# Patient Record
Sex: Female | Born: 1940 | Race: Black or African American | Hispanic: No | Smoking: Never smoker
Health system: Southern US, Community
[De-identification: ages and names within clinical notes are randomized; demographics above are authoritative.]

## PROBLEM LIST (undated history)

## (undated) DIAGNOSIS — IMO0001 Reserved for inherently not codable concepts without codable children: Secondary | ICD-10-CM

## (undated) DIAGNOSIS — I700 Atherosclerosis of aorta: Secondary | ICD-10-CM

## (undated) DIAGNOSIS — Z9289 Personal history of other medical treatment: Secondary | ICD-10-CM

## (undated) DIAGNOSIS — E1165 Type 2 diabetes mellitus with hyperglycemia: Secondary | ICD-10-CM

## (undated) DIAGNOSIS — E042 Nontoxic multinodular goiter: Secondary | ICD-10-CM

## (undated) DIAGNOSIS — R079 Chest pain, unspecified: Secondary | ICD-10-CM

## (undated) DIAGNOSIS — D649 Anemia, unspecified: Secondary | ICD-10-CM

## (undated) DIAGNOSIS — R519 Headache, unspecified: Secondary | ICD-10-CM

## (undated) DIAGNOSIS — I10 Essential (primary) hypertension: Secondary | ICD-10-CM

## (undated) DIAGNOSIS — Z923 Personal history of irradiation: Secondary | ICD-10-CM

## (undated) DIAGNOSIS — G4733 Obstructive sleep apnea (adult) (pediatric): Secondary | ICD-10-CM

## (undated) DIAGNOSIS — Z9989 Dependence on other enabling machines and devices: Secondary | ICD-10-CM

## (undated) DIAGNOSIS — R51 Headache: Secondary | ICD-10-CM

## (undated) DIAGNOSIS — I4510 Unspecified right bundle-branch block: Secondary | ICD-10-CM

## (undated) DIAGNOSIS — K219 Gastro-esophageal reflux disease without esophagitis: Secondary | ICD-10-CM

## (undated) DIAGNOSIS — C50919 Malignant neoplasm of unspecified site of unspecified female breast: Secondary | ICD-10-CM

## (undated) DIAGNOSIS — I739 Peripheral vascular disease, unspecified: Secondary | ICD-10-CM

## (undated) DIAGNOSIS — I2510 Atherosclerotic heart disease of native coronary artery without angina pectoris: Secondary | ICD-10-CM

## (undated) DIAGNOSIS — M1990 Unspecified osteoarthritis, unspecified site: Secondary | ICD-10-CM

## (undated) DIAGNOSIS — R911 Solitary pulmonary nodule: Secondary | ICD-10-CM

## (undated) DIAGNOSIS — G8929 Other chronic pain: Secondary | ICD-10-CM

## (undated) DIAGNOSIS — I779 Disorder of arteries and arterioles, unspecified: Secondary | ICD-10-CM

## (undated) DIAGNOSIS — E785 Hyperlipidemia, unspecified: Secondary | ICD-10-CM

## (undated) DIAGNOSIS — E0590 Thyrotoxicosis, unspecified without thyrotoxic crisis or storm: Secondary | ICD-10-CM

## (undated) DIAGNOSIS — E669 Obesity, unspecified: Secondary | ICD-10-CM

## (undated) HISTORY — PX: CARDIAC CATHETERIZATION: SHX172

## (undated) HISTORY — DX: Essential (primary) hypertension: I10

## (undated) HISTORY — DX: Nontoxic multinodular goiter: E04.2

## (undated) HISTORY — DX: Unspecified right bundle-branch block: I45.10

## (undated) HISTORY — PX: BREAST BIOPSY: SHX20

## (undated) HISTORY — DX: Hyperlipidemia, unspecified: E78.5

## (undated) HISTORY — DX: Obstructive sleep apnea (adult) (pediatric): G47.33

## (undated) HISTORY — DX: Gastro-esophageal reflux disease without esophagitis: K21.9

## (undated) HISTORY — DX: Unspecified osteoarthritis, unspecified site: M19.90

## (undated) HISTORY — DX: Dependence on other enabling machines and devices: Z99.89

## (undated) HISTORY — DX: Anemia, unspecified: D64.9

## (undated) HISTORY — DX: Malignant neoplasm of unspecified site of unspecified female breast: C50.919

## (undated) HISTORY — DX: Atherosclerosis of aorta: I70.0

## (undated) HISTORY — PX: BREAST EXCISIONAL BIOPSY: SUR124

## (undated) HISTORY — DX: Obesity, unspecified: E66.9

---

## 1997-03-20 DIAGNOSIS — Z923 Personal history of irradiation: Secondary | ICD-10-CM

## 1997-03-20 HISTORY — DX: Personal history of irradiation: Z92.3

## 1997-10-15 ENCOUNTER — Ambulatory Visit (HOSPITAL_COMMUNITY): Admission: RE | Admit: 1997-10-15 | Discharge: 1997-10-15 | Payer: Self-pay | Admitting: Endocrinology

## 1998-03-20 HISTORY — PX: MASTECTOMY: SHX3

## 1998-06-17 ENCOUNTER — Other Ambulatory Visit: Admission: RE | Admit: 1998-06-17 | Discharge: 1998-06-17 | Payer: Self-pay | Admitting: Obstetrics & Gynecology

## 1999-07-29 ENCOUNTER — Other Ambulatory Visit: Admission: RE | Admit: 1999-07-29 | Discharge: 1999-07-29 | Payer: Self-pay | Admitting: Obstetrics & Gynecology

## 1999-08-02 ENCOUNTER — Encounter: Admission: RE | Admit: 1999-08-02 | Discharge: 1999-08-02 | Payer: Self-pay | Admitting: Obstetrics & Gynecology

## 1999-08-02 ENCOUNTER — Encounter: Payer: Self-pay | Admitting: Obstetrics & Gynecology

## 1999-09-12 ENCOUNTER — Ambulatory Visit (HOSPITAL_COMMUNITY): Admission: RE | Admit: 1999-09-12 | Discharge: 1999-09-12 | Payer: Self-pay | Admitting: *Deleted

## 1999-09-12 ENCOUNTER — Encounter (INDEPENDENT_AMBULATORY_CARE_PROVIDER_SITE_OTHER): Payer: Self-pay

## 1999-11-28 ENCOUNTER — Encounter: Payer: Self-pay | Admitting: Internal Medicine

## 2000-03-06 ENCOUNTER — Encounter: Admission: RE | Admit: 2000-03-06 | Discharge: 2000-03-06 | Payer: Self-pay | Admitting: Internal Medicine

## 2000-03-06 ENCOUNTER — Encounter: Payer: Self-pay | Admitting: Internal Medicine

## 2000-04-18 ENCOUNTER — Encounter: Payer: Self-pay | Admitting: Endocrinology

## 2000-04-18 ENCOUNTER — Ambulatory Visit (HOSPITAL_COMMUNITY): Admission: RE | Admit: 2000-04-18 | Discharge: 2000-04-18 | Payer: Self-pay | Admitting: Endocrinology

## 2000-09-10 ENCOUNTER — Other Ambulatory Visit: Admission: RE | Admit: 2000-09-10 | Discharge: 2000-09-10 | Payer: Self-pay | Admitting: Obstetrics and Gynecology

## 2000-11-13 ENCOUNTER — Emergency Department (HOSPITAL_COMMUNITY): Admission: EM | Admit: 2000-11-13 | Discharge: 2000-11-13 | Payer: Self-pay | Admitting: Internal Medicine

## 2000-11-13 ENCOUNTER — Encounter: Payer: Self-pay | Admitting: Emergency Medicine

## 2001-04-19 ENCOUNTER — Encounter (INDEPENDENT_AMBULATORY_CARE_PROVIDER_SITE_OTHER): Payer: Self-pay | Admitting: Specialist

## 2001-04-19 ENCOUNTER — Ambulatory Visit (HOSPITAL_COMMUNITY): Admission: RE | Admit: 2001-04-19 | Discharge: 2001-04-19 | Payer: Self-pay | Admitting: *Deleted

## 2001-09-11 ENCOUNTER — Other Ambulatory Visit: Admission: RE | Admit: 2001-09-11 | Discharge: 2001-09-11 | Payer: Self-pay | Admitting: Obstetrics and Gynecology

## 2001-09-23 ENCOUNTER — Encounter: Admission: RE | Admit: 2001-09-23 | Discharge: 2001-09-23 | Payer: Self-pay | Admitting: Obstetrics and Gynecology

## 2001-09-23 ENCOUNTER — Encounter: Payer: Self-pay | Admitting: Obstetrics and Gynecology

## 2002-04-03 ENCOUNTER — Encounter: Payer: Self-pay | Admitting: Internal Medicine

## 2002-04-03 ENCOUNTER — Ambulatory Visit (HOSPITAL_COMMUNITY): Admission: RE | Admit: 2002-04-03 | Discharge: 2002-04-03 | Payer: Self-pay | Admitting: Internal Medicine

## 2002-09-16 ENCOUNTER — Other Ambulatory Visit: Admission: RE | Admit: 2002-09-16 | Discharge: 2002-09-16 | Payer: Self-pay | Admitting: Obstetrics and Gynecology

## 2003-02-23 ENCOUNTER — Ambulatory Visit (HOSPITAL_COMMUNITY): Admission: RE | Admit: 2003-02-23 | Discharge: 2003-02-23 | Payer: Self-pay | Admitting: *Deleted

## 2003-09-30 ENCOUNTER — Encounter: Admission: RE | Admit: 2003-09-30 | Discharge: 2003-09-30 | Payer: Self-pay | Admitting: Obstetrics and Gynecology

## 2004-02-03 ENCOUNTER — Ambulatory Visit (HOSPITAL_COMMUNITY): Admission: RE | Admit: 2004-02-03 | Discharge: 2004-02-03 | Payer: Self-pay | Admitting: Internal Medicine

## 2004-04-12 ENCOUNTER — Ambulatory Visit (HOSPITAL_COMMUNITY): Admission: RE | Admit: 2004-04-12 | Discharge: 2004-04-12 | Payer: Self-pay | Admitting: *Deleted

## 2004-09-21 ENCOUNTER — Other Ambulatory Visit: Admission: RE | Admit: 2004-09-21 | Discharge: 2004-09-21 | Payer: Self-pay | Admitting: Obstetrics and Gynecology

## 2004-12-02 ENCOUNTER — Ambulatory Visit (HOSPITAL_COMMUNITY): Admission: RE | Admit: 2004-12-02 | Discharge: 2004-12-02 | Payer: Self-pay | Admitting: *Deleted

## 2005-01-25 ENCOUNTER — Ambulatory Visit (HOSPITAL_COMMUNITY): Admission: RE | Admit: 2005-01-25 | Discharge: 2005-01-25 | Payer: Self-pay | Admitting: Internal Medicine

## 2005-02-28 ENCOUNTER — Other Ambulatory Visit: Admission: RE | Admit: 2005-02-28 | Discharge: 2005-02-28 | Payer: Self-pay | Admitting: Interventional Radiology

## 2005-02-28 ENCOUNTER — Encounter: Admission: RE | Admit: 2005-02-28 | Discharge: 2005-02-28 | Payer: Self-pay | Admitting: Internal Medicine

## 2005-02-28 ENCOUNTER — Encounter (INDEPENDENT_AMBULATORY_CARE_PROVIDER_SITE_OTHER): Payer: Self-pay | Admitting: *Deleted

## 2005-04-21 ENCOUNTER — Encounter: Admission: RE | Admit: 2005-04-21 | Discharge: 2005-04-21 | Payer: Self-pay | Admitting: Obstetrics and Gynecology

## 2005-08-17 ENCOUNTER — Ambulatory Visit (HOSPITAL_COMMUNITY): Admission: RE | Admit: 2005-08-17 | Discharge: 2005-08-17 | Payer: Self-pay | Admitting: Internal Medicine

## 2005-09-28 ENCOUNTER — Other Ambulatory Visit: Admission: RE | Admit: 2005-09-28 | Discharge: 2005-09-28 | Payer: Self-pay | Admitting: Obstetrics and Gynecology

## 2005-10-02 ENCOUNTER — Ambulatory Visit (HOSPITAL_COMMUNITY): Admission: RE | Admit: 2005-10-02 | Discharge: 2005-10-02 | Payer: Self-pay | Admitting: Obstetrics and Gynecology

## 2005-10-03 ENCOUNTER — Encounter (INDEPENDENT_AMBULATORY_CARE_PROVIDER_SITE_OTHER): Payer: Self-pay | Admitting: Specialist

## 2005-10-03 ENCOUNTER — Encounter: Admission: RE | Admit: 2005-10-03 | Discharge: 2005-10-03 | Payer: Self-pay | Admitting: Endocrinology

## 2005-10-03 ENCOUNTER — Other Ambulatory Visit: Admission: RE | Admit: 2005-10-03 | Discharge: 2005-10-03 | Payer: Self-pay | Admitting: Diagnostic Radiology

## 2005-10-09 ENCOUNTER — Encounter: Admission: RE | Admit: 2005-10-09 | Discharge: 2005-10-09 | Payer: Self-pay | Admitting: Obstetrics and Gynecology

## 2005-11-27 ENCOUNTER — Encounter: Admission: RE | Admit: 2005-11-27 | Discharge: 2005-11-27 | Payer: Self-pay | Admitting: Internal Medicine

## 2006-01-24 ENCOUNTER — Encounter: Admission: RE | Admit: 2006-01-24 | Discharge: 2006-01-24 | Payer: Self-pay | Admitting: Internal Medicine

## 2006-04-23 ENCOUNTER — Encounter: Admission: RE | Admit: 2006-04-23 | Discharge: 2006-04-23 | Payer: Self-pay | Admitting: Obstetrics and Gynecology

## 2006-10-29 ENCOUNTER — Encounter: Admission: RE | Admit: 2006-10-29 | Discharge: 2006-10-29 | Payer: Self-pay | Admitting: Internal Medicine

## 2007-05-07 ENCOUNTER — Encounter: Admission: RE | Admit: 2007-05-07 | Discharge: 2007-05-07 | Payer: Self-pay | Admitting: Obstetrics and Gynecology

## 2007-07-09 ENCOUNTER — Ambulatory Visit (HOSPITAL_COMMUNITY): Admission: RE | Admit: 2007-07-09 | Discharge: 2007-07-09 | Payer: Self-pay | Admitting: *Deleted

## 2007-07-23 ENCOUNTER — Ambulatory Visit (HOSPITAL_BASED_OUTPATIENT_CLINIC_OR_DEPARTMENT_OTHER): Admission: RE | Admit: 2007-07-23 | Discharge: 2007-07-23 | Payer: Self-pay | Admitting: Orthopedic Surgery

## 2007-07-29 ENCOUNTER — Encounter: Admission: RE | Admit: 2007-07-29 | Discharge: 2007-07-29 | Payer: Self-pay | Admitting: Internal Medicine

## 2007-08-15 ENCOUNTER — Encounter (INDEPENDENT_AMBULATORY_CARE_PROVIDER_SITE_OTHER): Payer: Self-pay | Admitting: Interventional Radiology

## 2007-08-15 ENCOUNTER — Other Ambulatory Visit: Admission: RE | Admit: 2007-08-15 | Discharge: 2007-08-15 | Payer: Self-pay | Admitting: Interventional Radiology

## 2007-08-15 ENCOUNTER — Encounter: Payer: Self-pay | Admitting: Internal Medicine

## 2007-08-15 ENCOUNTER — Encounter: Admission: RE | Admit: 2007-08-15 | Discharge: 2007-08-15 | Payer: Self-pay | Admitting: Internal Medicine

## 2008-02-06 ENCOUNTER — Emergency Department (HOSPITAL_COMMUNITY): Admission: EM | Admit: 2008-02-06 | Discharge: 2008-02-06 | Payer: Self-pay | Admitting: Emergency Medicine

## 2008-02-07 ENCOUNTER — Inpatient Hospital Stay (HOSPITAL_COMMUNITY): Admission: EM | Admit: 2008-02-07 | Discharge: 2008-02-11 | Payer: Self-pay | Admitting: Emergency Medicine

## 2008-05-08 ENCOUNTER — Encounter: Admission: RE | Admit: 2008-05-08 | Discharge: 2008-05-08 | Payer: Self-pay | Admitting: Obstetrics and Gynecology

## 2008-05-22 ENCOUNTER — Encounter: Payer: Self-pay | Admitting: Internal Medicine

## 2008-08-14 ENCOUNTER — Encounter: Payer: Self-pay | Admitting: Internal Medicine

## 2008-08-18 ENCOUNTER — Encounter: Payer: Self-pay | Admitting: Internal Medicine

## 2008-10-27 ENCOUNTER — Encounter: Admission: RE | Admit: 2008-10-27 | Discharge: 2008-10-27 | Payer: Self-pay | Admitting: Endocrinology

## 2008-10-30 ENCOUNTER — Ambulatory Visit (HOSPITAL_COMMUNITY): Admission: RE | Admit: 2008-10-30 | Discharge: 2008-10-30 | Payer: Self-pay | Admitting: Rheumatology

## 2008-11-12 ENCOUNTER — Ambulatory Visit: Payer: Self-pay | Admitting: Internal Medicine

## 2008-11-12 DIAGNOSIS — N289 Disorder of kidney and ureter, unspecified: Secondary | ICD-10-CM | POA: Insufficient documentation

## 2008-11-12 DIAGNOSIS — E1169 Type 2 diabetes mellitus with other specified complication: Secondary | ICD-10-CM | POA: Insufficient documentation

## 2008-11-12 DIAGNOSIS — R911 Solitary pulmonary nodule: Secondary | ICD-10-CM | POA: Insufficient documentation

## 2008-11-12 DIAGNOSIS — E119 Type 2 diabetes mellitus without complications: Secondary | ICD-10-CM | POA: Insufficient documentation

## 2008-11-12 DIAGNOSIS — E118 Type 2 diabetes mellitus with unspecified complications: Secondary | ICD-10-CM | POA: Insufficient documentation

## 2008-11-12 DIAGNOSIS — K219 Gastro-esophageal reflux disease without esophagitis: Secondary | ICD-10-CM | POA: Insufficient documentation

## 2008-11-12 DIAGNOSIS — M1990 Unspecified osteoarthritis, unspecified site: Secondary | ICD-10-CM | POA: Insufficient documentation

## 2008-11-12 DIAGNOSIS — E785 Hyperlipidemia, unspecified: Secondary | ICD-10-CM | POA: Insufficient documentation

## 2008-11-12 DIAGNOSIS — I10 Essential (primary) hypertension: Secondary | ICD-10-CM | POA: Insufficient documentation

## 2008-11-12 LAB — CONVERTED CEMR LAB
BUN: 30 mg/dL — ABNORMAL HIGH (ref 6–23)
CO2: 28 meq/L (ref 19–32)
Calcium: 9.9 mg/dL (ref 8.4–10.5)
Chloride: 102 meq/L (ref 96–112)
Creatinine, Ser: 1 mg/dL (ref 0.4–1.2)
GFR calc non Af Amer: 71.29 mL/min (ref >60)
Glucose, Bld: 150 mg/dL — ABNORMAL HIGH (ref 70–99)
Potassium: 4.4 meq/L (ref 3.5–5.1)
Sodium: 138 meq/L (ref 135–145)

## 2008-11-17 ENCOUNTER — Encounter: Payer: Self-pay | Admitting: Internal Medicine

## 2008-12-04 ENCOUNTER — Ambulatory Visit: Payer: Self-pay | Admitting: Oncology

## 2008-12-07 ENCOUNTER — Ambulatory Visit: Payer: Self-pay | Admitting: Cardiology

## 2008-12-10 ENCOUNTER — Ambulatory Visit: Payer: Self-pay | Admitting: Internal Medicine

## 2009-01-05 ENCOUNTER — Ambulatory Visit: Payer: Self-pay | Admitting: Pulmonary Disease

## 2009-01-05 DIAGNOSIS — G4733 Obstructive sleep apnea (adult) (pediatric): Secondary | ICD-10-CM | POA: Insufficient documentation

## 2009-01-11 ENCOUNTER — Ambulatory Visit: Payer: Self-pay | Admitting: Oncology

## 2009-01-14 LAB — CBC & DIFF AND RETIC
BASO%: 0.4 % (ref 0.0–2.0)
Basophils Absolute: 0 10*3/uL (ref 0.0–0.1)
EOS%: 0.4 % (ref 0.0–7.0)
Eosinophils Absolute: 0 10*3/uL (ref 0.0–0.5)
HCT: 29.2 % — ABNORMAL LOW (ref 34.8–46.6)
HGB: 9.3 g/dL — ABNORMAL LOW (ref 11.6–15.9)
Immature Retic Fract: 14.2 % — ABNORMAL HIGH (ref 0.00–10.70)
LYMPH%: 29 % (ref 14.0–49.7)
MCH: 24.8 pg — ABNORMAL LOW (ref 25.1–34.0)
MCHC: 31.8 g/dL (ref 31.5–36.0)
MCV: 77.9 fL — ABNORMAL LOW (ref 79.5–101.0)
MONO#: 0.5 10*3/uL (ref 0.1–0.9)
MONO%: 8.2 % (ref 0.0–14.0)
NEUT#: 3.4 10*3/uL (ref 1.5–6.5)
NEUT%: 62 % (ref 38.4–76.8)
Platelets: 289 10*3/uL (ref 145–400)
RBC: 3.75 10*6/uL (ref 3.70–5.45)
RDW: 16.7 % — ABNORMAL HIGH (ref 11.2–14.5)
Retic %: 0.99 % (ref 0.50–1.50)
Retic Ct Abs: 37.13 10*3/uL (ref 18.30–72.70)
WBC: 5.5 10*3/uL (ref 3.9–10.3)
lymph#: 1.6 10*3/uL (ref 0.9–3.3)

## 2009-01-14 LAB — COMPREHENSIVE METABOLIC PANEL
ALT: 18 U/L (ref 0–35)
AST: 21 U/L (ref 0–37)
Albumin: 4 g/dL (ref 3.5–5.2)
Alkaline Phosphatase: 76 U/L (ref 39–117)
BUN: 18 mg/dL (ref 6–23)
CO2: 29 mEq/L (ref 19–32)
Calcium: 10.4 mg/dL (ref 8.4–10.5)
Chloride: 107 mEq/L (ref 96–112)
Creatinine, Ser: 0.91 mg/dL (ref 0.40–1.20)
Glucose, Bld: 70 mg/dL (ref 70–99)
Potassium: 3.6 mEq/L (ref 3.5–5.3)
Sodium: 143 mEq/L (ref 135–145)
Total Bilirubin: 0.6 mg/dL (ref 0.3–1.2)
Total Protein: 7.5 g/dL (ref 6.0–8.3)

## 2009-01-14 LAB — LACTATE DEHYDROGENASE: LDH: 164 U/L (ref 94–250)

## 2009-01-14 LAB — CHCC SMEAR

## 2009-01-18 LAB — IMMUNOFIXATION ELECTROPHORESIS
IgA: 227 mg/dL (ref 68–378)
IgG (Immunoglobin G), Serum: 1170 mg/dL (ref 694–1618)
IgM, Serum: 49 mg/dL — ABNORMAL LOW (ref 60–263)
Total Protein, Serum Electrophoresis: 7.3 g/dL (ref 6.0–8.3)

## 2009-01-18 LAB — IRON AND TIBC
%SAT: 5 % — ABNORMAL LOW (ref 20–55)
Iron: 19 ug/dL — ABNORMAL LOW (ref 42–145)
TIBC: 412 ug/dL (ref 250–470)
UIBC: 393 ug/dL

## 2009-01-18 LAB — FERRITIN: Ferritin: 9 ng/mL — ABNORMAL LOW (ref 10–291)

## 2009-01-18 LAB — TRANSFERRIN RECEPTOR, SOLUABLE: Transferrin Receptor, Soluble: 45.5 nmol/L

## 2009-02-01 LAB — FECAL OCCULT BLOOD, GUAIAC: Occult Blood: NEGATIVE

## 2009-02-15 ENCOUNTER — Encounter: Payer: Self-pay | Admitting: Pulmonary Disease

## 2009-02-17 ENCOUNTER — Ambulatory Visit: Payer: Self-pay | Admitting: Oncology

## 2009-02-19 LAB — CBC WITH DIFFERENTIAL/PLATELET
BASO%: 0.2 % (ref 0.0–2.0)
Basophils Absolute: 0 10*3/uL (ref 0.0–0.1)
EOS%: 0.7 % (ref 0.0–7.0)
Eosinophils Absolute: 0 10*3/uL (ref 0.0–0.5)
HCT: 31.1 % — ABNORMAL LOW (ref 34.8–46.6)
HGB: 9.8 g/dL — ABNORMAL LOW (ref 11.6–15.9)
LYMPH%: 30.8 % (ref 14.0–49.7)
MCH: 25.2 pg (ref 25.1–34.0)
MCHC: 31.5 g/dL (ref 31.5–36.0)
MCV: 79.9 fL (ref 79.5–101.0)
MONO#: 0.4 10*3/uL (ref 0.1–0.9)
MONO%: 6.2 % (ref 0.0–14.0)
NEUT#: 3.5 10*3/uL (ref 1.5–6.5)
NEUT%: 62.1 % (ref 38.4–76.8)
Platelets: 298 10*3/uL (ref 145–400)
RBC: 3.89 10*6/uL (ref 3.70–5.45)
RDW: 19 % — ABNORMAL HIGH (ref 11.2–14.5)
WBC: 5.7 10*3/uL (ref 3.9–10.3)
lymph#: 1.8 10*3/uL (ref 0.9–3.3)

## 2009-02-19 LAB — SEDIMENTATION RATE: Sed Rate: 28 mm/hr — ABNORMAL HIGH (ref 0–22)

## 2009-02-24 ENCOUNTER — Encounter: Payer: Self-pay | Admitting: Pulmonary Disease

## 2009-02-25 ENCOUNTER — Ambulatory Visit (HOSPITAL_BASED_OUTPATIENT_CLINIC_OR_DEPARTMENT_OTHER): Admission: RE | Admit: 2009-02-25 | Discharge: 2009-02-25 | Payer: Self-pay | Admitting: Pulmonary Disease

## 2009-02-25 ENCOUNTER — Encounter: Payer: Self-pay | Admitting: Pulmonary Disease

## 2009-03-04 ENCOUNTER — Ambulatory Visit: Payer: Self-pay | Admitting: Pulmonary Disease

## 2009-03-08 ENCOUNTER — Ambulatory Visit: Payer: Self-pay | Admitting: Internal Medicine

## 2009-03-23 ENCOUNTER — Ambulatory Visit (HOSPITAL_COMMUNITY): Admission: RE | Admit: 2009-03-23 | Discharge: 2009-03-23 | Payer: Self-pay | Admitting: Rheumatology

## 2009-03-31 ENCOUNTER — Ambulatory Visit: Payer: Self-pay | Admitting: Oncology

## 2009-04-02 LAB — CBC WITH DIFFERENTIAL/PLATELET
BASO%: 0.2 % (ref 0.0–2.0)
Basophils Absolute: 0 10*3/uL (ref 0.0–0.1)
EOS%: 1.8 % (ref 0.0–7.0)
Eosinophils Absolute: 0.1 10*3/uL (ref 0.0–0.5)
HCT: 34.6 % — ABNORMAL LOW (ref 34.8–46.6)
HGB: 11 g/dL — ABNORMAL LOW (ref 11.6–15.9)
LYMPH%: 34.2 % (ref 14.0–49.7)
MCH: 26.9 pg (ref 25.1–34.0)
MCHC: 31.8 g/dL (ref 31.5–36.0)
MCV: 84.6 fL (ref 79.5–101.0)
MONO#: 0.5 10*3/uL (ref 0.1–0.9)
MONO%: 9 % (ref 0.0–14.0)
NEUT#: 2.8 10*3/uL (ref 1.5–6.5)
NEUT%: 54.8 % (ref 38.4–76.8)
Platelets: 291 10*3/uL (ref 145–400)
RBC: 4.09 10*6/uL (ref 3.70–5.45)
RDW: 20.3 % — ABNORMAL HIGH (ref 11.2–14.5)
WBC: 5.1 10*3/uL (ref 3.9–10.3)
lymph#: 1.8 10*3/uL (ref 0.9–3.3)

## 2009-04-06 LAB — FERRITIN: Ferritin: 15 ng/mL (ref 10–291)

## 2009-04-06 LAB — IRON AND TIBC
%SAT: 15 % — ABNORMAL LOW (ref 20–55)
Iron: 61 ug/dL (ref 42–145)
TIBC: 397 ug/dL (ref 250–470)
UIBC: 336 ug/dL

## 2009-04-06 LAB — TRANSFERRIN RECEPTOR, SOLUABLE: Transferrin Receptor, Soluble: 32.4 nmol/L

## 2009-05-10 ENCOUNTER — Encounter: Admission: RE | Admit: 2009-05-10 | Discharge: 2009-05-10 | Payer: Self-pay | Admitting: Obstetrics and Gynecology

## 2009-05-18 ENCOUNTER — Encounter: Payer: Self-pay | Admitting: Internal Medicine

## 2009-05-26 ENCOUNTER — Ambulatory Visit: Payer: Self-pay | Admitting: Oncology

## 2009-05-28 LAB — CBC WITH DIFFERENTIAL/PLATELET
BASO%: 0.2 % (ref 0.0–2.0)
Basophils Absolute: 0 10*3/uL (ref 0.0–0.1)
EOS%: 0.7 % (ref 0.0–7.0)
Eosinophils Absolute: 0 10*3/uL (ref 0.0–0.5)
HCT: 34.4 % — ABNORMAL LOW (ref 34.8–46.6)
HGB: 11.2 g/dL — ABNORMAL LOW (ref 11.6–15.9)
LYMPH%: 30.7 % (ref 14.0–49.7)
MCH: 28.4 pg (ref 25.1–34.0)
MCHC: 32.6 g/dL (ref 31.5–36.0)
MCV: 87.1 fL (ref 79.5–101.0)
MONO#: 0.4 10*3/uL (ref 0.1–0.9)
MONO%: 7 % (ref 0.0–14.0)
NEUT#: 3.5 10*3/uL (ref 1.5–6.5)
NEUT%: 61.4 % (ref 38.4–76.8)
Platelets: 261 10*3/uL (ref 145–400)
RBC: 3.95 10*6/uL (ref 3.70–5.45)
RDW: 16.4 % — ABNORMAL HIGH (ref 11.2–14.5)
WBC: 5.6 10*3/uL (ref 3.9–10.3)
lymph#: 1.7 10*3/uL (ref 0.9–3.3)
nRBC: 0 % (ref 0–0)

## 2009-05-28 LAB — IRON AND TIBC
%SAT: 15 % — ABNORMAL LOW (ref 20–55)
Iron: 61 ug/dL (ref 42–145)
TIBC: 411 ug/dL (ref 250–470)
UIBC: 350 ug/dL

## 2009-05-28 LAB — FERRITIN: Ferritin: 19 ng/mL (ref 10–291)

## 2009-06-08 ENCOUNTER — Ambulatory Visit: Payer: Self-pay | Admitting: Pulmonary Disease

## 2009-06-15 ENCOUNTER — Encounter: Payer: Self-pay | Admitting: Internal Medicine

## 2009-06-23 ENCOUNTER — Ambulatory Visit: Payer: Self-pay | Admitting: Cardiology

## 2009-07-07 ENCOUNTER — Ambulatory Visit (HOSPITAL_COMMUNITY): Admission: RE | Admit: 2009-07-07 | Discharge: 2009-07-07 | Payer: Self-pay | Admitting: Internal Medicine

## 2009-07-21 ENCOUNTER — Ambulatory Visit: Payer: Self-pay | Admitting: Internal Medicine

## 2009-07-22 ENCOUNTER — Ambulatory Visit: Payer: Self-pay | Admitting: Oncology

## 2009-07-23 LAB — COMPREHENSIVE METABOLIC PANEL
ALT: 17 U/L (ref 0–35)
AST: 17 U/L (ref 0–37)
Albumin: 4.3 g/dL (ref 3.5–5.2)
Alkaline Phosphatase: 94 U/L (ref 39–117)
BUN: 27 mg/dL — ABNORMAL HIGH (ref 6–23)
CO2: 25 mEq/L (ref 19–32)
Calcium: 10.3 mg/dL (ref 8.4–10.5)
Chloride: 104 mEq/L (ref 96–112)
Creatinine, Ser: 0.92 mg/dL (ref 0.40–1.20)
Glucose, Bld: 112 mg/dL — ABNORMAL HIGH (ref 70–99)
Potassium: 4.7 mEq/L (ref 3.5–5.3)
Sodium: 141 mEq/L (ref 135–145)
Total Bilirubin: 0.4 mg/dL (ref 0.3–1.2)
Total Protein: 7.5 g/dL (ref 6.0–8.3)

## 2009-07-23 LAB — IRON AND TIBC
%SAT: 20 % (ref 20–55)
Iron: 70 ug/dL (ref 42–145)
TIBC: 358 ug/dL (ref 250–470)
UIBC: 288 ug/dL

## 2009-07-23 LAB — CBC WITH DIFFERENTIAL/PLATELET
BASO%: 0.2 % (ref 0.0–2.0)
Basophils Absolute: 0 10*3/uL (ref 0.0–0.1)
EOS%: 4.5 % (ref 0.0–7.0)
Eosinophils Absolute: 0.3 10*3/uL (ref 0.0–0.5)
HCT: 35.4 % (ref 34.8–46.6)
HGB: 11.4 g/dL — ABNORMAL LOW (ref 11.6–15.9)
LYMPH%: 25 % (ref 14.0–49.7)
MCH: 28.3 pg (ref 25.1–34.0)
MCHC: 32.2 g/dL (ref 31.5–36.0)
MCV: 87.8 fL (ref 79.5–101.0)
MONO#: 0.4 10*3/uL (ref 0.1–0.9)
MONO%: 7 % (ref 0.0–14.0)
NEUT#: 3.7 10*3/uL (ref 1.5–6.5)
NEUT%: 63.3 % (ref 38.4–76.8)
Platelets: 273 10*3/uL (ref 145–400)
RBC: 4.03 10*6/uL (ref 3.70–5.45)
RDW: 14.9 % — ABNORMAL HIGH (ref 11.2–14.5)
WBC: 5.8 10*3/uL (ref 3.9–10.3)
lymph#: 1.5 10*3/uL (ref 0.9–3.3)
nRBC: 0 % (ref 0–0)

## 2009-07-23 LAB — FERRITIN: Ferritin: 33 ng/mL (ref 10–291)

## 2009-07-23 LAB — LACTATE DEHYDROGENASE: LDH: 225 U/L (ref 94–250)

## 2009-09-21 ENCOUNTER — Ambulatory Visit: Payer: Self-pay | Admitting: Oncology

## 2009-09-23 LAB — CBC WITH DIFFERENTIAL/PLATELET
BASO%: 0.2 % (ref 0.0–2.0)
Basophils Absolute: 0 10*3/uL (ref 0.0–0.1)
EOS%: 2.7 % (ref 0.0–7.0)
Eosinophils Absolute: 0.1 10*3/uL (ref 0.0–0.5)
HCT: 35.8 % (ref 34.8–46.6)
HGB: 11.6 g/dL (ref 11.6–15.9)
LYMPH%: 30.5 % (ref 14.0–49.7)
MCH: 28.4 pg (ref 25.1–34.0)
MCHC: 32.4 g/dL (ref 31.5–36.0)
MCV: 87.5 fL (ref 79.5–101.0)
MONO#: 0.5 10*3/uL (ref 0.1–0.9)
MONO%: 8.8 % (ref 0.0–14.0)
NEUT#: 3 10*3/uL (ref 1.5–6.5)
NEUT%: 57.8 % (ref 38.4–76.8)
Platelets: 258 10*3/uL (ref 145–400)
RBC: 4.09 10*6/uL (ref 3.70–5.45)
RDW: 14.9 % — ABNORMAL HIGH (ref 11.2–14.5)
WBC: 5.2 10*3/uL (ref 3.9–10.3)
lymph#: 1.6 10*3/uL (ref 0.9–3.3)
nRBC: 0 % (ref 0–0)

## 2009-09-23 LAB — IRON AND TIBC
%SAT: 19 % — ABNORMAL LOW (ref 20–55)
Iron: 70 ug/dL (ref 42–145)
TIBC: 371 ug/dL (ref 250–470)
UIBC: 301 ug/dL

## 2009-09-23 LAB — FERRITIN: Ferritin: 30 ng/mL (ref 10–291)

## 2009-11-17 ENCOUNTER — Ambulatory Visit: Payer: Self-pay | Admitting: Pulmonary Disease

## 2009-11-17 DIAGNOSIS — E669 Obesity, unspecified: Secondary | ICD-10-CM | POA: Insufficient documentation

## 2009-11-18 ENCOUNTER — Ambulatory Visit: Payer: Self-pay | Admitting: Oncology

## 2009-11-23 LAB — CBC WITH DIFFERENTIAL/PLATELET
BASO%: 0.3 % (ref 0.0–2.0)
Basophils Absolute: 0 10*3/uL (ref 0.0–0.1)
EOS%: 1.5 % (ref 0.0–7.0)
Eosinophils Absolute: 0.1 10*3/uL (ref 0.0–0.5)
HCT: 33.4 % — ABNORMAL LOW (ref 34.8–46.6)
HGB: 11.2 g/dL — ABNORMAL LOW (ref 11.6–15.9)
LYMPH%: 30 % (ref 14.0–49.7)
MCH: 29.5 pg (ref 25.1–34.0)
MCHC: 33.7 g/dL (ref 31.5–36.0)
MCV: 87.7 fL (ref 79.5–101.0)
MONO#: 0.3 10*3/uL (ref 0.1–0.9)
MONO%: 7.1 % (ref 0.0–14.0)
NEUT#: 2.9 10*3/uL (ref 1.5–6.5)
NEUT%: 61.1 % (ref 38.4–76.8)
Platelets: 263 10*3/uL (ref 145–400)
RBC: 3.81 10*6/uL (ref 3.70–5.45)
RDW: 15.1 % — ABNORMAL HIGH (ref 11.2–14.5)
WBC: 4.7 10*3/uL (ref 3.9–10.3)
lymph#: 1.4 10*3/uL (ref 0.9–3.3)

## 2009-11-23 LAB — COMPREHENSIVE METABOLIC PANEL
ALT: 10 U/L (ref 0–35)
AST: 14 U/L (ref 0–37)
Albumin: 4.1 g/dL (ref 3.5–5.2)
Alkaline Phosphatase: 74 U/L (ref 39–117)
BUN: 21 mg/dL (ref 6–23)
CO2: 27 mEq/L (ref 19–32)
Calcium: 9.9 mg/dL (ref 8.4–10.5)
Chloride: 104 mEq/L (ref 96–112)
Creatinine, Ser: 0.95 mg/dL (ref 0.40–1.20)
Glucose, Bld: 91 mg/dL (ref 70–99)
Potassium: 3.9 mEq/L (ref 3.5–5.3)
Sodium: 139 mEq/L (ref 135–145)
Total Bilirubin: 0.4 mg/dL (ref 0.3–1.2)
Total Protein: 6.8 g/dL (ref 6.0–8.3)

## 2009-11-23 LAB — IRON AND TIBC
%SAT: 19 % — ABNORMAL LOW (ref 20–55)
Iron: 65 ug/dL (ref 42–145)
TIBC: 350 ug/dL (ref 250–470)
UIBC: 285 ug/dL

## 2009-11-23 LAB — FERRITIN: Ferritin: 22 ng/mL (ref 10–291)

## 2009-11-23 LAB — LACTATE DEHYDROGENASE: LDH: 179 U/L (ref 94–250)

## 2010-01-04 ENCOUNTER — Ambulatory Visit (HOSPITAL_BASED_OUTPATIENT_CLINIC_OR_DEPARTMENT_OTHER): Admission: RE | Admit: 2010-01-04 | Discharge: 2010-01-04 | Payer: Self-pay | Admitting: Orthopedic Surgery

## 2010-02-25 ENCOUNTER — Ambulatory Visit: Payer: Self-pay | Admitting: Internal Medicine

## 2010-03-01 ENCOUNTER — Encounter: Payer: Self-pay | Admitting: Internal Medicine

## 2010-04-09 ENCOUNTER — Other Ambulatory Visit: Payer: Self-pay | Admitting: Obstetrics and Gynecology

## 2010-04-09 DIAGNOSIS — Z1239 Encounter for other screening for malignant neoplasm of breast: Secondary | ICD-10-CM

## 2010-04-10 ENCOUNTER — Encounter: Payer: Self-pay | Admitting: Internal Medicine

## 2010-04-11 ENCOUNTER — Encounter: Payer: Self-pay | Admitting: Internal Medicine

## 2010-04-21 NOTE — Assessment & Plan Note (Signed)
Summary: review PET scan//jrc   Visit Type:  Follow-up Copy to:  Brand Males Primary Provider/Referring Provider:  Dr. Deland Pretty  CC:  pt here to discuss PET scna results. .  History of Present Illness: OV 07/21/2009: Followup LUL  Pulmonary nodule in 70 year old ex-passive smoker with prior breast cancer. First noted 08/14/2008 as incidental finding on a [facility] reserach study scan. Confirmed as unchanged 12/07/2008 (4th month scan). Unchanged on followup CT 03/08/2009 but also present was subcentimeter mediastinal nodes (7th month scan). Had another followup CT chest 06/23/2009 (11th month scan). This showed enlargement of the LUL nodule to 1.3cm (in retrospect dec 2010 nodule measured at 1.2cm) but unchanged mediastoinal nodes. PET scan done 07/07/2009 shows NO UPTAKE in mediastinum or LUL nodule.   In terms of symptoms she is asymptomatic. No complaints.   Current Medications (verified): 1)  Caduet 10-80 Mg Tabs (Amlodipine-Atorvastatin) .... Take 1 Tablet By Mouth Once A Day 2)  Zetia 10 Mg Tabs (Ezetimibe) .... Take 1 Tablet By Mouth Once A Day 3)  Lisinopril-Hydrochlorothiazide 20-12.5 Mg Tabs (Lisinopril-Hydrochlorothiazide) .... Take 1 Tablet By Mouth Once A Day 4)  Aspirin 81 Mg Tbec (Aspirin) .... Take 1 Tablet By Mouth Once A Day 5)  Glucotrol Xl 10 Mg Xr24h-Tab (Glipizide) .... Take 1 Tablet By Mouth Two Times A Day 6)  Actoplus Met 15-850 Mg Tabs (Pioglitazone Hcl-Metformin Hcl) .... Take 1 Tablet By Mouth Two Times A Day 7)  Omeprazole 20 Mg Cpdr (Omeprazole) .... Take 1 Tablet By Mouth Once A Day 8)  Osteo Bi-Flex Joint Shield  Tabs (Misc Natural Products) .... Take 1 Tablet By Mouth Two Times A Day 9)  Calcium-Vitamin D 1200-400 Mg-Unit Tab .... Take 1 Tablet By Mouth Two Times A Day 10)  Tylenol 325 Mg Tabs (Acetaminophen) .... Per Bottle As Needed 11)  Fish Oil 1000 Mg Caps (Omega-3 Fatty Acids) .... Take 1 Tablet By Mouth Once A Day 12)  Miralax  Powd (Polyethylene Glycol  3350) .... 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  Walks occ. for exercise PASSIVE SMOKING - brith through marriage -> her mom smoked. 71 years of marriage - husband smoked heavily  Risk Factors: Smoking Status: never (01/05/2009)  Past Medical History: Diabetes, Type 2 Hyperlipidemia Hypertension Obstructive Sleep Apnea      - CPAP 15 cm H2O Multinodular Goiter with calcification with Left > Right on CT April 2011 with contralateral tracheal deviation Breast cancer s/p left mastectomy Chronic Anemia Atherosclerosis of aorta Obesity G E R D Right Bundle Branch Block Osteoarthritis Superior end plate compression fracture at L5 with normal SPEP-2009  Past Surgical History: Reviewed history from 11/12/2008 and no changes required. Mastectomy-left Tubal Ligation  Family History: Reviewed history from 11/12/2008 and no changes required. Mother-MI at age 30 Father-deceased at 55, unknown causes 41 bothers,three sisters 1 Sister-Diabetes 1Brother-diabetes 2 Brother- diabetes 3 Brother-diabetes, HTN, heart disease, stroke  Social History: Reviewed history from 11/12/2008 and no changes required. 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  Walks occ. for exercise PASSIVE SMOKING - brith through marriage -> her mom smoked. 29 years of marriage - husband smoked heavily  Review of Systems  The patient denies shortness of breath with activity, shortness of breath at rest, productive cough, non-productive cough, coughing up blood, chest pain, irregular  heartbeats, acid heartburn, indigestion, loss of appetite, weight change, abdominal pain, difficulty swallowing, sore throat, tooth/dental problems, headaches, nasal congestion/difficulty breathing through nose, sneezing, itching, ear ache, anxiety, depression, hand/feet swelling, joint stiffness or pain, rash, change in color of mucus, and fever.    Vital Signs:  Patient profile:   70 year old female Height:      62 inches Weight:      196 pounds O2 Sat:      96 % on Room air Temp:     97.6 degrees F oral Pulse rate:   77 / minute BP sitting:   120 / 72  (right arm) Cuff size:   regular  Vitals Entered By: [place] Bing CMA (Jul 21, 2009 3:22 PM)  O2 Flow:  Room air CC: pt here to discuss PET scna results.  Comments Medications reviewed with patient [place] Bing CMA  Jul 21, 2009 3:24 PM Daytime phone number verified with patient.    Physical Exam  General:  obese.   Head:  normocephalic and atraumatic Eyes:  PERRLA/EOM intact; conjunctiva and sclera clear Ears:  TMs intact and clear with normal canals Nose:  no deformity, discharge, inflammation, or lesions Mouth:  MP 3, no oral lesions Neck:  no JVD.   Chest Wall:  no deformities noted Lungs:  clear bilaterally to auscultation and percussion Heart:  regular rate and rhythm, S1, S2 without murmurs, rubs, gallops, or clicks Abdomen:  bowel sounds positive; abdomen soft and non-tender without masses, or organomegaly Msk:  no deformity or scoliosis noted with normal posture Pulses:  pulses normal Extremities:  no clubbing, cyanosis, edema, or deformity noted Neurologic:  CN II-XII grossly intact with normal reflexes, coordination, muscle strength and tone Skin:  intact without lesions or rashes Cervical Nodes:  no significant adenopathy Axillary Nodes:  no significant adenopathy Psych:  alert and cooperative; normal mood and affect; normal attention span and concentration   CT of Chest  Procedure date:   06/23/2009  Findings:      First noted 08/14/2008 as incidental finding on a [facility] reserach study scan. Confirmed as unchanged 12/07/2008 (4th month scan). Unchanged on followup CT 03/08/2009 but also present was subcentimeter mediastinal nodes (7th month scan). Had another followup CT chest 06/23/2009 (11th month scan). This showed enlargement of the LUL nodule to 1.3cm (in retrospect dec 2010 nodule measured at 1.2cm) but unchanged mediastoinal nodes. PET scan done 07/07/2009 shows NO UPTAKE in mediastinum or LUL nodule.   Comments:      independently reviewed  Impression & Recommendations:  Problem # 1:  PULMONARY NODULE (ICD-518.89) Assessment Unchanged  Orders: Radiology Referral (Radiology) Est. Patient Level III [id number]  STable LUL 1.2cm nodule 08/14/2008 -> 12/07/2008  -> 03/08/2009 -> 06/23/2009. NegativePET scan 07/07/2009  Overall very very low prob for malignancy  plan repeat ct in 6 months to complete 18th month CT She will call for results. Over phone if she continues to be asymptomatic and nodule is unchanged we will order the 24th months can without her coming in for office visit Radiology Referral (Radiology)  Medications Added to Medication List This Visit: 1)  Calcium-vitamin D 1200-400 Mg-unit Tab  .... Take 1 tablet by mouth two times a day  Patient Instructions: 1)  have another ct chest in 6-7 months  2)  This scan will be the 18th month  scan 3)  Call us to review report over phone after you have the scan

## 2010-04-21 NOTE — Miscellaneous (Signed)
Summary: CT without contrast in 6 mths and rov after  Clinical Lists Changes  Orders: Added new Referral order of Radiology Referral (Radiology) - Signed

## 2010-04-21 NOTE — Assessment & Plan Note (Signed)
Summary: osa per dr mr   Copy to:  Brand Males Primary Provider/Referring Provider:  Dr. Deland Pretty  CC:  Sleep consult per Dr. Chase Caller.  Epworth score is 19.Marland Kitchen  History of Present Illness: 70 yo female for sleep evaluation.  She had a sleep test 7 years ago, and was diagnosed with sleep apnea.  This was done in [place].  She has been on CPAP since then without change in her set up.  She has a nasal mask with a chin strap and humidifer.  She does not have any problem with her mask.  She has not received any new supplies for years.  She is using her CPAP about 4 out of 7 nights per week.  She goes to bed at 9pm, and falls asleep after about 30 minutes.  She wakes up a few times during the night to use the bathroom.  She wakes up at 8am, but lays in bed for about 2 more hours.  She sometimes feel sleepy in the morning, and is more groggy during the day if she does not use her CPAP the night before.  She still snores even when she uses her mask.  She does not use anything to help her sleep or stay awake.  She denies sleep walking, sleep talking, or nightmares.  She denies sleep hallucinations, sleep paralysis, or cataplexy.  She denies restless legs.  Preventive Screening-Counseling & Management  Alcohol-Tobacco     Smoking Status: never  Current Medications (verified): 1)  Caduet 10-80 Mg Tabs (Amlodipine-Atorvastatin) .... Take 1 Tablet By Mouth Once A Day 2)  Zetia 10 Mg Tabs (Ezetimibe) .... Take 1 Tablet By Mouth Once A Day 3)  Miralax  Powd (Polyethylene Glycol 3350) .... As Needed 4)  Lisinopril-Hydrochlorothiazide 20-12.5 Mg Tabs (Lisinopril-Hydrochlorothiazide) .... Take 1 Tablet By Mouth Once A Day 5)  Calcium-Vitamin D 250-125 Mg-Unit Tabs (Calcium Carbonate-Vitamin D) .... Take 1 Tablet By Mouth Two Times A Day 6)  Aspirin 81 Mg Tbec (Aspirin) .... Take 1 Tablet By Mouth Once A Day 7)  Glucotrol Xl 10 Mg Xr24h-Tab (Glipizide) .... Take 1 Tablet By Mouth Two Times  A Day 8)  Actoplus Met 15-850 Mg Tabs (Pioglitazone Hcl-Metformin Hcl) .... Take 1 Tablet By Mouth Two Times A Day 9)  Omeprazole 20 Mg Cpdr (Omeprazole) .... Take 1 Tablet By Mouth Once A Day 10)  Osteo Bi-Flex Joint Shield  Tabs (Misc Natural Products) .... Take 1 Tablet By Mouth Two Times A Day 11)  Tylenol 325 Mg Tabs (Acetaminophen) .... Per Bottle As Needed 12)  Fish Oil 1000 Mg Caps (Omega-3 Fatty Acids) .... Take 1 Tablet By Mouth Once A Day  Allergies (verified): 1)  ! 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 Mastectomy-left Tubal Ligation  Family History: Reviewed history from 11/12/2008 and no changes required. Mother-MI at age 67 Father-deceased at 33, unknown causes 27 bothers,three sisters 1 Sister-Diabetes 1Brother-diabetes 2 Brother- diabetes 3 Brother-diabetes, HTN, heart disease, stroke  Social History: Reviewed history from 11/12/2008 and no changes required. 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  Walks occ. for exercise PASSIVE SMOKING - brith through marriage -> her mom smoked.  64 years of marriage - husband smoked heavily  Vital Signs:  Patient profile:   70 year old female Height:      62 inches (157.48 cm) Weight:      195.13 pounds (88.70 kg) BMI:     35.82 O2 Sat:      97 % on Room air Temp:     97.6 degrees F (36.44 degrees C) oral Pulse rate:   88 / minute BP sitting:   130 / 60  (right arm) Cuff size:   regular  Vitals Entered By: Francesca Jewett CMA (January 05, 2009 10:36 AM)  O2 Flow:  Room air CC: Sleep consult  per Dr. Chase Caller.  Epworth score is 19.   Physical Exam  General:  obese.   Eyes:  PERRLA/EOM intact; conjunctiva and sclera clear Ears:  TMs intact and clear with normal canals Nose:  no deformity, discharge, inflammation, or lesions Mouth:  MP 3, no oral lesions Neck:  no JVD.   Lungs:  clear bilaterally to auscultation and percussion Heart:  regular rate and rhythm, S1, S2 without murmurs, rubs, gallops, or clicks Abdomen:  bowel sounds positive; abdomen soft and non-tender without masses, or organomegaly Pulses:  pulses normal Extremities:  no clubbing, cyanosis, edema, or deformity noted Neurologic:  CN II-XII grossly intact with normal reflexes, coordination, muscle strength and tone Cervical Nodes:  no significant adenopathy Psych:  alert and cooperative; normal mood and affect; normal attention span and concentration   Impression & Recommendations:  Problem # 1:  OBSTRUCTIVE SLEEP APNEA (ICD-327.23) She has a prior diagnosis of sleep apnea and has been on the same settings as her initial set up.  I will try to locate her DME company, which she had listed as [employer], and arrange for an auto-CPAP titration as she likely needs an adjustment in her pressure setting.  She will also need to be supplied with new equipment.  In addition I have explained the diagnosis of sleep apnea, and how this can affect her health and function.  This is of particular importance given that she is on multiple anti-hypertensive medications.  Driving precautions and the benefits of weight loss were reviewed.  Alternative treatment options for her sleep apnea were discussed.  I have asked her to try to locate a copy of her original sleep test for our records.  I have also discussed proper sleep hygiene, and emphasized the importance of using her CPAP machine for the entire time she is asleep.  Complete Medication List: 1)  Caduet 10-80 Mg Tabs (Amlodipine-atorvastatin) .... Take 1 tablet by  mouth once a day 2)  Zetia 10 Mg Tabs (Ezetimibe) .... Take 1 tablet by mouth once a day 3)  Lisinopril-hydrochlorothiazide 20-12.5 Mg Tabs (Lisinopril-hydrochlorothiazide) .... Take 1 tablet by mouth once a day 4)  Aspirin 81 Mg Tbec (Aspirin) .... Take 1 tablet by mouth once a day 5)  Glucotrol Xl 10 Mg Xr24h-tab (Glipizide) .... Take 1 tablet by mouth two times a day 6)  Actoplus Met 15-850 Mg Tabs (Pioglitazone hcl-metformin hcl) .... Take 1 tablet by mouth two times a day 7)  Omeprazole 20 Mg Cpdr (Omeprazole) .... Take 1 tablet by mouth once a day 8)  Osteo Bi-flex Joint Shield Tabs (Misc natural products) .... Take 1 tablet by mouth two times a day 9)  Calcium-vitamin D 250-125 Mg-unit Tabs (Calcium carbonate-vitamin d) .... Take 1 tablet by mouth two times a day 10)  Tylenol 325 Mg Tabs (Acetaminophen) .... Per bottle  as needed 11)  Fish Oil 1000 Mg Caps (Omega-3 fatty acids) .... Take 1 tablet by mouth once a day 12)  Miralax Powd (Polyethylene glycol 3350) .... As needed  Other Orders: Est. Patient Level IV [id number]) DME Referral (DME)  Patient Instructions: 1)  Will get report from CPAP machine  2)  Follow up in 2 to 3 months

## 2010-04-21 NOTE — Assessment & Plan Note (Signed)
Summary: LUNG NODULE ///kp   Visit Type:  Initial Consult Copy to:  Dr. Deland Pretty Primary Provider/Referring Provider:  Dr. Deland Pretty  CC:  Pulmonary Consult for abnormal CT scan. Marland Kitchen  History of Present Illness: IOV 11/12/2008: 70 year old ex-passive smoker, prior breast cancer and diabetic. Is at baseline health. Is part of a [facility] reserach study on diabetes. On 08/14/2008 underwent CT chest. Only report availabe for review. Reported to show LUL 8mm nodule, thryroid nodule, 59mm left kidney lesion. THerefore, has been referred here. No acute symptoms.  Preventive Screening-Counseling & Management  Alcohol-Tobacco     Smoking Status: never  Current Medications (verified): 1)  Caduet 10-80 Mg Tabs (Amlodipine-Atorvastatin) .... Take 1 Tablet By Mouth Once A Day 2)  Zetia 10 Mg Tabs (Ezetimibe) .... Take 1 Tablet By Mouth Once A Day 3)  Miralax  Powd (Polyethylene Glycol 3350) .... As Needed 4)  Lisinopril-Hydrochlorothiazide 20-12.5 Mg Tabs (Lisinopril-Hydrochlorothiazide) .... Take 1 Tablet By Mouth Once A Day 5)  Calcium-Vitamin D 250-125 Mg-Unit Tabs (Calcium Carbonate-Vitamin D) .... Take 1 Tablet By Mouth Two Times A Day 6)  Aspirin 81 Mg Tbec (Aspirin) .... Take 1 Tablet By Mouth Once A Day 7)  Glucotrol Xl 10 Mg Xr24h-Tab (Glipizide) .... Take 1 Tablet By Mouth Two Times A Day 8)  Actoplus Met 15-850 Mg Tabs (Pioglitazone Hcl-Metformin Hcl) .... Take 1 Tablet By Mouth Two Times A Day 9)  Protonix 40 Mg Tbec (Pantoprazole Sodium) .... Take 1 Tablet By Mouth Once A Day 10)  Osteo Bi-Flex Joint Shield  Tabs (Misc Natural Products) .... Take 1 Tablet By Mouth Two Times A Day 11)  Tylenol 325 Mg Tabs (Acetaminophen) .... Per Bottle As Needed 12)  Fish Oil 1000 Mg Caps (Omega-3 Fatty Acids) .... Take 1 Tablet By Mouth Once A Day  Allergies (verified): 1)  ! 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  Walks occ. for exercise PASSIVE SMOKING - brith through marriage -> her mom smoked. 6 years of marriage - husband smoked heavily  Risk Factors: Smoking Status: never (11/12/2008)  Past Medical History: Diabetes, Type 2 Hyperlipidemia Hypertension Sleep Apnea Multinodular Goiter Breast cancer s/p left mastectomy Chronic Anemia Atherosclerosis of aorta Obesity G E R D Right Bundle Branch Block Osteoarthritis Hx of superior end plate compression fracture at L5 with normal SPEP-2009 Labs drawn 08/18/08 from Dr. 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  Walks occ. for exercise PASSIVE SMOKING - brith through marriage -> her mom smoked. 70 years of marriage - husband smoked heavilySmoking Status:  never  Review of Systems       The patient complains of acid heartburn and joint stiffness or pain.  The patient denies shortness of breath with activity, shortness of breath at rest, productive cough, non-productive cough, coughing up blood, chest pain, irregular heartbeats, indigestion, loss of appetite, weight change, abdominal pain, difficulty swallowing, sore throat, tooth/dental problems, headaches, nasal congestion/difficulty breathing through nose, sneezing, itching, ear ache, anxiety, depression, hand/feet swelling, rash, change in color of mucus, and fever.    Vital Signs:  Patient profile:  70 year old female Height:      62 inches Weight:      190.25 pounds BMI:     34.92 O2 Sat:      100 % on Room air Temp:     98.2 degrees F oral Pulse  rate:   64 / minute BP sitting:   118 / 74  (right arm) Cuff size:   regular  Vitals Entered By: [place] Bing CMA (November 12, 2008 11:25 AM)  O2 Flow:  Room air CC: Pulmonary Consult for abnormal CT scan.  Comments Medications reviewed with patient [place] Bing CMA  November 12, 2008 11:27 AM    Physical Exam  General:  well developed, well nourished, in no acute distressobese.   Head:  normocephalic and atraumatic Eyes:  PERRLA/EOM intact; conjunctiva and sclera clear Ears:  TMs intact and clear with normal canals Nose:  no deformity, discharge, inflammation, or lesions Mouth:  no deformity or lesions Neck:  no masses, thyromegaly, or abnormal cervical nodes Chest Wall:  no deformities noted Lungs:  clear bilaterally to auscultation and percussion Heart:  regular rate and rhythm, S1, S2 without murmurs, rubs, gallops, or clicks Abdomen:  bowel sounds positive; abdomen soft and non-tender without masses, or organomegaly Msk:  no deformity or scoliosis noted with normal posture Pulses:  pulses normal Extremities:  no clubbing, cyanosis, edema, or deformity noted Neurologic:  CN II-XII grossly intact with normal reflexes, coordination, muscle strength and tone Skin:  intact without lesions or rashes Cervical Nodes:  no significant adenopathy Axillary Nodes:  no significant adenopathy Psych:  alert and cooperative; normal mood and affect; normal attention span and concentration   Impression & Recommendations:  Problem # 1:  PULMONARY NODULE (ICD-518.89) Assessment New 70 year old, passive smoker, reported to have LUL 14mm nodule in research Ct 08/14/2008  plan do ct chest now (will be the 3rd month ct) Orders: TLB-BMP (Basic Metabolic Panel-BMET) ([id number]) Radiology Referral (Radiology) Consultation Level IV [id number])  Problem # 2:  UNSPECIFIED DISORDER OF KIDNEY AND URETER (ICD-593.9) Assessment: New get ct abdomen pelvis for 69mm renal nodule seen on  08/14/2008 abd ct done at [facility] Orders: Radiology Referral (Radiology) Consultation Level IV [phone])  Patient Instructions: 1)  have ct scan chest, abdomen and pelvis 2)  return to see me after above  Appended Document: LUNG NODULE ///kp i GOT call on friday 8/27 I tink from [place] [facility]. They could not get IV. So, they are asking for PICC line. Per the tech who called me. She said that the radiologist said, it was imperative to give this patient a contrst for the scan. I do not know what the difficulty was. I would get her to CT at [facility] and hve the tech there try before getting PICC line. Please execute this  Appended Document: LUNG NODULE ///kp sent flag to [facility] to have Ct rescheduled at [facility].

## 2010-04-21 NOTE — Miscellaneous (Signed)
Summary: Orders Update   Clinical Lists Changes  Orders: Added new Referral order of Radiology Referral (Radiology) - Signed 

## 2010-04-21 NOTE — Letter (Signed)
Summary: [facility]   Imported By: Phillis Knack 06/24/2009 10:32:33  _____________________________________________________________________  External Attachment:    Type:   Image     Comment:   External Document

## 2010-04-21 NOTE — Assessment & Plan Note (Signed)
Summary: rov per checkout 11/12/08/mhh   Visit Type:  Follow-up Copy to:  Dr. Deland Pretty Primary Provider/Referring Provider:  Dr. Deland Pretty  CC:  Review CT results. .  History of Present Illness: IOV 11/12/2008: 70 year old ex-passive smoker, prior breast cancer and diabetic. Is at baseline health. Is part of a [facility] reserach study on diabetes. On 08/14/2008 underwent CT chest. Only report availabe for review. Reported to show LUL 17mm nodule, thryroid nodule, 75mm left kidney lesion. THerefore, has been referred here. No acute symptoms. REC: Do CT CHEST, ABD, PELVIS in [facility]  OV 12/10/2008. REturns for followup after scans. I personally reivwed the scanfrom 12/07/2008 (4 month scan) and confirm the official report. There is non calcified 78mm LUL nodule in the lung. The abdominal CT shows renal cysts. No other abnormalities. She continues to be asymptomatic.    Current Medications (verified): 1)  Caduet 10-80 Mg Tabs (Amlodipine-Atorvastatin) .... Take 1 Tablet By Mouth Once A Day 2)  Zetia 10 Mg Tabs (Ezetimibe) .... Take 1 Tablet By Mouth Once A Day 3)  Miralax  Powd (Polyethylene Glycol 3350) .... As Needed 4)  Lisinopril-Hydrochlorothiazide 20-12.5 Mg Tabs (Lisinopril-Hydrochlorothiazide) .... Take 1 Tablet By Mouth Once A Day 5)  Calcium-Vitamin D 250-125 Mg-Unit Tabs (Calcium Carbonate-Vitamin D) .... Take 1 Tablet By Mouth Two Times A Day 6)  Aspirin 81 Mg Tbec (Aspirin) .... Take 1 Tablet By Mouth Once A Day 7)  Glucotrol Xl 10 Mg Xr24h-Tab (Glipizide) .... Take 1 Tablet By Mouth Two Times A Day 8)  Actoplus Met 15-850 Mg Tabs (Pioglitazone Hcl-Metformin Hcl) .... Take 1 Tablet By Mouth Two Times A Day 9)  Omeprazole 20 Mg Cpdr (Omeprazole) .... Take 1 Tablet By Mouth Once A Day 10)  Osteo Bi-Flex Joint Shield  Tabs (Misc Natural Products) .... Take 1 Tablet By Mouth Two Times A Day 11)  Tylenol 325 Mg Tabs (Acetaminophen) .... Per Bottle As Needed 12)  Fish Oil 1000  Mg Caps (Omega-3 Fatty Acids) .... Take 1 Tablet By Mouth Once A Day  Allergies (verified): 1)  ! * Tramadol  Past History:  Past Medical History: Reviewed history from 11/12/2008 and no changes required. Diabetes, Type 2 Hyperlipidemia Hypertension Sleep Apnea Multinodular Goiter Breast cancer s/p left mastectomy Chronic Anemia Atherosclerosis of aorta Obesity G E R D Right Bundle Branch Block Osteoarthritis Hx of superior end plate compression fracture at L5 with normal SPEP-2009 Labs drawn 08/18/08 from Dr. Shelia Media: Creatinine 1.1, Albumin 3.9  Past Surgical History: Reviewed history from 11/12/2008 and no changes required. Mastectomy-left Tubal Ligation  Family History: Reviewed history from 11/12/2008 and no changes required. Mother-MI at age 57 Father-deceased at 36, unknown causes 23 bothers,three sisters 1 Sister-Diabetes 1Brother-diabetes 2 Brother- diabetes 3 Brother-diabetes, HTN, heart disease, stroke  Social History: Reviewed history from 11/12/2008 and no changes required. 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 Wine Patient never smoked.  Walks occ. for exercise PASSIVE SMOKING - brith through marriage -> her mom smoked. 21 years of marriage - husband smoked heavily  Review of Systems      See HPI  Vital Signs:  Patient profile:   70 year old female Height:      62 inches Weight:      193.13 pounds O2 Sat:      98 % on Room air Temp:     98.3 degrees F oral Pulse rate:   79 / minute BP sitting:   120 / 70  (right arm)  Cuff size:   regular  Vitals Entered By: Stewart Bing CMA (December 10, 2008 2:28 PM)  O2 Flow:  Room air CC: Review CT results.  Comments Medications reviewed with patient [place] Bing CMA  December 10, 2008 2:30 PM    Physical Exam  General:  well developed, well nourished, in no acute distressobese.   Head:  normocephalic and atraumatic Eyes:  PERRLA/EOM intact; conjunctiva and sclera  clear Ears:  TMs intact and clear with normal canals Nose:  no deformity, discharge, inflammation, or lesions Mouth:  no deformity or lesions Neck:  no masses, thyromegaly, or abnormal cervical nodes Chest Wall:  no deformities noted Lungs:  clear bilaterally to auscultation and percussion Heart:  regular rate and rhythm, S1, S2 without murmurs, rubs, gallops, or clicks Abdomen:  bowel sounds positive; abdomen soft and non-tender without masses, or organomegaly Msk:  no deformity or scoliosis noted with normal posture Pulses:  pulses normal Extremities:  no clubbing, cyanosis, edema, or deformity noted Neurologic:  CN II-XII grossly intact with normal reflexes, coordination, muscle strength and tone Skin:  intact without lesions or rashes Cervical Nodes:  no significant adenopathy Axillary Nodes:  no significant adenopathy Psych:  alert and cooperative; normal mood and affect; normal attention span and concentration   CT of Chest  Procedure date:  12/07/2008  Findings:      .  No clear evidence of thoracic metastasis. 2.  Solitary 8 mm pulmonary nodule.  Comparison with remote CT would be helpful.  If unavailable, recommend either a short interval noncontrast CT follow-up (3 months), percutaneous biopsy, or PET CT scan. 1.  No evidence of abdominal metastasis. 2.  Right simple renal cyst.  Likely left simple renal cyst.    Comments:      personally reviwed scan and agree  Impression & Recommendations:  Problem # 1:  PULMONARY NODULE (ICD-518.89) Assessment Unchanged STable LUL 22mm nodule 08/14/2008 -> 12/07/2008   plan repeat ct in 3 months Orders: Radiology Referral (Radiology) Est. Patient Level II [id number])  Medications Added to Medication List This Visit: 1)  Omeprazole 20 Mg Cpdr (Omeprazole) .... Take 1 tablet by mouth once a day  Other Orders: Sleep Disorder Referral (Sleep Disorder)  Patient Instructions: 1)  have repeat ct chest on or after 03/08/2009 2)   have flu shot asap 3)  return after ct chest 4)  see DR. Sood or [place] for sleep disorders breathing

## 2010-04-21 NOTE — Assessment & Plan Note (Signed)
Summary: rov ///kp   Visit Type:  Follow-up Copy to:  Brand Males Primary Provider/Referring Provider:  Dr. Deland Pretty  CC:  6 mo OSA follow-up.  The patient says she has not used her cpap for 1 week because it is not working properly.  She sleeps well when the machine works right..  History of Present Illness: 70 yo female with OSA on CPAP 15 cm H2O.  Her machine stopped working about one week ago.  She doesn't feel like it is giving pressure, and the humidifer isn't working.  As a result she has not been using her machine, and her sleep is worse.  Prior to this she felt that CPAP helped.  She did not have any trouble with her mask.  Current Medications (verified): 1)  Caduet 10-80 Mg Tabs (Amlodipine-Atorvastatin) .... Take 1 Tablet By Mouth Once A Day 2)  Zetia 10 Mg Tabs (Ezetimibe) .... Take 1 Tablet By Mouth Once A Day 3)  Lisinopril-Hydrochlorothiazide 20-12.5 Mg Tabs (Lisinopril-Hydrochlorothiazide) .... Take 1 Tablet By Mouth Once A Day 4)  Aspirin 81 Mg Tbec (Aspirin) .... Take 1 Tablet By Mouth Once A Day 5)  Glucotrol Xl 10 Mg Xr24h-Tab (Glipizide) .... Take 1 Tablet By Mouth Two Times A Day 6)  Actoplus Met 15-850 Mg Tabs (Pioglitazone Hcl-Metformin Hcl) .... Take 1 Tablet By Mouth Two Times A Day 7)  Omeprazole 20 Mg Cpdr (Omeprazole) .... Take 1 Tablet By Mouth Once A Day 8)  Osteo Bi-Flex Joint Shield  Tabs (Misc Natural Products) .... Take 1 Tablet By Mouth Two Times A Day 9)  Calcium-Vitamin D 1200-400 Mg-Unit Tab .... Take 1 Tablet By Mouth Two Times A Day 10)  Tylenol 325 Mg Tabs (Acetaminophen) .... Per Bottle As Needed 11)  Fish Oil 1000 Mg Caps (Omega-3 Fatty Acids) .... Take 1 Tablet By Mouth Once A Day 12)  Miralax  Powd (Polyethylene Glycol 3350) .... As Needed  Allergies (verified): No Known Drug Allergies  Past History:  Past Medical History: Reviewed history from 07/21/2009 and no changes required. Diabetes, Type  2 Hyperlipidemia Hypertension Obstructive Sleep Apnea      - CPAP 15 cm H2O Multinodular Goiter with calcification with Left > Right on CT April 2011 with contralateral tracheal deviation Breast cancer s/p left mastectomy Chronic Anemia Atherosclerosis of aorta Obesity G E R D Right Bundle Branch Block Osteoarthritis Superior end plate compression fracture at L5 with normal SPEP-2009  Past Surgical History: Reviewed history from 11/12/2008 and no changes required. Mastectomy-left Tubal Ligation  Review of Systems  The patient denies shortness of breath at rest, productive cough, non-productive cough, chest pain, acid heartburn, abdominal pain, sore throat, headaches, nasal congestion/difficulty breathing through nose, and hand/feet swelling.    Vital Signs:  Patient profile:   70 year old female Height:      62 inches (157.48 cm) Weight:      198.38 pounds (90.17 kg) BMI:     36.42 O2 Sat:      97 % on Room air Temp:     98.1 degrees F (36.72 degrees C) oral Pulse rate:   70 / minute BP sitting:   120 / 72  (right arm) Cuff size:   large  Vitals Entered By: Francesca Jewett CMA (November 17, 2009 9:50 AM)  O2 Sat at Rest %:  97 O2 Flow:  Room air CC: 6 mo OSA follow-up.  The patient says she has not used her cpap for 1 week because it is not  working properly.  She sleeps well when the machine works right. Comments Medications reviewed with the patient. Daytime phone verified. Francesca Jewett [facility]  November 17, 2009 9:57 AM   Physical Exam  General:  obese.   Nose:  no deformity, discharge, inflammation, or lesions Mouth:  MP 3, no oral lesions Neck:  no JVD.   Lungs:  clear bilaterally to auscultation and percussion Heart:  regular rate and rhythm, S1, S2 without murmurs, rubs, gallops, or clicks Extremities:  no clubbing, cyanosis, edema, or deformity noted Cervical Nodes:  no significant adenopathy Psych:  alert and cooperative; normal mood and affect; normal attention  span and concentration   Impression & Recommendations:  Problem # 1:  OBSTRUCTIVE SLEEP APNEA (ICD-327.23)  Will have her DME make sure her machine is working appropriately.    Problem # 2:  OBESITY (ICD-278.00) Encouraged her to start a diet and exercise regimen.  Complete Medication List: 1)  Caduet 10-80 Mg Tabs (Amlodipine-atorvastatin) .... Take 1 tablet by mouth once a day 2)  Zetia 10 Mg Tabs (Ezetimibe) .... Take 1 tablet by mouth once a day 3)  Lisinopril-hydrochlorothiazide 20-12.5 Mg Tabs (Lisinopril-hydrochlorothiazide) .... Take 1 tablet by mouth once a day 4)  Aspirin 81 Mg Tbec (Aspirin) .... Take 1 tablet by mouth once a day 5)  Glucotrol Xl 10 Mg Xr24h-tab (Glipizide) .... Take 1 tablet by mouth two times a day 6)  Actoplus Met 15-850 Mg Tabs (Pioglitazone hcl-metformin hcl) .... Take 1 tablet by mouth two times a day 7)  Omeprazole 20 Mg Cpdr (Omeprazole) .... Take 1 tablet by mouth once a day 8)  Osteo Bi-flex Joint Shield Tabs (Misc natural products) .... Take 1 tablet by mouth two times a day 9)  Calcium-vitamin D 1200-400 Mg-unit Tab  .... Take 1 tablet by mouth two times a day 10)  Tylenol 325 Mg Tabs (Acetaminophen) .... Per bottle as needed 11)  Fish Oil 1000 Mg Caps (Omega-3 fatty acids) .... Take 1 tablet by mouth once a day 12)  Miralax Powd (Polyethylene glycol 3350) .... As needed  Other Orders: Est. Patient Level III [id number]) DME Referral (DME)  Patient Instructions: 1)  Follow up in 6 months

## 2010-04-21 NOTE — Miscellaneous (Signed)
Summary: auto CPAP report 01/20/09 to 02/15/09  Clinical Lists Changes Optimal pressure 11 cm H2O with average AHI 19.3.  Used on 25 of 27 days with average of 6hrs 11min.  No significant leak.  Discussed results with patient.  Will need to proceed with in lab CPAP titration.  Will cancel her ROV for later this month, and schedule her for follow up after review of her CPAP titration. Orders: Added new Referral order of Sleep Disorder Referral (Sleep Disorder) - Signed

## 2010-04-21 NOTE — Assessment & Plan Note (Signed)
Summary: 3 months w/cpap/apc   Copy to:  Brand Males Primary Provider/Referring Provider:  Dr. Deland Pretty  CC:  OSA.  CPAP follow-up. The patient says she is doing well on CPAP.Marland Kitchen  History of Present Illness: 70 yo female with OSA on CPAP 15 cm H2O.  She has been doing well on her CPAP set up.  She has a full face mask, and denies any problems with the mask.   She goes to bed at 10pm, and falls asleep quickly.  She uses the bathroom once per night.  She gets up at 7 am, and feels good.  She feels well rested through out the day.  She denies sinus congestion, sore throat, or leg swelling.   Current Medications (verified): 1)  Caduet 10-80 Mg Tabs (Amlodipine-Atorvastatin) .... Take 1 Tablet By Mouth Once A Day 2)  Zetia 10 Mg Tabs (Ezetimibe) .... Take 1 Tablet By Mouth Once A Day 3)  Lisinopril-Hydrochlorothiazide 20-12.5 Mg Tabs (Lisinopril-Hydrochlorothiazide) .... Take 1 Tablet By Mouth Once A Day 4)  Aspirin 81 Mg Tbec (Aspirin) .... Take 1 Tablet By Mouth Once A Day 5)  Glucotrol Xl 10 Mg Xr24h-Tab (Glipizide) .... Take 1 Tablet By Mouth Two Times A Day 6)  Actoplus Met 15-850 Mg Tabs (Pioglitazone Hcl-Metformin Hcl) .... Take 1 Tablet By Mouth Two Times A Day 7)  Omeprazole 20 Mg Cpdr (Omeprazole) .... Take 1 Tablet By Mouth Once A Day 8)  Osteo Bi-Flex Joint Shield  Tabs (Misc Natural Products) .... Take 1 Tablet By Mouth Two Times A Day 9)  Calcium-Vitamin D 250-125 Mg-Unit Tabs (Calcium Carbonate-Vitamin D) .... Take 1 Tablet By Mouth Two Times A Day 10)  Tylenol 325 Mg Tabs (Acetaminophen) .... Per Bottle As Needed 11)  Fish Oil 1000 Mg Caps (Omega-3 Fatty Acids) .... Take 1 Tablet By Mouth Once A Day 12)  Miralax  Powd (Polyethylene Glycol 3350) .... As Needed  Allergies (verified): 1)  ! * Tramadol  Past History:  Past Medical History: Diabetes, Type 2 Hyperlipidemia Hypertension Obstructive Sleep Apnea      - CPAP 15 cm H2O Multinodular Goiter Breast  cancer s/p left mastectomy Chronic Anemia Atherosclerosis of aorta Obesity G E R D Right Bundle Branch Block Osteoarthritis Superior end plate compression fracture at L5 with normal SPEP-2009  Past Surgical History: Reviewed history from 11/12/2008 and no changes required. Mastectomy-left Tubal Ligation  Vital Signs:  Patient profile:   70 year old female Height:      62 inches (157.48 cm) Weight:      199 pounds (90.45 kg) BMI:     36.53 O2 Sat:      98 % on Room air Temp:     97.8 degrees F (36.56 degrees C) oral Pulse rate:   88 / minute BP sitting:   120 / 72  (right arm) Cuff size:   regular  Vitals Entered By: Francesca Jewett CMA (June 08, 2009 9:12 AM)  O2 Sat at Rest %:  98 O2 Flow:  Room air  Physical Exam  General:  obese.   Nose:  no deformity, discharge, inflammation, or lesions Mouth:  MP 3, no oral lesions Neck:  no JVD.   Lungs:  clear bilaterally to auscultation and percussion Heart:  regular rate and rhythm, S1, S2 without murmurs, rubs, gallops, or clicks Extremities:  no clubbing, cyanosis, edema, or deformity noted Cervical Nodes:  no significant adenopathy   Impression & Recommendations:  Problem # 1:  OBSTRUCTIVE SLEEP APNEA (ICD-327.23)  She has been doing well on her CPAP set up.  Advised her to continue working on losing weight.  Complete Medication List: 1)  Caduet 10-80 Mg Tabs (Amlodipine-atorvastatin) .... Take 1 tablet by mouth once a day 2)  Zetia 10 Mg Tabs (Ezetimibe) .... Take 1 tablet by mouth once a day 3)  Lisinopril-hydrochlorothiazide 20-12.5 Mg Tabs (Lisinopril-hydrochlorothiazide) .... Take 1 tablet by mouth once a day 4)  Aspirin 81 Mg Tbec (Aspirin) .... Take 1 tablet by mouth once a day 5)  Glucotrol Xl 10 Mg Xr24h-tab (Glipizide) .... Take 1 tablet by mouth two times a day 6)  Actoplus Met 15-850 Mg Tabs (Pioglitazone hcl-metformin hcl) .... Take 1 tablet by mouth two times a day 7)  Omeprazole 20 Mg Cpdr (Omeprazole)  .... Take 1 tablet by mouth once a day 8)  Osteo Bi-flex Joint Shield Tabs (Misc natural products) .... Take 1 tablet by mouth two times a day 9)  Calcium-vitamin D 250-125 Mg-unit Tabs (Calcium carbonate-vitamin d) .... Take 1 tablet by mouth two times a day 10)  Tylenol 325 Mg Tabs (Acetaminophen) .... Per bottle as needed 11)  Fish Oil 1000 Mg Caps (Omega-3 fatty acids) .... Take 1 tablet by mouth once a day 12)  Miralax Powd (Polyethylene glycol 3350) .... As needed  Other Orders: Est. Patient Level III [id number])  Patient Instructions: 1)  Follow up in 6 months  Appended Document: 3 months w/cpap/apc [name], this lady was supposed to see me for CT chest fu after xmas 2010 but she never did. Please find out. Thanks MR  Appended Document: 3 months w/cpap/apc appt is set for 06/23/09 for CT scan, is this ok?   Appended Document: 3 months w/cpap/apc fine

## 2010-04-21 NOTE — Letter (Signed)
Summary: [facility]   Imported By: Bubba Hales 11/16/2008 09:57:15  _____________________________________________________________________  External Attachment:    Type:   Image     Comment:   External Document

## 2010-04-21 NOTE — Miscellaneous (Signed)
Summary: CPAP titration study 02/25/09  Clinical Lists Changes CPAP 15 cm H2O with AHI reduction to 0.  Observed in REM and supine sleep.    Will set up CPAP 15 cm H2O and have my nurse call to arrange ROV in 3 months. Orders: Added new Referral order of DME Referral (DME) - Signed  Appended Document: CPAP titration study 02/25/09 Patient is aware of CPAP set up and to call if she has any problems or questions. A reminder will be put in IDX for her 3 mo f/u with VS.

## 2010-05-11 ENCOUNTER — Ambulatory Visit
Admission: RE | Admit: 2010-05-11 | Discharge: 2010-05-11 | Disposition: A | Discharge status: Home / Self Care | Payer: Medicare Other | Source: Ambulatory Visit | Attending: Obstetrics and Gynecology | Admitting: Obstetrics and Gynecology

## 2010-05-11 DIAGNOSIS — Z1239 Encounter for other screening for malignant neoplasm of breast: Secondary | ICD-10-CM

## 2010-05-24 ENCOUNTER — Encounter (HOSPITAL_BASED_OUTPATIENT_CLINIC_OR_DEPARTMENT_OTHER): Payer: Medicare Other | Admitting: Oncology

## 2010-05-24 ENCOUNTER — Other Ambulatory Visit (HOSPITAL_COMMUNITY): Payer: Self-pay | Admitting: Oncology

## 2010-05-24 DIAGNOSIS — D509 Iron deficiency anemia, unspecified: Secondary | ICD-10-CM

## 2010-05-24 LAB — CBC WITH DIFFERENTIAL/PLATELET
BASO%: 0.2 % (ref 0.0–2.0)
Basophils Absolute: 0 10*3/uL (ref 0.0–0.1)
EOS%: 1.3 % (ref 0.0–7.0)
Eosinophils Absolute: 0.1 10*3/uL (ref 0.0–0.5)
HCT: 34.7 % — ABNORMAL LOW (ref 34.8–46.6)
HGB: 11.6 g/dL (ref 11.6–15.9)
LYMPH%: 30.1 % (ref 14.0–49.7)
MCH: 29.9 pg (ref 25.1–34.0)
MCHC: 33.4 g/dL (ref 31.5–36.0)
MCV: 89.4 fL (ref 79.5–101.0)
MONO#: 0.3 10*3/uL (ref 0.1–0.9)
MONO%: 6.3 % (ref 0.0–14.0)
NEUT#: 3.3 10*3/uL (ref 1.5–6.5)
NEUT%: 62.1 % (ref 38.4–76.8)
Platelets: 254 10*3/uL (ref 145–400)
RBC: 3.88 10*6/uL (ref 3.70–5.45)
RDW: 13.9 % (ref 11.2–14.5)
WBC: 5.4 10*3/uL (ref 3.9–10.3)
lymph#: 1.6 10*3/uL (ref 0.9–3.3)
nRBC: 0 % (ref 0–0)

## 2010-05-28 LAB — IRON AND TIBC
%SAT: 27 % (ref 20–55)
Iron: 89 ug/dL (ref 42–145)
TIBC: 332 ug/dL (ref 250–470)
UIBC: 243 ug/dL

## 2010-05-28 LAB — COMPREHENSIVE METABOLIC PANEL
ALT: 14 U/L (ref 0–35)
AST: 16 U/L (ref 0–37)
Albumin: 4.4 g/dL (ref 3.5–5.2)
Alkaline Phosphatase: 74 U/L (ref 39–117)
BUN: 25 mg/dL — ABNORMAL HIGH (ref 6–23)
CO2: 23 mEq/L (ref 19–32)
Calcium: 10 mg/dL (ref 8.4–10.5)
Chloride: 102 mEq/L (ref 96–112)
Creatinine, Ser: 0.97 mg/dL (ref 0.40–1.20)
Glucose, Bld: 106 mg/dL — ABNORMAL HIGH (ref 70–99)
Potassium: 4.1 mEq/L (ref 3.5–5.3)
Sodium: 138 mEq/L (ref 135–145)
Total Bilirubin: 0.5 mg/dL (ref 0.3–1.2)
Total Protein: 6.9 g/dL (ref 6.0–8.3)

## 2010-05-28 LAB — LACTATE DEHYDROGENASE: LDH: 199 U/L (ref 94–250)

## 2010-05-28 LAB — FERRITIN: Ferritin: 56 ng/mL (ref 10–291)

## 2010-05-28 LAB — TRANSFERRIN RECEPTOR, SOLUABLE: Transferrin Receptor, Soluble: 1.65 mg/L (ref 0.76–1.76)

## 2010-06-07 LAB — GLUCOSE, CAPILLARY: Glucose-Capillary: 128 mg/dL — ABNORMAL HIGH (ref 70–99)

## 2010-07-05 ENCOUNTER — Other Ambulatory Visit: Payer: Self-pay | Admitting: Neurology

## 2010-07-05 DIAGNOSIS — R51 Headache: Secondary | ICD-10-CM

## 2010-07-11 ENCOUNTER — Ambulatory Visit
Admission: RE | Admit: 2010-07-11 | Discharge: 2010-07-11 | Disposition: A | Discharge status: Home / Self Care | Payer: Medicare Other | Source: Ambulatory Visit | Attending: Neurology | Admitting: Neurology

## 2010-07-11 DIAGNOSIS — R51 Headache: Secondary | ICD-10-CM

## 2010-07-11 MED ORDER — GADOBENATE DIMEGLUMINE 529 MG/ML IV SOLN
18.0000 mL | Freq: Once | INTRAVENOUS | Status: AC | PRN
  Administered 2010-07-11: 18 mL via INTRAVENOUS

## 2010-08-02 NOTE — Op Note (Signed)
NAME:  Karen West, Karen West NO.:  [id number]   MEDICAL RECORD NO.:  [id number]          PATIENT TYPE:  AMB   LOCATION:  ENDO                         FACILITY:  [facility]   PHYSICIAN:  Waverly Ferrari, M.D.    DATE OF BIRTH:  Aug 11, 1940   DATE OF PROCEDURE:  07/09/2007  DATE OF DISCHARGE:                               OPERATIVE REPORT   PROCEDURE:  Upper endoscopy.   INDICATIONS:  Abdominal pain.   ANESTHESIA:  Fentanyl 50 mcg, Versed 6 mg.   PROCEDURE:  With the patient mildly sedated in the left lateral  decubitus position, the Pentax videoscopic endoscope was inserted into  the mouth and passed under direct vision through the esophagus; which  appeared normal.  Into and through a hiatal hernia,  into the stomach.  The fundus, body, antrum, duodenal bulb and second portion of the  duodenum all appeared normal.  From this point the endoscope was slowly  withdrawn, taking circumferential views of the duodenal mucosa; until  the endoscope been pulled back into the stomach and placed in  retroflexion to view the stomach from below.  The endoscope was  straightened and withdrawn, taking circumferential views of remaining  gastric and esophageal mucosa.  The patient's vital signs and pulse  oximetry remained stable.  The patient tolerated the procedure well  without apparent complication.   FINDINGS:  Hiatal hernia, otherwise an unremarkable exam.   PLAN:  Have the patient follow up with me as an outpatient.           ______________________________  Waverly Ferrari, M.D.     GMO/MEDQ  D:  07/09/2007  T:  07/09/2007  Job:  [id number]

## 2010-08-02 NOTE — Op Note (Signed)
NAME:  Karen West, Karen West              ACCOUNT NO.:  [id number]   MEDICAL RECORD NO.:  [id number]          PATIENT TYPE:  AMB   LOCATION:  [place]                          FACILITY:  [facility]   PHYSICIAN:  Youlanda Mighty. Sypher, M.D. 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   POSTOPERATIVE DIAGNOSIS:  Chronic stenosing tenosynovitis, right thumb  at A1 pulley.   OPERATION:  Release of right thumb A1 pulley.   OPERATIONS:  Theodis Sato, MD   ASSISTANT:  Leverne Humbles PA-C.   ANESTHESIA:  2% lidocaine, metacarpal head level block without sedation.   INDICATIONS:  Karen West is a 69 year old woman referred for  evaluation and management of a chronic locking right thumb.  She has  tenderness at the A1 pulley.   Due to failure to respond to nonoperative measures, she is brought to  the operating at this time for release of the right thumb, A1 pulley.   PROCEDURE:  Karen West is brought to the operating room and placed  in supine position on the operating table.   Following Betadine prep, 2% lidocaine was infiltrated into the right  thumb in the region of the A1 pulley and flexor sheath.   After a few moments, excellent anesthesia was achieved.   The right arm was prepped with Betadine soap solution, sterilely draped.  Following exsanguination of the right arm with Esmarch bandage an  arterial tourniquet, proximal brachium was inflated to 240 mmHg.  The  procedure commenced with short transverse incision directly over the  palpably thickened pulley.  The subcutaneous tissue was carefully  divided, taking care to gently retract the radial and ulnar __________  nerves.  The A1 pulley was isolated,  split with scalpel and scissors.  Thereafter, full active range of  motion of the IP and MP joints of the thumb was recovered.   The wound  was then repaired with mattress sutures of 5-0 nylon.   A compressor dressing is applied with Xeroflo sterile gauze and an Ace  wrap.  There are no apparent complications.      Youlanda Mighty Sypher, M.D.  Electronically Signed     RVS/MEDQ  D:  07/23/2007  T:  07/24/2007  Job:  [id number]

## 2010-08-02 NOTE — Op Note (Signed)
NAME:  Karen West, Karen West              ACCOUNT NO.:  [id number]   MEDICAL RECORD NO.:  [id number]          PATIENT TYPE:  AMB   LOCATION:  [place]                          FACILITY:  [facility]   PHYSICIAN:  Youlanda Mighty. Sypher, M.D. 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   OPERATION:  Release of right thumb A1 pulley.   OPERATING SURGEON:  Youlanda Mighty. Sypher, MD   ASSISTANT:  Marily Lente. Dasnoit, PA-C   ANESTHESIA:  Lidocaine 2% metacarpal head level block and flexor sheath  block, right thumb without supplemental sedation.   ANESTHETIST:  Youlanda Mighty. Sypher, MD   INDICATIONS:  [name] is a 70 year old woman referred for  evaluation and management of a locking right trigger thumb.  Clinical  examination revealed stenosing tenosynovitis at the A1 pulley.  She had  full range of motion of her fingers.  There is no sign of entrapped  neuropathy.   We advised release of the A1 pulley under local anesthesia.  Preoperatively, she was advised to the spectrum of choices including  general anesthesia, regional anesthesia, or local.  She selected local  without sedation.   Questions were invited and answered in detail.   PROCEDURE:  Kelleen Verser is brought to the operating room and placed  in supine position upon the operating table.   Following the placement of a 2% lidocaine metacarpal head level block  and flexor sheath block, the right arm was prepped with Betadine soap  solution and sterilely draped.  A pneumatic tourniquet was applied to  the proximal brachium.   Following routine Betadine scrub and paint, the right arm was  exsanguinated with direct compression; and the arterial tourniquet  inflated on the proximal brachium to 220 mmHg.   The procedure commenced  with a short transverse incision directly over the palpably thickened A1  pulley.  Subcutaneous tissues were carefully divided, taking care to  identify the radial and ulnar proper digital nerves.  These were gently  retracted.  A Freer elevator was used to clear the flexor sheath  overlying the A1 pulley.  The pulley was isolated and subsequently split  with scalpel and scissors.  The flexor tendon was identified and found  to be otherwise normal.   Thereafter, full active range of motion of the IP joint was demonstrated  and full motion of the MP joint was noted without signs of residual  triggering.   The wound was then repaired with mattress sutures of 5-0 nylon.   Compressive dressing was applied with Xeroflo sterile gauze and Ace  wrap.   There were no apparent complications.   Ms. Jasmin was advised to use Darvocet-N 100 one p.o. q.6 h. p.r.n.  pain.  She is provided a prescription for 20 tablets.  She will return  to see [place] in the operative followup in 1 week.   Questions regarding __________  were invited and answered.      Youlanda Mighty Sypher, M.D.  Electronically Signed     RVS/MEDQ  D:  07/23/2007  T:  07/24/2007  Job:  [id number]

## 2010-08-02 NOTE — H&P (Signed)
NAME:  Karen West, Karen West NO.:  [id number]   MEDICAL RECORD NO.:  [id number]          PATIENT TYPE:  INP   LOCATION:  6704                         FACILITY:  [facility]   PHYSICIAN:  Marlene Bast, MDDATE OF BIRTH:  12-Sep-1940   DATE OF ADMISSION:  02/07/2008  DATE OF DISCHARGE:                              HISTORY & PHYSICAL   CHIEF COMPLAINT:  Vomiting black tarry material.   HISTORY OF PRESENT ILLNESS:  Karen West is a 70 year old woman who  said she started feeling sick last night.  She was actually seen in the  emergency department yesterday for severe back pain.  She received a  dose of IV Toradol and was sent home.  She said later that night, she  started feeling nauseated and started feeling sick and started vomiting.  She vomited what she describes as a jet-black material this morning and  during the day, she vomited several times more, she thinks a total of 5  times since this started last night.  She came into the emergency  department to get some relief.  She has never done this before.  She has  not had any chest pain or shortness of breath.   PAST MEDICAL HISTORY:  Significant for diabetes mellitus type 2,  hypertension, hyperlipidemia, hypothyroidism, history of a severe spinal  stenosis, which is causing her back pain.  She has been taking various  over-the-counter medications for this back pain, but yesterday the pain  got so bad she had to go to the emergency department.  She is seeing Dr.  Ernestina Patches of Orthopedics for the pain, and she actually has a followup  appointment in about 2 weeks.  She says the pain he prescribes in  physical therapy and the pain did get better for a while but over the  last couple of weeks, she has come back.  She has been taking Excedrin,  some Tylenol, some Naprosyn, just over-the-counter agents.  She also has  some tramadol that she is taking, though she has not used that recently.  The patient does have a history of  breast cancer x3.  She is status post  chemo.  Initially, she is status post radiation and status post  mastectomy.  She has been cancer free since 2000.  She also has goiter,  which has been followed.  She has a history of an EGD in April 2009, by  Dr. Lajoyce Corners that was negative except for hiatal hernia.   MEDICATIONS ON ARRIVAL:  1. ACTOplus Met 15/850 twice daily.  2. Caduet 10/80 once daily.  3. Glipizide 10 mg daily.  4. Lisinopril.  5. Hydrochlorothiazide 12.5/25.  6. Zetia 10 mg daily.  7. Synthroid 50 mcg one-half tablet daily.  8. Calcium twice daily.  9. Vitamin C daily.  10.Omega 3 fish oil daily.  11.Vitamin 3 and potassium combination tablet daily.   SOCIAL HISTORY:  She does not smoke cigarettes, drink alcohol, or use  any illicit drugs.   FAMILY HISTORY:  Noncontributory.   REVIEW OF SYSTEMS:  CONSTITUTIONAL:  No fevers.  No night sweats.  Appetite has not  been good over the last couple of days because of the  severe pain prior to that her appetite was fine.  Weight stable.  HEENT:  No headaches.  No double vision.  No sore throat.  CARDIOVASCULAR:  No  chest pain or lower extremity edema.  RESPIRATORY:  No shortness of  breath.  No hemoptysis.  GI:  No diarrhea.  As a matter of fact, she is  a little constipated.  She has vomited up this black stuff as mentioned  above.  GU: Negative.  All other systems reviewed and are negative  except for musculoskeletal with a severe lower back pain, sometimes  radiates into her buttocks area.  It hurts all the time and is worse  when she moves, though she said it was a little better this morning  after the injection, she got in the emergency department last night.   Vitals in the emergency department are temperature was 97.4, blood  pressure 140/72, pulse 92, respiratory rate 22, and O2 sats 100% on room  air.   PHYSICAL EXAMINATION:  GENERAL:  The patient is well-nourished, well-  developed in no acute distress.  HEENT:   Normocephalic and atraumatic.  Pupils are equal and round.  Sclera nonicteric.  Oral mucosa moist.  NECK:  Supple.  No lymphadenopathy.  No thyromegaly.  No jugular venous  distention.  CARDIAC:  Regular rate and rhythm with a well slightly tachycardic  actually with a systolic murmur.  LUNGS:  Clear to auscultation bilaterally.  No wheezes, no rhonchi, no  rales.  Her chest wall reveals the mastectomy.  ABDOMEN:  Soft.  She has mild diffuse tenderness.  No rebound or  guarding.  No hepatosplenomegaly.  Abdomen is slightly obese I am unable  to appreciate any masses.  EXTREMITIES:  No evidence of clubbing, cyanosis, or edema.  NEUROLOGIC:  The patient is alert and oriented x3.  Her cranial nerves  II-XII are intact grossly.  She moves each of her extremities to  command.  No clear focal motor asymmetry, though her neurologic  examination is shortened because of the patient's significant back pain.  She already has a diagnosis of back pain, and she was in a great deal of  pain just from rolling over on her side and lifting her legs in the air,  so further examination is deferred.  Her skin is intact.  No open  lesions.  No rashes.  MUSCULOSKELETAL:  No evidence of effusion of her joints.  Good range of  motion.   LABORATORY DATA:  Her white count is 6.7, hemoglobin 10.0, hematocrit  30.5, platelet count is 347.  Urinalysis reveals cloudy urine with a  large leukocyte esterase, 7-10 wbc's, few bacteria, cloudy urine.  Her  Gastroccult is positive.  Hemoccult from below is negative.  Troponin  less than 0.05, INR 0.9, and prothrombin time 12.6.  Sodium is 135,  potassium 3.6, chloride 99, bicarb 28, glucose 116, BUN 32, creatinine  0.87, AST 15, ALT 15, albumin 4.1.  She did have a chest x-ray, which  reveals a large amount of stool, left mastectomy, nonobstructive bowel  gas pattern, and low volume chest.   ASSESSMENT AND PLAN:  1. Upper GI bleed.  Her hemoglobin is 10.  I do not have  her previous      hemoglobin, but the patient is certainly hemodynamically stable.      We will admit her to a medical bed.  She will be n.p.o.  We will  put her on IV fluids.  We will consult GI and will put her on      b.i.d. IV Protonix.  I counseled the patient about the need to stop      oral NSAIDs.  2. Severe back pain from spinal stenosis.  Her treatment for this will      certainly be more difficult now that she is not able to take any      NSAIDs or use narcotics while she is in the hospital.  The patient      is not happy about that      but is agreeable.  She does have Orthopedic followup already      scheduled on February 20, 2008.  We will hold all of her      nonessential medications.  Actually, we will hold all of her high      blood pressure, diabetes, and essentially all her medications      except for Synthroid for now until we are sure there is no active      bleeding.      Marlene Bast, MD  Electronically Signed     CVC/MEDQ  D:  02/07/2008  T:  02/08/2008  Job:  [id number]   cc:   Waverly Ferrari, M.D.

## 2010-08-02 NOTE — Discharge Summary (Signed)
NAME:  Karen West, POTENZA NO.:  [id number]   MEDICAL RECORD NO.:  [id number]          PATIENT TYPE:  INP   LOCATION:  6704                         FACILITY:  [facility]   PHYSICIAN:  Sharlet Salina, M.D.   DATE OF BIRTH:  May 17, 1940   DATE OF ADMISSION:  02/07/2008  DATE OF DISCHARGE:  02/11/2008                               DISCHARGE SUMMARY   DISCHARGE DIAGNOSES:  1. Gastrointestinal bleed secondary to nonsteroidal anti-inflammatory      drugs use.  2. Severe back pain secondary to spinal stenosis.  3. Diabetes.  4. Hypertension.  5. Hyperlipidemia.  6. Hypothyroidism.  7. History of breast cancer,  status post mastectomy with radiation      and chemotherapy.  8. Spinal stenosis/chronic back pain followed by primary care doctor,      Dr. Shelia Media  and also by Dr. Ernestina Patches.  Please  The patient to follow      up with Dr. Ernestina Patches, keep her appointment for December 3 and follow      up with Dr. Shelia Media in one week.  9. Urinary tract infection.   DISCHARGE MEDICATIONS:  1. Actos Plus metformin 15/850 twice daily.  2. Caduet 10/80 daily.  3. Glipizide 10 mg daily.  4. Lisinopril daily.  5. HCTZ 12.5 mg daily.  6. Zetia 10 mg daily.  7. Synthroid 50 mcg half tablet daily.  8. Calcium twice daily.  9. Vitamin C daily.  10.Omega III fish oil daily.  11.Vitamin with potassium combination daily.  12.Percocet 5/325 mg q.4 h. P.r.n.  13.Flexeril 5 mg t.i.d. p.r.n.  14.Protonix 40 mg p.o. daily.   PROCEDURE:  The patient had a upper endoscopy.   CONSULTANTS:  Waverly Ferrari, M.D., GI doctor.   HISTORY OF PRESENT ILLNESS:  The patient is a 70 year old female who  started feeling sick at night prior to admission.  She went to the  emergency room secondary to severe back pain.  She was given IV Toradol  and sent home.  She later started having some vomiting jet black  material that morning, was told to come to the emergency room.   Past medical history, family history,  social history, meds, allergies,  review of systems per admission H&P.   PHYSICAL EXAMINATION ON DISCHARGE:  VITAL SIGNS:  Temperature 98.1,  pulse 80, respirations 20, blood pressure 111/70, pulse ox 100% on room  air.  GENERAL:  Patient sitting up in chair.  Does not seem to be in any acute  distress.  HEENT: Head is normocephalic, atraumatic.  Pupils reactive to light.  Throat without erythema.  CARDIOVASCULAR:  Regular rate and rhythm.  LUNGS:  Clear bilaterally.  ABDOMEN:  Positive bowel sounds.  No edema.   HOSPITAL COURSE:  1. Gastrointestinal bleed:  The patient was admitted to the hospital.      GI was consulted.  She had upper endoscopy done.  She had a large      hiatal hernia that was seen on the upper endoscopy, no evidence of      ulceration, inflammation or erosion.  Recommendation  is to follow  up.  Continue PPI.  Discussed this with the patient.  She will      follow up with her primary care physician.  Discharge her on      Protonix 40 mg daily.  2. Diabetes:  The patient will continue her outpatient regimen for her      diabetes.  3. Hypertension:  The patient will also continue her outpatient      medication for her hypertension.  4. Chronic back pain:  The patient states that she has been seeing Dr.      Shelia Media and Dr. Ernestina Patches for her back pain.  She has been having back      pain for over a year.  She stated that she had x-rays done per Dr.      Shelia Media and also per Dr. Ernestina Patches  She is also scheduled for another      MRI on Wednesday.  She stated that while she was in was physical      therapy, she was doing well but after physical therapy, it flared      back up again.  Will discharge her on Flexeril, Percocet,      encouraged to not take any NSAIDs and to follow up with Dr. Ernestina Patches.      She was discharged with home health PT, 3-in-1 commode and a      walker.  5. UTI:  The patient was treated with outpatient Cipro and also      continued treatment while  she was in the hospital for a total of 4      days.  A KUB on admission showed large right side fecal __________,      nonobstructive bowel gas pattern.   DISCHARGE LABORATORY DATA:  Sodium 141, potassium 3.9, chloride 106, CO2  27, glucose 126, BUN 9, creatinine 1.83.  WBC 7.3, hemoglobin 9.9,  platelets 384,000.  Urine culture showed Proteus.  The patient was  treated with Cipro.  also at the discharge diagnosis      Sharlet Salina, M.D.  Electronically Signed     NJ/MEDQ  D:  02/11/2008  T:  02/11/2008  Job:  [id number]   cc:   Leonides Sake. Lucia Gaskins, M.D.  Lynnell Chad. Shelia Media, M.D.

## 2010-08-02 NOTE — Consult Note (Signed)
NAME:  Karen West, Karen West NO.:  [id number]   MEDICAL RECORD NO.:  [id number]          PATIENT TYPE:  INP   LOCATION:  6704                         FACILITY:  [facility]   PHYSICIAN:  Tory Emerald. Benson Norway, MD    DATE OF BIRTH:  [birth year]   DATE OF CONSULTATION:  02/08/2008  DATE OF DISCHARGE:                                 CONSULTATION   This is a consultation for Karen West, M.D.   HISTORY OF PRESENT ILLNESS:  This is a 70 year old female with a past  medical history of diabetes, hypertension, hyperlipidemia,  hypothyroidism, history of breast cancer, status post mastectomy with  radiation and chemotherapy who was known to be cancer patient in 2000  and spinal stenosis with chronic back pain who was admitted to the  hospital with complaints of hematemesis, episodes of coffee-ground  emesis.  The patient apparently had 5 episodes of vomiting of black  material.  She states that she has been having difficulty with chronic  back pain and spinal stenosis and she was using significant amount of  over-the-counter medications in particular Naprosyn, Tylenol, and  Excedrin.  She denies having any abdominal pain, but because of the  persistent back pain, she presented to the emergency room for further  evaluation and treatment.  At that time, she received a shot of Toradol.  Subsequently, after her discharge from the emergency room, she started  having the vomiting as a result.  No complaints of any hematochezia or  melena.  In fact, she denies having any blood bowel movements.   PAST MEDICAL HISTORY/PAST SURGICAL HISTORY:  As stated above.   FAMILY HISTORY:  Noncontributory.   SOCIAL HISTORY:  Negative for alcohol, tobacco, or illicit drug use.   ALLERGIES:  No known drug allergies.   CURRENT MEDICATIONS:  1. Bisacodyl 10 mg p.r. daily.  2. Ciprofloxacin 400 mg IV b.i.d.  3. Levothyroxine 25 mcg p.o. daily.  4. Protonix 40 mg IV b.i.d.  5. Ventolin 2.5 mg inhaler q.2 h.  p.r.n.  6. Dilaudid 0.5 mg to 1 mg IV q.4 h. p.r.n.  7. Zofran 4 mg IV q.6 h.   PHYSICAL EXAMINATION:  VITAL SIGNS:  Blood pressure is 120/77, heart  rate is 76, respirations 18, temperature is 98.0.  GENERAL:  The patient is in no acute distress.  Alert and oriented.  HEENT:  Normocephalic and atraumatic.  Extraocular muscles are intact.  NECK:  Supple.  No lymphadenopathy.  LUNGS:  Clear to auscultation bilaterally.  CARDIOVASCULAR:  Regular rate and rhythm.  ABDOMEN:  Obese, soft, nontender, and nondistended.  EXTREMITIES:  No clubbing, cyanosis, or edema.   LABORATORY VALUES:  White blood cell count is 5.5, hemoglobin 9.3,  platelets at 328, sodium 140, potassium 3.4, chloride 102, CO2 29,  glucose 114, BUN is 72, and creatinine is 0.7.   IMPRESSION:  1. Coffee-ground emesis.  2. Spinal stenosis with NSAID use.  3. Other medical problems.  At this time, the patient is      hemodynamically stable.  Her hemoglobin has a mild decrease from      10.0  to 9.3.  No complaints of any hematochezia.  She may have some      ulcerations or erosions secondary to the NSAID use.   PLAN:  At this time is to perform an EGD tomorrow and further evaluation  pending the findings.      Tory Emerald Benson Norway, MD  Electronically Signed     PDH/MEDQ  D:  02/08/2008  T:  02/08/2008  Job:  [id number]   cc:   Karen West, M.D.

## 2010-08-05 NOTE — Procedures (Signed)
[facility]  Patient:    Karen West, Karen West                     MRN: [id number] Proc. Date: 09/12/99 Adm. Date:  [id number] Attending:  Jim Desanctis                           Procedure Report  PROCEDURE:  Colonoscopy.  ENDOSCOPIST:  Jim Desanctis, M.D.  INDICATIONS: Colon polyp. Hemoccult positive stools.  ANESTHESIA: Demerol 30 mg, Versed 3 mg, given intravenously in divided doses.  DESCRIPTION OF PROCEDURE:  With the patient mildly sedated in the left lateral decubitus position, the Olympus videoscopic colonoscope was inserted in the rectum and passed with pressure applied to the cecum. The cecum was identified by lipomatous ileocecal valve photographed, as was the appendiceal orifice. From this point, the colonoscope was then slowly withdrawn taking circumferential views of the entire colonic mucosa stopping in the rectum which appeared normal on direct and retroflex view. The tone of the rectum was poor. The patients vital signs and pulse oximetry remained stable. The patient tolerated the procedure well with no apparent complications.  FINDINGS:  Essentially negative colonoscopic examination to the cecum.  PLAN:  Repeat examination in five years. DD:  09/12/99 TD:  09/13/99 Job: [id number]

## 2010-08-05 NOTE — Op Note (Signed)
NAME:  Karen West, BOARDMAN NO.:  [id number]   MEDICAL RECORD NO.:  [id number]          PATIENT TYPE:  AMB   LOCATION:  ENDO                         FACILITY:  [facility]   PHYSICIAN:  Waverly Ferrari, M.D.    DATE OF BIRTH:  1940/06/04   DATE OF PROCEDURE:  12/02/2004  DATE OF DISCHARGE:                                 OPERATIVE REPORT   PROCEDURE:  Colonoscopy.   INDICATIONS:  Hemoccult positivity, colon cancer screening.   ANESTHESIA:  Demerol 80, Versed 9 mg.   PROCEDURE:  With patient mildly sedated in the left lateral decubitus  position, a rectal examination was done which had trace positive material.  Subsequently, the Olympus videoscopic colonoscope was inserted into the  rectum and passed under direct vision with pressure applied and patient  turned in numerous positions, we were able to subsequently reach the cecum,  as identified by ileocecal valve and base of cecum, both of which were  photographed.  From this point, the colonoscope was then slowly withdrawn  taking circumferential views of the colonic mucosa stopping in the rectum  which appeared normal on direct and retroflexed view.  The endoscope was  straightened and withdrawn.  The patient's vital signs and pulse oximeter  remained stable.  The patient tolerated the procedure well without apparent  complication.   FINDINGS:  Negative examination but with quite tortuous colon with need to  apply pressure and turned the patient in number of positions to reach the  cecum.   PLAN:  Have patient follow up with me on an as-needed basis.           ______________________________  Waverly Ferrari, M.D.     GMO/MEDQ  D:  12/02/2004  T:  12/02/2004  Job:  [id number]

## 2010-08-05 NOTE — Op Note (Signed)
NAME:  Karen West, Karen West                        ACCOUNT NO.:  [id number]   MEDICAL RECORD NO.:  [id number]                   PATIENT TYPE:  AMB   LOCATION:  ENDO                                 FACILITY:  [facility]   PHYSICIAN:  Waverly Ferrari, M.D.                 DATE OF BIRTH:  [birth year]   DATE OF PROCEDURE:  02/23/2003  DATE OF DISCHARGE:                                 OPERATIVE REPORT   PROCEDURE:  Upper endoscopy.   INDICATIONS:  Hemoccult positivity.   ANESTHESIA:  Demerol 50 mg, Versed 5 mg.   DESCRIPTION OF PROCEDURE:  With the patient mildly sedated in the left  lateral decubitus position, the Olympus videoscopic endoscope was inserted  in the mouth, passed under direct vision through the esophagus, which  appeared normal, into the stomach.  There was a hiatal hernia.  Fundus,  body, antrum, duodenal bulb, and second portion of the duodenum all appeared  normal.  From this point the endoscope was slowly withdrawn taking  circumferential views of the duodenal mucosa until the endoscope had been  pulled back into the stomach, placed in retroflexion to view the stomach  from below.  The endoscope was straightened and withdrawn, taking  circumferential views of the remaining gastric and esophageal mucosa.  The  patient's vital signs and pulse oximetry remained stable.  The patient  tolerated the procedure well without apparent complications.   FINDINGS:  Hiatal hernia, otherwise unremarkable examination.   PLAN:  Proceed to colonoscopy.                                               Waverly Ferrari, M.D.    GMO/MEDQ  D:  02/23/2003  T:  02/23/2003  Job:  [id number]

## 2010-08-05 NOTE — Op Note (Signed)
NAME:  Karen West, Karen West                        ACCOUNT NO.:  [id number]   MEDICAL RECORD NO.:  [id number]                   PATIENT TYPE:  AMB   LOCATION:  ENDO                                 FACILITY:  [facility]   PHYSICIAN:  Waverly Ferrari, M.D.                 DATE OF BIRTH:  [birth year]   DATE OF PROCEDURE:  02/23/2003  DATE OF DISCHARGE:                                 OPERATIVE REPORT   PROCEDURE:  Colonoscopy.   INDICATIONS:  Hemoccult positivity.  The patient had a negative colonoscopy  two years ago.   ANESTHESIA:  Demerol 50 mg, Versed 5 mg additionally.   DESCRIPTION OF PROCEDURE:  With the patient mildly sedated in the left  lateral decubitus position, the Olympus videoscopic variable-stiffness  colonoscope was inserted in the rectum and passed under direct vision to the  ascending colon.  Despite maneuvers, we could not advance it further;  therefore, it was withdrawn taking circumferential views of the colonic  mucosa and a regular colonoscope was inserted into the rectum and passed  under direct vision eventually to the cecum, identified once again by the  ileocecal valve and base of the cecum.  The prep was suboptimal in that  there was solid material that could not be suctioned that appeared to be  mostly vegetable material that was seen,but we cleaned it as best we could  to see if we could get underneath this area to see the mucosa, and from this  point the colonoscope was slowly withdrawn taking circumferential views of  the colonic mucosa, again putting enough water in to move some of the fecal  debris until we could see, and this was done until we reached the rectum,  which appeared normal on direct and showed normal on retroflexed view as  well.  The endoscope was straightened and withdrawn.  The patient's vital  signs and pulse oximetry remained stable.  The patient tolerated the  procedure well without apparent complications.   FINDINGS:  Suboptimal prep, so  therefore lesions could be missed, but no  gross lesions were seen at this time.   PLAN:  Will have the patient follow up with me as an outpatient.                                               Waverly Ferrari, M.D.    GMO/MEDQ  D:  02/23/2003  T:  02/23/2003  Job:  [id number]

## 2010-08-05 NOTE — Procedures (Signed)
[facility]  Patient:    Karen West, Karen West Visit Number: [id number] MRN: [id number]          Service Type: END Location: ENDO Attending Physician:  Jim Desanctis Dictated by:   Jim Desanctis, M.D. Proc. Date: 04/19/01 Admit Date:  04/19/2001                             Procedure Report  PROCEDURE:  Upper endoscopy with biopsy.  INDICATIONS:  Barretts esophagus, GERD.  ANESTHESIA:  Demerol 60 mg, Versed 6 mg.  DESCRIPTION OF PROCEDURE:  With the patient mildly sedated in the left lateral decubitus position, the Olympus videoscopic endoscope was inserted in the mouth, passed under direct vision through the esophagus. The distal esophagus was approached and there appeared to be one short segment of Barretts esophagus, photographed and biopsied. We entered into the stomach through a hiatal hernia. Fundus, body, antrum, duodenal bulb, and second portion of the duodenum were visualized. From this point, the endoscope was slowly withdrawn taking circumferential views of the entire duodenal mucosa until the endoscope was then pulled back into the stomach, placed in retroflexion to view the stomach from below. A patulous GE junction was well visualized above a hiatal hernia. The endoscope was then straightened and withdrawn taking circumferential views of the remaining gastric and esophageal mucosa stopping at the distal fundus at its junction with the body of the stomach, where the folds ended, there was a nodule area to the one of the folds which was photographed and biopsied. The endoscope was withdrawn. The patients vital signs and pulse oximeter remained stable. The patient tolerated the procedure well without apparent complications.  FINDINGS:  Patulous gastroesophageal junction with hiatal hernia with what appeared to be short segment of Barretts esophagus, photographed and biopsied. Nodularity in stomach, biopsied and photographed.  PLAN:  Await  biopsy report. The patient will call Dr. Lajoyce Corners for results and follow up with Dr. Lajoyce Corners as an outpatient. Dictated by:   Jim Desanctis, M.D. Attending Physician:  Jim Desanctis DD:  04/19/01 TD:  04/19/01 Job: [id number]

## 2010-08-05 NOTE — Procedures (Signed)
[facility]  Patient:    Karen West, Karen West                     MRN: [id number] Adm. Date:  [id number] Attending:  Jim Desanctis                           Procedure Report  PROCEDURE:  Upper endoscopy with biopsy.  INDICATIONS FOR PROCEDURE:  Barretts esophagus.  ANESTHESIA:  Demerol 50 mg and Versed 5 mg was given intravenously in divided doses.  DESCRIPTION OF PROCEDURE:  With the patient mildly sedated in the left lateral decubitus position, the Olympus videoscopic endoscope was inserted into the mouth and passed under direct vision though the esophagus.  The distal esophagus was approached and showed changes of possible short segment of Barretts esophagus.  This was photographed and biopsied.  We entered into the stomach.  The fundus, ody and antrum were all well visualized and appeared normal, as did the duodenal bulb and second portion of the duodenum.  Photographs were taken.  From this point, he endoscope was slowly withdrawn, taking circumferential views of the entire duodenal mucosa until the endoscope had been pulled back into the stomach and placed into retroflexion to view the stomach from below.  This showed a hiatal hernia.  The  endoscope was straightened and pulled back from diagnosis to proximal stomach, taking circumferential views of the entire gastric and subsequently esophageal mucosa, which otherwise appeared normal.  The patients vital signs and pulse oximeter remained stable.  The patient tolerated the procedure well without apparent complications.  FINDINGS:  Barretts esophagus above a hiatal hernia.  Short segments only.  PLAN:  Await biopsy report.  The patient will call me for results and follow up  with me as an outpatient. DD:  09/12/99 TD:  09/13/99 Job: [id number]

## 2010-08-25 ENCOUNTER — Other Ambulatory Visit: Payer: Self-pay | Admitting: *Deleted

## 2010-08-25 DIAGNOSIS — R911 Solitary pulmonary nodule: Secondary | ICD-10-CM

## 2010-08-31 ENCOUNTER — Other Ambulatory Visit (HOSPITAL_COMMUNITY): Payer: Self-pay | Admitting: Endocrinology

## 2010-08-31 DIAGNOSIS — E0590 Thyrotoxicosis, unspecified without thyrotoxic crisis or storm: Secondary | ICD-10-CM

## 2010-09-09 ENCOUNTER — Ambulatory Visit (INDEPENDENT_AMBULATORY_CARE_PROVIDER_SITE_OTHER): Payer: Medicare Other | Admitting: Internal Medicine

## 2010-09-09 ENCOUNTER — Encounter: Payer: Self-pay | Admitting: Internal Medicine

## 2010-09-09 ENCOUNTER — Ambulatory Visit (INDEPENDENT_AMBULATORY_CARE_PROVIDER_SITE_OTHER)
Admission: RE | Admit: 2010-09-09 | Discharge: 2010-09-09 | Disposition: A | Discharge status: Home / Self Care | Payer: Medicare Other | Source: Ambulatory Visit | Attending: Internal Medicine | Admitting: Internal Medicine

## 2010-09-09 VITALS — BP 112/68 | HR 91 | Temp 98.2°F | Ht 62.0 in | Wt 197.8 lb

## 2010-09-09 DIAGNOSIS — R911 Solitary pulmonary nodule: Secondary | ICD-10-CM

## 2010-09-09 DIAGNOSIS — J984 Other disorders of lung: Secondary | ICD-10-CM

## 2010-09-09 NOTE — Assessment & Plan Note (Signed)
STable LUL 1.2cm nodule 08/14/2008 -> 12/07/2008  -> 03/08/2009 -> 06/23/2009 -> 02/25/2010 -> 09/09/2010.   NegativePET scan 07/07/2009.  Overall very very low prob for malignancy. WE discussed remote chance of BAC and if she is nervous to get repeat CT chest in 1-2 years but she declined. We discussed differential diagnosis and collectively now feel that is probably related to XRT for breast cancer in past. No further followup planned

## 2010-09-09 NOTE — Patient Instructions (Signed)
No furhter followup needed unless you are having problems

## 2010-09-09 NOTE — Progress Notes (Signed)
  Subjective:    Patient ID: RARITY MCGOWEN, female    DOB: [birth year], 70 y.o.   MRN: [id number]  HPI  : Followup LUL  Pulmonary nodule in 70 year old ex-passive smoker with prior breast cancer. First noted 08/14/2008  as  incidental finding on a [facility] reserach study scan. Confirmed as unchanged 12/07/2008 (4th month scan). Unchanged on followup CT  03/08/2009 but also present was subcentimeter mediastinal nodes (7th month scan). Had another followup CT chest 06/23/2009 (11th  month scan). This showed enlargement of the LUL nodule to 1.3cm (in retrospect dec 2010 nodule measured at 1.2cm and therefore really unchanged) but unchanged  mediastoinal nodes. PET scan done 07/07/2009 shows NO UPTAKE in mediastinum or LUL nodule.   OV 09/09/2010: Had CT 02/25/2010 shows LUL nodule was unchanged. Had repeat Ct chest today (2 year CT chest) - official report pending. To me, the nodule looks unchanged. There are no new abnormalities.  In terms of symptoms she is asymptomatic. No complaints.  Review of Systems The patient denies shortness of breath with activity, shortness of breath at rest, productive cough, non-productive cough,  coughing up blood, chest pain, irregular heartbeats, acid heartburn, indigestion, loss of appetite, weight change, abdominal  pain, difficulty swallowing, sore throat, tooth/dental problems, headaches, nasal congestion/difficulty breathing through nose,  sneezing, itching, ear ache, anxiety, depression, hand/feet swelling, joint stiffness or pain, rash, change in color of mucus,  and fever.     Objective:   Physical Exam    Physical Exam    General:  obese.   Head:  normocephalic and atraumatic   Eyes:  PERRLA/EOM intact; conjunctiva and sclera clear   Ears:  TMs intact and clear with normal canals   Nose:  no deformity, discharge, inflammation, or lesions   Mouth:  MP 3, no oral lesions. DENTURES +   Neck:  no JVD. THYROMEGALY +   Chest Wall:  no deformities noted   Lungs:   clear bilaterally to auscultation and percussion   Heart:  regular rate and rhythm, S1, S2 without murmurs, rubs, gallops, or clicks   Abdomen:  bowel sounds positive; abdomen soft and non-tender without masses, or organomegaly   Msk:  no deformity or scoliosis noted with normal posture   Pulses:  pulses normal   Extremities:  no clubbing, cyanosis, edema, or deformity noted   Neurologic:  CN II-XII grossly intact with normal reflexes, coordination, muscle strength and tone   Skin:  intact without lesions or rashes   Cervical Nodes:  no significant adenopathy   Axillary Nodes:  no significant adenopathy   Psych:  alert and cooperative; normal mood and affect; normal attention span and concentration    Assessment & Plan:

## 2010-09-13 ENCOUNTER — Encounter (HOSPITAL_COMMUNITY)
Admission: RE | Admit: 2010-09-13 | Discharge: 2010-09-13 | Disposition: A | Discharge status: Home / Self Care | Payer: Medicare Other | Source: Ambulatory Visit | Attending: Endocrinology | Admitting: Endocrinology

## 2010-09-13 DIAGNOSIS — E0520 Thyrotoxicosis with toxic multinodular goiter without thyrotoxic crisis or storm: Secondary | ICD-10-CM | POA: Insufficient documentation

## 2010-09-13 DIAGNOSIS — E0590 Thyrotoxicosis, unspecified without thyrotoxic crisis or storm: Secondary | ICD-10-CM

## 2010-09-14 ENCOUNTER — Encounter (HOSPITAL_COMMUNITY)
Admission: RE | Admit: 2010-09-14 | Discharge: 2010-09-14 | Disposition: A | Discharge status: Home / Self Care | Payer: Medicare Other | Source: Ambulatory Visit | Attending: Endocrinology | Admitting: Endocrinology

## 2010-09-14 MED ORDER — SODIUM IODIDE I 131 CAPSULE
10.0000 | Freq: Once | INTRAVENOUS | Status: AC | PRN
  Administered 2010-09-13: 10 via ORAL

## 2010-09-14 MED ORDER — SODIUM PERTECHNETATE TC 99M INJECTION
10.0000 | Freq: Once | INTRAVENOUS | Status: AC | PRN
  Administered 2010-09-14: 10 via INTRAVENOUS

## 2010-11-17 ENCOUNTER — Other Ambulatory Visit: Payer: Self-pay | Admitting: Internal Medicine

## 2010-11-17 DIAGNOSIS — D1800 Hemangioma unspecified site: Secondary | ICD-10-CM

## 2010-11-24 ENCOUNTER — Other Ambulatory Visit (HOSPITAL_COMMUNITY): Payer: Self-pay | Admitting: Oncology

## 2010-11-24 ENCOUNTER — Encounter (HOSPITAL_BASED_OUTPATIENT_CLINIC_OR_DEPARTMENT_OTHER): Payer: Medicare Other | Admitting: Oncology

## 2010-11-24 DIAGNOSIS — D509 Iron deficiency anemia, unspecified: Secondary | ICD-10-CM

## 2010-11-24 LAB — CBC WITH DIFFERENTIAL/PLATELET
BASO%: 0.3 % (ref 0.0–2.0)
Basophils Absolute: 0 10*3/uL (ref 0.0–0.1)
EOS%: 2.1 % (ref 0.0–7.0)
Eosinophils Absolute: 0.1 10*3/uL (ref 0.0–0.5)
HCT: 33.9 % — ABNORMAL LOW (ref 34.8–46.6)
HGB: 11.5 g/dL — ABNORMAL LOW (ref 11.6–15.9)
LYMPH%: 26.5 % (ref 14.0–49.7)
MCH: 29.8 pg (ref 25.1–34.0)
MCHC: 33.9 g/dL (ref 31.5–36.0)
MCV: 87.9 fL (ref 79.5–101.0)
MONO#: 0.4 10*3/uL (ref 0.1–0.9)
MONO%: 8.3 % (ref 0.0–14.0)
NEUT#: 3.1 10*3/uL (ref 1.5–6.5)
NEUT%: 62.8 % (ref 38.4–76.8)
Platelets: 266 10*3/uL (ref 145–400)
RBC: 3.86 10*6/uL (ref 3.70–5.45)
RDW: 14.6 % — ABNORMAL HIGH (ref 11.2–14.5)
WBC: 5 10*3/uL (ref 3.9–10.3)
lymph#: 1.3 10*3/uL (ref 0.9–3.3)

## 2010-11-25 ENCOUNTER — Ambulatory Visit
Admission: RE | Admit: 2010-11-25 | Discharge: 2010-11-25 | Disposition: A | Discharge status: Home / Self Care | Payer: Medicare Other | Source: Ambulatory Visit | Attending: Internal Medicine | Admitting: Internal Medicine

## 2010-11-25 DIAGNOSIS — D1800 Hemangioma unspecified site: Secondary | ICD-10-CM

## 2010-11-25 MED ORDER — GADOBENATE DIMEGLUMINE 529 MG/ML IV SOLN
18.0000 mL | Freq: Once | INTRAVENOUS | Status: AC | PRN
  Administered 2010-11-25: 18 mL via INTRAVENOUS

## 2010-11-28 LAB — COMPREHENSIVE METABOLIC PANEL
ALT: 12 U/L (ref 0–35)
AST: 17 U/L (ref 0–37)
Albumin: 4.4 g/dL (ref 3.5–5.2)
Alkaline Phosphatase: 75 U/L (ref 39–117)
BUN: 31 mg/dL — ABNORMAL HIGH (ref 6–23)
CO2: 25 mEq/L (ref 19–32)
Calcium: 10.2 mg/dL (ref 8.4–10.5)
Chloride: 104 mEq/L (ref 96–112)
Creatinine, Ser: 0.94 mg/dL (ref 0.50–1.10)
Glucose, Bld: 85 mg/dL (ref 70–99)
Potassium: 3.9 mEq/L (ref 3.5–5.3)
Sodium: 139 mEq/L (ref 135–145)
Total Bilirubin: 0.4 mg/dL (ref 0.3–1.2)
Total Protein: 7.5 g/dL (ref 6.0–8.3)

## 2010-11-28 LAB — TRANSFERRIN RECEPTOR, SOLUABLE: Transferrin Receptor, Soluble: 2.1 mg/L — ABNORMAL HIGH (ref 0.76–1.76)

## 2010-11-28 LAB — FERRITIN: Ferritin: 34 ng/mL (ref 10–291)

## 2010-11-28 LAB — IRON AND TIBC
%SAT: 20 % (ref 20–55)
Iron: 72 ug/dL (ref 42–145)
TIBC: 357 ug/dL (ref 250–470)
UIBC: 285 ug/dL (ref 125–400)

## 2010-11-28 LAB — LACTATE DEHYDROGENASE: LDH: 206 U/L (ref 94–250)

## 2010-11-30 ENCOUNTER — Other Ambulatory Visit (HOSPITAL_COMMUNITY): Payer: Self-pay | Admitting: Endocrinology

## 2010-12-09 ENCOUNTER — Ambulatory Visit (HOSPITAL_COMMUNITY): Payer: Medicare Other

## 2010-12-21 LAB — CBC
HCT: 26 — ABNORMAL LOW
HCT: 26.9 — ABNORMAL LOW
HCT: 27.9 — ABNORMAL LOW
HCT: 28.8 — ABNORMAL LOW
HCT: 29.7 — ABNORMAL LOW
HCT: 29.8 — ABNORMAL LOW
HCT: 30.3 — ABNORMAL LOW
HCT: 30.5 — ABNORMAL LOW
HCT: 32 — ABNORMAL LOW
Hemoglobin: 10 — ABNORMAL LOW
Hemoglobin: 10.4 — ABNORMAL LOW
Hemoglobin: 8.8 — ABNORMAL LOW
Hemoglobin: 8.8 — ABNORMAL LOW
Hemoglobin: 9.3 — ABNORMAL LOW
Hemoglobin: 9.4 — ABNORMAL LOW
Hemoglobin: 9.8 — ABNORMAL LOW
Hemoglobin: 9.9 — ABNORMAL LOW
Hemoglobin: 9.9 — ABNORMAL LOW
MCHC: 32.5
MCHC: 32.6
MCHC: 32.7
MCHC: 32.8
MCHC: 32.8
MCHC: 33.2
MCHC: 33.2
MCHC: 33.4
MCHC: 33.8
MCV: 84.9
MCV: 85
MCV: 85.3
MCV: 85.6
MCV: 85.7
MCV: 85.7
MCV: 86.1
MCV: 86.3
MCV: 86.4
Platelets: 290
Platelets: 328
Platelets: 335
Platelets: 347
Platelets: 348
Platelets: 350
Platelets: 358
Platelets: 359
Platelets: 384
RBC: 3.06 — ABNORMAL LOW
RBC: 3.14 — ABNORMAL LOW
RBC: 3.29 — ABNORMAL LOW
RBC: 3.34 — ABNORMAL LOW
RBC: 3.47 — ABNORMAL LOW
RBC: 3.49 — ABNORMAL LOW
RBC: 3.51 — ABNORMAL LOW
RBC: 3.56 — ABNORMAL LOW
RBC: 3.7 — ABNORMAL LOW
RDW: 16.6 — ABNORMAL HIGH
RDW: 16.6 — ABNORMAL HIGH
RDW: 16.8 — ABNORMAL HIGH
RDW: 16.8 — ABNORMAL HIGH
RDW: 16.9 — ABNORMAL HIGH
RDW: 16.9 — ABNORMAL HIGH
RDW: 16.9 — ABNORMAL HIGH
RDW: 16.9 — ABNORMAL HIGH
RDW: 17.1 — ABNORMAL HIGH
WBC: 5.5
WBC: 5.5
WBC: 5.8
WBC: 5.8
WBC: 6.2
WBC: 6.6
WBC: 6.7
WBC: 7.3
WBC: 9.6

## 2010-12-21 LAB — DIFFERENTIAL
Basophils Absolute: 0
Basophils Absolute: 0
Basophils Relative: 0
Basophils Relative: 0
Eosinophils Absolute: 0
Eosinophils Absolute: 0.1
Eosinophils Relative: 0
Eosinophils Relative: 1
Lymphocytes Relative: 24
Lymphocytes Relative: 30
Lymphs Abs: 1.6
Lymphs Abs: 1.6
Monocytes Absolute: 0.5
Monocytes Absolute: 0.5
Monocytes Relative: 7
Monocytes Relative: 9
Neutro Abs: 3.3
Neutro Abs: 4.6
Neutrophils Relative %: 60
Neutrophils Relative %: 68

## 2010-12-21 LAB — GLUCOSE, CAPILLARY
Glucose-Capillary: 100 — ABNORMAL HIGH
Glucose-Capillary: 102 — ABNORMAL HIGH
Glucose-Capillary: 102 — ABNORMAL HIGH
Glucose-Capillary: 102 — ABNORMAL HIGH
Glucose-Capillary: 103 — ABNORMAL HIGH
Glucose-Capillary: 104 — ABNORMAL HIGH
Glucose-Capillary: 105 — ABNORMAL HIGH
Glucose-Capillary: 108 — ABNORMAL HIGH
Glucose-Capillary: 116 — ABNORMAL HIGH
Glucose-Capillary: 125 — ABNORMAL HIGH
Glucose-Capillary: 125 — ABNORMAL HIGH
Glucose-Capillary: 125 — ABNORMAL HIGH
Glucose-Capillary: 135 — ABNORMAL HIGH
Glucose-Capillary: 142 — ABNORMAL HIGH
Glucose-Capillary: 146 — ABNORMAL HIGH
Glucose-Capillary: 164 — ABNORMAL HIGH
Glucose-Capillary: 193 — ABNORMAL HIGH
Glucose-Capillary: 196 — ABNORMAL HIGH
Glucose-Capillary: 223 — ABNORMAL HIGH
Glucose-Capillary: 237 — ABNORMAL HIGH
Glucose-Capillary: 84
Glucose-Capillary: 95

## 2010-12-21 LAB — BASIC METABOLIC PANEL
BUN: 11
BUN: 12
BUN: 22
BUN: 9
CO2: 26
CO2: 26
CO2: 27
CO2: 29
Calcium: 10.2
Calcium: 9.4
Calcium: 9.7
Calcium: 9.9
Chloride: 102
Chloride: 106
Chloride: 107
Chloride: 110
Creatinine, Ser: 0.78
Creatinine, Ser: 0.83
Creatinine, Ser: 0.85
Creatinine, Ser: 0.88
GFR calc Af Amer: >60
GFR calc Af Amer: >60
GFR calc Af Amer: >60
GFR calc Af Amer: >60
GFR calc non Af Amer: >60
GFR calc non Af Amer: >60
GFR calc non Af Amer: >60
GFR calc non Af Amer: >60
Glucose, Bld: 114 — ABNORMAL HIGH
Glucose, Bld: 118 — ABNORMAL HIGH
Glucose, Bld: 126 — ABNORMAL HIGH
Glucose, Bld: 84
Potassium: 3.4 — ABNORMAL LOW
Potassium: 3.8
Potassium: 3.9
Potassium: 4
Sodium: 140
Sodium: 140
Sodium: 141
Sodium: 144

## 2010-12-21 LAB — ABO/RH: ABO/RH(D): B POS

## 2010-12-21 LAB — URINALYSIS, ROUTINE W REFLEX MICROSCOPIC
Bilirubin Urine: NEGATIVE
Glucose, UA: NEGATIVE
Hgb urine dipstick: NEGATIVE
Ketones, ur: NEGATIVE
Nitrite: NEGATIVE
Protein, ur: NEGATIVE
Specific Gravity, Urine: 1.02
Urobilinogen, UA: 1
pH: 5.5

## 2010-12-21 LAB — COMPREHENSIVE METABOLIC PANEL
ALT: 15
AST: 15
Albumin: 4.1
Alkaline Phosphatase: 81
BUN: 32 — ABNORMAL HIGH
CO2: 28
Calcium: 10.3
Chloride: 99
Creatinine, Ser: 0.87
GFR calc Af Amer: >60
GFR calc non Af Amer: >60
Glucose, Bld: 116 — ABNORMAL HIGH
Potassium: 3.6
Sodium: 135
Total Bilirubin: 0.6
Total Protein: 7.4

## 2010-12-21 LAB — CROSSMATCH
ABO/RH(D): B POS
Antibody Screen: NEGATIVE

## 2010-12-21 LAB — GASTRIC OCCULT BLOOD (1-CARD TO LAB): Occult Blood, Gastric: POSITIVE — AB

## 2010-12-21 LAB — URINE CULTURE
Colony Count: 50000
Special Requests: NEGATIVE

## 2010-12-21 LAB — PROTIME-INR
INR: 0.9
Prothrombin Time: 12.6

## 2010-12-21 LAB — OCCULT BLOOD X 1 CARD TO LAB, STOOL: Fecal Occult Bld: NEGATIVE

## 2010-12-21 LAB — POCT CARDIAC MARKERS
CKMB, poc: <1 — ABNORMAL LOW
Myoglobin, poc: 142
Troponin i, poc: <0.05

## 2010-12-21 LAB — URINE MICROSCOPIC-ADD ON

## 2011-04-04 ENCOUNTER — Other Ambulatory Visit: Payer: Self-pay | Admitting: Obstetrics and Gynecology

## 2011-04-04 DIAGNOSIS — Z9010 Acquired absence of unspecified breast and nipple: Secondary | ICD-10-CM

## 2011-04-04 DIAGNOSIS — Z1231 Encounter for screening mammogram for malignant neoplasm of breast: Secondary | ICD-10-CM

## 2011-05-15 ENCOUNTER — Ambulatory Visit
Admission: RE | Admit: 2011-05-15 | Discharge: 2011-05-15 | Disposition: A | Discharge status: Home / Self Care | Payer: Medicare Other | Source: Ambulatory Visit | Attending: Obstetrics and Gynecology | Admitting: Obstetrics and Gynecology

## 2011-05-15 DIAGNOSIS — Z1231 Encounter for screening mammogram for malignant neoplasm of breast: Secondary | ICD-10-CM

## 2011-05-15 DIAGNOSIS — Z9010 Acquired absence of unspecified breast and nipple: Secondary | ICD-10-CM

## 2011-05-24 ENCOUNTER — Telehealth: Payer: Self-pay | Admitting: Oncology

## 2011-05-24 NOTE — Telephone Encounter (Signed)
pt unable to keep 3/7 appt and r/s to 4/2 aom

## 2011-05-25 ENCOUNTER — Other Ambulatory Visit: Payer: Medicare Other | Admitting: Lab

## 2011-05-25 ENCOUNTER — Ambulatory Visit: Payer: Medicare Other | Admitting: Physician Assistant

## 2011-05-25 ENCOUNTER — Ambulatory Visit: Payer: Medicare Other | Admitting: Oncology

## 2011-06-20 ENCOUNTER — Encounter: Payer: Self-pay | Admitting: Physician Assistant

## 2011-06-20 ENCOUNTER — Ambulatory Visit (HOSPITAL_BASED_OUTPATIENT_CLINIC_OR_DEPARTMENT_OTHER): Payer: Medicare Other | Admitting: Physician Assistant

## 2011-06-20 ENCOUNTER — Other Ambulatory Visit (HOSPITAL_BASED_OUTPATIENT_CLINIC_OR_DEPARTMENT_OTHER): Payer: Medicare Other | Admitting: Lab

## 2011-06-20 VITALS — BP 132/64 | HR 76 | Temp 97.0°F | Ht 62.0 in | Wt 190.3 lb

## 2011-06-20 DIAGNOSIS — D509 Iron deficiency anemia, unspecified: Secondary | ICD-10-CM

## 2011-06-20 LAB — COMPREHENSIVE METABOLIC PANEL
ALT: 19 U/L (ref 0–35)
AST: 23 U/L (ref 0–37)
Albumin: 4.2 g/dL (ref 3.5–5.2)
Alkaline Phosphatase: 74 U/L (ref 39–117)
BUN: 21 mg/dL (ref 6–23)
CO2: 26 mEq/L (ref 19–32)
Calcium: 9.9 mg/dL (ref 8.4–10.5)
Chloride: 105 mEq/L (ref 96–112)
Creatinine, Ser: 0.95 mg/dL (ref 0.50–1.10)
Glucose, Bld: 74 mg/dL (ref 70–99)
Potassium: 4.2 mEq/L (ref 3.5–5.3)
Sodium: 141 mEq/L (ref 135–145)
Total Bilirubin: 0.3 mg/dL (ref 0.3–1.2)
Total Protein: 7.1 g/dL (ref 6.0–8.3)

## 2011-06-20 LAB — CBC WITH DIFFERENTIAL/PLATELET
BASO%: 0.5 % (ref 0.0–2.0)
Basophils Absolute: 0 10*3/uL (ref 0.0–0.1)
EOS%: 2.9 % (ref 0.0–7.0)
Eosinophils Absolute: 0.2 10*3/uL (ref 0.0–0.5)
HCT: 34.3 % — ABNORMAL LOW (ref 34.8–46.6)
HGB: 11.5 g/dL — ABNORMAL LOW (ref 11.6–15.9)
LYMPH%: 24.2 % (ref 14.0–49.7)
MCH: 29.9 pg (ref 25.1–34.0)
MCHC: 33.6 g/dL (ref 31.5–36.0)
MCV: 88.8 fL (ref 79.5–101.0)
MONO#: 0.6 10*3/uL (ref 0.1–0.9)
MONO%: 9.4 % (ref 0.0–14.0)
NEUT#: 4.2 10*3/uL (ref 1.5–6.5)
NEUT%: 63 % (ref 38.4–76.8)
Platelets: 262 10*3/uL (ref 145–400)
RBC: 3.86 10*6/uL (ref 3.70–5.45)
RDW: 13.8 % (ref 11.2–14.5)
WBC: 6.7 10*3/uL (ref 3.9–10.3)
lymph#: 1.6 10*3/uL (ref 0.9–3.3)
nRBC: 0 % (ref 0–0)

## 2011-06-20 LAB — FERRITIN: Ferritin: 43 ng/mL (ref 10–291)

## 2011-06-20 LAB — LACTATE DEHYDROGENASE: LDH: 184 U/L (ref 94–250)

## 2011-06-20 LAB — IRON AND TIBC
%SAT: 16 % — ABNORMAL LOW (ref 20–55)
Iron: 48 ug/dL (ref 42–145)
TIBC: 302 ug/dL (ref 250–470)
UIBC: 254 ug/dL (ref 125–400)

## 2011-06-20 NOTE — Progress Notes (Signed)
[facility] OFFICE PROGRESS NOTE  Horatio Pel, MD, MD  INTERVAL HISTORY: :  Tressa Jacobson was seen today for followup of her iron-deficiency anemia.  The patient was last seen here on 11/24/2010.  She has been on  Iron  twice a day.  The patient denies any major changes in her condition.  She denies any pagophagia. She has been trying to remain active, and has lost about 10 lbs since last visit due to increase in nutritious foods and decrease intake of starch rich products. She feels more energetic since.   We had first seen Mrs. Hiers on 01/14/2009, at which time her hemoglobin was 9.3, hematocrit 29.2.  She had iron-deficiency anemia.  She had undergone a GI workup by Dr. Anson Fret consisting of endoscopy and colonoscopy in December 2010.  Findings were negative.  The patient also has a history of cancer of the left breast.  She is due to see Dr. Leo Grosser for her breast exam in October.  The patient did have a mammogram of her right breast at the [facility] on 05/15/2011.  Breast tissue was almost entirely fatty.  There was no evidence for malignancy.  The patient was given a copy of her mammogram report.  In addition, she has undergone a CT scan of the chest without IV contrast on September 09, 2010.  There is a noncalcified pulmonary nodule seen in the central left upper lobe measuring 10 x 13 mm which remains stable and has not changed significantly compared with prior exams that date back to 12/07/2008.  The patient had a thyroid scan uptake on September 14, 2010.  She has an enlarged multinodular thyroid goiter with normal uptake of 20.5%.  She also had an MRI of the head with and without IV contrast on 11/25/2010 for evaluation of Headaches. 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  No mass effect on the brain and no significant findings .Her last Ferritin levels on 11/24/2010 was 34 (56  on 05/24/10). Iron was 72 (89 on 05/24/10), TIBC was 357 (332 on 05/24/10) and percent saturation was 20 (27 on 05/24/10). LDH was 206 (199 on 3/6). H/H  Today is   11.5/34.3,  platelets 262 and WBC 6.7.MCV is 88.8.  In September 2012 H/H was 11.5/34.3, WBC was 5, platelets 266 and MCV 87.9  Transferrin was 2.1 (1.65 on 3/6) Chemistries were unremarkable. New chemistries pending.   MEDICAL HISTORY: Past Medical History  Diagnosis Date  . Hyperlipidemia   . Hypertension   . OSA on CPAP   . Breast cancer     s/p left mastectomy  . Chronic anemia   . GERD (gastroesophageal reflux disease)   . Obesity   . Osteoarthritis   . Right bundle branch block   . Atherosclerosis of aorta   . Multinodular goiter     SURGICAL HISTORY:  Past Surgical History  Procedure Date  . Mastectomy     left  . Tubal ligation     MEDICATIONS: Current Outpatient Prescriptions  Medication Sig Dispense Refill  . acetaminophen (TYLENOL) 325 MG tablet Take 650 mg by mouth every 6 (six) hours as needed.        Marland Kitchen amLODipine-atorvastatin (CADUET) 10-80 MG per tablet Take 1 tablet by mouth daily.        Marland Kitchen aspirin 81 MG tablet Take 81 mg by mouth daily.        Marland Kitchen  ezetimibe (ZETIA) 10 MG tablet Take 10 mg by mouth daily.        . fish oil-omega-3 fatty acids 1000 MG capsule Take 2 g by mouth daily.        Marland Kitchen glipiZIDE (GLUCOTROL) 10 MG 24 hr tablet Take 10 mg by mouth 2 (two) times daily.        Marland Kitchen lisinopril-hydrochlorothiazide (PRINZIDE,ZESTORETIC) 20-12.5 MG per tablet Take 1 tablet by mouth daily.        . Misc Natural Products (OSTEO BI-FLEX JOINT SHIELD PO) Take by mouth 2 (two) times daily.        Marland Kitchen omeprazole (PRILOSEC) 20 MG capsule Take 20 mg by mouth daily.        . pioglitazone-metformin (ACTOPLUS MET) 15-850 MG per tablet Take 1 tablet by mouth 2 (two) times daily with a meal.        . polyethylene glycol powder (MIRALAX) powder Take 17 g by mouth as needed.          ALLERGIES:   has no known  allergies.  REVIEW OF SYSTEMS:  The rest of the 14-point review of system was negative.   Filed Vitals:   06/20/11 0925  BP: 132/64  Pulse: 76  Temp: 97 F (36.1 C)   Wt Readings from Last 3 Encounters:  06/20/11 190 lb 4.8 oz (86.32 kg)  09/09/10 197 lb 12.8 oz (89.721 kg)  11/17/09 198 lb 6.1 oz (89.985 kg)     PHYSICAL EXAMINATION:  HEENT:  There is no scleral icterus.  Mouth and pharynx benign.  Nodes:  No peripheral adenopathy palpable.  Neck:  She has a goiter.  Lungs:  Clear.  Cardiac Exam:  Regular rhythm with systolic ejection murmur.  Abdomen:  Obese with the patient sitting.  Extremities:  No peripheral edema.  Breasts:  Not examined as she will be seeing Dr. Leo Grosser in October.   LABORATORY/RADIOLOGY DATA:   Lab 06/20/11 0858  WBC 6.7  HGB 11.5*  HCT 34.3*  PLT 262  MCV 88.8  MCH 29.9  MCHC 33.6  RDW 13.8  LYMPHSABS 1.6  MONOABS 0.6  EOSABS 0.2  BASOSABS 0.0  BANDABS --    CMP   No results found for this basename: NA:5,K:5,CL:5,CO2:5,GLUCOSE:5,BUN:5,CREATININE:5,GFRCGP,:5,CALCIUM:5,MG:5,AST:5,ALT:5,ALKPHOS:5,BILITOT:5 in the last 168 hours      Component Value Date/Time   BILITOT 0.4 11/24/2010 1133   BILITOT 0.4 11/24/2010 1133   BILITOT 0.4 11/24/2010 1133     Radiology Studies:   DG SCREENING MAMMOGRAM RIGHT  RIGHT DIGITAL SCREENING MAMMOGRAM WITH CAD:  There are scattered fibroglandular densities. No masses or malignant type calcifications are identified. Compared with prior studies.  Images were processed with CAD.  IMPRESSION:  No specific mammographic evidence of malignancy. Next screening mammogram is recommended in one year.  A result letter of this screening mammogram will be mailed directly to the patient. ASSESSMENT: Negative - BI-RADS 1 Screening mammogram in 1 year.    MRI HEAD WITHOUT AND WITH CONTRAST  Comparison: 07/11/2010.  Findings: Redemonstrated is a corticated right frontal likely osseous lesion projecting into the  subarachnoid space over the convexity on the right. No appreciable enhancement. Lesion measures roughly 1 x 1 x 2 cm. The findings likely represent a right frontal osteoma or asymmetric hyperostosis. Doubt meningioma. No significant change to suggest acute or active process such as breast cancer. No areas of marrow replacement. Mild generalized atrophy and mild chronic microvascular ischemic change. No acute stroke,hemorrhage, mass lesion, hydrocephalus. No intra-axial vascular malformations. Carotid and  basilar arteries widely patent. No midline abnormality. No acute sinus, orbital, or mastoid process. Little change from priors.  IMPRESSION:  Atrophy and small vessel disease. No acute intracranial findings. Unchanged right frontal extra-axial process, likely osteoma or asymmetric hyperostosis.      ASSESSMENT AND PLAN:   Clinically, Ms. Beiter seems to be doing well.  She is now 71 years old.  She wishes to continue the monitoring of her counts by her primary physician . Dr. Ralene Ok has evaluated the lab results and agreed to see her as needed. He has instructed the patient to check her CBC  And Iron studies with Ferritin on a regular basis as well as to continue the oral Iron intake twice a day to maintain adequate lab values. Hemoglobin/hematocrit are currently stable.  She may be dependent on taking iron indefinitely to maintain her iron stores. She knows to call us if she has any questions or concerns. It has been a pleasure to provide medical care to Ms. Papa.

## 2011-06-29 DIAGNOSIS — E119 Type 2 diabetes mellitus without complications: Secondary | ICD-10-CM | POA: Diagnosis not present

## 2011-06-29 DIAGNOSIS — E7800 Pure hypercholesterolemia, unspecified: Secondary | ICD-10-CM | POA: Diagnosis not present

## 2011-07-03 DIAGNOSIS — IMO0001 Reserved for inherently not codable concepts without codable children: Secondary | ICD-10-CM | POA: Diagnosis not present

## 2011-07-03 DIAGNOSIS — E7800 Pure hypercholesterolemia, unspecified: Secondary | ICD-10-CM | POA: Diagnosis not present

## 2011-07-03 DIAGNOSIS — I10 Essential (primary) hypertension: Secondary | ICD-10-CM | POA: Diagnosis not present

## 2011-07-03 DIAGNOSIS — E119 Type 2 diabetes mellitus without complications: Secondary | ICD-10-CM | POA: Diagnosis not present

## 2011-07-04 ENCOUNTER — Other Ambulatory Visit: Payer: Self-pay | Admitting: Internal Medicine

## 2011-07-04 DIAGNOSIS — IMO0001 Reserved for inherently not codable concepts without codable children: Secondary | ICD-10-CM | POA: Diagnosis not present

## 2011-07-04 DIAGNOSIS — Z0000 Encounter for general adult medical examination without abnormal findings: Secondary | ICD-10-CM | POA: Diagnosis not present

## 2011-07-04 DIAGNOSIS — E119 Type 2 diabetes mellitus without complications: Secondary | ICD-10-CM | POA: Diagnosis not present

## 2011-07-04 DIAGNOSIS — E7800 Pure hypercholesterolemia, unspecified: Secondary | ICD-10-CM | POA: Diagnosis not present

## 2011-07-04 DIAGNOSIS — I10 Essential (primary) hypertension: Secondary | ICD-10-CM | POA: Diagnosis not present

## 2011-07-04 DIAGNOSIS — I6529 Occlusion and stenosis of unspecified carotid artery: Secondary | ICD-10-CM

## 2011-07-04 DIAGNOSIS — R51 Headache: Secondary | ICD-10-CM | POA: Diagnosis not present

## 2011-07-06 DIAGNOSIS — L608 Other nail disorders: Secondary | ICD-10-CM | POA: Diagnosis not present

## 2011-07-06 DIAGNOSIS — E1159 Type 2 diabetes mellitus with other circulatory complications: Secondary | ICD-10-CM | POA: Diagnosis not present

## 2011-07-06 DIAGNOSIS — I739 Peripheral vascular disease, unspecified: Secondary | ICD-10-CM | POA: Diagnosis not present

## 2011-07-06 DIAGNOSIS — L988 Other specified disorders of the skin and subcutaneous tissue: Secondary | ICD-10-CM | POA: Diagnosis not present

## 2011-07-07 ENCOUNTER — Ambulatory Visit
Admission: RE | Admit: 2011-07-07 | Discharge: 2011-07-07 | Disposition: A | Discharge status: Home / Self Care | Payer: Medicare Other | Source: Ambulatory Visit | Attending: Internal Medicine | Admitting: Internal Medicine

## 2011-07-07 DIAGNOSIS — I6529 Occlusion and stenosis of unspecified carotid artery: Secondary | ICD-10-CM

## 2011-07-10 DIAGNOSIS — K5900 Constipation, unspecified: Secondary | ICD-10-CM | POA: Diagnosis not present

## 2011-07-10 DIAGNOSIS — K219 Gastro-esophageal reflux disease without esophagitis: Secondary | ICD-10-CM | POA: Diagnosis not present

## 2011-07-10 DIAGNOSIS — R1310 Dysphagia, unspecified: Secondary | ICD-10-CM | POA: Diagnosis not present

## 2011-07-10 DIAGNOSIS — R112 Nausea with vomiting, unspecified: Secondary | ICD-10-CM | POA: Diagnosis not present

## 2011-07-11 DIAGNOSIS — I10 Essential (primary) hypertension: Secondary | ICD-10-CM | POA: Diagnosis not present

## 2011-07-11 DIAGNOSIS — E119 Type 2 diabetes mellitus without complications: Secondary | ICD-10-CM | POA: Diagnosis not present

## 2011-07-11 DIAGNOSIS — E0500 Thyrotoxicosis with diffuse goiter without thyrotoxic crisis or storm: Secondary | ICD-10-CM | POA: Diagnosis not present

## 2011-07-18 DIAGNOSIS — I10 Essential (primary) hypertension: Secondary | ICD-10-CM | POA: Diagnosis not present

## 2011-07-18 DIAGNOSIS — E0500 Thyrotoxicosis with diffuse goiter without thyrotoxic crisis or storm: Secondary | ICD-10-CM | POA: Diagnosis not present

## 2011-08-07 DIAGNOSIS — E119 Type 2 diabetes mellitus without complications: Secondary | ICD-10-CM | POA: Diagnosis not present

## 2011-08-07 DIAGNOSIS — H2510 Age-related nuclear cataract, unspecified eye: Secondary | ICD-10-CM | POA: Diagnosis not present

## 2011-08-08 DIAGNOSIS — D126 Benign neoplasm of colon, unspecified: Secondary | ICD-10-CM | POA: Diagnosis not present

## 2011-08-08 DIAGNOSIS — K5730 Diverticulosis of large intestine without perforation or abscess without bleeding: Secondary | ICD-10-CM | POA: Diagnosis not present

## 2011-08-08 DIAGNOSIS — R1310 Dysphagia, unspecified: Secondary | ICD-10-CM | POA: Diagnosis not present

## 2011-08-08 DIAGNOSIS — K209 Esophagitis, unspecified without bleeding: Secondary | ICD-10-CM | POA: Diagnosis not present

## 2011-08-08 DIAGNOSIS — K449 Diaphragmatic hernia without obstruction or gangrene: Secondary | ICD-10-CM | POA: Diagnosis not present

## 2011-08-08 DIAGNOSIS — Z1211 Encounter for screening for malignant neoplasm of colon: Secondary | ICD-10-CM | POA: Diagnosis not present

## 2011-08-08 DIAGNOSIS — K649 Unspecified hemorrhoids: Secondary | ICD-10-CM | POA: Diagnosis not present

## 2011-08-16 DIAGNOSIS — M1710 Unilateral primary osteoarthritis, unspecified knee: Secondary | ICD-10-CM | POA: Diagnosis not present

## 2011-08-16 DIAGNOSIS — IMO0002 Reserved for concepts with insufficient information to code with codable children: Secondary | ICD-10-CM | POA: Diagnosis not present

## 2011-09-11 DIAGNOSIS — K5900 Constipation, unspecified: Secondary | ICD-10-CM | POA: Diagnosis not present

## 2011-09-11 DIAGNOSIS — K219 Gastro-esophageal reflux disease without esophagitis: Secondary | ICD-10-CM | POA: Diagnosis not present

## 2011-09-25 DIAGNOSIS — E0590 Thyrotoxicosis, unspecified without thyrotoxic crisis or storm: Secondary | ICD-10-CM | POA: Diagnosis not present

## 2011-09-25 DIAGNOSIS — E559 Vitamin D deficiency, unspecified: Secondary | ICD-10-CM | POA: Diagnosis not present

## 2011-09-25 DIAGNOSIS — D649 Anemia, unspecified: Secondary | ICD-10-CM | POA: Diagnosis not present

## 2011-09-25 DIAGNOSIS — E119 Type 2 diabetes mellitus without complications: Secondary | ICD-10-CM | POA: Diagnosis not present

## 2011-09-25 DIAGNOSIS — E7800 Pure hypercholesterolemia, unspecified: Secondary | ICD-10-CM | POA: Diagnosis not present

## 2011-10-01 DIAGNOSIS — E119 Type 2 diabetes mellitus without complications: Secondary | ICD-10-CM | POA: Diagnosis not present

## 2011-10-01 DIAGNOSIS — R079 Chest pain, unspecified: Secondary | ICD-10-CM | POA: Diagnosis not present

## 2011-10-01 DIAGNOSIS — J9819 Other pulmonary collapse: Secondary | ICD-10-CM | POA: Diagnosis not present

## 2011-10-01 DIAGNOSIS — R0789 Other chest pain: Secondary | ICD-10-CM | POA: Diagnosis not present

## 2011-10-01 DIAGNOSIS — I10 Essential (primary) hypertension: Secondary | ICD-10-CM | POA: Diagnosis not present

## 2011-10-01 DIAGNOSIS — E785 Hyperlipidemia, unspecified: Secondary | ICD-10-CM | POA: Diagnosis not present

## 2011-10-02 DIAGNOSIS — R0789 Other chest pain: Secondary | ICD-10-CM | POA: Diagnosis not present

## 2011-10-02 DIAGNOSIS — E785 Hyperlipidemia, unspecified: Secondary | ICD-10-CM | POA: Diagnosis not present

## 2011-10-02 DIAGNOSIS — E119 Type 2 diabetes mellitus without complications: Secondary | ICD-10-CM | POA: Diagnosis not present

## 2011-10-02 DIAGNOSIS — I10 Essential (primary) hypertension: Secondary | ICD-10-CM | POA: Diagnosis not present

## 2011-10-03 DIAGNOSIS — E7800 Pure hypercholesterolemia, unspecified: Secondary | ICD-10-CM | POA: Diagnosis not present

## 2011-10-03 DIAGNOSIS — IMO0001 Reserved for inherently not codable concepts without codable children: Secondary | ICD-10-CM | POA: Diagnosis not present

## 2011-10-03 DIAGNOSIS — I10 Essential (primary) hypertension: Secondary | ICD-10-CM | POA: Diagnosis not present

## 2011-10-04 ENCOUNTER — Encounter (HOSPITAL_COMMUNITY): Payer: Self-pay

## 2011-10-04 ENCOUNTER — Inpatient Hospital Stay (HOSPITAL_COMMUNITY)
Admission: EM | Admit: 2011-10-04 | Discharge: 2011-10-06 | DRG: 103 | Disposition: A | Discharge status: Home Health | Payer: Medicare Other | Attending: Family Medicine | Admitting: Family Medicine

## 2011-10-04 ENCOUNTER — Emergency Department (HOSPITAL_COMMUNITY): Payer: Medicare Other

## 2011-10-04 DIAGNOSIS — IMO0001 Reserved for inherently not codable concepts without codable children: Secondary | ICD-10-CM | POA: Diagnosis present

## 2011-10-04 DIAGNOSIS — I208 Other forms of angina pectoris: Secondary | ICD-10-CM | POA: Diagnosis present

## 2011-10-04 DIAGNOSIS — I369 Nonrheumatic tricuspid valve disorder, unspecified: Secondary | ICD-10-CM | POA: Diagnosis not present

## 2011-10-04 DIAGNOSIS — Z823 Family history of stroke: Secondary | ICD-10-CM

## 2011-10-04 DIAGNOSIS — R51 Headache: Secondary | ICD-10-CM | POA: Diagnosis present

## 2011-10-04 DIAGNOSIS — E669 Obesity, unspecified: Secondary | ICD-10-CM | POA: Diagnosis present

## 2011-10-04 DIAGNOSIS — G4733 Obstructive sleep apnea (adult) (pediatric): Secondary | ICD-10-CM | POA: Diagnosis present

## 2011-10-04 DIAGNOSIS — G43809 Other migraine, not intractable, without status migrainosus: Principal | ICD-10-CM | POA: Diagnosis present

## 2011-10-04 DIAGNOSIS — R0789 Other chest pain: Secondary | ICD-10-CM | POA: Diagnosis present

## 2011-10-04 DIAGNOSIS — I10 Essential (primary) hypertension: Secondary | ICD-10-CM | POA: Diagnosis present

## 2011-10-04 DIAGNOSIS — I6529 Occlusion and stenosis of unspecified carotid artery: Secondary | ICD-10-CM | POA: Diagnosis present

## 2011-10-04 DIAGNOSIS — I209 Angina pectoris, unspecified: Secondary | ICD-10-CM | POA: Diagnosis not present

## 2011-10-04 DIAGNOSIS — Z853 Personal history of malignant neoplasm of breast: Secondary | ICD-10-CM | POA: Diagnosis not present

## 2011-10-04 DIAGNOSIS — Z79899 Other long term (current) drug therapy: Secondary | ICD-10-CM

## 2011-10-04 DIAGNOSIS — N289 Disorder of kidney and ureter, unspecified: Secondary | ICD-10-CM

## 2011-10-04 DIAGNOSIS — Z9989 Dependence on other enabling machines and devices: Secondary | ICD-10-CM | POA: Insufficient documentation

## 2011-10-04 DIAGNOSIS — G8929 Other chronic pain: Secondary | ICD-10-CM | POA: Diagnosis not present

## 2011-10-04 DIAGNOSIS — E118 Type 2 diabetes mellitus with unspecified complications: Secondary | ICD-10-CM | POA: Diagnosis present

## 2011-10-04 DIAGNOSIS — Z7982 Long term (current) use of aspirin: Secondary | ICD-10-CM

## 2011-10-04 DIAGNOSIS — J984 Other disorders of lung: Secondary | ICD-10-CM

## 2011-10-04 DIAGNOSIS — D649 Anemia, unspecified: Secondary | ICD-10-CM | POA: Diagnosis present

## 2011-10-04 DIAGNOSIS — E785 Hyperlipidemia, unspecified: Secondary | ICD-10-CM | POA: Diagnosis present

## 2011-10-04 DIAGNOSIS — E119 Type 2 diabetes mellitus without complications: Secondary | ICD-10-CM | POA: Diagnosis not present

## 2011-10-04 DIAGNOSIS — I700 Atherosclerosis of aorta: Secondary | ICD-10-CM

## 2011-10-04 DIAGNOSIS — M1990 Unspecified osteoarthritis, unspecified site: Secondary | ICD-10-CM | POA: Diagnosis present

## 2011-10-04 DIAGNOSIS — E0590 Thyrotoxicosis, unspecified without thyrotoxic crisis or storm: Secondary | ICD-10-CM | POA: Diagnosis present

## 2011-10-04 DIAGNOSIS — I2089 Other forms of angina pectoris: Secondary | ICD-10-CM | POA: Diagnosis present

## 2011-10-04 DIAGNOSIS — I517 Cardiomegaly: Secondary | ICD-10-CM | POA: Diagnosis not present

## 2011-10-04 DIAGNOSIS — I200 Unstable angina: Secondary | ICD-10-CM | POA: Diagnosis not present

## 2011-10-04 DIAGNOSIS — R519 Headache, unspecified: Secondary | ICD-10-CM | POA: Diagnosis present

## 2011-10-04 DIAGNOSIS — K219 Gastro-esophageal reflux disease without esophagitis: Secondary | ICD-10-CM | POA: Diagnosis not present

## 2011-10-04 DIAGNOSIS — C50919 Malignant neoplasm of unspecified site of unspecified female breast: Secondary | ICD-10-CM | POA: Diagnosis present

## 2011-10-04 DIAGNOSIS — I4510 Unspecified right bundle-branch block: Secondary | ICD-10-CM | POA: Diagnosis present

## 2011-10-04 DIAGNOSIS — R42 Dizziness and giddiness: Secondary | ICD-10-CM | POA: Diagnosis not present

## 2011-10-04 DIAGNOSIS — R079 Chest pain, unspecified: Secondary | ICD-10-CM | POA: Diagnosis present

## 2011-10-04 DIAGNOSIS — Z8249 Family history of ischemic heart disease and other diseases of the circulatory system: Secondary | ICD-10-CM

## 2011-10-04 DIAGNOSIS — E042 Nontoxic multinodular goiter: Secondary | ICD-10-CM | POA: Insufficient documentation

## 2011-10-04 DIAGNOSIS — E1169 Type 2 diabetes mellitus with other specified complication: Secondary | ICD-10-CM | POA: Diagnosis present

## 2011-10-04 HISTORY — DX: Headache, unspecified: R51.9

## 2011-10-04 HISTORY — DX: Type 2 diabetes mellitus with hyperglycemia: E11.65

## 2011-10-04 HISTORY — DX: Other chronic pain: G89.29

## 2011-10-04 HISTORY — DX: Headache: R51

## 2011-10-04 HISTORY — DX: Reserved for inherently not codable concepts without codable children: IMO0001

## 2011-10-04 HISTORY — DX: Peripheral vascular disease, unspecified: I73.9

## 2011-10-04 HISTORY — DX: Disorder of arteries and arterioles, unspecified: I77.9

## 2011-10-04 LAB — CBC WITH DIFFERENTIAL/PLATELET
Basophils Absolute: 0 10*3/uL (ref 0.0–0.1)
Basophils Relative: 0 % (ref 0–1)
Eosinophils Absolute: 0.1 10*3/uL (ref 0.0–0.7)
Eosinophils Relative: 0 % (ref 0–5)
HCT: 38.5 % (ref 36.0–46.0)
Hemoglobin: 13.1 g/dL (ref 12.0–15.0)
Lymphocytes Relative: 10 % — ABNORMAL LOW (ref 12–46)
Lymphs Abs: 1.2 10*3/uL (ref 0.7–4.0)
MCH: 29.9 pg (ref 26.0–34.0)
MCHC: 34 g/dL (ref 30.0–36.0)
MCV: 87.9 fL (ref 78.0–100.0)
Monocytes Absolute: 0.4 10*3/uL (ref 0.1–1.0)
Monocytes Relative: 4 % (ref 3–12)
Neutro Abs: 10.3 10*3/uL — ABNORMAL HIGH (ref 1.7–7.7)
Neutrophils Relative %: 86 % — ABNORMAL HIGH (ref 43–77)
Platelets: 236 10*3/uL (ref 150–400)
RBC: 4.38 MIL/uL (ref 3.87–5.11)
RDW: 14.4 % (ref 11.5–15.5)
WBC: 12 10*3/uL — ABNORMAL HIGH (ref 4.0–10.5)

## 2011-10-04 LAB — CARDIAC PANEL(CRET KIN+CKTOT+MB+TROPI)
CK, MB: 2.9 ng/mL (ref 0.3–4.0)
Relative Index: 2.7 — ABNORMAL HIGH (ref 0.0–2.5)
Total CK: 107 U/L (ref 7–177)
Troponin I: <0.3 ng/mL (ref <0.30)

## 2011-10-04 LAB — CBC
HCT: 36.5 % (ref 36.0–46.0)
Hemoglobin: 12.4 g/dL (ref 12.0–15.0)
MCH: 29.7 pg (ref 26.0–34.0)
MCHC: 34 g/dL (ref 30.0–36.0)
MCV: 87.3 fL (ref 78.0–100.0)
Platelets: 305 10*3/uL (ref 150–400)
RBC: 4.18 MIL/uL (ref 3.87–5.11)
RDW: 14.4 % (ref 11.5–15.5)
WBC: 8.4 10*3/uL (ref 4.0–10.5)

## 2011-10-04 LAB — BASIC METABOLIC PANEL
BUN: 28 mg/dL — ABNORMAL HIGH (ref 6–23)
CO2: 23 mEq/L (ref 19–32)
Calcium: 10.3 mg/dL (ref 8.4–10.5)
Chloride: 100 mEq/L (ref 96–112)
Creatinine, Ser: 0.89 mg/dL (ref 0.50–1.10)
GFR calc Af Amer: 75 mL/min — ABNORMAL LOW (ref >90)
GFR calc non Af Amer: 65 mL/min — ABNORMAL LOW (ref >90)
Glucose, Bld: 135 mg/dL — ABNORMAL HIGH (ref 70–99)
Potassium: 3.8 mEq/L (ref 3.5–5.1)
Sodium: 138 mEq/L (ref 135–145)

## 2011-10-04 LAB — CREATININE, SERUM
Creatinine, Ser: 0.8 mg/dL (ref 0.50–1.10)
GFR calc Af Amer: 85 mL/min — ABNORMAL LOW (ref >90)
GFR calc non Af Amer: 74 mL/min — ABNORMAL LOW (ref >90)

## 2011-10-04 LAB — APTT: aPTT: 22 seconds — ABNORMAL LOW (ref 24–37)

## 2011-10-04 LAB — PRO B NATRIURETIC PEPTIDE: Pro B Natriuretic peptide (BNP): 153.8 pg/mL — ABNORMAL HIGH (ref 0–125)

## 2011-10-04 LAB — GLUCOSE, CAPILLARY: Glucose-Capillary: 203 mg/dL — ABNORMAL HIGH (ref 70–99)

## 2011-10-04 LAB — TROPONIN I: Troponin I: <0.3 ng/mL (ref <0.30)

## 2011-10-04 LAB — SEDIMENTATION RATE: Sed Rate: 25 mm/hr — ABNORMAL HIGH (ref 0–22)

## 2011-10-04 LAB — PROTIME-INR
INR: 0.84 (ref 0.00–1.49)
Prothrombin Time: 11.7 seconds (ref 11.6–15.2)

## 2011-10-04 MED ORDER — LISINOPRIL 20 MG PO TABS
20.0000 mg | ORAL_TABLET | Freq: Every day | ORAL | Status: DC
  Administered 2011-10-05 – 2011-10-06 (×2): 20 mg via ORAL
  Filled 2011-10-04 (×3): qty 1

## 2011-10-04 MED ORDER — BISACODYL 5 MG PO TBEC: 5.0000 mg | DELAYED_RELEASE_TABLET | Freq: Every day | ORAL | Status: DC | PRN

## 2011-10-04 MED ORDER — SODIUM CHLORIDE 0.9 % IJ SOLN
3.0000 mL | Freq: Two times a day (BID) | INTRAMUSCULAR | Status: DC
  Administered 2011-10-04 – 2011-10-06 (×3): 3 mL via INTRAVENOUS

## 2011-10-04 MED ORDER — MORPHINE SULFATE 2 MG/ML IJ SOLN: 1.0000 mg | INTRAMUSCULAR | Status: DC | PRN

## 2011-10-04 MED ORDER — NITROGLYCERIN 2 % TD OINT
0.5000 [in_us] | TOPICAL_OINTMENT | Freq: Three times a day (TID) | TRANSDERMAL | Status: DC
  Filled 2011-10-04 (×22): qty 30

## 2011-10-04 MED ORDER — PANTOPRAZOLE SODIUM 40 MG PO TBEC
40.0000 mg | DELAYED_RELEASE_TABLET | Freq: Every day | ORAL | Status: DC
  Administered 2011-10-05: 40 mg via ORAL
  Filled 2011-10-04 (×2): qty 1

## 2011-10-04 MED ORDER — NITROGLYCERIN 2 % TD OINT
1.0000 [in_us] | TOPICAL_OINTMENT | Freq: Once | TRANSDERMAL | Status: DC
  Filled 2011-10-04: qty 1

## 2011-10-04 MED ORDER — AMLODIPINE BESYLATE 10 MG PO TABS
10.0000 mg | ORAL_TABLET | Freq: Every day | ORAL | Status: DC
  Administered 2011-10-05 – 2011-10-06 (×2): 10 mg via ORAL
  Filled 2011-10-04 (×3): qty 1

## 2011-10-04 MED ORDER — HEPARIN SODIUM (PORCINE) 5000 UNIT/ML IJ SOLN
5000.0000 [IU] | Freq: Three times a day (TID) | INTRAMUSCULAR | Status: DC
  Administered 2011-10-04 – 2011-10-05 (×2): 5000 [IU] via SUBCUTANEOUS
  Filled 2011-10-04 (×5): qty 1

## 2011-10-04 MED ORDER — ATORVASTATIN CALCIUM 40 MG PO TABS
40.0000 mg | ORAL_TABLET | Freq: Every day | ORAL | Status: DC
  Administered 2011-10-05 – 2011-10-06 (×2): 40 mg via ORAL
  Filled 2011-10-04 (×3): qty 1

## 2011-10-04 MED ORDER — POLYETHYLENE GLYCOL 3350 17 G PO PACK
17.0000 g | PACK | Freq: Every day | ORAL | Status: DC
  Administered 2011-10-05 – 2011-10-06 (×2): 17 g via ORAL
  Filled 2011-10-04 (×3): qty 1

## 2011-10-04 MED ORDER — METOCLOPRAMIDE HCL 5 MG/ML IJ SOLN
10.0000 mg | Freq: Once | INTRAMUSCULAR | Status: DC
  Filled 2011-10-04: qty 2

## 2011-10-04 MED ORDER — SODIUM CHLORIDE 0.9 % IV SOLN: 250.0000 mL | INTRAVENOUS | Status: DC | PRN

## 2011-10-04 MED ORDER — SODIUM CHLORIDE 0.9 % IJ SOLN
3.0000 mL | Freq: Two times a day (BID) | INTRAMUSCULAR | Status: DC
  Administered 2011-10-05 (×2): 3 mL via INTRAVENOUS

## 2011-10-04 MED ORDER — SODIUM CHLORIDE 0.9 % IV SOLN: Freq: Once | INTRAVENOUS | Status: DC

## 2011-10-04 MED ORDER — ASPIRIN EC 81 MG PO TBEC
81.0000 mg | DELAYED_RELEASE_TABLET | Freq: Every day | ORAL | Status: DC
  Administered 2011-10-05 – 2011-10-06 (×2): 81 mg via ORAL
  Filled 2011-10-04 (×3): qty 1

## 2011-10-04 MED ORDER — REGADENOSON 0.4 MG/5ML IV SOLN
0.4000 mg | Freq: Once | INTRAVENOUS | Status: AC
  Administered 2011-10-05: 0.4 mg via INTRAVENOUS
  Filled 2011-10-04: qty 5

## 2011-10-04 MED ORDER — LISINOPRIL-HYDROCHLOROTHIAZIDE 20-12.5 MG PO TABS: 1.0000 | ORAL_TABLET | Freq: Every day | ORAL | Status: DC

## 2011-10-04 MED ORDER — SODIUM CHLORIDE 0.9 % IJ SOLN: 3.0000 mL | INTRAMUSCULAR | Status: DC | PRN

## 2011-10-04 MED ORDER — INSULIN ASPART 100 UNIT/ML ~~LOC~~ SOLN
0.0000 [IU] | Freq: Three times a day (TID) | SUBCUTANEOUS | Status: DC
  Administered 2011-10-05: 5 [IU] via SUBCUTANEOUS

## 2011-10-04 MED ORDER — DIPHENHYDRAMINE HCL 50 MG/ML IJ SOLN
25.0000 mg | Freq: Once | INTRAMUSCULAR | Status: DC
  Filled 2011-10-04: qty 1

## 2011-10-04 MED ORDER — ONDANSETRON HCL 4 MG/2ML IJ SOLN
INTRAMUSCULAR | Status: AC
  Filled 2011-10-04: qty 2

## 2011-10-04 MED ORDER — EZETIMIBE 10 MG PO TABS
10.0000 mg | ORAL_TABLET | Freq: Every day | ORAL | Status: DC
  Administered 2011-10-05 – 2011-10-06 (×2): 10 mg via ORAL
  Filled 2011-10-04 (×3): qty 1

## 2011-10-04 MED ORDER — HYDROCHLOROTHIAZIDE 12.5 MG PO CAPS
12.5000 mg | ORAL_CAPSULE | Freq: Every day | ORAL | Status: DC
  Administered 2011-10-05 – 2011-10-06 (×2): 12.5 mg via ORAL
  Filled 2011-10-04 (×3): qty 1

## 2011-10-04 NOTE — H&P (Signed)
History and Physical Examination  Date: 10/04/2011  Patient name: Karen West Medical record number: [id number] Date of birth: 1940-06-05 Age: 71 y.o. Gender: female PCP: Horatio Pel, MD  Chief Complaint:  Chief Complaint  Patient presents with  . Chest Pain  . Headache     History of Present Illness: Karen West is an 71 y.o. female with hypertension, hyperlipidemia, diabetes mellitus and carotid vascular disease presented to the emergency department today complaining of sudden onset of severe headache pain.  She also complained of chest pain that started around 11 this morning.  She was brought to the emergency department by EMS.  They gave her nitroglycerin and the chest pain resolved.  The patient suffers from chronic headaches and occasionally reports severe headaches.  She reports that she was discharged from [facility] 2 days ago from a hospitalization for chest pain.  The patient says she has never experienced chest pain until 4 days ago.  She was sitting down eating breakfast and drinking coffee and experienced anterior left-sided chest wall pain with no radiation.  She was admitted into the hospital and ruled out for myocardial infarction.  She was scheduled to have an outpatient stress test in 4 days.  The patient reports that her mother died of myocardial infarction at the age of 75.  She also has a family history significant for multiple strokes and vascular disease.  The patient says that she has acid reflux disease obstructive sleep apnea and controlled type 2 diabetes mellitus.  Because she has reported recurrence of chest pain there is concern for unstable angina and coronary artery disease.  The patient reports that she has a 60-70% blockage in one of her carotid arteries that was reported 3 months ago.  Hospitalization was requested for cardiology consultation, rule out myocardial infarction and monitoring.    Past Medical History Past Medical History    Diagnosis Date  . Hyperlipidemia   . Hypertension   . OSA on CPAP   . Breast cancer     s/p left mastectomy  . Chronic anemia   . GERD (gastroesophageal reflux disease)   . Obesity   . Osteoarthritis   . Right bundle branch block   . Atherosclerosis of aorta   . Multinodular goiter   . Type II or unspecified type diabetes mellitus without mention of complication, uncontrolled   . Chronic headaches         Known carotid artery disease  Past Surgical History Past Surgical History  Procedure Date  . Mastectomy     left  . Tubal ligation     Home Meds: Prior to Admission medications   Medication Sig Start Date End Date Taking? Authorizing Provider  amLODipine (NORVASC) 10 MG tablet Take 10 mg by mouth daily.   Yes Historical Provider, MD  aspirin 81 MG tablet Take 81 mg by mouth daily.     Yes Historical Provider, MD  Calcium Carbonate (CALCIUM 600 PO) Take 1 tablet by mouth 2 (two) times daily.   Yes Historical Provider, MD  ezetimibe (ZETIA) 10 MG tablet Take 10 mg by mouth daily.     Yes Historical Provider, MD  glipiZIDE (GLUCOTROL) 10 MG 24 hr tablet Take 10 mg by mouth 2 (two) times daily.     Yes Historical Provider, MD  lisinopril-hydrochlorothiazide (PRINZIDE,ZESTORETIC) 20-12.5 MG per tablet Take 1 tablet by mouth daily.     Yes Historical Provider, MD  naproxen sodium (ANAPROX) 220 MG tablet Take 440 mg by mouth  2 (two) times daily as needed. For pain   Yes Historical Provider, MD  nitroGLYCERIN (NITROSTAT) 0.4 MG SL tablet Place 0.4 mg under the tongue every 5 (five) minutes as needed. For chest pain   Yes Historical Provider, MD  Omega-3 Fatty Acids (FISH OIL) 1200 MG CAPS Take 1 capsule by mouth daily.   Yes Historical Provider, MD  omeprazole (PRILOSEC) 20 MG capsule Take 20 mg by mouth daily.     Yes Historical Provider, MD  rosuvastatin (CRESTOR) 20 MG tablet Take 20 mg by mouth daily.   Yes Historical Provider, MD  Saxagliptin-Metformin (KOMBIGLYZE XR) 2.07-998  MG TB24 Take 1 tablet by mouth 2 (two) times daily.   Yes Historical Provider, MD  aspirin 81 MG chewable tablet Chew 324 mg by mouth once.    Historical Provider, MD    Allergies: Tramadol  Social History:  History   Social History  . Marital Status: Widowed    Spouse Name: N/A    Number of Children: N/A  . Years of Education: N/A   Occupational History  . retired     Optometrist   Social History Main Topics  . Smoking status: Never Smoker   . Smokeless tobacco: Not on file  . Alcohol Use: No  . Drug Use: Not on file  . Sexually Active: Not on file   Other Topics Concern  . Not on file   Social History Narrative  . No narrative on file   Family History:  Family History  Problem Relation Age of Onset  . Diabetes Sister   . Diabetes Brother     x3  . Hypertension Brother     x3  . Heart disease Brother     x3  . Stroke Brother     x3  . Heart attack Mother 72    Mother Died of MI age 74  . Stroke Sister     Review of Systems: Pertinent items are noted in HPI. All other systems reviewed and reported as negative.   Physical Exam: Blood pressure 111/84, pulse 74, temperature 97.6 F (36.4 C), temperature source Oral, resp. rate 16, height 5\' 2"  (1.575 m), weight 81.647 kg (180 lb), SpO2 100.00%. General appearance: alert, cooperative, appears stated age and no distress Head: Normocephalic, without obvious abnormality, atraumatic Eyes: conjunctivae/corneas clear. PERRL, EOM's intact. Fundi benign. Nose: Nares normal. Septum midline. Mucosa normal. No drainage or sinus tenderness., no discharge Throat: lips, mucosa, and tongue normal; teeth and gums normal Neck: no adenopathy, no carotid bruit, no JVD, supple, symmetrical, trachea midline and thyroid not enlarged, symmetric, no tenderness/mass/nodules Lungs: clear to auscultation bilaterally and normal percussion bilaterally Heart: regular rate and rhythm, S1, S2 normal, no murmur, click, rub or  gallop Abdomen: soft, non-tender; bowel sounds normal; no masses,  no organomegaly Extremities: extremities normal, atraumatic, no cyanosis or edema Pulses: 2+ and symmetric Skin: Skin color, texture, turgor normal. No rashes or lesions Neurologic: Alert and oriented X 3, normal strength and tone. Normal symmetric reflexes. Normal coordination and gait  Lab  And Imaging results:  Results for orders placed during the hospital encounter of 10/04/11 (from the past 24 hour(s))  CBC WITH DIFFERENTIAL     Status: Abnormal   Collection Time   10/04/11  2:33 PM      Component Value Range   WBC 12.0 (*) 4.0 - 10.5 K/uL   RBC 4.38  3.87 - 5.11 MIL/uL   Hemoglobin 13.1  12.0 - 15.0 g/dL   HCT  38.5  36.0 - 46.0 %   MCV 87.9  78.0 - 100.0 fL   MCH 29.9  26.0 - 34.0 pg   MCHC 34.0  30.0 - 36.0 g/dL   RDW 14.4  11.5 - 15.5 %   Platelets 236  150 - 400 K/uL   Neutrophils Relative 86 (*) 43 - 77 %   Neutro Abs 10.3 (*) 1.7 - 7.7 K/uL   Lymphocytes Relative 10 (*) 12 - 46 %   Lymphs Abs 1.2  0.7 - 4.0 K/uL   Monocytes Relative 4  3 - 12 %   Monocytes Absolute 0.4  0.1 - 1.0 K/uL   Eosinophils Relative 0  0 - 5 %   Eosinophils Absolute 0.1  0.0 - 0.7 K/uL   Basophils Relative 0  0 - 1 %   Basophils Absolute 0.0  0.0 - 0.1 K/uL  BASIC METABOLIC PANEL     Status: Abnormal   Collection Time   10/04/11  2:33 PM      Component Value Range   Sodium 138  135 - 145 mEq/L   Potassium 3.8  3.5 - 5.1 mEq/L   Chloride 100  96 - 112 mEq/L   CO2 23  19 - 32 mEq/L   Glucose, Bld 135 (*) 70 - 99 mg/dL   BUN 28 (*) 6 - 23 mg/dL   Creatinine, Ser 0.89  0.50 - 1.10 mg/dL   Calcium 10.3  8.4 - 10.5 mg/dL   GFR calc non Af Amer 65 (*) >90 mL/min   GFR calc Af Amer 75 (*) >90 mL/min  PROTIME-INR     Status: Normal   Collection Time   10/04/11  2:33 PM      Component Value Range   Prothrombin Time 11.7  11.6 - 15.2 seconds   INR 0.84  0.00 - 1.49  APTT     Status: Abnormal   Collection Time   10/04/11  2:33  PM      Component Value Range   aPTT 22 (*) 24 - 37 seconds  SEDIMENTATION RATE     Status: Abnormal   Collection Time   10/04/11  2:33 PM      Component Value Range   Sed Rate 25 (*) 0 - 22 mm/hr  TROPONIN I     Status: Normal   Collection Time   10/04/11  2:34 PM      Component Value Range   Troponin I <0.30  <0.30 ng/mL     Impression  Principal Problem:  *Chest pain at rest Active Problems:  DIABETES, TYPE 2  HYPERLIPIDEMIA  OBESITY  OBSTRUCTIVE SLEEP APNEA  HYPERTENSION  G E R D  OSTEOARTHRITIS  Hyperlipidemia  Breast cancer  Chronic anemia  Right bundle branch block  Type II or unspecified type diabetes mellitus without mention of complication, uncontrolled  Angina at rest  Headache  FH: MI (myocardial infarction)  Chronic headaches  Known carotid artery disease  Plan  The patient is going to be admitted to telemetry for further monitoring.  I have consult and [place] cardiology for evaluation and management recommendations.  Will cycle cardiac enzymes to rule out myocardial ischemia.  Will check fasting lipid panel.  Will check carotid Dopplers to clarify carotid pathology and check echocardiogram.  We'll provide nitroglycerin, oxygen, morphine and aspirin,  continue CPAP nightly, monitor blood glucose and provide supplemental insulin to control blood glucose, recheck CBC, resume home medications for lipids and blood pressure, provide Protonix for GERD and  stress ulcer prophylaxis, provide DVT prophylaxis, treat constipation, please see orders and follow hospital course.  Pt's headache is currently resolved.  Chest pain is currently resolved.    Milus Height MD [place], [place] 10/04/2011, 4:48 PM

## 2011-10-04 NOTE — ED Notes (Signed)
Patient was brought in by ambulance with complaint of chest pain, headache with vomiting and diaphoresis. Pt was given 324 mg of ASA and NTG 1 tab SL with relief of chest pain. Pt is A/A/Ox4, skin is warm and dry, respiration is even and unlabored. Pt continues to complain of headache.

## 2011-10-04 NOTE — ED Notes (Signed)
2 nurses looked for IV site, none found, will page IV team.

## 2011-10-04 NOTE — ED Provider Notes (Signed)
History     CSN: [id number]  Arrival date & time 10/04/11  1342   First MD Initiated Contact with Patient 10/04/11 1345      Chief Complaint  Patient presents with  . Chest Pain  . Headache    (Consider location/radiation/quality/duration/timing/severity/associated sxs/prior treatment) Patient is a 71 y.o. female presenting with chest pain and headaches. The history is provided by the patient.  Chest Pain    Headache   She had onset this morning of a generalized headache which she states is severe and she rates the pain at 8/10. She describes a throbbing, pounding sensation. There is no associated photophobia or phonophobia. Nothing makes it better and nothing makes it worse. There has been associated nausea and vomiting but no visual disturbance. She also developed a mild chest discomfort which has difficulty in characterizing. She stated that pain was 4/10 at its worst and is completely gone now. She did have associated nausea, vomiting, diaphoresis. She was treated by EMS with aspirin and nitroglycerin and her chest pain is gone away completely. Of note, she had been hospitalized at [facility] 2 days ago for chest pain and is scheduled for stress testing in several days. Nothing seems to make any of her pains worse, and the only thing that made anything better was nitroglycerin taken away her chest pain.  Past Medical History  Diagnosis Date  . Hyperlipidemia   . Hypertension   . OSA on CPAP   . Breast cancer     s/p left mastectomy  . Chronic anemia   . GERD (gastroesophageal reflux disease)   . Obesity   . Osteoarthritis   . Right bundle branch block   . Atherosclerosis of aorta   . Multinodular goiter   . Diabetes mellitus     Past Surgical History  Procedure Date  . Mastectomy     left  . Tubal ligation     Family History  Problem Relation Age of Onset  . Diabetes Sister   . Diabetes Brother     x3  . Hypertension Brother     x3  . Heart disease  Brother     x3  . Stroke Brother     x3    History  Substance Use Topics  . Smoking status: Never Smoker   . Smokeless tobacco: Not on file  . Alcohol Use: No    OB History    Grav Para Term Preterm Abortions TAB SAB Ect Mult Living                  Review of Systems  Cardiovascular: Positive for chest pain.  Neurological: Positive for headaches.  All other systems reviewed and are negative.    Allergies  Tramadol  Home Medications   Current Outpatient Rx  Name Route Sig Dispense Refill  . AMLODIPINE BESYLATE 10 MG PO TABS Oral Take 10 mg by mouth daily.    . ASPIRIN 81 MG PO TABS Oral Take 81 mg by mouth daily.      Marland Kitchen CALCIUM 600 PO Oral Take 1 tablet by mouth 2 (two) times daily.    Marland Kitchen EZETIMIBE 10 MG PO TABS Oral Take 10 mg by mouth daily.      Marland Kitchen GLIPIZIDE ER 10 MG PO TB24 Oral Take 10 mg by mouth 2 (two) times daily.      Marland Kitchen LISINOPRIL-HYDROCHLOROTHIAZIDE 20-12.5 MG PO TABS Oral Take 1 tablet by mouth daily.      Marland Kitchen NAPROXEN SODIUM 220  MG PO TABS Oral Take 440 mg by mouth 2 (two) times daily as needed. For pain    . NITROGLYCERIN 0.4 MG SL SUBL Sublingual Place 0.4 mg under the tongue every 5 (five) minutes as needed. For chest pain    . FISH OIL 1200 MG PO CAPS Oral Take 1 capsule by mouth daily.    Marland Kitchen OMEPRAZOLE 20 MG PO CPDR Oral Take 20 mg by mouth daily.      Marland Kitchen ROSUVASTATIN CALCIUM 20 MG PO TABS Oral Take 20 mg by mouth daily.    Marland Kitchen SAXAGLIPTIN-METFORMIN ER 2.07-998 MG PO TB24 Oral Take 1 tablet by mouth 2 (two) times daily.    . ASPIRIN 81 MG PO CHEW Oral Chew 324 mg by mouth once.      BP 137/62  Pulse 74  Temp 97.9 F (36.6 C) (Oral)  Resp 15  Ht 5\' 2"  (1.575 m)  Wt 180 lb (81.647 kg)  BMI 32.92 kg/m2  SpO2 98%  Physical Exam  Nursing note and vitals reviewed.  71 year old female is resting comfortably in no acute distress. Vital signs are normal. Oxygen saturation is 98% which is normal. Head is normocephalic and atraumatic. PERRLA, EOMI. Fundi show  no hemorrhage, exudate, or papilledema. There is moderate tenderness of the temporalis and frontalis muscles. Neck is nontender and supple without bruit. The lungs are clear without rales, wheezes, rhonchi. Heart has regular rate and rhythm with a 2/6 systolic ejection murmur which radiates to the neck but not into the carotids. Abdomen is soft, flat, nontender without masses or hepatosplenomegaly. Peristalsis is normal active. Extremities have no cyanosis or edema, full range of motion is present. Skin is warm and dry without rash. Neurologic: Mental status is normal, cranial nerves are intact, there are no motor or sensory deficits.  ED Course  Procedures (including critical care time)  Results for orders placed during the hospital encounter of 10/04/11  CBC WITH DIFFERENTIAL      Component Value Range   WBC 12.0 (*) 4.0 - 10.5 K/uL   RBC 4.38  3.87 - 5.11 MIL/uL   Hemoglobin 13.1  12.0 - 15.0 g/dL   HCT 38.5  36.0 - 46.0 %   MCV 87.9  78.0 - 100.0 fL   MCH 29.9  26.0 - 34.0 pg   MCHC 34.0  30.0 - 36.0 g/dL   RDW 14.4  11.5 - 15.5 %   Platelets 236  150 - 400 K/uL   Neutrophils Relative 86 (*) 43 - 77 %   Neutro Abs 10.3 (*) 1.7 - 7.7 K/uL   Lymphocytes Relative 10 (*) 12 - 46 %   Lymphs Abs 1.2  0.7 - 4.0 K/uL   Monocytes Relative 4  3 - 12 %   Monocytes Absolute 0.4  0.1 - 1.0 K/uL   Eosinophils Relative 0  0 - 5 %   Eosinophils Absolute 0.1  0.0 - 0.7 K/uL   Basophils Relative 0  0 - 1 %   Basophils Absolute 0.0  0.0 - 0.1 K/uL  BASIC METABOLIC PANEL      Component Value Range   Sodium 138  135 - 145 mEq/L   Potassium 3.8  3.5 - 5.1 mEq/L   Chloride 100  96 - 112 mEq/L   CO2 23  19 - 32 mEq/L   Glucose, Bld 135 (*) 70 - 99 mg/dL   BUN 28 (*) 6 - 23 mg/dL   Creatinine, Ser 0.89  0.50 - 1.10 mg/dL  Calcium 10.3  8.4 - 10.5 mg/dL   GFR calc non Af Amer 65 (*) >90 mL/min   GFR calc Af Amer 75 (*) >90 mL/min  TROPONIN I      Component Value Range   Troponin I <0.30  <0.30  ng/mL  PROTIME-INR      Component Value Range   Prothrombin Time 11.7  11.6 - 15.2 seconds   INR 0.84  0.00 - 1.49  APTT      Component Value Range   aPTT 22 (*) 24 - 37 seconds  SEDIMENTATION RATE      Component Value Range   Sed Rate 25 (*) 0 - 22 mm/hr   Dg Chest Portable 1 View  10/04/2011  *RADIOLOGY REPORT*  Clinical Data: Chest pain, headache  PORTABLE CHEST - 1 VIEW  Comparison: Chest x-ray of 03/23/2009 and CT chest of 09/09/2010  Findings: The lungs are poorly aerated with basilar volume loss. Cardiomegaly is stable.  There are degenerative changes throughout the thoracic spine.  Calcification in the left lower neck probably is within the thyroid gland.  IMPRESSION: Poor inspiration chest x-ray with basilar volume loss.  No definite active process.  Stable cardiomegaly.  Original Report Authenticated By: Joretta Bachelor, M.D.      Date: 10/04/2011  Rate: 74  Rhythm: normal sinus rhythm  QRS Axis: normal  Intervals: normal  ST/T Wave abnormalities: normal  Conduction Disutrbances:right bundle branch block and left anterior fascicular block  Narrative Interpretation: Left ventricular hypertrophy, right bundle branch block, left anterior fascicular block. When compared with ECG of 11/14/2010, no significant changes are seen.  Old EKG Reviewed: unchanged    1. Chest pain   2. Headache       MDM  Chest pain worrisome for unstable angina given that pain occurred at rest and she had similar pain 2 days ago. Headache which seems to be most likely muscle contraction headache. However, given her age, sedimentation rate will be obtained to evaluate for possible temporal arteritis. She will need to be admitted for evaluation of possible coronary artery disease. Of note, she had occlusions in her carotid arteries of 50-70% on a scan done 3 months ago. This puts her at very high risk for coronary artery disease and would make her inappropriate for CDU placement. She will be given  headache cocktail and placed on topical nitroglycerin glycerin.   She's had no further chest pain while in the emergency department. There has been difficulty obtaining IV access, so she has not received her headache cocktail. However, her headache has subsided and is down to 1/10. Case is discussed with Dr. Wynetta Emery of [facility] who agrees to admit the patient.     Delora Fuel, MD [phone]

## 2011-10-04 NOTE — ED Notes (Signed)
Family at bedside. (Sisters)

## 2011-10-04 NOTE — Consult Note (Signed)
CARDIOLOGY CONSULT NOTE  Patient ID: Karen West, MRN: [id number], DOB/AGE: [birth year] 71 y.o. Admit date: 10/04/2011 Date of Consult: 10/04/2011  Primary Physician: Horatio Pel, MD Primary Cardiologist: None  Chief Complaint: chest pain and headache Reason for Consultation: chest pain  HPI: 71 y.o. female w/ PMHx significant for RBBB, HTN, HLD, DMII, Carotid dz, Breast CA (s/p L mastectomy), GERD, OSA, Chronic anemia, chronic headaches, and multinodular goiter who presented to [facility] on 10/04/2011 with complaints of chest pain and headache.  No prior cardiac history. Has family history of early CAD. Carotid doppler Q000111Q w/ 0000000 LICA stenosis and Q000111Q RICA stenosis. Was discharged from [facility] 10/02/11 after being evaluated for chest pain and ruled out for MI. She reports last Sunday (10/01/11) while sitting in church she had sudden onset substernal chest tightness, 10/10, with associated diaphoresis, nausea and vomiting. Denies radiation or sob. She then developed a severe headache. EMS was called and transported her to [facility] where her chest pain resolved with SL NTG. It lasted about 37mins. Her headache also resolved although she is not sure if she was given any pain medication to relieve it. She was observed overnight and sent home on Monday with plans for a stress test on 10/09/11. She felt well on Monday and Tuesday. Today after eating breakfast she suddenly felt dizzy while sitting then developed nausea, vomiting, and a severe headache. She also noted similar chest pain, but states it was only a 3/10. Notes the emesis was dark in color, but unsure if it was blood. EMS was called and transported her to [facility]. She received ASA and SL NTG by EMS with resolution of chest pain. Her headache resolved in the ED with resolution of her vomiting. She denies recent illness, fever, chills, edema, orthopnea, sob, PND, neuro deficits, visual changes,  abd pain, change in bladder or bowel habits, melena, hematochezia, prolonged immobility/extended travel. She has lost ~19lbs in 16mos intentionally by improving her diet. She is able to perform ADLs, walk to her mailbox, and take stairs without sob or chest pain.  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 CXR without acute findings. Labs were significant for normal troponin, WBC 12.0, ESR 25, otherwise unremarkable CBC/BMET. Cardiology was asked to assist in evaluation of chest pain.  Past Medical History  Diagnosis Date  . Hyperlipidemia   . Hypertension   . OSA on CPAP   . Breast cancer     s/p left mastectomy  . Chronic anemia   . GERD (gastroesophageal reflux disease)   . Obesity   . Osteoarthritis   . Right bundle branch block   . Atherosclerosis of aorta   . Multinodular goiter   . Type II or unspecified type diabetes mellitus without mention of complication, uncontrolled   . Chronic headaches   . Carotid artery disease     Q000111Q w/ 0000000 LICA stenosis and Q000111Q RICA stenosis      Surgical History:  Past Surgical History  Procedure Date  . Mastectomy     left  . Tubal ligation      Home Meds: Medication Sig  amLODipine (NORVASC) 10 MG tablet Take 10 mg by mouth daily.  aspirin 81 MG tablet Take 81 mg by mouth daily.    Calcium Carbonate (CALCIUM 600 PO) Take 1 tablet by mouth 2 (two) times daily.  ezetimibe (ZETIA) 10 MG tablet Take 10 mg by mouth daily.    glipiZIDE (  GLUCOTROL) 10 MG 24 hr tablet Take 10 mg by mouth 2 (two) times daily.    lisinopril-hydrochlorothiazide (PRINZIDE,ZESTORETIC) 20-12.5 MG per tablet Take 1 tablet by mouth daily.    naproxen sodium (ANAPROX) 220 MG tablet Take 440 mg by mouth 2 (two) times daily as needed. For pain  nitroGLYCERIN (NITROSTAT) 0.4 MG SL tablet Place 0.4 mg under the tongue every 5 (five) minutes as needed. For chest pain  Omega-3 Fatty Acids (FISH OIL) 1200 MG CAPS Take 1 capsule by  mouth daily.  omeprazole (PRILOSEC) 20 MG capsule Take 20 mg by mouth daily.    rosuvastatin (CRESTOR) 20 MG tablet Take 20 mg by mouth daily.  Saxagliptin-Metformin (KOMBIGLYZE XR) 2.07-998 MG TB24 Take 1 tablet by mouth 2 (two) times daily.    Inpatient Medications:     . amLODipine  10 mg Oral Daily  . aspirin  81 mg Oral Daily  . atorvastatin  40 mg Oral q1800  . ezetimibe  10 mg Oral Daily  . heparin  5,000 Units Subcutaneous Q8H  . insulin aspart  0-15 Units Subcutaneous TID WC  . lisinopril-hydrochlorothiazide  1 tablet Oral Daily  . nitroGLYCERIN  0.5 inch Topical Q8H  . ondansetron      . pantoprazole  40 mg Oral Q1200  . polyethylene glycol  17 g Oral Daily  . sodium chloride  3 mL Intravenous Q12H  . sodium chloride  3 mL Intravenous Q12H      Allergies:  Allergies  Allergen Reactions  . Tramadol Other (See Comments)    Unknown ("put me in the hospital")    History   Social History  . Marital Status: Widowed    Spouse Name: N/A    Number of Children: N/A  . Years of Education: N/A   Occupational History  . retired     Optometrist   Social History Main Topics  . Smoking status: Never Smoker   . Smokeless tobacco: Not on file  . Alcohol Use: No  . Drug Use: Not on file  . Sexually Active: Not on file   Other Topics Concern  . Not on file   Social History Narrative  . No narrative on file     Family History  Problem Relation Age of Onset  . Diabetes Sister   . Diabetes Brother     x3  . Hypertension Brother     x3  . Heart disease Brother     x3  . Stroke Brother     x3  . Heart attack Mother 68    Mother Died of MI age 76  . Stroke Sister      Review of Systems: General: (+) Intentional weight loss; negative for chills, fever, night sweats  Cardiovascular: As per HPI Dermatological: negative for rash Respiratory: negative for cough or wheezing Urologic: negative for hematuria Abdominal: (+) nausea, vomiting, ? Hematemesis;  negative fordiarrhea, bright red blood per rectum, melena Neurologic: (+) headache; negative for visual changes, syncope, or dizziness All other systems reviewed and are otherwise negative except as noted above.  Labs:  [facility] 10/04/11 1434  CKTOTAL --  CKMB --  TROPONINI <0.30   Lab Results  Component Value Date   WBC 12.0* 10/04/2011   HGB 13.1 10/04/2011   HCT 38.5 10/04/2011   MCV 87.9 10/04/2011   PLT 236 10/04/2011     Lab 10/04/11 1433  NA 138  K 3.8  CL 100  CO2 23  BUN 28*  CREATININE 0.89  CALCIUM 10.3  PROT --  BILITOT --  ALKPHOS --  ALT --  AST --  GLUCOSE 135*     10/04/2011 14:33  Sed Rate 25 (H)    10/04/2011 14:33  Prothrombin Time 11.7  INR 0.84   Radiology/Studies:   10/04/2011 - Chest Portable 1 View Findings: The lungs are poorly aerated with basilar volume loss. Cardiomegaly is stable.  There are degenerative changes throughout the thoracic spine.  Calcification in the left lower neck probably is within the thyroid gland.  IMPRESSION: Poor inspiration chest x-ray with basilar volume loss.  No definite active process.  Stable cardiomegaly.   EKG: 10/02/11 @ 1350- Sinus rhythm 74bpm, RBBB, LAFB, no acute ST/T changes, unchanged from prior EKG  Physical Exam: Blood pressure 111/84, pulse 74, temperature 97.6 F (36.4 C), temperature source Oral, resp. rate 16, height 5\' 2"  (1.575 m), weight 180 lb (81.647 kg), SpO2 100.00%. General: Pleasant, overweight elderly black female in no acute distress. Head: Normocephalic, atraumatic, sclera non-icteric, no xanthomas, nares are without discharge.  Neck: Supple. Negative for carotid bruits. JVD difficult to asses due to habitus, but not overt. Lungs: Distant breath sounds. No wheezes, rales, or rhonchi. Breathing is unlabored. Heart: RRR with S1 S2. 2/6 systolic apical murmur. No rubs or gallops appreciated. Abdomen: Soft, non-tender, non-distended with normoactive bowel sounds. No rebound/guarding. No  obvious abdominal masses. Msk:  Strength and tone appear normal for age. Extremities: No clubbing or cyanosis. No edema.  Distal pedal pulses are intact and equal bilaterally. Neuro: Alert and oriented X 3. Moves all extremities spontaneously. Psych:  Responds to questions appropriately with a normal affect.   Assessment and Plan:  71 y.o. female w/ PMHx significant for RBBB, HTN, HLD, DMII, Carotid dz, Breast CA (s/p L mastectomy), GERD, OSA, Chronic anemia, chronic headaches, and multinodular goiter who presented to [facility] on 10/04/2011 with complaints of chest pain and headache.  1. Chest pain: Patient has no prior cardiac history. Was recently evaluated at [facility] for chest pain and ruled out. Now presents with chest pain, vomiting, and severe headache. Initial troponin normal, EKG without ischemic changes. CXR unremarkable. Will request records from recent hospitalization. She does have multiple cardiac risk factors, known vascular disease, and family history of early CAD. Cont to cycle cardiac enzymes. Plan for lexiscan myoview tomorrow unless she rules in then plan for cardiac cath. No heparin unless she rules in. Echo pending. Also has h/o multinodular goiter with normal uptake scan 08/2010. Check TSH. ? GI etiology with n/v and ? of hematemesis. Guaiac stool. Consider GI consult if no evidence of cardiac etiology.  2. HTN: Cont antihypertensives   3. HLD: Cont statin and zetia  Signed, HOPE, JESSICA PA-C 10/04/2011, 5:33 PM  Attending Note:   1. Chest pressure / angina:  The patient was seen and examined.  Agree with assessment and plan as noted above.  Pt has had 2 episodes of concerning chest pressure / tightness.  She episodes occurred in the presence of a severe HA and may have been related to increase BP. Resolved with SL NTG.    She was hospitalized earlier this week at [facility] and ruled out for MI.  On exam , she has a murmur c/w MR. No evidence  of CHF.  She has decreased breath sounds in the bases bilaterally.  Would check cardiac enzymes.  If negative, would proceed with [facility] tomorrow.  If the enzymes are positive, would arrange for cardiac cath.  Other causes of CP should be considered - hiatal hernia ( although she had a reportedly normal ECG ~8 weeks ago)  , GERD, esophageal spasm.  2. Head ache:  She may need further evaluation of this.   Thayer Headings, Brooke Bonito., MD, [facility] 10/04/2011, 6:45 PM

## 2011-10-05 ENCOUNTER — Inpatient Hospital Stay (HOSPITAL_COMMUNITY): Payer: Medicare Other

## 2011-10-05 DIAGNOSIS — R079 Chest pain, unspecified: Secondary | ICD-10-CM

## 2011-10-05 DIAGNOSIS — E042 Nontoxic multinodular goiter: Secondary | ICD-10-CM

## 2011-10-05 DIAGNOSIS — E0590 Thyrotoxicosis, unspecified without thyrotoxic crisis or storm: Secondary | ICD-10-CM | POA: Diagnosis present

## 2011-10-05 DIAGNOSIS — I700 Atherosclerosis of aorta: Secondary | ICD-10-CM

## 2011-10-05 DIAGNOSIS — I369 Nonrheumatic tricuspid valve disorder, unspecified: Secondary | ICD-10-CM

## 2011-10-05 DIAGNOSIS — I6529 Occlusion and stenosis of unspecified carotid artery: Secondary | ICD-10-CM

## 2011-10-05 DIAGNOSIS — R51 Headache: Secondary | ICD-10-CM

## 2011-10-05 LAB — LIPID PANEL
Cholesterol: 141 mg/dL (ref 0–200)
HDL: 49 mg/dL (ref >39)
LDL Cholesterol: 65 mg/dL (ref 0–99)
Total CHOL/HDL Ratio: 2.9 RATIO
Triglycerides: 137 mg/dL (ref <150)
VLDL: 27 mg/dL (ref 0–40)

## 2011-10-05 LAB — BASIC METABOLIC PANEL
BUN: 22 mg/dL (ref 6–23)
CO2: 24 mEq/L (ref 19–32)
Calcium: 10.1 mg/dL (ref 8.4–10.5)
Chloride: 103 mEq/L (ref 96–112)
Creatinine, Ser: 0.91 mg/dL (ref 0.50–1.10)
GFR calc Af Amer: 73 mL/min — ABNORMAL LOW (ref >90)
GFR calc non Af Amer: 63 mL/min — ABNORMAL LOW (ref >90)
Glucose, Bld: 61 mg/dL — ABNORMAL LOW (ref 70–99)
Potassium: 3.9 mEq/L (ref 3.5–5.1)
Sodium: 142 mEq/L (ref 135–145)

## 2011-10-05 LAB — TSH: TSH: 0.218 u[IU]/mL — ABNORMAL LOW (ref 0.350–4.500)

## 2011-10-05 LAB — GLUCOSE, CAPILLARY
Glucose-Capillary: 58 mg/dL — ABNORMAL LOW (ref 70–99)
Glucose-Capillary: 138 mg/dL — ABNORMAL HIGH (ref 70–99)
Glucose-Capillary: 182 mg/dL — ABNORMAL HIGH (ref 70–99)
Glucose-Capillary: 239 mg/dL — ABNORMAL HIGH (ref 70–99)
Glucose-Capillary: 141 mg/dL — ABNORMAL HIGH (ref 70–99)

## 2011-10-05 LAB — CBC
HCT: 34.6 % — ABNORMAL LOW (ref 36.0–46.0)
Hemoglobin: 11.6 g/dL — ABNORMAL LOW (ref 12.0–15.0)
MCH: 29.4 pg (ref 26.0–34.0)
MCHC: 33.5 g/dL (ref 30.0–36.0)
MCV: 87.6 fL (ref 78.0–100.0)
Platelets: 240 10*3/uL (ref 150–400)
RBC: 3.95 MIL/uL (ref 3.87–5.11)
RDW: 14.4 % (ref 11.5–15.5)
WBC: 5.6 10*3/uL (ref 4.0–10.5)

## 2011-10-05 LAB — CARDIAC PANEL(CRET KIN+CKTOT+MB+TROPI)
CK, MB: 2.9 ng/mL (ref 0.3–4.0)
CK, MB: 2.6 ng/mL (ref 0.3–4.0)
Relative Index: INVALID (ref 0.0–2.5)
Relative Index: INVALID (ref 0.0–2.5)
Total CK: 93 U/L (ref 7–177)
Total CK: 96 U/L (ref 7–177)
Troponin I: <0.3 ng/mL (ref <0.30)
Troponin I: <0.3 ng/mL (ref <0.30)

## 2011-10-05 LAB — OCCULT BLOOD X 1 CARD TO LAB, STOOL: Fecal Occult Bld: NEGATIVE

## 2011-10-05 LAB — HEMOGLOBIN A1C
Hgb A1c MFr Bld: 6.1 % — ABNORMAL HIGH (ref <5.7)
Mean Plasma Glucose: 128 mg/dL — ABNORMAL HIGH (ref <117)

## 2011-10-05 LAB — T4, FREE: Free T4: 1.58 ng/dL (ref 0.80–1.80)

## 2011-10-05 LAB — T3: T3, Total: 83 ng/dl (ref 80.0–204.0)

## 2011-10-05 MED ORDER — ONDANSETRON HCL 4 MG/2ML IJ SOLN
4.0000 mg | Freq: Four times a day (QID) | INTRAMUSCULAR | Status: DC | PRN
  Administered 2011-10-05: 4 mg via INTRAVENOUS
  Filled 2011-10-05: qty 2

## 2011-10-05 MED ORDER — TECHNETIUM TC 99M TETROFOSMIN IV KIT
10.0000 | PACK | Freq: Once | INTRAVENOUS | Status: AC | PRN
  Administered 2011-10-05: 10 via INTRAVENOUS

## 2011-10-05 MED ORDER — REGADENOSON 0.4 MG/5ML IV SOLN
0.4000 mg | Freq: Once | INTRAVENOUS | Status: DC
  Filled 2011-10-05: qty 5

## 2011-10-05 MED ORDER — DEXTROSE 50 % IV SOLN
INTRAVENOUS | Status: AC
  Administered 2011-10-05: 25 mL
  Filled 2011-10-05: qty 50

## 2011-10-05 MED ORDER — TECHNETIUM TC 99M TETROFOSMIN IV KIT
30.0000 | PACK | Freq: Once | INTRAVENOUS | Status: AC | PRN
  Administered 2011-10-05: 30 via INTRAVENOUS

## 2011-10-05 MED ORDER — TOPIRAMATE 25 MG PO TABS
25.0000 mg | ORAL_TABLET | Freq: Every day | ORAL | Status: DC
  Filled 2011-10-05 (×2): qty 1

## 2011-10-05 MED ORDER — ENOXAPARIN SODIUM 40 MG/0.4ML ~~LOC~~ SOLN
40.0000 mg | SUBCUTANEOUS | Status: DC
  Administered 2011-10-05: 40 mg via SUBCUTANEOUS
  Filled 2011-10-05 (×2): qty 0.4

## 2011-10-05 NOTE — Consult Note (Signed)
[facility]  Consult Note    Date: 10/05/2011          Patient Name: Karen West  MRN: [id number]   DOB: [birth year]  Age/Sex: 71 y.o., female   PCP: Horatio Pel, MD     Referring Physician: Murlean Iba, MD  Reason for Consult: Headache     Chief Complaint: Headache with nausea and vomiting  History of Present Illness: Patient is a 71 y.o. female with a PMHx of DM type 2 with Hgb A1c 6.1,HLD, Obesity, OSA, HTN, GERD, Breast cancer s/p left mastectomy, Hyperthyroidism, Angina  who presents to [facility] for evaluation of chest pain and headache.  Reports that she has had headache for the past 2 years.  Has been at a frequency of about 1-2 per month.  Headache is holocranial and sharp.  After its onset she develops nausea and vomiting.  Once the nausea and vomiting have resolved the headache seems to resolve as well.  She can not relate the headaches to any particular activity.  She has seen a specialist in [facility].  An MRI of the brain with and without contrast was ordered.  It has been reviewed and is only remarkable for a osteoma/asymmetric hyperostosis.  She was told that it may be due to sinuses but she has no sinus symptoms.  For the past week her headaches have worsened.  They have progressed from a severity of 4/10 to now being 10/10.  They are  Still associated with nausea and vomiting and still resolve after the nausea and vomiting have resolved which continues to be 30 minutes to an hour.  She has not associated numbness, weakness or visual symptoms.  Prior to two years ago she reports having no problems with headaches.   Chest pain work up: CE negative x3 and Myoview normal with EF of 68 %  Of note:  1.Carotid Doppler 07/04/11 Extensive atherosclerotic disease involving the carotid arteries bilaterally.  Estimated degree of stenosis in the left internal carotid artery is 50-69%. Estimated degree of stenosis in the right internal carotid artery is less than  50%. 2. MRI 11/17/10:Atrophy and small vessel disease. No acute intracranial findings. Unchanged right frontal extra-axial process, likely osteoma or asymmetric hyperostosis.    Review of Systems: Constitutional:  denies fever, chills, diaphoresis, appetite change and fatigue.  HEENT: denies photophobia, eye pain, redness, hearing loss, ear pain, congestion, sore throat, rhinorrhea, sneezing, neck pain, neck stiffness and tinnitus.  Respiratory: denies SOB, DOE, cough, chest tightness, and wheezing.  Cardiovascular: admits to chest pain  Gastrointestinal: admits to nausea, vomiting  Genitourinary: denies dysuria, urgency, frequency, hematuria, flank pain and difficulty urinating.  Musculoskeletal: denies  myalgias, back pain, joint swelling, arthralgias and gait problem.   Skin: denies pallor, rash and wound.  Neurological: admits to as per HPI   Hematological: denies adenopathy, easy bruising, personal or family bleeding history.  Psychiatric/ Behavioral: denies suicidal ideation, mood changes, confusion, nervousness, sleep disturbance and agitation.    Medications: Outpatient medications: Prescriptions prior to admission  Medication Sig Dispense Refill  . amLODipine (NORVASC) 10 MG tablet Take 10 mg by mouth daily.      Marland Kitchen aspirin 81 MG tablet Take 81 mg by mouth daily.        . Calcium Carbonate (CALCIUM 600 PO) Take 1 tablet by mouth 2 (two) times daily.      Marland Kitchen ezetimibe (ZETIA) 10 MG tablet Take 10 mg by mouth daily.        Marland Kitchen  glipiZIDE (GLUCOTROL) 10 MG 24 hr tablet Take 10 mg by mouth 2 (two) times daily.        Marland Kitchen lisinopril-hydrochlorothiazide (PRINZIDE,ZESTORETIC) 20-12.5 MG per tablet Take 1 tablet by mouth daily.        . naproxen sodium (ANAPROX) 220 MG tablet Take 440 mg by mouth 2 (two) times daily as needed. For pain      . nitroGLYCERIN (NITROSTAT) 0.4 MG SL tablet Place 0.4 mg under the tongue every 5 (five) minutes as needed. For chest pain      . Omega-3 Fatty Acids  (FISH OIL) 1200 MG CAPS Take 1 capsule by mouth daily.      Marland Kitchen omeprazole (PRILOSEC) 20 MG capsule Take 20 mg by mouth daily.        . rosuvastatin (CRESTOR) 20 MG tablet Take 20 mg by mouth daily.      . Saxagliptin-Metformin (KOMBIGLYZE XR) 2.07-998 MG TB24 Take 1 tablet by mouth 2 (two) times daily.      Marland Kitchen aspirin 81 MG chewable tablet Chew 324 mg by mouth once.        Current medications: Current Facility-Administered Medications  Medication Dose Route Frequency Provider Last Rate Last Dose  . 0.9 %  sodium chloride infusion  250 mL Intravenous PRN Clanford L Johnson, MD      . amLODipine (NORVASC) tablet 10 mg  10 mg Oral Daily Clanford Marisa Hua, MD   10 mg at 10/05/11 1121  . aspirin EC tablet 81 mg  81 mg Oral Daily Clanford Marisa Hua, MD   81 mg at 10/05/11 1122  . atorvastatin (LIPITOR) tablet 40 mg  40 mg Oral q1800 Clanford Marisa Hua, MD   40 mg at 10/05/11 1127  . bisacodyl (DULCOLAX) EC tablet 5 mg  5 mg Oral Daily PRN Clanford L Johnson, MD      . dextrose 50 % solution        25 mL at 10/05/11 0618  . enoxaparin (LOVENOX) injection 40 mg  40 mg Subcutaneous Q24H Clanford L Johnson, MD      . ezetimibe (ZETIA) tablet 10 mg  10 mg Oral Daily Clanford L Johnson, MD   10 mg at 10/05/11 1122  . hydrochlorothiazide (MICROZIDE) capsule 12.5 mg  12.5 mg Oral Daily Clanford Marisa Hua, MD   12.5 mg at 10/05/11 1122  . insulin aspart (novoLOG) injection 0-15 Units  0-15 Units Subcutaneous TID WC Clanford L Johnson, MD      . lisinopril (PRINIVIL,ZESTRIL) tablet 20 mg  20 mg Oral Daily Clanford Marisa Hua, MD   20 mg at 10/05/11 1123  . morphine 2 MG/ML injection 1 mg  1 mg Intravenous Q3H PRN Clanford L Johnson, MD      . nitroGLYCERIN (NITROGLYN) 2 % ointment 0.5 inch  0.5 inch Topical Q8H Clanford L Johnson, MD      . ondansetron (ZOFRAN) 4 MG/2ML injection           . ondansetron (ZOFRAN) injection 4 mg  4 mg Intravenous Q6H PRN Clanford Marisa Hua, MD   4 mg at 10/05/11 1315  .  pantoprazole (PROTONIX) EC tablet 40 mg  40 mg Oral Q1200 Clanford Marisa Hua, MD   40 mg at 10/05/11 1122  . polyethylene glycol (MIRALAX / GLYCOLAX) packet 17 g  17 g Oral Daily Clanford Marisa Hua, MD   17 g at 10/05/11 1123  . regadenoson (LEXISCAN) injection SOLN 0.4 mg  0.4 mg Intravenous Once [employer], PA-C  0.4 mg at 10/05/11 0926  . regadenoson (LEXISCAN) injection SOLN 0.4 mg  0.4 mg Intravenous Once Rhonda G Barrett, PA      . sodium chloride 0.9 % injection 3 mL  3 mL Intravenous Q12H Clanford Marisa Hua, MD   3 mL at 10/04/11 2226  . sodium chloride 0.9 % injection 3 mL  3 mL Intravenous Q12H Clanford Marisa Hua, MD   3 mL at 10/05/11 1123  . sodium chloride 0.9 % injection 3 mL  3 mL Intravenous PRN Clanford L Johnson, MD      . technetium tetrofosmin (TC-MYOVIEW) injection [place] Intravenous Once PRN Medication Radiologist, MD   [place] at 10/05/11 0800  . technetium tetrofosmin (TC-MYOVIEW) injection 30 milli [place]ie  30 milli [place]ie Intravenous Once PRN Medication Radiologist, MD   [place] at 10/05/11 1000  . DISCONTD: 0.9 %  sodium chloride infusion   Intravenous Once Delora Fuel, MD      . DISCONTD: diphenhydrAMINE (BENADRYL) injection 25 mg  25 mg Intravenous Once Delora Fuel, MD      . DISCONTD: heparin injection 5,000 Units  5,000 Units Subcutaneous Q8H Clanford Marisa Hua, MD   5,000 Units at 10/05/11 0550  . DISCONTD: lisinopril-hydrochlorothiazide (PRINZIDE,ZESTORETIC) 20-12.5 MG per tablet 1 tablet  1 tablet Oral Daily Clanford Marisa Hua, MD      . DISCONTD: metoCLOPramide (REGLAN) injection 10 mg  10 mg Intravenous Once Delora Fuel, MD      . DISCONTD: nitroGLYCERIN (NITROGLYN) 2 % ointment 1 inch  1 inch Topical Once Delora Fuel, MD          Allergies: Allergies  Allergen Reactions  . Tramadol Nausea And Vomiting and Other (See Comments)    "got sick to my stomach and was throwing up blood; it put me in the hospital")       Past Medical History: Past Medical History  Diagnosis Date  . Hyperlipidemia   . Hypertension   . Chronic anemia   . GERD (gastroesophageal reflux disease)   . Obesity   . Osteoarthritis   . Right bundle branch block   . Atherosclerosis of aorta   . Multinodular goiter   . Type II or unspecified type diabetes mellitus without mention of complication, uncontrolled   . Carotid artery disease     [id number] w/ 0000000 LICA stenosis and Q000111Q RICA stenosis  . OSA on CPAP   . Chronic headaches   . Breast cancer     "cancer in both breasts; s/p left mastectomy     Past Surgical History: Past Surgical History  Procedure Date  . Tubal ligation 1968  . Mastectomy 2000    left  . Breast biopsy 1993; 1995; 2000    left; right; left     Family History: Family History  Problem Relation Age of Onset  . Diabetes Sister   . Diabetes Brother     x3  . Hypertension Brother     x3  . Heart disease Brother     x3  . Stroke Brother     x3  . Heart attack Mother 37    Mother Died of MI age 44  . Stroke Sister      Social History:  reports that she has never smoked. She has never used smokeless tobacco. She reports that she drinks alcohol. She reports that she does not use illicit drugs.  She is a retired Optometrist.   Vital Signs: Blood  pressure 136/75, pulse 101, temperature 97.3 F (36.3 C), temperature source Oral, resp. rate 18, height 5\' 2"  (1.575 m), weight 180 lb (81.647 kg), SpO2 93.00%.  Weight trends: Filed Weights   10/04/11 1352  Weight: 180 lb (81.647 kg)    Neurologic Exam:   Mental Status: Alert, oriented, thought content appropriate.  Speech fluent without evidence of aphasia. Able to follow 3 step commands without difficulty.  Cranial Nerves:   II: Visual fields grossly intact.  III/IV/VI: Extraocular movements intact.  Pupils reactive bilaterally.  V/VII: Smile symmetric. facial light touch sensation normal bilaterally.  VIII: Grossly intact.   IX/X: Normal gag.  XI: Bilateral shoulder shrug normal.  XII: Midline tongue extension normal.  Motor:  5/5 bilaterally with normal tone and bulk  Sensory:  Pinprick and light touch intact throughout, bilaterally  DTRs: 2+ in the upper extremities and absent in the lower extremities  Plantars:  Downgoing bilaterally  Cerebellar: Normal finger-to-nose and normal heel-to-shin test.       Lab results: Basic Metabolic Panel:  Lab [id number] 0530 10/04/11 1823 10/04/11 1433  NA 142 -- 138  K 3.9 -- 3.8  CL 103 -- 100  CO2 24 -- 23  GLUCOSE 61* -- 135*  BUN 22 -- 28*  CREATININE 0.91 0.80 0.89  CALCIUM 10.1 -- 10.3  MG -- -- --  PHOS -- -- --    CBC:  Lab 10/05/11 0530 10/04/11 1823 10/04/11 1433  WBC 5.6 8.4 12.0*  NEUTROABS -- -- 10.3*  HGB 11.6* 12.4 13.1  HCT 34.6* 36.5 38.5  MCV 87.6 87.3 87.9  PLT 240 305 236    Cardiac Enzymes:  Lab 10/05/11 1117 10/05/11 0050 10/04/11 1824 10/04/11 1434  CKTOTAL 96 93 107 --  CKMB 2.6 2.9 2.9 --  CKMBINDEX -- -- -- --  TROPONINI <0.30 <0.30 <0.30 <0.30    BNP: No components found with this basename: POCBNP:3  CBG:  Lab 10/05/11 1116 10/05/11 0635 10/05/11 0555 10/04/11 2140  GLUCAP 182* 138* 58* 203*    Microbiology: Results for orders placed in visit on 01/11/09  [facility] SMEAR     Status: Normal   Collection Time   01/14/09  3:40 PM      Component Value Range Status Comment   Smear Result Smear Available   Final     Coagulation Studies:  Basename 10/04/11 1433  LABPROT 11.7  INR 0.84    Urinalysis: No results found for this basename: COLORURINE:2,APPERANCEUR:2,LABSPEC:2,PHURINE:2,GLUCOSEU:2,HGBUR:2,BILIRUBINUR:2,KETONESUR:2,PROTEINUR:2,UROBILINOGEN:2,NITRITE:2,LEUKOCYTESUR:2 in the last 72 hours    Imaging: Nm Myocar Multi W/spect W/wall Motion / Ef  10/05/2011  Lexiscan Myovue:  Indication: Chest Pain  The patient received .4 mg of Lexiscan as a bolus.  Baseline ECG showed RBBB No changes with injection.   HR stable at 97 bpm, BP stable at 146/72 mmHg No symptoms  Resting and stress images were normal No ischemia or infarction Gated images normal with no RWMA;s EF 68%  Impression: Normal lexiscan myovue EF 68%  [name] MD [facility]  Original Report Authenticated By: [place]   Dg Chest Portable 1 View  10/04/2011  *RADIOLOGY REPORT*  Clinical Data: Chest pain, headache  PORTABLE CHEST - 1 VIEW  Comparison: Chest x-ray of 03/23/2009 and CT chest of 09/09/2010  Findings: The lungs are poorly aerated with basilar volume loss. Cardiomegaly is stable.  There are degenerative changes throughout the thoracic spine.  Calcification in the left lower neck probably is within the thyroid gland.  IMPRESSION: Poor inspiration chest x-ray with basilar volume  loss.  No definite active process.  Stable cardiomegaly.  Original Report Authenticated By: Joretta Bachelor, M.D.      Assessment & Plan: Pt is a 71 y.o. yo female admitted for chest pain, also with complaints of headache, nausea and vomiting.  Have been present now for the past two years.  Imaging shows no intracerebral process that would lead to this presentation.  This is a slightly atypical presentation for migraine, not only in age of onset but also in character, but this may represent a migraine variant, abdominal migraine, etc.  Will start prophylaxis.  ESR unremarkable.  TSH low with further work up pending.  This may be contributing as well.  If headaches not addressed with prophylaxis may need a GI work up.  Assessment:  1. Headache, chronic . MRI 11/17/10:Atrophy and small vessel disease. No acute intracranial findings. Unchanged right frontal extra-axial process, likely osteoma or asymmetric hyperostosis. 2. OSA 3. HTN 4. HLD 5. DM, Type 2  6. Hyperthyroidism    Plan: - Will start patient on Topamax 25 mg QHS for prophylaxis - Will order Vitamin D and Vitamin B12 - Norvasc and Lisinopril can cause up to 7 % headache.  Although this is on the  differential would not change at this time.  If patient remains intractable though this would be a consideration.    - Needs follow up as an outpatient with neurology   DVT PPX - Lovenox   Patient history and plan of care reviewed with neurology attending, Dr. Alexis Goodell.  Signed: Rosalia Hammers, MD PGY-II, Internal Medicine Resident 10/05/2011, 2:36 PM    Patient seen and examined.  Clinical course and management discussed.  Necessary edits performed.  I agree with the above.  Alexis Goodell, MD [facility] [phone]  10/05/2011  5:40 PM

## 2011-10-05 NOTE — Progress Notes (Signed)
VASCULAR LAB PRELIMINARY  PRELIMINARY  PRELIMINARY  PRELIMINARY  Carotid duplex completed.   Preliminary report:  Right - No evidence of significant ICA stenosis. Left -   40-59% internal carotid artery stenosis.  Vertebral artery flow is antegrade bilaterally No significant change since study completed at [place] 07/07/11.  Raevin Wierenga, RVS 10/05/2011, 3:56 PM

## 2011-10-05 NOTE — Progress Notes (Signed)
Patient Name: Karen West Date of Encounter: 10/05/2011  Principal Problem:  *Chest pain at rest Active Problems:  DIABETES, TYPE 2  HYPERLIPIDEMIA  OBESITY  OBSTRUCTIVE SLEEP APNEA  HYPERTENSION  G E R D  OSTEOARTHRITIS  Hyperlipidemia  Breast cancer  Chronic anemia  Right bundle branch block  Type II or unspecified type diabetes mellitus without mention of complication, uncontrolled  Angina at rest  Headache  FH: MI (myocardial infarction)  Chronic headaches  Chest pressure    SUBJECTIVE: No more chest pain, no SOB.  OBJECTIVE Filed Vitals:   10/04/11 1743 10/04/11 2010 10/05/11 0553 10/05/11 0927  BP: 154/83 115/71 117/71 127/60  Pulse:    68  Temp: 97.3 F (36.3 C)     TempSrc: Oral Oral Oral   Resp: 20 19 18    Height:      Weight:      SpO2: 95% 94% 93%     Intake/Output Summary (Last 24 hours) at 10/05/11 0954 Last data filed at 10/05/11 0135  Gross per 24 hour  Intake      0 ml  Output    800 ml  Net   -800 ml   Weight change:  Filed Weights   10/04/11 1352  Weight: 180 lb (81.647 kg)     PHYSICAL EXAM General: Well developed, well nourished, female in no acute distress. Head: Normocephalic, atraumatic.  Neck: Supple without bruits, JVD not elevated. Lungs:  Resp regular and unlabored, CTA bilaterally except a few basilar rales. Heart: RRR, S1, S2, no S3, S4, faint murmur. Abdomen: Soft, non-tender, non-distended, BS + x 4.  Extremities: No clubbing, cyanosis, no edema.  Neuro: Alert and oriented X 3. Moves all extremities spontaneously. Psych: Normal affect.  LABS: CBC: Basename 10/05/11 0530 10/04/11 1823 10/04/11 1433  WBC 5.6 8.4 --  NEUTROABS -- -- 10.3*  HGB 11.6* 12.4 --  HCT 34.6* 36.5 --  MCV 87.6 87.3 --  PLT 240 305 --   INR: Basename 10/04/11 1433  INR 0000000   Basic Metabolic Panel: Basename [id number] 0530 10/04/11 1823 10/04/11 1433  NA 142 -- 138  K 3.9 -- 3.8  CL 103 -- 100  CO2 24 -- 23  GLUCOSE 61* --  135*  BUN 22 -- 28*  CREATININE 0.91 0.80 --  CALCIUM 10.1 -- 10.3  MG -- -- --  PHOS -- -- --   Cardiac Enzymes: Basename 10/05/11 0050 10/04/11 1824 10/04/11 1434  CKTOTAL 93 107 --  CKMB 2.9 2.9 --  CKMBINDEX -- -- --  TROPONINI <0.30 <0.30 <0.30    BNP: Pro B Natriuretic peptide (BNP)  Date/Time Value Range Status  10/04/2011  6:24 PM 153.8* 0 - 125 pg/mL Final   Hemoglobin A1C: Basename 10/04/11 1823  HGBA1C 6.1*   Fasting Lipid Panel: Basename 10/05/11 0530  CHOL 141  HDL 49  LDLCALC 65  TRIG 137  CHOLHDL 2.9  LDLDIRECT --   Thyroid Function Tests: Basename 10/04/11 1823  TSH 0.218*  T4TOTAL --  T3FREE --  THYROIDAB --   TELE:  SR,     ECG: SR, RBBB  Radiology/Studies: Dg Chest Portable 1 View 10/04/2011  *RADIOLOGY REPORT*  Clinical Data: Chest pain, headache  PORTABLE CHEST - 1 VIEW  Comparison: Chest x-ray of 03/23/2009 and CT chest of 09/09/2010  Findings: The lungs are poorly aerated with basilar volume loss. Cardiomegaly is stable.  There are degenerative changes throughout the thoracic spine.  Calcification in the left lower neck probably is  within the thyroid gland.  IMPRESSION: Poor inspiration chest x-ray with basilar volume loss.  No definite active process.  Stable cardiomegaly.  Original Report Authenticated By: Joretta Bachelor, M.D.    Current Medications:     . amLODipine  10 mg Oral Daily  . aspirin EC  81 mg Oral Daily  . atorvastatin  40 mg Oral q1800  . dextrose      . ezetimibe  10 mg Oral Daily  . heparin  5,000 Units Subcutaneous Q8H  . hydrochlorothiazide  12.5 mg Oral Daily  . insulin aspart  0-15 Units Subcutaneous TID WC  . lisinopril  20 mg Oral Daily  . nitroGLYCERIN  0.5 inch Topical Q8H  . ondansetron      . pantoprazole  40 mg Oral Q1200  . polyethylene glycol  17 g Oral Daily  . regadenoson  0.4 mg Intravenous Once  . regadenoson  0.4 mg Intravenous Once  . sodium chloride  3 mL Intravenous Q12H  . sodium chloride   3 mL Intravenous Q12H      ASSESSMENT AND PLAN: Principal Problem:  *Chest pain at rest - enzymes negative for MI, SX resolved. OK for stress test. RBBB is old.  Otherwise, per primary MD (consider further thyroid studies with low TSH). Active Problems:  DIABETES, TYPE 2  HYPERLIPIDEMIA  OBESITY  OBSTRUCTIVE SLEEP APNEA  HYPERTENSION  G E R D  OSTEOARTHRITIS  Hyperlipidemia  Breast cancer  Chronic anemia  Right bundle branch block  Type II or unspecified type diabetes mellitus without mention of complication, uncontrolled  Angina at rest  Headache  FH: MI (myocardial infarction)  Chronic headaches  Chest pressure  Signed, Rosaria Ferries , PA-C 9:54 AM 10/05/2011  Addendum:  Carlton Adam results - Impression: Normal lexiscan myovue EF 68%  No further cardiac workup indicated at this time. Please re-contact us if we can be of further help. Suanne Marker Barrett 10/05/2011 4:08 PM  Attending Note:   The patient was seen and examined.  Agree with assessment and plan as noted above.  Talked with patient.  Reviewed the myoview images. Agree with findings.   Will sign off.  Call for questions.  Thayer Headings, Brooke Bonito., MD, [facility] 10/05/2011, 4:44 PM

## 2011-10-05 NOTE — Progress Notes (Signed)
[facility] Progress Note  10/05/2011  Subjective: Pt reporting another episode of headache and severe nausea and vomiting.  It came on suddenly after arriving to room from [facility].  She says the episodes are getting worse. She says she feels dizzy, lightheaded, no photophobia, no aura, associated with sweating, palpitations, large amount of vomitus seen today. Pt says she has had 3 MRI studies done in last 2 years. She has seen a neurologist and told it wasn't migraines.  She is requesting to see a neurologist now.  No further chest pain.  No SOB.    Objective:  Vital signs in last 24 hours: Filed Vitals:   10/05/11 1000 10/05/11 1002 10/05/11 1003 10/05/11 1121  BP: 150/65 149/69 146/72 136/75  Pulse: 88 107 101   Temp:      TempSrc:      Resp:      Height:      Weight:      SpO2:       Weight change:   Intake/Output Summary (Last 24 hours) at 10/05/11 1351 Last data filed at 10/05/11 0135  Gross per 24 hour  Intake      0 ml  Output    800 ml  Net   -800 ml   Lab Results  Component Value Date   HGBA1C 6.1* 10/04/2011   Lab Results  Component Value Date   LDLCALC 65 10/05/2011   CREATININE 0.91 10/05/2011    Review of Systems As above, otherwise all reviewed and reported negative  Physical Exam General - awake, no distress, cooperative HEENT - NCAT, MMM Lungs - BBS, CTA CV - normal s1, s2 sounds Abd - soft, nondistended, no masses, nontender Ext - no C/C/E  Lab Results: Results for orders placed during the hospital encounter of 10/04/11 (from the past 24 hour(s))  CBC WITH DIFFERENTIAL     Status: Abnormal   Collection Time   10/04/11  2:33 PM      Component Value Range   WBC 12.0 (*) 4.0 - 10.5 K/uL   RBC 4.38  3.87 - 5.11 MIL/uL   Hemoglobin 13.1  12.0 - 15.0 g/dL   HCT 38.5  36.0 - 46.0 %   MCV 87.9  78.0 - 100.0 fL   MCH 29.9  26.0 - 34.0 pg   MCHC 34.0  30.0 - 36.0 g/dL   RDW 14.4  11.5 - 15.5 %   Platelets 236  150 - 400 K/uL   Neutrophils  Relative 86 (*) 43 - 77 %   Neutro Abs 10.3 (*) 1.7 - 7.7 K/uL   Lymphocytes Relative 10 (*) 12 - 46 %   Lymphs Abs 1.2  0.7 - 4.0 K/uL   Monocytes Relative 4  3 - 12 %   Monocytes Absolute 0.4  0.1 - 1.0 K/uL   Eosinophils Relative 0  0 - 5 %   Eosinophils Absolute 0.1  0.0 - 0.7 K/uL   Basophils Relative 0  0 - 1 %   Basophils Absolute 0.0  0.0 - 0.1 K/uL  BASIC METABOLIC PANEL     Status: Abnormal   Collection Time   10/04/11  2:33 PM      Component Value Range   Sodium 138  135 - 145 mEq/L   Potassium 3.8  3.5 - 5.1 mEq/L   Chloride 100  96 - 112 mEq/L   CO2 23  19 - 32 mEq/L   Glucose, Bld 135 (*) 70 - 99 mg/dL   BUN  28 (*) 6 - 23 mg/dL   Creatinine, Ser 0.89  0.50 - 1.10 mg/dL   Calcium 10.3  8.4 - 10.5 mg/dL   GFR calc non Af Amer 65 (*) >90 mL/min   GFR calc Af Amer 75 (*) >90 mL/min  PROTIME-INR     Status: Normal   Collection Time   10/04/11  2:33 PM      Component Value Range   Prothrombin Time 11.7  11.6 - 15.2 seconds   INR 0.84  0.00 - 1.49  APTT     Status: Abnormal   Collection Time   10/04/11  2:33 PM      Component Value Range   aPTT 22 (*) 24 - 37 seconds  SEDIMENTATION RATE     Status: Abnormal   Collection Time   10/04/11  2:33 PM      Component Value Range   Sed Rate 25 (*) 0 - 22 mm/hr  TROPONIN I     Status: Normal   Collection Time   10/04/11  2:34 PM      Component Value Range   Troponin I <0.30  <0.30 ng/mL  CBC     Status: Normal   Collection Time   10/04/11  6:23 PM      Component Value Range   WBC 8.4  4.0 - 10.5 K/uL   RBC 4.18  3.87 - 5.11 MIL/uL   Hemoglobin 12.4  12.0 - 15.0 g/dL   HCT 36.5  36.0 - 46.0 %   MCV 87.3  78.0 - 100.0 fL   MCH 29.7  26.0 - 34.0 pg   MCHC 34.0  30.0 - 36.0 g/dL   RDW 14.4  11.5 - 15.5 %   Platelets 305  150 - 400 K/uL  CREATININE, SERUM     Status: Abnormal   Collection Time   10/04/11  6:23 PM      Component Value Range   Creatinine, Ser 0.80  0.50 - 1.10 mg/dL   GFR calc non Af Amer 74 (*) >90  mL/min   GFR calc Af Amer 85 (*) >90 mL/min  TSH     Status: Abnormal   Collection Time   10/04/11  6:23 PM      Component Value Range   TSH 0.218 (*) 0.350 - 4.500 uIU/mL  HEMOGLOBIN A1C     Status: Abnormal   Collection Time   10/04/11  6:23 PM      Component Value Range   Hemoglobin A1C 6.1 (*) <5.7 %   Mean Plasma Glucose 128 (*) <117 mg/dL  CARDIAC PANEL(CRET KIN+CKTOT+MB+TROPI)     Status: Abnormal   Collection Time   10/04/11  6:24 PM      Component Value Range   Total CK 107  7 - 177 U/L   CK, MB 2.9  0.3 - 4.0 ng/mL   Troponin I <0.30  <0.30 ng/mL   Relative Index 2.7 (*) 0.0 - 2.5  PRO B NATRIURETIC PEPTIDE     Status: Abnormal   Collection Time   10/04/11  6:24 PM      Component Value Range   Pro B Natriuretic peptide (BNP) 153.8 (*) 0 - 125 pg/mL  GLUCOSE, CAPILLARY     Status: Abnormal   Collection Time   10/04/11  9:40 PM      Component Value Range   Glucose-Capillary 203 (*) 70 - 99 mg/dL   Comment 1 Notify RN    CARDIAC PANEL(CRET KIN+CKTOT+MB+TROPI)  Status: Normal   Collection Time   10/05/11 12:50 AM      Component Value Range   Total CK 93  7 - 177 U/L   CK, MB 2.9  0.3 - 4.0 ng/mL   Troponin I <0.30  <0.30 ng/mL   Relative Index RELATIVE INDEX IS INVALID  0.0 - 2.5  BASIC METABOLIC PANEL     Status: Abnormal   Collection Time   10/05/11  5:30 AM      Component Value Range   Sodium 142  135 - 145 mEq/L   Potassium 3.9  3.5 - 5.1 mEq/L   Chloride 103  96 - 112 mEq/L   CO2 24  19 - 32 mEq/L   Glucose, Bld 61 (*) 70 - 99 mg/dL   BUN 22  6 - 23 mg/dL   Creatinine, Ser 0.91  0.50 - 1.10 mg/dL   Calcium 10.1  8.4 - 10.5 mg/dL   GFR calc non Af Amer 63 (*) >90 mL/min   GFR calc Af Amer 73 (*) >90 mL/min  CBC     Status: Abnormal   Collection Time   10/05/11  5:30 AM      Component Value Range   WBC 5.6  4.0 - 10.5 K/uL   RBC 3.95  3.87 - 5.11 MIL/uL   Hemoglobin 11.6 (*) 12.0 - 15.0 g/dL   HCT 34.6 (*) 36.0 - 46.0 %   MCV 87.6  78.0 - 100.0 fL    MCH 29.4  26.0 - 34.0 pg   MCHC 33.5  30.0 - 36.0 g/dL   RDW 14.4  11.5 - 15.5 %   Platelets 240  150 - 400 K/uL  LIPID PANEL     Status: Normal   Collection Time   10/05/11  5:30 AM      Component Value Range   Cholesterol 141  0 - 200 mg/dL   Triglycerides 137  <150 mg/dL   HDL 49  >39 mg/dL   Total CHOL/HDL Ratio 2.9     VLDL 27  0 - 40 mg/dL   LDL Cholesterol 65  0 - 99 mg/dL  GLUCOSE, CAPILLARY     Status: Abnormal   Collection Time   10/05/11  5:55 AM      Component Value Range   Glucose-Capillary 58 (*) 70 - 99 mg/dL   Comment 1 Notify RN    GLUCOSE, CAPILLARY     Status: Abnormal   Collection Time   10/05/11  6:35 AM      Component Value Range   Glucose-Capillary 138 (*) 70 - 99 mg/dL  GLUCOSE, CAPILLARY     Status: Abnormal   Collection Time   10/05/11 11:16 AM      Component Value Range   Glucose-Capillary 182 (*) 70 - 99 mg/dL  CARDIAC PANEL(CRET KIN+CKTOT+MB+TROPI)     Status: Normal   Collection Time   10/05/11 11:17 AM      Component Value Range   Total CK 96  7 - 177 U/L   CK, MB 2.6  0.3 - 4.0 ng/mL   Troponin I <0.30  <0.30 ng/mL   Relative Index RELATIVE INDEX IS INVALID  0.0 - 2.5   Micro Results: No results found for this or any previous visit (from the past 240 hour(s)).  Medications:  Scheduled Meds:   . amLODipine  10 mg Oral Daily  . aspirin EC  81 mg Oral Daily  . atorvastatin  40 mg Oral q1800  .  dextrose      . enoxaparin (LOVENOX) injection  40 mg Subcutaneous Q24H  . ezetimibe  10 mg Oral Daily  . hydrochlorothiazide  12.5 mg Oral Daily  . insulin aspart  0-15 Units Subcutaneous TID WC  . lisinopril  20 mg Oral Daily  . nitroGLYCERIN  0.5 inch Topical Q8H  . ondansetron      . pantoprazole  40 mg Oral Q1200  . polyethylene glycol  17 g Oral Daily  . regadenoson  0.4 mg Intravenous Once  . regadenoson  0.4 mg Intravenous Once  . sodium chloride  3 mL Intravenous Q12H  . sodium chloride  3 mL Intravenous Q12H  . DISCONTD: sodium  chloride   Intravenous Once  . DISCONTD: diphenhydrAMINE  25 mg Intravenous Once  . DISCONTD: heparin  5,000 Units Subcutaneous Q8H  . DISCONTD: lisinopril-hydrochlorothiazide  1 tablet Oral Daily  . DISCONTD: metoCLOPramide (REGLAN) injection  10 mg Intravenous Once  . DISCONTD: nitroGLYCERIN  1 inch Topical Once   Continuous Infusions:  PRN Meds:.sodium chloride, bisacodyl, morphine injection, ondansetron (ZOFRAN) IV, sodium chloride, technetium tetrofosmin, technetium tetrofosmin  Assessment/Plan: Chest Pain at rest Stress myoview done today, results pending, appreciate cardiology consult  DM type 2 continue to monitor BS, provide supplemental insulin as needed  OSA Nightly CPAP ordered  Headaches with nausea and vomiting Consult neurology service for eval and opinion  GERD Continue protonix  Carotid vascular Disease Doppler study today  Hyperthyroidism Pt says she is followed by an endocrinology and has had multiple thyroid biopsies Ordered Free T4   LOS: 1 day   Karen West 10/05/2011, 1:51 PM  Murlean Iba, MD, CDE, FAAFP [facility] [place], [place]

## 2011-10-05 NOTE — Progress Notes (Signed)
OT Cancellation Note  Attempted to see pt earlier but she was nauseated and nursing was preparing to give her medication to help with this, as she had just finished vomiting.  Now down for procedure.  Will attempt to check back later today and prceed with eval if time permits.  Mala Gibbard OTR/L 10/05/2011, 2:26 PM Pager number [phone]

## 2011-10-05 NOTE — Evaluation (Signed)
Physical Therapy Evaluation Patient Details Name: Karen West MRN: [id number] DOB: 1941/02/04 Today's Date: 10/05/2011 Time: [id number] PT Time Calculation (min): 13 min  PT Assessment / Plan / Recommendation Clinical Impression  pt presents with CP and N/V.  pt continues to be nauseated and have headache.  pt A'd to W/C to go to vascular.  Anticipate once nausea clears pt will be moving well.      PT Assessment  Patient needs continued PT services    Follow Up Recommendations  Home health PT    Barriers to Discharge Decreased caregiver support pt lives alone.      Equipment Recommendations  None recommended by PT    Recommendations for Other Services     Frequency Min 3X/week    Precautions / Restrictions Precautions Precautions: Fall Restrictions Weight Bearing Restrictions: No   Pertinent Vitals/Pain Headache "Terrible"      Mobility  Bed Mobility Bed Mobility: Not assessed (pt sitting EOB) Transfers Transfers: Sit to Stand;Stand to Sit Sit to Stand: 4: Min guard;With upper extremity assist;From bed Stand to Sit: 4: Min guard;With upper extremity assist (To W/C with Transporter) Details for Transfer Assistance: pt moving well, just slowly and very cautiously secondary to nausea and headache.   Ambulation/Gait Ambulation/Gait Assistance: 4: Min guard Ambulation Distance (Feet): 10 Feet Assistive device: 1 person hand held assist Ambulation/Gait Assistance Details: pt moves slowly and cautiously secondary to nausea and headache.   Gait Pattern: Step-through pattern;Decreased stride length Stairs: No Wheelchair Mobility Wheelchair Mobility: No    Exercises     PT Diagnosis: Difficulty walking;Acute pain  PT Problem List: Decreased activity tolerance;Decreased balance;Decreased mobility;Decreased knowledge of use of DME;Pain PT Treatment Interventions: DME instruction;Gait training;Stair training;Functional mobility training;Therapeutic activities;Therapeutic  exercise;Balance training;Neuromuscular re-education;Patient/family education   PT Goals Acute Rehab PT Goals PT Goal Formulation: With patient Time For Goal Achievement: 10/19/11 Potential to Achieve Goals: Good Pt will go Supine/Side to Sit: Independently PT Goal: Supine/Side to Sit - Progress: Goal set today Pt will go Sit to Supine/Side: Independently PT Goal: Sit to Supine/Side - Progress: Goal set today Pt will go Sit to Stand: Independently PT Goal: Sit to Stand - Progress: Goal set today Pt will go Stand to Sit: Independently PT Goal: Stand to Sit - Progress: Goal set today Pt will Ambulate: >150 feet;Independently PT Goal: Ambulate - Progress: Goal set today Pt will Go Up / Down Stairs: 1-2 stairs;Independently PT Goal: Up/Down Stairs - Progress: Goal set today  Visit Information  Last PT Received On: 10/05/11 Assistance Needed: +1    Subjective Data  Subjective: I just feel like I'm spinning and nauseated.   Patient Stated Goal: Back to normal.     Prior Functioning  Home Living Lives With: Alone Type of Home: House Home Access: Stairs to enter Technical brewer of Steps: 1 Entrance Stairs-Rails: None Home Layout: One level Bathroom Shower/Tub: Multimedia programmer: Handicapped height Bathroom Accessibility: Yes How Accessible: Accessible via walker Home Adaptive Equipment: Bedside commode/3-in-1 Prior Function Level of Independence: Independent Able to Take Stairs?: Yes Driving: Yes Vocation: Retired Corporate investment banker: No difficulties    Cognition  Overall Cognitive Status: Appears within functional limits for tasks assessed/performed Arousal/Alertness: Awake/alert Orientation Level: Appears intact for tasks assessed Behavior During Session: [facility] for tasks performed    Extremity/Trunk Assessment Right Lower Extremity Assessment RLE ROM/Strength/Tone: WFL for tasks assessed RLE Sensation: WFL - Light Touch Left Lower  Extremity Assessment LLE ROM/Strength/Tone: WFL for tasks assessed LLE Sensation: WFL - Light  Touch   Balance Balance Balance Assessed: No  End of Session PT - End of Session Equipment Utilized During Treatment: Gait belt Activity Tolerance:  (Limited by nausea) Patient left:  (in W/C with Transporter.  ) Nurse Communication: Mobility status  GP     NelsonThornton Papas, [place] 10/05/2011, 2:03 PM

## 2011-10-05 NOTE — Care Management Note (Signed)
    Page 1 of 1   10/06/2011     10:01:32 AM   CARE MANAGEMENT NOTE 10/06/2011  Patient:  Karen West, Karen West   Account Number:  [id number]  Date Initiated:  10/05/2011  Documentation initiated by:  SIMMONS,Jaleigh Mccroskey  Subjective/Objective Assessment:   ADMITTED WITH CHEST PAIN; LIVES AT [place]; WAS IPTA.     Action/Plan:   DISCHARGE PLANNING INITIATED.   Anticipated DC Date:  10/06/2011   Anticipated DC Plan:  [place]  CM consult      Choice offered to / List presented to:  C-1 Patient           Status of service:  Completed, signed off Medicare Important Message given?   (If response is "NO", the following Medicare IM given date fields will be blank) Date Medicare IM given:   Date Additional Medicare IM given:    Discharge Disposition:  HOME/SELF CARE  Per UR Regulation:  Reviewed for med. necessity/level of care/duration of stay  If discussed at Long Length of Stay Meetings, dates discussed:    Comments:  10/06/11  [place], BSN [phone] PT DECLINED [place].  10/05/11  [place], BSN [phone] RECEIVED REFERRAL FOR HHPT; PT OFF UNIT AT THIS TIME; [place] PROVIDER LIST LEFT WITH FAMILY MEMBER FOR REVIEW; NCM WILL CONTNUE TO FOLLOW.  10/05/11  [place], BSN [phone] NCM WILL FOLLOW.

## 2011-10-05 NOTE — Progress Notes (Signed)
CBG: 58  Treatment: D50 IV 25 mL  Symptoms: None  Follow-up CBG: [id number] CBG Result:138  Possible Reasons for Event: Other: NPO  Comments/MD notified:    Laurier Nancy

## 2011-10-05 NOTE — Progress Notes (Signed)
  Echocardiogram 2D Echocardiogram has been performed.  Karen West [name] 10/05/2011, 2:54 PM

## 2011-10-05 NOTE — Evaluation (Signed)
Occupational Therapy Evaluation Patient Details Name: Karen West MRN: [id number] DOB: December 21, 1940 Today's Date: 10/05/2011 Time: [id number] OT Time Calculation (min): 17 min  OT Assessment / Plan / Recommendation Clinical Impression  Pleasant 71 yr old female admitted with nausea and chest pain.  currently modified independent with simulated selfcare tasks and functional transfers.  Has all needed DME for selfcare tasks at home.  Feel she is slightly weaker secondary to not eating well and not being up on her feet but did not notice significant balance or functional issues to warrant further OT needs.  Will sign off.    OT Assessment  Patient does not need any further OT services    Follow Up Recommendations  No OT follow up       Equipment Recommendations  None recommended by PT          Precautions / Restrictions Precautions Precautions: Fall Restrictions Weight Bearing Restrictions: No   Pertinent Vitals/Pain O2 sats 97% and HR 96 BPM after mobility    ADL  Eating/Feeding: Performed;Independent Where Assessed - Eating/Feeding: Edge of bed Grooming: Modified independent;Performed Where Assessed - Grooming: Unsupported standing Upper Body Bathing: Simulated;Modified independent Where Assessed - Upper Body Bathing: Unsupported sitting Lower Body Bathing: Simulated;Modified independent Where Assessed - Lower Body Bathing: Unsupported sit to stand Upper Body Dressing: Performed;Modified independent Where Assessed - Upper Body Dressing: Unsupported standing (to donn robe) Lower Body Dressing: Performed;Modified independent Where Assessed - Lower Body Dressing: Unsupported sit to stand Toilet Transfer: Performed;Modified independent Toilet Transfer Method: Other (comment) (ambulate to toilet) Toilet Transfer Equipment: Comfort height toilet Toileting - Clothing Manipulation and Hygiene: Performed;Modified independent Where Assessed - Toileting Clothing Manipulation and  Hygiene: Sit to stand from 3-in-1 or toilet Tub/Shower Transfer: Simulated;Modified independent Tub/Shower Transfer Method: Therapist, art: Shower seat without back Transfers/Ambulation Related to ADLs: Pt overall modified independent with mobility.  Moves slightly slower than normal secondary to feeling a little weak.  Occassionlly would reach for rail in the hallway for support. ADL Comments: Overall modified independent for simulated selfcare tasks.  Has built in shower seat and handicapped toilets.        Visit Information  Last OT Received On: 10/05/11 Assistance Needed: +1    Subjective Data  Subjective: "I could do better if I had some food." Patient Stated Goal: Pt wants to eat regular food and get home soon.   Prior Functioning  Vision/Perception  Home Living Lives With: Alone Type of Home: House Home Access: Stairs to enter Entrance Stairs-Number of Steps: 1 Entrance Stairs-Rails: None Home Layout: One level Bathroom Shower/Tub: Walk-in shower;Other (comment) (Has built in seat.) Bathroom Toilet: Handicapped height Bathroom Accessibility: Yes How Accessible: Accessible via walker Home Adaptive Equipment: Bedside commode/3-in-1 Prior Function Level of Independence: Independent Able to Take Stairs?: Yes Driving: Yes Vocation: Retired Corporate investment banker: No difficulties   Vision - Assessment Vision Assessment: Vision not tested Perception Perception: Within Functional Limits Praxis Praxis: Intact  Cognition  Overall Cognitive Status: Appears within functional limits for tasks assessed/performed Arousal/Alertness: Awake/alert Orientation Level: Appears intact for tasks assessed Behavior During Session: [facility] for tasks performed    Extremity/Trunk Assessment Right Upper Extremity Assessment RUE ROM/Strength/Tone: Within functional levels RUE Sensation: WFL - Light Touch RUE Coordination: WFL - gross/fine motor Left Upper  Extremity Assessment LUE ROM/Strength/Tone: Within functional levels LUE Sensation: WFL - Light Touch LUE Coordination: WFL - gross/fine motor Right Lower Extremity Assessment RLE ROM/Strength/Tone: WFL for tasks assessed RLE Sensation: WFL - Light Touch Left  Lower Extremity Assessment LLE ROM/Strength/Tone: WFL for tasks assessed LLE Sensation: WFL - Light Touch Trunk Assessment Trunk Assessment: Normal   Mobility Bed Mobility Bed Mobility: Not assessed (pt sitting EOB) Transfers Transfers: Sit to Stand Sit to Stand: 6: Modified independent (Device/Increase time);With upper extremity assist Stand to Sit: 6: Modified independent (Device/Increase time);Without upper extremity assist Details for Transfer Assistance: pt moving well, just slowly and very cautiously secondary to nausea and headache.        Balance Balance Balance Assessed: Yes Dynamic Standing Balance Dynamic Standing - Balance Support: No upper extremity supported Dynamic Standing - Level of Assistance: 6: Modified independent (Device/Increase time) Dynamic Standing - Balance Activities: Reaching for objects Standardized Balance Assessment Standardized Balance Assessment: Berg Balance Test Berg Balance Test Sit to Stand: Able to stand without using hands and stabilize independently Standing Unsupported: Able to stand safely 2 minutes Sitting with Back Unsupported but Feet Supported on Floor or Stool: Able to sit safely and securely 2 minutes Stand to Sit: Controls descent by using hands Transfers: Able to transfer safely, minor use of hands Standing Unsupported with Eyes Closed: Able to stand 10 seconds safely Standing Ubsupported with Feet Together: Able to place feet together independently and stand 1 minute safely From Standing, Reach Forward with Outstretched Arm: Can reach forward >12 cm safely (5") From Standing Position, Pick up Object from Floor: Able to pick up shoe safely and easily From Standing Position,  Turn to Look Behind Over each Shoulder: Looks behind from both sides and weight shifts well Turn 360 Degrees: Able to turn 360 degrees safely in 4 seconds or less Standing Unsupported, Alternately Place Feet on Step/Stool: Able to stand independently and complete 8 steps >20 seconds Standing Unsupported, One Foot in Front: Able to place foot tandem independently and hold 30 seconds Standing on One Leg: Able to lift leg independently and hold 5-10 seconds Total Score: 52   End of Session OT - End of Session Activity Tolerance: Patient tolerated treatment well Patient left: in bed;with family/visitor present     [place] OTR/L Pager number [phone]  10/05/2011, 3:33 PM

## 2011-10-05 NOTE — Progress Notes (Signed)
Lexiscan MV performed.  Suanne Marker Aily Tzeng 10/05/2011 10:05 AM

## 2011-10-06 ENCOUNTER — Inpatient Hospital Stay (HOSPITAL_COMMUNITY): Payer: Medicare Other

## 2011-10-06 DIAGNOSIS — R0789 Other chest pain: Secondary | ICD-10-CM

## 2011-10-06 DIAGNOSIS — D649 Anemia, unspecified: Secondary | ICD-10-CM

## 2011-10-06 LAB — GLUCOSE, CAPILLARY
Glucose-Capillary: 102 mg/dL — ABNORMAL HIGH (ref 70–99)
Glucose-Capillary: 231 mg/dL — ABNORMAL HIGH (ref 70–99)

## 2011-10-06 MED ORDER — PROMETHAZINE HCL 25 MG PO TABS: 25.0000 mg | ORAL_TABLET | Freq: Four times a day (QID) | ORAL | Status: DC | PRN

## 2011-10-06 MED ORDER — TOPIRAMATE 25 MG PO TABS: 25.0000 mg | ORAL_TABLET | Freq: Every day | ORAL | Status: DC

## 2011-10-06 MED ORDER — ONDANSETRON HCL 4 MG PO TABS: 4.0000 mg | ORAL_TABLET | Freq: Three times a day (TID) | ORAL | Status: AC | PRN

## 2011-10-06 NOTE — Progress Notes (Addendum)
Subjective: Patient with only mild headache today.  No nausea or vomiting.  B12 and Vit D level are pending.  Patient has been started on Topamax for headache prophylaxis and is tolerating this well but prefers not to be on prophylaxis until a diagnosis is made as to exactly what is causing her headaches.    Objective: Current vital signs: BP 120/76  Pulse 67  Temp 98.7 F (37.1 C) (Oral)  Resp 18  Ht 5\' 2"  (1.575 m)  Wt 81.647 kg (180 lb)  BMI 32.92 kg/m2  SpO2 97% Vital signs in last 24 hours: Temp:  [97.6 F (36.4 C)-98.7 F (37.1 C)] 98.7 F (37.1 C) (07/19 0532) Pulse Rate:  [64-107] 67  (07/19 0532) Resp:  [18] 18  (07/19 0532) BP: (110-150)/(65-76) 120/76 mmHg (07/19 0532) SpO2:  [96 %-97 %] 97 % (07/19 0532)  Intake/Output from previous day: 07/18 0701 - 07/19 0700 In: 720 [P.O.:720] Out: -  Intake/Output this shift:   Nutritional status: Carb Control  Neurologic Exam: Mental Status:  Alert, oriented, thought content appropriate. Speech fluent without evidence of aphasia. Able to follow 3 step commands without difficulty.   Cranial Nerves:    II:  Visual fields grossly intact.   III/IV/VI:  Extraocular movements intact. Pupils reactive bilaterally.   V/VII:  Smile symmetric. facial light touch sensation normal bilaterally.   VIII:  Grossly intact.   IX/X:  Normal gag.   XI:  Bilateral shoulder shrug normal.   XII:  Midline tongue extension normal.   Motor:  5/5 bilaterally with normal tone and bulk   Sensory:  Pinprick and light touch intact throughout, bilaterally   DTRs:  2+ in the upper extremities and absent in the lower extremities   Plantars:  Downgoing bilaterally   Cerebellar:  Normal finger-to-nose and normal heel-to-shin test.       Lab Results: Results for orders placed during the hospital encounter of 10/04/11 (from the past 48 hour(s))  CBC WITH DIFFERENTIAL     Status: Abnormal   Collection Time   10/04/11  2:33 PM      Component Value  Range Comment   WBC 12.0 (*) 4.0 - 10.5 K/uL    RBC 4.38  3.87 - 5.11 MIL/uL    Hemoglobin 13.1  12.0 - 15.0 g/dL    HCT 38.5  36.0 - 46.0 %    MCV 87.9  78.0 - 100.0 fL    MCH 29.9  26.0 - 34.0 pg    MCHC 34.0  30.0 - 36.0 g/dL    RDW 14.4  11.5 - 15.5 %    Platelets 236  150 - 400 K/uL    Neutrophils Relative 86 (*) 43 - 77 %    Neutro Abs 10.3 (*) 1.7 - 7.7 K/uL    Lymphocytes Relative 10 (*) 12 - 46 %    Lymphs Abs 1.2  0.7 - 4.0 K/uL    Monocytes Relative 4  3 - 12 %    Monocytes Absolute 0.4  0.1 - 1.0 K/uL    Eosinophils Relative 0  0 - 5 %    Eosinophils Absolute 0.1  0.0 - 0.7 K/uL    Basophils Relative 0  0 - 1 %    Basophils Absolute 0.0  0.0 - 0.1 K/uL   BASIC METABOLIC PANEL     Status: Abnormal   Collection Time   10/04/11  2:33 PM      Component Value Range Comment   Sodium 138  135 - 145 mEq/L    Potassium 3.8  3.5 - 5.1 mEq/L    Chloride 100  96 - 112 mEq/L    CO2 23  19 - 32 mEq/L    Glucose, Bld 135 (*) 70 - 99 mg/dL    BUN 28 (*) 6 - 23 mg/dL    Creatinine, Ser 0.89  0.50 - 1.10 mg/dL    Calcium 10.3  8.4 - 10.5 mg/dL    GFR calc non Af Amer 65 (*) >90 mL/min    GFR calc Af Amer 75 (*) >90 mL/min   PROTIME-INR     Status: Normal   Collection Time   10/04/11  2:33 PM      Component Value Range Comment   Prothrombin Time 11.7  11.6 - 15.2 seconds    INR 0.84  0.00 - 1.49   APTT     Status: Abnormal   Collection Time   10/04/11  2:33 PM      Component Value Range Comment   aPTT 22 (*) 24 - 37 seconds   SEDIMENTATION RATE     Status: Abnormal   Collection Time   10/04/11  2:33 PM      Component Value Range Comment   Sed Rate 25 (*) 0 - 22 mm/hr   TROPONIN I     Status: Normal   Collection Time   10/04/11  2:34 PM      Component Value Range Comment   Troponin I <0.30  <0.30 ng/mL   CBC     Status: Normal   Collection Time   10/04/11  6:23 PM      Component Value Range Comment   WBC 8.4  4.0 - 10.5 K/uL    RBC 4.18  3.87 - 5.11 MIL/uL    Hemoglobin  12.4  12.0 - 15.0 g/dL    HCT 36.5  36.0 - 46.0 %    MCV 87.3  78.0 - 100.0 fL    MCH 29.7  26.0 - 34.0 pg    MCHC 34.0  30.0 - 36.0 g/dL    RDW 14.4  11.5 - 15.5 %    Platelets 305  150 - 400 K/uL   CREATININE, SERUM     Status: Abnormal   Collection Time   10/04/11  6:23 PM      Component Value Range Comment   Creatinine, Ser 0.80  0.50 - 1.10 mg/dL    GFR calc non Af Amer 74 (*) >90 mL/min    GFR calc Af Amer 85 (*) >90 mL/min   TSH     Status: Abnormal   Collection Time   10/04/11  6:23 PM      Component Value Range Comment   TSH 0.218 (*) 0.350 - 4.500 uIU/mL   HEMOGLOBIN A1C     Status: Abnormal   Collection Time   10/04/11  6:23 PM      Component Value Range Comment   Hemoglobin A1C 6.1 (*) <5.7 %    Mean Plasma Glucose 128 (*) <117 mg/dL   CARDIAC PANEL(CRET KIN+CKTOT+MB+TROPI)     Status: Abnormal   Collection Time   10/04/11  6:24 PM      Component Value Range Comment   Total CK 107  7 - 177 U/L    CK, MB 2.9  0.3 - 4.0 ng/mL    Troponin I <0.30  <0.30 ng/mL    Relative Index 2.7 (*) 0.0 - 2.5   PRO B NATRIURETIC PEPTIDE  Status: Abnormal   Collection Time   10/04/11  6:24 PM      Component Value Range Comment   Pro B Natriuretic peptide (BNP) 153.8 (*) 0 - 125 pg/mL   GLUCOSE, CAPILLARY     Status: Abnormal   Collection Time   10/04/11  9:40 PM      Component Value Range Comment   Glucose-Capillary 203 (*) 70 - 99 mg/dL    Comment 1 Notify RN     CARDIAC PANEL(CRET KIN+CKTOT+MB+TROPI)     Status: Normal   Collection Time   10/05/11 12:50 AM      Component Value Range Comment   Total CK 93  7 - 177 U/L    CK, MB 2.9  0.3 - 4.0 ng/mL    Troponin I <0.30  <0.30 ng/mL    Relative Index RELATIVE INDEX IS INVALID  0.0 - 2.5   BASIC METABOLIC PANEL     Status: Abnormal   Collection Time   10/05/11  5:30 AM      Component Value Range Comment   Sodium 142  135 - 145 mEq/L    Potassium 3.9  3.5 - 5.1 mEq/L    Chloride 103  96 - 112 mEq/L    CO2 24  19 - 32 mEq/L     Glucose, Bld 61 (*) 70 - 99 mg/dL    BUN 22  6 - 23 mg/dL    Creatinine, Ser 0.91  0.50 - 1.10 mg/dL    Calcium 10.1  8.4 - 10.5 mg/dL    GFR calc non Af Amer 63 (*) >90 mL/min    GFR calc Af Amer 73 (*) >90 mL/min   CBC     Status: Abnormal   Collection Time   10/05/11  5:30 AM      Component Value Range Comment   WBC 5.6  4.0 - 10.5 K/uL    RBC 3.95  3.87 - 5.11 MIL/uL    Hemoglobin 11.6 (*) 12.0 - 15.0 g/dL    HCT 34.6 (*) 36.0 - 46.0 %    MCV 87.6  78.0 - 100.0 fL    MCH 29.4  26.0 - 34.0 pg    MCHC 33.5  30.0 - 36.0 g/dL    RDW 14.4  11.5 - 15.5 %    Platelets 240  150 - 400 K/uL   LIPID PANEL     Status: Normal   Collection Time   10/05/11  5:30 AM      Component Value Range Comment   Cholesterol 141  0 - 200 mg/dL    Triglycerides 137  <150 mg/dL    HDL 49  >39 mg/dL    Total CHOL/HDL Ratio 2.9      VLDL 27  0 - 40 mg/dL    LDL Cholesterol 65  0 - 99 mg/dL   GLUCOSE, CAPILLARY     Status: Abnormal   Collection Time   10/05/11  5:55 AM      Component Value Range Comment   Glucose-Capillary 58 (*) 70 - 99 mg/dL    Comment 1 Notify RN     GLUCOSE, CAPILLARY     Status: Abnormal   Collection Time   10/05/11  6:35 AM      Component Value Range Comment   Glucose-Capillary 138 (*) 70 - 99 mg/dL   GLUCOSE, CAPILLARY     Status: Abnormal   Collection Time   10/05/11 11:16 AM      Component Value  Range Comment   Glucose-Capillary 182 (*) 70 - 99 mg/dL   CARDIAC PANEL(CRET KIN+CKTOT+MB+TROPI)     Status: Normal   Collection Time   10/05/11 11:17 AM      Component Value Range Comment   Total CK 96  7 - 177 U/L    CK, MB 2.6  0.3 - 4.0 ng/mL    Troponin I <0.30  <0.30 ng/mL    Relative Index RELATIVE INDEX IS INVALID  0.0 - 2.5   T4, FREE     Status: Normal   Collection Time   10/05/11 11:17 AM      Component Value Range Comment   Free T4 1.58  0.80 - 1.80 ng/dL   T3     Status: Normal   Collection Time   10/05/11 11:17 AM      Component Value Range Comment   T3,  Total 83.0  80.0 - 204.0 ng/dl   OCCULT BLOOD X 1 CARD TO LAB, STOOL     Status: Normal   Collection Time   10/05/11  1:57 PM      Component Value Range Comment   Fecal Occult Bld NEGATIVE     GLUCOSE, CAPILLARY     Status: Abnormal   Collection Time   10/05/11  4:33 PM      Component Value Range Comment   Glucose-Capillary 239 (*) 70 - 99 mg/dL    Comment 1 Documented in Chart      Comment 2 Notify RN     GLUCOSE, CAPILLARY     Status: Abnormal   Collection Time   10/05/11  9:45 PM      Component Value Range Comment   Glucose-Capillary 141 (*) 70 - 99 mg/dL    Comment 1 Documented in Chart      Comment 2 Notify RN     GLUCOSE, CAPILLARY     Status: Abnormal   Collection Time   10/06/11  5:48 AM      Component Value Range Comment   Glucose-Capillary 102 (*) 70 - 99 mg/dL    Comment 1 Documented in Chart      Comment 2 Notify RN       No results found for this or any previous visit (from the past 240 hour(s)).  Lipid Panel  Basename 10/05/11 0530  CHOL 141  TRIG 137  HDL 49  CHOLHDL 2.9  VLDL 27  LDLCALC 65    Studies/Results: Nm Myocar Multi W/spect W/wall Motion / Ef  10/05/2011  Lexiscan Myovue:  Indication: Chest Pain  The patient received .4 mg of Lexiscan as a bolus.  Baseline ECG showed RBBB No changes with injection.  HR stable at 97 bpm, BP stable at 146/72 mmHg No symptoms  Resting and stress images were normal No ischemia or infarction Gated images normal with no RWMA;s EF 68%  Impression: Normal lexiscan myovue EF 68%  [name] MD [facility]  Original Report Authenticated By: [place]   Dg Chest Portable 1 View  10/04/2011  *RADIOLOGY REPORT*  Clinical Data: Chest pain, headache  PORTABLE CHEST - 1 VIEW  Comparison: Chest x-ray of 03/23/2009 and CT chest of 09/09/2010  Findings: The lungs are poorly aerated with basilar volume loss. Cardiomegaly is stable.  There are degenerative changes throughout the thoracic spine.  Calcification in the left lower neck probably  is within the thyroid gland.  IMPRESSION: Poor inspiration chest x-ray with basilar volume loss.  No definite active process.  Stable cardiomegaly.  Original Report  Authenticated By: Joretta Bachelor, M.D.    Medications:  I have reviewed the patient's current medications. Scheduled:   . amLODipine  10 mg Oral Daily  . aspirin EC  81 mg Oral Daily  . atorvastatin  40 mg Oral q1800  . enoxaparin (LOVENOX) injection  40 mg Subcutaneous Q24H  . ezetimibe  10 mg Oral Daily  . hydrochlorothiazide  12.5 mg Oral Daily  . insulin aspart  0-15 Units Subcutaneous TID WC  . lisinopril  20 mg Oral Daily  . pantoprazole  40 mg Oral Q1200  . polyethylene glycol  17 g Oral Daily  . regadenoson  0.4 mg Intravenous Once  . sodium chloride  3 mL Intravenous Q12H  . topiramate  25 mg Oral QHS  . DISCONTD: heparin  5,000 Units Subcutaneous Q8H  . DISCONTD: nitroGLYCERIN  0.5 inch Topical Q8H  . DISCONTD: sodium chloride  3 mL Intravenous Q12H    Assessment/Plan:  Patient Active Hospital Problem List:  Chronic headaches ()   Assessment: Malignant causes for headache have been ruled out for the most part.   Patient is stable.  Does not wish to have prophylaxis at this time.  Patient just wants something for nausea and vomiting.    Plan:  1.  MRA of the brain.  If unremarkable no further neurologic intervention is recommended at this time.  If further questions arise, please call or page at that time.  Thank you for allowing neurology to participate in the care of this patient. Labs may be followed up as an outpatient.        LOS: 2 days   Alexis Goodell, MD [facility] [phone] 10/06/2011  9:36 AM   Addendum: MRA unremarkable.  Agree with D/C.  Alexis Goodell, MD [facility] [phone]

## 2011-10-06 NOTE — Progress Notes (Signed)
Reviewed MRA report and notified pt of results. DC home, close follow up with PCP and neurologist recommended.  Pt verbalized understanding.  Murvin Natal, MD

## 2011-10-06 NOTE — Discharge Summary (Signed)
HOSPITAL DISCHARGE SUMMARY   @n   Karen West, 70 y.o., DOB [birth year]  Admission date: 10/04/2011 Discharge Date: 10/06/2011  Primary MD Horatio Pel, MD  Admitting Physician Murlean Iba, MD  Admission Diagnosis   Esophageal reflux [530.81] Headache [784.0] Obstructive sleep apnea (adult) (pediatric) [327.23] Osteoarthritis [715.90] GERD (gastroesophageal reflux disease) [530.81] Hypertension [401.9] Angina at rest [413.9] FH: MI (myocardial infarction) [V17.3] Chest pain at rest [786.50] Chronic headaches [784.0] OSA on CPAP [327.23] Chest pain [786.50] Other and unspecified hyperlipidemia [272.4]  Recommendations for PCP to follow up:  Please follow up on B12 and Vit D levels still pending at discharge.  Please get pt back to see her neurologist regarding the chronic headaches.   Please take a look at her meds to see if they could be contributing to her worsening headaches.    Discharge Diagnoses:   Active Hospital Problems   Diagnosis Date Noted  . Chest pain at rest 10/04/2011  . Hyperthyroidism 10/05/2011  . Angina at rest 10/04/2011  . Headache 10/04/2011  . FH: MI (myocardial infarction) 10/04/2011  . Chest pressure 10/04/2011  . Hyperlipidemia   . Breast cancer   . Chronic anemia   . Type II or unspecified type diabetes mellitus without mention of complication, uncontrolled   . Chronic headaches   . Right bundle branch block   . OBESITY 11/17/2009  . OBSTRUCTIVE SLEEP APNEA 01/05/2009  . DIABETES, TYPE 2 11/12/2008  . HYPERLIPIDEMIA 11/12/2008  . HYPERTENSION 11/12/2008  . G E R D 11/12/2008  . OSTEOARTHRITIS 11/12/2008    Resolved Hospital Problems   Diagnosis Date Noted Date Resolved  No resolved problems to display.    Past Medical History  Diagnosis Date  . Hyperlipidemia   . Hypertension   . Chronic anemia   . GERD (gastroesophageal reflux disease)   . Obesity   . Osteoarthritis   . Right bundle branch block   .  Atherosclerosis of aorta   . Multinodular goiter   . Type II or unspecified type diabetes mellitus without mention of complication, uncontrolled   . Carotid artery disease     [id number] w/ 0000000 LICA stenosis and Q000111Q RICA stenosis  . OSA on CPAP   . Chronic headaches   . Breast cancer     "cancer in both breasts; s/p left mastectomy    Past Surgical History  Procedure Date  . Tubal ligation 1968  . Mastectomy 2000    left  . Breast biopsy 1993; 1995; 2000    left; right; left    Consults  [facility]  Significant Tests:  See full reports for all details   Preliminary report: Right - No evidence of significant ICA stenosis. Left - 40-59% internal carotid artery stenosis.  Vertebral artery flow is antegrade bilaterally  No significant change since study completed at [place] 07/07/11.  ECHO LV EF: 55% - 60%  ------------------------------------------------------------ Indications: Chest pain 786.51.  ------------------------------------------------------------ History: PMH: s/p left mastectomy. Risk factors: Hypertension. Diabetes mellitus. Obese. Dyslipidemia.  ------------------------------------------------------------ Study Conclusions  - Left ventricle: The cavity size was normal. Wall thickness was normal. Systolic function was normal. The estimated ejection fraction was in the range of 55% to 60%. Wall motion was normal; there were no regional wall motion abnormalities. - Aortic valve: Trivial regurgitation. Valve area: 1.89cm^2(VTI). Valve area: 1.39cm^2 (Vmax). - Mitral valve: Calcified annulus.   STRESS MYOVIEW The patient received .4 mg of Lexiscan as a bolus. Baseline ECG  showed RBBB  No changes with  injection. HR stable at 97 bpm, BP stable at  146/72 mmHg No symptoms  Resting and stress images were normal No ischemia or infarction  Gated images normal with no RWMA;s EF 68%  Impression: Normal lexiscan myovue EF 68%  [name] MD  [facility] See H&P, Labs, Consult and Test reports for all details. From H&P:  Karen West is an 71 y.o. female with hypertension, hyperlipidemia, diabetes mellitus and carotid vascular disease presented to the emergency department today complaining of sudden onset of severe headache pain. She also complained of chest pain that started around 11 this morning. She was brought to the emergency department by EMS. They gave her nitroglycerin and the chest pain resolved. The patient suffers from chronic headaches and occasionally reports severe headaches. She reports that she was discharged from [facility] 2 days ago from a hospitalization for chest pain. The patient says she has never experienced chest pain until 4 days ago. She was sitting down eating breakfast and drinking coffee and experienced anterior left-sided chest wall pain with no radiation. She was admitted into the hospital and ruled out for myocardial infarction. She was scheduled to have an outpatient stress test in 4 days. The patient reports that her mother died of myocardial infarction at the age of 69. She also has a family history significant for multiple strokes and vascular disease. The patient says that she has acid reflux disease obstructive sleep apnea and controlled type 2 diabetes mellitus. Because she has reported recurrence of chest pain there is concern for unstable angina and coronary artery disease. The patient reports that she has a 60-70% blockage in one of her carotid arteries that was reported 3 months ago. Hospitalization was requested for cardiology consultation, rule out myocardial infarction and monitoring.   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  See neuro consult note.    Carotid vascular Disease  Doppler study as above, no significant changes from previous study done in April 2013.   Subclinical Hyperthyroidism  Pt advised to follow up with her private endocrinologist to readdress the issue as this may be contributing to her worsening headaches, her Free T4 levels are normal but TSH is 0.2.  Pt reports that she has had multiple thyroid biopsies, but declined to have radioactive iodine treatments.      Active Hospital Problems   Diagnosis Date Noted  . Chest pain at rest 10/04/2011  . Hyperthyroidism 10/05/2011  . Angina at rest 10/04/2011  . Headache 10/04/2011  . FH: MI (myocardial infarction) 10/04/2011  . Chest pressure 10/04/2011  . Hyperlipidemia   . Breast cancer   . Chronic anemia   . Type II or unspecified type diabetes mellitus without mention of complication, uncontrolled   . Chronic headaches   . Right bundle branch block   . OBESITY 11/17/2009  . OBSTRUCTIVE SLEEP APNEA 01/05/2009  . DIABETES, TYPE 2 11/12/2008  . HYPERLIPIDEMIA 11/12/2008  . HYPERTENSION 11/12/2008  . G E R D 11/12/2008  .  OSTEOARTHRITIS 11/12/2008    Resolved Hospital Problems   Diagnosis Date Noted Date Resolved  No resolved problems to display.    Today's Assessment:   Subjective:   Karen West  Pt says she feels much better today.  Headache is nearly completely resolved now.  No nausea or vomiting.  No further CP.  No SOB.    Objective:   Blood pressure 120/76, pulse 67, temperature 98.7 F (37.1 C), temperature source Oral, resp. rate 18, height 5\' 2"  (1.575 m), weight 81.647 kg (180 lb), SpO2 97.00%.  Intake/Output Summary (Last 24 hours) at 10/06/11 0856 Last data filed at 10/05/11 2149  Gross  per 24 hour  Intake    720 ml  Output      0 ml  Net    720 ml    Exam General - awake, no distress, cooperative  HEENT - NCAT, MMM Lungs - BBS, CTA  CV - normal s1, s2 sounds  Abd - soft, nondistended, no masses, nontender  Ext - no C/C/E Neuro - nonfocal    Lab Results  Component Value Date   WBC 5.6 10/05/2011   WBC 6.7 06/20/2011   HGB 11.6* 10/05/2011   HGB 11.5* 06/20/2011   HCT 34.6* 10/05/2011   HCT 34.3* 06/20/2011   PLT 240 10/05/2011   PLT 262 06/20/2011   LYMPHOPCT 10* 10/04/2011   LYMPHOPCT 24.2 06/20/2011   MONOPCT 4 10/04/2011   MONOPCT 9.4 06/20/2011   EOSPCT 0 10/04/2011   EOSPCT 2.9 06/20/2011   BASOPCT 0 10/04/2011   BASOPCT 0.5 06/20/2011   CMP: Lab Results  Component Value Date   NA 142 10/05/2011   K 3.9 10/05/2011   CL 103 10/05/2011   CO2 24 10/05/2011   BUN 22 10/05/2011   CREATININE 0.91 10/05/2011   PROT 7.1 06/20/2011   ALBUMIN 4.2 06/20/2011   BILITOT 0.3 06/20/2011   ALKPHOS 74 06/20/2011   AST 23 06/20/2011   ALT 19 06/20/2011  . Discharge Instructions    Please follow up with neurologist in 2 weeks Please see your PCP in 1 week for hospital follow up. Please check with your pcp about reviewing your medications  Return if symptoms don't improve, return, worsen or new problems develop.  DISCHARGE MEDICATION: Medication List  As of 10/06/2011  8:56 AM   STOP taking these medications         aspirin 81 MG chewable tablet         TAKE these medications         amLODipine 10 MG tablet   Commonly known as: NORVASC   Take 10 mg by mouth daily.      aspirin 81 MG tablet   Take 81 mg by mouth daily.      CALCIUM 600 PO   Take 1 tablet by mouth 2 (two) times daily.      ezetimibe 10 MG tablet   Commonly known as: ZETIA   Take 10 mg by mouth daily.      Fish Oil 1200 MG Caps   Take 1 capsule by mouth daily.      glipiZIDE 10 MG 24 hr tablet   Commonly known as: GLUCOTROL XL   Take 10 mg by mouth 2 (two) times daily.      KOMBIGLYZE XR 2.07-998 MG Tb24    Generic drug: Saxagliptin-Metformin   Take 1 tablet by mouth 2 (two) times daily.      lisinopril-hydrochlorothiazide 20-12.5 MG per tablet   Commonly known  as: PRINZIDE,ZESTORETIC   Take 1 tablet by mouth daily.      naproxen sodium 220 MG tablet   Commonly known as: ANAPROX   Take 440 mg by mouth 2 (two) times daily as needed. For pain      nitroGLYCERIN 0.4 MG SL tablet   Commonly known as: NITROSTAT   Place 0.4 mg under the tongue every 5 (five) minutes as needed. For chest pain      omeprazole 20 MG capsule   Commonly known as: PRILOSEC   Take 20 mg by mouth daily.      rosuvastatin 20 MG tablet   Commonly known as: CRESTOR   Take 20 mg by mouth daily.      topiramate 25 MG tablet   Commonly known as: TOPAMAX   Take 1 tablet (25 mg total) by mouth at bedtime.          Disposition and Follow-up:  Discharge Orders    Future Orders Please Complete By Expires   Diet - low sodium heart healthy      Increase activity slowly      Discharge instructions      Comments:   Please follow up with neurologist in 2 weeks Please see your PCP in 1 week for hospital follow up. Please check with your pcp about reviewing your medications  Return if symptoms don't improve, return, worsen or new problems develop.     Follow-up Information    Follow up with Horatio Pel, MD. Schedule an appointment as soon as possible for a visit in 1 week. (hospital follow up)    Contact information:   [address] [place] [phone]       Follow up with Jacelyn Pi, MD. Schedule an appointment as soon as possible for a visit in 1 week. (follow up hyperthroidism)    Contact information:   [address] [place] [phone]       Follow up with See your Neurologist. Schedule an appointment as soon as possible for a visit in 2 weeks. (Follow Up Headaches ASAP)        The risks, benefits, and  possible side effects of all treatments and tests were explained to the patient.  The patient verbalized understanding.  The importance of close follow up with the primary care medical provider was explained clearly to the patient.  The patient verbalized understanding.  The patient was given instructions to return if symptoms recur, worsen or new changes develop.  The patient verbalized understanding.   Murlean Iba, MD, CDE, FAAFP [facility] [place], [place]  Pager [phone]  Total Time spent reviewing critical documents, reviewing this patient's comprehensive hospitalization, arranging follow up and coordination of care, reviewing data and todays exam greater than 45 minutes.   Signed: Clanford Wynetta Emery 10/06/2011 8:56 AM

## 2011-10-06 NOTE — Progress Notes (Signed)
PT Cancellation Note  Treatment cancelled today due to pt OOF at MRI.  Will try another time as able.    Karen West, [place] 10/06/2011, 10:41 AM

## 2011-10-10 LAB — VITAMIN D 1,25 DIHYDROXY
Vitamin D 1, 25 (OH)2 Total: 47 pg/mL (ref 18–72)
Vitamin D2 1, 25 (OH)2: <8 pg/mL
Vitamin D3 1, 25 (OH)2: 47 pg/mL

## 2011-10-19 DIAGNOSIS — R0789 Other chest pain: Secondary | ICD-10-CM | POA: Diagnosis not present

## 2011-10-24 DIAGNOSIS — E0500 Thyrotoxicosis with diffuse goiter without thyrotoxic crisis or storm: Secondary | ICD-10-CM | POA: Diagnosis not present

## 2011-10-24 DIAGNOSIS — IMO0001 Reserved for inherently not codable concepts without codable children: Secondary | ICD-10-CM | POA: Diagnosis not present

## 2011-10-24 DIAGNOSIS — I10 Essential (primary) hypertension: Secondary | ICD-10-CM | POA: Diagnosis not present

## 2011-11-10 DIAGNOSIS — IMO0002 Reserved for concepts with insufficient information to code with codable children: Secondary | ICD-10-CM | POA: Diagnosis not present

## 2011-11-10 DIAGNOSIS — M1710 Unilateral primary osteoarthritis, unspecified knee: Secondary | ICD-10-CM | POA: Diagnosis not present

## 2011-11-13 DIAGNOSIS — L509 Urticaria, unspecified: Secondary | ICD-10-CM | POA: Diagnosis not present

## 2011-11-13 DIAGNOSIS — L299 Pruritus, unspecified: Secondary | ICD-10-CM | POA: Diagnosis not present

## 2011-11-16 DIAGNOSIS — K219 Gastro-esophageal reflux disease without esophagitis: Secondary | ICD-10-CM | POA: Diagnosis not present

## 2011-11-16 DIAGNOSIS — L509 Urticaria, unspecified: Secondary | ICD-10-CM | POA: Diagnosis not present

## 2012-01-02 DIAGNOSIS — E0500 Thyrotoxicosis with diffuse goiter without thyrotoxic crisis or storm: Secondary | ICD-10-CM | POA: Diagnosis not present

## 2012-01-02 DIAGNOSIS — E0590 Thyrotoxicosis, unspecified without thyrotoxic crisis or storm: Secondary | ICD-10-CM | POA: Diagnosis not present

## 2012-01-02 DIAGNOSIS — E7800 Pure hypercholesterolemia, unspecified: Secondary | ICD-10-CM | POA: Diagnosis not present

## 2012-01-04 ENCOUNTER — Ambulatory Visit: Payer: Self-pay | Admitting: Obstetrics and Gynecology

## 2012-01-04 DIAGNOSIS — I10 Essential (primary) hypertension: Secondary | ICD-10-CM | POA: Diagnosis not present

## 2012-01-04 DIAGNOSIS — E7800 Pure hypercholesterolemia, unspecified: Secondary | ICD-10-CM | POA: Diagnosis not present

## 2012-01-04 DIAGNOSIS — L608 Other nail disorders: Secondary | ICD-10-CM | POA: Diagnosis not present

## 2012-01-04 DIAGNOSIS — E119 Type 2 diabetes mellitus without complications: Secondary | ICD-10-CM | POA: Diagnosis not present

## 2012-01-04 DIAGNOSIS — E0500 Thyrotoxicosis with diffuse goiter without thyrotoxic crisis or storm: Secondary | ICD-10-CM | POA: Diagnosis not present

## 2012-01-04 DIAGNOSIS — E1159 Type 2 diabetes mellitus with other circulatory complications: Secondary | ICD-10-CM | POA: Diagnosis not present

## 2012-01-04 DIAGNOSIS — I739 Peripheral vascular disease, unspecified: Secondary | ICD-10-CM | POA: Diagnosis not present

## 2012-01-22 DIAGNOSIS — M25569 Pain in unspecified knee: Secondary | ICD-10-CM | POA: Diagnosis not present

## 2012-01-25 ENCOUNTER — Encounter: Payer: Self-pay | Admitting: Obstetrics and Gynecology

## 2012-01-25 ENCOUNTER — Ambulatory Visit (INDEPENDENT_AMBULATORY_CARE_PROVIDER_SITE_OTHER): Payer: Medicare Other | Admitting: Obstetrics and Gynecology

## 2012-01-25 VITALS — BP 118/64 | HR 68 | Resp 14 | Ht 62.0 in | Wt 177.0 lb

## 2012-01-25 DIAGNOSIS — Z20828 Contact with and (suspected) exposure to other viral communicable diseases: Secondary | ICD-10-CM

## 2012-01-25 DIAGNOSIS — Z2089 Contact with and (suspected) exposure to other communicable diseases: Secondary | ICD-10-CM | POA: Diagnosis not present

## 2012-01-25 DIAGNOSIS — Z124 Encounter for screening for malignant neoplasm of cervix: Secondary | ICD-10-CM | POA: Diagnosis not present

## 2012-01-25 DIAGNOSIS — Z719 Counseling, unspecified: Secondary | ICD-10-CM

## 2012-01-25 DIAGNOSIS — R21 Rash and other nonspecific skin eruption: Secondary | ICD-10-CM

## 2012-01-25 NOTE — Progress Notes (Signed)
Annual:  Last Pap: 01/02/2009 WNL: Yes Regular Periods:no Contraception: BTL  Monthly Breast exam:no Tetanus<43yrs:yes Nl.Bladder Function:yes Daily BMs:no 1-2 times a week per pt  Healthy Diet:no Calcium:yes Mammogram:yes Date of Mammogram: 05/16/2011 Exercise:yes Have often Exercise: Once a week Seatbelt: yes Abuse at home: no Stressful work:no Sigmoid-colonoscopy: 2012 "WNL" Polyp Removed per pt Bone Density: Yes 2010  PCP: Dr.Walter Pharr Change in PMH: No Changes Change in FMH:No Changes  Subjective:    Karen West is a 71 y.o. female No obstetric history on file. who presents for annual exam.  Has had at least 3 episodes of eruptions over entire body, the first time accompanied by severe itching and treated with Prednisone dose pack.  Other episodes were not as itchy and resolved spontaneously.  The following portions of the patient's history were reviewed and updated as appropriate: allergies, current medications, past family history, past medical history, past social history, past surgical history and problem list.  Review of Systems Pertinent items are noted in HPI. Gastrointestinal:No change in bowel habits, no abdominal pain, no rectal bleeding Genitourinary:negative for dysuria, frequency, hematuria, nocturia and urinary incontinence    Objective:     BP 118/64  Ht 5\' 2"  (1.575 m)  Wt 177 lb (80.287 kg)  BMI 32.37 kg/m2  Weight:  Wt Readings from Last 1 Encounters:  01/25/12 177 lb (80.287 kg)     BMI: Body mass index is 32.37 kg/(m^2). General Appearance: Alert, appropriate appearance for age. No acute distress HEENT: Grossly normal Neck / Thyroid: Supple, no masses, nodes or enlargement Lungs: clear to auscultation bilaterally Back: No CVA tenderness Breast Exam: No masses or nodes.No dimpling, nipple retraction or discharge. Cardiovascular: Regular rate and rhythm. S1, S2, no murmur Gastrointestinal: Soft, non-tender, no masses or  organomegaly Pelvic Exam: External genitalia: normal general appearance Vaginal: atrophic mucosa Cervix: normal appearance Adnexa: non palpable Uterus: No enlargement noted Exam limited by body habitus Rectovaginal: normal rectal, no masses Lymphatic Exam: Non-palpable nodes in neck, clavicular, axillary, or inguinal regions Skin: no rash or abnormalities Neurologic: Normal gait and speech, no tremor  Psychiatric: Alert and oriented, appropriate affect.    Urinalysis:Not done    Assessment:    Menopause New rash, resolved    Plan:   mammogram return annually or prn Derm referral Pt requested HIV, RPR, GC, CHL testing but did not return for blood draw    Kendall Flack MD

## 2012-01-29 ENCOUNTER — Other Ambulatory Visit: Payer: Self-pay

## 2012-01-29 ENCOUNTER — Telehealth: Payer: Self-pay

## 2012-01-29 DIAGNOSIS — Z20828 Contact with and (suspected) exposure to other viral communicable diseases: Secondary | ICD-10-CM

## 2012-01-29 NOTE — Telephone Encounter (Signed)
Tc to pt per referral appt. Appt sched 07/17/11 @10 :00 with Dr. Ronnald Ramp. Records faxed to [facility] # [phone]. Pt agrees.

## 2012-02-06 ENCOUNTER — Other Ambulatory Visit: Payer: Medicare Other

## 2012-02-06 DIAGNOSIS — Z20828 Contact with and (suspected) exposure to other viral communicable diseases: Secondary | ICD-10-CM | POA: Diagnosis not present

## 2012-02-06 DIAGNOSIS — E1129 Type 2 diabetes mellitus with other diabetic kidney complication: Secondary | ICD-10-CM | POA: Diagnosis not present

## 2012-02-06 DIAGNOSIS — I10 Essential (primary) hypertension: Secondary | ICD-10-CM | POA: Diagnosis not present

## 2012-02-06 DIAGNOSIS — R21 Rash and other nonspecific skin eruption: Secondary | ICD-10-CM | POA: Diagnosis not present

## 2012-02-06 DIAGNOSIS — K219 Gastro-esophageal reflux disease without esophagitis: Secondary | ICD-10-CM | POA: Diagnosis not present

## 2012-02-06 LAB — RPR

## 2012-02-06 LAB — HIV ANTIBODY (ROUTINE TESTING W REFLEX): HIV: NONREACTIVE

## 2012-02-07 DIAGNOSIS — E119 Type 2 diabetes mellitus without complications: Secondary | ICD-10-CM | POA: Diagnosis not present

## 2012-02-07 DIAGNOSIS — H2510 Age-related nuclear cataract, unspecified eye: Secondary | ICD-10-CM | POA: Diagnosis not present

## 2012-02-20 DIAGNOSIS — G4730 Sleep apnea, unspecified: Secondary | ICD-10-CM | POA: Diagnosis not present

## 2012-03-06 DIAGNOSIS — I10 Essential (primary) hypertension: Secondary | ICD-10-CM | POA: Diagnosis not present

## 2012-04-02 DIAGNOSIS — E0500 Thyrotoxicosis with diffuse goiter without thyrotoxic crisis or storm: Secondary | ICD-10-CM | POA: Diagnosis not present

## 2012-04-02 DIAGNOSIS — E119 Type 2 diabetes mellitus without complications: Secondary | ICD-10-CM | POA: Diagnosis not present

## 2012-04-02 DIAGNOSIS — E7800 Pure hypercholesterolemia, unspecified: Secondary | ICD-10-CM | POA: Diagnosis not present

## 2012-04-08 ENCOUNTER — Other Ambulatory Visit: Payer: Self-pay | Admitting: Obstetrics and Gynecology

## 2012-04-08 DIAGNOSIS — Z9012 Acquired absence of left breast and nipple: Secondary | ICD-10-CM

## 2012-04-08 DIAGNOSIS — Z1231 Encounter for screening mammogram for malignant neoplasm of breast: Secondary | ICD-10-CM

## 2012-04-08 DIAGNOSIS — Z853 Personal history of malignant neoplasm of breast: Secondary | ICD-10-CM

## 2012-04-09 DIAGNOSIS — I10 Essential (primary) hypertension: Secondary | ICD-10-CM | POA: Diagnosis not present

## 2012-04-09 DIAGNOSIS — E7800 Pure hypercholesterolemia, unspecified: Secondary | ICD-10-CM | POA: Diagnosis not present

## 2012-04-09 DIAGNOSIS — E1129 Type 2 diabetes mellitus with other diabetic kidney complication: Secondary | ICD-10-CM | POA: Diagnosis not present

## 2012-05-15 ENCOUNTER — Ambulatory Visit
Admission: RE | Admit: 2012-05-15 | Discharge: 2012-05-15 | Disposition: A | Discharge status: Home / Self Care | Payer: Medicare Other | Source: Ambulatory Visit | Attending: Obstetrics and Gynecology | Admitting: Obstetrics and Gynecology

## 2012-05-15 DIAGNOSIS — Z1231 Encounter for screening mammogram for malignant neoplasm of breast: Secondary | ICD-10-CM | POA: Diagnosis not present

## 2012-05-15 DIAGNOSIS — Z9010 Acquired absence of unspecified breast and nipple: Secondary | ICD-10-CM | POA: Diagnosis not present

## 2012-05-15 DIAGNOSIS — Z9012 Acquired absence of left breast and nipple: Secondary | ICD-10-CM

## 2012-05-15 DIAGNOSIS — Z853 Personal history of malignant neoplasm of breast: Secondary | ICD-10-CM | POA: Diagnosis not present

## 2012-07-04 DIAGNOSIS — E1129 Type 2 diabetes mellitus with other diabetic kidney complication: Secondary | ICD-10-CM | POA: Diagnosis not present

## 2012-07-04 DIAGNOSIS — E0500 Thyrotoxicosis with diffuse goiter without thyrotoxic crisis or storm: Secondary | ICD-10-CM | POA: Diagnosis not present

## 2012-07-04 DIAGNOSIS — E7800 Pure hypercholesterolemia, unspecified: Secondary | ICD-10-CM | POA: Diagnosis not present

## 2012-07-04 DIAGNOSIS — I10 Essential (primary) hypertension: Secondary | ICD-10-CM | POA: Diagnosis not present

## 2012-07-09 DIAGNOSIS — I10 Essential (primary) hypertension: Secondary | ICD-10-CM | POA: Diagnosis not present

## 2012-07-09 DIAGNOSIS — K5900 Constipation, unspecified: Secondary | ICD-10-CM | POA: Diagnosis not present

## 2012-07-09 DIAGNOSIS — IMO0001 Reserved for inherently not codable concepts without codable children: Secondary | ICD-10-CM | POA: Diagnosis not present

## 2012-07-09 DIAGNOSIS — E119 Type 2 diabetes mellitus without complications: Secondary | ICD-10-CM | POA: Diagnosis not present

## 2012-07-09 DIAGNOSIS — E0500 Thyrotoxicosis with diffuse goiter without thyrotoxic crisis or storm: Secondary | ICD-10-CM | POA: Diagnosis not present

## 2012-07-11 DIAGNOSIS — E7800 Pure hypercholesterolemia, unspecified: Secondary | ICD-10-CM | POA: Diagnosis not present

## 2012-07-11 DIAGNOSIS — I10 Essential (primary) hypertension: Secondary | ICD-10-CM | POA: Diagnosis not present

## 2012-07-11 DIAGNOSIS — E1129 Type 2 diabetes mellitus with other diabetic kidney complication: Secondary | ICD-10-CM | POA: Diagnosis not present

## 2012-07-16 DIAGNOSIS — L259 Unspecified contact dermatitis, unspecified cause: Secondary | ICD-10-CM | POA: Diagnosis not present

## 2012-07-16 DIAGNOSIS — E1149 Type 2 diabetes mellitus with other diabetic neurological complication: Secondary | ICD-10-CM | POA: Diagnosis not present

## 2012-08-01 DIAGNOSIS — H2510 Age-related nuclear cataract, unspecified eye: Secondary | ICD-10-CM | POA: Diagnosis not present

## 2012-08-01 DIAGNOSIS — E1139 Type 2 diabetes mellitus with other diabetic ophthalmic complication: Secondary | ICD-10-CM | POA: Diagnosis not present

## 2012-08-05 DIAGNOSIS — M2040 Other hammer toe(s) (acquired), unspecified foot: Secondary | ICD-10-CM | POA: Diagnosis not present

## 2012-08-20 DIAGNOSIS — IMO0001 Reserved for inherently not codable concepts without codable children: Secondary | ICD-10-CM | POA: Diagnosis not present

## 2012-08-20 DIAGNOSIS — E7800 Pure hypercholesterolemia, unspecified: Secondary | ICD-10-CM | POA: Diagnosis not present

## 2012-08-20 DIAGNOSIS — K5900 Constipation, unspecified: Secondary | ICD-10-CM | POA: Diagnosis not present

## 2012-08-20 DIAGNOSIS — Z006 Encounter for examination for normal comparison and control in clinical research program: Secondary | ICD-10-CM | POA: Diagnosis not present

## 2012-08-20 DIAGNOSIS — E119 Type 2 diabetes mellitus without complications: Secondary | ICD-10-CM | POA: Diagnosis not present

## 2012-10-08 ENCOUNTER — Telehealth: Payer: Self-pay | Admitting: Oncology

## 2012-10-08 DIAGNOSIS — E1129 Type 2 diabetes mellitus with other diabetic kidney complication: Secondary | ICD-10-CM | POA: Diagnosis not present

## 2012-10-08 DIAGNOSIS — E7800 Pure hypercholesterolemia, unspecified: Secondary | ICD-10-CM | POA: Diagnosis not present

## 2012-10-08 NOTE — Telephone Encounter (Signed)
Faxed pt medical records to [facility] [facility]

## 2012-10-10 DIAGNOSIS — I10 Essential (primary) hypertension: Secondary | ICD-10-CM | POA: Diagnosis not present

## 2012-10-10 DIAGNOSIS — E7800 Pure hypercholesterolemia, unspecified: Secondary | ICD-10-CM | POA: Diagnosis not present

## 2012-10-10 DIAGNOSIS — E1129 Type 2 diabetes mellitus with other diabetic kidney complication: Secondary | ICD-10-CM | POA: Diagnosis not present

## 2012-10-25 DIAGNOSIS — I739 Peripheral vascular disease, unspecified: Secondary | ICD-10-CM | POA: Diagnosis not present

## 2012-10-25 DIAGNOSIS — L608 Other nail disorders: Secondary | ICD-10-CM | POA: Diagnosis not present

## 2012-10-25 DIAGNOSIS — E1159 Type 2 diabetes mellitus with other circulatory complications: Secondary | ICD-10-CM | POA: Diagnosis not present

## 2013-01-07 DIAGNOSIS — E1129 Type 2 diabetes mellitus with other diabetic kidney complication: Secondary | ICD-10-CM | POA: Diagnosis not present

## 2013-01-07 DIAGNOSIS — E7800 Pure hypercholesterolemia, unspecified: Secondary | ICD-10-CM | POA: Diagnosis not present

## 2013-01-09 DIAGNOSIS — E1129 Type 2 diabetes mellitus with other diabetic kidney complication: Secondary | ICD-10-CM | POA: Diagnosis not present

## 2013-01-09 DIAGNOSIS — E7800 Pure hypercholesterolemia, unspecified: Secondary | ICD-10-CM | POA: Diagnosis not present

## 2013-01-09 DIAGNOSIS — I10 Essential (primary) hypertension: Secondary | ICD-10-CM | POA: Diagnosis not present

## 2013-01-27 DIAGNOSIS — Z124 Encounter for screening for malignant neoplasm of cervix: Secondary | ICD-10-CM | POA: Diagnosis not present

## 2013-01-30 DIAGNOSIS — E1139 Type 2 diabetes mellitus with other diabetic ophthalmic complication: Secondary | ICD-10-CM | POA: Diagnosis not present

## 2013-01-30 DIAGNOSIS — H2510 Age-related nuclear cataract, unspecified eye: Secondary | ICD-10-CM | POA: Diagnosis not present

## 2013-04-04 ENCOUNTER — Other Ambulatory Visit: Payer: Self-pay | Admitting: Endocrinology

## 2013-04-04 DIAGNOSIS — E119 Type 2 diabetes mellitus without complications: Secondary | ICD-10-CM | POA: Diagnosis not present

## 2013-04-04 DIAGNOSIS — Z79899 Other long term (current) drug therapy: Secondary | ICD-10-CM | POA: Diagnosis not present

## 2013-04-04 DIAGNOSIS — IMO0002 Reserved for concepts with insufficient information to code with codable children: Secondary | ICD-10-CM | POA: Diagnosis not present

## 2013-04-04 DIAGNOSIS — R109 Unspecified abdominal pain: Secondary | ICD-10-CM

## 2013-04-04 DIAGNOSIS — J9819 Other pulmonary collapse: Secondary | ICD-10-CM | POA: Diagnosis not present

## 2013-04-04 DIAGNOSIS — M546 Pain in thoracic spine: Secondary | ICD-10-CM | POA: Diagnosis not present

## 2013-04-04 DIAGNOSIS — R079 Chest pain, unspecified: Secondary | ICD-10-CM | POA: Diagnosis not present

## 2013-04-07 ENCOUNTER — Ambulatory Visit
Admission: RE | Admit: 2013-04-07 | Discharge: 2013-04-07 | Disposition: A | Discharge status: Home / Self Care | Payer: Medicare Other | Source: Ambulatory Visit | Attending: Endocrinology | Admitting: Endocrinology

## 2013-04-07 DIAGNOSIS — R109 Unspecified abdominal pain: Secondary | ICD-10-CM | POA: Diagnosis not present

## 2013-04-07 MED ORDER — IOHEXOL 300 MG/ML  SOLN
30.0000 mL | Freq: Once | INTRAMUSCULAR | Status: AC | PRN
  Administered 2013-04-07: 30 mL via ORAL

## 2013-04-07 MED ORDER — IOHEXOL 300 MG/ML  SOLN
100.0000 mL | Freq: Once | INTRAMUSCULAR | Status: AC | PRN
  Administered 2013-04-07: 100 mL via INTRAVENOUS

## 2013-04-08 ENCOUNTER — Other Ambulatory Visit: Payer: Self-pay

## 2013-04-08 DIAGNOSIS — Z9012 Acquired absence of left breast and nipple: Secondary | ICD-10-CM

## 2013-04-08 DIAGNOSIS — Z853 Personal history of malignant neoplasm of breast: Secondary | ICD-10-CM

## 2013-04-08 DIAGNOSIS — Z1231 Encounter for screening mammogram for malignant neoplasm of breast: Secondary | ICD-10-CM

## 2013-04-15 DIAGNOSIS — E1129 Type 2 diabetes mellitus with other diabetic kidney complication: Secondary | ICD-10-CM | POA: Diagnosis not present

## 2013-04-15 DIAGNOSIS — E7800 Pure hypercholesterolemia, unspecified: Secondary | ICD-10-CM | POA: Diagnosis not present

## 2013-04-17 DIAGNOSIS — I10 Essential (primary) hypertension: Secondary | ICD-10-CM | POA: Diagnosis not present

## 2013-04-17 DIAGNOSIS — E8809 Other disorders of plasma-protein metabolism, not elsewhere classified: Secondary | ICD-10-CM | POA: Diagnosis not present

## 2013-04-17 DIAGNOSIS — E7800 Pure hypercholesterolemia, unspecified: Secondary | ICD-10-CM | POA: Diagnosis not present

## 2013-04-17 DIAGNOSIS — E1129 Type 2 diabetes mellitus with other diabetic kidney complication: Secondary | ICD-10-CM | POA: Diagnosis not present

## 2013-04-21 DIAGNOSIS — E7800 Pure hypercholesterolemia, unspecified: Secondary | ICD-10-CM | POA: Diagnosis not present

## 2013-05-16 ENCOUNTER — Ambulatory Visit: Payer: Medicare Other

## 2013-05-29 ENCOUNTER — Ambulatory Visit
Admission: RE | Admit: 2013-05-29 | Discharge: 2013-05-29 | Disposition: A | Discharge status: Home / Self Care | Payer: Medicare Other | Source: Ambulatory Visit

## 2013-05-29 DIAGNOSIS — Z1231 Encounter for screening mammogram for malignant neoplasm of breast: Secondary | ICD-10-CM | POA: Diagnosis not present

## 2013-05-29 DIAGNOSIS — Z9012 Acquired absence of left breast and nipple: Secondary | ICD-10-CM

## 2013-05-29 DIAGNOSIS — Z853 Personal history of malignant neoplasm of breast: Secondary | ICD-10-CM

## 2013-07-09 DIAGNOSIS — E7800 Pure hypercholesterolemia, unspecified: Secondary | ICD-10-CM | POA: Diagnosis not present

## 2013-07-09 DIAGNOSIS — E1129 Type 2 diabetes mellitus with other diabetic kidney complication: Secondary | ICD-10-CM | POA: Diagnosis not present

## 2013-07-09 DIAGNOSIS — E119 Type 2 diabetes mellitus without complications: Secondary | ICD-10-CM | POA: Diagnosis not present

## 2013-07-09 DIAGNOSIS — E0500 Thyrotoxicosis with diffuse goiter without thyrotoxic crisis or storm: Secondary | ICD-10-CM | POA: Diagnosis not present

## 2013-07-09 DIAGNOSIS — Z0000 Encounter for general adult medical examination without abnormal findings: Secondary | ICD-10-CM | POA: Diagnosis not present

## 2013-07-15 DIAGNOSIS — E0500 Thyrotoxicosis with diffuse goiter without thyrotoxic crisis or storm: Secondary | ICD-10-CM | POA: Diagnosis not present

## 2013-07-15 DIAGNOSIS — K5900 Constipation, unspecified: Secondary | ICD-10-CM | POA: Diagnosis not present

## 2013-07-15 DIAGNOSIS — E7800 Pure hypercholesterolemia, unspecified: Secondary | ICD-10-CM | POA: Diagnosis not present

## 2013-07-15 DIAGNOSIS — R51 Headache: Secondary | ICD-10-CM | POA: Diagnosis not present

## 2013-07-15 DIAGNOSIS — Z23 Encounter for immunization: Secondary | ICD-10-CM | POA: Diagnosis not present

## 2013-07-15 DIAGNOSIS — IMO0001 Reserved for inherently not codable concepts without codable children: Secondary | ICD-10-CM | POA: Diagnosis not present

## 2013-07-16 ENCOUNTER — Other Ambulatory Visit (HOSPITAL_COMMUNITY): Payer: Self-pay | Admitting: Endocrinology

## 2013-07-16 DIAGNOSIS — E0590 Thyrotoxicosis, unspecified without thyrotoxic crisis or storm: Secondary | ICD-10-CM

## 2013-07-16 DIAGNOSIS — M79609 Pain in unspecified limb: Secondary | ICD-10-CM | POA: Diagnosis not present

## 2013-07-16 DIAGNOSIS — M109 Gout, unspecified: Secondary | ICD-10-CM | POA: Diagnosis not present

## 2013-07-16 DIAGNOSIS — M19039 Primary osteoarthritis, unspecified wrist: Secondary | ICD-10-CM | POA: Diagnosis not present

## 2013-07-17 DIAGNOSIS — I10 Essential (primary) hypertension: Secondary | ICD-10-CM | POA: Diagnosis not present

## 2013-07-17 DIAGNOSIS — E7800 Pure hypercholesterolemia, unspecified: Secondary | ICD-10-CM | POA: Diagnosis not present

## 2013-07-17 DIAGNOSIS — E119 Type 2 diabetes mellitus without complications: Secondary | ICD-10-CM | POA: Diagnosis not present

## 2013-07-22 ENCOUNTER — Ambulatory Visit
Admission: RE | Admit: 2013-07-22 | Discharge: 2013-07-22 | Disposition: A | Discharge status: Home / Self Care | Payer: Medicare Other | Source: Ambulatory Visit | Attending: Endocrinology | Admitting: Endocrinology

## 2013-07-22 DIAGNOSIS — E1149 Type 2 diabetes mellitus with other diabetic neurological complication: Secondary | ICD-10-CM | POA: Diagnosis not present

## 2013-07-22 DIAGNOSIS — E042 Nontoxic multinodular goiter: Secondary | ICD-10-CM | POA: Diagnosis not present

## 2013-07-22 DIAGNOSIS — E0590 Thyrotoxicosis, unspecified without thyrotoxic crisis or storm: Secondary | ICD-10-CM

## 2013-07-29 ENCOUNTER — Encounter (HOSPITAL_COMMUNITY)
Admission: RE | Admit: 2013-07-29 | Discharge: 2013-07-29 | Disposition: A | Discharge status: Home / Self Care | Payer: Medicare Other | Source: Ambulatory Visit | Attending: Endocrinology | Admitting: Endocrinology

## 2013-07-29 ENCOUNTER — Ambulatory Visit (HOSPITAL_COMMUNITY): Payer: Medicare Other

## 2013-07-29 DIAGNOSIS — E0590 Thyrotoxicosis, unspecified without thyrotoxic crisis or storm: Secondary | ICD-10-CM | POA: Insufficient documentation

## 2013-07-30 ENCOUNTER — Encounter (HOSPITAL_COMMUNITY)
Admission: RE | Admit: 2013-07-30 | Discharge: 2013-07-30 | Disposition: A | Discharge status: Home / Self Care | Payer: Medicare Other | Source: Ambulatory Visit | Attending: Endocrinology | Admitting: Endocrinology

## 2013-07-30 ENCOUNTER — Other Ambulatory Visit (HOSPITAL_COMMUNITY): Payer: Medicare Other

## 2013-07-30 DIAGNOSIS — E0590 Thyrotoxicosis, unspecified without thyrotoxic crisis or storm: Secondary | ICD-10-CM | POA: Diagnosis not present

## 2013-07-30 DIAGNOSIS — E042 Nontoxic multinodular goiter: Secondary | ICD-10-CM | POA: Diagnosis not present

## 2013-07-30 MED ORDER — SODIUM PERTECHNETATE TC 99M INJECTION
10.0000 | Freq: Once | INTRAVENOUS | Status: AC | PRN
  Administered 2013-07-30: 10 via INTRAVENOUS

## 2013-07-30 MED ORDER — SODIUM IODIDE I 131 CAPSULE
8.7000 | Freq: Once | INTRAVENOUS | Status: AC | PRN
  Administered 2013-07-30: 8.7 via ORAL

## 2013-08-04 DIAGNOSIS — E1136 Type 2 diabetes mellitus with diabetic cataract: Secondary | ICD-10-CM | POA: Diagnosis not present

## 2013-08-04 DIAGNOSIS — E1139 Type 2 diabetes mellitus with other diabetic ophthalmic complication: Secondary | ICD-10-CM | POA: Diagnosis not present

## 2013-09-09 DIAGNOSIS — E0500 Thyrotoxicosis with diffuse goiter without thyrotoxic crisis or storm: Secondary | ICD-10-CM | POA: Diagnosis not present

## 2013-09-10 DIAGNOSIS — M25569 Pain in unspecified knee: Secondary | ICD-10-CM | POA: Diagnosis not present

## 2013-09-10 DIAGNOSIS — M129 Arthropathy, unspecified: Secondary | ICD-10-CM | POA: Diagnosis not present

## 2013-09-26 ENCOUNTER — Encounter (HOSPITAL_COMMUNITY): Payer: Self-pay | Admitting: Emergency Medicine

## 2013-09-26 ENCOUNTER — Ambulatory Visit (INDEPENDENT_AMBULATORY_CARE_PROVIDER_SITE_OTHER): Payer: Medicare Other | Admitting: Emergency Medicine

## 2013-09-26 ENCOUNTER — Emergency Department (HOSPITAL_COMMUNITY)
Admission: EM | Admit: 2013-09-26 | Discharge: 2013-09-26 | Disposition: A | Discharge status: Home / Self Care | Payer: Medicare Other | Attending: Emergency Medicine | Admitting: Emergency Medicine

## 2013-09-26 VITALS — BP 110/64 | HR 105 | Temp 97.8°F | Resp 18 | Ht 61.5 in | Wt 170.2 lb

## 2013-09-26 DIAGNOSIS — L299 Pruritus, unspecified: Secondary | ICD-10-CM | POA: Diagnosis not present

## 2013-09-26 DIAGNOSIS — Z9981 Dependence on supplemental oxygen: Secondary | ICD-10-CM | POA: Insufficient documentation

## 2013-09-26 DIAGNOSIS — R221 Localized swelling, mass and lump, neck: Secondary | ICD-10-CM | POA: Insufficient documentation

## 2013-09-26 DIAGNOSIS — E669 Obesity, unspecified: Secondary | ICD-10-CM | POA: Diagnosis not present

## 2013-09-26 DIAGNOSIS — T783XXA Angioneurotic edema, initial encounter: Secondary | ICD-10-CM

## 2013-09-26 DIAGNOSIS — Z853 Personal history of malignant neoplasm of breast: Secondary | ICD-10-CM | POA: Insufficient documentation

## 2013-09-26 DIAGNOSIS — E785 Hyperlipidemia, unspecified: Secondary | ICD-10-CM | POA: Insufficient documentation

## 2013-09-26 DIAGNOSIS — Z7982 Long term (current) use of aspirin: Secondary | ICD-10-CM | POA: Diagnosis not present

## 2013-09-26 DIAGNOSIS — G4733 Obstructive sleep apnea (adult) (pediatric): Secondary | ICD-10-CM | POA: Insufficient documentation

## 2013-09-26 DIAGNOSIS — Z79899 Other long term (current) drug therapy: Secondary | ICD-10-CM | POA: Insufficient documentation

## 2013-09-26 DIAGNOSIS — IMO0001 Reserved for inherently not codable concepts without codable children: Secondary | ICD-10-CM | POA: Diagnosis not present

## 2013-09-26 DIAGNOSIS — Z862 Personal history of diseases of the blood and blood-forming organs and certain disorders involving the immune mechanism: Secondary | ICD-10-CM | POA: Insufficient documentation

## 2013-09-26 DIAGNOSIS — T7840XA Allergy, unspecified, initial encounter: Secondary | ICD-10-CM

## 2013-09-26 DIAGNOSIS — L509 Urticaria, unspecified: Secondary | ICD-10-CM | POA: Insufficient documentation

## 2013-09-26 DIAGNOSIS — T382X5A Adverse effect of antithyroid drugs, initial encounter: Secondary | ICD-10-CM | POA: Insufficient documentation

## 2013-09-26 DIAGNOSIS — R220 Localized swelling, mass and lump, head: Secondary | ICD-10-CM | POA: Insufficient documentation

## 2013-09-26 DIAGNOSIS — E1165 Type 2 diabetes mellitus with hyperglycemia: Secondary | ICD-10-CM

## 2013-09-26 DIAGNOSIS — I10 Essential (primary) hypertension: Secondary | ICD-10-CM | POA: Diagnosis not present

## 2013-09-26 DIAGNOSIS — L259 Unspecified contact dermatitis, unspecified cause: Secondary | ICD-10-CM | POA: Diagnosis not present

## 2013-09-26 DIAGNOSIS — K219 Gastro-esophageal reflux disease without esophagitis: Secondary | ICD-10-CM | POA: Diagnosis not present

## 2013-09-26 DIAGNOSIS — M1990 Unspecified osteoarthritis, unspecified site: Secondary | ICD-10-CM | POA: Insufficient documentation

## 2013-09-26 DIAGNOSIS — L500 Allergic urticaria: Secondary | ICD-10-CM | POA: Diagnosis not present

## 2013-09-26 DIAGNOSIS — E0590 Thyrotoxicosis, unspecified without thyrotoxic crisis or storm: Secondary | ICD-10-CM | POA: Diagnosis not present

## 2013-09-26 DIAGNOSIS — E0500 Thyrotoxicosis with diffuse goiter without thyrotoxic crisis or storm: Secondary | ICD-10-CM | POA: Diagnosis not present

## 2013-09-26 MED ORDER — HYDROXYZINE HCL 25 MG PO TABS
50.0000 mg | ORAL_TABLET | Freq: Once | ORAL | Status: AC
  Administered 2013-09-26: 50 mg via ORAL
  Filled 2013-09-26: qty 2

## 2013-09-26 MED ORDER — CETIRIZINE HCL 10 MG PO CAPS: 10.0000 mg | ORAL_CAPSULE | Freq: Every day | ORAL | Status: DC

## 2013-09-26 MED ORDER — FAMOTIDINE 20 MG PO TABS: 20.0000 mg | ORAL_TABLET | Freq: Every day | ORAL | Status: DC

## 2013-09-26 MED ORDER — DIPHENHYDRAMINE HCL 50 MG/ML IJ SOLN
25.0000 mg | Freq: Once | INTRAMUSCULAR | Status: AC
  Administered 2013-09-26: 25 mg via INTRAMUSCULAR

## 2013-09-26 NOTE — ED Provider Notes (Signed)
CSN: [id number]     Arrival date & time 09/26/13  1911 History   First MD Initiated Contact with Patient 09/26/13 1920     Chief Complaint  Patient presents with  . Allergic Reaction   HPI Comments: Patient is a 73 y.o. Female who presents to the ED with complaint of allergic reaction.  Patient states that she was started on metimazole 10 days ago for her thyroid.  She has had reactions to this medication several times in the past.  Patient states that she developed a rash on Wednesday evening and today at approximately 1 pm she developed swelling of her upper lip.  Patient states that she went to the Urgent care at the urging of her doctor and was given IM benadryl and zantac there.  She was transferred her via EMS for concern for her airway.  She was given more benadryl and oral pepcid at that time.  Upon arrival here the patient states that her lips and mouth feel much less swollen and that her rash is better than it was before but she is still really itchy.  She has been using cool baths and taking zyrtec at home with little relief.  Patient is a 73 y.o. female presenting with allergic reaction. The history is provided by the patient. No language interpreter was used.  Allergic Reaction Presenting symptoms: rash   Presenting symptoms: no difficulty swallowing     Past Medical History  Diagnosis Date  . Hyperlipidemia   . Hypertension   . Chronic anemia   . GERD (gastroesophageal reflux disease)   . Obesity   . Osteoarthritis   . Right bundle branch block   . Atherosclerosis of aorta   . Multinodular goiter   . Type II or unspecified type diabetes mellitus without mention of complication, uncontrolled   . Carotid artery disease     08/9627 w/ 52-84% LICA stenosis and <13% RICA stenosis  . OSA on CPAP   . Chronic headaches   . Breast cancer     "cancer in both breasts; s/p left mastectomy   Past Surgical History  Procedure Laterality Date  . Tubal ligation  1968  . Mastectomy   2000    left  . Breast biopsy  1993; 1995; 2000    left; right; left   Family History  Problem Relation Age of Onset  . Diabetes Sister   . Diabetes Brother     x3  . Hypertension Brother     x3  . Diabetes Brother   . Stroke Brother   . Heart attack Mother 32    Mother Died of MI age 72  . Stroke Sister    History  Substance Use Topics  . Smoking status: Never Smoker   . Smokeless tobacco: Never Used  . Alcohol Use: Yes     Comment: 10/04/11 "drink a wine or beeer 3-4 times/year"   OB History   Grav Para Term Preterm Abortions TAB SAB Ect Mult Living                 Review of Systems  HENT: Negative for trouble swallowing.   Respiratory: Negative for chest tightness, shortness of breath and stridor.   Cardiovascular: Negative for chest pain, palpitations and leg swelling.  Skin: Positive for rash.  All other systems reviewed and are negative.     Allergies  Tramadol and Tapazole  Home Medications   Prior to Admission medications   Medication Sig Start Date End Date Taking?  Authorizing Provider  amLODipine (NORVASC) 10 MG tablet Take 10 mg by mouth daily.   Yes Historical Provider, MD  aspirin 81 MG tablet Take 81 mg by mouth daily.     Yes Historical Provider, MD  Calcium Carbonate (CALCIUM 600 PO) Take 600 mg by mouth 2 (two) times daily.    Yes Historical Provider, MD  ezetimibe (ZETIA) 10 MG tablet Take 10 mg by mouth daily.     Yes Historical Provider, MD  glipiZIDE (GLUCOTROL) 10 MG 24 hr tablet Take 10 mg by mouth 2 (two) times daily.     Yes Historical Provider, MD  lisinopril-hydrochlorothiazide (PRINZIDE,ZESTORETIC) 20-12.5 MG per tablet Take 1 tablet by mouth daily.     Yes Historical Provider, MD  naproxen sodium (ANAPROX) 220 MG tablet Take 440 mg by mouth 2 (two) times daily as needed. For pain   Yes Historical Provider, MD  Omega-3 Fatty Acids (FISH OIL) 1200 MG CAPS Take 1 capsule by mouth daily.   Yes Historical Provider, MD  omeprazole  (PRILOSEC) 20 MG capsule Take 20 mg by mouth daily.     Yes Historical Provider, MD  SitaGLIPtin-MetFORMIN HCl (JANUMET XR) 50-1000 MG TB24 Take 2 tablets by mouth daily.   Yes Historical Provider, MD  Cetirizine HCl (ZYRTEC ALLERGY) 10 MG CAPS Take 1 capsule (10 mg total) by mouth at bedtime. 09/26/13    Carmack A Forcucci, PA-C  famotidine (PEPCID) 20 MG tablet Take 1 tablet (20 mg total) by mouth daily. 09/26/13   Jael Kostick A Forcucci, PA-C  rosuvastatin (CRESTOR) 20 MG tablet Take 20 mg by mouth daily.    Historical Provider, MD   BP 138/62  Pulse 99  Temp(Src) 99.2 F (37.3 C) (Oral)  Resp 22  SpO2 98% Physical Exam  Nursing note and vitals reviewed. Constitutional: She is oriented to person, place, and time. She appears well-developed and well-nourished. No distress.  HENT:  Head: Normocephalic and atraumatic.  Nose: Nose normal.  Mouth/Throat: Oropharynx is clear and moist. No oropharyngeal exudate.  Mild swelling of the lower lip.  Eyes: Conjunctivae and EOM are normal. Pupils are equal, round, and reactive to light. No scleral icterus.  Neck: Normal range of motion. Neck supple. No JVD present. No thyromegaly present.  Cardiovascular: Normal rate, regular rhythm, normal heart sounds and intact distal pulses.  Exam reveals no gallop and no friction rub.   No murmur heard. Pulmonary/Chest: Effort normal and breath sounds normal. No respiratory distress. She has no wheezes. She has no rales. She exhibits no tenderness.  Abdominal: Soft. Bowel sounds are normal. She exhibits no distension and no mass. There is no tenderness. There is no rebound and no guarding.  Musculoskeletal: Normal range of motion.  Lymphadenopathy:    She has no cervical adenopathy.  Neurological: She is alert and oriented to person, place, and time.  Skin: Skin is warm. Rash noted. Rash is urticarial. She is not diaphoretic.  Urticarial warm rash of the arms, medial thighs, and pubic area.    Psychiatric: She  has a normal mood and affect. Her behavior is normal. Judgment and thought content normal.    ED Course  Procedures (including critical care time) Labs Review Labs Reviewed - No data to display  Imaging Review No results found.   EKG Interpretation None      MDM   Final diagnoses:  Allergic reaction, initial encounter   Patient presents to the ED with allergic reaction.  She has had no recent changes in personal products or  medications at this time other than the addition of metimazole which the patient has had some mild reactions to in the past.  On presentation to the ED the patient has a patent airway with no difficulty breathing, swallowing, or talking.  She was able to take an oral zantac prior to coming to the ED.  I have provided some vistaryl here in the ED for itching at this time.  As the patient is a diabetic I did not want to given any steroids to cause hyperglycemia.  At this time the patient is stable for discharge.  Will prescribe the patient oral pepcid and zyrtec for itching.  She was told to discontinue her metimazole at this time and to follow-up with her doctor on Monday morning.  She was told to return for worsening shortness of breath or continued swelling of the face and lips.  She states her understanding at this time.        Cherylann Parr, PA-C 09/26/13 2102

## 2013-09-26 NOTE — Discharge Instructions (Signed)
Drug Allergy °Allergic reactions to medicines are common. Some allergic reactions are mild. A delayed type of drug allergy that occurs 1 week or more after exposure to a medicine or vaccine is called serum sickness. A life-threatening, sudden (acute) allergic reaction that involves the whole body is called anaphylaxis. °CAUSES  °"True" drug allergies occur when there is an allergic reaction to a medicine. This is caused by overactivity of the immune system. First, the body becomes sensitized. The immune system is triggered by your first exposure to the medicine. Following this first exposure, future exposure to the same medicine may be life-threatening. °Almost any medicine can cause an allergic reaction. Common ones are: °· Penicillin. °· Sulfonamides (sulfa drugs). °· Local anesthetics. °· X-ray dyes that contain iodine. °SYMPTOMS  °Common symptoms of a minor allergic reaction are: °· Swelling around the mouth. °· An itchy red rash or hives. °· Vomiting or diarrhea. °Anaphylaxis can cause swelling of the mouth and throat. This makes it difficult to breathe and swallow. Severe reactions can be fatal within seconds, even after exposure to only a trace amount of the drug that causes the reaction. °HOME CARE INSTRUCTIONS  °· If you are unsure of what caused your reaction, keep a diary of foods and medicines used. Include the symptoms that followed. Avoid anything that causes reactions. °· You may want to follow up with an allergy specialist after the reaction has cleared in order to be tested to confirm the allergy. It is important to confirm that your reaction is an allergy, not just a side effect to the medicine. If you have a true allergy to a medicine, this may prevent that medicine and related medicines from being given to you when you are very ill. °· If you have hives or a rash: °¨ Take medicines as directed by your caregiver. °¨ You may use an over-the-counter antihistamine (diphenhydramine) as  needed. °¨ Apply cold compresses to the skin or take baths in cool water. Avoid hot baths or showers. °· If you are severely allergic: °¨ Continuous observation after a severe reaction may be needed. Hospitalization is often required. °¨ Wear a medical alert bracelet or necklace stating your allergy. °¨ You and your family must learn how to use an anaphylaxis kit or give an epinephrine injection to temporarily treat an emergency allergic reaction. If you have had a severe reaction, always carry your epinephrine injection or anaphylaxis kit with you. This can be lifesaving if you have a severe reaction. °· Do not drive or perform tasks after treatment until the medicines used to treat your reaction have worn off, or until your caregiver says it is okay. °SEEK MEDICAL CARE IF:  °· You think you had an allergic reaction. Symptoms usually start within 30 minutes after exposure. °· Symptoms are getting worse rather than better. °· You develop new symptoms. °· The symptoms that brought you to your caregiver return. °SEEK IMMEDIATE MEDICAL CARE IF:  °· You have swelling of the mouth, difficulty breathing, or wheezing. °· You have a tight feeling in your chest or throat. °· You develop hives, swelling, or itching all over your body. °· You develop severe vomiting or diarrhea. °· You feel faint or pass out. °This is an emergency. Use your epinephrine injection or anaphylaxis kit as you have been instructed. Call for emergency medical help. Even if you improve after the injection, you need to be examined at a hospital emergency department. °MAKE SURE YOU:  °· Understand these instructions. °· Will watch   your condition.  Will get help right away if you are not doing well or get worse. Document Released: 03/06/2005 Document Revised: 05/29/2011 Document Reviewed: 08/10/2010 [facility] Patient Information 2015 [place], [place]. This information is not intended to replace advice given to you by your health care provider. Make  sure you discuss any questions you have with your health care provider.

## 2013-09-26 NOTE — ED Notes (Signed)
Bed: WA21 Expected date:  Expected time:  Means of arrival:  Comments: EMS-allergic reaction

## 2013-09-26 NOTE — Progress Notes (Signed)
   Subjective:    Patient ID: Karen West, female    DOB: [birth year], 73 y.o.   MRN: [id number] This chart was scribed for Arlyss Queen, MD by Vernell Barrier, Medical Scribe. The patient was seen in room 7. This patient's care was started at 6:19 PM.  HPI HPI Comments: Karen West is a 73 y.o. female w/ hx of diabetes [facility] for allergic reaction to Methimazole. Was taking the medication for 10 days. Stopped taking 2 days ago.. States 6 days ago, her urine was a different color but she didn't pay any mind to the abnormality. Noticed skin breaking out in urticarial rash with generalized itching 2 days prior; today states rash is mild in comparison to the past couple of days. Today around 1 PM noticed her lips were swelling significantly. Checked blood sugar this morning and states it was today was 116. Controlled with pills. Took a Zyrtec 5.5 hours ago; took 1 yesterday as well. Went to PCP today and had blood work completed which showed liver tests were elevated. Advised to seek medical attention. Denies trouble swallowing, scratchy throat, SOB.   Review of Systems  HENT: Positive for facial swelling. Negative for sore throat and trouble swallowing.   Respiratory: Negative for shortness of breath.   Skin: Positive for rash.   Objective:  Physical Exam CONSTITUTIONAL: Well developed/well nourished; not in any acute distress HEAD: Normocephalic/atraumatic; lower lip is significantly swollen EYES: EOMI/PERRL ENMT: Mucous membranes moist NECK: supple no meningeal signs; no stridor SPINE:entire spine nontender CV: S1/S2 noted, no murmurs/rubs/gallops noted LUNGS: Lungs are clear to auscultation bilaterally, no apparent distress ABDOMEN: soft, nontender, no rebound or guarding GU:no cva tenderness NEURO: Pt is awake/alert, moves all extremitiesx4 EXTREMITIES: pulses normal, full ROM SKIN: warm, color normal; diffuse urticaria with giant hives on  extremities and trunk  PSYCH: no abnormalities of mood noted  Assessment & Plan:  Patient presents with hives and angiedema most like secondary to methimazole . She has already taken a zyrtec at 1 pm. Received 25 mg Benadryl, 300 mg Zantac. see if EMS can establish line and give Solumedrol. Apparently she was also told there were other abnormalities in her blood work including elevated LFTs but I could not find these results  In epic. I personally performed the services described in this documentation, which was scribed in my presence. The recorded information has been reviewed and is accurate.

## 2013-09-26 NOTE — ED Provider Notes (Signed)
Medical screening examination/treatment/procedure(s) were conducted as a shared visit with non-physician practitioner(s) and myself.  I personally evaluated the patient during the encounter.   EKG Interpretation None       Orlie Dakin, MD 09/26/13 2332

## 2013-09-26 NOTE — ED Notes (Signed)
Pt presented by EMS from urgent care, report of allergic at noon today, states had lip and tough swelling, uticaria. No shortness of breath reported. Of note pt was put on Methodorozole abx for UTI which she report has had a previous reaction to. Pt has already received; total 50mg  IM Benadryl, 300 mg Zantac IM and 10 mg PO Zantac. Reports feeling better at this time.

## 2013-09-26 NOTE — ED Provider Notes (Signed)
Patient with lower lip swelling and diffuse hive-like rash onset yesterday. No dysphagia. No swelling of tongue. She feels much improved since treatment in the emergency department. She's been told by her endocrinologist Dr. Bubba Camp he stop all medications until Monday, July 13.. She's advised to return here or see urgent care Center or primary care physician it's rash or swelling worsens  Orlie Dakin, MD 09/26/13 2105

## 2013-09-29 DIAGNOSIS — N179 Acute kidney failure, unspecified: Secondary | ICD-10-CM | POA: Diagnosis not present

## 2013-09-29 DIAGNOSIS — R7989 Other specified abnormal findings of blood chemistry: Secondary | ICD-10-CM | POA: Diagnosis not present

## 2013-09-29 DIAGNOSIS — Z79899 Other long term (current) drug therapy: Secondary | ICD-10-CM | POA: Diagnosis not present

## 2013-09-29 DIAGNOSIS — T50995A Adverse effect of other drugs, medicaments and biological substances, initial encounter: Secondary | ICD-10-CM | POA: Diagnosis not present

## 2013-09-30 DIAGNOSIS — N179 Acute kidney failure, unspecified: Secondary | ICD-10-CM | POA: Diagnosis not present

## 2013-09-30 DIAGNOSIS — R748 Abnormal levels of other serum enzymes: Secondary | ICD-10-CM | POA: Diagnosis not present

## 2013-10-01 DIAGNOSIS — I739 Peripheral vascular disease, unspecified: Secondary | ICD-10-CM | POA: Diagnosis not present

## 2013-10-01 DIAGNOSIS — R269 Unspecified abnormalities of gait and mobility: Secondary | ICD-10-CM | POA: Diagnosis not present

## 2013-10-01 DIAGNOSIS — L608 Other nail disorders: Secondary | ICD-10-CM | POA: Diagnosis not present

## 2013-10-01 DIAGNOSIS — E1159 Type 2 diabetes mellitus with other circulatory complications: Secondary | ICD-10-CM | POA: Diagnosis not present

## 2013-10-03 DIAGNOSIS — R7402 Elevation of levels of lactic acid dehydrogenase (LDH): Secondary | ICD-10-CM | POA: Diagnosis not present

## 2013-10-03 DIAGNOSIS — R7401 Elevation of levels of liver transaminase levels: Secondary | ICD-10-CM | POA: Diagnosis not present

## 2013-10-07 DIAGNOSIS — I10 Essential (primary) hypertension: Secondary | ICD-10-CM | POA: Diagnosis not present

## 2013-10-07 DIAGNOSIS — E1129 Type 2 diabetes mellitus with other diabetic kidney complication: Secondary | ICD-10-CM | POA: Diagnosis not present

## 2013-10-07 DIAGNOSIS — E0500 Thyrotoxicosis with diffuse goiter without thyrotoxic crisis or storm: Secondary | ICD-10-CM | POA: Diagnosis not present

## 2013-10-07 DIAGNOSIS — E7800 Pure hypercholesterolemia, unspecified: Secondary | ICD-10-CM | POA: Diagnosis not present

## 2013-10-08 DIAGNOSIS — I129 Hypertensive chronic kidney disease with stage 1 through stage 4 chronic kidney disease, or unspecified chronic kidney disease: Secondary | ICD-10-CM | POA: Diagnosis not present

## 2013-10-08 DIAGNOSIS — N189 Chronic kidney disease, unspecified: Secondary | ICD-10-CM | POA: Diagnosis not present

## 2013-10-13 DIAGNOSIS — E1129 Type 2 diabetes mellitus with other diabetic kidney complication: Secondary | ICD-10-CM | POA: Diagnosis not present

## 2013-10-13 DIAGNOSIS — E0500 Thyrotoxicosis with diffuse goiter without thyrotoxic crisis or storm: Secondary | ICD-10-CM | POA: Diagnosis not present

## 2013-10-13 DIAGNOSIS — E7800 Pure hypercholesterolemia, unspecified: Secondary | ICD-10-CM | POA: Diagnosis not present

## 2013-10-16 DIAGNOSIS — E7800 Pure hypercholesterolemia, unspecified: Secondary | ICD-10-CM | POA: Diagnosis not present

## 2013-10-16 DIAGNOSIS — E1129 Type 2 diabetes mellitus with other diabetic kidney complication: Secondary | ICD-10-CM | POA: Diagnosis not present

## 2013-10-16 DIAGNOSIS — I10 Essential (primary) hypertension: Secondary | ICD-10-CM | POA: Diagnosis not present

## 2013-10-28 DIAGNOSIS — IMO0001 Reserved for inherently not codable concepts without codable children: Secondary | ICD-10-CM | POA: Diagnosis not present

## 2013-10-28 DIAGNOSIS — E0520 Thyrotoxicosis with toxic multinodular goiter without thyrotoxic crisis or storm: Secondary | ICD-10-CM | POA: Diagnosis not present

## 2013-11-07 DIAGNOSIS — E119 Type 2 diabetes mellitus without complications: Secondary | ICD-10-CM | POA: Diagnosis not present

## 2013-11-07 DIAGNOSIS — E0500 Thyrotoxicosis with diffuse goiter without thyrotoxic crisis or storm: Secondary | ICD-10-CM | POA: Diagnosis not present

## 2013-11-07 DIAGNOSIS — E7800 Pure hypercholesterolemia, unspecified: Secondary | ICD-10-CM | POA: Diagnosis not present

## 2013-11-07 DIAGNOSIS — I10 Essential (primary) hypertension: Secondary | ICD-10-CM | POA: Diagnosis not present

## 2013-11-10 DIAGNOSIS — E0500 Thyrotoxicosis with diffuse goiter without thyrotoxic crisis or storm: Secondary | ICD-10-CM | POA: Diagnosis not present

## 2013-11-10 DIAGNOSIS — E049 Nontoxic goiter, unspecified: Secondary | ICD-10-CM | POA: Diagnosis not present

## 2013-11-13 DIAGNOSIS — E1129 Type 2 diabetes mellitus with other diabetic kidney complication: Secondary | ICD-10-CM | POA: Diagnosis not present

## 2013-11-13 DIAGNOSIS — I10 Essential (primary) hypertension: Secondary | ICD-10-CM | POA: Diagnosis not present

## 2013-11-13 DIAGNOSIS — E7800 Pure hypercholesterolemia, unspecified: Secondary | ICD-10-CM | POA: Diagnosis not present

## 2013-12-10 DIAGNOSIS — I129 Hypertensive chronic kidney disease with stage 1 through stage 4 chronic kidney disease, or unspecified chronic kidney disease: Secondary | ICD-10-CM | POA: Diagnosis not present

## 2013-12-10 DIAGNOSIS — N181 Chronic kidney disease, stage 1: Secondary | ICD-10-CM | POA: Diagnosis not present

## 2013-12-18 DIAGNOSIS — E780 Pure hypercholesterolemia: Secondary | ICD-10-CM | POA: Diagnosis not present

## 2013-12-18 DIAGNOSIS — E1329 Other specified diabetes mellitus with other diabetic kidney complication: Secondary | ICD-10-CM | POA: Diagnosis not present

## 2013-12-23 DIAGNOSIS — E785 Hyperlipidemia, unspecified: Secondary | ICD-10-CM | POA: Diagnosis not present

## 2013-12-23 DIAGNOSIS — R5383 Other fatigue: Secondary | ICD-10-CM | POA: Diagnosis not present

## 2013-12-23 DIAGNOSIS — I10 Essential (primary) hypertension: Secondary | ICD-10-CM | POA: Diagnosis not present

## 2013-12-23 DIAGNOSIS — E119 Type 2 diabetes mellitus without complications: Secondary | ICD-10-CM | POA: Diagnosis not present

## 2014-01-12 DIAGNOSIS — Z853 Personal history of malignant neoplasm of breast: Secondary | ICD-10-CM | POA: Diagnosis not present

## 2014-01-12 DIAGNOSIS — E0590 Thyrotoxicosis, unspecified without thyrotoxic crisis or storm: Secondary | ICD-10-CM | POA: Diagnosis not present

## 2014-01-12 DIAGNOSIS — Z888 Allergy status to other drugs, medicaments and biological substances status: Secondary | ICD-10-CM | POA: Diagnosis not present

## 2014-01-12 DIAGNOSIS — E785 Hyperlipidemia, unspecified: Secondary | ICD-10-CM | POA: Diagnosis not present

## 2014-01-12 DIAGNOSIS — K219 Gastro-esophageal reflux disease without esophagitis: Secondary | ICD-10-CM | POA: Diagnosis not present

## 2014-01-12 DIAGNOSIS — E0580 Other thyrotoxicosis without thyrotoxic crisis or storm: Secondary | ICD-10-CM | POA: Diagnosis not present

## 2014-01-12 DIAGNOSIS — I10 Essential (primary) hypertension: Secondary | ICD-10-CM | POA: Diagnosis not present

## 2014-01-12 DIAGNOSIS — Z923 Personal history of irradiation: Secondary | ICD-10-CM | POA: Diagnosis not present

## 2014-01-12 DIAGNOSIS — R002 Palpitations: Secondary | ICD-10-CM | POA: Diagnosis not present

## 2014-01-12 DIAGNOSIS — Z7982 Long term (current) use of aspirin: Secondary | ICD-10-CM | POA: Diagnosis not present

## 2014-01-12 DIAGNOSIS — Z9012 Acquired absence of left breast and nipple: Secondary | ICD-10-CM | POA: Diagnosis not present

## 2014-01-12 DIAGNOSIS — R29810 Facial weakness: Secondary | ICD-10-CM | POA: Diagnosis not present

## 2014-01-12 DIAGNOSIS — E042 Nontoxic multinodular goiter: Secondary | ICD-10-CM | POA: Diagnosis not present

## 2014-01-12 DIAGNOSIS — E119 Type 2 diabetes mellitus without complications: Secondary | ICD-10-CM | POA: Diagnosis not present

## 2014-01-12 DIAGNOSIS — Z9229 Personal history of other drug therapy: Secondary | ICD-10-CM | POA: Diagnosis not present

## 2014-01-12 DIAGNOSIS — Z9221 Personal history of antineoplastic chemotherapy: Secondary | ICD-10-CM | POA: Diagnosis not present

## 2014-01-12 DIAGNOSIS — R911 Solitary pulmonary nodule: Secondary | ICD-10-CM | POA: Diagnosis not present

## 2014-01-12 DIAGNOSIS — E0520 Thyrotoxicosis with toxic multinodular goiter without thyrotoxic crisis or storm: Secondary | ICD-10-CM | POA: Diagnosis not present

## 2014-02-04 DIAGNOSIS — E1136 Type 2 diabetes mellitus with diabetic cataract: Secondary | ICD-10-CM | POA: Diagnosis not present

## 2014-02-19 DIAGNOSIS — E1151 Type 2 diabetes mellitus with diabetic peripheral angiopathy without gangrene: Secondary | ICD-10-CM | POA: Diagnosis not present

## 2014-02-19 DIAGNOSIS — L603 Nail dystrophy: Secondary | ICD-10-CM | POA: Diagnosis not present

## 2014-02-19 DIAGNOSIS — I739 Peripheral vascular disease, unspecified: Secondary | ICD-10-CM | POA: Diagnosis not present

## 2014-02-23 DIAGNOSIS — E559 Vitamin D deficiency, unspecified: Secondary | ICD-10-CM | POA: Diagnosis not present

## 2014-02-23 DIAGNOSIS — R5383 Other fatigue: Secondary | ICD-10-CM | POA: Diagnosis not present

## 2014-02-23 DIAGNOSIS — I10 Essential (primary) hypertension: Secondary | ICD-10-CM | POA: Diagnosis not present

## 2014-02-23 DIAGNOSIS — E538 Deficiency of other specified B group vitamins: Secondary | ICD-10-CM | POA: Diagnosis not present

## 2014-02-23 DIAGNOSIS — E118 Type 2 diabetes mellitus with unspecified complications: Secondary | ICD-10-CM | POA: Diagnosis not present

## 2014-02-26 DIAGNOSIS — I10 Essential (primary) hypertension: Secondary | ICD-10-CM | POA: Diagnosis not present

## 2014-02-26 DIAGNOSIS — D519 Vitamin B12 deficiency anemia, unspecified: Secondary | ICD-10-CM | POA: Diagnosis not present

## 2014-02-26 DIAGNOSIS — E119 Type 2 diabetes mellitus without complications: Secondary | ICD-10-CM | POA: Diagnosis not present

## 2014-02-26 DIAGNOSIS — E785 Hyperlipidemia, unspecified: Secondary | ICD-10-CM | POA: Diagnosis not present

## 2014-04-23 ENCOUNTER — Other Ambulatory Visit: Payer: Self-pay

## 2014-04-23 DIAGNOSIS — Z1231 Encounter for screening mammogram for malignant neoplasm of breast: Secondary | ICD-10-CM

## 2014-04-23 DIAGNOSIS — Z853 Personal history of malignant neoplasm of breast: Secondary | ICD-10-CM

## 2014-04-23 DIAGNOSIS — Z9012 Acquired absence of left breast and nipple: Secondary | ICD-10-CM

## 2014-05-26 DIAGNOSIS — I10 Essential (primary) hypertension: Secondary | ICD-10-CM | POA: Diagnosis not present

## 2014-05-26 DIAGNOSIS — D519 Vitamin B12 deficiency anemia, unspecified: Secondary | ICD-10-CM | POA: Diagnosis not present

## 2014-05-26 DIAGNOSIS — E118 Type 2 diabetes mellitus with unspecified complications: Secondary | ICD-10-CM | POA: Diagnosis not present

## 2014-05-26 DIAGNOSIS — E785 Hyperlipidemia, unspecified: Secondary | ICD-10-CM | POA: Diagnosis not present

## 2014-05-28 DIAGNOSIS — E119 Type 2 diabetes mellitus without complications: Secondary | ICD-10-CM | POA: Diagnosis not present

## 2014-05-28 DIAGNOSIS — E785 Hyperlipidemia, unspecified: Secondary | ICD-10-CM | POA: Diagnosis not present

## 2014-05-28 DIAGNOSIS — I10 Essential (primary) hypertension: Secondary | ICD-10-CM | POA: Diagnosis not present

## 2014-06-01 ENCOUNTER — Ambulatory Visit
Admission: RE | Admit: 2014-06-01 | Discharge: 2014-06-01 | Disposition: A | Discharge status: Home / Self Care | Payer: Medicare Other | Source: Ambulatory Visit

## 2014-06-01 DIAGNOSIS — Z1231 Encounter for screening mammogram for malignant neoplasm of breast: Secondary | ICD-10-CM

## 2014-06-01 DIAGNOSIS — Z9012 Acquired absence of left breast and nipple: Secondary | ICD-10-CM

## 2014-06-01 DIAGNOSIS — Z853 Personal history of malignant neoplasm of breast: Secondary | ICD-10-CM

## 2014-06-01 DIAGNOSIS — Z9013 Acquired absence of bilateral breasts and nipples: Secondary | ICD-10-CM | POA: Diagnosis not present

## 2014-07-20 DIAGNOSIS — I10 Essential (primary) hypertension: Secondary | ICD-10-CM | POA: Diagnosis not present

## 2014-07-20 DIAGNOSIS — Z853 Personal history of malignant neoplasm of breast: Secondary | ICD-10-CM | POA: Diagnosis not present

## 2014-07-20 DIAGNOSIS — E0590 Thyrotoxicosis, unspecified without thyrotoxic crisis or storm: Secondary | ICD-10-CM | POA: Diagnosis not present

## 2014-07-20 DIAGNOSIS — E119 Type 2 diabetes mellitus without complications: Secondary | ICD-10-CM | POA: Diagnosis not present

## 2014-07-20 DIAGNOSIS — E042 Nontoxic multinodular goiter: Secondary | ICD-10-CM | POA: Diagnosis not present

## 2014-07-20 DIAGNOSIS — E785 Hyperlipidemia, unspecified: Secondary | ICD-10-CM | POA: Diagnosis not present

## 2014-07-27 DIAGNOSIS — I739 Peripheral vascular disease, unspecified: Secondary | ICD-10-CM | POA: Diagnosis not present

## 2014-07-27 DIAGNOSIS — E1151 Type 2 diabetes mellitus with diabetic peripheral angiopathy without gangrene: Secondary | ICD-10-CM | POA: Diagnosis not present

## 2014-07-27 DIAGNOSIS — L603 Nail dystrophy: Secondary | ICD-10-CM | POA: Diagnosis not present

## 2014-08-31 DIAGNOSIS — Z0000 Encounter for general adult medical examination without abnormal findings: Secondary | ICD-10-CM | POA: Diagnosis not present

## 2014-08-31 DIAGNOSIS — E119 Type 2 diabetes mellitus without complications: Secondary | ICD-10-CM | POA: Diagnosis not present

## 2014-08-31 DIAGNOSIS — I10 Essential (primary) hypertension: Secondary | ICD-10-CM | POA: Diagnosis not present

## 2014-08-31 DIAGNOSIS — E785 Hyperlipidemia, unspecified: Secondary | ICD-10-CM | POA: Diagnosis not present

## 2014-09-01 DIAGNOSIS — E785 Hyperlipidemia, unspecified: Secondary | ICD-10-CM | POA: Diagnosis not present

## 2014-09-01 DIAGNOSIS — E119 Type 2 diabetes mellitus without complications: Secondary | ICD-10-CM | POA: Diagnosis not present

## 2014-09-01 DIAGNOSIS — I10 Essential (primary) hypertension: Secondary | ICD-10-CM | POA: Diagnosis not present

## 2014-09-03 DIAGNOSIS — R5383 Other fatigue: Secondary | ICD-10-CM | POA: Diagnosis not present

## 2014-09-03 DIAGNOSIS — R51 Headache: Secondary | ICD-10-CM | POA: Diagnosis not present

## 2014-09-03 DIAGNOSIS — E0590 Thyrotoxicosis, unspecified without thyrotoxic crisis or storm: Secondary | ICD-10-CM | POA: Diagnosis not present

## 2014-09-03 DIAGNOSIS — E785 Hyperlipidemia, unspecified: Secondary | ICD-10-CM | POA: Diagnosis not present

## 2014-09-03 DIAGNOSIS — K5909 Other constipation: Secondary | ICD-10-CM | POA: Diagnosis not present

## 2014-09-07 DIAGNOSIS — E0590 Thyrotoxicosis, unspecified without thyrotoxic crisis or storm: Secondary | ICD-10-CM | POA: Diagnosis not present

## 2014-09-07 DIAGNOSIS — E049 Nontoxic goiter, unspecified: Secondary | ICD-10-CM | POA: Diagnosis not present

## 2014-09-30 DIAGNOSIS — M1711 Unilateral primary osteoarthritis, right knee: Secondary | ICD-10-CM | POA: Diagnosis not present

## 2014-09-30 DIAGNOSIS — M170 Bilateral primary osteoarthritis of knee: Secondary | ICD-10-CM | POA: Diagnosis not present

## 2014-09-30 DIAGNOSIS — M1712 Unilateral primary osteoarthritis, left knee: Secondary | ICD-10-CM | POA: Diagnosis not present

## 2014-10-06 DIAGNOSIS — M1711 Unilateral primary osteoarthritis, right knee: Secondary | ICD-10-CM | POA: Diagnosis not present

## 2014-11-16 DIAGNOSIS — E1151 Type 2 diabetes mellitus with diabetic peripheral angiopathy without gangrene: Secondary | ICD-10-CM | POA: Diagnosis not present

## 2014-11-16 DIAGNOSIS — L603 Nail dystrophy: Secondary | ICD-10-CM | POA: Diagnosis not present

## 2014-11-16 DIAGNOSIS — I739 Peripheral vascular disease, unspecified: Secondary | ICD-10-CM | POA: Diagnosis not present

## 2014-12-01 DIAGNOSIS — E1165 Type 2 diabetes mellitus with hyperglycemia: Secondary | ICD-10-CM | POA: Diagnosis not present

## 2014-12-01 DIAGNOSIS — E785 Hyperlipidemia, unspecified: Secondary | ICD-10-CM | POA: Diagnosis not present

## 2014-12-01 DIAGNOSIS — I10 Essential (primary) hypertension: Secondary | ICD-10-CM | POA: Diagnosis not present

## 2014-12-01 DIAGNOSIS — E119 Type 2 diabetes mellitus without complications: Secondary | ICD-10-CM | POA: Diagnosis not present

## 2014-12-03 DIAGNOSIS — R350 Frequency of micturition: Secondary | ICD-10-CM | POA: Diagnosis not present

## 2014-12-03 DIAGNOSIS — E0590 Thyrotoxicosis, unspecified without thyrotoxic crisis or storm: Secondary | ICD-10-CM | POA: Diagnosis not present

## 2014-12-03 DIAGNOSIS — E785 Hyperlipidemia, unspecified: Secondary | ICD-10-CM | POA: Diagnosis not present

## 2014-12-03 DIAGNOSIS — I10 Essential (primary) hypertension: Secondary | ICD-10-CM | POA: Diagnosis not present

## 2014-12-03 DIAGNOSIS — E1121 Type 2 diabetes mellitus with diabetic nephropathy: Secondary | ICD-10-CM | POA: Diagnosis not present

## 2015-02-01 DIAGNOSIS — E049 Nontoxic goiter, unspecified: Secondary | ICD-10-CM | POA: Diagnosis not present

## 2015-02-01 DIAGNOSIS — R319 Hematuria, unspecified: Secondary | ICD-10-CM | POA: Diagnosis not present

## 2015-02-01 DIAGNOSIS — R350 Frequency of micturition: Secondary | ICD-10-CM | POA: Diagnosis not present

## 2015-02-01 DIAGNOSIS — R351 Nocturia: Secondary | ICD-10-CM | POA: Diagnosis not present

## 2015-02-01 DIAGNOSIS — C50919 Malignant neoplasm of unspecified site of unspecified female breast: Secondary | ICD-10-CM | POA: Diagnosis not present

## 2015-02-01 DIAGNOSIS — Z6831 Body mass index (BMI) 31.0-31.9, adult: Secondary | ICD-10-CM | POA: Diagnosis not present

## 2015-02-01 DIAGNOSIS — Z113 Encounter for screening for infections with a predominantly sexual mode of transmission: Secondary | ICD-10-CM | POA: Diagnosis not present

## 2015-02-01 DIAGNOSIS — Z01419 Encounter for gynecological examination (general) (routine) without abnormal findings: Secondary | ICD-10-CM | POA: Diagnosis not present

## 2015-02-23 DIAGNOSIS — H40013 Open angle with borderline findings, low risk, bilateral: Secondary | ICD-10-CM | POA: Diagnosis not present

## 2015-02-23 DIAGNOSIS — H25012 Cortical age-related cataract, left eye: Secondary | ICD-10-CM | POA: Diagnosis not present

## 2015-02-23 DIAGNOSIS — H25011 Cortical age-related cataract, right eye: Secondary | ICD-10-CM | POA: Diagnosis not present

## 2015-02-23 DIAGNOSIS — H2512 Age-related nuclear cataract, left eye: Secondary | ICD-10-CM | POA: Diagnosis not present

## 2015-03-01 DIAGNOSIS — E785 Hyperlipidemia, unspecified: Secondary | ICD-10-CM | POA: Diagnosis not present

## 2015-03-01 DIAGNOSIS — E1121 Type 2 diabetes mellitus with diabetic nephropathy: Secondary | ICD-10-CM | POA: Diagnosis not present

## 2015-03-01 DIAGNOSIS — E0590 Thyrotoxicosis, unspecified without thyrotoxic crisis or storm: Secondary | ICD-10-CM | POA: Diagnosis not present

## 2015-03-01 DIAGNOSIS — I10 Essential (primary) hypertension: Secondary | ICD-10-CM | POA: Diagnosis not present

## 2015-03-03 DIAGNOSIS — L821 Other seborrheic keratosis: Secondary | ICD-10-CM | POA: Diagnosis not present

## 2015-03-03 DIAGNOSIS — I10 Essential (primary) hypertension: Secondary | ICD-10-CM | POA: Diagnosis not present

## 2015-03-03 DIAGNOSIS — E0590 Thyrotoxicosis, unspecified without thyrotoxic crisis or storm: Secondary | ICD-10-CM | POA: Diagnosis not present

## 2015-03-03 DIAGNOSIS — E049 Nontoxic goiter, unspecified: Secondary | ICD-10-CM | POA: Diagnosis not present

## 2015-03-18 DIAGNOSIS — L603 Nail dystrophy: Secondary | ICD-10-CM | POA: Diagnosis not present

## 2015-03-18 DIAGNOSIS — I739 Peripheral vascular disease, unspecified: Secondary | ICD-10-CM | POA: Diagnosis not present

## 2015-03-18 DIAGNOSIS — E1151 Type 2 diabetes mellitus with diabetic peripheral angiopathy without gangrene: Secondary | ICD-10-CM | POA: Diagnosis not present

## 2015-04-05 DIAGNOSIS — I10 Essential (primary) hypertension: Secondary | ICD-10-CM | POA: Diagnosis not present

## 2015-04-15 DIAGNOSIS — I10 Essential (primary) hypertension: Secondary | ICD-10-CM | POA: Diagnosis not present

## 2015-04-19 DIAGNOSIS — J4521 Mild intermittent asthma with (acute) exacerbation: Secondary | ICD-10-CM | POA: Diagnosis not present

## 2015-04-19 DIAGNOSIS — J019 Acute sinusitis, unspecified: Secondary | ICD-10-CM | POA: Diagnosis not present

## 2015-04-21 DIAGNOSIS — I10 Essential (primary) hypertension: Secondary | ICD-10-CM | POA: Diagnosis not present

## 2015-04-21 DIAGNOSIS — J019 Acute sinusitis, unspecified: Secondary | ICD-10-CM | POA: Diagnosis not present

## 2015-04-21 DIAGNOSIS — J4521 Mild intermittent asthma with (acute) exacerbation: Secondary | ICD-10-CM | POA: Diagnosis not present

## 2015-04-26 ENCOUNTER — Other Ambulatory Visit: Payer: Self-pay

## 2015-04-26 DIAGNOSIS — Z853 Personal history of malignant neoplasm of breast: Secondary | ICD-10-CM

## 2015-05-28 DIAGNOSIS — I10 Essential (primary) hypertension: Secondary | ICD-10-CM | POA: Diagnosis not present

## 2015-05-28 DIAGNOSIS — E119 Type 2 diabetes mellitus without complications: Secondary | ICD-10-CM | POA: Diagnosis not present

## 2015-06-01 DIAGNOSIS — E785 Hyperlipidemia, unspecified: Secondary | ICD-10-CM | POA: Diagnosis not present

## 2015-06-01 DIAGNOSIS — I10 Essential (primary) hypertension: Secondary | ICD-10-CM | POA: Diagnosis not present

## 2015-06-01 DIAGNOSIS — E119 Type 2 diabetes mellitus without complications: Secondary | ICD-10-CM | POA: Diagnosis not present

## 2015-06-03 ENCOUNTER — Ambulatory Visit
Admission: RE | Admit: 2015-06-03 | Discharge: 2015-06-03 | Disposition: A | Discharge status: Home / Self Care | Payer: Medicare Other | Source: Ambulatory Visit

## 2015-06-03 DIAGNOSIS — Z853 Personal history of malignant neoplasm of breast: Secondary | ICD-10-CM

## 2015-06-03 DIAGNOSIS — Z1231 Encounter for screening mammogram for malignant neoplasm of breast: Secondary | ICD-10-CM | POA: Diagnosis not present

## 2015-07-01 DIAGNOSIS — E1151 Type 2 diabetes mellitus with diabetic peripheral angiopathy without gangrene: Secondary | ICD-10-CM | POA: Diagnosis not present

## 2015-07-01 DIAGNOSIS — I739 Peripheral vascular disease, unspecified: Secondary | ICD-10-CM | POA: Diagnosis not present

## 2015-07-01 DIAGNOSIS — L603 Nail dystrophy: Secondary | ICD-10-CM | POA: Diagnosis not present

## 2015-09-07 DIAGNOSIS — H25012 Cortical age-related cataract, left eye: Secondary | ICD-10-CM | POA: Diagnosis not present

## 2015-09-07 DIAGNOSIS — H40013 Open angle with borderline findings, low risk, bilateral: Secondary | ICD-10-CM | POA: Diagnosis not present

## 2015-09-07 DIAGNOSIS — H25011 Cortical age-related cataract, right eye: Secondary | ICD-10-CM | POA: Diagnosis not present

## 2015-09-22 DIAGNOSIS — L245 Irritant contact dermatitis due to other chemical products: Secondary | ICD-10-CM | POA: Diagnosis not present

## 2015-09-27 DIAGNOSIS — I10 Essential (primary) hypertension: Secondary | ICD-10-CM | POA: Diagnosis not present

## 2015-09-27 DIAGNOSIS — Z0000 Encounter for general adult medical examination without abnormal findings: Secondary | ICD-10-CM | POA: Diagnosis not present

## 2015-09-27 DIAGNOSIS — I6529 Occlusion and stenosis of unspecified carotid artery: Secondary | ICD-10-CM | POA: Diagnosis not present

## 2015-09-27 DIAGNOSIS — E119 Type 2 diabetes mellitus without complications: Secondary | ICD-10-CM | POA: Diagnosis not present

## 2015-09-27 DIAGNOSIS — E785 Hyperlipidemia, unspecified: Secondary | ICD-10-CM | POA: Diagnosis not present

## 2015-09-27 DIAGNOSIS — E0590 Thyrotoxicosis, unspecified without thyrotoxic crisis or storm: Secondary | ICD-10-CM | POA: Diagnosis not present

## 2015-09-27 DIAGNOSIS — E0500 Thyrotoxicosis with diffuse goiter without thyrotoxic crisis or storm: Secondary | ICD-10-CM | POA: Diagnosis not present

## 2015-10-04 ENCOUNTER — Other Ambulatory Visit: Payer: Self-pay | Admitting: Endocrinology

## 2015-10-04 DIAGNOSIS — E0590 Thyrotoxicosis, unspecified without thyrotoxic crisis or storm: Secondary | ICD-10-CM | POA: Diagnosis not present

## 2015-10-04 DIAGNOSIS — E049 Nontoxic goiter, unspecified: Secondary | ICD-10-CM | POA: Diagnosis not present

## 2015-10-04 DIAGNOSIS — N951 Menopausal and female climacteric states: Secondary | ICD-10-CM | POA: Diagnosis not present

## 2015-10-05 ENCOUNTER — Other Ambulatory Visit: Payer: Self-pay | Admitting: Endocrinology

## 2015-10-05 DIAGNOSIS — E049 Nontoxic goiter, unspecified: Secondary | ICD-10-CM

## 2015-10-07 DIAGNOSIS — I10 Essential (primary) hypertension: Secondary | ICD-10-CM | POA: Diagnosis not present

## 2015-10-07 DIAGNOSIS — Z853 Personal history of malignant neoplasm of breast: Secondary | ICD-10-CM | POA: Diagnosis not present

## 2015-10-07 DIAGNOSIS — R911 Solitary pulmonary nodule: Secondary | ICD-10-CM | POA: Diagnosis not present

## 2015-10-07 DIAGNOSIS — R002 Palpitations: Secondary | ICD-10-CM | POA: Diagnosis not present

## 2015-10-11 ENCOUNTER — Other Ambulatory Visit: Payer: Self-pay | Admitting: Internal Medicine

## 2015-10-11 DIAGNOSIS — I6529 Occlusion and stenosis of unspecified carotid artery: Secondary | ICD-10-CM

## 2015-10-12 ENCOUNTER — Ambulatory Visit
Admission: RE | Admit: 2015-10-12 | Discharge: 2015-10-12 | Disposition: A | Discharge status: Home / Self Care | Payer: Medicare Other | Source: Ambulatory Visit | Attending: Endocrinology | Admitting: Endocrinology

## 2015-10-12 ENCOUNTER — Other Ambulatory Visit: Payer: Self-pay | Admitting: Endocrinology

## 2015-10-12 DIAGNOSIS — M8580 Other specified disorders of bone density and structure, unspecified site: Secondary | ICD-10-CM

## 2015-10-12 DIAGNOSIS — E049 Nontoxic goiter, unspecified: Secondary | ICD-10-CM

## 2015-10-12 DIAGNOSIS — E042 Nontoxic multinodular goiter: Secondary | ICD-10-CM | POA: Diagnosis not present

## 2015-10-12 DIAGNOSIS — Z13820 Encounter for screening for osteoporosis: Secondary | ICD-10-CM | POA: Diagnosis not present

## 2015-10-12 DIAGNOSIS — Z780 Asymptomatic menopausal state: Secondary | ICD-10-CM | POA: Diagnosis not present

## 2015-10-20 ENCOUNTER — Other Ambulatory Visit: Payer: Self-pay | Admitting: Internal Medicine

## 2015-10-20 DIAGNOSIS — E0500 Thyrotoxicosis with diffuse goiter without thyrotoxic crisis or storm: Secondary | ICD-10-CM

## 2015-10-21 ENCOUNTER — Other Ambulatory Visit: Payer: Medicare Other

## 2015-10-21 ENCOUNTER — Ambulatory Visit
Admission: RE | Admit: 2015-10-21 | Discharge: 2015-10-21 | Disposition: A | Discharge status: Home / Self Care | Payer: Medicare Other | Source: Ambulatory Visit | Attending: Internal Medicine | Admitting: Internal Medicine

## 2015-10-21 DIAGNOSIS — I6529 Occlusion and stenosis of unspecified carotid artery: Secondary | ICD-10-CM

## 2015-10-21 DIAGNOSIS — L03032 Cellulitis of left toe: Secondary | ICD-10-CM | POA: Diagnosis not present

## 2015-10-21 DIAGNOSIS — L03031 Cellulitis of right toe: Secondary | ICD-10-CM | POA: Diagnosis not present

## 2015-10-21 DIAGNOSIS — I6523 Occlusion and stenosis of bilateral carotid arteries: Secondary | ICD-10-CM | POA: Diagnosis not present

## 2015-11-05 DIAGNOSIS — R002 Palpitations: Secondary | ICD-10-CM | POA: Diagnosis not present

## 2015-11-10 DIAGNOSIS — M170 Bilateral primary osteoarthritis of knee: Secondary | ICD-10-CM | POA: Diagnosis not present

## 2015-11-10 DIAGNOSIS — M25561 Pain in right knee: Secondary | ICD-10-CM | POA: Diagnosis not present

## 2015-11-15 DIAGNOSIS — G4733 Obstructive sleep apnea (adult) (pediatric): Secondary | ICD-10-CM | POA: Diagnosis not present

## 2015-11-15 DIAGNOSIS — R002 Palpitations: Secondary | ICD-10-CM | POA: Diagnosis not present

## 2015-11-16 ENCOUNTER — Other Ambulatory Visit: Payer: Self-pay

## 2015-11-19 DIAGNOSIS — E119 Type 2 diabetes mellitus without complications: Secondary | ICD-10-CM | POA: Diagnosis not present

## 2015-11-19 DIAGNOSIS — I10 Essential (primary) hypertension: Secondary | ICD-10-CM | POA: Diagnosis not present

## 2015-11-21 DIAGNOSIS — G4733 Obstructive sleep apnea (adult) (pediatric): Secondary | ICD-10-CM | POA: Diagnosis not present

## 2015-11-21 DIAGNOSIS — R002 Palpitations: Secondary | ICD-10-CM | POA: Diagnosis not present

## 2015-11-23 DIAGNOSIS — I10 Essential (primary) hypertension: Secondary | ICD-10-CM | POA: Diagnosis not present

## 2015-11-23 DIAGNOSIS — E119 Type 2 diabetes mellitus without complications: Secondary | ICD-10-CM | POA: Diagnosis not present

## 2015-12-08 DIAGNOSIS — I10 Essential (primary) hypertension: Secondary | ICD-10-CM | POA: Diagnosis not present

## 2015-12-08 DIAGNOSIS — E119 Type 2 diabetes mellitus without complications: Secondary | ICD-10-CM | POA: Diagnosis not present

## 2015-12-08 DIAGNOSIS — E785 Hyperlipidemia, unspecified: Secondary | ICD-10-CM | POA: Diagnosis not present

## 2015-12-09 DIAGNOSIS — E119 Type 2 diabetes mellitus without complications: Secondary | ICD-10-CM | POA: Diagnosis not present

## 2015-12-09 DIAGNOSIS — I10 Essential (primary) hypertension: Secondary | ICD-10-CM | POA: Diagnosis not present

## 2016-01-04 DIAGNOSIS — I471 Supraventricular tachycardia: Secondary | ICD-10-CM | POA: Diagnosis not present

## 2016-01-04 DIAGNOSIS — Z23 Encounter for immunization: Secondary | ICD-10-CM | POA: Diagnosis not present

## 2016-01-04 DIAGNOSIS — Z6831 Body mass index (BMI) 31.0-31.9, adult: Secondary | ICD-10-CM | POA: Diagnosis not present

## 2016-01-04 DIAGNOSIS — D649 Anemia, unspecified: Secondary | ICD-10-CM | POA: Diagnosis not present

## 2016-01-05 DIAGNOSIS — I471 Supraventricular tachycardia: Secondary | ICD-10-CM | POA: Diagnosis not present

## 2016-01-05 DIAGNOSIS — I208 Other forms of angina pectoris: Secondary | ICD-10-CM | POA: Diagnosis not present

## 2016-01-05 DIAGNOSIS — Z923 Personal history of irradiation: Secondary | ICD-10-CM | POA: Diagnosis not present

## 2016-01-05 DIAGNOSIS — I6523 Occlusion and stenosis of bilateral carotid arteries: Secondary | ICD-10-CM | POA: Diagnosis not present

## 2016-01-06 DIAGNOSIS — I10 Essential (primary) hypertension: Secondary | ICD-10-CM | POA: Diagnosis not present

## 2016-01-06 DIAGNOSIS — E1121 Type 2 diabetes mellitus with diabetic nephropathy: Secondary | ICD-10-CM | POA: Diagnosis not present

## 2016-01-06 DIAGNOSIS — K5909 Other constipation: Secondary | ICD-10-CM | POA: Diagnosis not present

## 2016-01-06 DIAGNOSIS — Z1212 Encounter for screening for malignant neoplasm of rectum: Secondary | ICD-10-CM | POA: Diagnosis not present

## 2016-01-06 DIAGNOSIS — D649 Anemia, unspecified: Secondary | ICD-10-CM | POA: Diagnosis not present

## 2016-01-10 DIAGNOSIS — D649 Anemia, unspecified: Secondary | ICD-10-CM | POA: Diagnosis not present

## 2016-01-10 DIAGNOSIS — K219 Gastro-esophageal reflux disease without esophagitis: Secondary | ICD-10-CM | POA: Diagnosis not present

## 2016-01-10 DIAGNOSIS — R319 Hematuria, unspecified: Secondary | ICD-10-CM | POA: Diagnosis not present

## 2016-01-10 DIAGNOSIS — N952 Postmenopausal atrophic vaginitis: Secondary | ICD-10-CM | POA: Diagnosis not present

## 2016-01-10 DIAGNOSIS — N898 Other specified noninflammatory disorders of vagina: Secondary | ICD-10-CM | POA: Diagnosis not present

## 2016-01-10 DIAGNOSIS — R110 Nausea: Secondary | ICD-10-CM | POA: Diagnosis not present

## 2016-01-10 DIAGNOSIS — R102 Pelvic and perineal pain: Secondary | ICD-10-CM | POA: Diagnosis not present

## 2016-01-12 DIAGNOSIS — D509 Iron deficiency anemia, unspecified: Secondary | ICD-10-CM | POA: Diagnosis not present

## 2016-01-12 DIAGNOSIS — R1110 Vomiting, unspecified: Secondary | ICD-10-CM | POA: Diagnosis not present

## 2016-01-12 DIAGNOSIS — K219 Gastro-esophageal reflux disease without esophagitis: Secondary | ICD-10-CM | POA: Diagnosis not present

## 2016-01-17 DIAGNOSIS — R002 Palpitations: Secondary | ICD-10-CM | POA: Diagnosis not present

## 2016-01-17 DIAGNOSIS — R0789 Other chest pain: Secondary | ICD-10-CM | POA: Diagnosis not present

## 2016-01-17 DIAGNOSIS — R0602 Shortness of breath: Secondary | ICD-10-CM | POA: Diagnosis not present

## 2016-01-17 DIAGNOSIS — E119 Type 2 diabetes mellitus without complications: Secondary | ICD-10-CM | POA: Diagnosis not present

## 2016-01-18 DIAGNOSIS — E138 Other specified diabetes mellitus with unspecified complications: Secondary | ICD-10-CM | POA: Diagnosis not present

## 2016-01-18 DIAGNOSIS — G478 Other sleep disorders: Secondary | ICD-10-CM | POA: Insufficient documentation

## 2016-01-18 DIAGNOSIS — G4710 Hypersomnia, unspecified: Secondary | ICD-10-CM | POA: Insufficient documentation

## 2016-01-18 DIAGNOSIS — G4730 Sleep apnea, unspecified: Secondary | ICD-10-CM | POA: Diagnosis not present

## 2016-01-18 DIAGNOSIS — G4489 Other headache syndrome: Secondary | ICD-10-CM | POA: Diagnosis not present

## 2016-01-18 DIAGNOSIS — G4733 Obstructive sleep apnea (adult) (pediatric): Secondary | ICD-10-CM | POA: Diagnosis not present

## 2016-01-18 DIAGNOSIS — I10 Essential (primary) hypertension: Secondary | ICD-10-CM | POA: Diagnosis not present

## 2016-01-27 DIAGNOSIS — I10 Essential (primary) hypertension: Secondary | ICD-10-CM | POA: Diagnosis not present

## 2016-01-27 DIAGNOSIS — R0602 Shortness of breath: Secondary | ICD-10-CM | POA: Diagnosis not present

## 2016-01-27 DIAGNOSIS — R079 Chest pain, unspecified: Secondary | ICD-10-CM | POA: Diagnosis not present

## 2016-01-27 DIAGNOSIS — R002 Palpitations: Secondary | ICD-10-CM | POA: Diagnosis not present

## 2016-02-04 DIAGNOSIS — I208 Other forms of angina pectoris: Secondary | ICD-10-CM | POA: Diagnosis not present

## 2016-02-04 DIAGNOSIS — I6523 Occlusion and stenosis of bilateral carotid arteries: Secondary | ICD-10-CM | POA: Diagnosis not present

## 2016-02-04 DIAGNOSIS — I471 Supraventricular tachycardia: Secondary | ICD-10-CM | POA: Diagnosis not present

## 2016-02-04 DIAGNOSIS — I10 Essential (primary) hypertension: Secondary | ICD-10-CM | POA: Diagnosis not present

## 2016-02-22 DIAGNOSIS — E1121 Type 2 diabetes mellitus with diabetic nephropathy: Secondary | ICD-10-CM | POA: Diagnosis not present

## 2016-02-22 DIAGNOSIS — D519 Vitamin B12 deficiency anemia, unspecified: Secondary | ICD-10-CM | POA: Diagnosis not present

## 2016-02-22 DIAGNOSIS — D649 Anemia, unspecified: Secondary | ICD-10-CM | POA: Diagnosis not present

## 2016-02-24 DIAGNOSIS — E785 Hyperlipidemia, unspecified: Secondary | ICD-10-CM | POA: Diagnosis not present

## 2016-02-24 DIAGNOSIS — E0590 Thyrotoxicosis, unspecified without thyrotoxic crisis or storm: Secondary | ICD-10-CM | POA: Diagnosis not present

## 2016-02-24 DIAGNOSIS — E119 Type 2 diabetes mellitus without complications: Secondary | ICD-10-CM | POA: Diagnosis not present

## 2016-02-24 DIAGNOSIS — Z79899 Other long term (current) drug therapy: Secondary | ICD-10-CM | POA: Diagnosis not present

## 2016-02-24 DIAGNOSIS — I10 Essential (primary) hypertension: Secondary | ICD-10-CM | POA: Diagnosis not present

## 2016-02-24 DIAGNOSIS — D649 Anemia, unspecified: Secondary | ICD-10-CM | POA: Diagnosis not present

## 2016-02-29 DIAGNOSIS — K449 Diaphragmatic hernia without obstruction or gangrene: Secondary | ICD-10-CM | POA: Diagnosis not present

## 2016-02-29 DIAGNOSIS — K5730 Diverticulosis of large intestine without perforation or abscess without bleeding: Secondary | ICD-10-CM | POA: Diagnosis not present

## 2016-02-29 DIAGNOSIS — K209 Esophagitis, unspecified: Secondary | ICD-10-CM | POA: Diagnosis not present

## 2016-02-29 DIAGNOSIS — K208 Other esophagitis: Secondary | ICD-10-CM | POA: Diagnosis not present

## 2016-02-29 DIAGNOSIS — D179 Benign lipomatous neoplasm, unspecified: Secondary | ICD-10-CM | POA: Diagnosis not present

## 2016-02-29 DIAGNOSIS — D509 Iron deficiency anemia, unspecified: Secondary | ICD-10-CM | POA: Diagnosis not present

## 2016-03-01 DIAGNOSIS — E1142 Type 2 diabetes mellitus with diabetic polyneuropathy: Secondary | ICD-10-CM | POA: Diagnosis not present

## 2016-03-14 DIAGNOSIS — H40013 Open angle with borderline findings, low risk, bilateral: Secondary | ICD-10-CM | POA: Diagnosis not present

## 2016-03-14 DIAGNOSIS — H2513 Age-related nuclear cataract, bilateral: Secondary | ICD-10-CM | POA: Diagnosis not present

## 2016-03-14 DIAGNOSIS — E119 Type 2 diabetes mellitus without complications: Secondary | ICD-10-CM | POA: Diagnosis not present

## 2016-03-14 DIAGNOSIS — H25013 Cortical age-related cataract, bilateral: Secondary | ICD-10-CM | POA: Diagnosis not present

## 2016-04-06 DIAGNOSIS — D179 Benign lipomatous neoplasm, unspecified: Secondary | ICD-10-CM | POA: Insufficient documentation

## 2016-04-10 DIAGNOSIS — D509 Iron deficiency anemia, unspecified: Secondary | ICD-10-CM | POA: Diagnosis not present

## 2016-04-18 DIAGNOSIS — G478 Other sleep disorders: Secondary | ICD-10-CM | POA: Diagnosis not present

## 2016-04-18 DIAGNOSIS — I10 Essential (primary) hypertension: Secondary | ICD-10-CM | POA: Diagnosis not present

## 2016-04-18 DIAGNOSIS — G4730 Sleep apnea, unspecified: Secondary | ICD-10-CM | POA: Diagnosis not present

## 2016-04-18 DIAGNOSIS — E669 Obesity, unspecified: Secondary | ICD-10-CM | POA: Diagnosis not present

## 2016-04-18 DIAGNOSIS — G4733 Obstructive sleep apnea (adult) (pediatric): Secondary | ICD-10-CM | POA: Diagnosis not present

## 2016-04-24 ENCOUNTER — Other Ambulatory Visit: Payer: Self-pay | Admitting: Obstetrics and Gynecology

## 2016-04-24 DIAGNOSIS — Z1231 Encounter for screening mammogram for malignant neoplasm of breast: Secondary | ICD-10-CM

## 2016-05-04 ENCOUNTER — Other Ambulatory Visit: Payer: Medicare Other

## 2016-05-05 ENCOUNTER — Ambulatory Visit
Admission: RE | Admit: 2016-05-05 | Discharge: 2016-05-05 | Disposition: A | Discharge status: Home / Self Care | Payer: Medicare Other | Source: Ambulatory Visit | Attending: Internal Medicine | Admitting: Internal Medicine

## 2016-05-05 DIAGNOSIS — E0500 Thyrotoxicosis with diffuse goiter without thyrotoxic crisis or storm: Secondary | ICD-10-CM

## 2016-05-05 DIAGNOSIS — E042 Nontoxic multinodular goiter: Secondary | ICD-10-CM | POA: Diagnosis not present

## 2016-05-29 DIAGNOSIS — I10 Essential (primary) hypertension: Secondary | ICD-10-CM | POA: Diagnosis not present

## 2016-05-29 DIAGNOSIS — E0590 Thyrotoxicosis, unspecified without thyrotoxic crisis or storm: Secondary | ICD-10-CM | POA: Diagnosis not present

## 2016-05-29 DIAGNOSIS — D649 Anemia, unspecified: Secondary | ICD-10-CM | POA: Diagnosis not present

## 2016-05-29 DIAGNOSIS — E119 Type 2 diabetes mellitus without complications: Secondary | ICD-10-CM | POA: Diagnosis not present

## 2016-05-29 DIAGNOSIS — E785 Hyperlipidemia, unspecified: Secondary | ICD-10-CM | POA: Diagnosis not present

## 2016-06-01 DIAGNOSIS — E119 Type 2 diabetes mellitus without complications: Secondary | ICD-10-CM | POA: Diagnosis not present

## 2016-06-01 DIAGNOSIS — E785 Hyperlipidemia, unspecified: Secondary | ICD-10-CM | POA: Diagnosis not present

## 2016-06-01 DIAGNOSIS — I10 Essential (primary) hypertension: Secondary | ICD-10-CM | POA: Diagnosis not present

## 2016-06-01 DIAGNOSIS — E1121 Type 2 diabetes mellitus with diabetic nephropathy: Secondary | ICD-10-CM | POA: Diagnosis not present

## 2016-06-07 ENCOUNTER — Ambulatory Visit: Payer: Medicare Other

## 2016-06-07 DIAGNOSIS — E1121 Type 2 diabetes mellitus with diabetic nephropathy: Secondary | ICD-10-CM | POA: Diagnosis not present

## 2016-06-09 ENCOUNTER — Ambulatory Visit
Admission: RE | Admit: 2016-06-09 | Discharge: 2016-06-09 | Disposition: A | Discharge status: Home / Self Care | Payer: Medicare Other | Source: Ambulatory Visit | Attending: Obstetrics and Gynecology | Admitting: Obstetrics and Gynecology

## 2016-06-09 DIAGNOSIS — Z1231 Encounter for screening mammogram for malignant neoplasm of breast: Secondary | ICD-10-CM

## 2016-06-09 HISTORY — DX: Personal history of irradiation: Z92.3

## 2016-06-19 DIAGNOSIS — E1151 Type 2 diabetes mellitus with diabetic peripheral angiopathy without gangrene: Secondary | ICD-10-CM | POA: Diagnosis not present

## 2016-06-19 DIAGNOSIS — L603 Nail dystrophy: Secondary | ICD-10-CM | POA: Diagnosis not present

## 2016-06-19 DIAGNOSIS — I739 Peripheral vascular disease, unspecified: Secondary | ICD-10-CM | POA: Diagnosis not present

## 2016-06-21 DIAGNOSIS — M1711 Unilateral primary osteoarthritis, right knee: Secondary | ICD-10-CM | POA: Diagnosis not present

## 2016-06-21 DIAGNOSIS — M1712 Unilateral primary osteoarthritis, left knee: Secondary | ICD-10-CM | POA: Diagnosis not present

## 2016-06-21 DIAGNOSIS — M25562 Pain in left knee: Secondary | ICD-10-CM | POA: Diagnosis not present

## 2016-06-21 DIAGNOSIS — M25561 Pain in right knee: Secondary | ICD-10-CM | POA: Diagnosis not present

## 2016-07-10 DIAGNOSIS — Z6831 Body mass index (BMI) 31.0-31.9, adult: Secondary | ICD-10-CM | POA: Diagnosis not present

## 2016-07-10 DIAGNOSIS — I129 Hypertensive chronic kidney disease with stage 1 through stage 4 chronic kidney disease, or unspecified chronic kidney disease: Secondary | ICD-10-CM | POA: Diagnosis not present

## 2016-08-09 ENCOUNTER — Other Ambulatory Visit: Payer: Self-pay | Admitting: Endocrinology

## 2016-08-09 DIAGNOSIS — E041 Nontoxic single thyroid nodule: Secondary | ICD-10-CM

## 2016-08-29 DIAGNOSIS — H40013 Open angle with borderline findings, low risk, bilateral: Secondary | ICD-10-CM | POA: Diagnosis not present

## 2016-08-29 DIAGNOSIS — H524 Presbyopia: Secondary | ICD-10-CM | POA: Diagnosis not present

## 2016-09-18 DIAGNOSIS — E0590 Thyrotoxicosis, unspecified without thyrotoxic crisis or storm: Secondary | ICD-10-CM | POA: Diagnosis not present

## 2016-09-18 DIAGNOSIS — E049 Nontoxic goiter, unspecified: Secondary | ICD-10-CM | POA: Diagnosis not present

## 2016-10-09 DIAGNOSIS — E785 Hyperlipidemia, unspecified: Secondary | ICD-10-CM | POA: Diagnosis not present

## 2016-10-09 DIAGNOSIS — Z0000 Encounter for general adult medical examination without abnormal findings: Secondary | ICD-10-CM | POA: Diagnosis not present

## 2016-10-09 DIAGNOSIS — E119 Type 2 diabetes mellitus without complications: Secondary | ICD-10-CM | POA: Diagnosis not present

## 2016-10-09 DIAGNOSIS — I10 Essential (primary) hypertension: Secondary | ICD-10-CM | POA: Diagnosis not present

## 2016-10-09 DIAGNOSIS — D649 Anemia, unspecified: Secondary | ICD-10-CM | POA: Diagnosis not present

## 2016-10-09 DIAGNOSIS — E1142 Type 2 diabetes mellitus with diabetic polyneuropathy: Secondary | ICD-10-CM | POA: Diagnosis not present

## 2016-10-13 DIAGNOSIS — R42 Dizziness and giddiness: Secondary | ICD-10-CM | POA: Diagnosis not present

## 2016-10-13 DIAGNOSIS — K219 Gastro-esophageal reflux disease without esophagitis: Secondary | ICD-10-CM | POA: Diagnosis not present

## 2016-10-13 DIAGNOSIS — G4733 Obstructive sleep apnea (adult) (pediatric): Secondary | ICD-10-CM | POA: Diagnosis not present

## 2016-10-13 DIAGNOSIS — D649 Anemia, unspecified: Secondary | ICD-10-CM | POA: Diagnosis not present

## 2016-10-19 DIAGNOSIS — R42 Dizziness and giddiness: Secondary | ICD-10-CM | POA: Diagnosis not present

## 2016-10-19 DIAGNOSIS — H8111 Benign paroxysmal vertigo, right ear: Secondary | ICD-10-CM | POA: Diagnosis not present

## 2016-10-30 DIAGNOSIS — R42 Dizziness and giddiness: Secondary | ICD-10-CM | POA: Diagnosis not present

## 2016-10-30 DIAGNOSIS — H8111 Benign paroxysmal vertigo, right ear: Secondary | ICD-10-CM | POA: Diagnosis not present

## 2016-11-01 DIAGNOSIS — D519 Vitamin B12 deficiency anemia, unspecified: Secondary | ICD-10-CM | POA: Diagnosis not present

## 2016-11-01 DIAGNOSIS — I10 Essential (primary) hypertension: Secondary | ICD-10-CM | POA: Diagnosis not present

## 2016-11-01 DIAGNOSIS — E1121 Type 2 diabetes mellitus with diabetic nephropathy: Secondary | ICD-10-CM | POA: Diagnosis not present

## 2016-11-01 DIAGNOSIS — E785 Hyperlipidemia, unspecified: Secondary | ICD-10-CM | POA: Diagnosis not present

## 2016-11-06 ENCOUNTER — Ambulatory Visit
Admission: RE | Admit: 2016-11-06 | Discharge: 2016-11-06 | Disposition: A | Discharge status: Home / Self Care | Payer: Medicare Other | Source: Ambulatory Visit | Attending: Endocrinology | Admitting: Endocrinology

## 2016-11-06 ENCOUNTER — Other Ambulatory Visit: Payer: Medicare Other

## 2016-11-06 DIAGNOSIS — H8111 Benign paroxysmal vertigo, right ear: Secondary | ICD-10-CM | POA: Diagnosis not present

## 2016-11-06 DIAGNOSIS — E041 Nontoxic single thyroid nodule: Secondary | ICD-10-CM

## 2016-11-06 DIAGNOSIS — R42 Dizziness and giddiness: Secondary | ICD-10-CM | POA: Diagnosis not present

## 2016-11-06 DIAGNOSIS — E042 Nontoxic multinodular goiter: Secondary | ICD-10-CM | POA: Diagnosis not present

## 2016-11-08 DIAGNOSIS — K219 Gastro-esophageal reflux disease without esophagitis: Secondary | ICD-10-CM | POA: Diagnosis not present

## 2016-11-27 DIAGNOSIS — E1121 Type 2 diabetes mellitus with diabetic nephropathy: Secondary | ICD-10-CM | POA: Diagnosis not present

## 2016-11-27 DIAGNOSIS — E785 Hyperlipidemia, unspecified: Secondary | ICD-10-CM | POA: Diagnosis not present

## 2016-11-27 DIAGNOSIS — E0590 Thyrotoxicosis, unspecified without thyrotoxic crisis or storm: Secondary | ICD-10-CM | POA: Diagnosis not present

## 2016-11-27 DIAGNOSIS — I10 Essential (primary) hypertension: Secondary | ICD-10-CM | POA: Diagnosis not present

## 2016-11-29 DIAGNOSIS — Z9989 Dependence on other enabling machines and devices: Secondary | ICD-10-CM | POA: Diagnosis not present

## 2016-11-29 DIAGNOSIS — G4733 Obstructive sleep apnea (adult) (pediatric): Secondary | ICD-10-CM | POA: Diagnosis not present

## 2016-11-29 DIAGNOSIS — I10 Essential (primary) hypertension: Secondary | ICD-10-CM | POA: Diagnosis not present

## 2016-11-29 DIAGNOSIS — J3489 Other specified disorders of nose and nasal sinuses: Secondary | ICD-10-CM | POA: Diagnosis not present

## 2016-12-06 DIAGNOSIS — K219 Gastro-esophageal reflux disease without esophagitis: Secondary | ICD-10-CM | POA: Diagnosis not present

## 2016-12-27 DIAGNOSIS — D649 Anemia, unspecified: Secondary | ICD-10-CM | POA: Diagnosis not present

## 2016-12-27 DIAGNOSIS — I10 Essential (primary) hypertension: Secondary | ICD-10-CM | POA: Diagnosis not present

## 2016-12-27 DIAGNOSIS — Z853 Personal history of malignant neoplasm of breast: Secondary | ICD-10-CM | POA: Diagnosis not present

## 2016-12-27 DIAGNOSIS — E1121 Type 2 diabetes mellitus with diabetic nephropathy: Secondary | ICD-10-CM | POA: Diagnosis not present

## 2016-12-27 DIAGNOSIS — E782 Mixed hyperlipidemia: Secondary | ICD-10-CM | POA: Diagnosis not present

## 2016-12-27 DIAGNOSIS — E118 Type 2 diabetes mellitus with unspecified complications: Secondary | ICD-10-CM | POA: Diagnosis not present

## 2016-12-27 DIAGNOSIS — E785 Hyperlipidemia, unspecified: Secondary | ICD-10-CM | POA: Diagnosis not present

## 2016-12-27 DIAGNOSIS — M1990 Unspecified osteoarthritis, unspecified site: Secondary | ICD-10-CM | POA: Diagnosis not present

## 2016-12-27 DIAGNOSIS — D519 Vitamin B12 deficiency anemia, unspecified: Secondary | ICD-10-CM | POA: Diagnosis not present

## 2017-01-08 DIAGNOSIS — Z23 Encounter for immunization: Secondary | ICD-10-CM | POA: Diagnosis not present

## 2017-01-23 DIAGNOSIS — Z6831 Body mass index (BMI) 31.0-31.9, adult: Secondary | ICD-10-CM | POA: Diagnosis not present

## 2017-01-23 DIAGNOSIS — E1129 Type 2 diabetes mellitus with other diabetic kidney complication: Secondary | ICD-10-CM | POA: Diagnosis not present

## 2017-01-23 DIAGNOSIS — N182 Chronic kidney disease, stage 2 (mild): Secondary | ICD-10-CM | POA: Diagnosis not present

## 2017-01-23 DIAGNOSIS — I129 Hypertensive chronic kidney disease with stage 1 through stage 4 chronic kidney disease, or unspecified chronic kidney disease: Secondary | ICD-10-CM | POA: Diagnosis not present

## 2017-01-30 ENCOUNTER — Encounter: Payer: Self-pay | Admitting: Internal Medicine

## 2017-01-30 ENCOUNTER — Ambulatory Visit (INDEPENDENT_AMBULATORY_CARE_PROVIDER_SITE_OTHER): Payer: Medicare Other | Admitting: Internal Medicine

## 2017-01-30 VITALS — BP 128/60 | HR 76 | Ht 62.0 in | Wt 165.4 lb

## 2017-01-30 DIAGNOSIS — R0602 Shortness of breath: Secondary | ICD-10-CM | POA: Diagnosis not present

## 2017-01-30 DIAGNOSIS — Z9189 Other specified personal risk factors, not elsewhere classified: Secondary | ICD-10-CM

## 2017-01-30 DIAGNOSIS — R911 Solitary pulmonary nodule: Secondary | ICD-10-CM | POA: Diagnosis not present

## 2017-01-30 DIAGNOSIS — R0600 Dyspnea, unspecified: Secondary | ICD-10-CM | POA: Diagnosis not present

## 2017-01-30 DIAGNOSIS — R0689 Other abnormalities of breathing: Secondary | ICD-10-CM | POA: Diagnosis not present

## 2017-01-30 NOTE — Progress Notes (Signed)
Subjective:     Patient ID: Karen West, female   DOB: [birth year], 76 y.o.   MRN: [id number]  HPI Followup LUL  Pulmonary nodule in 76 year old ex-passive smoker with prior breast cancer. First noted 08/14/2008  as  incidental finding on a [facility] reserach study scan. Confirmed as unchanged 12/07/2008 (4th month scan). Unchanged on followup CT  03/08/2009 but also present was subcentimeter mediastinal nodes (7th month scan). Had another followup CT chest 06/23/2009 (11th  month scan). This showed enlargement of the LUL nodule to 1.3cm (in retrospect dec 2010 nodule measured at 1.2cm and therefore really unchanged) but unchanged  mediastoinal nodes. PET scan done 07/07/2009 shows NO UPTAKE in mediastinum or LUL nodule.   OV 09/09/2010: Had CT 02/25/2010 shows LUL nodule was unchanged. Had repeat Ct chest today (2 year CT chest) - official report pending. To me, the nodule looks unchanged. There are no new abnormalities.  In terms of symptoms she is asymptomatic. No complaints.   IOV 01/30/2017  Chief Complaint  Patient presents with  . Pulmonary Consult    Self-referral,sob with exertion occass.,denies wheezing or cough    76 year old female here for new evaluation consult for shortness of breath.  I did originally seen her in 2012 over 3 years ago for evaluation of left upper lobe nodule greater than 1 cm.  She is a passive smoker though not an active smoker.  She tells me for the last few months he has had insidious onset of shortness of breath that is new.  It is stable since onset.  It is extremely mild.  She only notices it when she walks a few miles on stable level ground.  She does not know if climbing steps exacerbate shortness of breath because she does not climb steps.  She definitely does not notice it for activities of daily living. Walking desaturation test on 01/30/2017 185 feet x 3 laps with forehead probe:  did   NOT desaturate. Rest pulse ox was 100%, final pulse ox was 98%. HR response  79/min at rest to 124/min at peak exertion and became tachycardic however she did not complain of shortness of breath.  She tells me that within the last 1 year with Dr. Einar Gip she had a cardiac stress test and this was normal.  Evaluation of echocardiogram done in the early 2000 shows ejection fraction 65% and a slight elevation in pulmonary artery systolic pressure.  She definitely denies any orthopnea proximal nocturnal dyspnea wheezing or cough or chest pain  She is obese and is a candidate for diastolic dysfunction.  She does have a breast cancer history    has a past medical history of Atherosclerosis of aorta ([place]), Breast cancer ([place]), Carotid artery disease ([place]), Chronic anemia, Chronic headaches, GERD (gastroesophageal reflux disease), Hyperlipidemia, Hypertension, Multinodular goiter, Obesity, OSA on CPAP, Osteoarthritis, Personal history of radiation therapy (1999), Right bundle branch block, and Type II or unspecified type diabetes mellitus without mention of complication, uncontrolled.   reports that  has never smoked. she has never used smokeless tobacco.  Past Surgical History:  Procedure Laterality Date  . BREAST BIOPSY  1993; 1995; 2000   left; right; left  . MASTECTOMY  2000   left  . TUBAL LIGATION  1968    Allergies  Allergen Reactions  . Tramadol Nausea And Vomiting and Other (See Comments)    "got sick to my stomach and was throwing up blood; it put me in the hospital")  . Tapazole [Methimazole] Itching  and Rash    Immunization History  Administered Date(s) Administered  . Influenza-Unspecified 01/16/2017  . Pneumococcal-Unspecified 11/19/2007    Family History  Problem Relation Age of Onset  . Diabetes Brother        x3  . Hypertension Brother        x3  . Diabetes Brother   . Stroke Brother   . Heart attack Mother 32       Mother Died of MI age 50  . Diabetes Sister   . Stroke Sister      Current Outpatient Medications:  .  amLODipine  (NORVASC) 10 MG tablet, Take 10 mg by mouth daily., Disp: , Rfl:  .  aspirin 81 MG tablet, Take 81 mg by mouth daily.  , Disp: , Rfl:  .  ezetimibe (ZETIA) 10 MG tablet, Take 10 mg daily by mouth. Take 1/2 tablet daily., Disp: , Rfl:  .  glipiZIDE (GLUCOTROL) 10 MG 24 hr tablet, Take 10 mg by mouth 2 (two) times daily.  , Disp: , Rfl:  .  hydrochlorothiazide (HYDRODIURIL) 12.5 MG tablet, Take 1 tablet by mouth daily, Disp: , Rfl:  .  metFORMIN (GLUCOPHAGE) 1000 MG tablet, 1,000 mg. Take by mouth two times a day, Disp: , Rfl:  .  Omega-3 Fatty Acids (FISH OIL) 1200 MG CAPS, Take 1 capsule by mouth daily., Disp: , Rfl:  .  omeprazole (PRILOSEC) 20 MG capsule, Take 20 mg by mouth daily.  , Disp: , Rfl:  .  rosuvastatin (CRESTOR) 20 MG tablet, Take 20 mg by mouth daily., Disp: , Rfl:  .  naproxen sodium (ANAPROX) 220 MG tablet, Take 440 mg by mouth 2 (two) times daily as needed. For pain, Disp: , Rfl:    Review of Systems     Objective:   Physical Exam  Constitutional: She is oriented to person, place, and time. She appears well-developed and well-nourished. No distress.  Obese  HENT:  Head: Normocephalic and atraumatic.  Right Ear: External ear normal.  Left Ear: External ear normal.  Mouth/Throat: Oropharynx is clear and moist. No oropharyngeal exudate.  Eyes: Conjunctivae and EOM are normal. Pupils are equal, round, and reactive to light. Right eye exhibits no discharge. Left eye exhibits no discharge. No scleral icterus.  Neck: Normal range of motion. Neck supple. No JVD present. No tracheal deviation present. No thyromegaly present.  Cardiovascular: Normal rate, regular rhythm and intact distal pulses. Exam reveals no gallop and no friction rub.  Murmur heard. Pulmonary/Chest: Effort normal and breath sounds normal. No respiratory distress. She has no wheezes. She has no rales. She exhibits no tenderness.  Abdominal: Soft. Bowel sounds are normal. She exhibits no distension and no mass.  There is no tenderness. There is no rebound and no guarding.  Musculoskeletal: Normal range of motion. She exhibits no edema or tenderness.  Lymphadenopathy:    She has no cervical adenopathy.  Neurological: She is alert and oriented to person, place, and time. She has normal reflexes. No cranial nerve deficit. She exhibits normal muscle tone. Coordination normal.  Skin: Skin is warm and dry. No rash noted. She is not diaphoretic. No erythema. No pallor.  Psychiatric: She has a normal mood and affect. Her behavior is normal. Judgment and thought content normal.  Vitals reviewed.   Vitals:   01/30/17 1003  BP: 128/60  Pulse: 76  SpO2: 98%  Weight: 165 lb 6.4 oz (75 kg)  Height: 5\' 2"  (1.575 m)    Estimated body mass  index is 30.25 kg/m as calculated from the following:   Height as of this encounter: 5\' 2"  (1.575 m).   Weight as of this encounter: 165 lb 6.4 oz (75 kg).     Assessment:       ICD-10-CM   1. Dyspnea and respiratory abnormalities R06.00 Pulmonary function test   R06.89 CT Chest High Resolution  2. Nodule of upper lobe of left lung R91.1   3. At risk from passive smoking Z91.89   4. Shortness of breath R06.02 CT Chest High Resolution   Based on history it is most likely that she has diastolic dysfunction obesity and physical deconditioning related dyspnea.  Although with breast cancer history and passive smoking history she is at risk for interstitial lung disease especially with age over 5.  It is best these 2 differential diagnosis of sorted out    Plan:      Do full PFT Do HRCT Sign release to get echo/stress test records from Dr  Einar Gip  Followup  - return to see APP after above  - if these are non-revealing for shortness of breath, then we will do CPST bike test   Dr. Brand Males, M.D., [facility].C.P Pulmonary and Critical Care Medicine Staff Physician [place] [facility] Pager: [phone], If no answer or  between  15:00h - 7:00h: call [phone]  01/30/2017 10:37 AM

## 2017-01-30 NOTE — Patient Instructions (Addendum)
ICD-10-CM   1. Dyspnea and respiratory abnormalities R06.00    R06.89   2. Nodule of upper lobe of left lung R91.1   3. At risk from passive smoking Z91.89     Do full PFT Do HRCT Sign release to get echo/stress test records from Dr  Einar Gip  Followup  - return to see APP after above  - if these are non-revealing for shortness of breath, then we will do CPST bike test

## 2017-01-30 NOTE — Progress Notes (Signed)
   Subjective:    Patient ID: Karen West, female    DOB: 16-Feb-1941, 76 y.o.   MRN: [id number]  HPI    Review of Systems  Constitutional: Negative for fever and unexpected weight change.  HENT: Negative for congestion, dental problem, ear pain, nosebleeds, postnasal drip, rhinorrhea, sinus pressure, sneezing, sore throat and trouble swallowing.   Eyes: Negative for redness and itching.  Respiratory: Negative for cough, chest tightness, shortness of breath and wheezing.   Cardiovascular: Negative for palpitations and leg swelling.  Gastrointestinal: Negative for nausea and vomiting.  Genitourinary: Negative for dysuria.  Musculoskeletal: Negative for joint swelling.  Skin: Negative for rash.  Neurological: Negative for headaches.  Hematological: Does not bruise/bleed easily.  Psychiatric/Behavioral: Negative for dysphoric mood. The patient is nervous/anxious.        Objective:   Physical Exam        Assessment & Plan:

## 2017-02-06 ENCOUNTER — Ambulatory Visit (INDEPENDENT_AMBULATORY_CARE_PROVIDER_SITE_OTHER)
Admission: RE | Admit: 2017-02-06 | Discharge: 2017-02-06 | Disposition: A | Discharge status: Home / Self Care | Payer: Medicare Other | Source: Ambulatory Visit | Attending: Internal Medicine | Admitting: Internal Medicine

## 2017-02-06 DIAGNOSIS — R0689 Other abnormalities of breathing: Secondary | ICD-10-CM

## 2017-02-06 DIAGNOSIS — R911 Solitary pulmonary nodule: Secondary | ICD-10-CM | POA: Diagnosis not present

## 2017-02-06 DIAGNOSIS — E1121 Type 2 diabetes mellitus with diabetic nephropathy: Secondary | ICD-10-CM | POA: Diagnosis not present

## 2017-02-06 DIAGNOSIS — M1990 Unspecified osteoarthritis, unspecified site: Secondary | ICD-10-CM | POA: Diagnosis not present

## 2017-02-06 DIAGNOSIS — D519 Vitamin B12 deficiency anemia, unspecified: Secondary | ICD-10-CM | POA: Diagnosis not present

## 2017-02-06 DIAGNOSIS — Z853 Personal history of malignant neoplasm of breast: Secondary | ICD-10-CM | POA: Diagnosis not present

## 2017-02-06 DIAGNOSIS — E118 Type 2 diabetes mellitus with unspecified complications: Secondary | ICD-10-CM | POA: Diagnosis not present

## 2017-02-06 DIAGNOSIS — R0600 Dyspnea, unspecified: Secondary | ICD-10-CM | POA: Diagnosis not present

## 2017-02-06 DIAGNOSIS — R0602 Shortness of breath: Secondary | ICD-10-CM | POA: Diagnosis not present

## 2017-02-06 DIAGNOSIS — D649 Anemia, unspecified: Secondary | ICD-10-CM | POA: Diagnosis not present

## 2017-02-06 DIAGNOSIS — E782 Mixed hyperlipidemia: Secondary | ICD-10-CM | POA: Diagnosis not present

## 2017-02-06 DIAGNOSIS — I10 Essential (primary) hypertension: Secondary | ICD-10-CM | POA: Diagnosis not present

## 2017-02-06 DIAGNOSIS — E785 Hyperlipidemia, unspecified: Secondary | ICD-10-CM | POA: Diagnosis not present

## 2017-02-07 DIAGNOSIS — M25561 Pain in right knee: Secondary | ICD-10-CM | POA: Diagnosis not present

## 2017-02-07 DIAGNOSIS — M25562 Pain in left knee: Secondary | ICD-10-CM | POA: Diagnosis not present

## 2017-02-07 DIAGNOSIS — M170 Bilateral primary osteoarthritis of knee: Secondary | ICD-10-CM | POA: Diagnosis not present

## 2017-02-07 DIAGNOSIS — L603 Nail dystrophy: Secondary | ICD-10-CM | POA: Diagnosis not present

## 2017-02-07 DIAGNOSIS — I739 Peripheral vascular disease, unspecified: Secondary | ICD-10-CM | POA: Diagnosis not present

## 2017-02-07 DIAGNOSIS — E1151 Type 2 diabetes mellitus with diabetic peripheral angiopathy without gangrene: Secondary | ICD-10-CM | POA: Diagnosis not present

## 2017-02-12 DIAGNOSIS — Z6831 Body mass index (BMI) 31.0-31.9, adult: Secondary | ICD-10-CM | POA: Diagnosis not present

## 2017-02-12 DIAGNOSIS — R319 Hematuria, unspecified: Secondary | ICD-10-CM | POA: Diagnosis not present

## 2017-02-12 DIAGNOSIS — C50919 Malignant neoplasm of unspecified site of unspecified female breast: Secondary | ICD-10-CM | POA: Diagnosis not present

## 2017-02-12 DIAGNOSIS — R1032 Left lower quadrant pain: Secondary | ICD-10-CM | POA: Diagnosis not present

## 2017-02-12 DIAGNOSIS — N8189 Other female genital prolapse: Secondary | ICD-10-CM | POA: Diagnosis not present

## 2017-02-12 DIAGNOSIS — Z01419 Encounter for gynecological examination (general) (routine) without abnormal findings: Secondary | ICD-10-CM | POA: Diagnosis not present

## 2017-02-12 DIAGNOSIS — E049 Nontoxic goiter, unspecified: Secondary | ICD-10-CM | POA: Diagnosis not present

## 2017-02-12 DIAGNOSIS — M8580 Other specified disorders of bone density and structure, unspecified site: Secondary | ICD-10-CM | POA: Diagnosis not present

## 2017-02-12 DIAGNOSIS — R5383 Other fatigue: Secondary | ICD-10-CM | POA: Diagnosis not present

## 2017-02-13 ENCOUNTER — Telehealth: Payer: Self-pay | Admitting: Internal Medicine

## 2017-02-13 ENCOUNTER — Other Ambulatory Visit: Payer: Self-pay | Admitting: Internal Medicine

## 2017-02-13 DIAGNOSIS — R911 Solitary pulmonary nodule: Secondary | ICD-10-CM

## 2017-02-13 NOTE — Telephone Encounter (Signed)
Let Karen West know that  1.    IMPRESSION: 1. No evidence of interstitial lung disease.- will discuss at followup dec 2018. Good news 2. Enlarging left lobe of the thyroid with increased luminal narrowing of the trachea when compared with 09/09/2010. This may be the cause of the patient's symptoms.- this is important and she needs to visit with DR Chalmers Cater again. Please send this report to Dr Chalmers Cater. Review of chart shows Dr Chalmers Cater did do an [place] aug 2018. Patient might need surgery for this due to anatomic compression 3. Slight enlargement of a left upper lobe nodule. Difficult to exclude indolent adenocarcinoma.-> Get PET scan due to enlarging nodule and is > 1cm 4. Aortic atherosclerosis (ICD10-170.0). Severe coronary artery Calcification.- can explain dyspnea. When was last stress test or cardiology visit? If > 3 years, refer to cards   Electronically Signed   By: Lorin Picket M.D.   On: 02/07/2017 09:43

## 2017-02-13 NOTE — Telephone Encounter (Signed)
I will send copy of results to Dr. Chalmers Cater, patient has appointment with that office at the beginning of the month. I will order the PET scan and patient states she will await the call for scheduling it. Patient is still coming in for PFT and will also be seen at appointment with Dr. Melvyn Novas

## 2017-02-16 ENCOUNTER — Telehealth: Payer: Self-pay

## 2017-02-16 NOTE — Telephone Encounter (Signed)
Received patients Release via fax. Placed her medical records in mail to her home address.

## 2017-02-28 ENCOUNTER — Ambulatory Visit (HOSPITAL_COMMUNITY)
Admission: RE | Admit: 2017-02-28 | Discharge: 2017-02-28 | Disposition: A | Discharge status: Home / Self Care | Payer: Medicare Other | Source: Ambulatory Visit | Attending: Internal Medicine | Admitting: Internal Medicine

## 2017-02-28 DIAGNOSIS — E118 Type 2 diabetes mellitus with unspecified complications: Secondary | ICD-10-CM | POA: Diagnosis not present

## 2017-02-28 DIAGNOSIS — E042 Nontoxic multinodular goiter: Secondary | ICD-10-CM | POA: Diagnosis not present

## 2017-02-28 DIAGNOSIS — R911 Solitary pulmonary nodule: Secondary | ICD-10-CM | POA: Diagnosis not present

## 2017-02-28 DIAGNOSIS — R9389 Abnormal findings on diagnostic imaging of other specified body structures: Secondary | ICD-10-CM | POA: Diagnosis not present

## 2017-02-28 LAB — GLUCOSE, CAPILLARY: Glucose-Capillary: 82 mg/dL (ref 65–99)

## 2017-02-28 MED ORDER — FLUDEOXYGLUCOSE F - 18 (FDG) INJECTION
9.2300 | Freq: Once | INTRAVENOUS | Status: AC | PRN
  Administered 2017-02-28: 9.23 via INTRAVENOUS

## 2017-03-01 DIAGNOSIS — E1121 Type 2 diabetes mellitus with diabetic nephropathy: Secondary | ICD-10-CM | POA: Diagnosis not present

## 2017-03-01 DIAGNOSIS — E8809 Other disorders of plasma-protein metabolism, not elsewhere classified: Secondary | ICD-10-CM | POA: Diagnosis not present

## 2017-03-01 DIAGNOSIS — I10 Essential (primary) hypertension: Secondary | ICD-10-CM | POA: Diagnosis not present

## 2017-03-01 DIAGNOSIS — E119 Type 2 diabetes mellitus without complications: Secondary | ICD-10-CM | POA: Diagnosis not present

## 2017-03-01 DIAGNOSIS — H25013 Cortical age-related cataract, bilateral: Secondary | ICD-10-CM | POA: Diagnosis not present

## 2017-03-01 DIAGNOSIS — H40013 Open angle with borderline findings, low risk, bilateral: Secondary | ICD-10-CM | POA: Diagnosis not present

## 2017-03-01 DIAGNOSIS — H2513 Age-related nuclear cataract, bilateral: Secondary | ICD-10-CM | POA: Diagnosis not present

## 2017-03-05 ENCOUNTER — Ambulatory Visit (INDEPENDENT_AMBULATORY_CARE_PROVIDER_SITE_OTHER): Payer: Medicare Other | Admitting: Internal Medicine

## 2017-03-05 ENCOUNTER — Encounter: Payer: Self-pay | Admitting: Internal Medicine

## 2017-03-05 VITALS — BP 132/68 | HR 87 | Ht 62.0 in | Wt 165.0 lb

## 2017-03-05 DIAGNOSIS — R911 Solitary pulmonary nodule: Secondary | ICD-10-CM | POA: Diagnosis not present

## 2017-03-05 DIAGNOSIS — R0689 Other abnormalities of breathing: Secondary | ICD-10-CM

## 2017-03-05 DIAGNOSIS — R590 Localized enlarged lymph nodes: Secondary | ICD-10-CM

## 2017-03-05 DIAGNOSIS — E049 Nontoxic goiter, unspecified: Secondary | ICD-10-CM

## 2017-03-05 DIAGNOSIS — R0600 Dyspnea, unspecified: Secondary | ICD-10-CM

## 2017-03-05 DIAGNOSIS — E119 Type 2 diabetes mellitus without complications: Secondary | ICD-10-CM | POA: Diagnosis not present

## 2017-03-05 DIAGNOSIS — D649 Anemia, unspecified: Secondary | ICD-10-CM | POA: Diagnosis not present

## 2017-03-05 DIAGNOSIS — Z7982 Long term (current) use of aspirin: Secondary | ICD-10-CM | POA: Diagnosis not present

## 2017-03-05 DIAGNOSIS — I10 Essential (primary) hypertension: Secondary | ICD-10-CM | POA: Diagnosis not present

## 2017-03-05 NOTE — Progress Notes (Signed)
PFT done today. 

## 2017-03-05 NOTE — Patient Instructions (Signed)
Nodule of upper lobe of left lung Hilar adenopathy  - there is slow growth of lung nodule over years; cannot rule out lung cancer - refer DR Roxan Hockey for consideration of biopsy v resection - consider participation in blood test study for lung nodules  Goiter - there is concern that the thyroid is pressing on airway and causing compression - if so you might need surgery for this - will inform your PCP Deland Pretty, MD and endocrinologist Dr Chalmers Cater about this  Followup - 2 months or sooner if needed

## 2017-03-05 NOTE — Addendum Note (Signed)
Addended by: Rosana Berger on: 03/05/2017 11:53 AM   Modules accepted: Orders

## 2017-03-05 NOTE — Progress Notes (Signed)
Subjective:     Patient ID: Karen West, female   DOB: October 31, 1940, 76 y.o.   MRN: [id number]  HPI Followup LUL  Pulmonary nodule in 76 year old ex-passive smoker with prior breast cancer. First noted 08/14/2008  as  incidental finding on a [facility] reserach study scan. Confirmed as unchanged 12/07/2008 (4th month scan). Unchanged on followup CT  03/08/2009 but also present was subcentimeter mediastinal nodes (7th month scan). Had another followup CT chest 06/23/2009 (11th  month scan). This showed enlargement of the LUL nodule to 1.3cm (in retrospect dec 2010 nodule measured at 1.2cm and therefore really unchanged) but unchanged  mediastoinal nodes. PET scan done 07/07/2009 shows NO UPTAKE in mediastinum or LUL nodule.   OV 09/09/2010: Had CT 02/25/2010 shows LUL nodule was unchanged. Had repeat Ct chest today (2 year CT chest) - official report pending. To me, the nodule looks unchanged. There are no new abnormalities.  In terms of symptoms she is asymptomatic. No complaints.   IOV 01/30/2017  Chief Complaint  Patient presents with  . Pulmonary Consult    Self-referral,sob with exertion occass.,denies wheezing or cough    76 year old female here for new evaluation consult for shortness of breath.  I did originally seen her in 2012 over 3 years ago for evaluation of left upper lobe nodule greater than 1 cm.  She is a passive smoker though not an active smoker.  She tells me for the last few months he has had insidious onset of shortness of breath that is new.  It is stable since onset.  It is extremely mild.  She only notices it when she walks a few miles on stable level ground.  She does not know if climbing steps exacerbate shortness of breath because she does not climb steps.  She definitely does not notice it for activities of daily living. Walking desaturation test on 01/30/2017 185 feet x 3 laps with forehead probe:  did   NOT desaturate. Rest pulse ox was 100%, final pulse ox was 98%. HR response  79/min at rest to 124/min at peak exertion and became tachycardic however she did not complain of shortness of breath.  She tells me that within the last 1 year with Dr. Einar Gip she had a cardiac stress test and this was normal.  Evaluation of echocardiogram done in the early 2000 shows ejection fraction 65% and a slight elevation in pulmonary artery systolic pressure.  She definitely denies any orthopnea proximal nocturnal dyspnea wheezing or cough or chest pain  She is obese and is a candidate for diastolic dysfunction.  She does have a breast cancer history   OV 03/05/2017  Chief Complaint  Patient presents with  . Follow-up    PFT done today. CT scan 02/07/17 and PET scan 02/28/17.  Pt states that she has been okay since last visit. Denies any complaints or concerns.    Karen West [birth year] 76 y.o. female from [address] [place] [address] Is here to follow-up. She presented for dyspnea and the workup is revealed growth of the left upper lobe lung nodule for which she did a PET scan that shows low active uptake but growth there for low-grade malignancy cannot be ruled out. In terms of her shortness of breath she did have coronary artery calcification but she again tells me that within the last 1 year she's had cardiac stress test with Dr. Einar Gip  and this was normal. The CT scan of the chest and PET scan do  show an enlargement of the thyroid gland with compression of her airway. In talking to her she tells me that she's been biopsied of the thyroid gland at least 18 times with the last one being 9 years ago and the results of always been benign and there for she's been on observation. But she is not aware that her thyroid gland is causing compression. In fact she did vomit function test today and it shows significant flow volume loops suggesting that this anatomic compression could be real    IMPRESSION: CT chest 02/08/16 1. No evidence of interstitial lung disease.- will discuss at  followup dec 2018. Good news 2. Enlarging left lobe of the thyroid with increased luminal narrowing of the trachea when compared with 09/09/2010. This may be the cause of the patient's symptoms.- this is important and she needs to visit with DR Chalmers Cater again. Please send this report to Dr Chalmers Cater. Review of chart shows Dr Chalmers Cater did do an [place] aug 2018. Patient might need surgery for this due to anatomic compression 3. Slight enlargement of a left upper lobe nodule. Difficult to exclude indolent adenocarcinoma.-> Get PET scan due to enlarging nodule and is > 1cm 4. Aortic atherosclerosis (ICD10-170.0). Severe coronary artery Calcification.- can explain dyspnea. When was last stress test or cardiology visit? If > 3 years, refer to cards   Electronically Signed   By: Lorin Picket M.D.   On: 02/07/2017 09:43   PET scan 02/28/17 IMPRESSION: Slowly enlarging 1.7 cm left upper lobe pulmonary nodule with low-grade metabolic activity, suspicious for low-grade adenocarcinoma.  Mild FDG uptake in left hilum and 1 cm right paratracheal lymph node. Lymph node metastases cannot be excluded.  No evidence of metastatic disease within the neck, abdomen, or pelvis.  Multinodular goiter, with mild enlargement of 3.1 cm nodule in the left isthmus which shows hypermetabolic activity. Another focus of hypermetabolic activity is seen in the lateral right thyroid lobe. Thyroid carcinoma cannot be excluded. Recommend thyroid ultrasound for further evaluation.   Electronically Signed   By: Earle Gell M.D.   On: 02/28/2017 14:15   Review of Systems     Objective:   Physical Exam Vitals:   03/05/17 1114  BP: 132/68  Pulse: 87  SpO2: 99%  Weight: 165 lb (74.8 kg)  Height: 5\' 2"  (1.575 m)    Discussion only visit    Assessment:       ICD-10-CM   1. Nodule of upper lobe of left lung R91.1   2. Hilar adenopathy R59.0   3. Goiter E04.9        Plan:     Nodule of upper lobe of  left lung Hilar adenopathy  - there is slow growth of lung nodule over years; cannot rule out lung cancer - refer DR Roxan Hockey for consideration of biopsy v resection - consider participation in blood test study for lung nodules  Goiter - there is concern that the thyroid is pressing on airway and causing compression - if so you might need surgery for this - will inform your PCP Deland Pretty, MD and endocrinologist Dr Chalmers Cater about this  Followup - 2 months or sooner if needed  > 50% of this > 25 min visit spent in face to face counseling or coordination of care    We discussed these results when I'm not so sure that she comprehended everything but at the end it appeared that she seemed to get the gist of the following above issues.  Dr. Belva Crome  Chase Caller, M.D., F.C.C.P Pulmonary and Critical Care Medicine Staff Physician, [place] Director - Interstitial Lung Disease  Program  Pulmonary [place] [facility] [place], [place], [address]  Pager: [phone], If no answer or between  15:00h - 7:00h: call [phone] Telephone: [phone]

## 2017-03-14 DIAGNOSIS — E049 Nontoxic goiter, unspecified: Secondary | ICD-10-CM | POA: Diagnosis not present

## 2017-03-14 DIAGNOSIS — N8189 Other female genital prolapse: Secondary | ICD-10-CM | POA: Diagnosis not present

## 2017-03-14 DIAGNOSIS — R1032 Left lower quadrant pain: Secondary | ICD-10-CM | POA: Diagnosis not present

## 2017-03-16 DIAGNOSIS — R42 Dizziness and giddiness: Secondary | ICD-10-CM | POA: Diagnosis not present

## 2017-03-16 DIAGNOSIS — E049 Nontoxic goiter, unspecified: Secondary | ICD-10-CM | POA: Diagnosis not present

## 2017-03-16 DIAGNOSIS — R911 Solitary pulmonary nodule: Secondary | ICD-10-CM | POA: Diagnosis not present

## 2017-03-16 DIAGNOSIS — M25512 Pain in left shoulder: Secondary | ICD-10-CM | POA: Diagnosis not present

## 2017-03-16 DIAGNOSIS — G8929 Other chronic pain: Secondary | ICD-10-CM | POA: Diagnosis not present

## 2017-03-19 ENCOUNTER — Encounter: Payer: Self-pay | Admitting: Thoracic Surgery (Cardiothoracic Vascular Surgery)

## 2017-03-19 ENCOUNTER — Other Ambulatory Visit: Payer: Self-pay

## 2017-03-19 ENCOUNTER — Institutional Professional Consult (permissible substitution) (INDEPENDENT_AMBULATORY_CARE_PROVIDER_SITE_OTHER): Payer: Medicare Other | Admitting: Thoracic Surgery (Cardiothoracic Vascular Surgery)

## 2017-03-19 VITALS — BP 137/69 | HR 90 | Resp 16 | Ht 62.0 in | Wt 161.0 lb

## 2017-03-19 DIAGNOSIS — D381 Neoplasm of uncertain behavior of trachea, bronchus and lung: Secondary | ICD-10-CM | POA: Diagnosis not present

## 2017-03-19 DIAGNOSIS — E041 Nontoxic single thyroid nodule: Secondary | ICD-10-CM | POA: Diagnosis not present

## 2017-03-19 DIAGNOSIS — E0590 Thyrotoxicosis, unspecified without thyrotoxic crisis or storm: Secondary | ICD-10-CM | POA: Diagnosis not present

## 2017-03-19 NOTE — Progress Notes (Signed)
PCP is Deland Pretty, MD Referring Provider is Brand Males, MD  Chief Complaint  Patient presents with  . Lung Lesion    LULobe...CT CHEST 02/06/17, PET 02/28/17, PFT 03/05/17    HPI: Mrs. Paulding is sent for evaluation of the left upper lobe lung nodule.  Ms. Blystone is an 76 year old woman with a past medical history significant for multinodular goiter, hypertension, hyperlipidemia, reflux, breast cancer status post left mastectomy, obstructive sleep apnea on CPAP, type 2 diabetes, right bundle branch block, carotid artery disease, and aortic atherosclerosis.  She is a lifelong non-smoker.  She was first found to have a nodule back in 2010.  This was an incidental finding on the scan that was done for research study.  She had several follow-up CTs over the next couple of years and there was questionable growth from scan to scan but overall was felt to be stable.  She then was lost to follow-up.  She was referred to Dr. Chase Caller in November for evaluation of shortness of breath.  Her shortness of breath was relatively minor only occurring when she would walk over a mile on level ground.  She was not having any shortness of breath with ADLs.  A walking desaturation test showed 3 laps in the 185 feet with no evidence of desaturation.  She was not symptomatic with the test.  She says she saw Dr. Einar Gip about 5 or 6 months ago and was told she did not have any heart issues.  Dr. Chase Caller ordered a CT and then a PET/CT.  The CT showed that the left upper lobe lung nodule had increased in size since her most recent one in 2012.  On PET had a low-grade metabolic activity.  She also was noted to have a large multinodular goiter with a 3.1 cm nodule in the left isthmus that showed an increased size from 2012.  There was metabolic activity with an SUV max of 5.2.  Her lung nodule was 1.7 cm with an SUV of 2.1.  She also had left hilar and mediastinal adenopathy that was mildly hypermetabolic.  She  denies any stridor or wheezing.  To me she denies shortness of breath but obviously did see Dr. Chase Caller for that complaint.  She says that she had shortness of breath at rest and was admitted to the hospital couple of times about 7 years ago but they never found any heart problems.  She is a lifelong non-smoker but her husband did smoke for many years.  She has vertigo.  She has sleep apnea and uses CPAP at night.  She denies any change in appetite or weight loss.  Zubrod Score: At the time of surgery this patient's most appropriate activity status/level should be described as: [x]     0    Normal activity, no symptoms []     1    Restricted in physical strenuous activity but ambulatory, able to do out light work []     2    Ambulatory and capable of self care, unable to do work activities, up and about >50 % of waking hours                              []     3    Only limited self care, in bed greater than 50% of waking hours []     4    Completely disabled, no self care, confined to bed or chair []   5    Moribund  Past Medical History:  Diagnosis Date  . Atherosclerosis of aorta ([place])   . Breast cancer ([place])    "cancer in both breasts; s/p left mastectomy  . Carotid artery disease ([place])    09/3708 w/ 62-69% LICA stenosis and <48% RICA stenosis  . Chronic anemia   . Chronic headaches   . GERD (gastroesophageal reflux disease)   . Hyperlipidemia   . Hypertension   . Multinodular goiter   . Obesity   . OSA on CPAP   . Osteoarthritis   . Personal history of radiation therapy 1999  . Right bundle branch block   . Type II or unspecified type diabetes mellitus without mention of complication, uncontrolled     Past Surgical History:  Procedure Laterality Date  . BREAST BIOPSY  1993; 1995; 2000   left; right; left  . MASTECTOMY  2000   left  . TUBAL LIGATION  1968    Family History  Problem Relation Age of Onset  . Diabetes Brother        x3  . Hypertension Brother        x3  .  Diabetes Brother   . Stroke Brother   . Heart attack Mother 59       Mother Died of MI age 52  . Diabetes Sister   . Stroke Sister     Social History Social History   Tobacco Use  . Smoking status: Never Smoker  . Smokeless tobacco: Never Used  Substance Use Topics  . Alcohol use: Yes    Comment: 10/04/11 "drink a wine or beeer 3-4 times/year"  . Drug use: No    Current Outpatient Medications  Medication Sig Dispense Refill  . amLODipine (NORVASC) 10 MG tablet Take 10 mg by mouth daily.    Marland Kitchen aspirin 81 MG tablet Take 81 mg by mouth daily.      . Cholecalciferol (VITAMIN D PO) Vitamin D  600 mg daily    . ezetimibe (ZETIA) 10 MG tablet Take 10 mg daily by mouth. Take 1/2 tablet daily.    Marland Kitchen glipiZIDE (GLUCOTROL) 10 MG 24 hr tablet Take 10 mg by mouth 2 (two) times daily.      . hydrochlorothiazide (HYDRODIURIL) 12.5 MG tablet Take 1 tablet by mouth daily    . metFORMIN (GLUCOPHAGE) 1000 MG tablet 1,000 mg. Take by mouth two times a day    . Omega-3 Fatty Acids (FISH OIL) 1200 MG CAPS Take 1 capsule by mouth daily.    Marland Kitchen omeprazole (PRILOSEC) 20 MG capsule Take 20 mg by mouth daily.      . rosuvastatin (CRESTOR) 20 MG tablet Take 20 mg by mouth daily.     No current facility-administered medications for this visit.     Allergies  Allergen Reactions  . Tramadol Nausea And Vomiting and Other (See Comments)    "got sick to my stomach and was throwing up blood; it put me in the hospital")  . Tapazole [Methimazole] Itching and Rash    Review of Systems  Constitutional: Negative for appetite change, fatigue and unexpected weight change.  HENT: Negative for trouble swallowing and voice change.   Eyes: Negative for visual disturbance.  Respiratory: Positive for shortness of breath. Negative for cough, chest tightness, wheezing and stridor.   Cardiovascular: Negative for chest pain, palpitations and leg swelling.  Gastrointestinal: Positive for abdominal pain (Reflux).   Genitourinary: Negative for difficulty urinating and dysuria.  Musculoskeletal: Positive for arthralgias, gait  problem and joint swelling.  Skin: Positive for rash.  Neurological: Positive for dizziness. Negative for syncope and weakness.  All other systems reviewed and are negative.   BP 137/69 (BP Location: Right Arm, Patient Position: Sitting, Cuff Size: Large)   Pulse 90   Resp 16   Ht 5\' 2"  (1.575 m)   Wt 161 lb (73 kg)   SpO2 98% Comment: ON RA  BMI 29.45 kg/m  Physical Exam  Constitutional: She is oriented to person, place, and time. She appears well-developed and well-nourished. No distress.  HENT:  Head: Normocephalic and atraumatic.  Mouth/Throat: No oropharyngeal exudate.  Eyes: Conjunctivae and EOM are normal. No scleral icterus.  Neck: Neck supple. Tracheal deviation present. Thyromegaly present.  Cardiovascular: Normal rate, regular rhythm and intact distal pulses. Exam reveals no gallop and no friction rub.  Murmur (2/6 systolic) heard. Pulmonary/Chest: Effort normal and breath sounds normal. No stridor. No respiratory distress. She has no wheezes. She has no rales.  Abdominal: Soft. She exhibits no distension. There is no tenderness.  Musculoskeletal: She exhibits no edema.  Lymphadenopathy:    She has no cervical adenopathy.  Neurological: She is alert and oriented to person, place, and time. No cranial nerve deficit. She exhibits normal muscle tone.  Skin: Skin is warm and dry.  Vitals reviewed.    Diagnostic Tests: CT CHEST WITHOUT CONTRAST  TECHNIQUE: Multidetector CT imaging of the chest was performed following the standard protocol without intravenous contrast. High resolution imaging of the lungs, as well as inspiratory and expiratory imaging, was performed.  COMPARISON:  09/09/2010.  FINDINGS: Cardiovascular: Atherosclerotic calcification of the arterial vasculature, including severe involvement of the coronary arteries. Heart is enlarged.  No pericardial effusion.  Mediastinum/Nodes: Left lobe of the thyroid is heterogeneous, partially calcified and enlarged, with rightward shift and left narrowing of the trachea. Luminal diameter decreases to 6 x 12 mm, previously 13 x 19 mm on 09/09/2010. Mediastinal lymph nodes measure up to 12 mm in the low right paratracheal station, as before. Hilar regions are difficult to evaluate without IV contrast. Surgical clips in the axillary regions. No axillary adenopathy. Esophagus is grossly unremarkable.  Lungs/Pleura: Image quality is degraded by expiratory phase imaging. Posterior left upper lobe nodule has increased in size, now measuring 1.2 x 1.4 cm (series 3, image 41), previously 1.0 x 1.1 cm. Negative for subpleural reticulation, traction bronchiectasis/bronchiolectasis, ground-glass, architectural distortion or honeycombing. Mild emphysema. 3 mm peripheral left upper lobe nodule, nonspecific. No pleural fluid. Airway is otherwise unremarkable. Inspiratory phase is in expiration, limiting evaluation for air trapping.  Upper Abdomen: Visualized portions of the liver, gallbladder, adrenal glands, kidneys, spleen, pancreas, stomach and bowel are grossly unremarkable.  Musculoskeletal: Degenerative changes in the spine. No worrisome lytic or sclerotic lesions. Flowing anterior osteophytosis in the thoracic spine.  IMPRESSION: 1. No evidence of interstitial lung disease. 2. Enlarging left lobe of the thyroid with increased luminal narrowing of the trachea when compared with 09/09/2010. This may be the cause of the patient's symptoms. 3. Slight enlargement of a left upper lobe nodule. Difficult to exclude indolent adenocarcinoma. 4. Aortic atherosclerosis (ICD10-170.0). Severe coronary artery calcification.   Electronically Signed   By: Lorin Picket M.D.   On: 02/07/2017 09:43  NUCLEAR MEDICINE PET SKULL BASE TO THIGH  TECHNIQUE: 9.2 mCi F-18 FDG was  injected intravenously. Full-ring PET imaging was performed from the skull base to thigh after the radiotracer. CT data was obtained and used for attenuation correction and anatomic localization.  FASTING BLOOD GLUCOSE:  Value: 82 mg/dl  COMPARISON:  Chest CT on 09/09/2010  FINDINGS: NECK: No hypermetabolic lymph nodes identified. Multinodular goiter again seen. A 3.1 cm nodule in the left isthmus shows increased size from approximately 2.2 cm in 2012. This shows internal hypermetabolic activity, with SUV max of 5.2. There is also a small focus of hypermetabolic activity seen in lateral right lobe with SUV max of 3.8.  CHEST: A 1.7 cm irregular pulmonary nodule in the posterior left upper lobe (image 25/8) is increased in size from 1.3 cm in 2012. This shows low-grade FDG uptake, with SUV max of 2.1.  Asymmetric FDG uptake is seen in the left hilum, with SUV max of 3.8. A 10 mm right paratracheal mediastinal lymph node shows FDG uptake, with SUV max of 3.5.  Previous left mastectomy. No evidence of hypermetabolic axillary lymphadenopathy.  ABDOMEN/PELVIS: No abnormal hypermetabolic activity within the liver, pancreas, adrenal glands, or spleen. No hypermetabolic lymph nodes in the abdomen or pelvis.  Bilateral low attenuation and hyperdense renal cysts are seen which show no evidence of hypermetabolic activity. Aortic atherosclerosis. Several small uterine fibroids also seen without hypermetabolic activity. Left colonic diverticulosis is noted, without evidence of diverticulitis. A tiny paraumbilical hernia seen containing only fat.  SKELETON: No focal hypermetabolic bone lesions to suggest skeletal metastasis.  IMPRESSION: Slowly enlarging 1.7 cm left upper lobe pulmonary nodule with low-grade metabolic activity, suspicious for low-grade adenocarcinoma.  Mild FDG uptake in left hilum and 1 cm right paratracheal lymph node. Lymph node metastases cannot be  excluded.  No evidence of metastatic disease within the neck, abdomen, or pelvis.  Multinodular goiter, with mild enlargement of 3.1 cm nodule in the left isthmus which shows hypermetabolic activity. Another focus of hypermetabolic activity is seen in the lateral right thyroid lobe. Thyroid carcinoma cannot be excluded. Recommend thyroid ultrasound for further evaluation.   Electronically Signed   By: Earle Gell M.D.   On: 02/28/2017 14:15 I personally reviewed the CT and PET/CT images and concur with the findings noted above  Pulmonary function testing 03/05/2017 FVC 1.53 (76%) FEV1 1.26 (82%) FEV1 1.34 (86%) postbronchodilator TLC 4.47 (94%) DLCO 13.07 (60%)  Impression: Mrs. Woodfield is a 76 year old woman with a 1.7 cm left upper lobe mass.  This has been present for many years dating back to 2010.  Her last previous CT was in 2012 in the nodule was present at that time at about 1.3 cm.  There is definitely been a mild size increase over the past 6 years.  This is only mildly hypermetabolic on PET CT but does bear further investigation.  She also has some mildly enlarged hilar and mediastinal nodes that are mildly hypermetabolic as well.  Therefore I think the best option for evaluation is with navigational bronchoscopy and endobronchial ultrasound to assess the primary nodule and the nodes.  Thyroid goiter-she has a large multinodular goiter that is causing some narrowing of her trachea.  There also is an area of significant hypermetabolic activity in the thyroid which could be a carcinoma.  She saw Dr. Chalmers Cater.  I do not have records of her visit.  Mrs. Thedford said that she was told to just have it taken out.  She did not want to have that done.  Apparently no referral was made.  She plans to seek a second opinion regarding the thyroid.  I reviewed the films with Ms. Vandoren and showed her the area of narrowing and the areas of concern.  I  have some concern about the ability  to intubate Mrs. Gudger given the narrowing of the trachea.  I am not sure that can be done with a tube that is large enough to allow the navigational bronchoscopy and endobronchial ultrasound.  Given the extremely slow growth rate of the left upper lobe nodule I would recommend at least having a plan for the thyroid before we attempt to proceed with a biopsy.  I discussed the general nature of navigational bronchoscopy and endobronchial ultrasound with her.  She understands this is typically done on an outpatient basis.  It is done in the operating room under general anesthesia.  We discussed the indications, risks, benefits, and alternatives.  She understands the risk include, but are not limited to death, MI, DVT, PE, bleeding, pneumothorax.  She understands that she would be at higher risk for airway/respiratory complications due to the narrowing of her airway.  She will follow-up with Dr. Chalmers Cater regarding referral to a thyroid surgeon.  She will let us know what that plan is once one is established.  We can then formulate a plan for timing of her navigational bronchoscopy and endobronchial ultrasound.  In the meantime we will see if we can get her CT from November converted to super D format for navigational bronchoscopy.  I suspect that will not be possible, and if not, she will need a new CT.  Problem list indicates angina at rest (noted in July 2013).  She denies any such symptoms, but does say she had some shortness of breath at rest over 7 years ago.  She had 2 hospital admissions it failed to reveal any cardiac problems.  She does have aortic and coronary atherosclerosis, but is not currently having any symptoms suggestive of angina.  [name], MD [facility] [phone]

## 2017-03-21 DIAGNOSIS — E782 Mixed hyperlipidemia: Secondary | ICD-10-CM | POA: Diagnosis not present

## 2017-03-21 DIAGNOSIS — E785 Hyperlipidemia, unspecified: Secondary | ICD-10-CM | POA: Diagnosis not present

## 2017-03-21 DIAGNOSIS — I10 Essential (primary) hypertension: Secondary | ICD-10-CM | POA: Diagnosis not present

## 2017-03-21 DIAGNOSIS — D519 Vitamin B12 deficiency anemia, unspecified: Secondary | ICD-10-CM | POA: Diagnosis not present

## 2017-03-21 DIAGNOSIS — Z853 Personal history of malignant neoplasm of breast: Secondary | ICD-10-CM | POA: Diagnosis not present

## 2017-03-21 DIAGNOSIS — E119 Type 2 diabetes mellitus without complications: Secondary | ICD-10-CM | POA: Diagnosis not present

## 2017-03-21 DIAGNOSIS — E1121 Type 2 diabetes mellitus with diabetic nephropathy: Secondary | ICD-10-CM | POA: Diagnosis not present

## 2017-03-21 DIAGNOSIS — M1990 Unspecified osteoarthritis, unspecified site: Secondary | ICD-10-CM | POA: Diagnosis not present

## 2017-03-21 DIAGNOSIS — D649 Anemia, unspecified: Secondary | ICD-10-CM | POA: Diagnosis not present

## 2017-03-27 DIAGNOSIS — H8112 Benign paroxysmal vertigo, left ear: Secondary | ICD-10-CM | POA: Diagnosis not present

## 2017-03-27 DIAGNOSIS — E042 Nontoxic multinodular goiter: Secondary | ICD-10-CM | POA: Diagnosis not present

## 2017-03-29 ENCOUNTER — Other Ambulatory Visit: Payer: Self-pay | Admitting: Thoracic Surgery (Cardiothoracic Vascular Surgery)

## 2017-03-29 DIAGNOSIS — E042 Nontoxic multinodular goiter: Secondary | ICD-10-CM

## 2017-03-29 DIAGNOSIS — R911 Solitary pulmonary nodule: Secondary | ICD-10-CM

## 2017-04-09 DIAGNOSIS — M25512 Pain in left shoulder: Secondary | ICD-10-CM | POA: Diagnosis not present

## 2017-04-09 DIAGNOSIS — R0781 Pleurodynia: Secondary | ICD-10-CM | POA: Diagnosis not present

## 2017-04-10 ENCOUNTER — Encounter: Payer: Self-pay | Admitting: Thoracic Surgery (Cardiothoracic Vascular Surgery)

## 2017-04-10 ENCOUNTER — Other Ambulatory Visit: Payer: Self-pay

## 2017-04-10 ENCOUNTER — Ambulatory Visit (INDEPENDENT_AMBULATORY_CARE_PROVIDER_SITE_OTHER): Payer: Medicare Other | Admitting: Thoracic Surgery (Cardiothoracic Vascular Surgery)

## 2017-04-10 ENCOUNTER — Ambulatory Visit
Admission: RE | Admit: 2017-04-10 | Discharge: 2017-04-10 | Disposition: A | Discharge status: Home / Self Care | Payer: Medicare Other | Source: Ambulatory Visit | Attending: Thoracic Surgery (Cardiothoracic Vascular Surgery) | Admitting: Thoracic Surgery (Cardiothoracic Vascular Surgery)

## 2017-04-10 ENCOUNTER — Other Ambulatory Visit: Payer: Self-pay | Admitting: *Deleted

## 2017-04-10 ENCOUNTER — Ambulatory Visit: Payer: Self-pay | Admitting: Surgery

## 2017-04-10 VITALS — BP 144/68 | HR 81 | Ht 62.0 in | Wt 161.4 lb

## 2017-04-10 DIAGNOSIS — E042 Nontoxic multinodular goiter: Secondary | ICD-10-CM | POA: Diagnosis not present

## 2017-04-10 DIAGNOSIS — R911 Solitary pulmonary nodule: Secondary | ICD-10-CM | POA: Diagnosis not present

## 2017-04-10 DIAGNOSIS — E0590 Thyrotoxicosis, unspecified without thyrotoxic crisis or storm: Secondary | ICD-10-CM | POA: Diagnosis not present

## 2017-04-10 DIAGNOSIS — R948 Abnormal results of function studies of other organs and systems: Secondary | ICD-10-CM | POA: Diagnosis not present

## 2017-04-10 DIAGNOSIS — D381 Neoplasm of uncertain behavior of trachea, bronchus and lung: Secondary | ICD-10-CM

## 2017-04-10 NOTE — H&P (View-Only) (Signed)
[address]       [place] [address]             [phone]       HPI: [name] returns to discuss her left upper lobe lung nodule  Karen West is a 77 year old woman with a past history significant for multinodular goiter, hypertension, hyperlipidemia, gastroesophageal reflux, left mastectomy for breast cancer, obstructive sleep apnea, type 2 diabetes, right bundle branch block, aortic atherosclerosis, and carotid disease.  She is a lifelong non-smoker.  She was first noted to have a lung nodule in 2010 which was an incidental finding on a CT done for a research study.  She had follow-up CTs for a couple of years but the nodule remains stable.  She was lost to follow-up.  She saw Dr. Chase Caller in November for evaluation of shortness of breath.  He ordered a chest CT which showed a left upper lobe lung nodule which is increased in size from her scan in 2012.  On PET CT the nodule was mildly hypermetabolic with an SUV of 2.1.  There was a 3.1 cm nodule in the left isthmus of her thyroid with an SUV max of 5.2.  I saw her on 03/19/2017.  She denied any problems with shortness of breath when I saw her.  Has not been having any chest pain, pressure, or tightness.  I recommended navigational bronchoscopy which she was to see Dr. Harlow Asa regarding her thyroid.  She saw him yesterday and he is recommending thyroidectomy.  Past Medical History:  Diagnosis Date  . Atherosclerosis of aorta ([place])   . Breast cancer ([place])    "cancer in both breasts; s/p left mastectomy  . Carotid artery disease ([place])    04/8784 w/ 76-72% LICA stenosis and <09% RICA stenosis  . Chronic anemia   . Chronic headaches   . GERD (gastroesophageal reflux disease)   . Hyperlipidemia   . Hypertension   . Multinodular goiter   . Obesity   . OSA on CPAP   . Osteoarthritis   . Personal history of radiation therapy 1999  . Right bundle branch block   . Type II or unspecified type diabetes  mellitus without mention of complication, uncontrolled     Current Outpatient Medications  Medication Sig Dispense Refill  . amLODipine (NORVASC) 10 MG tablet Take 10 mg by mouth daily.    Marland Kitchen aspirin 81 MG tablet Take 81 mg by mouth daily.      . Cholecalciferol (VITAMIN D PO) Vitamin D  600 mg daily    . ezetimibe (ZETIA) 10 MG tablet Take 10 mg daily by mouth. Take 1/2 tablet daily.    Marland Kitchen glipiZIDE (GLUCOTROL) 10 MG 24 hr tablet Take 10 mg by mouth 2 (two) times daily.      . hydrochlorothiazide (HYDRODIURIL) 12.5 MG tablet Take 1 tablet by mouth daily    . metFORMIN (GLUCOPHAGE) 1000 MG tablet 1,000 mg. Take by mouth two times a day    . Omega-3 Fatty Acids (FISH OIL) 1200 MG CAPS Take 1 capsule by mouth daily.    Marland Kitchen omeprazole (PRILOSEC) 20 MG capsule Take 20 mg by mouth daily.      . rosuvastatin (CRESTOR) 20 MG tablet Take 20 mg by mouth daily.     No current facility-administered medications for this visit.     Physical Exam BP (!) 144/68 (BP Location: Right Arm, Patient Position: Sitting, Cuff Size: Large)   Pulse 81  Ht 5\' 2"  (1.575 m)   Wt 161 lb 6.4 oz (73.2 kg)   SpO2 95% Comment: RA  BMI 29.40 kg/m  77 year old woman in no acute distress Alert and oriented x3 with no focal deficits Lungs clear with no rales or wheezing Neck supple with nodular thyromegaly Cardiac regular rate and rhythm with a 2/6 systolic murmur Abdomen soft and nontender Extremities without clubbing cyanosis or edema  Diagnostic Tests: CT CHEST WITHOUT CONTRAST  TECHNIQUE: Multidetector CT imaging of the chest was performed using thin slice collimation for electromagnetic bronchoscopy planning purposes, without intravenous contrast.  COMPARISON:  PET-CT 02/28/2017.  Chest CT 02/06/2017.  FINDINGS: Cardiovascular: The heart size is normal. No pericardial effusion. Coronary artery calcification is evident. Dense calcification of the mitral annulus noted. Atherosclerotic calcification is  noted in the wall of the thoracic aorta.  Mediastinum/Nodes: No mediastinal lymphadenopathy. No evidence for gross hilar lymphadenopathy although assessment is limited by the lack of intravenous contrast on today's study. The esophagus has normal imaging features. Bulky enlargement of the multinodular thyroid gland is similar to prior. Surgical clips are noted in the axillae bilaterally.  Lungs/Pleura: Left thyroid enlargement generates mass-effect on the trachea. Fine detail of lung parenchyma obscured by breathing motion during image acquisition. Right lower lobe calcified granuloma seen on image 56 of series 4. the posterior left upper lobe pulmonary nodule measures 15 x 13 mm today, similar to previous diagnostic CT 02/06/2017. The slightly increased measurement on the PET-CT was probably due to motion. No other suspicious pulmonary nodule or mass.  Upper Abdomen: Visualized portion of the upper abdomen is unremarkable.  Musculoskeletal: Bone windows reveal no worrisome lytic or sclerotic osseous lesions.  IMPRESSION: 1. Similar appearance of the posterior left upper lobe pulmonary nodule comparing to CT scan of 02/06/2017. This nodule did show low level hypermetabolism on the PET-CT of 02/28/2017. 2. Bulky multinodular thyroid with asymmetric enlargement of the left lobe generating mass-effect on the trachea, similar to prior. 3. Coronary artery and Aortic Atherosclerois (ICD10-170.0)   Electronically Signed   By: Misty Stanley M.D.   On: 04/10/2017 13:11 I personally reviewed the CT images and concur with the findings noted above.  Impression: Karen West is a 77 year old non-smoker with multiple medical problems who has a slowly enlarging nodule in the central portion of the left upper lobe.  This is not favorable for percutaneous biopsy due to its central location.  It will likely be difficult to get biopsies with navigational bronchoscopy, but it is worth an  attempt.  It would be nice to have a diagnosis on this so that we could determine the best way to treat.  She does have adequate pulmonary function to tolerate a lobectomy should that be her preference.  Another option would be stereotactic radiation.  She is going to have a thyroidectomy by Dr. Armandina Gemma in the near future.  I will speak with him to see if we can arrange to do both procedures at the same setting to avoid the need for repeat general anesthesia.  We have previously discussed the indications risks benefits and alternatives.  She does not have any questions regarding the risks of navigational bronchoscopy.  She does understand there is no guarantee of a definitive diagnosis.  Plan:  Navigational bronchoscopy.  Will schedule along with Dr. Gala Lewandowsky thyroidectomy  [name], MD [facility] [phone]

## 2017-04-10 NOTE — Progress Notes (Signed)
[address]       [place] [address]             [phone]       HPI: Karen West returns to discuss her left upper lobe lung nodule  Mrs. Bradner is a 77 year old woman with a past history significant for multinodular goiter, hypertension, hyperlipidemia, gastroesophageal reflux, left mastectomy for breast cancer, obstructive sleep apnea, type 2 diabetes, right bundle branch block, aortic atherosclerosis, and carotid disease.  She is a lifelong non-smoker.  She was first noted to have a lung nodule in 2010 which was an incidental finding on a CT done for a research study.  She had follow-up CTs for a couple of years but the nodule remains stable.  She was lost to follow-up.  She saw Dr. Chase Caller in November for evaluation of shortness of breath.  He ordered a chest CT which showed a left upper lobe lung nodule which is increased in size from her scan in 2012.  On PET CT the nodule was mildly hypermetabolic with an SUV of 2.1.  There was a 3.1 cm nodule in the left isthmus of her thyroid with an SUV max of 5.2.  I saw her on 03/19/2017.  She denied any problems with shortness of breath when I saw her.  Has not been having any chest pain, pressure, or tightness.  I recommended navigational bronchoscopy which she was to see Dr. Harlow Asa regarding her thyroid.  She saw him yesterday and he is recommending thyroidectomy.  Past Medical History:  Diagnosis Date  . Atherosclerosis of aorta ([place])   . Breast cancer ([place])    "cancer in both breasts; s/p left mastectomy  . Carotid artery disease ([place])    05/5007 w/ 38-18% LICA stenosis and <29% RICA stenosis  . Chronic anemia   . Chronic headaches   . GERD (gastroesophageal reflux disease)   . Hyperlipidemia   . Hypertension   . Multinodular goiter   . Obesity   . OSA on CPAP   . Osteoarthritis   . Personal history of radiation therapy 1999  . Right bundle branch block   . Type II or unspecified type diabetes  mellitus without mention of complication, uncontrolled     Current Outpatient Medications  Medication Sig Dispense Refill  . amLODipine (NORVASC) 10 MG tablet Take 10 mg by mouth daily.    Marland Kitchen aspirin 81 MG tablet Take 81 mg by mouth daily.      . Cholecalciferol (VITAMIN D PO) Vitamin D  600 mg daily    . ezetimibe (ZETIA) 10 MG tablet Take 10 mg daily by mouth. Take 1/2 tablet daily.    Marland Kitchen glipiZIDE (GLUCOTROL) 10 MG 24 hr tablet Take 10 mg by mouth 2 (two) times daily.      . hydrochlorothiazide (HYDRODIURIL) 12.5 MG tablet Take 1 tablet by mouth daily    . metFORMIN (GLUCOPHAGE) 1000 MG tablet 1,000 mg. Take by mouth two times a day    . Omega-3 Fatty Acids (FISH OIL) 1200 MG CAPS Take 1 capsule by mouth daily.    Marland Kitchen omeprazole (PRILOSEC) 20 MG capsule Take 20 mg by mouth daily.      . rosuvastatin (CRESTOR) 20 MG tablet Take 20 mg by mouth daily.     No current facility-administered medications for this visit.     Physical Exam BP (!) 144/68 (BP Location: Right Arm, Patient Position: Sitting, Cuff Size: Large)   Pulse 81  Ht 5\' 2"  (1.575 m)   Wt 161 lb 6.4 oz (73.2 kg)   SpO2 95% Comment: RA  BMI 29.40 kg/m  77 year old woman in no acute distress Alert and oriented x3 with no focal deficits Lungs clear with no rales or wheezing Neck supple with nodular thyromegaly Cardiac regular rate and rhythm with a 2/6 systolic murmur Abdomen soft and nontender Extremities without clubbing cyanosis or edema  Diagnostic Tests: CT CHEST WITHOUT CONTRAST  TECHNIQUE: Multidetector CT imaging of the chest was performed using thin slice collimation for electromagnetic bronchoscopy planning purposes, without intravenous contrast.  COMPARISON:  PET-CT 02/28/2017.  Chest CT 02/06/2017.  FINDINGS: Cardiovascular: The heart size is normal. No pericardial effusion. Coronary artery calcification is evident. Dense calcification of the mitral annulus noted. Atherosclerotic calcification is  noted in the wall of the thoracic aorta.  Mediastinum/Nodes: No mediastinal lymphadenopathy. No evidence for gross hilar lymphadenopathy although assessment is limited by the lack of intravenous contrast on today's study. The esophagus has normal imaging features. Bulky enlargement of the multinodular thyroid gland is similar to prior. Surgical clips are noted in the axillae bilaterally.  Lungs/Pleura: Left thyroid enlargement generates mass-effect on the trachea. Fine detail of lung parenchyma obscured by breathing motion during image acquisition. Right lower lobe calcified granuloma seen on image 56 of series 4. the posterior left upper lobe pulmonary nodule measures 15 x 13 mm today, similar to previous diagnostic CT 02/06/2017. The slightly increased measurement on the PET-CT was probably due to motion. No other suspicious pulmonary nodule or mass.  Upper Abdomen: Visualized portion of the upper abdomen is unremarkable.  Musculoskeletal: Bone windows reveal no worrisome lytic or sclerotic osseous lesions.  IMPRESSION: 1. Similar appearance of the posterior left upper lobe pulmonary nodule comparing to CT scan of 02/06/2017. This nodule did show low level hypermetabolism on the PET-CT of 02/28/2017. 2. Bulky multinodular thyroid with asymmetric enlargement of the left lobe generating mass-effect on the trachea, similar to prior. 3. Coronary artery and Aortic Atherosclerois (ICD10-170.0)   Electronically Signed   By: Misty Stanley M.D.   On: 04/10/2017 13:11 I personally reviewed the CT images and concur with the findings noted above.  Impression: Karen West is a 77 year old non-smoker with multiple medical problems who has a slowly enlarging nodule in the central portion of the left upper lobe.  This is not favorable for percutaneous biopsy due to its central location.  It will likely be difficult to get biopsies with navigational bronchoscopy, but it is worth an  attempt.  It would be nice to have a diagnosis on this so that we could determine the best way to treat.  She does have adequate pulmonary function to tolerate a lobectomy should that be her preference.  Another option would be stereotactic radiation.  She is going to have a thyroidectomy by Dr. Armandina Gemma in the near future.  I will speak with him to see if we can arrange to do both procedures at the same setting to avoid the need for repeat general anesthesia.  We have previously discussed the indications risks benefits and alternatives.  She does not have any questions regarding the risks of navigational bronchoscopy.  She does understand there is no guarantee of a definitive diagnosis.  Plan:  Navigational bronchoscopy.  Will schedule along with Dr. Gala Lewandowsky thyroidectomy  [name], MD [facility] [phone]

## 2017-04-25 DIAGNOSIS — D649 Anemia, unspecified: Secondary | ICD-10-CM | POA: Diagnosis not present

## 2017-04-25 DIAGNOSIS — E1121 Type 2 diabetes mellitus with diabetic nephropathy: Secondary | ICD-10-CM | POA: Diagnosis not present

## 2017-04-25 DIAGNOSIS — E785 Hyperlipidemia, unspecified: Secondary | ICD-10-CM | POA: Diagnosis not present

## 2017-04-25 DIAGNOSIS — D519 Vitamin B12 deficiency anemia, unspecified: Secondary | ICD-10-CM | POA: Diagnosis not present

## 2017-04-25 DIAGNOSIS — E119 Type 2 diabetes mellitus without complications: Secondary | ICD-10-CM | POA: Diagnosis not present

## 2017-04-25 DIAGNOSIS — Z853 Personal history of malignant neoplasm of breast: Secondary | ICD-10-CM | POA: Diagnosis not present

## 2017-04-25 DIAGNOSIS — E782 Mixed hyperlipidemia: Secondary | ICD-10-CM | POA: Diagnosis not present

## 2017-04-25 DIAGNOSIS — M1990 Unspecified osteoarthritis, unspecified site: Secondary | ICD-10-CM | POA: Diagnosis not present

## 2017-04-25 DIAGNOSIS — I10 Essential (primary) hypertension: Secondary | ICD-10-CM | POA: Diagnosis not present

## 2017-05-03 ENCOUNTER — Other Ambulatory Visit: Payer: Self-pay | Admitting: Obstetrics and Gynecology

## 2017-05-03 DIAGNOSIS — Z1231 Encounter for screening mammogram for malignant neoplasm of breast: Secondary | ICD-10-CM

## 2017-05-06 ENCOUNTER — Encounter (HOSPITAL_COMMUNITY): Payer: Self-pay | Admitting: Surgery

## 2017-05-06 DIAGNOSIS — R948 Abnormal results of function studies of other organs and systems: Secondary | ICD-10-CM

## 2017-05-06 NOTE — H&P (Signed)
General Surgery [facility]  BRISA AUTH DOB: [birth year] Widowed / Language: Cleophus Molt / Race: Black or African American Female   History of Present Illness  The patient is a 77 year old female who presents with a complaint of Enlarged thyroid.  CC: thyroid nodules, hyperthyroidism, tracheal shift  Patient is referred by Dr. Jacelyn Pi for evaluation of multinodular thyroid goiter, hyperthyroidism, and tracheal deviation. Patient has had a known thyroid goiter for many years. She was diagnosed by her primary care physician, Dr. Deland Pretty. She initially took low-dose thyroid hormone suppression. Patient has developed mild hyperthyroidism and levothyroxine has been discontinued. Patient has been followed with ultrasound examination and reports having had 1 percutaneous biopsy performed which was benign. She has recently been noted to have a pulmonary nodule. Part of her evaluation has included a PET scan performed on February 28, 2017. This showed a hypermetabolic 3.1 cm nodule in the left side of the isthmus which has increased in size. Risk of thyroid malignancy exists. CT scan of the chest also showed thyroid nodules with tracheal deviation towards the right. Most recent TSH level is suppressed at 0.16. Patient notes mild dyspnea on occasion. She denies any significant dysphagia. She has had no prior head or neck surgery. There is no family history of thyroid disease. Patient presents today to discuss possible thyroidectomy.   Past Surgical History Breast Biopsy  Bilateral. Breast Mass; Local Excision  Bilateral. Mastectomy  Left.  Diagnostic Studies History Colonoscopy  within last year Mammogram  within last year Pap Smear  1-5 years ago  Allergies TraMADol HCl *ANALGESICS - OPIOID*  Lisinopril *ANTIHYPERTENSIVES*  Allergies Reconciled   Medication History AmLODIPine Besylate (10MG  Tablet, Oral) Active. GlipiZIDE ER (10MG   Tablet ER 24HR, Oral) Active. HydroCHLOROthiazide (12.5MG  Tablet, Oral) Active. MetFORMIN HCl (1000MG  Tablet, Oral) Active. Omeprazole (20MG  Capsule DR, Oral) Active. Rosuvastatin Calcium (20MG  Tablet, Oral) Active. Aspirin (81MG  Tablet, Oral) Active. Crestor (20MG  Tablet, Oral) Active. Zetia (10MG  Tablet, Oral) Active. Medications Reconciled  Social History  Alcohol use  Occasional alcohol use. Caffeine use  Coffee, Tea. No drug use  Tobacco use  Never smoker.  Family History  Diabetes Mellitus  Brother, Daughter, Mother, Sister, Son. Heart Disease  Mother.  Pregnancy / Birth History Age at menarche  34 years. Age of menopause  37-50 Gravida  4 Length (months) of breastfeeding  3-6 Maternal age  37-20 Para  4  Other Problems Breast Cancer  Diabetes Mellitus  Gastroesophageal Reflux Disease  High blood pressure  Hypercholesterolemia  Lump In Breast  Sleep Apnea  Thyroid Disease   Review of Systems  General Present- Fatigue. Not Present- Appetite Loss, Chills, Fever, Night Sweats, Weight Gain and Weight Loss. Skin Not Present- Change in Wart/Mole, Dryness, Hives, Jaundice, New Lesions, Non-Healing Wounds, Rash and Ulcer. HEENT Present- Wears glasses/contact lenses. Not Present- Earache, Hearing Loss, Hoarseness, Nose Bleed, Oral Ulcers, Ringing in the Ears, Seasonal Allergies, Sinus Pain, Sore Throat, Visual Disturbances and Yellow Eyes. Respiratory Present- Difficulty Breathing. Not Present- Bloody sputum, Chronic Cough, Snoring and Wheezing. Breast Not Present- Breast Mass, Breast Pain, Nipple Discharge and Skin Changes. Cardiovascular Present- Chest Pain and Shortness of Breath. Not Present- Difficulty Breathing Lying Down, Leg Cramps, Palpitations, Rapid Heart Rate and Swelling of Extremities. Gastrointestinal Present- Abdominal Pain and Gets full quickly at meals. Not Present- Bloating, Bloody Stool, Change in Bowel Habits, Chronic  diarrhea, Constipation, Difficulty Swallowing, Excessive gas, Hemorrhoids, Indigestion, Nausea, Rectal Pain and Vomiting. Female Genitourinary Present- Nocturia. Not  Present- Frequency, Painful Urination, Pelvic Pain and Urgency. Musculoskeletal Present- Joint Pain. Not Present- Back Pain, Joint Stiffness, Muscle Pain, Muscle Weakness and Swelling of Extremities. Neurological Not Present- Decreased Memory, Fainting, Headaches, Numbness, Seizures, Tingling, Tremor, Trouble walking and Weakness. Psychiatric Not Present- Anxiety, Bipolar, Change in Sleep Pattern, Depression, Fearful and Frequent crying. Endocrine Not Present- Cold Intolerance, Excessive Hunger, Hair Changes, Heat Intolerance, Hot flashes and New Diabetes. Hematology Not Present- Blood Thinners, Easy Bruising, Excessive bleeding, Gland problems, HIV and Persistent Infections.  Vitals Weight: 161 lb Height: 62in Body Surface Area: 1.74 m Body Mass Index: 29.45 kg/m  Temp.: 98.78F  Pulse: 92 (Regular)  BP: 132/74 (Sitting, Left Arm, Standard)  Physical Exam   See vital signs recorded above  GENERAL APPEARANCE Development: normal Nutritional status: normal Gross deformities: none  SKIN Rash, lesions, ulcers: none Induration, erythema: none Nodules: none palpable  EYES Conjunctiva and lids: normal Pupils: equal and reactive Iris: normal bilaterally  EARS, NOSE, MOUTH, THROAT External ears: no lesion or deformity External nose: no lesion or deformity Hearing: grossly normal Lips: no lesion or deformity Dentition: normal for age Oral mucosa: moist  NECK Symmetric: no Trachea: rightward deviation, approx 1-2 cm Thyroid: Palpable bilateral thyroid nodules. Dominant nodule left anterior isthmus, approximately 2-3 cm in size, relatively firm, somewhat fixed to underlying tissue, nontender.  CHEST Respiratory effort: normal Retraction or accessory muscle use: no Breath sounds: normal  bilaterally Rales, rhonchi, wheeze: none  CARDIOVASCULAR Auscultation: regular rhythm, normal rate Murmurs: none Pulses: carotid and radial pulse 2+ palpable Lower extremity edema: none Lower extremity varicosities: none  MUSCULOSKELETAL Station and gait: normal Digits and nails: no clubbing or cyanosis Muscle strength: grossly normal all extremities Range of motion: grossly normal all extremities Deformity: none  LYMPHATIC Cervical: none palpable Supraclavicular: none palpable  PSYCHIATRIC Oriented to person, place, and time: yes Mood and affect: normal for situation Judgment and insight: appropriate for situation    Assessment & Plan  MULTIPLE THYROID NODULES (E04.2) ABNORMAL POSITRON EMISSION TOMOGRAPHY (PET) SCAN (R94.8) HYPERTHYROIDISM (E05.90)  Pt Education - Pamphlet Given - The Thyroid Book: discussed with patient and provided information.  Patient presents on referral from her endocrinologist. She has a long-standing multinodular goiter. This has gradually increased in size. She has some tracheal deviation from the left towards the right. On PET scanning, there is hypermetabolic activity in the thyroid gland given a risk of malignancy of between 30 and 50%. Patient has mild compressive symptoms. She has mild hyperthyroidism. Based on all of these findings, I have recommended total thyroidectomy as the procedure of choice.  Patient is provided with written literature on thyroid surgery to review at home.  Patient and I discussed total thyroidectomy. We discussed the risk and benefits including the risk of recurrent laryngeal nerve injury and injury to parathyroid glands. We discussed the location of the surgical incision, the hospital stay, and the postoperative recovery. We discussed the need for lifelong thyroid hormone replacement. We discussed the potential need for radioactive iodine treatment.  Patient is seeing Dr. Merilynn Finland in consultation  this afternoon regarding a pulmonary nodule. She may need to have a biopsy performed. She is interested in having this performed concurrent with her thyroid surgery. We will discuss this with Dr. Roxan Hockey and if possible coordinate these 2 procedures.  The risks and benefits of the procedure have been discussed at length with the patient. The patient understands the proposed procedure, potential alternative treatments, and the course of recovery to be expected. All of  the patient's questions have been answered at this time. The patient wishes to proceed with surgery.  Armandina Gemma, [place] [facility] Office: [phone]

## 2017-05-07 ENCOUNTER — Ambulatory Visit: Payer: Medicare Other | Admitting: Internal Medicine

## 2017-05-08 ENCOUNTER — Encounter (HOSPITAL_COMMUNITY): Payer: Self-pay

## 2017-05-08 ENCOUNTER — Other Ambulatory Visit: Payer: Self-pay

## 2017-05-08 ENCOUNTER — Ambulatory Visit (HOSPITAL_COMMUNITY)
Admission: RE | Admit: 2017-05-08 | Discharge: 2017-05-08 | Disposition: A | Discharge status: Home / Self Care | Payer: Medicare Other | Source: Ambulatory Visit | Attending: Thoracic Surgery (Cardiothoracic Vascular Surgery) | Admitting: Thoracic Surgery (Cardiothoracic Vascular Surgery)

## 2017-05-08 ENCOUNTER — Encounter (HOSPITAL_COMMUNITY)
Admission: RE | Admit: 2017-05-08 | Discharge: 2017-05-08 | Disposition: A | Discharge status: Home / Self Care | Payer: Medicare Other | Source: Ambulatory Visit | Attending: Thoracic Surgery (Cardiothoracic Vascular Surgery) | Admitting: Thoracic Surgery (Cardiothoracic Vascular Surgery)

## 2017-05-08 DIAGNOSIS — E042 Nontoxic multinodular goiter: Secondary | ICD-10-CM | POA: Insufficient documentation

## 2017-05-08 DIAGNOSIS — Z01812 Encounter for preprocedural laboratory examination: Secondary | ICD-10-CM | POA: Diagnosis not present

## 2017-05-08 DIAGNOSIS — E049 Nontoxic goiter, unspecified: Secondary | ICD-10-CM | POA: Diagnosis not present

## 2017-05-08 DIAGNOSIS — I700 Atherosclerosis of aorta: Secondary | ICD-10-CM | POA: Diagnosis not present

## 2017-05-08 DIAGNOSIS — Z01810 Encounter for preprocedural cardiovascular examination: Secondary | ICD-10-CM | POA: Insufficient documentation

## 2017-05-08 DIAGNOSIS — R911 Solitary pulmonary nodule: Secondary | ICD-10-CM | POA: Insufficient documentation

## 2017-05-08 DIAGNOSIS — J398 Other specified diseases of upper respiratory tract: Secondary | ICD-10-CM | POA: Diagnosis not present

## 2017-05-08 DIAGNOSIS — E0590 Thyrotoxicosis, unspecified without thyrotoxic crisis or storm: Secondary | ICD-10-CM | POA: Diagnosis not present

## 2017-05-08 HISTORY — DX: Thyrotoxicosis, unspecified without thyrotoxic crisis or storm: E05.90

## 2017-05-08 LAB — PROTIME-INR
INR: 0.95
Prothrombin Time: 12.5 seconds (ref 11.4–15.2)

## 2017-05-08 LAB — CBC
HCT: 34.8 % — ABNORMAL LOW (ref 36.0–46.0)
Hemoglobin: 11.3 g/dL — ABNORMAL LOW (ref 12.0–15.0)
MCH: 28.3 pg (ref 26.0–34.0)
MCHC: 32.5 g/dL (ref 30.0–36.0)
MCV: 87.2 fL (ref 78.0–100.0)
Platelets: 301 10*3/uL (ref 150–400)
RBC: 3.99 MIL/uL (ref 3.87–5.11)
RDW: 14.3 % (ref 11.5–15.5)
WBC: 7.1 10*3/uL (ref 4.0–10.5)

## 2017-05-08 LAB — COMPREHENSIVE METABOLIC PANEL
ALT: 17 U/L (ref 14–54)
AST: 24 U/L (ref 15–41)
Albumin: 3.9 g/dL (ref 3.5–5.0)
Alkaline Phosphatase: 56 U/L (ref 38–126)
Anion gap: 13 (ref 5–15)
BUN: 15 mg/dL (ref 6–20)
CO2: 26 mmol/L (ref 22–32)
Calcium: 10.3 mg/dL (ref 8.9–10.3)
Chloride: 98 mmol/L — ABNORMAL LOW (ref 101–111)
Creatinine, Ser: 1.02 mg/dL — ABNORMAL HIGH (ref 0.44–1.00)
GFR calc Af Amer: >60 mL/min (ref >60)
GFR calc non Af Amer: 53 mL/min — ABNORMAL LOW (ref >60)
Glucose, Bld: 119 mg/dL — ABNORMAL HIGH (ref 65–99)
Potassium: 3.2 mmol/L — ABNORMAL LOW (ref 3.5–5.1)
Sodium: 137 mmol/L (ref 135–145)
Total Bilirubin: 0.6 mg/dL (ref 0.3–1.2)
Total Protein: 7.4 g/dL (ref 6.5–8.1)

## 2017-05-08 LAB — APTT: aPTT: 25 seconds (ref 24–36)

## 2017-05-08 LAB — HEMOGLOBIN A1C
Hgb A1c MFr Bld: 6.9 % — ABNORMAL HIGH (ref 4.8–5.6)
Mean Plasma Glucose: 151.33 mg/dL

## 2017-05-08 LAB — GLUCOSE, CAPILLARY: Glucose-Capillary: 114 mg/dL — ABNORMAL HIGH (ref 65–99)

## 2017-05-08 MED ORDER — CHLORHEXIDINE GLUCONATE CLOTH 2 % EX PADS: 6.0000 | MEDICATED_PAD | Freq: Once | CUTANEOUS | Status: DC

## 2017-05-08 NOTE — Pre-Procedure Instructions (Signed)
    SERENE KOPF  05/08/2017      [id number] [address], [place] [address] Phone: [phone] Fax: [phone]    Your procedure is scheduled on 05/10/17.  Report to [facility] Admitting at 6 A.M.  Call this number if you have problems the morning of surgery:  [phone]   Remember:  Do not eat food or drink liquids after midnight.  Take these medicines the morning of surgery with A SIP OF WATER ---tylenol,norvas,prilosec   Do not wear jewelry, make-up or nail polish.  Do not wear lotions, powders, or perfumes, or deodorant.  Do not shave 48 hours prior to surgery.  Men may shave face and neck.  Do not bring valuables to the hospital.  [facility] is not responsible for any belongings or valuables.  Contacts, dentures or bridgework may not be worn into surgery.  Leave your suitcase in the car.  After surgery it may be brought to your room.  For patients admitted to the hospital, discharge time will be determined by your treatment team.  Patients discharged the day of surgery will not be allowed to drive home.   Name and phone number of your driver:    Special instructions:  Do not take any aspirin,anti-inflammatories,vitamins,or herbal supplements 5-7 days prior to surgery.  Please read over the following fact sheets that you were given.

## 2017-05-09 ENCOUNTER — Encounter (HOSPITAL_COMMUNITY): Payer: Self-pay

## 2017-05-09 NOTE — Progress Notes (Addendum)
Anesthesia Chart Review: Patient is a 77 year old female scheduled for video bronchoscopy with endobronchial navigation (Dr. Modesto Charon) and total thyroidectomy (Dr. Armandina Gemma) on 05/10/17.  History includes never smoker, HTN, HLD, chronic anemia, GERD, osteoarthritis, RBBB, aortic atherosclerosis, multinodular goiter, hyperthyroidism ("mild"; developed angioedema and acute renal failure on methimazole), DM2, carotid artery stenosis, chronic headaches, bilateral breast cancer (s/p right lumpectomy '90's, left mastectomy '00, radiation), LUL lung nodule (increased in size since 2012), OSA (CPAP), dyspnea. 2017 echo showed a possible PFO.     - PCP is Dr. Deland Pretty. - Endocrinologist is Dr. Jacelyn Pi. She referred patient to Dr. Harlow Asa.   - Pulmonologist is Dr. Brand Males.  - Cardiologist is Dr. Adrian Prows with [facility]. Last visit 02/04/16 with Neldon Labella, NP for tachycardia follow-up. Symptoms improved with b-blocker. Stress and echo discussed (see below) and PRN cardiology follow-up recommended by Dr. Einar Gip.  - Nephrologist is Dr. Edrick Oh. 07/10/16 office note is scanned under the Media tab. She had been seen for acute renal failure in the setting of methimazole. She she had also had associated angioedema. ARF had resolved, so PRN follow-up recommended. She is no longer on methimazole.  Meds include amlodipine, ASA 81 mg (patient to stop ASA 5 days prior to surgery), Zetia, glipizide, HCTZ, fish oil, Prilosec, Crestor.   BP (!) 151/73   Pulse 81   Temp 36.7 C   Resp 18   Ht 5\' 2"  (1.575 m)   Wt 159 lb 8 oz (72.3 kg)   SpO2 100%   BMI 29.17 kg/m   EKG 05/08/17: NSR, incomplete RBBB.   Echocardiogram 01/27/16 [facility]):  1. Left ventricle cavity is normal in size.  Moderate concentric hypertrophy of the left ventricle.  Normal global wall motion.  Doppler evidence of grade 1 (impaired) diastolic dysfunction, normal LAP. Calculated EF 55%.  2.  Left atrial cavity is moderately dilated at 4.6 cm.  An incidental small patent foramen ovale is probably present.   3. Mild aortic valve leaflet calcification.  Mildly restricted aortic valve leaflets.  No evidence of aortic valve stenosis.  Mild (grade 1) aortic regurgitation.   4. Moderate calcification of the mitral valve annulus.  Trace mitral regurgitation.   5. Mild tricuspid regurgitation.  No evidence of pulmonary hypertension.  Nuclear stress test 01/17/16 [facility]):  1. The resting electrocardiogram demonstrated NSR, right BBB, and no resting arrhythmias.  Stress ECG was normal.  Patient exercised on Bruce protocol for 4 minutes and achieved 4.64 METS.  Stress test terminated due to 100% maximum predicted heart rate achieved (Target HR > 85%).  Symptoms include dyspnea dizziness. 2.  Myocardial perfusion imaging is normal.  Overall left ventricular systolic function was normal without regional wall motion abnormalities.  The left ventricular ejection fraction was 63%.  30 day Holter monitor 11/05/15 (as outlined in [place] [facility] records): One episode of atrial tachycardia, correlated with symptom of dizziness.  Carotid U/S 10/21/15: IMPRESSION: - Left greater than right carotid atherosclerosis. - No significant ICA stenosis, degree of narrowing less than 50% bilaterally. - Patent antegrade vertebral flow bilaterally  Preoperative labs noted. Cr 1.02, K 3.2. H/H 11.3/34.8, PLT 301. PT/PTT WNL. Glucose 119. A1c 6.9. According to Dr. Gala Lewandowsky H&P, her most recent TSH level was suppressed at 0.16. (No longer on methimazole. Mildly hypertensive, normal HR at PAT.)   If no acute changes then I would anticipate that she can proceed as planned.  Myra Gianotti, PA-C [facility]/Anesthesiology  Phone [phone] 05/09/2017 11:30 AM

## 2017-05-10 ENCOUNTER — Other Ambulatory Visit: Payer: Self-pay

## 2017-05-10 ENCOUNTER — Ambulatory Visit (HOSPITAL_COMMUNITY): Payer: Medicare Other

## 2017-05-10 ENCOUNTER — Observation Stay (HOSPITAL_COMMUNITY)
Admission: RE | Admit: 2017-05-10 | Discharge: 2017-05-11 | Disposition: A | Discharge status: Home / Self Care | Payer: Medicare Other | Source: Ambulatory Visit | Attending: Surgery | Admitting: Surgery

## 2017-05-10 ENCOUNTER — Ambulatory Visit (HOSPITAL_COMMUNITY): Payer: Medicare Other | Admitting: Vascular Surgery

## 2017-05-10 ENCOUNTER — Encounter (HOSPITAL_COMMUNITY)
Admission: RE | Disposition: A | Discharge status: Home / Self Care | Payer: Self-pay | Source: Ambulatory Visit | Attending: Surgery

## 2017-05-10 ENCOUNTER — Encounter (HOSPITAL_COMMUNITY): Payer: Self-pay

## 2017-05-10 DIAGNOSIS — Z419 Encounter for procedure for purposes other than remedying health state, unspecified: Secondary | ICD-10-CM

## 2017-05-10 DIAGNOSIS — E7800 Pure hypercholesterolemia, unspecified: Secondary | ICD-10-CM | POA: Diagnosis not present

## 2017-05-10 DIAGNOSIS — Z853 Personal history of malignant neoplasm of breast: Secondary | ICD-10-CM | POA: Insufficient documentation

## 2017-05-10 DIAGNOSIS — E1151 Type 2 diabetes mellitus with diabetic peripheral angiopathy without gangrene: Secondary | ICD-10-CM | POA: Insufficient documentation

## 2017-05-10 DIAGNOSIS — I10 Essential (primary) hypertension: Secondary | ICD-10-CM | POA: Insufficient documentation

## 2017-05-10 DIAGNOSIS — R9389 Abnormal findings on diagnostic imaging of other specified body structures: Secondary | ICD-10-CM | POA: Insufficient documentation

## 2017-05-10 DIAGNOSIS — Z7984 Long term (current) use of oral hypoglycemic drugs: Secondary | ICD-10-CM | POA: Diagnosis not present

## 2017-05-10 DIAGNOSIS — E785 Hyperlipidemia, unspecified: Secondary | ICD-10-CM | POA: Diagnosis not present

## 2017-05-10 DIAGNOSIS — E0590 Thyrotoxicosis, unspecified without thyrotoxic crisis or storm: Secondary | ICD-10-CM | POA: Diagnosis present

## 2017-05-10 DIAGNOSIS — C3412 Malignant neoplasm of upper lobe, left bronchus or lung: Secondary | ICD-10-CM | POA: Diagnosis not present

## 2017-05-10 DIAGNOSIS — Z79899 Other long term (current) drug therapy: Secondary | ICD-10-CM | POA: Insufficient documentation

## 2017-05-10 DIAGNOSIS — R948 Abnormal results of function studies of other organs and systems: Secondary | ICD-10-CM

## 2017-05-10 DIAGNOSIS — Z7982 Long term (current) use of aspirin: Secondary | ICD-10-CM | POA: Insufficient documentation

## 2017-05-10 DIAGNOSIS — E0520 Thyrotoxicosis with toxic multinodular goiter without thyrotoxic crisis or storm: Principal | ICD-10-CM | POA: Insufficient documentation

## 2017-05-10 DIAGNOSIS — E042 Nontoxic multinodular goiter: Secondary | ICD-10-CM

## 2017-05-10 DIAGNOSIS — R846 Abnormal cytological findings in specimens from respiratory organs and thorax: Secondary | ICD-10-CM | POA: Diagnosis not present

## 2017-05-10 DIAGNOSIS — G4730 Sleep apnea, unspecified: Secondary | ICD-10-CM | POA: Insufficient documentation

## 2017-05-10 DIAGNOSIS — K219 Gastro-esophageal reflux disease without esophagitis: Secondary | ICD-10-CM | POA: Diagnosis not present

## 2017-05-10 DIAGNOSIS — R911 Solitary pulmonary nodule: Secondary | ICD-10-CM | POA: Insufficient documentation

## 2017-05-10 HISTORY — PX: THYROIDECTOMY: SHX17

## 2017-05-10 HISTORY — PX: VIDEO BRONCHOSCOPY WITH ENDOBRONCHIAL NAVIGATION: SHX6175

## 2017-05-10 LAB — GLUCOSE, CAPILLARY
Glucose-Capillary: 119 mg/dL — ABNORMAL HIGH (ref 65–99)
Glucose-Capillary: 181 mg/dL — ABNORMAL HIGH (ref 65–99)
Glucose-Capillary: 243 mg/dL — ABNORMAL HIGH (ref 65–99)
Glucose-Capillary: 289 mg/dL — ABNORMAL HIGH (ref 65–99)

## 2017-05-10 SURGERY — VIDEO BRONCHOSCOPY WITH ENDOBRONCHIAL NAVIGATION
Anesthesia: General

## 2017-05-10 MED ORDER — SUCCINYLCHOLINE CHLORIDE 200 MG/10ML IV SOSY
PREFILLED_SYRINGE | INTRAVENOUS | Status: DC | PRN
  Administered 2017-05-10: 120 mg via INTRAVENOUS

## 2017-05-10 MED ORDER — HYDROMORPHONE HCL 1 MG/ML IJ SOLN: 1.0000 mg | INTRAMUSCULAR | Status: DC | PRN

## 2017-05-10 MED ORDER — SUGAMMADEX SODIUM 200 MG/2ML IV SOLN
INTRAVENOUS | Status: DC | PRN
  Administered 2017-05-10: 200 mg via INTRAVENOUS

## 2017-05-10 MED ORDER — PHENYLEPHRINE 40 MCG/ML (10ML) SYRINGE FOR IV PUSH (FOR BLOOD PRESSURE SUPPORT)
PREFILLED_SYRINGE | INTRAVENOUS | Status: DC | PRN
  Administered 2017-05-10 (×2): 80 ug via INTRAVENOUS

## 2017-05-10 MED ORDER — PHENYLEPHRINE 40 MCG/ML (10ML) SYRINGE FOR IV PUSH (FOR BLOOD PRESSURE SUPPORT)
PREFILLED_SYRINGE | INTRAVENOUS | Status: AC
  Filled 2017-05-10: qty 10

## 2017-05-10 MED ORDER — SUGAMMADEX SODIUM 200 MG/2ML IV SOLN
INTRAVENOUS | Status: AC
  Filled 2017-05-10: qty 2

## 2017-05-10 MED ORDER — ONDANSETRON HCL 4 MG/2ML IJ SOLN
INTRAMUSCULAR | Status: AC
  Filled 2017-05-10: qty 2

## 2017-05-10 MED ORDER — ROCURONIUM BROMIDE 10 MG/ML (PF) SYRINGE
PREFILLED_SYRINGE | INTRAVENOUS | Status: AC
  Filled 2017-05-10: qty 5

## 2017-05-10 MED ORDER — PROPOFOL 10 MG/ML IV BOLUS
INTRAVENOUS | Status: AC
  Filled 2017-05-10: qty 20

## 2017-05-10 MED ORDER — HYDROCODONE-ACETAMINOPHEN 5-325 MG PO TABS
1.0000 | ORAL_TABLET | ORAL | Status: DC | PRN
  Filled 2017-05-10: qty 1

## 2017-05-10 MED ORDER — FENTANYL CITRATE (PF) 100 MCG/2ML IJ SOLN
INTRAMUSCULAR | Status: AC
  Administered 2017-05-10: 50 ug via INTRAVENOUS
  Filled 2017-05-10: qty 2

## 2017-05-10 MED ORDER — CEFAZOLIN SODIUM-DEXTROSE 2-4 GM/100ML-% IV SOLN
INTRAVENOUS | Status: AC
  Filled 2017-05-10: qty 100

## 2017-05-10 MED ORDER — PHENYLEPHRINE HCL 10 MG/ML IJ SOLN
INTRAVENOUS | Status: DC | PRN
  Administered 2017-05-10: 30 ug/min via INTRAVENOUS

## 2017-05-10 MED ORDER — EPHEDRINE 5 MG/ML INJ
INTRAVENOUS | Status: AC
  Filled 2017-05-10: qty 10

## 2017-05-10 MED ORDER — ACETAMINOPHEN 325 MG PO TABS
650.0000 mg | ORAL_TABLET | Freq: Four times a day (QID) | ORAL | Status: DC | PRN
  Administered 2017-05-10 – 2017-05-11 (×3): 650 mg via ORAL
  Filled 2017-05-10 (×3): qty 2

## 2017-05-10 MED ORDER — CEFAZOLIN SODIUM-DEXTROSE 2-4 GM/100ML-% IV SOLN
2.0000 g | INTRAVENOUS | Status: AC
  Administered 2017-05-10: 2 g via INTRAVENOUS

## 2017-05-10 MED ORDER — LABETALOL HCL 5 MG/ML IV SOLN
INTRAVENOUS | Status: AC
  Filled 2017-05-10: qty 4

## 2017-05-10 MED ORDER — EPINEPHRINE PF 1 MG/ML IJ SOLN
INTRAMUSCULAR | Status: AC
  Filled 2017-05-10: qty 1

## 2017-05-10 MED ORDER — LIDOCAINE 2% (20 MG/ML) 5 ML SYRINGE
INTRAMUSCULAR | Status: DC | PRN
  Administered 2017-05-10: 100 mg via INTRAVENOUS

## 2017-05-10 MED ORDER — 0.9 % SODIUM CHLORIDE (POUR BTL) OPTIME
TOPICAL | Status: DC | PRN
  Administered 2017-05-10 (×2): 1000 mL

## 2017-05-10 MED ORDER — AMLODIPINE BESYLATE 10 MG PO TABS
10.0000 mg | ORAL_TABLET | Freq: Every day | ORAL | Status: DC
  Administered 2017-05-11: 10 mg via ORAL
  Filled 2017-05-10 (×2): qty 1

## 2017-05-10 MED ORDER — SUCCINYLCHOLINE CHLORIDE 200 MG/10ML IV SOSY
PREFILLED_SYRINGE | INTRAVENOUS | Status: AC
  Filled 2017-05-10: qty 10

## 2017-05-10 MED ORDER — PROPOFOL 10 MG/ML IV BOLUS
INTRAVENOUS | Status: DC | PRN
  Administered 2017-05-10: 15 mg via INTRAVENOUS
  Administered 2017-05-10: 20 mg via INTRAVENOUS
  Administered 2017-05-10: 15 mg via INTRAVENOUS
  Administered 2017-05-10: 150 mg via INTRAVENOUS

## 2017-05-10 MED ORDER — LACTATED RINGERS IV SOLN
INTRAVENOUS | Status: DC
  Administered 2017-05-10 (×2): via INTRAVENOUS

## 2017-05-10 MED ORDER — FENTANYL CITRATE (PF) 100 MCG/2ML IJ SOLN
25.0000 ug | INTRAMUSCULAR | Status: DC | PRN
  Administered 2017-05-10: 50 ug via INTRAVENOUS
  Administered 2017-05-10 (×2): 25 ug via INTRAVENOUS

## 2017-05-10 MED ORDER — FENTANYL CITRATE (PF) 250 MCG/5ML IJ SOLN
INTRAMUSCULAR | Status: DC | PRN
  Administered 2017-05-10: 25 ug via INTRAVENOUS
  Administered 2017-05-10: 50 ug via INTRAVENOUS
  Administered 2017-05-10: 25 ug via INTRAVENOUS
  Administered 2017-05-10: 100 ug via INTRAVENOUS
  Administered 2017-05-10: 50 ug via INTRAVENOUS

## 2017-05-10 MED ORDER — ONDANSETRON HCL 4 MG/2ML IJ SOLN: 4.0000 mg | Freq: Four times a day (QID) | INTRAMUSCULAR | Status: DC | PRN

## 2017-05-10 MED ORDER — HYDROCHLOROTHIAZIDE 25 MG PO TABS
12.5000 mg | ORAL_TABLET | Freq: Every day | ORAL | Status: DC
  Administered 2017-05-10 – 2017-05-11 (×2): 12.5 mg via ORAL
  Filled 2017-05-10 (×2): qty 1

## 2017-05-10 MED ORDER — DEXAMETHASONE SODIUM PHOSPHATE 10 MG/ML IJ SOLN
INTRAMUSCULAR | Status: AC
  Filled 2017-05-10: qty 1

## 2017-05-10 MED ORDER — GLIPIZIDE ER 10 MG PO TB24
10.0000 mg | ORAL_TABLET | Freq: Two times a day (BID) | ORAL | Status: DC
  Administered 2017-05-10 – 2017-05-11 (×2): 10 mg via ORAL
  Filled 2017-05-10 (×3): qty 1

## 2017-05-10 MED ORDER — ONDANSETRON 4 MG PO TBDP: 4.0000 mg | ORAL_TABLET | Freq: Four times a day (QID) | ORAL | Status: DC | PRN

## 2017-05-10 MED ORDER — LABETALOL HCL 5 MG/ML IV SOLN
INTRAVENOUS | Status: AC
  Filled 2017-05-10: qty 8

## 2017-05-10 MED ORDER — ARTIFICIAL TEARS OPHTHALMIC OINT
TOPICAL_OINTMENT | OPHTHALMIC | Status: DC | PRN
  Administered 2017-05-10: 1 via OPHTHALMIC

## 2017-05-10 MED ORDER — MIDAZOLAM HCL 2 MG/2ML IJ SOLN
INTRAMUSCULAR | Status: AC
  Filled 2017-05-10: qty 2

## 2017-05-10 MED ORDER — FENTANYL CITRATE (PF) 250 MCG/5ML IJ SOLN
INTRAMUSCULAR | Status: AC
  Filled 2017-05-10: qty 5

## 2017-05-10 MED ORDER — KCL IN DEXTROSE-NACL 20-5-0.45 MEQ/L-%-% IV SOLN
INTRAVENOUS | Status: DC
  Administered 2017-05-10: 14:00:00 via INTRAVENOUS
  Filled 2017-05-10: qty 1000

## 2017-05-10 MED ORDER — INSULIN ASPART 100 UNIT/ML ~~LOC~~ SOLN
0.0000 [IU] | Freq: Three times a day (TID) | SUBCUTANEOUS | Status: DC
  Administered 2017-05-10 – 2017-05-11 (×2): 5 [IU] via SUBCUTANEOUS
  Administered 2017-05-11: 3 [IU] via SUBCUTANEOUS
  Filled 2017-05-10 (×26): qty 0.15

## 2017-05-10 MED ORDER — ARTIFICIAL TEARS OPHTHALMIC OINT
TOPICAL_OINTMENT | OPHTHALMIC | Status: AC
  Filled 2017-05-10: qty 3.5

## 2017-05-10 MED ORDER — LABETALOL HCL 5 MG/ML IV SOLN
5.0000 mg | Freq: Once | INTRAVENOUS | Status: AC
  Administered 2017-05-10: 5 mg via INTRAVENOUS

## 2017-05-10 MED ORDER — HEMOSTATIC AGENTS (NO CHARGE) OPTIME
TOPICAL | Status: DC | PRN
  Administered 2017-05-10: 1 via TOPICAL

## 2017-05-10 MED ORDER — ONDANSETRON HCL 4 MG/2ML IJ SOLN
INTRAMUSCULAR | Status: DC | PRN
  Administered 2017-05-10: 4 mg via INTRAVENOUS

## 2017-05-10 MED ORDER — CALCIUM CARBONATE 1250 (500 CA) MG PO TABS
2.0000 | ORAL_TABLET | Freq: Three times a day (TID) | ORAL | Status: DC
  Administered 2017-05-10 – 2017-05-11 (×3): 1000 mg via ORAL
  Filled 2017-05-10 (×3): qty 1

## 2017-05-10 MED ORDER — DEXAMETHASONE SODIUM PHOSPHATE 10 MG/ML IJ SOLN
INTRAMUSCULAR | Status: DC | PRN
  Administered 2017-05-10: 10 mg via INTRAVENOUS

## 2017-05-10 MED ORDER — METFORMIN HCL 500 MG PO TABS
1000.0000 mg | ORAL_TABLET | Freq: Two times a day (BID) | ORAL | Status: DC
  Administered 2017-05-10 – 2017-05-11 (×2): 1000 mg via ORAL
  Filled 2017-05-10 (×2): qty 2

## 2017-05-10 MED ORDER — ACETAMINOPHEN 650 MG RE SUPP: 650.0000 mg | Freq: Four times a day (QID) | RECTAL | Status: DC | PRN

## 2017-05-10 MED ORDER — ROCURONIUM BROMIDE 10 MG/ML (PF) SYRINGE
PREFILLED_SYRINGE | INTRAVENOUS | Status: DC | PRN
Start: 2017-05-10 — End: 2017-05-10
  Administered 2017-05-10: 20 mg via INTRAVENOUS
  Administered 2017-05-10: 40 mg via INTRAVENOUS

## 2017-05-10 SURGICAL SUPPLY — 86 items
ADAPTER BRONCH F/PENTAX (ADAPTER) ×2 IMPLANT
ADPR BSCP EDG PNTX (ADAPTER) ×1
ATTRACTOMAT 16X20 MAGNETIC DRP (DRAPES) ×2 IMPLANT
BLADE CLIPPER SURG (BLADE) IMPLANT
BLADE SURG 10 STRL SS (BLADE) ×2 IMPLANT
BLADE SURG 15 STRL LF DISP TIS (BLADE) ×1 IMPLANT
BLADE SURG 15 STRL SS (BLADE) ×2
BRUSH BIOPSY BRONCH 10 SDTNB (MISCELLANEOUS) IMPLANT
BRUSH SUPERTRAX BIOPSY (INSTRUMENTS) IMPLANT
BRUSH SUPERTRAX NDL-TIP CYTO (INSTRUMENTS) ×2 IMPLANT
CANISTER SUCT 3000ML PPV (MISCELLANEOUS) ×4 IMPLANT
CHANNEL WORK EXTEND EDGE 180 (KITS) ×1 IMPLANT
CHANNEL WORK EXTEND EDGE 45 (KITS) IMPLANT
CHANNEL WORK EXTEND EDGE 90 (KITS) IMPLANT
CHLORAPREP W/TINT 10.5 ML (MISCELLANEOUS) ×2 IMPLANT
CLIP VESOCCLUDE MED 24/CT (CLIP) ×2 IMPLANT
CLIP VESOCCLUDE SM WIDE 24/CT (CLIP) ×2 IMPLANT
CLSR STERI-STRIP ANTIMIC 1/2X4 (GAUZE/BANDAGES/DRESSINGS) ×1 IMPLANT
CONT SPEC 4OZ CLIKSEAL STRL BL (MISCELLANEOUS) ×4 IMPLANT
COVER BACK TABLE 60X90IN (DRAPES) ×2 IMPLANT
COVER SURGICAL LIGHT HANDLE (MISCELLANEOUS) ×2 IMPLANT
CRADLE DONUT ADULT HEAD (MISCELLANEOUS) ×2 IMPLANT
DRAPE LAPAROTOMY 100X72 PEDS (DRAPES) ×2 IMPLANT
DRAPE UTILITY XL STRL (DRAPES) ×2 IMPLANT
ELECT CAUTERY BLADE 6.4 (BLADE) ×2 IMPLANT
ELECT REM PT RETURN 9FT ADLT (ELECTROSURGICAL) ×2
ELECTRODE REM PT RTRN 9FT ADLT (ELECTROSURGICAL) ×1 IMPLANT
FILTER STRAW FLUID ASPIR (MISCELLANEOUS) IMPLANT
FORCEPS BIOP SUPERTRX PREMAR (INSTRUMENTS) IMPLANT
GAUZE SPONGE 4X4 12PLY STRL (GAUZE/BANDAGES/DRESSINGS) ×4 IMPLANT
GAUZE SPONGE 4X4 12PLY STRL LF (GAUZE/BANDAGES/DRESSINGS) ×1 IMPLANT
GAUZE SPONGE 4X4 16PLY XRAY LF (GAUZE/BANDAGES/DRESSINGS) ×3 IMPLANT
GLOVE BIO SURGEON STRL SZ7.5 (GLOVE) ×1 IMPLANT
GLOVE BIOGEL PI IND STRL 7.0 (GLOVE) IMPLANT
GLOVE BIOGEL PI IND STRL 7.5 (GLOVE) IMPLANT
GLOVE BIOGEL PI INDICATOR 7.0 (GLOVE) ×1
GLOVE BIOGEL PI INDICATOR 7.5 (GLOVE) ×1
GLOVE SURG ORTHO 8.0 STRL STRW (GLOVE) ×2 IMPLANT
GLOVE SURG SIGNA 7.5 PF LTX (GLOVE) ×2 IMPLANT
GLOVE SURG SS PI 6.5 STRL IVOR (GLOVE) ×1 IMPLANT
GOWN STRL REUS W/ TWL LRG LVL3 (GOWN DISPOSABLE) ×1 IMPLANT
GOWN STRL REUS W/ TWL XL LVL3 (GOWN DISPOSABLE) ×2 IMPLANT
GOWN STRL REUS W/TWL LRG LVL3 (GOWN DISPOSABLE) ×2
GOWN STRL REUS W/TWL XL LVL3 (GOWN DISPOSABLE) ×4
HEMOSTAT SURGICEL 2X4 FIBR (HEMOSTASIS) ×2 IMPLANT
ILLUMINATOR WAVEGUIDE N/F (MISCELLANEOUS) ×1 IMPLANT
KIT BASIN OR (CUSTOM PROCEDURE TRAY) ×2 IMPLANT
KIT CLEAN ENDO COMPLIANCE (KITS) ×2 IMPLANT
KIT PROCEDURE EDGE 180 (KITS) ×1 IMPLANT
KIT PROCEDURE EDGE 45 (KITS) IMPLANT
KIT PROCEDURE EDGE 90 (KITS) IMPLANT
KIT ROOM TURNOVER OR (KITS) ×4 IMPLANT
LIGHT WAVEGUIDE WIDE FLAT (MISCELLANEOUS) IMPLANT
MARKER SKIN DUAL TIP RULER LAB (MISCELLANEOUS) ×2 IMPLANT
NDL SUPERTRX PREMARK BIOPSY (NEEDLE) IMPLANT
NEEDLE SUPERTRX PREMARK BIOPSY (NEEDLE) IMPLANT
NS IRRIG 1000ML POUR BTL (IV SOLUTION) ×4 IMPLANT
OIL SILICONE PENTAX (PARTS (SERVICE/REPAIRS)) ×2 IMPLANT
PACK SURGICAL SETUP 50X90 (CUSTOM PROCEDURE TRAY) ×2 IMPLANT
PAD ARMBOARD 7.5X6 YLW CONV (MISCELLANEOUS) ×6 IMPLANT
PATCHES PATIENT (LABEL) ×6 IMPLANT
PENCIL BUTTON HOLSTER BLD 10FT (ELECTRODE) ×2 IMPLANT
SHEARS HARMONIC 9CM CVD (BLADE) ×2 IMPLANT
SPECIMEN JAR MEDIUM (MISCELLANEOUS) ×2 IMPLANT
SPONGE INTESTINAL PEANUT (DISPOSABLE) ×2 IMPLANT
STRIP CLOSURE SKIN 1/2X4 (GAUZE/BANDAGES/DRESSINGS) ×2 IMPLANT
SUT MNCRL AB 4-0 PS2 18 (SUTURE) ×3 IMPLANT
SUT SILK 2 0 (SUTURE) ×4
SUT SILK 2-0 18XBRD TIE 12 (SUTURE) ×1 IMPLANT
SUT VIC AB 3-0 SH 18 (SUTURE) ×5 IMPLANT
SYR 20CC LL (SYRINGE) ×2 IMPLANT
SYR 20ML ECCENTRIC (SYRINGE) ×2 IMPLANT
SYR 30ML LL (SYRINGE) ×2 IMPLANT
SYR 5ML LL (SYRINGE) ×2 IMPLANT
SYR BULB 3OZ (MISCELLANEOUS) ×2 IMPLANT
TAPE CLOTH SURG 4X10 WHT LF (GAUZE/BANDAGES/DRESSINGS) ×1 IMPLANT
TOWEL GREEN STERILE (TOWEL DISPOSABLE) ×2 IMPLANT
TOWEL GREEN STERILE FF (TOWEL DISPOSABLE) ×2 IMPLANT
TOWEL OR 17X24 6PK STRL BLUE (TOWEL DISPOSABLE) ×2 IMPLANT
TOWEL OR 17X26 10 PK STRL BLUE (TOWEL DISPOSABLE) ×2 IMPLANT
TRAP SPECIMEN MUCOUS 40CC (MISCELLANEOUS) ×2 IMPLANT
TUBE CONNECTING 12X1/4 (SUCTIONS) ×2 IMPLANT
TUBE CONNECTING 20X1/4 (TUBING) ×4 IMPLANT
UNDERPAD 30X30 (UNDERPADS AND DIAPERS) ×2 IMPLANT
VALVE DISPOSABLE (MISCELLANEOUS) ×3 IMPLANT
WATER STERILE IRR 1000ML POUR (IV SOLUTION) ×2 IMPLANT

## 2017-05-10 NOTE — Anesthesia Postprocedure Evaluation (Signed)
Anesthesia Post Note  Patient: Karen West  Procedure(s) Performed: VIDEO BRONCHOSCOPY WITH ENDOBRONCHIAL NAVIGATION (N/A ) TOTAL THYROIDECTOMY (N/A )     Patient location during evaluation: PACU Anesthesia Type: General Level of consciousness: awake Pain management: pain level controlled Vital Signs Assessment: post-procedure vital signs reviewed and stable Respiratory status: spontaneous breathing Cardiovascular status: stable Anesthetic complications: no    Last Vitals:  Vitals:   05/10/17 1246 05/10/17 1309  BP:  (!) 177/75  Pulse:  92  Resp:  16  Temp: 36.6 C 36.8 C  SpO2:  97%    Last Pain:  Vitals:   05/10/17 1309  TempSrc: Oral  PainSc: 10-Worst pain ever                 Shemuel Harkleroad

## 2017-05-10 NOTE — Anesthesia Procedure Notes (Addendum)
Procedure Name: Intubation Date/Time: 05/10/2017 8:07 AM Performed by: Harden Mo, CRNA Pre-anesthesia Checklist: Patient identified, Emergency Drugs available, Suction available and Patient being monitored Patient Re-evaluated:Patient Re-evaluated prior to induction Oxygen Delivery Method: Circle System Utilized and Circle system utilized Preoxygenation: Pre-oxygenation with 100% oxygen Induction Type: IV induction Ventilation: Mask ventilation without difficulty and Oral airway inserted - appropriate to patient size Laryngoscope Size: Miller and 2 Grade View: Grade I Tube type: Oral Tube size: 8.5 mm Number of attempts: 1 Airway Equipment and Method: Stylet and Oral airway Placement Confirmation: ETT inserted through vocal cords under direct vision,  positive ETCO2 and breath sounds checked- equal and bilateral Secured at: 23 cm Tube secured with: Tape Dental Injury: Teeth and Oropharynx as per pre-operative assessment  Comments: Intubation by Girard Cooter, SRNA.

## 2017-05-10 NOTE — Interval H&P Note (Signed)
History and Physical Interval Note:  05/10/2017 7:40 AM  Karen West  has presented today for surgery, with the diagnosis of LUL NODULE  The various methods of treatment have been discussed with the patient and family. After consideration of risks, benefits and other options for treatment, the patient has consented to  Procedure(s): VIDEO BRONCHOSCOPY WITH ENDOBRONCHIAL NAVIGATION (N/A) TOTAL THYROIDECTOMY (N/A) as a surgical intervention .  The patient's history has been reviewed, patient examined, no change in status, stable for surgery.  I have reviewed the patient's chart and labs.  Questions were answered to the patient's satisfaction.     [name]

## 2017-05-10 NOTE — Interval H&P Note (Signed)
History and Physical Interval Note:  05/10/2017 10:54 AM  Karen West  has presented today for surgery, with the diagnosis of LUL NODULE . The various methods of treatment have been discussed with the patient and family. After consideration of risks, benefits and other options for treatment, the patient has consented to    Procedure(s): VIDEO BRONCHOSCOPY WITH ENDOBRONCHIAL NAVIGATION (N/A) TOTAL THYROIDECTOMY (N/A) as a surgical intervention .    The patient's history has been reviewed, patient examined, no change in status, stable for surgery.  I have reviewed the patient's chart and labs.  Questions were answered to the patient's satisfaction.    Armandina Gemma, [place] [facility] Office: [place]

## 2017-05-10 NOTE — Op Note (Signed)
Procedure Note  Pre-operative Diagnosis:  Multinodular thyroid goiter, hyperthyroidism   Post-operative Diagnosis:  same  Surgeon:  Armandina Gemma, MD  Assistant:  Sharyn Dross, RNFA   Procedure:  Total thyroidectomy  Anesthesia:  General  Estimated Blood Loss:  minimal  Drains: none         Specimen: thyroid to pathology  Indications:  Patient is referred by Dr. Jacelyn Pi for evaluation of multinodular thyroid goiter, hyperthyroidism, and tracheal deviation. Patient has had a known thyroid goiter for many years. She was diagnosed by her primary care physician, Dr. Deland Pretty. She initially took low-dose thyroid hormone suppression. Patient has developed mild hyperthyroidism and levothyroxine has been discontinued. Patient has been followed with ultrasound examination and reports having had 1 percutaneous biopsy performed which was benign. She has recently been noted to have a pulmonary nodule. Part of her evaluation has included a PET scan performed on February 28, 2017. This showed a hypermetabolic 3.1 cm nodule in the left side of the isthmus which has increased in size. Risk of thyroid malignancy exists. CT scan of the chest also showed thyroid nodules with tracheal deviation towards the right. Most recent TSH level is suppressed at 0.16. Patient notes mild dyspnea on occasion. She denies any significant dysphagia. She has had no prior head or neck surgery. There is no family history of thyroid disease. Patient presents today to discuss possible thyroidectomy.  Procedure Details: Procedure was done in OR #10 at the [facility].  The patient was brought to the operating room and placed in a supine position on the operating room table.  Following administration of general anesthesia, the patient was positioned and then prepped and draped in the usual aseptic fashion.  After ascertaining that an adequate level of anesthesia had been achieved, a Kocher incision was  made with #15 blade.  Dissection was carried through subcutaneous tissues and platysma. Hemostasis was achieved with the electrocautery.  Skin flaps were elevated cephalad and caudad from the thyroid notch to the sternal notch.  The Mahorner self-retaining retractor was placed for exposure.  Strap muscles were incised in the midline and dissection was begun on the left side.  Strap muscles were reflected laterally.  Left thyroid lobe was markedly enlarged and multinodular.  The left lobe was gently mobilized with blunt dissection.  Superior pole vessels were dissected out and divided individually between small and medium Ligaclips with the Harmonic scalpel.  The thyroid lobe was rolled anteriorly.  Branches of the inferior thyroid artery were divided between small Ligaclips with the Harmonic scalpel.  Inferior venous tributaries were divided between Ligaclips.  Both the superior and inferior parathyroid glands were identified and preserved on their vascular pedicles.  The recurrent laryngeal nerve was identified and preserved along its course.  The ligament of Gwenlyn Found was released with the electrocautery and the gland was mobilized onto the anterior trachea. Isthmus was mobilized across the midline.  There was no significant pyramidal lobe present.  However, there was a large nodule in the left side of the isthmus.  Dry pack was placed in the left neck.  Next, the right thyroid lobe was gently mobilized with blunt dissection.  Right thyroid lobe was enlarged and nodular.  Superior pole vessels were dissected out and divided between small and medium Ligaclips with the Harmonic scalpel.  Superior parathyroid was identified and preserved.  Inferior venous tributaries were divided between medium Ligaclips with the Harmonic scalpel.  The right thyroid lobe was rolled anteriorly and the branches  of the inferior thyroid artery divided between small Ligaclips.  The right recurrent laryngeal nerve was identified and  preserved along its course.  The ligament of Gwenlyn Found was released with the electrocautery.  The right thyroid lobe was mobilized onto the anterior trachea and the remainder of the thyroid was dissected off the anterior trachea and the thyroid was completely excised.  A suture was used to mark the left lobe. The entire thyroid gland was submitted to pathology for review.  The neck was irrigated with warm saline.  Fibrillar was placed throughout the operative field.  Strap muscles were reapproximated in the midline with interrupted 3-0 Vicryl sutures.  Platysma was closed with interrupted 3-0 Vicryl sutures.  Skin was closed with a running 4-0 Monocryl subcuticular suture.  Wound was washed and dried and steri-strips were applied.  Dry gauze dressing was placed.  The patient was awakened from anesthesia and brought to the recovery room.  The patient tolerated the procedure well.   Armandina Gemma, MD [facility] Office: [phone]

## 2017-05-10 NOTE — Transfer of Care (Signed)
Immediate Anesthesia Transfer of Care Note  Patient: Karen West  Procedure(s) Performed: VIDEO BRONCHOSCOPY WITH ENDOBRONCHIAL NAVIGATION (N/A ) TOTAL THYROIDECTOMY (N/A )  Patient Location: PACU  Anesthesia Type:General  Level of Consciousness: awake and alert   Airway & Oxygen Therapy: Patient Spontanous Breathing and Patient connected to face mask oxygen  Post-op Assessment: Report given to RN, Post -op Vital signs reviewed and stable and Patient moving all extremities X 4  Post vital signs: Reviewed and stable  Last Vitals:  Vitals:   05/10/17 0642  BP: (!) 158/68  Pulse: 86  Resp: 20  Temp: 36.5 C  SpO2: 99%    Last Pain:  Vitals:   05/10/17 0644  TempSrc:   PainSc: 6          Complications: No apparent anesthesia complications

## 2017-05-10 NOTE — Anesthesia Preprocedure Evaluation (Signed)
Anesthesia Evaluation  Patient identified by MRN, date of birth, ID band Patient awake    Reviewed: Allergy & Precautions, NPO status , Patient's Chart, lab work & pertinent test results  Airway Mallampati: II  TM Distance: >3 FB     Dental   Pulmonary shortness of breath, sleep apnea ,    breath sounds clear to auscultation       Cardiovascular hypertension, + angina + Peripheral Vascular Disease  + dysrhythmias  Rhythm:Regular Rate:Normal     Neuro/Psych    GI/Hepatic Neg liver ROS, GERD  ,  Endo/Other  diabetesHyperthyroidism   Renal/GU Renal disease     Musculoskeletal  (+) Arthritis ,   Abdominal   Peds  Hematology  (+) anemia ,   Anesthesia Other Findings   Reproductive/Obstetrics                             Anesthesia Physical Anesthesia Plan  ASA: III  Anesthesia Plan: General   Post-op Pain Management:    Induction: Intravenous  PONV Risk Score and Plan: 3 and Treatment may vary due to age or medical condition, Ondansetron, Dexamethasone and Midazolam  Airway Management Planned: Oral ETT  Additional Equipment:   Intra-op Plan:   Post-operative Plan: Possible Post-op intubation/ventilation  Informed Consent: I have reviewed the patients History and Physical, chart, labs and discussed the procedure including the risks, benefits and alternatives for the proposed anesthesia with the patient or authorized representative who has indicated his/her understanding and acceptance.   Dental advisory given  Plan Discussed with: CRNA and Anesthesiologist  Anesthesia Plan Comments:         Anesthesia Quick Evaluation

## 2017-05-10 NOTE — Progress Notes (Signed)
Received pt from PACU, A&O x4. Neck dressing dry and intact. Pt c/o headache, with relief from Tylenol. 1700 sitting up in the chair for dinner, tolerated.

## 2017-05-11 ENCOUNTER — Encounter (HOSPITAL_COMMUNITY): Payer: Self-pay | Admitting: Thoracic Surgery (Cardiothoracic Vascular Surgery)

## 2017-05-11 DIAGNOSIS — E0520 Thyrotoxicosis with toxic multinodular goiter without thyrotoxic crisis or storm: Secondary | ICD-10-CM | POA: Diagnosis not present

## 2017-05-11 DIAGNOSIS — E7800 Pure hypercholesterolemia, unspecified: Secondary | ICD-10-CM | POA: Diagnosis not present

## 2017-05-11 DIAGNOSIS — K219 Gastro-esophageal reflux disease without esophagitis: Secondary | ICD-10-CM | POA: Diagnosis not present

## 2017-05-11 DIAGNOSIS — E1151 Type 2 diabetes mellitus with diabetic peripheral angiopathy without gangrene: Secondary | ICD-10-CM | POA: Diagnosis not present

## 2017-05-11 DIAGNOSIS — C3412 Malignant neoplasm of upper lobe, left bronchus or lung: Secondary | ICD-10-CM | POA: Diagnosis not present

## 2017-05-11 DIAGNOSIS — G4730 Sleep apnea, unspecified: Secondary | ICD-10-CM | POA: Diagnosis not present

## 2017-05-11 LAB — BASIC METABOLIC PANEL
Anion gap: 15 (ref 5–15)
BUN: 16 mg/dL (ref 6–20)
CO2: 26 mmol/L (ref 22–32)
Calcium: 9.9 mg/dL (ref 8.9–10.3)
Chloride: 98 mmol/L — ABNORMAL LOW (ref 101–111)
Creatinine, Ser: 1.06 mg/dL — ABNORMAL HIGH (ref 0.44–1.00)
GFR calc Af Amer: 58 mL/min — ABNORMAL LOW (ref >60)
GFR calc non Af Amer: 50 mL/min — ABNORMAL LOW (ref >60)
Glucose, Bld: 241 mg/dL — ABNORMAL HIGH (ref 65–99)
Potassium: 3.4 mmol/L — ABNORMAL LOW (ref 3.5–5.1)
Sodium: 139 mmol/L (ref 135–145)

## 2017-05-11 LAB — GLUCOSE, CAPILLARY
Glucose-Capillary: 222 mg/dL — ABNORMAL HIGH (ref 65–99)
Glucose-Capillary: 165 mg/dL — ABNORMAL HIGH (ref 65–99)

## 2017-05-11 MED ORDER — CALCIUM CARBONATE ANTACID 500 MG PO CHEW: 2.0000 | CHEWABLE_TABLET | Freq: Two times a day (BID) | ORAL | 1 refills | Status: DC

## 2017-05-11 MED ORDER — LEVOTHYROXINE SODIUM 75 MCG PO TABS: 75.0000 ug | ORAL_TABLET | Freq: Every day | ORAL | 3 refills | Status: DC

## 2017-05-11 NOTE — Progress Notes (Signed)
Karen West to be D/C'd Home per MD order. Discussed with the patient and all questions fully answered.  Allergies as of 05/11/2017      Reactions   Tramadol Nausea And Vomiting, Other (See Comments)   "got sick to my stomach and was throwing up blood; it put me in the hospital")   Lisinopril Rash, Other (See Comments)   Renal failure   Tapazole [methimazole] Itching, Rash      Medication List    TAKE these medications   acetaminophen 500 MG tablet Commonly known as:  TYLENOL Take 1,000 mg by mouth every 6 (six) hours as needed for moderate pain or headache.   amLODipine 10 MG tablet Commonly known as:  NORVASC Take 10 mg by mouth daily.   aspirin 81 MG tablet Take 81 mg by mouth daily.   calcium carbonate 500 MG chewable tablet Commonly known as:  TUMS Chew 2 tablets (400 mg of elemental calcium total) by mouth 2 (two) times daily.   capsaicin 0.025 % cream Commonly known as:  ZOSTRIX Apply 1 application topically daily as needed (for knee pain).   ezetimibe 10 MG tablet Commonly known as:  ZETIA Take 5 mg by mouth daily.   Fish Oil 1200 MG Caps Take 1,200 mg by mouth daily.   glipiZIDE 10 MG 24 hr tablet Commonly known as:  GLUCOTROL XL Take 10 mg by mouth 2 (two) times daily.   hydrochlorothiazide 12.5 MG tablet Commonly known as:  HYDRODIURIL Take 12.5 mg by mouth daily.   levothyroxine 75 MCG tablet Commonly known as:  SYNTHROID Take 1 tablet (75 mcg total) by mouth daily before breakfast.   metFORMIN 1000 MG tablet Commonly known as:  GLUCOPHAGE Take 1,000 mg by mouth 2 (two) times daily.   omeprazole 20 MG capsule Commonly known as:  PRILOSEC Take 20 mg by mouth 2 (two) times daily.   rosuvastatin 20 MG tablet Commonly known as:  CRESTOR Take 20 mg by mouth daily.   Vitamin D3 1000 units Caps Take 1,000 Units by mouth daily.       VVS, Skin clean, dry and intact without evidence of skin break down, no evidence of skin tears noted.  IV  catheter discontinued intact. Site without signs and symptoms of complications. Dressing and pressure applied.  An After Visit Summary was printed and given to the patient.  Patient escorted via [place], and D/C home via private auto.  Fabian November  05/11/2017 1:36 PM

## 2017-05-11 NOTE — Discharge Summary (Signed)
Physician Discharge Summary [facility]  Patient ID: Karen West MRN: [id number] DOB/AGE: [birth year] 75 y.o.  Admit date: 05/10/2017 Discharge date: 05/11/2017  Admission Diagnoses:  Multinodular thyroid goiter, hyperthyroidism  Discharge Diagnoses:  Principal Problem:   Hyperthyroidism Active Problems:   Multiple thyroid nodules   Abnormal positron emission tomography (PET) scan   Discharged Condition: good  Hospital Course: Patient was admitted for observation following thyroid surgery.  Post op course was uncomplicated.  Pain was well controlled.  Tolerated diet.  Post op calcium level on morning following surgery was 9.9 mg/dl.  Patient was prepared for discharge home on POD#1.  Consults: None  Treatments: surgery: total thyroidectomy, bronchoscopy with lung biopsy by Dr. Roxan Hockey  Discharge Exam: Blood pressure (!) 161/88, pulse 94, temperature 98.2 F (36.8 C), resp. rate 17, height 5\' 2"  (1.575 m), weight 72.3 kg (159 lb 8 oz), SpO2 95 %. HEENT - clear Neck - wound dry and intact; mild STS; voice slightly hoarse; no stridor Chest - clear bilaterally Cor - RRR  Disposition: Home  Discharge Instructions    Diet - low sodium heart healthy   Complete by:  As directed    Increase activity slowly   Complete by:  As directed    Remove dressing in 24 hours   Complete by:  As directed      Allergies as of 05/11/2017      Reactions   Tramadol Nausea And Vomiting, Other (See Comments)   "got sick to my stomach and was throwing up blood; it put me in the hospital")   Lisinopril Rash, Other (See Comments)   Renal failure   Tapazole [methimazole] Itching, Rash      Medication List    TAKE these medications   acetaminophen 500 MG tablet Commonly known as:  TYLENOL Take 1,000 mg by mouth every 6 (six) hours as needed for moderate pain or headache.   amLODipine 10 MG tablet Commonly known as:  NORVASC Take 10 mg by mouth daily.   aspirin  81 MG tablet Take 81 mg by mouth daily.   calcium carbonate 500 MG chewable tablet Commonly known as:  TUMS Chew 2 tablets (400 mg of elemental calcium total) by mouth 2 (two) times daily.   capsaicin 0.025 % cream Commonly known as:  ZOSTRIX Apply 1 application topically daily as needed (for knee pain).   ezetimibe 10 MG tablet Commonly known as:  ZETIA Take 5 mg by mouth daily.   Fish Oil 1200 MG Caps Take 1,200 mg by mouth daily.   glipiZIDE 10 MG 24 hr tablet Commonly known as:  GLUCOTROL XL Take 10 mg by mouth 2 (two) times daily.   hydrochlorothiazide 12.5 MG tablet Commonly known as:  HYDRODIURIL Take 12.5 mg by mouth daily.   metFORMIN 1000 MG tablet Commonly known as:  GLUCOPHAGE Take 1,000 mg by mouth 2 (two) times daily.   omeprazole 20 MG capsule Commonly known as:  PRILOSEC Take 20 mg by mouth 2 (two) times daily.   rosuvastatin 20 MG tablet Commonly known as:  CRESTOR Take 20 mg by mouth daily.   Vitamin D3 1000 units Caps Take 1,000 Units by mouth daily.      Follow-up Information    Armandina Gemma, MD. Schedule an appointment as soon as possible for a visit in 3 week(s).   Specialty:  General Surgery Contact information: [address] [place] [address] [phone]  Earnstine Regal, MD, [facility] Office: [phone]   Signed: Earnstine Regal 05/11/2017, 8:10 AM

## 2017-05-14 NOTE — Op Note (Signed)
NAME:  Karen West, MOLLA NO.:  [id number]  MEDICAL RECORD NO.:  [id number]  LOCATION:                                 FACILITY:  PHYSICIAN:  Revonda Standard. Roxan Hockey, M.D. DATE OF BIRTH:  DATE OF PROCEDURE:  05/10/2017 DATE OF DISCHARGE:                              OPERATIVE REPORT   PREOPERATIVE DIAGNOSIS:  Left upper lobe lung nodule.  POSTOPERATIVE DIAGNOSIS:  Left upper lobe lung nodule.  PROCEDURE:  Electromagnetic navigational bronchoscopy with needle aspirations, brushings, and biopsies.  SURGEON:  Revonda Standard. Roxan Hockey, MD.  ASSISTANT:  None.  ANESTHESIA:  General.  FINDINGS:  Nodule in a relatively central location, near bifurcation of the bronchus making the catheter difficult to keep engaged and aligned with the nodule.  Initial specimens were nondiagnostic.  CLINICAL NOTE:  Ms. Karen West is a 77 year old woman with a history of a multinodular goiter, type 2 diabetes, atherosclerotic cardiovascular disease, and left mastectomy for breast cancer.  She is a lifelong nonsmoker.  She was found to have a lung nodule in 2010.  It was an incidental finding on a CT done for research study.  Over the next couple of years, her nodules remained stable on CT scan.  She saw Dr. Chase Caller in November and a CT of the chest showed an increase in the size of the left upper lobe nodule in comparison to 2012.  On PET-CT, it was mildly hypermetabolic with an SUV of 2.1.  There was a more dramatically hypermetabolic nodule in the left isthmus of her thyroid. She saw Dr. Harlow Asa in regard to her thyroid.  He recommended thyroidectomy.  She wished to have navigational bronchoscopy of the lung nodule done at the same setting.  The indications, risks, benefits, alternatives, and limitations of navigational bronchoscopy were discussed in detail with the patient.  She understood and accepted the risks and agreed to proceed.  OPERATIVE NOTE:  Ms. Karen West was brought to the  operating room on May 10, 2017.  She had induction of general anesthesia and was intubated.  After performing a time-out, flexible fiberoptic bronchoscopy was performed via the endotracheal tube.  It revealed normal endobronchial anatomy with no endobronchial lesions to the level of subsegmental bronchi.  The locatable guide for navigation was placed. The preoperative plan was already loaded into the computer.  The bronchoscope was advanced to the left upper lobe bronchus and the appropriate subsegmental bronchus was cannulated.  The nodule was relatively central and it was very close to a bifurcation of the bronchus.  The bronchus leading to the nodule came off at a more acute angle than the ongoing bronchus and the catheter tended to go down the ongoing bronchus rather than becoming aligned with the nodule.  With manipulation, the catheter would come in relatively close proximity with the nodule, but never directly centered on the nodule.  Needle aspirations and brushings were performed.  The needle aspirations were sent for immediate evaluation as were the brushings.  While awaiting those results, multiple biopsies were taken.  The catheter continuously required repositioning due to slight movements and becoming disengaged from the bronchus and the locatable guide was replaced between every 2-3 samples to check for continued  alignment.  The initial specimens returned with no tumor seen.  A bronchoalveolar lavage was performed with 100 mL of saline instilled and 20 mL returned.  This will be sent for cytology.  The bronchoscope was removed.  The patient remained intubated with plan for Dr. Harlow Asa to proceed with thyroidectomy.     Revonda Standard Roxan Hockey, M.D.     SCH/MEDQ  D:  05/14/2017  T:  05/14/2017  Job:  [id number]

## 2017-05-15 ENCOUNTER — Ambulatory Visit (INDEPENDENT_AMBULATORY_CARE_PROVIDER_SITE_OTHER): Payer: Medicare Other | Admitting: Thoracic Surgery (Cardiothoracic Vascular Surgery)

## 2017-05-15 ENCOUNTER — Other Ambulatory Visit: Payer: Self-pay

## 2017-05-15 ENCOUNTER — Encounter: Payer: Self-pay | Admitting: Thoracic Surgery (Cardiothoracic Vascular Surgery)

## 2017-05-15 VITALS — BP 151/73 | HR 100 | Resp 18 | Ht 62.0 in | Wt 155.6 lb

## 2017-05-15 DIAGNOSIS — C3492 Malignant neoplasm of unspecified part of left bronchus or lung: Secondary | ICD-10-CM | POA: Diagnosis not present

## 2017-05-15 NOTE — Progress Notes (Addendum)
[address]       [place] [address]             [phone]     HPI: Karen West returns to discuss the results of her navigational bronchoscopy.  Karen West is a 77 year old woman with a history of multinodular goiter, hypertension, hyperlipidemia, reflux, left mastectomy for breast cancer, obstructive sleep apnea, type 2 diabetes, aortic atherosclerosis, carotid artery disease, and a right bundle branch block.  She is a lifelong non-smoker but her husband did smoke.  She was first found to have a lung nodule in 2010.  That was an incidental finding on a CT done for research study.  Over the next couple of years the nodule remained stable and she was lost to follow-up.  She saw Dr. Chase Caller in November complained of shortness of breath.  CT of the chest showed a left upper lobe nodule increased in size from 2012.  The nodule was hypermetabolic on PET.  He also had a hypermetabolic thyroid nodule.  Dr. Harlow Asa did a thyroidectomy on her last week.  I did a navigational bronchoscopy at the same setting..  She did well postoperatively and went home the following day.  The thyroid turned out to be multinodular hyperplasia.  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  She feels well.  She has minimal pain from the incision.  Past Medical History:  Diagnosis Date  . Atherosclerosis of aorta ([place])   . Breast cancer ([place])    "cancer in both breasts; s/p left mastectomy  . Carotid artery disease ([place])    08/3147 w/ 70-26% LICA stenosis and <37% RICA stenosis; < 50% BICA stenosis 2017  . Chronic anemia   . Chronic headaches   . Dyspnea   . GERD (gastroesophageal reflux disease)   . Hyperlipidemia   . Hypertension   . Hyperthyroidism   . Multinodular goiter   . Obesity   . OSA on CPAP    cpap  . Osteoarthritis   . Personal history of radiation therapy 1999  . Right bundle branch block   . Type II or unspecified type diabetes  mellitus without mention of complication, uncontrolled     Current Outpatient Medications  Medication Sig Dispense Refill  . acetaminophen (TYLENOL) 500 MG tablet Take 1,000 mg by mouth every 6 (six) hours as needed for moderate pain or headache.    Marland Kitchen amLODipine (NORVASC) 10 MG tablet Take 10 mg by mouth daily.    Marland Kitchen aspirin 81 MG tablet Take 81 mg by mouth daily.      . calcium carbonate (TUMS) 500 MG chewable tablet Chew 2 tablets (400 mg of elemental calcium total) by mouth 2 (two) times daily. 90 tablet 1  . capsaicin (ZOSTRIX) 0.025 % cream Apply 1 application topically daily as needed (for knee pain).    . Cholecalciferol (VITAMIN D3) 1000 units CAPS Take 1,000 Units by mouth daily.    Marland Kitchen ezetimibe (ZETIA) 10 MG tablet Take 5 mg by mouth daily.     Marland Kitchen glipiZIDE (GLUCOTROL) 10 MG 24 hr tablet Take 10 mg by mouth 2 (two) times daily.      . hydrochlorothiazide (HYDRODIURIL) 12.5 MG tablet Take 12.5 mg by mouth daily.     Marland Kitchen levothyroxine (SYNTHROID) 75 MCG tablet Take 1 tablet (75 mcg total) by mouth daily before breakfast. 30 tablet 3  . metFORMIN (GLUCOPHAGE) 1000 MG tablet Take 1,000 mg by mouth 2 (two) times  daily.     . Omega-3 Fatty Acids (FISH OIL) 1200 MG CAPS Take 1,200 mg by mouth daily.     Marland Kitchen omeprazole (PRILOSEC) 20 MG capsule Take 20 mg by mouth 2 (two) times daily.     . rosuvastatin (CRESTOR) 20 MG tablet Take 20 mg by mouth daily.     No current facility-administered medications for this visit.     Physical Exam BP (!) 151/73 (BP Location: Right Arm, Patient Position: Sitting, Cuff Size: Large)   Pulse 100   Resp 18   Ht 5\' 2"  (1.575 m)   Wt 155 lb 9.6 oz (70.6 kg)   SpO2 97% Comment: RA  BMI 28.58 kg/m  77 year old woman in no acute distress Neck incision intact with no erythema or drainage Lungs clear bilaterally  Diagnostic Tests: NUCLEAR MEDICINE PET SKULL BASE TO THIGH  TECHNIQUE: 9.2 mCi F-18 FDG was injected intravenously. Full-ring PET imaging was  performed from the skull base to thigh after the radiotracer. CT data was obtained and used for attenuation correction and anatomic localization.  FASTING BLOOD GLUCOSE:  Value: 82 mg/dl  COMPARISON:  Chest CT on 09/09/2010  FINDINGS: NECK: No hypermetabolic lymph nodes identified. Multinodular goiter again seen. A 3.1 cm nodule in the left isthmus shows increased size from approximately 2.2 cm in 2012. This shows internal hypermetabolic activity, with SUV max of 5.2. There is also a small focus of hypermetabolic activity seen in lateral right lobe with SUV max of 3.8.  CHEST: A 1.7 cm irregular pulmonary nodule in the posterior left upper lobe (image 25/8) is increased in size from 1.3 cm in 2012. This shows low-grade FDG uptake, with SUV max of 2.1.  Asymmetric FDG uptake is seen in the left hilum, with SUV max of 3.8. A 10 mm right paratracheal mediastinal lymph node shows FDG uptake, with SUV max of 3.5.  Previous left mastectomy. No evidence of hypermetabolic axillary lymphadenopathy.  ABDOMEN/PELVIS: No abnormal hypermetabolic activity within the liver, pancreas, adrenal glands, or spleen. No hypermetabolic lymph nodes in the abdomen or pelvis.  Bilateral low attenuation and hyperdense renal cysts are seen which show no evidence of hypermetabolic activity. Aortic atherosclerosis. Several small uterine fibroids also seen without hypermetabolic activity. Left colonic diverticulosis is noted, without evidence of diverticulitis. A tiny paraumbilical hernia seen containing only fat.  SKELETON: No focal hypermetabolic bone lesions to suggest skeletal metastasis.  IMPRESSION: Slowly enlarging 1.7 cm left upper lobe pulmonary nodule with low-grade metabolic activity, suspicious for low-grade adenocarcinoma.  Mild FDG uptake in left hilum and 1 cm right paratracheal lymph node. Lymph node metastases cannot be excluded.  No evidence of metastatic disease  within the neck, abdomen, or pelvis.  Multinodular goiter, with mild enlargement of 3.1 cm nodule in the left isthmus which shows hypermetabolic activity. Another focus of hypermetabolic activity is seen in the lateral right thyroid lobe. Thyroid carcinoma cannot be excluded. Recommend thyroid ultrasound for further evaluation.   Electronically Signed   By: Earle Gell M.D.   On: 02/28/2017 14:15 I personally reviewed the PET/CT Karen West reviewed the PET/CT and concur FINAL DIAG I personally NOSIS Diagnosis 1. 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 SEE NOTE 2. Thyroid, thyroidectomy, total - MULTINODULAR HYPERPLASIA - NEGATIVE FOR MALIGNANCY  Pulmonary function testing FVC 1.53 (76%) FEV1 1.26 (82%) FEV1 1.34 (86%) postbronchodilator TLC 4.47 (94%) DLCO 13.07 (60%)  Impression: Karen West is a 77 year old non-smoker with multiple medical problems including a multinodular goiter, history of breast  cancer, hypertension, hyperlipidemia, right bundle branch block, atherosclerotic cardiovascular disease, and type 2 diabetes.  She was first found to have a lung nodule back in 2010.  This remained stable over a couple of years and was lost to follow-up.  Recently a CT of the chest showed the nodule had increased in size.  The nodule was hypermetabolic on PET.  By biopsy it is an adenocarcinoma.  She is clinical stage IA, T1N0, although there is some uptake lymph nodes are not pathologically enlarged.  Had a long discussion with Mrs. Levitz regarding potential treatments.  Treatments will include surgery versus radiation assuming the mediastinal nodes are uninvolved.  We discussed the relative advantages and disadvantages of each approach.  She has adequate pulmonary function to tolerate a lobectomy and that will give her a better chance of a cure.  I recommended that we do a left VATS for left upper lobectomy.  I described the general nature of the operation  to her including the Jah, the incisions to be used, the use of a drainage tube postoperatively, the expected hospital stay, and the overall recovery.  I informed her of the indications, risks, benefits, and alternatives.  She understands the risks include, but not limited to death, MI, DVT, PE, bleeding, possible need for transfusion, infection, stroke, prolonged air leak, cardiac arrhythmias, as well as the possibility of other unforeseeable complications.  We will need to clear the mediastinal lymph nodes prior to resection.  The nodes being relatively small I think mediastinoscopy to be a better option than endobronchial ultrasound.  We do want to give her some time to recover from her thyroidectomy before subjecting her to another operation.  We are tentatively planning to proceed on Monday, March 18.  Plan: Mediastinoscopy and left VATS for left upper lobectomy on 06/04/2017  [name], MD [facility] [phone]  05/16/17 Given recent thyroidectomy, mediastinoscopy is not advisable. Will do EBUS prior to VATS  Remo Lipps C. Roxan Hockey, MD [facility] [phone]

## 2017-05-15 NOTE — H&P (View-Only) (Signed)
[address]       [place] [address]             [phone]     HPI: Karen West returns to discuss the results of her navigational bronchoscopy.  Karen West is a 77 year old woman with a history of multinodular goiter, hypertension, hyperlipidemia, reflux, left mastectomy for breast cancer, obstructive sleep apnea, type 2 diabetes, aortic atherosclerosis, carotid artery disease, and a right bundle branch block.  She is a lifelong non-smoker but her husband did smoke.  She was first found to have a lung nodule in 2010.  That was an incidental finding on a CT done for research study.  Over the next couple of years the nodule remained stable and she was lost to follow-up.  She saw Dr. Chase Caller in November complained of shortness of breath.  CT of the chest showed a left upper lobe nodule increased in size from 2012.  The nodule was hypermetabolic on PET.  He also had a hypermetabolic thyroid nodule.  Dr. Harlow Asa did a thyroidectomy on her last week.  I did a navigational bronchoscopy at the same setting..  She did well postoperatively and went home the following day.  The thyroid turned out to be multinodular hyperplasia.  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  She feels well.  She has minimal pain from the incision.  Past Medical History:  Diagnosis Date  . Atherosclerosis of aorta ([place])   . Breast cancer ([place])    "cancer in both breasts; s/p left mastectomy  . Carotid artery disease ([place])    09/960 w/ 83-66% LICA stenosis and <29% RICA stenosis; < 50% BICA stenosis 2017  . Chronic anemia   . Chronic headaches   . Dyspnea   . GERD (gastroesophageal reflux disease)   . Hyperlipidemia   . Hypertension   . Hyperthyroidism   . Multinodular goiter   . Obesity   . OSA on CPAP    cpap  . Osteoarthritis   . Personal history of radiation therapy 1999  . Right bundle branch block   . Type II or unspecified type diabetes  mellitus without mention of complication, uncontrolled     Current Outpatient Medications  Medication Sig Dispense Refill  . acetaminophen (TYLENOL) 500 MG tablet Take 1,000 mg by mouth every 6 (six) hours as needed for moderate pain or headache.    Marland Kitchen amLODipine (NORVASC) 10 MG tablet Take 10 mg by mouth daily.    Marland Kitchen aspirin 81 MG tablet Take 81 mg by mouth daily.      . calcium carbonate (TUMS) 500 MG chewable tablet Chew 2 tablets (400 mg of elemental calcium total) by mouth 2 (two) times daily. 90 tablet 1  . capsaicin (ZOSTRIX) 0.025 % cream Apply 1 application topically daily as needed (for knee pain).    . Cholecalciferol (VITAMIN D3) 1000 units CAPS Take 1,000 Units by mouth daily.    Marland Kitchen ezetimibe (ZETIA) 10 MG tablet Take 5 mg by mouth daily.     Marland Kitchen glipiZIDE (GLUCOTROL) 10 MG 24 hr tablet Take 10 mg by mouth 2 (two) times daily.      . hydrochlorothiazide (HYDRODIURIL) 12.5 MG tablet Take 12.5 mg by mouth daily.     Marland Kitchen levothyroxine (SYNTHROID) 75 MCG tablet Take 1 tablet (75 mcg total) by mouth daily before breakfast. 30 tablet 3  . metFORMIN (GLUCOPHAGE) 1000 MG tablet Take 1,000 mg by mouth 2 (two) times  daily.     . Omega-3 Fatty Acids (FISH OIL) 1200 MG CAPS Take 1,200 mg by mouth daily.     Marland Kitchen omeprazole (PRILOSEC) 20 MG capsule Take 20 mg by mouth 2 (two) times daily.     . rosuvastatin (CRESTOR) 20 MG tablet Take 20 mg by mouth daily.     No current facility-administered medications for this visit.     Physical Exam BP (!) 151/73 (BP Location: Right Arm, Patient Position: Sitting, Cuff Size: Large)   Pulse 100   Resp 18   Ht 5\' 2"  (1.575 m)   Wt 155 lb 9.6 oz (70.6 kg)   SpO2 97% Comment: RA  BMI 28.45 kg/m  77 year old woman in no acute distress Neck incision intact with no erythema or drainage Lungs clear bilaterally  Diagnostic Tests: NUCLEAR MEDICINE PET SKULL BASE TO THIGH  TECHNIQUE: 9.2 mCi F-18 FDG was injected intravenously. Full-ring PET imaging was  performed from the skull base to thigh after the radiotracer. CT data was obtained and used for attenuation correction and anatomic localization.  FASTING BLOOD GLUCOSE:  Value: 82 mg/dl  COMPARISON:  Chest CT on 09/09/2010  FINDINGS: NECK: No hypermetabolic lymph nodes identified. Multinodular goiter again seen. A 3.1 cm nodule in the left isthmus shows increased size from approximately 2.2 cm in 2012. This shows internal hypermetabolic activity, with SUV max of 5.2. There is also a small focus of hypermetabolic activity seen in lateral right lobe with SUV max of 3.8.  CHEST: A 1.7 cm irregular pulmonary nodule in the posterior left upper lobe (image 25/8) is increased in size from 1.3 cm in 2012. This shows low-grade FDG uptake, with SUV max of 2.1.  Asymmetric FDG uptake is seen in the left hilum, with SUV max of 3.8. A 10 mm right paratracheal mediastinal lymph node shows FDG uptake, with SUV max of 3.5.  Previous left mastectomy. No evidence of hypermetabolic axillary lymphadenopathy.  ABDOMEN/PELVIS: No abnormal hypermetabolic activity within the liver, pancreas, adrenal glands, or spleen. No hypermetabolic lymph nodes in the abdomen or pelvis.  Bilateral low attenuation and hyperdense renal cysts are seen which show no evidence of hypermetabolic activity. Aortic atherosclerosis. Several small uterine fibroids also seen without hypermetabolic activity. Left colonic diverticulosis is noted, without evidence of diverticulitis. A tiny paraumbilical hernia seen containing only fat.  SKELETON: No focal hypermetabolic bone lesions to suggest skeletal metastasis.  IMPRESSION: Slowly enlarging 1.7 cm left upper lobe pulmonary nodule with low-grade metabolic activity, suspicious for low-grade adenocarcinoma.  Mild FDG uptake in left hilum and 1 cm right paratracheal lymph node. Lymph node metastases cannot be excluded.  No evidence of metastatic disease  within the neck, abdomen, or pelvis.  Multinodular goiter, with mild enlargement of 3.1 cm nodule in the left isthmus which shows hypermetabolic activity. Another focus of hypermetabolic activity is seen in the lateral right thyroid lobe. Thyroid carcinoma cannot be excluded. Recommend thyroid ultrasound for further evaluation.   Electronically Signed   By: Earle Gell M.D.   On: 02/28/2017 14:15 I personally reviewed the PET/CT Nally reviewed the PET/CT and concur FINAL DIAG I personally NOSIS Diagnosis 1. 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 SEE NOTE 2. Thyroid, thyroidectomy, total - MULTINODULAR HYPERPLASIA - NEGATIVE FOR MALIGNANCY  Pulmonary function testing FVC 1.53 (76%) FEV1 1.26 (82%) FEV1 1.34 (86%) postbronchodilator TLC 4.47 (94%) DLCO 13.07 (60%)  Impression: Evanie Buckle is a 77 year old non-smoker with multiple medical problems including a multinodular goiter, history of breast  cancer, hypertension, hyperlipidemia, right bundle branch block, atherosclerotic cardiovascular disease, and type 2 diabetes.  She was first found to have a lung nodule back in 2010.  This remained stable over a couple of years and was lost to follow-up.  Recently a CT of the chest showed the nodule had increased in size.  The nodule was hypermetabolic on PET.  By biopsy it is an adenocarcinoma.  She is clinical stage IA, T1N0, although there is some uptake lymph nodes are not pathologically enlarged.  Had a long discussion with Mrs. Sesay regarding potential treatments.  Treatments will include surgery versus radiation assuming the mediastinal nodes are uninvolved.  We discussed the relative advantages and disadvantages of each approach.  She has adequate pulmonary function to tolerate a lobectomy and that will give her a better chance of a cure.  I recommended that we do a left VATS for left upper lobectomy.  I described the general nature of the operation  to her including the Jah, the incisions to be used, the use of a drainage tube postoperatively, the expected hospital stay, and the overall recovery.  I informed her of the indications, risks, benefits, and alternatives.  She understands the risks include, but not limited to death, MI, DVT, PE, bleeding, possible need for transfusion, infection, stroke, prolonged air leak, cardiac arrhythmias, as well as the possibility of other unforeseeable complications.  We will need to clear the mediastinal lymph nodes prior to resection.  The nodes being relatively small I think mediastinoscopy to be a better option than endobronchial ultrasound.  We do want to give her some time to recover from her thyroidectomy before subjecting her to another operation.  We are tentatively planning to proceed on Monday, March 18.  Plan: Mediastinoscopy and left VATS for left upper lobectomy on 06/04/2017  [name], MD [facility] [phone]  05/16/17 Given recent thyroidectomy, mediastinoscopy is not advisable. Will do EBUS prior to VATS  Remo Lipps C. Roxan Hockey, MD [facility] [phone]

## 2017-05-16 ENCOUNTER — Other Ambulatory Visit: Payer: Self-pay | Admitting: *Deleted

## 2017-05-16 DIAGNOSIS — C3412 Malignant neoplasm of upper lobe, left bronchus or lung: Secondary | ICD-10-CM

## 2017-05-17 ENCOUNTER — Other Ambulatory Visit: Payer: Self-pay | Admitting: *Deleted

## 2017-05-17 DIAGNOSIS — C3412 Malignant neoplasm of upper lobe, left bronchus or lung: Secondary | ICD-10-CM

## 2017-05-21 DIAGNOSIS — E876 Hypokalemia: Secondary | ICD-10-CM | POA: Diagnosis not present

## 2017-05-21 DIAGNOSIS — E0590 Thyrotoxicosis, unspecified without thyrotoxic crisis or storm: Secondary | ICD-10-CM | POA: Diagnosis not present

## 2017-05-22 DIAGNOSIS — E890 Postprocedural hypothyroidism: Secondary | ICD-10-CM | POA: Diagnosis not present

## 2017-05-30 NOTE — Pre-Procedure Instructions (Signed)
Karen West  05/30/2017      [id number] [address] [place] [address] Phone: [phone] Fax: [phone]    Your procedure is scheduled on Monday March 18.  Report to [facility] Admitting at 5:30 A.M.  Call this number if you have problems the morning of surgery:  [phone]   Remember:  Do not eat food or drink liquids after midnight.  Take these medicines the morning of surgery with A SIP OF WATER:   Amlodipine (norvasc) Levothyroxine (synthroid) Omeprazole (prilosec)  DO NOT take Metformin (Glucophage), or Glipizide (Glucotrol) the day of surgery.  7 days prior to surgery STOP taking any Aleve, Naproxen, Ibuprofen, Motrin, Advil, Goody's, BC's, all herbal medications, fish oil, and all vitamins  **Follow your surgeon's instructions on stopping Aspirin. If no instructions were given, please call your surgeon's office**     How to Manage Your Diabetes Before and After Surgery  Why is it important to control my blood sugar before and after surgery? . Improving blood sugar levels before and after surgery helps healing and can limit problems. . A way of improving blood sugar control is eating a healthy diet by: o  Eating less sugar and carbohydrates o  Increasing activity/exercise o  Talking with your doctor about reaching your blood sugar goals . High blood sugars (greater than 180 mg/dL) can raise your risk of infections and slow your recovery, so you will need to focus on controlling your diabetes during the weeks before surgery. . Make sure that the doctor who takes care of your diabetes knows about your planned surgery including the date and location.  How do I manage my blood sugar before surgery? . Check your blood sugar at least 4 times a day, starting 2 days before surgery, to make sure that the level is not too high or low. o Check your blood sugar the morning of your  surgery when you wake up and every 2 hours until you get to the Short Stay unit. . If your blood sugar is less than 70 mg/dL, you will need to treat for low blood sugar: o Do not take insulin. o Treat a low blood sugar (less than 70 mg/dL) with  cup of clear juice (cranberry or apple), 4 glucose tablets, OR glucose gel. Recheck blood sugar in 15 minutes after treatment (to make sure it is greater than 70 mg/dL). If your blood sugar is not greater than 70 mg/dL on recheck, call [phone] o  for further instructions. . Report your blood sugar to the short stay nurse when you get to Short Stay.  . If you are admitted to the hospital after surgery: o Your blood sugar will be checked by the staff and you will probably be given insulin after surgery (instead of oral diabetes medicines) to make sure you have good blood sugar levels. o The goal for blood sugar control after surgery is 80-180 mg/dL.              Do not wear jewelry, make-up or nail polish.  Do not wear lotions, powders, or perfumes, or deodorant.  Do not shave 48 hours prior to surgery.  Men may shave face and neck.  Do not bring valuables to the hospital.  [facility] is not responsible for any belongings or valuables.  Contacts, dentures or bridgework may not be worn into surgery.  Leave your suitcase in the car.  After  surgery it may be brought to your room.  For patients admitted to the hospital, discharge time will be determined by your treatment team.  Patients discharged the day of surgery will not be allowed to drive home.   Special instructions:    [facility]- Preparing For Surgery  Before surgery, you can play an important role. Because skin is not sterile, your skin needs to be as free of germs as possible. You can reduce the number of germs on your skin by washing with CHG (chlorahexidine gluconate) Soap before surgery.  CHG is an antiseptic cleaner which kills germs and bonds with the skin to continue  killing germs even after washing.  Please do not use if you have an allergy to CHG or antibacterial soaps. If your skin becomes reddened/irritated stop using the CHG.  Do not shave (including legs and underarms) for at least 48 hours prior to first CHG shower. It is OK to shave your face.  Please follow these instructions carefully.   1. Shower the NIGHT BEFORE SURGERY and the MORNING OF SURGERY with CHG.   2. If you chose to wash your hair, wash your hair first as usual with your normal shampoo.  3. After you shampoo, rinse your hair and body thoroughly to remove the shampoo.  4. Use CHG as you would any other liquid soap. You can apply CHG directly to the skin and wash gently with a scrungie or a clean washcloth.   5. Apply the CHG Soap to your body ONLY FROM THE NECK DOWN.  Do not use on open wounds or open sores. Avoid contact with your eyes, ears, mouth and genitals (private parts). Wash Face and genitals (private parts)  with your normal soap.  6. Wash thoroughly, paying special attention to the area where your surgery will be performed.  7. Thoroughly rinse your body with warm water from the neck down.  8. DO NOT shower/wash with your normal soap after using and rinsing off the CHG Soap.  9. Pat yourself dry with a CLEAN TOWEL.  10. Wear CLEAN PAJAMAS to bed the night before surgery, wear comfortable clothes the morning of surgery  11. Place CLEAN SHEETS on your bed the night of your first shower and DO NOT SLEEP WITH PETS.    Day of Surgery: Do not apply any deodorants/lotions. Please wear clean clothes to the hospital/surgery center.      Please read over the following fact sheets that you were given. Coughing and Deep Breathing, MRSA Information and Surgical Site Infection Prevention

## 2017-05-31 ENCOUNTER — Encounter (HOSPITAL_COMMUNITY): Payer: Self-pay

## 2017-05-31 ENCOUNTER — Encounter (HOSPITAL_COMMUNITY)
Admission: RE | Admit: 2017-05-31 | Discharge: 2017-05-31 | Disposition: A | Discharge status: Home / Self Care | Payer: Medicare Other | Source: Ambulatory Visit | Attending: Thoracic Surgery (Cardiothoracic Vascular Surgery) | Admitting: Thoracic Surgery (Cardiothoracic Vascular Surgery)

## 2017-05-31 ENCOUNTER — Other Ambulatory Visit: Payer: Self-pay

## 2017-05-31 ENCOUNTER — Ambulatory Visit: Payer: Medicare Other | Admitting: Internal Medicine

## 2017-05-31 DIAGNOSIS — E785 Hyperlipidemia, unspecified: Secondary | ICD-10-CM | POA: Diagnosis not present

## 2017-05-31 DIAGNOSIS — K219 Gastro-esophageal reflux disease without esophagitis: Secondary | ICD-10-CM | POA: Diagnosis not present

## 2017-05-31 DIAGNOSIS — C3412 Malignant neoplasm of upper lobe, left bronchus or lung: Secondary | ICD-10-CM | POA: Diagnosis not present

## 2017-05-31 DIAGNOSIS — G4733 Obstructive sleep apnea (adult) (pediatric): Secondary | ICD-10-CM | POA: Insufficient documentation

## 2017-05-31 DIAGNOSIS — E119 Type 2 diabetes mellitus without complications: Secondary | ICD-10-CM | POA: Insufficient documentation

## 2017-05-31 DIAGNOSIS — I4510 Unspecified right bundle-branch block: Secondary | ICD-10-CM | POA: Insufficient documentation

## 2017-05-31 DIAGNOSIS — E0590 Thyrotoxicosis, unspecified without thyrotoxic crisis or storm: Secondary | ICD-10-CM | POA: Insufficient documentation

## 2017-05-31 DIAGNOSIS — I10 Essential (primary) hypertension: Secondary | ICD-10-CM | POA: Diagnosis not present

## 2017-05-31 DIAGNOSIS — D6489 Other specified anemias: Secondary | ICD-10-CM | POA: Insufficient documentation

## 2017-05-31 LAB — URINALYSIS, ROUTINE W REFLEX MICROSCOPIC
Bilirubin Urine: NEGATIVE
Glucose, UA: NEGATIVE mg/dL
Hgb urine dipstick: NEGATIVE
Ketones, ur: NEGATIVE mg/dL
Leukocytes, UA: NEGATIVE
Nitrite: NEGATIVE
Protein, ur: 30 mg/dL — AB
Specific Gravity, Urine: 1.006 (ref 1.005–1.030)
Squamous Epithelial / LPF: NONE SEEN
pH: 7 (ref 5.0–8.0)

## 2017-05-31 LAB — CBC
HCT: 33.8 % — ABNORMAL LOW (ref 36.0–46.0)
Hemoglobin: 11.1 g/dL — ABNORMAL LOW (ref 12.0–15.0)
MCH: 28 pg (ref 26.0–34.0)
MCHC: 32.8 g/dL (ref 30.0–36.0)
MCV: 85.1 fL (ref 78.0–100.0)
Platelets: 304 10*3/uL (ref 150–400)
RBC: 3.97 MIL/uL (ref 3.87–5.11)
RDW: 14.5 % (ref 11.5–15.5)
WBC: 7.2 10*3/uL (ref 4.0–10.5)

## 2017-05-31 LAB — COMPREHENSIVE METABOLIC PANEL
ALT: 22 U/L (ref 14–54)
AST: 24 U/L (ref 15–41)
Albumin: 4 g/dL (ref 3.5–5.0)
Alkaline Phosphatase: 56 U/L (ref 38–126)
Anion gap: 12 (ref 5–15)
BUN: 20 mg/dL (ref 6–20)
CO2: 23 mmol/L (ref 22–32)
Calcium: 10.2 mg/dL (ref 8.9–10.3)
Chloride: 101 mmol/L (ref 101–111)
Creatinine, Ser: 0.99 mg/dL (ref 0.44–1.00)
GFR calc Af Amer: >60 mL/min (ref >60)
GFR calc non Af Amer: 55 mL/min — ABNORMAL LOW (ref >60)
Glucose, Bld: 103 mg/dL — ABNORMAL HIGH (ref 65–99)
Potassium: 3.6 mmol/L (ref 3.5–5.1)
Sodium: 136 mmol/L (ref 135–145)
Total Bilirubin: 0.9 mg/dL (ref 0.3–1.2)
Total Protein: 7.4 g/dL (ref 6.5–8.1)

## 2017-05-31 LAB — BLOOD GAS, ARTERIAL
Acid-Base Excess: 2.6 mmol/L — ABNORMAL HIGH (ref 0.0–2.0)
Bicarbonate: 26.1 mmol/L (ref 20.0–28.0)
Drawn by: 421801
FIO2: 21
O2 Saturation: 98.5 %
Patient temperature: 98.6
pCO2 arterial: 36.8 mmHg (ref 32.0–48.0)
pH, Arterial: 7.465 — ABNORMAL HIGH (ref 7.350–7.450)
pO2, Arterial: 122 mmHg — ABNORMAL HIGH (ref 83.0–108.0)

## 2017-05-31 LAB — PROTIME-INR
INR: 0.92
Prothrombin Time: 12.3 seconds (ref 11.4–15.2)

## 2017-05-31 LAB — TYPE AND SCREEN
ABO/RH(D): B POS
Antibody Screen: NEGATIVE

## 2017-05-31 LAB — APTT: aPTT: 27 seconds (ref 24–36)

## 2017-05-31 LAB — SURGICAL PCR SCREEN
MRSA, PCR: NEGATIVE
Staphylococcus aureus: NEGATIVE

## 2017-05-31 LAB — GLUCOSE, CAPILLARY: Glucose-Capillary: 115 mg/dL — ABNORMAL HIGH (ref 65–99)

## 2017-05-31 NOTE — Progress Notes (Signed)
PCP: Dr. Deland Pretty Cardiologist: Dr. Einar Gip Pulmonary: Dr. Chase Caller Nephrologist: Dr. Justin Mend  Left message with Franchot Heidelberg @ Dr. Leonarda Salon office, pt. Not instructed on aspirin to stop or not. Instructed pt. If she doesn't hear from her by this afternoon then to call his office for instructions. Pt. Verbalized understanding .

## 2017-06-01 NOTE — Progress Notes (Signed)
Anesthesia Chart Review:  Pt is a 77 year old West scheduled for L VATS, L upper lobectomy, video bronchoscopy with endobronchial ultrasound on 06/04/2017 with Erasmo Leventhal, MD  Providers:  - PCP is Deland Pretty, MD - Pulmonologist is Brand Males, MD - Cardiologist is Kela Millin, MD. Last visit 02/04/16 with Neldon Labella, NP for tachycardia follow-up. Sx improved with b-blocker. Stress and echo discussed (see below) and PRN cardiology follow-up recommended   PMH includes: HTN, DM, hyperlipidemia, RBBB, carotid disease, OSA, hyperthyroidism and multinodular goiter (s/p thryoidectomy 05/10/17), anemia, breast cancer (L mastectomy), GERD.  Never smoker.  BMI 28.  Medications include: Amlodipine, ASA 81 mg, Zetia, glipizide, levothyroxine, metformin, Prilosec, rosuvastatin, Maxzide  BP 138/67   Pulse 88   Temp 36.8 C   Resp 18   Ht 5\' 2"  (1.575 m)   Wt 152 lb 1.6 oz (69 kg)   SpO2 100%   BMI 27.82 kg/m   Preoperative labs reviewed.    CXR 05/09/17:  1. Indistinct left perihilar lung nodule persists. No new cardiopulmonary abnormality. 2. Thyroid goiter with rightward tracheal deviation at the thoracic inlet. 3.  Aortic Atherosclerosis   EKG 05/08/17: NSR. Incomplete RBBB  CT super D chest 04/10/17:  1. Similar appearance of the posterior left upper lobe pulmonary nodule comparing to CT scan of 02/06/2017. This nodule did show low level hypermetabolism on the PET-CT of 02/28/2017. 2. Bulky multinodular thyroid with asymmetric enlargement of the left lobe generating mass-effect on the trachea, similar to prior. 3. Coronary artery and Aortic Atherosclerois  US thyroid 11/06/16:  1. Heterogeneous enlarged thyroid with bilateral nodules. None meets current criteria for biopsy recommendation. 2. Recommend 1-2 year follow-up ultrasound until stability x5 years documented  Echocardiogram 01/27/16 (correspondence 05/12/17 in media tab):  1. Left ventricle cavity is  normal in size.  Moderate concentric hypertrophy of the left ventricle.  Normal global wall motion.  Doppler evidence of grade 1 (impaired) diastolic dysfunction, normal LAP. Calculated EF 55%.  2. Left atrial cavity is moderately dilated at 4.6 cm.  An incidental small patent foramen ovale is probably present.   3. Mild aortic valve leaflet calcification.  Mildly restricted aortic valve leaflets.  No evidence of aortic valve stenosis.  Mild (grade 1) aortic regurgitation.   4. Moderate calcification of the mitral valve annulus.  Trace mitral regurgitation.   5. Mild tricuspid regurgitation.  No evidence of pulmonary hypertension.  Nuclear stress test 01/17/16 (correspondence 05/12/17 in media tab):  1. The resting electrocardiogram demonstrated NSR, right BBB, and no resting arrhythmias.  Stress ECG was normal.  Patient exercised on Bruce protocol for 4 minutes and achieved 4.64 METS.  Stress test terminated due to 100% maximum predicted heart rate achieved (Target HR > 85%).  Symptoms include dyspnea dizziness. 2.  Myocardial perfusion imaging is normal.  Overall left ventricular systolic function was normal without regional wall motion abnormalities.  The left ventricular ejection fraction was 63%.  30 day Holter monitor 11/05/15 (as outlined in [place] [facility] records): One episode of atrial tachycardia, correlated with symptom of dizziness.  Carotid U/S 10/21/15: IMPRESSION: - Left greater than right carotid atherosclerosis. - No significant ICA stenosis, degree of narrowing less than 50% bilaterally. - Patent antegrade vertebral flow bilaterally  If no changes, I anticipate pt can proceed with surgery as scheduled.   Willeen Cass, FNP-BC [facility]/Anesthesiology Phone: [phone] 06/01/2017 9:15 AM

## 2017-06-04 ENCOUNTER — Inpatient Hospital Stay (HOSPITAL_COMMUNITY)
Admission: RE | Admit: 2017-06-04 | Discharge: 2017-06-15 | DRG: 164 | Disposition: A | Discharge status: Home Health | Payer: Medicare Other | Source: Ambulatory Visit | Attending: Thoracic Surgery (Cardiothoracic Vascular Surgery) | Admitting: Thoracic Surgery (Cardiothoracic Vascular Surgery)

## 2017-06-04 ENCOUNTER — Inpatient Hospital Stay (HOSPITAL_COMMUNITY): Payer: Medicare Other | Admitting: Certified Registered Nurse Anesthetist

## 2017-06-04 ENCOUNTER — Inpatient Hospital Stay (HOSPITAL_COMMUNITY): Payer: Medicare Other | Admitting: Emergency Medicine

## 2017-06-04 ENCOUNTER — Inpatient Hospital Stay (HOSPITAL_COMMUNITY): Payer: Medicare Other

## 2017-06-04 ENCOUNTER — Other Ambulatory Visit: Payer: Self-pay

## 2017-06-04 ENCOUNTER — Encounter (HOSPITAL_COMMUNITY)
Admission: RE | Disposition: A | Discharge status: Home Health | Payer: Self-pay | Source: Ambulatory Visit | Attending: Thoracic Surgery (Cardiothoracic Vascular Surgery)

## 2017-06-04 ENCOUNTER — Encounter (HOSPITAL_COMMUNITY): Payer: Self-pay | Admitting: *Deleted

## 2017-06-04 DIAGNOSIS — Z79899 Other long term (current) drug therapy: Secondary | ICD-10-CM

## 2017-06-04 DIAGNOSIS — J939 Pneumothorax, unspecified: Secondary | ICD-10-CM | POA: Diagnosis not present

## 2017-06-04 DIAGNOSIS — Z923 Personal history of irradiation: Secondary | ICD-10-CM

## 2017-06-04 DIAGNOSIS — E119 Type 2 diabetes mellitus without complications: Secondary | ICD-10-CM | POA: Diagnosis present

## 2017-06-04 DIAGNOSIS — I472 Ventricular tachycardia: Secondary | ICD-10-CM | POA: Diagnosis not present

## 2017-06-04 DIAGNOSIS — I700 Atherosclerosis of aorta: Secondary | ICD-10-CM | POA: Diagnosis not present

## 2017-06-04 DIAGNOSIS — Z4682 Encounter for fitting and adjustment of non-vascular catheter: Secondary | ICD-10-CM

## 2017-06-04 DIAGNOSIS — I2510 Atherosclerotic heart disease of native coronary artery without angina pectoris: Secondary | ICD-10-CM | POA: Diagnosis present

## 2017-06-04 DIAGNOSIS — E890 Postprocedural hypothyroidism: Secondary | ICD-10-CM | POA: Diagnosis present

## 2017-06-04 DIAGNOSIS — G4733 Obstructive sleep apnea (adult) (pediatric): Secondary | ICD-10-CM | POA: Diagnosis not present

## 2017-06-04 DIAGNOSIS — C3412 Malignant neoplasm of upper lobe, left bronchus or lung: Secondary | ICD-10-CM

## 2017-06-04 DIAGNOSIS — E669 Obesity, unspecified: Secondary | ICD-10-CM | POA: Diagnosis present

## 2017-06-04 DIAGNOSIS — R9431 Abnormal electrocardiogram [ECG] [EKG]: Secondary | ICD-10-CM

## 2017-06-04 DIAGNOSIS — I10 Essential (primary) hypertension: Secondary | ICD-10-CM | POA: Diagnosis present

## 2017-06-04 DIAGNOSIS — R898 Other abnormal findings in specimens from other organs, systems and tissues: Secondary | ICD-10-CM | POA: Diagnosis not present

## 2017-06-04 DIAGNOSIS — Z9689 Presence of other specified functional implants: Secondary | ICD-10-CM

## 2017-06-04 DIAGNOSIS — E042 Nontoxic multinodular goiter: Secondary | ICD-10-CM | POA: Diagnosis present

## 2017-06-04 DIAGNOSIS — Z7984 Long term (current) use of oral hypoglycemic drugs: Secondary | ICD-10-CM

## 2017-06-04 DIAGNOSIS — S2242XA Multiple fractures of ribs, left side, initial encounter for closed fracture: Secondary | ICD-10-CM | POA: Diagnosis not present

## 2017-06-04 DIAGNOSIS — Z6828 Body mass index (BMI) 28.0-28.9, adult: Secondary | ICD-10-CM

## 2017-06-04 DIAGNOSIS — D62 Acute posthemorrhagic anemia: Secondary | ICD-10-CM | POA: Diagnosis not present

## 2017-06-04 DIAGNOSIS — R911 Solitary pulmonary nodule: Secondary | ICD-10-CM

## 2017-06-04 DIAGNOSIS — R062 Wheezing: Secondary | ICD-10-CM | POA: Diagnosis not present

## 2017-06-04 DIAGNOSIS — E785 Hyperlipidemia, unspecified: Secondary | ICD-10-CM | POA: Diagnosis present

## 2017-06-04 DIAGNOSIS — I361 Nonrheumatic tricuspid (valve) insufficiency: Secondary | ICD-10-CM | POA: Diagnosis not present

## 2017-06-04 DIAGNOSIS — J9811 Atelectasis: Secondary | ICD-10-CM | POA: Diagnosis not present

## 2017-06-04 DIAGNOSIS — Z853 Personal history of malignant neoplasm of breast: Secondary | ICD-10-CM

## 2017-06-04 DIAGNOSIS — I4510 Unspecified right bundle-branch block: Secondary | ICD-10-CM | POA: Diagnosis present

## 2017-06-04 DIAGNOSIS — Z9012 Acquired absence of left breast and nipple: Secondary | ICD-10-CM | POA: Diagnosis not present

## 2017-06-04 DIAGNOSIS — Z833 Family history of diabetes mellitus: Secondary | ICD-10-CM

## 2017-06-04 DIAGNOSIS — Z7982 Long term (current) use of aspirin: Secondary | ICD-10-CM

## 2017-06-04 DIAGNOSIS — Z902 Acquired absence of lung [part of]: Secondary | ICD-10-CM

## 2017-06-04 DIAGNOSIS — K219 Gastro-esophageal reflux disease without esophagitis: Secondary | ICD-10-CM | POA: Diagnosis present

## 2017-06-04 DIAGNOSIS — Z8249 Family history of ischemic heart disease and other diseases of the circulatory system: Secondary | ICD-10-CM

## 2017-06-04 DIAGNOSIS — R000 Tachycardia, unspecified: Secondary | ICD-10-CM | POA: Diagnosis not present

## 2017-06-04 DIAGNOSIS — J9382 Other air leak: Secondary | ICD-10-CM | POA: Diagnosis not present

## 2017-06-04 DIAGNOSIS — Z79890 Hormone replacement therapy (postmenopausal): Secondary | ICD-10-CM

## 2017-06-04 DIAGNOSIS — E876 Hypokalemia: Secondary | ICD-10-CM | POA: Diagnosis not present

## 2017-06-04 DIAGNOSIS — J90 Pleural effusion, not elsewhere classified: Secondary | ICD-10-CM | POA: Diagnosis not present

## 2017-06-04 HISTORY — DX: Atherosclerotic heart disease of native coronary artery without angina pectoris: I25.10

## 2017-06-04 HISTORY — DX: Solitary pulmonary nodule: R91.1

## 2017-06-04 HISTORY — DX: Personal history of other medical treatment: Z92.89

## 2017-06-04 HISTORY — PX: VIDEO ASSISTED THORACOSCOPY (VATS)/ LOBECTOMY: SHX6169

## 2017-06-04 HISTORY — DX: Chest pain, unspecified: R07.9

## 2017-06-04 HISTORY — PX: VIDEO BRONCHOSCOPY WITH ENDOBRONCHIAL ULTRASOUND: SHX6177

## 2017-06-04 LAB — GLUCOSE, CAPILLARY
Glucose-Capillary: 107 mg/dL — ABNORMAL HIGH (ref 65–99)
Glucose-Capillary: 180 mg/dL — ABNORMAL HIGH (ref 65–99)
Glucose-Capillary: 198 mg/dL — ABNORMAL HIGH (ref 65–99)
Glucose-Capillary: 251 mg/dL — ABNORMAL HIGH (ref 65–99)
Glucose-Capillary: 264 mg/dL — ABNORMAL HIGH (ref 65–99)
Glucose-Capillary: 280 mg/dL — ABNORMAL HIGH (ref 65–99)

## 2017-06-04 SURGERY — VIDEO ASSISTED THORACOSCOPY (VATS)/ LOBECTOMY
Anesthesia: General | Site: Chest

## 2017-06-04 MED ORDER — SODIUM CHLORIDE 0.9 % IV SOLN: 1.5000 g | INTRAVENOUS | Status: DC

## 2017-06-04 MED ORDER — PHENYLEPHRINE 40 MCG/ML (10ML) SYRINGE FOR IV PUSH (FOR BLOOD PRESSURE SUPPORT)
PREFILLED_SYRINGE | INTRAVENOUS | Status: AC
  Filled 2017-06-04: qty 10

## 2017-06-04 MED ORDER — OXYCODONE HCL 5 MG PO TABS
5.0000 mg | ORAL_TABLET | ORAL | Status: DC | PRN
  Administered 2017-06-04: 5 mg via ORAL
  Administered 2017-06-05: 10 mg via ORAL
  Administered 2017-06-05: 5 mg via ORAL
  Administered 2017-06-05: 10 mg via ORAL
  Administered 2017-06-05 – 2017-06-08 (×5): 5 mg via ORAL
  Filled 2017-06-04 (×5): qty 1
  Filled 2017-06-04 (×2): qty 2
  Filled 2017-06-04 (×3): qty 1
  Filled 2017-06-04: qty 2
  Filled 2017-06-04: qty 1

## 2017-06-04 MED ORDER — POTASSIUM CHLORIDE 10 MEQ/50ML IV SOLN: 10.0000 meq | Freq: Every day | INTRAVENOUS | Status: DC | PRN

## 2017-06-04 MED ORDER — TRAMADOL HCL 50 MG PO TABS: 50.0000 mg | ORAL_TABLET | Freq: Four times a day (QID) | ORAL | Status: DC | PRN

## 2017-06-04 MED ORDER — FENTANYL CITRATE (PF) 250 MCG/5ML IJ SOLN
INTRAMUSCULAR | Status: AC
  Filled 2017-06-04: qty 5

## 2017-06-04 MED ORDER — HYDROMORPHONE HCL 1 MG/ML IJ SOLN
INTRAMUSCULAR | Status: AC
  Filled 2017-06-04: qty 1

## 2017-06-04 MED ORDER — PROPOFOL 10 MG/ML IV BOLUS
INTRAVENOUS | Status: AC
  Filled 2017-06-04: qty 40

## 2017-06-04 MED ORDER — HEMOSTATIC AGENTS (NO CHARGE) OPTIME
TOPICAL | Status: DC | PRN
  Administered 2017-06-04: 1 via TOPICAL

## 2017-06-04 MED ORDER — ROCURONIUM BROMIDE 100 MG/10ML IV SOLN
INTRAVENOUS | Status: DC | PRN
  Administered 2017-06-04 (×2): 20 mg via INTRAVENOUS
  Administered 2017-06-04: 50 mg via INTRAVENOUS
  Administered 2017-06-04: 10 mg via INTRAVENOUS

## 2017-06-04 MED ORDER — ORAL CARE MOUTH RINSE
15.0000 mL | Freq: Two times a day (BID) | OROMUCOSAL | Status: DC
  Administered 2017-06-04 – 2017-06-14 (×12): 15 mL via OROMUCOSAL

## 2017-06-04 MED ORDER — PANTOPRAZOLE SODIUM 40 MG PO TBEC
40.0000 mg | DELAYED_RELEASE_TABLET | Freq: Every day | ORAL | Status: DC
  Administered 2017-06-05 – 2017-06-15 (×11): 40 mg via ORAL
  Filled 2017-06-04 (×11): qty 1

## 2017-06-04 MED ORDER — MIDAZOLAM HCL 2 MG/2ML IJ SOLN
INTRAMUSCULAR | Status: AC
  Filled 2017-06-04: qty 2

## 2017-06-04 MED ORDER — ALBUMIN HUMAN 5 % IV SOLN
INTRAVENOUS | Status: DC | PRN
  Administered 2017-06-04: 11:00:00 via INTRAVENOUS

## 2017-06-04 MED ORDER — SENNOSIDES-DOCUSATE SODIUM 8.6-50 MG PO TABS
1.0000 | ORAL_TABLET | Freq: Every day | ORAL | Status: DC
  Administered 2017-06-04 – 2017-06-13 (×4): 1 via ORAL
  Filled 2017-06-04 (×8): qty 1

## 2017-06-04 MED ORDER — CEFAZOLIN SODIUM-DEXTROSE 2-4 GM/100ML-% IV SOLN
2.0000 g | Freq: Once | INTRAVENOUS | Status: AC
  Administered 2017-06-04: 2 g via INTRAVENOUS
  Filled 2017-06-04: qty 100

## 2017-06-04 MED ORDER — LACTATED RINGERS IV SOLN
INTRAVENOUS | Status: DC | PRN
  Administered 2017-06-04 (×2): via INTRAVENOUS

## 2017-06-04 MED ORDER — ONDANSETRON HCL 4 MG/2ML IJ SOLN: 4.0000 mg | Freq: Once | INTRAMUSCULAR | Status: DC | PRN

## 2017-06-04 MED ORDER — ACETAMINOPHEN 500 MG PO TABS
1000.0000 mg | ORAL_TABLET | Freq: Four times a day (QID) | ORAL | Status: AC
  Administered 2017-06-04 – 2017-06-08 (×15): 1000 mg via ORAL
  Filled 2017-06-04 (×17): qty 2

## 2017-06-04 MED ORDER — NALOXONE HCL 0.4 MG/ML IJ SOLN: 0.4000 mg | INTRAMUSCULAR | Status: DC | PRN

## 2017-06-04 MED ORDER — KETOROLAC TROMETHAMINE 60 MG/2ML IM SOLN
INTRAMUSCULAR | Status: AC
  Filled 2017-06-04: qty 2

## 2017-06-04 MED ORDER — LIDOCAINE HCL (CARDIAC) 20 MG/ML IV SOLN
INTRAVENOUS | Status: DC | PRN
  Administered 2017-06-04: 100 mg via INTRAVENOUS

## 2017-06-04 MED ORDER — FENTANYL CITRATE (PF) 100 MCG/2ML IJ SOLN: 25.0000 ug | INTRAMUSCULAR | Status: DC | PRN

## 2017-06-04 MED ORDER — FENTANYL 40 MCG/ML IV SOLN
INTRAVENOUS | Status: AC
  Administered 2017-06-04: 1000 ug via INTRAVENOUS
  Filled 2017-06-04: qty 25

## 2017-06-04 MED ORDER — SODIUM CHLORIDE 0.9 % IJ SOLN
INTRAMUSCULAR | Status: DC | PRN
  Administered 2017-06-04: 50 mL

## 2017-06-04 MED ORDER — HYDROMORPHONE HCL 1 MG/ML IJ SOLN
0.2500 mg | INTRAMUSCULAR | Status: DC | PRN
  Administered 2017-06-04: 0.5 mg via INTRAVENOUS
  Administered 2017-06-04: .25 mg via INTRAVENOUS
  Administered 2017-06-04: 0.5 mg via INTRAVENOUS
  Administered 2017-06-04: .25 mg via INTRAVENOUS

## 2017-06-04 MED ORDER — ONDANSETRON HCL 4 MG/2ML IJ SOLN
INTRAMUSCULAR | Status: DC | PRN
  Administered 2017-06-04: 4 mg via INTRAVENOUS

## 2017-06-04 MED ORDER — BUPIVACAINE HCL (PF) 0.5 % IJ SOLN
INTRAMUSCULAR | Status: AC
  Filled 2017-06-04: qty 30

## 2017-06-04 MED ORDER — DEXTROSE-NACL 5-0.45 % IV SOLN
INTRAVENOUS | Status: DC
  Administered 2017-06-04 (×2): via INTRAVENOUS

## 2017-06-04 MED ORDER — BISACODYL 5 MG PO TBEC
10.0000 mg | DELAYED_RELEASE_TABLET | Freq: Every day | ORAL | Status: DC
  Administered 2017-06-04 – 2017-06-14 (×7): 10 mg via ORAL
  Filled 2017-06-04 (×12): qty 2

## 2017-06-04 MED ORDER — KETOROLAC TROMETHAMINE 30 MG/ML IJ SOLN
60.0000 mg | Freq: Once | INTRAMUSCULAR | Status: AC
  Administered 2017-06-04: 60 mg via INTRAVENOUS

## 2017-06-04 MED ORDER — LEVOTHYROXINE SODIUM 75 MCG PO TABS
75.0000 ug | ORAL_TABLET | Freq: Every day | ORAL | Status: DC
  Administered 2017-06-05 – 2017-06-15 (×11): 75 ug via ORAL
  Filled 2017-06-04 (×12): qty 1

## 2017-06-04 MED ORDER — SUGAMMADEX SODIUM 200 MG/2ML IV SOLN
INTRAVENOUS | Status: DC | PRN
  Administered 2017-06-04: 137.8 mg via INTRAVENOUS

## 2017-06-04 MED ORDER — EPINEPHRINE PF 1 MG/ML IJ SOLN
INTRAMUSCULAR | Status: AC
  Filled 2017-06-04: qty 1

## 2017-06-04 MED ORDER — SODIUM CHLORIDE 0.9% FLUSH
10.0000 mL | Freq: Two times a day (BID) | INTRAVENOUS | Status: DC
  Administered 2017-06-04 – 2017-06-07 (×4): 10 mL
  Administered 2017-06-08: 20 mL
  Administered 2017-06-08 – 2017-06-09 (×2): 10 mL
  Administered 2017-06-09: 20 mL
  Administered 2017-06-10 (×2): 10 mL
  Administered 2017-06-11: 20 mL
  Administered 2017-06-11 – 2017-06-14 (×7): 10 mL

## 2017-06-04 MED ORDER — ROCURONIUM BROMIDE 10 MG/ML (PF) SYRINGE
PREFILLED_SYRINGE | INTRAVENOUS | Status: AC
  Filled 2017-06-04: qty 5

## 2017-06-04 MED ORDER — KETOROLAC TROMETHAMINE 60 MG/2ML IM SOLN: 60.0000 mg | Freq: Once | INTRAMUSCULAR | Status: DC

## 2017-06-04 MED ORDER — SODIUM CHLORIDE 0.9% FLUSH: 9.0000 mL | INTRAVENOUS | Status: DC | PRN

## 2017-06-04 MED ORDER — EZETIMIBE 10 MG PO TABS
5.0000 mg | ORAL_TABLET | Freq: Every day | ORAL | Status: DC
  Administered 2017-06-05 – 2017-06-15 (×11): 5 mg via ORAL
  Filled 2017-06-04 (×13): qty 1

## 2017-06-04 MED ORDER — 0.9 % SODIUM CHLORIDE (POUR BTL) OPTIME
TOPICAL | Status: DC | PRN
  Administered 2017-06-04: 3000 mL

## 2017-06-04 MED ORDER — INSULIN ASPART 100 UNIT/ML ~~LOC~~ SOLN
0.0000 [IU] | SUBCUTANEOUS | Status: DC
  Administered 2017-06-04 (×3): 12 [IU] via SUBCUTANEOUS
  Administered 2017-06-05: 4 [IU] via SUBCUTANEOUS

## 2017-06-04 MED ORDER — PROPOFOL 10 MG/ML IV BOLUS
INTRAVENOUS | Status: DC | PRN
  Administered 2017-06-04: 140 mg via INTRAVENOUS

## 2017-06-04 MED ORDER — ACETAMINOPHEN 160 MG/5ML PO SOLN: 1000.0000 mg | Freq: Four times a day (QID) | ORAL | Status: AC

## 2017-06-04 MED ORDER — MEPERIDINE HCL 50 MG/ML IJ SOLN: 6.2500 mg | INTRAMUSCULAR | Status: DC | PRN

## 2017-06-04 MED ORDER — EPINEPHRINE PF 1 MG/10ML IJ SOSY
PREFILLED_SYRINGE | INTRAMUSCULAR | Status: AC
Start: 2017-06-04 — End: 2017-06-04
  Filled 2017-06-04: qty 10

## 2017-06-04 MED ORDER — ONDANSETRON HCL 4 MG/2ML IJ SOLN: 4.0000 mg | Freq: Four times a day (QID) | INTRAMUSCULAR | Status: DC | PRN

## 2017-06-04 MED ORDER — ROSUVASTATIN CALCIUM 20 MG PO TABS
20.0000 mg | ORAL_TABLET | Freq: Every day | ORAL | Status: DC
  Administered 2017-06-05 – 2017-06-15 (×11): 20 mg via ORAL
  Filled 2017-06-04 (×11): qty 1

## 2017-06-04 MED ORDER — SODIUM CHLORIDE 0.9% FLUSH: 10.0000 mL | INTRAVENOUS | Status: DC | PRN

## 2017-06-04 MED ORDER — ENOXAPARIN SODIUM 40 MG/0.4ML ~~LOC~~ SOLN
40.0000 mg | Freq: Every day | SUBCUTANEOUS | Status: DC
  Administered 2017-06-04 – 2017-06-12 (×8): 40 mg via SUBCUTANEOUS
  Filled 2017-06-04 (×10): qty 0.4

## 2017-06-04 MED ORDER — CEFAZOLIN SODIUM-DEXTROSE 2-4 GM/100ML-% IV SOLN
2.0000 g | Freq: Three times a day (TID) | INTRAVENOUS | Status: AC
  Administered 2017-06-04 (×2): 2 g via INTRAVENOUS
  Filled 2017-06-04 (×2): qty 100

## 2017-06-04 MED ORDER — PHENYLEPHRINE 40 MCG/ML (10ML) SYRINGE FOR IV PUSH (FOR BLOOD PRESSURE SUPPORT)
PREFILLED_SYRINGE | INTRAVENOUS | Status: DC | PRN
  Administered 2017-06-04 (×2): 120 ug via INTRAVENOUS

## 2017-06-04 MED ORDER — PHENYLEPHRINE HCL 10 MG/ML IJ SOLN
INTRAVENOUS | Status: DC | PRN
  Administered 2017-06-04: 20 ug/min via INTRAVENOUS

## 2017-06-04 MED ORDER — MIDAZOLAM HCL 5 MG/5ML IJ SOLN
INTRAMUSCULAR | Status: DC | PRN
  Administered 2017-06-04 (×2): 1 mg via INTRAVENOUS

## 2017-06-04 MED ORDER — DIPHENHYDRAMINE HCL 12.5 MG/5ML PO ELIX: 12.5000 mg | ORAL_SOLUTION | Freq: Four times a day (QID) | ORAL | Status: DC | PRN

## 2017-06-04 MED ORDER — DEXAMETHASONE SODIUM PHOSPHATE 10 MG/ML IJ SOLN
INTRAMUSCULAR | Status: DC | PRN
  Administered 2017-06-04: 10 mg via INTRAVENOUS

## 2017-06-04 MED ORDER — DIPHENHYDRAMINE HCL 50 MG/ML IJ SOLN: 12.5000 mg | Freq: Four times a day (QID) | INTRAMUSCULAR | Status: DC | PRN

## 2017-06-04 MED ORDER — EPHEDRINE 5 MG/ML INJ
INTRAVENOUS | Status: AC
  Filled 2017-06-04: qty 10

## 2017-06-04 MED ORDER — SODIUM CHLORIDE 0.9 % IV SOLN
INTRAVENOUS | Status: DC | PRN
  Administered 2017-06-04: 45 mL

## 2017-06-04 MED ORDER — BUPIVACAINE LIPOSOME 1.3 % IJ SUSP
20.0000 mL | INTRAMUSCULAR | Status: DC
  Filled 2017-06-04: qty 20

## 2017-06-04 MED ORDER — SUCCINYLCHOLINE CHLORIDE 200 MG/10ML IV SOSY
PREFILLED_SYRINGE | INTRAVENOUS | Status: AC
  Filled 2017-06-04: qty 10

## 2017-06-04 MED ORDER — SUGAMMADEX SODIUM 200 MG/2ML IV SOLN
INTRAVENOUS | Status: AC
  Filled 2017-06-04: qty 2

## 2017-06-04 MED ORDER — ONDANSETRON HCL 4 MG/2ML IJ SOLN
INTRAMUSCULAR | Status: AC
  Filled 2017-06-04: qty 2

## 2017-06-04 MED ORDER — DEXAMETHASONE SODIUM PHOSPHATE 10 MG/ML IJ SOLN
INTRAMUSCULAR | Status: AC
  Filled 2017-06-04: qty 1

## 2017-06-04 MED ORDER — CHLORHEXIDINE GLUCONATE CLOTH 2 % EX PADS
6.0000 | MEDICATED_PAD | Freq: Every day | CUTANEOUS | Status: DC
  Administered 2017-06-05 – 2017-06-14 (×4): 6 via TOPICAL

## 2017-06-04 MED ORDER — FENTANYL CITRATE (PF) 100 MCG/2ML IJ SOLN
INTRAMUSCULAR | Status: DC | PRN
  Administered 2017-06-04 (×2): 50 ug via INTRAVENOUS
  Administered 2017-06-04: 150 ug via INTRAVENOUS

## 2017-06-04 SURGICAL SUPPLY — 102 items
ADH SKN CLS APL DERMABOND .7 (GAUZE/BANDAGES/DRESSINGS) ×2
APPLIER CLIP 5 13 M/L LIGAMAX5 (MISCELLANEOUS) ×3
APR CLP MED LRG 5 ANG JAW (MISCELLANEOUS) ×2
BAG SPEC RTRVL LRG 6X4 10 (ENDOMECHANICALS)
CANISTER SUCT 3000ML PPV (MISCELLANEOUS) ×6 IMPLANT
CATH THORACIC 28FR (CATHETERS) ×1 IMPLANT
CATH THORACIC 28FR RT ANG (CATHETERS) IMPLANT
CATH THORACIC 36FR (CATHETERS) IMPLANT
CATH THORACIC 36FR RT ANG (CATHETERS) IMPLANT
CLIP APPLIE 5 13 M/L LIGAMAX5 (MISCELLANEOUS) IMPLANT
CLIP VESOCCLUDE MED 6/CT (CLIP) ×3 IMPLANT
CONN ST 1/4X3/8  BEN (MISCELLANEOUS)
CONN ST 1/4X3/8 BEN (MISCELLANEOUS) IMPLANT
CONN Y 3/8X3/8X3/8  BEN (MISCELLANEOUS)
CONN Y 3/8X3/8X3/8 BEN (MISCELLANEOUS) IMPLANT
CONT SPEC 4OZ CLIKSEAL STRL BL (MISCELLANEOUS) ×10 IMPLANT
COVER BACK TABLE 60X90IN (DRAPES) ×3 IMPLANT
DERMABOND ADVANCED (GAUZE/BANDAGES/DRESSINGS) ×1
DERMABOND ADVANCED .7 DNX12 (GAUZE/BANDAGES/DRESSINGS) IMPLANT
DRAIN CHANNEL 28F RND 3/8 FF (WOUND CARE) IMPLANT
DRAIN CHANNEL 32F RND 10.7 FF (WOUND CARE) IMPLANT
DRAPE LAPAROSCOPIC ABDOMINAL (DRAPES) ×3 IMPLANT
DRAPE WARM FLUID 44X44 (DRAPE) ×3 IMPLANT
ELECT BLADE 6.5 EXT (BLADE) ×3 IMPLANT
ELECT REM PT RETURN 9FT ADLT (ELECTROSURGICAL) ×3
ELECTRODE REM PT RTRN 9FT ADLT (ELECTROSURGICAL) ×2 IMPLANT
GAUZE SPONGE 4X4 12PLY STRL (GAUZE/BANDAGES/DRESSINGS) ×4 IMPLANT
GAUZE SPONGE 4X4 12PLY STRL LF (GAUZE/BANDAGES/DRESSINGS) ×1 IMPLANT
GLOVE SURG SIGNA 7.5 PF LTX (GLOVE) ×9 IMPLANT
GOWN STRL REUS W/ TWL LRG LVL3 (GOWN DISPOSABLE) ×4 IMPLANT
GOWN STRL REUS W/ TWL XL LVL3 (GOWN DISPOSABLE) ×4 IMPLANT
GOWN STRL REUS W/TWL LRG LVL3 (GOWN DISPOSABLE) ×6
GOWN STRL REUS W/TWL XL LVL3 (GOWN DISPOSABLE) ×6
HEMOSTAT SURGICEL 2X14 (HEMOSTASIS) ×1 IMPLANT
KIT BASIN OR (CUSTOM PROCEDURE TRAY) ×3 IMPLANT
KIT CLEAN ENDO COMPLIANCE (KITS) ×6 IMPLANT
KIT ROOM TURNOVER OR (KITS) ×6 IMPLANT
KIT SUCTION CATH 14FR (SUCTIONS) IMPLANT
MARKER SKIN DUAL TIP RULER LAB (MISCELLANEOUS) ×3 IMPLANT
NDL HYPO 25GX1X1/2 BEV (NEEDLE) IMPLANT
NDL SPNL 18GX3.5 QUINCKE PK (NEEDLE) IMPLANT
NEEDLE HYPO 25GX1X1/2 BEV (NEEDLE) ×3 IMPLANT
NEEDLE SPNL 18GX3.5 QUINCKE PK (NEEDLE) ×3 IMPLANT
NS IRRIG 1000ML POUR BTL (IV SOLUTION) ×12 IMPLANT
OIL SILICONE PENTAX (PARTS (SERVICE/REPAIRS)) ×3 IMPLANT
PACK CHEST (CUSTOM PROCEDURE TRAY) ×3 IMPLANT
PAD ARMBOARD 7.5X6 YLW CONV (MISCELLANEOUS) ×12 IMPLANT
POUCH ENDO CATCH II 15MM (MISCELLANEOUS) IMPLANT
POUCH SPECIMEN RETRIEVAL 10MM (ENDOMECHANICALS) IMPLANT
RELOAD STAPLE 35X2.5 WHT THIN (STAPLE) IMPLANT
RELOAD STAPLE 45 4.1 GRN THCK (STAPLE) IMPLANT
RELOAD STAPLE 45 GOLD REG/THCK (STAPLE) IMPLANT
SCISSORS ENDO CVD 5DCS (MISCELLANEOUS) IMPLANT
SEALANT PROGEL (MISCELLANEOUS) IMPLANT
SEALANT SURG COSEAL 4ML (VASCULAR PRODUCTS) IMPLANT
SEALANT SURG COSEAL 8ML (VASCULAR PRODUCTS) IMPLANT
SHEARS HARMONIC HDI 20CM (ELECTROSURGICAL) ×1 IMPLANT
SOLUTION ANTI FOG 6CC (MISCELLANEOUS) ×3 IMPLANT
SPECIMEN JAR MEDIUM (MISCELLANEOUS) IMPLANT
SPONGE INTESTINAL PEANUT (DISPOSABLE) ×2 IMPLANT
SPONGE TONSIL 1 RF SGL (DISPOSABLE) ×3 IMPLANT
STAPLE RELOAD 2.5MM WHITE (STAPLE) ×21 IMPLANT
STAPLE RELOAD 45 GRN (STAPLE) ×4 IMPLANT
STAPLE RELOAD 45MM GOLD (STAPLE) ×30 IMPLANT
STAPLE RELOAD 45MM GREEN (STAPLE) ×6
STAPLER ECHELON POWERED (MISCELLANEOUS) ×1 IMPLANT
STAPLER VASCULAR ECHELON 35 (CUTTER) ×1 IMPLANT
SUT PROLENE 4 0 RB 1 (SUTURE) ×6
SUT PROLENE 4-0 RB1 .5 CRCL 36 (SUTURE) IMPLANT
SUT SILK  1 MH (SUTURE) ×2
SUT SILK 1 MH (SUTURE) ×4 IMPLANT
SUT SILK 1 TIES 10X30 (SUTURE) ×3 IMPLANT
SUT SILK 2 0 SH (SUTURE) IMPLANT
SUT SILK 2 0SH CR/8 30 (SUTURE) IMPLANT
SUT SILK 3 0 SH 30 (SUTURE) IMPLANT
SUT SILK 3 0SH CR/8 30 (SUTURE) ×3 IMPLANT
SUT VIC AB 1 CTX 36 (SUTURE) ×3
SUT VIC AB 1 CTX36XBRD ANBCTR (SUTURE) ×2 IMPLANT
SUT VIC AB 2-0 CTX 27 (SUTURE) ×1 IMPLANT
SUT VIC AB 2-0 CTX 36 (SUTURE) ×3 IMPLANT
SUT VIC AB 3-0 MH 27 (SUTURE) IMPLANT
SUT VIC AB 3-0 X1 27 (SUTURE) ×4 IMPLANT
SUT VICRYL 0 UR6 27IN ABS (SUTURE) ×1 IMPLANT
SUT VICRYL 2 TP 1 (SUTURE) ×1 IMPLANT
SYR 10ML LL (SYRINGE) ×3 IMPLANT
SYR 20CC LL (SYRINGE) ×3 IMPLANT
SYR 20ML ECCENTRIC (SYRINGE) ×3 IMPLANT
SYR 30ML LL (SYRINGE) ×1 IMPLANT
SYR 5ML LL (SYRINGE) ×3 IMPLANT
SYR 5ML LUER SLIP (SYRINGE) ×3 IMPLANT
SYSTEM SAHARA CHEST DRAIN ATS (WOUND CARE) ×3 IMPLANT
TAPE CLOTH 4X10 WHT NS (GAUZE/BANDAGES/DRESSINGS) ×3 IMPLANT
TAPE CLOTH SURG 6X10 WHT LF (GAUZE/BANDAGES/DRESSINGS) ×1 IMPLANT
TIP APPLICATOR SPRAY EXTEND 16 (VASCULAR PRODUCTS) ×1 IMPLANT
TOWEL GREEN STERILE (TOWEL DISPOSABLE) ×6 IMPLANT
TOWEL GREEN STERILE FF (TOWEL DISPOSABLE) ×6 IMPLANT
TRAP SPECIMEN MUCOUS 40CC (MISCELLANEOUS) ×3 IMPLANT
TRAY FOLEY W/METER SILVER 16FR (SET/KITS/TRAYS/PACK) ×3 IMPLANT
TROCAR XCEL BLADELESS 5X75MML (TROCAR) ×3 IMPLANT
TUBE CONNECTING 20X1/4 (TUBING) ×3 IMPLANT
VALVE DISPOSABLE (MISCELLANEOUS) ×3 IMPLANT
WATER STERILE IRR 1000ML POUR (IV SOLUTION) ×6 IMPLANT

## 2017-06-04 NOTE — Anesthesia Postprocedure Evaluation (Signed)
Anesthesia Post Note  Patient: Karen West  Procedure(s) Performed: VIDEO ASSISTED THORACOSCOPY (VATS)/LEFT UPPER LOBECTOMY (Left Chest) VIDEO BRONCHOSCOPY WITH ENDOBRONCHIAL ULTRASOUND (N/A )     Patient location during evaluation: PACU Anesthesia Type: General Level of consciousness: awake and alert Pain management: pain level controlled Vital Signs Assessment: post-procedure vital signs reviewed and stable Respiratory status: spontaneous breathing, nonlabored ventilation, respiratory function stable and patient connected to nasal cannula oxygen Cardiovascular status: blood pressure returned to baseline and stable Postop Assessment: no apparent nausea or vomiting Anesthetic complications: no    Last Vitals:  Vitals:   06/04/17 1410 06/04/17 1500  BP:  (!) 108/59  Pulse:    Resp: (!) 25 (!) 36  Temp:    SpO2: 100% 100%    Last Pain:  Vitals:   06/04/17 1410  TempSrc:   PainSc: Asleep                 Dequandre Cordova DAVID

## 2017-06-04 NOTE — Op Note (Signed)
NAME:  Karen West, Karen West NO.:  [id number]  MEDICAL RECORD NO.:  [id number]  LOCATION:  [facility]                         FACILITY:  [facility]  PHYSICIAN:  Revonda Standard. Roxan Hockey, M.D.DATE OF BIRTH:  1941-02-18  DATE OF PROCEDURE:  06/04/2017 DATE OF DISCHARGE:                              OPERATIVE REPORT   PREOPERATIVE DIAGNOSIS:  Non-small cell carcinoma, left upper lobe, clinical stage IA (T1, N0).  POSTOPERATIVE DIAGNOSIS:  Non-small cell carcinoma, left upper lobe, clinical stage IA (T1, N0).  PROCEDURES: 1. Bronchoscopy, 2. Endobronchial ultrasound. 3. Left video-assisted thoracoscopy. 4. Left upper lobectomy. 5. Mediastinal lymph node dissection. 6. Intercostal nerve block.  SURGEON:  Revonda Standard. Roxan Hockey, MD.  ASSISTANT:  Nicholes Rough, PA.  ANESTHESIA:  General.  FINDINGS:  EBUS aspirations of 4R node showed lymphoid cells.  No tumor was seen.  Aspiration from level 7 was bloody.  Bleeding encountered during dissection of the lingular pulmonary arterial branches requiring conversion to thoracotomy.  Bronchial margin negative for tumor.  CLINICAL NOTE:  Karen West is a 77 year old woman who was first found to have a lung nodule back in 2010.  This was an incidental finding on a CT done for research study.  Over the next couple of years, the nodule remained stable and she then was lost to followup.  However, she presented back with shortness of breath in November and the nodule had increased in size.  It was hypermetabolic on PET-CT.  She had a thyroidectomy by Dr. Harlow Asa previously and at that time I did a navigational bronchoscopy on the lung nodule, which turned out to be a non-small cell carcinoma.  The options of surgery versus stereotactic radiation were discussed with the patient.  She wished to proceed with surgical resection.  The indications, risks, benefits, and alternatives were discussed in detail with the patient.  She understood and  accepted the risks and agreed to proceed.  OPERATIVE NOTE:  Karen West was brought to the preoperative holding area on June 04, 2017.  Anesthesia placed a central line and an arterial blood pressure monitoring line.  She was taken to the operating room, anesthetized, and intubated with a single-lumen endotracheal tube. Intravenous antibiotics were administered.  Sequential compression devices were placed on the calves for DVT prophylaxis.  A Foley catheter was placed.  After performing a time-out, flexible fiberoptic bronchoscopy was performed via the endotracheal tube. It revealed normal endobronchial anatomy with no endobronchial lesions to the level of subsegmental bronchi.  Endobronchial ultrasound then was performed.  A small 4R node, which correlated to the node that was hypermetabolic on PET-CT, was identified and aspirations were obtained and sent for quick prep.  A node in the subcarinal space also was noted and aspirations were performed of this node as well even though was not hypermetabolic on PET- CT.  Both of those samples came back showing no tumor seen.  There were lymphoid cells present on the 4R node.  Decision was made to proceed with left upper lobectomy as discussed with the patient preoperatively. She was reintubated with a double-lumen endotracheal tube, placed in a right lateral decubitus position and the left chest was prepped and draped in usual sterile fashion.  Single lung ventilation of the right lung was initiated and was tolerated well throughout the procedure.  An incision was made in the seventh interspace in the midaxillary line. A 5-mm port was inserted into the chest.  The thoracoscope was advanced into the chest.  There was good isolation of the left lung.  A 5-cm working incision was made in the fourth interspace anterolaterally.  The area for the incisions was infiltrated with liposomal bupivacaine and then each intercostal space was  infiltrated with liposomal bupivacaine from the 3rd to 9th interspace.  A total of 20 mL of liposomal bupivacaine was used.  It was diluted with 50 mL of saline.  The fissure was incomplete.  There were adhesions of the lower lobe to the diaphragm, which precluded dividing the inferior ligament.  The pleura was divided at the hilum anteriorly.  The fissure was relatively incomplete.  Some of the fissure was taken down with cautery.  The anterior portion of the fissure between the lingula and the lower lobe was completed with sequential firings of the Echelon 60-mm stapler using gold cartridges.  The superior pulmonary vein was identified.  It was dissected out and divided with the endoscopic vascular stapler.  The dissection was carried back to the fissure.  There was a node at the bifurcation of the lingula branch of the this node was dissected off there was bleeding from the lingular branch.  Pressure was held on this area to control the bleeding.  Attempts were made to clip the branch, but this resulted in further bleeding.  Pressure was again held.  The incision was extended short distance posteriorly and the lower rib was cut.  A retractor was placed.  This allowed ligation of the lingular branch at its takeoff with a 4-0 Prolene suture.  Dissection was carried along the main pulmonary artery and the remainder of the fissure was completed with sequential firings of the Echelon stapler.  Working back anteriorly and superiorly, the anterior and apical branches of the left upper lobe pulmonary artery were dissected out and divided with the endoscopic vascular stapler.  A second port incision made anterior to the first placement of the stapler to improve the angle.  The posterior branch then was dissected out and encircled and divided with the stapler.  The Echelon stapler with a green cartridge then was placed across the left upper lobe bronchus at its origin and closed.  A test inflation show good  aeration of the lower lobe.  The stapler was fired transecting the upper lobe bronchus.  The upper lobe was placed into an endoscopic retrieval bag removed and sent for frozen section of the bronchial margin.  That returned with no tumor seen.  The pleura overlying the aortopulmonary window was opened and level 5 nodes were removed.  Posteriorly the subcarinal nodes were removed as well.  The chest was copiously irrigated with warm saline.  A test inflation at 30 cm water revealed small amount of leakage from the dissection of the fissure.  Pro-gel was applied to this area.  A 28 French chest tube was placed through the anterior port incision and secured to the skin with a silk suture. Dual lung ventilation was resumed.  The ribs were reapproximated with #2 Vicryl pericostal sutures.  The fascia was closed with a #1 Vicryl suture the subcutaneous tissue and skin were closed in standard fashion.  The chest tube was placed to suction.  The patient was placed back in a supine position.  She  was extubated in the operating room and taken to the postanesthetic care unit in good condition.       Revonda Standard Roxan Hockey, M.D.     SCH/MEDQ  D:  06/04/2017  T:  06/04/2017  Job:  [id number]

## 2017-06-04 NOTE — Anesthesia Procedure Notes (Signed)
Procedure Name: Intubation Date/Time: 06/04/2017 8:39 AM Performed by: Lowella Dell, CRNA Pre-anesthesia Checklist: Patient identified, Emergency Drugs available, Suction available and Patient being monitored Patient Re-evaluated:Patient Re-evaluated prior to induction Oxygen Delivery Method: Circle System Utilized Preoxygenation: Pre-oxygenation with 100% oxygen Induction Type: Inhalational induction with existing ETT Laryngoscope Size: Mac and 3 Grade View: Grade I Tube type: Oral Endobronchial tube: EBT position confirmed by fiberoptic bronchoscope and Double lumen EBT and 37 Fr Number of attempts: 1 Airway Equipment and Method: Stylet and Oral airway Placement Confirmation: ETT inserted through vocal cords under direct vision,  positive ETCO2 and breath sounds checked- equal and bilateral Secured at: 39 cm Tube secured with: Tape Dental Injury: Teeth and Oropharynx as per pre-operative assessment  Comments: VivaSight DL utilized

## 2017-06-04 NOTE — OR Nursing (Signed)
On assessment of patient in Holding area prior to arrival in OR 10 it was noted that patient had noticeable pain on movement of left arm. I expressed concern about positioning of patient for VATS procedure and asked patient to describe pain and when it first occurred. Patient stated it had been several weeks and that she had seen a physician about the arm. I then asked patient to lay on her right side and we extended her arm into the approximate position it would be placed into for the procedure. While she expressed pain during movement, once the position was attained, she said there was no pain.

## 2017-06-04 NOTE — Brief Op Note (Addendum)
06/04/2017  12:18 PM  PATIENT:  Karen West  77 y.o. female  PRE-OPERATIVE DIAGNOSIS:  LUL ADENOCARCINOMA- Clinical stage IA(T1,N0)  POST-OPERATIVE DIAGNOSIS:  LUL ADENOCARCINOMA  PROCEDURE:  Procedure(s): VIDEO ASSISTED THORACOSCOPY (VATS)/LEFT UPPER LOBECTOMY (Left) VIDEO BRONCHOSCOPY WITH ENDOBRONCHIAL ULTRASOUND (N/A)  MEDIASTINAL LYMPH NODE DISSECTION INTERCOSTAL NERVE BLOCK  SURGEON:  Surgeon(s) and Role:    * [name], MD - Primary  PHYSICIAN ASSISTANT:  Nicholes Rough, PA-C   ANESTHESIA:   general  EBL:  700 mL   BLOOD ADMINISTERED:none  DRAINS: ONE STRIAGHT CHEST TUBE   LOCAL MEDICATIONS USED:  NONE  SPECIMEN:  Source of Specimen:  LUL nodule  DISPOSITION OF SPECIMEN:  PATHOLOGY  COUNTS:  YES  DICTATION: .Dragon Dictation  PLAN OF CARE: Admit to inpatient   PATIENT DISPOSITION:  ICU - intubated and hemodynamically stable.   Delay start of Pharmacological VTE agent (>24hrs) due to surgical blood loss or risk of bleeding: no  Findings: No tumor seen on aspirations from 4R and 7 nodes Bronchial margin negative for tumor Incomplete fissure- converted to thoracotomy due top bleeding from lingular artery

## 2017-06-04 NOTE — Anesthesia Procedure Notes (Signed)
Central Venous Catheter Insertion Performed by: Oleta Mouse, MD, anesthesiologist Start/End3/18/2019 7:23 AM, 06/04/2017 7:26 AM Patient location: Pre-op. Preanesthetic checklist: patient identified, IV checked, site marked, risks and benefits discussed, surgical consent, monitors and equipment checked, pre-op evaluation, timeout performed and anesthesia consent Position: Trendelenburg Lidocaine 1% used for infiltration Hand hygiene performed  and maximum sterile barriers used  Catheter size: 8 Fr Total catheter length 16. Central line was placed.Double lumen Procedure performed using ultrasound guided technique. Ultrasound Notes:image(s) printed for medical record Attempts: 1 Following insertion, dressing applied, line sutured and Biopatch. Post procedure assessment: blood return through all ports  Patient tolerated the procedure well with no immediate complications.

## 2017-06-04 NOTE — Anesthesia Procedure Notes (Signed)
Arterial Line Insertion Start/End3/18/2019 7:20 AM, 06/04/2017 7:25 AM Performed by: Lowella Dell, CRNA, CRNA  Patient location: Pre-op. Preanesthetic checklist: patient identified, IV checked, site marked, risks and benefits discussed, surgical consent, monitors and equipment checked, pre-op evaluation and timeout performed Lidocaine 1% used for infiltration and patient sedated Left, radial was placed Catheter size: 20 G Hand hygiene performed  and maximum sterile barriers used  Allen's test indicative of satisfactory collateral circulation Attempts: 1 Procedure performed without using ultrasound guided technique. Following insertion, dressing applied and Biopatch. Post procedure assessment: unchanged  Patient tolerated the procedure well with no immediate complications.

## 2017-06-04 NOTE — Progress Notes (Signed)
35cc of fentanyl wasted with Paulina Fusi

## 2017-06-04 NOTE — Transfer of Care (Signed)
Immediate Anesthesia Transfer of Care Note  Patient: SHALEAH NISSLEY  Procedure(s) Performed: VIDEO ASSISTED THORACOSCOPY (VATS)/LEFT UPPER LOBECTOMY (Left Chest) VIDEO BRONCHOSCOPY WITH ENDOBRONCHIAL ULTRASOUND (N/A )  Patient Location: PACU  Anesthesia Type:General  Level of Consciousness: awake, alert , oriented and patient cooperative  Airway & Oxygen Therapy: Patient Spontanous Breathing and Patient connected to face mask oxygen  Post-op Assessment: Report given to RN, Post -op Vital signs reviewed and stable and Patient moving all extremities  Post vital signs: Reviewed and stable  Last Vitals:  Vitals:   06/04/17 1247 06/04/17 1248  BP:    Pulse: 73 72  Resp: 16 15  Temp:    SpO2: 100% 100%    Last Pain:  Vitals:   06/04/17 0608  TempSrc:   PainSc: 8       Patients Stated Pain Goal: 3 (87/19/59 7471)  Complications: No apparent anesthesia complications

## 2017-06-04 NOTE — Progress Notes (Signed)
[facility] BRIEF SICU PROGRESS NOTE  Day of Surgery  S/P Procedure(s) (LRB): VIDEO ASSISTED THORACOSCOPY (VATS)/LEFT UPPER LOBECTOMY (Left) VIDEO BRONCHOSCOPY WITH ENDOBRONCHIAL ULTRASOUND (N/A)   Stable early postop Breathing comfortably w/ O2 sats 100% Adequate analgesia Chest tube output low, no air leak UOP adequate  Plan: Continue routine early postop  Rexene Alberts, MD 06/04/2017 5:56 PM

## 2017-06-04 NOTE — Interval H&P Note (Signed)
History and Physical Interval Note:  06/04/2017 7:20 AM  Karen West  has presented today for surgery, with the diagnosis of LUL ADENOCARCINOMA  The various methods of treatment have been discussed with the patient and family. After consideration of risks, benefits and other options for treatment, the patient has consented to  Procedure(s): VIDEO ASSISTED THORACOSCOPY (VATS)/LEFT UPPER LOBECTOMY (Left) VIDEO BRONCHOSCOPY WITH ENDOBRONCHIAL ULTRASOUND (N/A) as a surgical intervention .  The patient's history has been reviewed, patient examined, no change in status, stable for surgery.  I have reviewed the patient's chart and labs.  Questions were answered to the patient's satisfaction.     [name]

## 2017-06-04 NOTE — Anesthesia Procedure Notes (Signed)
Procedure Name: Intubation Date/Time: 06/04/2017 7:48 AM Performed by: Lowella Dell, CRNA Pre-anesthesia Checklist: Patient identified, Emergency Drugs available, Suction available and Patient being monitored Patient Re-evaluated:Patient Re-evaluated prior to induction Oxygen Delivery Method: Circle System Utilized Preoxygenation: Pre-oxygenation with 100% oxygen Induction Type: IV induction Ventilation: Mask ventilation without difficulty and Oral airway inserted - appropriate to patient size Laryngoscope Size: Mac and 3 Grade View: Grade I Tube type: Oral Tube size: 8.5 mm Number of attempts: 1 Airway Equipment and Method: Stylet Placement Confirmation: ETT inserted through vocal cords under direct vision,  positive ETCO2 and breath sounds checked- equal and bilateral Secured at: 22 cm Tube secured with: Tape Dental Injury: Teeth and Oropharynx as per pre-operative assessment

## 2017-06-04 NOTE — Anesthesia Preprocedure Evaluation (Addendum)
Anesthesia Evaluation  Patient identified by MRN, date of birth, ID band Patient awake    Reviewed: Allergy & Precautions, NPO status , Patient's Chart, lab work & pertinent test results  Airway Mallampati: I  TM Distance: >3 FB Neck ROM: Full    Dental  (+) Upper Dentures, Poor Dentition, Dental Advisory Given   Pulmonary    Pulmonary exam normal        Cardiovascular hypertension, Pt. on medications Normal cardiovascular exam     Neuro/Psych    GI/Hepatic   Endo/Other  diabetes, Type 2, Insulin Dependent  Renal/GU      Musculoskeletal   Abdominal   Peds  Hematology   Anesthesia Other Findings   Reproductive/Obstetrics                            Anesthesia Physical Anesthesia Plan  ASA: III  Anesthesia Plan: General   Post-op Pain Management:    Induction: Intravenous  PONV Risk Score and Plan: 3 and Ondansetron and Treatment may vary due to age or medical condition  Airway Management Planned: Double Lumen EBT  Additional Equipment: Arterial line and CVP  Intra-op Plan:   Post-operative Plan: Extubation in OR  Informed Consent: I have reviewed the patients History and Physical, chart, labs and discussed the procedure including the risks, benefits and alternatives for the proposed anesthesia with the patient or authorized representative who has indicated his/her understanding and acceptance.     Plan Discussed with: CRNA and Surgeon  Anesthesia Plan Comments:         Anesthesia Quick Evaluation

## 2017-06-05 ENCOUNTER — Inpatient Hospital Stay (HOSPITAL_COMMUNITY): Payer: Medicare Other

## 2017-06-05 ENCOUNTER — Encounter (HOSPITAL_COMMUNITY): Payer: Self-pay | Admitting: Thoracic Surgery (Cardiothoracic Vascular Surgery)

## 2017-06-05 LAB — CBC
HCT: 23.4 % — ABNORMAL LOW (ref 36.0–46.0)
Hemoglobin: 7.8 g/dL — ABNORMAL LOW (ref 12.0–15.0)
MCH: 28 pg (ref 26.0–34.0)
MCHC: 33.3 g/dL (ref 30.0–36.0)
MCV: 83.9 fL (ref 78.0–100.0)
Platelets: 218 10*3/uL (ref 150–400)
RBC: 2.79 MIL/uL — ABNORMAL LOW (ref 3.87–5.11)
RDW: 14.2 % (ref 11.5–15.5)
WBC: 7.8 10*3/uL (ref 4.0–10.5)

## 2017-06-05 LAB — BASIC METABOLIC PANEL
Anion gap: 16 — ABNORMAL HIGH (ref 5–15)
BUN: 24 mg/dL — ABNORMAL HIGH (ref 6–20)
CO2: 23 mmol/L (ref 22–32)
Calcium: 7.4 mg/dL — ABNORMAL LOW (ref 8.9–10.3)
Chloride: 101 mmol/L (ref 101–111)
Creatinine, Ser: 1.02 mg/dL — ABNORMAL HIGH (ref 0.44–1.00)
GFR calc Af Amer: >60 mL/min (ref >60)
GFR calc non Af Amer: 53 mL/min — ABNORMAL LOW (ref >60)
Glucose, Bld: 162 mg/dL — ABNORMAL HIGH (ref 65–99)
Potassium: 3.3 mmol/L — ABNORMAL LOW (ref 3.5–5.1)
Sodium: 140 mmol/L (ref 135–145)

## 2017-06-05 LAB — BLOOD GAS, ARTERIAL
Acid-Base Excess: 2.5 mmol/L — ABNORMAL HIGH (ref 0.0–2.0)
Bicarbonate: 26.2 mmol/L (ref 20.0–28.0)
O2 Content: 1 L/min
O2 Saturation: 95.1 %
Patient temperature: 98.1
pCO2 arterial: 37.2 mmHg (ref 32.0–48.0)
pH, Arterial: 7.46 — ABNORMAL HIGH (ref 7.350–7.450)
pO2, Arterial: 88.1 mmHg (ref 83.0–108.0)

## 2017-06-05 LAB — GLUCOSE, CAPILLARY
Glucose-Capillary: 167 mg/dL — ABNORMAL HIGH (ref 65–99)
Glucose-Capillary: 77 mg/dL (ref 65–99)
Glucose-Capillary: 158 mg/dL — ABNORMAL HIGH (ref 65–99)
Glucose-Capillary: 217 mg/dL — ABNORMAL HIGH (ref 65–99)
Glucose-Capillary: 220 mg/dL — ABNORMAL HIGH (ref 65–99)

## 2017-06-05 MED ORDER — ASPIRIN EC 81 MG PO TBEC
81.0000 mg | DELAYED_RELEASE_TABLET | Freq: Every day | ORAL | Status: DC
  Administered 2017-06-05 – 2017-06-15 (×11): 81 mg via ORAL
  Filled 2017-06-05 (×11): qty 1

## 2017-06-05 MED ORDER — METFORMIN HCL 500 MG PO TABS
1000.0000 mg | ORAL_TABLET | Freq: Two times a day (BID) | ORAL | Status: DC
  Administered 2017-06-05 – 2017-06-15 (×20): 1000 mg via ORAL
  Filled 2017-06-05 (×21): qty 2

## 2017-06-05 MED ORDER — TRIAMTERENE-HCTZ 37.5-25 MG PO TABS
0.5000 | ORAL_TABLET | Freq: Every day | ORAL | Status: DC
  Administered 2017-06-05 – 2017-06-13 (×9): 0.5 via ORAL
  Filled 2017-06-05 (×10): qty 0.5

## 2017-06-05 MED ORDER — AMLODIPINE BESYLATE 10 MG PO TABS
10.0000 mg | ORAL_TABLET | Freq: Every day | ORAL | Status: DC
  Administered 2017-06-05 – 2017-06-15 (×11): 10 mg via ORAL
  Filled 2017-06-05 (×11): qty 1

## 2017-06-05 MED ORDER — POTASSIUM CHLORIDE 10 MEQ/50ML IV SOLN
10.0000 meq | INTRAVENOUS | Status: AC
  Administered 2017-06-05 (×3): 10 meq via INTRAVENOUS
  Filled 2017-06-05 (×3): qty 50

## 2017-06-05 MED ORDER — CAPSAICIN 0.025 % EX CREA: 1.0000 "application " | TOPICAL_CREAM | Freq: Every day | CUTANEOUS | Status: DC | PRN

## 2017-06-05 MED ORDER — ONDANSETRON HCL 4 MG/2ML IJ SOLN
INTRAMUSCULAR | Status: AC
  Administered 2017-06-05: 4 mg
  Filled 2017-06-05: qty 2

## 2017-06-05 MED ORDER — POTASSIUM CHLORIDE 10 MEQ/50ML IV SOLN
10.0000 meq | INTRAVENOUS | Status: AC
  Administered 2017-06-05 (×2): 10 meq via INTRAVENOUS
  Filled 2017-06-05 (×2): qty 50

## 2017-06-05 MED ORDER — ONDANSETRON HCL 4 MG/2ML IJ SOLN: 4.0000 mg | Freq: Four times a day (QID) | INTRAMUSCULAR | Status: DC | PRN

## 2017-06-05 MED ORDER — GLIPIZIDE ER 10 MG PO TB24
10.0000 mg | ORAL_TABLET | Freq: Two times a day (BID) | ORAL | Status: DC
  Administered 2017-06-05 – 2017-06-15 (×21): 10 mg via ORAL
  Filled 2017-06-05 (×24): qty 1

## 2017-06-05 MED ORDER — INSULIN ASPART 100 UNIT/ML ~~LOC~~ SOLN
0.0000 [IU] | Freq: Three times a day (TID) | SUBCUTANEOUS | Status: DC
  Administered 2017-06-05 – 2017-06-06 (×3): 3 [IU] via SUBCUTANEOUS
  Administered 2017-06-06: 1 [IU] via SUBCUTANEOUS
  Administered 2017-06-07: 3 [IU] via SUBCUTANEOUS
  Administered 2017-06-07 (×2): 2 [IU] via SUBCUTANEOUS
  Administered 2017-06-08: 3 [IU] via SUBCUTANEOUS
  Administered 2017-06-08: 2 [IU] via SUBCUTANEOUS
  Administered 2017-06-08 – 2017-06-09 (×2): 3 [IU] via SUBCUTANEOUS
  Administered 2017-06-10: 5 [IU] via SUBCUTANEOUS
  Administered 2017-06-10 – 2017-06-12 (×4): 3 [IU] via SUBCUTANEOUS
  Administered 2017-06-13: 8 [IU] via SUBCUTANEOUS
  Administered 2017-06-14: 3 [IU] via SUBCUTANEOUS
  Administered 2017-06-15: 5 [IU] via SUBCUTANEOUS

## 2017-06-05 MED ORDER — INSULIN ASPART 100 UNIT/ML ~~LOC~~ SOLN
0.0000 [IU] | Freq: Every day | SUBCUTANEOUS | Status: DC
  Administered 2017-06-05: 2 [IU] via SUBCUTANEOUS

## 2017-06-05 NOTE — Progress Notes (Signed)
Called report to 2 C.  Lucius Conn, RN

## 2017-06-05 NOTE — Progress Notes (Signed)
1 Day Post-Op Procedure(s) (LRB): VIDEO ASSISTED THORACOSCOPY (VATS)/LEFT UPPER LOBECTOMY (Left) VIDEO BRONCHOSCOPY WITH ENDOBRONCHIAL ULTRASOUND (N/A) Subjective: Some incisional pain  Objective: Vital signs in last 24 hours: Temp:  [97.3 F (36.3 C)-97.9 F (36.6 C)] 97.9 F (36.6 C) (03/19 0800) Pulse Rate:  [71-78] 78 (03/18 1349) Cardiac Rhythm: Normal sinus rhythm;Bundle branch block (03/18 2000) Resp:  [10-39] 16 (03/19 0800) BP: (108-159)/(48-86) 141/71 (03/19 0800) SpO2:  [93 %-100 %] 100 % (03/19 0800) Arterial Line BP: (103-169)/(43-81) 140/60 (03/19 0800) Weight:  [153 lb 9.6 oz (69.7 kg)] 153 lb 9.6 oz (69.7 kg) (03/19 0600)  Hemodynamic parameters for last 24 hours:    Intake/Output from previous day: 03/18 0701 - 03/19 0700 In: 4498.3 [P.O.:1200; I.V.:2748.3; IV Piggyback:550] Out: 3214 [Urine:2260; Blood:700; Chest Tube:254] Intake/Output this shift: Total I/O In: 225 [P.O.:25; I.V.:200] Out: -   General appearance: alert, cooperative and no distress Neurologic: intact Heart: regular rate and rhythm Lungs: wheezes faint bilateral Abdomen: normal findings: soft, non-tender and symmetric small air leak  Lab Results: Recent Labs    06/05/17 0402  WBC 7.8  HGB 7.8*  HCT 23.4*  PLT 218   BMET:  Recent Labs    06/05/17 0402  NA 140  K 3.3*  CL 101  CO2 23  GLUCOSE 162*  BUN 24*  CREATININE 1.02*  CALCIUM 7.4*    PT/INR: No results for input(s): LABPROT, INR in the last 72 hours. ABG    Component Value Date/Time   PHART 7.460 (H) 06/05/2017 0510   HCO3 26.2 06/05/2017 0510   O2SAT 95.1 06/05/2017 0510   CBG (last 3)  Recent Labs    06/04/17 1929 06/04/17 2337 06/05/17 0346  GLUCAP 280* 251* 167*    Assessment/Plan: S/P Procedure(s) (LRB): VIDEO ASSISTED THORACOSCOPY (VATS)/LEFT UPPER LOBECTOMY (Left) VIDEO BRONCHOSCOPY WITH ENDOBRONCHIAL ULTRASOUND (N/A) Plan for transfer to step-down: see transfer orders  Doing well POD #  1  CV- stable, restart antihypertensives  RESP- IS, nebs as needed  Small air leak- keep CT to suction today  RENAL- creatinine normal, hypokalemia- suppplement  ENDO- CBG elevated with D5 1/2- decrease to KVO- restart metformin and glipizide  Anemia secondary to ABL- follow  SCD + enoxaparin for DVT prophylaxis  Mobilize   LOS: 1 day    [name] 06/05/2017

## 2017-06-05 NOTE — Progress Notes (Signed)
Patient "woozy". Pt complaining of nausea and want ing to throw up. Jadene Pierini PA paged, verbal order for Zofran given. Pt refusing Zofran now despite education. Pt threw up 500 ml undigested food and drink. Educated patient on Zofran again, pt now allowing [place] to give it. Will continue to monitor closely.  Lucius Conn, RN

## 2017-06-05 NOTE — Addendum Note (Signed)
Addendum  created 06/05/17 1359 by Lillia Abed, MD   Intraprocedure Staff edited

## 2017-06-06 ENCOUNTER — Inpatient Hospital Stay (HOSPITAL_COMMUNITY): Payer: Medicare Other

## 2017-06-06 LAB — GLUCOSE, CAPILLARY
Glucose-Capillary: 154 mg/dL — ABNORMAL HIGH (ref 65–99)
Glucose-Capillary: 134 mg/dL — ABNORMAL HIGH (ref 65–99)
Glucose-Capillary: 167 mg/dL — ABNORMAL HIGH (ref 65–99)
Glucose-Capillary: 145 mg/dL — ABNORMAL HIGH (ref 65–99)

## 2017-06-06 LAB — CBC
HCT: 23.1 % — ABNORMAL LOW (ref 36.0–46.0)
Hemoglobin: 7.5 g/dL — ABNORMAL LOW (ref 12.0–15.0)
MCH: 28 pg (ref 26.0–34.0)
MCHC: 32.5 g/dL (ref 30.0–36.0)
MCV: 86.2 fL (ref 78.0–100.0)
Platelets: 200 10*3/uL (ref 150–400)
RBC: 2.68 MIL/uL — ABNORMAL LOW (ref 3.87–5.11)
RDW: 15 % (ref 11.5–15.5)
WBC: 8.8 10*3/uL (ref 4.0–10.5)

## 2017-06-06 LAB — COMPREHENSIVE METABOLIC PANEL
ALT: 10 U/L — ABNORMAL LOW (ref 14–54)
AST: 21 U/L (ref 15–41)
Albumin: 3.1 g/dL — ABNORMAL LOW (ref 3.5–5.0)
Alkaline Phosphatase: 42 U/L (ref 38–126)
Anion gap: 10 (ref 5–15)
BUN: 20 mg/dL (ref 6–20)
CO2: 24 mmol/L (ref 22–32)
Calcium: 6.6 mg/dL — ABNORMAL LOW (ref 8.9–10.3)
Chloride: 103 mmol/L (ref 101–111)
Creatinine, Ser: 1.05 mg/dL — ABNORMAL HIGH (ref 0.44–1.00)
GFR calc Af Amer: 59 mL/min — ABNORMAL LOW (ref >60)
GFR calc non Af Amer: 51 mL/min — ABNORMAL LOW (ref >60)
Glucose, Bld: 119 mg/dL — ABNORMAL HIGH (ref 65–99)
Potassium: 3.7 mmol/L (ref 3.5–5.1)
Sodium: 137 mmol/L (ref 135–145)
Total Bilirubin: 0.5 mg/dL (ref 0.3–1.2)
Total Protein: 5.7 g/dL — ABNORMAL LOW (ref 6.5–8.1)

## 2017-06-06 MED ORDER — GUAIFENESIN ER 600 MG PO TB12
1200.0000 mg | ORAL_TABLET | Freq: Two times a day (BID) | ORAL | Status: AC
  Administered 2017-06-06 – 2017-06-10 (×9): 1200 mg via ORAL
  Filled 2017-06-06 (×11): qty 2

## 2017-06-06 MED ORDER — ALBUTEROL SULFATE (2.5 MG/3ML) 0.083% IN NEBU
2.5000 mg | INHALATION_SOLUTION | Freq: Four times a day (QID) | RESPIRATORY_TRACT | Status: DC | PRN
Start: 2017-06-06 — End: 2017-06-15
  Administered 2017-06-06 – 2017-06-12 (×4): 2.5 mg via RESPIRATORY_TRACT
  Filled 2017-06-06 (×4): qty 3

## 2017-06-06 MED ORDER — IBUPROFEN 200 MG PO TABS: 400.0000 mg | ORAL_TABLET | Freq: Four times a day (QID) | ORAL | Status: DC | PRN

## 2017-06-06 MED ORDER — POTASSIUM CHLORIDE CRYS ER 20 MEQ PO TBCR
20.0000 meq | EXTENDED_RELEASE_TABLET | Freq: Two times a day (BID) | ORAL | Status: AC
  Administered 2017-06-06 (×2): 20 meq via ORAL
  Filled 2017-06-06 (×2): qty 1

## 2017-06-06 NOTE — Progress Notes (Signed)
2 Days Post-Op Procedure(s) (LRB): VIDEO ASSISTED THORACOSCOPY (VATS)/LEFT UPPER LOBECTOMY (Left) VIDEO BRONCHOSCOPY WITH ENDOBRONCHIAL ULTRASOUND (N/A) Subjective: Some soreness  Objective: Vital signs in last 24 hours: Temp:  [97.5 F (36.4 C)-98.2 F (36.8 C)] 98.2 F (36.8 C) (03/20 0801) Pulse Rate:  [79-93] 93 (03/20 0716) Cardiac Rhythm: Normal sinus rhythm (03/20 0700) Resp:  [15-23] 16 (03/20 0716) BP: (114-161)/(49-77) 114/54 (03/20 0716) SpO2:  [96 %-100 %] 96 % (03/20 0716) Arterial Line BP: (121)/(52) 121/52 (03/19 1000)  Hemodynamic parameters for last 24 hours:    Intake/Output from previous day: 03/19 0701 - 03/20 0700 In: 1262 [P.O.:612; I.V.:600; IV Piggyback:50] Out: 2245 [OZHYQ:6578; Emesis/NG output:500; Chest Tube:170] Intake/Output this shift: No intake/output data recorded.  General appearance: alert, cooperative and no distress Neurologic: intact Heart: regular rate and rhythm Lungs: wheezes bilaterally Abdomen: normal findings: soft, non-tender minimal air leak  Lab Results: Recent Labs    06/05/17 0402 06/06/17 0525  WBC 7.8 8.8  HGB 7.8* 7.5*  HCT 23.4* 23.1*  PLT 218 200   BMET:  Recent Labs    06/05/17 0402 06/06/17 0525  NA 140 137  K 3.3* 3.7  CL 101 103  CO2 23 24  GLUCOSE 162* 119*  BUN 24* 20  CREATININE 1.02* 1.05*  CALCIUM 7.4* 6.6*    PT/INR: No results for input(s): LABPROT, INR in the last 72 hours. ABG    Component Value Date/Time   PHART 7.460 (H) 06/05/2017 0510   HCO3 26.2 06/05/2017 0510   O2SAT 95.1 06/05/2017 0510   CBG (last 3)  Recent Labs    06/05/17 1729 06/05/17 2120 06/06/17 0800  GLUCAP 217* 220* 154*    Assessment/Plan: S/P Procedure(s) (LRB): VIDEO ASSISTED THORACOSCOPY (VATS)/LEFT UPPER LOBECTOMY (Left) VIDEO BRONCHOSCOPY WITH ENDOBRONCHIAL ULTRASOUND (N/A) -Overall doing well Pain well controlled with PO meds Minimal air leak- small apical space on CXR- will try on water seal  today Some wheezing this AM- will give albuterol Some difficulty clearing secretions- mucinex + flutter valve SCD + enoxaparin for DVT prophylaxis Ambulate Hypokalemia improved- will give PO KCl today    LOS: 2 days    Karen West 06/06/2017

## 2017-06-06 NOTE — Discharge Instructions (Signed)
Video-Assisted Thoracic Surgery, Care After ° °This sheet gives you information about how to care for yourself after your procedure. Your health care provider may also give you more specific instructions. If you have problems or questions, contact your health care provider. °What can I expect after the procedure? °After the procedure, it is common to have: °· Some pain and soreness in your chest. °· Pain when breathing in (inhaling) and coughing. °· Constipation. °· Fatigue. °· Difficulty sleeping. ° °Follow these instructions at home: °Preventing pneumonia °· Take deep breaths or do breathing exercises as instructed by your health care provider. Doing this helps prevent lung infection (pneumonia). °· Cough frequently. Coughing may cause discomfort, but it is important to clear mucus (phlegm) and expand your lungs. If it hurts to cough, hold a pillow against your chest or place the palms of both hands on top of the incision (use splinting) when you cough. This may help relieve discomfort. °· If you were given an incentive spirometer, use it as directed. An incentive spirometer is a tool that measures how well you are filling your lungs with each breath. °· Participate in pulmonary rehabilitation as directed by your health care provider. This is a program that combines education, exercise, and support from a team of specialists. The goal is to help you heal and get back to your normal activities as soon as possible. °Medicines °· Take over-the-counter or prescription medicines only as told by your health care provider. °· If you have pain, take pain-relieving medicine before your pain becomes severe. This is important because if your pain is under control, you will be able to breathe and cough more comfortably. °· If you were prescribed an antibiotic medicine, take it as told by your health care provider. Do not stop taking the antibiotic even if you start to feel better. °Activity °· Ask your health care provider  what activities are safe for you. °· Avoid activities that use your chest muscles for at least 3-4 weeks. °· Do not lift anything that is heavier than 10 lb (4.5 kg), or the limit that your health care provider tells you, until he or she says that it is safe. °Incision care °· Follow instructions from your health care provider about how to take care of your incision(s). Make sure you: °? Wash your hands with soap and water before you change your bandage (dressing). If soap and water are not available, use hand sanitizer. °? Change your dressing as told by your health care provider. °? Leave stitches (sutures), skin glue, or adhesive strips in place. These skin closures may need to stay in place for 2 weeks or longer. If adhesive strip edges start to loosen and curl up, you may trim the loose edges. Do not remove adhesive strips completely unless your health care provider tells you to do that. °· Keep your dressing dry until it has been removed. °· Check your incision area every day for signs of infection. Check for: °? Redness, swelling, or pain. °? Fluid or blood. °? Warmth. °? Pus or a bad smell. °Bathing °· Do not take baths, swim, or use a hot tub until your health care provider approves. You may take showers. °· After your dressing has been removed, use soap and water to gently wash your incision area. Do not use anything else to clean your incision(s) unless your health care provider tells you to do this. °Driving °· Do not drive until your health care provider approves. °· Do not drive   or use heavy machinery while taking prescription pain medicine. °Eating and drinking °· Eat a healthy, balanced diet as instructed by your health care provider. A healthy diet includes plenty of fresh fruits and vegetables, whole grains, and low-fat (lean) proteins. °· Limit foods that are high in fat and processed sugars, such as fried and sweet foods. °· Drink enough fluid to keep your urine clear or pale yellow. °General  instructions °· To prevent or treat constipation while you are taking prescription pain medicine, your health care provider may recommend that you: °? Take over-the-counter or prescription medicines. °? Eat foods that are high in fiber, such as beans, fresh fruits and vegetables, and whole grains. °· Do not use any products that contain nicotine or tobacco, such as cigarettes and e-cigarettes. If you need help quitting, ask your health care provider. °· Avoid secondhand smoke. °· Wear compression stockings as told by your health care provider. These stockings help to prevent blood clots and reduce swelling in your legs. °· If you have a chest tube, care for it as instructed by your health care provider. Do not travel by airplane during the 2 weeks after your chest tube is removed, or until your health care provider says that this is safe. °· Keep all follow-up visits as told by your health care provider. This is important. °Contact a health care provider if: °· You have redness, swelling, or pain around an incision. °· You have fluid or blood coming from an incision. °· Your incision area feels warm to the touch. °· You have pus or a bad smell coming from an incision. °· You have a fever or chills. °· You have nausea or vomiting. °· You have pain that does not get better with medicine. °Get help right away if: °· You have chest pain. °· Your heart is fluttering or beating rapidly. °· You develop a rash. °· You have shortness of breath or trouble breathing. °· You are confused. °· You have trouble speaking. °· You feel weak, light-headed, or dizzy. °· You faint. °Summary °· To help prevent lung infection (pneumonia), take deep breaths or do breathing exercises as instructed by your health care provider. °· Cough frequently to clear mucus (phlegm) and expand your lungs. If it hurts to cough, hold a pillow against your chest or place the palms of both hands on top of the incision (use splinting) when you cough. °· If  you have pain, take pain-relieving medicine before your pain becomes severe. This is important because if your pain is under control, you will be able to breathe and cough more comfortably. °· Ask your health care provider what activities are safe for you. °This information is not intended to replace advice given to you by your health care provider. Make sure you discuss any questions you have with your health care provider. °Document Released: 07/01/2012 Document Revised: 02/14/2016 Document Reviewed: 02/14/2016 °[employer] Interactive Patient Education © 2017 [employer] ° °

## 2017-06-06 NOTE — Discharge Summary (Addendum)
[address]       [place] [address]             [phone]      Physician Discharge Summary  Patient ID: Karen West MRN: [id number] DOB/AGE: 1940/11/20 77 y.o.  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  She is a lifelong non-smoker but her husband did smoke.  She was first found to have a lung nodule in 2010.  That was an incidental finding on a CT done for research study.  Over the next couple of years the nodule remained stable and she was lost to follow-up.  She saw Dr. Chase Caller in November complained of shortness of breath.  CT of the chest showed a left upper lobe nodule increased in size from 2012.  The nodule was hypermetabolic on PET.  He also had a hypermetabolic thyroid nodule.  Dr. Harlow Asa did a thyroidectomy on her last week.  I did a navigational bronchoscopy at the same setting..  She did well postoperatively and went home the following day.  The thyroid turned out to be multinodular hyperplasia.  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  She feels well.  She has minimal pain from the incision.  Hospital Course: On June 04, 2017 Ms. Band underwent a bronchoscopy, endobronchial ultrasound, left video-assisted thoracoscopy, left upper lobectomy, mediastinal lymph node dissection, and intercostal nerve block with Dr. Erasmo Leventhal.  She tolerated the procedure well and was transferred to the ICU.  She was extubated in a timely manner.  Postop day 1 she was having some incisional pain.  We restarted her antihypertensives for better blood pressure control.  We continued her chest tube to suction today due to a small air leak.  Her kidney function remains normal.  We restarted her metformin and glipizide for blood glucose control.  We initiated Lovenox for DVT prophylaxis.  Postop day 2 she is overall doing very well.  She did have a minimal air leak and a minimal apical pneumothorax.  We switched the chest tube to suction today.  We ordered albuterol for some wheezing heard on exam.  We added Mucinex for some difficulty clearing secretions.  We encouraged both incentive spirometry and flutter valve use.  We supplemented potassium for hypokalemia.  We encouraged ambulation around the unit.We continued her chest tubes due to a small air leak. We placed her tube on water seal. POD 6 she had a 1+ airleak and we continued the chest tube. We continued her bronchodilators for wheezing. We discontinued her central line. She remained in NSR with a rate in the 90s. Overall she was feeling better and progressing at a slow and steady pace. We discontinued her chest tube on 3/27. She did have a small residual pneumothorax on 3/28. We kept the patient for observation. This morning her CXR is stable and she is cleared for discharge home.   Consults: None  Significant Diagnostic  Studies:  CLINICAL DATA:  Status post left upper lobectomy 2 days ago.  EXAM: PORTABLE CHEST 1 VIEW  COMPARISON:  Portable chest x-ray of June 05, 2017  FINDINGS: The lungs are slightly better inflated today. There is no pneumothorax on the right. On the left a tiny apical pneumothorax is suspected. The left chest tube is stable projecting over the posterior 6-seventh rib interspace. The left hemidiaphragm remains mildly elevated. There is subsegmental  atelectasis at the right lung base. The heart is top-normal in size. There is dense calcification in the mitral valvular annulus and aortic arch. The right internal jugular venous catheter tip projects over the distal third of the SVC.  IMPRESSION: Slightly improved aeration of both lungs. 5-10% left apical pneumothorax. Stable positioning of the left chest tube. Subsegmental atelectasis at the right lung base.  Thoracic aortic atherosclerosis.   Electronically Signed   By: David  Martinique M.D.   On: 06/06/2017 09:05   Treatments:  NAME:  Karen, West              ACCOUNT NO.:  [id number]  MEDICAL RECORD NO.:  [id number]  LOCATION:  [facility]                         FACILITY:  [facility]  PHYSICIAN:  Revonda Standard. Roxan Hockey, M.D.DATE OF BIRTH:  [birth year]  DATE OF PROCEDURE:  06/04/2017 DATE OF DISCHARGE:                              OPERATIVE REPORT   PREOPERATIVE DIAGNOSIS:  Non-small cell carcinoma, left upper lobe, clinical stage IA (T1, N0).  POSTOPERATIVE DIAGNOSIS:  Non-small cell carcinoma, left upper lobe, clinical stage IA (T1, N0).  PROCEDURES: 1. Bronchoscopy, endobronchial ultrasound. 2. Left video-assisted thoracoscopy. 3. Left upper lobectomy. 4. Mediastinal lymph node dissection. 5. Intercostal nerve block.  SURGEON:  Revonda Standard. Roxan Hockey, MD.  ASSISTANT:  Nicholes Rough, PA.  ANESTHESIA:  General.   ADDITIONAL INFORMATION: 1. Dr. Saralyn Pilar (pathologist at the time of frozen) has discussed the case with Dr. Roxan Hockey. At the time of frozen there was a thin rim of tissue overlying the tumor at the stapled parenchymal margin, and it is felt that the ink seen on the slides was artifact (run off) due to inking after sectioning and thus the parenchymal margins is likely negative. Karen Males MD Pathologist, Electronic Signature ( Signed 06/07/2017) FINAL DIAGNOSIS Diagnosis 1. Lung, resection (segmental or lobe), Left Upper Lobe -  ADENOCARCINOMA WITH MUCINOUS FEATURES, WELL DIFFERENTIATED, SPANNING 2 CM. - BRONCHIAL AND VASCULAR MARGINS ARE NEGATIVE. - TUMOR INVOLVES STAPLED PARENCHYMAL MARGIN NEAR HILUM. - TWO OF TWO LYMPH NODES NEGATIVE FOR CARCINOMA (0/2). - SEE ONCOLOGY TABLE. 2. Lymph node, biopsy, 10 - ONE OF ONE LYMPH NODES NEGATIVE FOR CARCINOMA (0/1). 3. Lymph node, biopsy, 11 - ONE OF ONE LYMPH NODES NEGATIVE FOR CARCINOMA (0/1). 4. Lymph node, biopsy, 10 #2 - ONE OF ONE LYMPH NODES NEGATIVE FOR CARCINOMA (0/1). 5. Lymph node, biopsy, 12 - ONE OF ONE LYMPH NODES NEGATIVE FOR CARCINOMA (0/1). 6. Lymph node, biopsy, 12 #2 - ONE OF ONE LYMPH NODES NEGATIVE FOR CARCINOMA (0/1). 7. Lymph node, biopsy, 5 - ONE OF ONE LYMPH NODES NEGATIVE FOR CARCINOMA (0/1). 1 of 4 FINAL for Karen West, Karen West ([id number]) Diagnosis(continued) 8. Lymph node, biopsy, 5 #2 - ONE OF ONE LYMPH NODES NEGATIVE FOR CARCINOMA (0/1). 9. Lymph node, biopsy, 4 - ONE  OF ONE LYMPH NODES NEGATIVE FOR CARCINOMA (0/1). 10. Lymph node, biopsy, 7 #2 - ONE OF ONE LYMPH NODES NEGATIVE FOR CARCINOMA (0/1).  Microscopic Comment 1. LUNG Specimen, including laterality: Left upper lobe and lymph nodes. Procedure: Left upper lobectomy and lymph node biopsies. Specimen integrity (intact/disrupted): Intact. Tumor site: Left upper lobe. Tumor focality: Unifocal. Maximum tumor size (cm): 2 cm. Histologic type: Adenocarcinoma with mucinous features. Grade: G1, well differentiated. Margins: Involves stapled parenchymal margin near hilum. Bronchial and vascular margins are negative. Distance to closest margin (cm): See above. Visceral pleura invasion: Not identified. Tumor extension: Limited to lung. Treatment effect (if treated with neoadjuvant therapy): N/A. Lymph -Vascular invasion: Not identified. Lymph nodes: Number examined - 11 ; Number N1 nodes positive 0 ; Number N2 nodes positive 0 TNM code: pT1b, pN0 Ancillary Studies: Best tumor  block for sendout testing: 1C-36F Non-neoplastic lung: Emphysematous change. Comments: None. Karen Males MD Pathologist, Electronic Signature (Case signed 06/05/2017) Intraoperative Diagnosis 1. LEFT UPPER LOBE BRONCHIAL MARGINS: BENIGN. (JDP) Specimen Gross and Clinical Information Specimen(s) Obtained: 1. Lung, resection (segmental or lobe), Left Upper Lobe 2. Lymph node, biopsy, 10 3. Lymph node, biopsy, 11 4. Lymph node, biopsy, 10 #2 5. Lymph node, biopsy, 12 6. Lymph node, biopsy, 12 #2 2 of 4 FINAL for Karen West, Karen West [phone]) Specimen(s) Obtained:(continued) 7. Lymph node, biopsy, 5 8. Lymph node, biopsy, 5 #2 9. Lymph node, biopsy, 4 10. Lymph node, biopsy, 7 #2 Specimen Clinical Information 1. LUL Adenocarcinoma. (nt) Gross 1. Specimen: Received fresh for rapid intraoperative consultation labeled left upper lobe. Specimen integrity (intact/incised/disrupted): Intact, with multiple stapled resection lines. Size, weight: 20.0 x 13.5 x 3.2 cm, 142 grams. Pleura: Tan pink to purple, smooth, hyperemic, with mild anthracosis. A possible lesion is palpated close the hilum of the lung, with an overlying stapled parenchymal line. Upon removing the staple line, the underlying parenchyma is inked black. Lesion: There is a 2.0 x 1.7 x 1.6 cm tan pink, ill defined, softened nodule identified. The lesion abuts the inked parenchyma. Margin(s): The lesion measures 1.0 cm from the bronchial resection margin, which is submitted for frozen analysis. Hilar vessels: Grossly unremarkable. Nonneoplastic parenchyma: Spongy, tan red with mild anthracosis. Lymph nodes, level 12: Identified at the hilum are two tan gray, anthracotic possible lymph nodes measuring 0.5 and 0.7 cm in greatest dimension. Block Summary: 9 blocks submitted. A= bronchial frozen section remnant. B= vascular resection margins. C-F= lesion. G= bronchus adjacent to lesion. H= uninvolved parenchyma. I= two  possible lymph nodes. (KL:gt, 06/04/17) 2. Received in saline and consists of a 1.7 x 1.4 x 0.7 cm red gray, anthracotic possible lymph node. The specimen is bisected and entirely submitted in one cassette. 3. Received in saline and consists of a 1.1 x 0.7 x 0.4 cm red gray, anthracotic possible lymph node. The specimen is entirely submitted in one cassette. 4. Received in saline and consists of a 1.3 x 0.7 x 0.6 cm red gray, anthracotic possible lymph node. The specimen is bisected and entirely submitted in one cassette. 5. Received in saline and consists of a 0.7 x 0.5 x 0.2 cm red gray, anthracotic possible lymph node. The specimen is entirely submitted in one cassette. 6. Received in saline and consists of two pieces of red gray, anthracotic possible lymph node measuring 0.7 x 0.6 x 0.2 cm and 0.8 x 0.4 x 0.3 cm. The specimen is entirely submitted in one cassette. 7. Received in saline and consists of a 1.8 x 1.4  x 0.6 cm aggregate of tan gray, anthracotic soft tissue. The specimen is entirely submitted in one cassette. 8. Received in saline and consists of a 1.7 x 1.2 x 0.7 cm aggregate of tan gray, anthracotic soft tissue and adipose tissue. The specimen is entirely submitted in one cassette. 9. Received in saline and consists of a 1.6 x 0.8 x 0.3 cm piece of tan gray, anthracotic possible lymph node and surrounding adipose. The specimen is entirely submitted in one cassette. 10. Received in saline and consists of a 1.7 x 1.1 x 0.8 cm piece of red gray, anthracotic possible lymph node. The specimen is bisected and entirely submitted in one cassette. (KL:gt, 06/04/17) 3 of 4 FINAL for [place], HANLON ([id number]) Report signed out from the following location(s) Technical Component was performed at [facility]. [address],[place] [id number].[id number]., Technical component and interpretation was performed at [facility], [place], [place] [address]. CLIA #: [id number],    Discharge Exam: Blood pressure (!) 167/76, pulse 86, temperature 97.7 F (36.5 C), temperature source Oral, resp. rate 20, height 5\' 2"  (1.575 m), weight 153 lb 9.6 oz (69.7 kg), SpO2 97 %.   General appearance: alert, cooperative and no distress Heart: regular rate and rhythm, S1, S2 normal, no murmur, click, rub or gallop Lungs: clear to auscultation bilaterally and diminshed breath sounds in the lower lobes Abdomen: soft, non-tender; bowel sounds normal; no masses,  no organomegaly Extremities: extremities normal, atraumatic, no cyanosis or edema Wound: clean and dry    Disposition: Discharge disposition: 01-Home or Self Care        Allergies as of 06/15/2017      Reactions   Tramadol Nausea And Vomiting, Other (See Comments)   "got sick to my stomach and was throwing up blood; it put me in the hospital")   Lisinopril Rash, Other (See Comments)   Renal failure   Tapazole [methimazole] Itching, Rash      Medication List    STOP taking these medications   calcium carbonate 500 MG chewable tablet Commonly known as:  TUMS     TAKE these medications   acetaminophen 500 MG tablet Commonly known as:  TYLENOL Take 1,000 mg by mouth every 6 (six) hours as needed for moderate pain or headache.   amLODipine 10 MG tablet Commonly known as:  NORVASC Take 10 mg by mouth daily.   aspirin 81 MG tablet Take 81 mg by mouth daily.   calcium carbonate 1250 (500 Ca) MG tablet Commonly known as:  OS-CAL - dosed in mg of elemental calcium Take 1 tablet (500 mg of elemental calcium total) by mouth 3 (three) times daily with meals.   capsaicin 0.025 % cream Commonly known as:  ZOSTRIX Apply 1 application topically daily as needed (for knee pain).   CLEAR EYES OP Place 1 drop into both eyes daily as needed (for dry eyes).   ezetimibe 10 MG tablet Commonly known as:  ZETIA Take 5 mg by mouth daily.   Fish Oil 1200  MG Caps Take 1,200 mg by mouth 2 (two) times daily.   glipiZIDE 10 MG 24 hr tablet Commonly known as:  GLUCOTROL XL Take 10 mg by mouth 2 (two) times daily.   ibuprofen 200 MG tablet Commonly known as:  ADVIL,MOTRIN Take 400 mg by mouth every 6 (six) hours as needed.   levothyroxine 75 MCG tablet Commonly known as:  SYNTHROID Take 1 tablet (75 mcg total) by mouth  daily before breakfast.   magnesium oxide 400 (241.3 Mg) MG tablet Commonly known as:  MAG-OX Take 2 tablets (800 mg total) by mouth 2 (two) times daily.   metFORMIN 1000 MG tablet Commonly known as:  GLUCOPHAGE Take 1,000 mg by mouth 2 (two) times daily.   metoprolol tartrate 25 MG tablet Commonly known as:  LOPRESSOR Take 1 tablet (25 mg total) by mouth 2 (two) times daily.   omeprazole 20 MG capsule Commonly known as:  PRILOSEC Take 20 mg by mouth 2 (two) times daily.   oxyCODONE 5 MG immediate release tablet Commonly known as:  Oxy IR/ROXICODONE Take 1 tablet (5 mg total) by mouth every 6 (six) hours as needed for severe pain.   rosuvastatin 20 MG tablet Commonly known as:  CRESTOR Take 20 mg by mouth daily.   triamterene-hydrochlorothiazide 37.5-25 MG tablet Commonly known as:  MAXZIDE-25 Take 1 tablet by mouth daily. What changed:  how much to take   Vitamin D3 1000 units Caps Take 1,000 Units by mouth daily.            Durable Medical Equipment  (From admission, onward)        Start     Ordered   06/15/17 1324  For home use only DME Walker rolling  Once    Question:  Patient needs a walker to treat with the following condition  Answer:  S/P lobectomy of lung   06/15/17 1324   06/12/17 1559  For home use only DME Walker rolling  Once    Question:  Patient needs a walker to treat with the following condition  Answer:  Mobility impaired   06/12/17 1559     Follow-up Information    Deland Pretty, MD. Call in 1 day(s).   Specialty:  Internal Medicine Contact information: [address] [place] [address] [phone]        [name], MD Follow up.   Specialty:  Cardiothoracic Surgery Why:  Your appointment is on April 9th at 3:00pm. Please arrive at 2:30pm for a chest xray located at [facility] which is on the first floor of our building.  Contact information: [address] [place] [address] [phone]           Signed: Elgie Collard 06/15/2017, 2:31 PM

## 2017-06-07 ENCOUNTER — Inpatient Hospital Stay (HOSPITAL_COMMUNITY): Payer: Medicare Other

## 2017-06-07 LAB — GLUCOSE, CAPILLARY
Glucose-Capillary: 150 mg/dL — ABNORMAL HIGH (ref 65–99)
Glucose-Capillary: 180 mg/dL — ABNORMAL HIGH (ref 65–99)
Glucose-Capillary: 129 mg/dL — ABNORMAL HIGH (ref 65–99)
Glucose-Capillary: 132 mg/dL — ABNORMAL HIGH (ref 65–99)

## 2017-06-07 NOTE — Progress Notes (Signed)
3 Days Post-Op Procedure(s) (LRB): VIDEO ASSISTED THORACOSCOPY (VATS)/LEFT UPPER LOBECTOMY (Left) VIDEO BRONCHOSCOPY WITH ENDOBRONCHIAL ULTRASOUND (N/A) Subjective: Some discomfort from tube otherwise feels well  Objective: Vital signs in last 24 hours: Temp:  [97.5 F (36.4 C)-98 F (36.7 C)] 97.6 F (36.4 C) (03/21 0831) Pulse Rate:  [88-97] 97 (03/21 0831) Cardiac Rhythm: Normal sinus rhythm (03/21 0350) Resp:  [19-20] 20 (03/21 0831) BP: (114-148)/(57-71) 123/57 (03/21 0831) SpO2:  [96 %-100 %] 98 % (03/21 0831)  Hemodynamic parameters for last 24 hours:    Intake/Output from previous day: 03/20 0701 - 03/21 0700 In: 720 [P.O.:720] Out: 265 [Urine:200; Chest Tube:65] Intake/Output this shift: No intake/output data recorded.  General appearance: alert, cooperative and no distress Neurologic: intact Heart: regular rate and rhythm Lungs: diminished breath sounds left base Wound: clean and dry + air leak  Lab Results: Recent Labs    06/05/17 0402 06/06/17 0525  WBC 7.8 8.8  HGB 7.8* 7.5*  HCT 23.4* 23.1*  PLT 218 200   BMET:  Recent Labs    06/05/17 0402 06/06/17 0525  NA 140 137  K 3.3* 3.7  CL 101 103  CO2 23 24  GLUCOSE 162* 119*  BUN 24* 20  CREATININE 1.02* 1.05*  CALCIUM 7.4* 6.6*    PT/INR: No results for input(s): LABPROT, INR in the last 72 hours. ABG    Component Value Date/Time   PHART 7.460 (H) 06/05/2017 0510   HCO3 26.2 06/05/2017 0510   O2SAT 95.1 06/05/2017 0510   CBG (last 3)  Recent Labs    06/06/17 1708 06/06/17 2127 06/07/17 0832  GLUCAP 167* 145* 150*    Assessment/Plan: S/P Procedure(s) (LRB): VIDEO ASSISTED THORACOSCOPY (VATS)/LEFT UPPER LOBECTOMY (Left) VIDEO BRONCHOSCOPY WITH ENDOBRONCHIAL ULTRASOUND (N/A) -POD # 3 doing well overall Still has a small air leak- keep CT to water seal Continue SCD + enoxaparin for DVT prophylaxis Continue ambulation Pain well controlled CBG better   LOS: 3 days    Karen West 06/07/2017

## 2017-06-08 ENCOUNTER — Encounter (HOSPITAL_COMMUNITY): Payer: Self-pay | Admitting: Nurse Practitioner

## 2017-06-08 ENCOUNTER — Inpatient Hospital Stay (HOSPITAL_COMMUNITY): Payer: Medicare Other

## 2017-06-08 DIAGNOSIS — I472 Ventricular tachycardia: Secondary | ICD-10-CM

## 2017-06-08 LAB — COMPREHENSIVE METABOLIC PANEL
ALT: 13 U/L — ABNORMAL LOW (ref 14–54)
AST: 20 U/L (ref 15–41)
Albumin: 2.8 g/dL — ABNORMAL LOW (ref 3.5–5.0)
Alkaline Phosphatase: 51 U/L (ref 38–126)
Anion gap: 11 (ref 5–15)
BUN: 30 mg/dL — ABNORMAL HIGH (ref 6–20)
CO2: 17 mmol/L — ABNORMAL LOW (ref 22–32)
Calcium: 6 mg/dL — CL (ref 8.9–10.3)
Chloride: 105 mmol/L (ref 101–111)
Creatinine, Ser: 1.18 mg/dL — ABNORMAL HIGH (ref 0.44–1.00)
GFR calc Af Amer: 51 mL/min — ABNORMAL LOW (ref >60)
GFR calc non Af Amer: 44 mL/min — ABNORMAL LOW (ref >60)
Glucose, Bld: 172 mg/dL — ABNORMAL HIGH (ref 65–99)
Potassium: 4 mmol/L (ref 3.5–5.1)
Sodium: 133 mmol/L — ABNORMAL LOW (ref 135–145)
Total Bilirubin: 0.4 mg/dL (ref 0.3–1.2)
Total Protein: 5.8 g/dL — ABNORMAL LOW (ref 6.5–8.1)

## 2017-06-08 LAB — GLUCOSE, CAPILLARY
Glucose-Capillary: 130 mg/dL — ABNORMAL HIGH (ref 65–99)
Glucose-Capillary: 180 mg/dL — ABNORMAL HIGH (ref 65–99)
Glucose-Capillary: 154 mg/dL — ABNORMAL HIGH (ref 65–99)
Glucose-Capillary: 133 mg/dL — ABNORMAL HIGH (ref 65–99)

## 2017-06-08 LAB — BASIC METABOLIC PANEL
Anion gap: 12 (ref 5–15)
BUN: 31 mg/dL — ABNORMAL HIGH (ref 6–20)
CO2: 21 mmol/L — ABNORMAL LOW (ref 22–32)
Calcium: 6.2 mg/dL — CL (ref 8.9–10.3)
Chloride: 102 mmol/L (ref 101–111)
Creatinine, Ser: 1 mg/dL (ref 0.44–1.00)
GFR calc Af Amer: >60 mL/min (ref >60)
GFR calc non Af Amer: 54 mL/min — ABNORMAL LOW (ref >60)
Glucose, Bld: 92 mg/dL (ref 65–99)
Potassium: 3.4 mmol/L — ABNORMAL LOW (ref 3.5–5.1)
Sodium: 135 mmol/L (ref 135–145)

## 2017-06-08 LAB — CBC
HCT: 23.2 % — ABNORMAL LOW (ref 36.0–46.0)
Hemoglobin: 7.5 g/dL — ABNORMAL LOW (ref 12.0–15.0)
MCH: 27.6 pg (ref 26.0–34.0)
MCHC: 32.3 g/dL (ref 30.0–36.0)
MCV: 85.3 fL (ref 78.0–100.0)
Platelets: 219 10*3/uL (ref 150–400)
RBC: 2.72 MIL/uL — ABNORMAL LOW (ref 3.87–5.11)
RDW: 14.7 % (ref 11.5–15.5)
WBC: 5.3 10*3/uL (ref 4.0–10.5)

## 2017-06-08 LAB — TROPONIN I: Troponin I: <0.03 ng/mL (ref <0.03)

## 2017-06-08 LAB — TSH: TSH: 3.001 u[IU]/mL (ref 0.350–4.500)

## 2017-06-08 MED ORDER — GLUCERNA SHAKE PO LIQD
237.0000 mL | Freq: Three times a day (TID) | ORAL | Status: DC
Start: 2017-06-08 — End: 2017-06-15
  Administered 2017-06-08 – 2017-06-15 (×11): 237 mL via ORAL
  Filled 2017-06-08 (×2): qty 237

## 2017-06-08 MED ORDER — FUROSEMIDE 10 MG/ML IJ SOLN
40.0000 mg | Freq: Once | INTRAMUSCULAR | Status: AC
  Administered 2017-06-08: 40 mg via INTRAVENOUS
  Filled 2017-06-08: qty 4

## 2017-06-08 MED ORDER — MAGNESIUM OXIDE 400 (241.3 MG) MG PO TABS
400.0000 mg | ORAL_TABLET | Freq: Two times a day (BID) | ORAL | Status: DC
  Administered 2017-06-08 – 2017-06-13 (×11): 400 mg via ORAL
  Filled 2017-06-08 (×11): qty 1

## 2017-06-08 MED ORDER — METOPROLOL TARTRATE 12.5 MG HALF TABLET
12.5000 mg | ORAL_TABLET | Freq: Two times a day (BID) | ORAL | Status: DC
  Administered 2017-06-09: 12.5 mg via ORAL
  Filled 2017-06-08 (×2): qty 1

## 2017-06-08 MED ORDER — CALCIUM GLUCONATE 500 MG PO TABS
500.0000 mg | ORAL_TABLET | Freq: Three times a day (TID) | ORAL | Status: DC
  Filled 2017-06-08: qty 1

## 2017-06-08 MED ORDER — POTASSIUM CHLORIDE CRYS ER 20 MEQ PO TBCR
40.0000 meq | EXTENDED_RELEASE_TABLET | Freq: Once | ORAL | Status: AC
  Administered 2017-06-08: 40 meq via ORAL
  Filled 2017-06-08: qty 2

## 2017-06-08 MED ORDER — POTASSIUM CHLORIDE 10 MEQ/50ML IV SOLN
10.0000 meq | INTRAVENOUS | Status: AC
  Administered 2017-06-08 (×3): 10 meq via INTRAVENOUS
  Filled 2017-06-08 (×3): qty 50

## 2017-06-08 MED ORDER — CALCIUM CARBONATE 1250 (500 CA) MG PO TABS
1.0000 | ORAL_TABLET | Freq: Three times a day (TID) | ORAL | Status: DC
  Filled 2017-06-08: qty 1

## 2017-06-08 MED ORDER — CALCIUM CARBONATE 1250 (500 CA) MG PO TABS
1.0000 | ORAL_TABLET | Freq: Three times a day (TID) | ORAL | Status: DC
  Administered 2017-06-08 – 2017-06-15 (×21): 500 mg via ORAL
  Filled 2017-06-08 (×22): qty 1

## 2017-06-08 NOTE — Progress Notes (Signed)
CRITICAL VALUE ALERT  Critical Value:  Calcium 6.0  Date & Time Notied:  18:40  Provider Notified: PA Erin Barrett  Orders Received/Actions taken: PA will order meds. Waiting orders

## 2017-06-08 NOTE — Progress Notes (Signed)
Pt. Had 28 beat run of Vtach. MD notified. EKG order. Will get a repeat of chest xray.
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
## 2017-06-08 NOTE — Progress Notes (Signed)
Contacted by nursing regarding patient not doing as well today as she did yesterday.  She evidently ambulated multiple times yesterday around the unit.  Today however, she has developed acute pain, which she points to at her chest tube site.  She denies chest pain.  She is short of breath with wheezing.  She denies N/V.  She also had a 28 beat run of V. Tach  Gen: no apparent distress Heart: RRR, mild tachycardia Lungs: wheezing bilaterally, diminished in the bases Chest tube- evidence of tidaling, no definitive air leak   EKG: NSR with RBBB, there is mild prolongation of QT interval   A/P:  1. Patient is stable, currently in NSR-- EKG is unremarkable, however with new onset pain, will cycle Troponin levels to ensure no MI is occurring  2. CV- 28 beat run of VTAC, will start Lopressor 12.5 mg BID, mild QT prolongation will monitor 3. Hypokalemic- this morning level was 3.4, this is likely attributing to prolonged qTc 40 meQ oral was placed, will give additional 30 mg IV 4. Pulm- wheezing, diminished breath sounds at bases, albuterol neb given without improvement of symptoms,  Repeat CXR shows worsening atelectasis and near white out of Left side will give 1 dose of IV Lasix 5. Dispo- patient is medically stable, will implement current orders, follow Troponin levels, will touch base with Dr. Roxan Hockey and implement further orders if indicated

## 2017-06-08 NOTE — Progress Notes (Addendum)
NT suction attempted, no return.  Patient intolerant of procedure.  Will start mucomyst nebs.  Patient may be willing to have RT try later  Ellwood Handler, PA-C  Patient seen and examined. She feels a little better now. Still has a weak cough Cardiology consulted re: rhythm Denies any chest pain or shortness of breath at this time.  Revonda Standard Roxan Hockey, MD [facility] [phone]

## 2017-06-08 NOTE — Progress Notes (Signed)
CRITICAL VALUE ALERT  Critical Value:  Calcium 6.2  Date & Time Notied: 06/08/2017 0645   Provider Notified: Night coverage MD  Orders Received/Actions taken: MD stated this value was fine, no new orders were given at this time. Will continue to monitor.

## 2017-06-08 NOTE — Progress Notes (Signed)
Pt. Complaining of pain near chest tube while going to the bathroom. No change in chest tube air leak, lungs still diminished with chest tube rub. Will continue to monitor. Pt states pain has decreased since laying back down in bed.

## 2017-06-08 NOTE — Progress Notes (Signed)
RN called to [facility] patient per MD request. Attempted right nostril with no luck. Was easily passed through left nostril obtaining a moderate amount of clear, whitish thin secretions. Patient dropped her SATs for a brief moment but recovered quickly after catching her breath from suctioning. Patient resting comfortably at this moment. RN made aware.

## 2017-06-08 NOTE — Progress Notes (Addendum)
      [address]       [place] [address]             [phone]      4 Days Post-Op Procedure(s) (LRB): VIDEO ASSISTED THORACOSCOPY (VATS)/LEFT UPPER LOBECTOMY (Left) VIDEO BRONCHOSCOPY WITH ENDOBRONCHIAL ULTRASOUND (N/A) Subjective: Feels okay this morning. Plans to walk in the halls.   Objective: Vital signs in last 24 hours: Temp:  [97.5 F (36.4 C)-97.8 F (36.6 C)] 97.5 F (36.4 C) (03/22 0700) Pulse Rate:  [88-109] 106 (03/22 0700) Cardiac Rhythm: Normal sinus rhythm (03/22 0358) Resp:  [16-24] 19 (03/22 0700) BP: (123-151)/(53-67) 126/67 (03/22 0700) SpO2:  [96 %-99 %] 98 % (03/22 0700)     Intake/Output from previous day: 03/21 0701 - 03/22 0700 In: 840 [P.O.:840] Out: 302 [Urine:202; Chest Tube:100] Intake/Output this shift: No intake/output data recorded.  General appearance: alert, cooperative and no distress Heart: sinus tachycardia Lungs: clear to auscultation bilaterally and +chest tube rub Abdomen: soft, non-tender; bowel sounds normal; no masses,  no organomegaly Extremities: extremities normal, atraumatic, no cyanosis or edema Wound: clean and dry  Lab Results: Recent Labs    06/06/17 0525 06/08/17 0420  WBC 8.8 5.3  HGB 7.5* 7.5*  HCT 23.1* 23.2*  PLT 200 219   BMET:  Recent Labs    06/06/17 0525 06/08/17 0420  NA 137 135  K 3.7 3.4*  CL 103 102  CO2 24 21*  GLUCOSE 119* 92  BUN 20 31*  CREATININE 1.05* 1.00  CALCIUM 6.6* 6.2*    PT/INR: No results for input(s): LABPROT, INR in the last 72 hours. ABG    Component Value Date/Time   PHART 7.460 (H) 06/05/2017 0510   HCO3 26.2 06/05/2017 0510   O2SAT 95.1 06/05/2017 0510   CBG (last 3)  Recent Labs    06/07/17 1212 06/07/17 1620 06/07/17 2132  GLUCAP 180* 129* 132*    Assessment/Plan: S/P Procedure(s) (LRB): VIDEO ASSISTED THORACOSCOPY (VATS)/LEFT UPPER LOBECTOMY (Left) VIDEO BRONCHOSCOPY WITH ENDOBRONCHIAL ULTRASOUND (N/A)  1. CXR showed near  completion of left pneumothorax. Chest tube is on water seal with + air leak. Continue chest tube.  2. ST in the 100s, BP well controlled. On ASA, Amlodipine, and Crestor 3. Renal-creatinine 1.00, replace potassium.  4. H and H  7.5/23.2, acute blood loss anemia. Asymptomatic 5. Endo-back on home oral diabetes medications. Blood glucose level is well controlled.  6. Continue SCD and enoxaparin for DVT prophylaxis 7. Will order Glucerna for protein supplementation. 8. Patient is asking about Pathology report, Dr. Roxan Hockey to review with the patient.      LOS: 4 days    Karen West 06/08/2017 Patient seen and examined, agree with above Has large tidal variation in tube with a small air leak- keep tube today Reviewed path specimen with Dr. Saralyn Pilar who did the frozen on the specimen. He feels that tumor at margin is due to how the specimen was cut and inked not due to true invasion at the margin. The tumor was in close proximity to the fissure.  Karen West Roxan Hockey, MD [facility] [phone]

## 2017-06-08 NOTE — Progress Notes (Signed)
Walked with pt. 40 ft and pt. Became SOB and expiratory wheezes.  MD called. PRN albuterol given and pain medicine. Will continue to monitor pt.

## 2017-06-08 NOTE — Care Management Important Message (Signed)
Important Message  Patient Details  Name: DYNASIA KERCHEVAL MRN: [id number] Date of Birth: Jan 30, 1941   Medicare Important Message Given:  Yes    Barb Merino Eulalio Reamy 06/08/2017, 2:44 PM

## 2017-06-09 ENCOUNTER — Inpatient Hospital Stay (HOSPITAL_COMMUNITY): Payer: Medicare Other

## 2017-06-09 DIAGNOSIS — R000 Tachycardia, unspecified: Secondary | ICD-10-CM

## 2017-06-09 DIAGNOSIS — I361 Nonrheumatic tricuspid (valve) insufficiency: Secondary | ICD-10-CM

## 2017-06-09 DIAGNOSIS — Z902 Acquired absence of lung [part of]: Secondary | ICD-10-CM

## 2017-06-09 DIAGNOSIS — R9431 Abnormal electrocardiogram [ECG] [EKG]: Secondary | ICD-10-CM

## 2017-06-09 LAB — GLUCOSE, CAPILLARY
Glucose-Capillary: 92 mg/dL (ref 65–99)
Glucose-Capillary: 191 mg/dL — ABNORMAL HIGH (ref 65–99)
Glucose-Capillary: 170 mg/dL — ABNORMAL HIGH (ref 65–99)
Glucose-Capillary: 154 mg/dL — ABNORMAL HIGH (ref 65–99)

## 2017-06-09 LAB — BASIC METABOLIC PANEL
Anion gap: 9 (ref 5–15)
BUN: 28 mg/dL — ABNORMAL HIGH (ref 6–20)
CO2: 22 mmol/L (ref 22–32)
Calcium: 6.2 mg/dL — CL (ref 8.9–10.3)
Chloride: 105 mmol/L (ref 101–111)
Creatinine, Ser: 1.19 mg/dL — ABNORMAL HIGH (ref 0.44–1.00)
GFR calc Af Amer: 51 mL/min — ABNORMAL LOW (ref >60)
GFR calc non Af Amer: 44 mL/min — ABNORMAL LOW (ref >60)
Glucose, Bld: 73 mg/dL (ref 65–99)
Potassium: 3.7 mmol/L (ref 3.5–5.1)
Sodium: 136 mmol/L (ref 135–145)

## 2017-06-09 LAB — MAGNESIUM
Magnesium: 0.9 mg/dL — CL (ref 1.7–2.4)
Magnesium: 1 mg/dL — ABNORMAL LOW (ref 1.7–2.4)

## 2017-06-09 LAB — CBC
HCT: 21.1 % — ABNORMAL LOW (ref 36.0–46.0)
Hemoglobin: 7.1 g/dL — ABNORMAL LOW (ref 12.0–15.0)
MCH: 28.9 pg (ref 26.0–34.0)
MCHC: 33.6 g/dL (ref 30.0–36.0)
MCV: 85.8 fL (ref 78.0–100.0)
Platelets: 246 10*3/uL (ref 150–400)
RBC: 2.46 MIL/uL — ABNORMAL LOW (ref 3.87–5.11)
RDW: 15 % (ref 11.5–15.5)
WBC: 5.1 10*3/uL (ref 4.0–10.5)

## 2017-06-09 LAB — ECHOCARDIOGRAM COMPLETE
Height: 62 in
Weight: 2457.6 oz

## 2017-06-09 MED ORDER — METOPROLOL TARTRATE 25 MG PO TABS
25.0000 mg | ORAL_TABLET | Freq: Two times a day (BID) | ORAL | Status: DC
  Administered 2017-06-09 – 2017-06-15 (×12): 25 mg via ORAL
  Filled 2017-06-09 (×12): qty 1

## 2017-06-09 MED ORDER — MAGNESIUM SULFATE 50 % IJ SOLN
6.0000 g | Freq: Once | INTRAVENOUS | Status: AC
  Administered 2017-06-09: 6 g via INTRAVENOUS
  Filled 2017-06-09: qty 12

## 2017-06-09 NOTE — Progress Notes (Addendum)
Progress Note  Patient Name: Karen West Date of Encounter: 06/09/2017  Primary Cardiologist: Minus Breeding, MD   Subjective   The patient is sitting in chair, denies any palpitations, or chest pain.   Inpatient Medications    Scheduled Meds: . amLODipine  10 mg Oral Daily  . aspirin EC  81 mg Oral Daily  . bisacodyl  10 mg Oral Daily  . calcium carbonate  1 tablet Oral TID WC  . Chlorhexidine Gluconate Cloth  6 each Topical Daily  . enoxaparin (LOVENOX) injection  40 mg Subcutaneous Daily  . ezetimibe  5 mg Oral Daily  . feeding supplement (GLUCERNA SHAKE)  237 mL Oral TID WC  . glipiZIDE  10 mg Oral BID WC  . guaiFENesin  1,200 mg Oral BID  . insulin aspart  0-15 Units Subcutaneous TID WC  . insulin aspart  0-5 Units Subcutaneous QHS  . levothyroxine  75 mcg Oral QAC breakfast  . magnesium oxide  400 mg Oral BID  . mouth rinse  15 mL Mouth Rinse BID  . metFORMIN  1,000 mg Oral BID WC  . metoprolol tartrate  12.5 mg Oral BID  . pantoprazole  40 mg Oral Daily  . rosuvastatin  20 mg Oral Daily  . senna-docusate  1 tablet Oral QHS  . sodium chloride flush  10-40 mL Intracatheter Q12H  . triamterene-hydrochlorothiazide  0.5 tablet Oral Daily   Continuous Infusions: . magnesium sulfate 1 - 4 g bolus IVPB 6 g (06/09/17 1133)  . potassium chloride     PRN Meds: albuterol, capsaicin, fentaNYL (SUBLIMAZE) injection, ibuprofen, ondansetron (ZOFRAN) IV, oxyCODONE, potassium chloride, sodium chloride flush   Vital Signs    Vitals:   06/09/17 0800 06/09/17 1103 06/09/17 1106 06/09/17 1204  BP: 127/62 (!) 117/54  131/67  Pulse: 96  (!) 101 93  Resp: 19   (!) 23  Temp: 97.8 F (36.6 C)   97.9 F (36.6 C)  TempSrc: Oral   Oral  SpO2: 100%   99%  Weight:      Height:        Intake/Output Summary (Last 24 hours) at 06/09/2017 1235 Last data filed at 06/09/2017 1000 Gross per 24 hour  Intake 540 ml  Output 1730 ml  Net -1190 ml   Filed Weights   06/04/17 0546  06/05/17 0600  Weight: 152 lb (68.9 kg) 153 lb 9.6 oz (69.7 kg)    Telemetry    SR, 75-100 BPM - Personally Reviewed  Physical Exam   GEN: No acute distress.   Neck: No JVD Cardiac: RRR, no murmurs, rubs, or gallops.  Respiratory: mnimal crackles at the bases, chest tube in place GI: Soft, nontender, non-distended  MS: No edema; No deformity. Neuro:  Nonfocal  Psych: Normal affect   Labs    Chemistry Recent Labs  Lab 06/06/17 0525 06/08/17 0420 06/08/17 1649 06/09/17 0445  NA 137 135 133* 136  K 3.7 3.4* 4.0 3.7  CL 103 102 105 105  CO2 24 21* 17* 22  GLUCOSE 119* 92 172* 73  BUN 20 31* 30* 28*  CREATININE 1.05* 1.00 1.18* 1.19*  CALCIUM 6.6* 6.2* 6.0* 6.2*  PROT 5.7*  --  5.8*  --   ALBUMIN 3.1*  --  2.8*  --   AST 21  --  20  --   ALT 10*  --  13*  --   ALKPHOS 42  --  51  --   BILITOT 0.5  --  0.4  --   GFRNONAA 51* 54* 44* 44*  GFRAA 59* >60 51* 51*  ANIONGAP 10 12 11 9      Hematology Recent Labs  Lab 06/06/17 0525 06/08/17 0420 06/09/17 0445  WBC 8.8 5.3 5.1  RBC 2.68* 2.72* 2.46*  HGB 7.5* 7.5* 7.1*  HCT 23.1* 23.2* 21.1*  MCV 86.2 85.3 85.8  MCH 28.0 27.6 28.9  MCHC 32.5 32.3 33.6  RDW 15.0 14.7 15.0  PLT 200 219 246    Cardiac Enzymes Recent Labs  Lab 06/08/17 1648  TROPONINI <0.03   No results for input(s): TROPIPOC in the last 168 hours.   BNPNo results for input(s): BNP, PROBNP in the last 168 hours.   DDimer No results for input(s): DDIMER in the last 168 hours.   Radiology    Dg Chest Port 1 View  Result Date: 06/09/2017 CLINICAL DATA:  Left chest tube. EXAM: PORTABLE CHEST 1 VIEW COMPARISON:  Yesterday. FINDINGS: A left chest tube remains in place and is slightly lower in position with a mild increase in size of a left upper pneumothorax, currently estimated at 5-10% of the hemithorax. Left lung surgical clips and staples are again demonstrated. Small amount of right perihilar linear density without significant change.  Stable right jugular catheter and mildly enlarged cardiac silhouette with dense annulus calcifications. Right axillary surgical clips are again demonstrated as well as thoracic spine degenerative changes and bilateral shoulder degenerative changes. Surgical clips are again demonstrated at the thoracic inlet bilaterally. IMPRESSION: 1. Mild increase in size of a small left upper pneumothorax, currently estimated at 5-10% of the volume of the left hemithorax. 2. Stable mild cardiomegaly and small amount of right perihilar linear atelectasis or scarring. Electronically Signed   By: Claudie Revering M.D.   On: 06/09/2017 07:33   Dg Chest Port 1 View  Result Date: 06/08/2017 CLINICAL DATA:  Status post left lobectomy EXAM: PORTABLE CHEST 1 VIEW COMPARISON:  06/07/2017 FINDINGS: Right jugular central line and left thoracostomy catheter are again seen and stable. Previously seen left pneumothorax has nearly completely resolved with only a tiny residual component identified in the apex. No acute bony abnormality is noted. Very minimal left basilar atelectasis is seen. Stable right basilar atelectasis is noted. IMPRESSION: Near complete resolution of left pneumothorax. Stable bibasilar atelectatic changes. No new focal abnormality is noted. Electronically Signed   By: Inez Catalina M.D.   On: 06/08/2017 06:48   Dg Chest Port 1v Same Day  Result Date: 06/08/2017 CLINICAL DATA:  Pneumothorax EXAM: PORTABLE CHEST 1 VIEW COMPARISON:  06/08/2017, 06/07/2017, 06/06/2017 FINDINGS: Left-sided chest tube remains in place. Probable tiny left apical pneumothorax without significant change. Right-sided central venous catheter tip overlies the right atrial region. Low lung volumes. Probable small left effusion. Cardiomegaly with dense mitral calcification. Postoperative changes of the left chest. Left rib fractures. Small amount of left chest wall subcutaneous gas. Surgical clips in the right axillary region. IMPRESSION: 1. Left-sided  chest tube remains in place. Probable residual tiny left apical pneumothorax, not significantly changed 2. Low lung volumes with cardiomegaly and left basilar atelectasis. Electronically Signed   By: Donavan Foil M.D.   On: 06/08/2017 17:26    Cardiac Studies   Echo is pending  Patient Profile     77 y.o. female   Assessment & Plan    5 Days Post-Op Procedure(s) (LRB): VIDEO ASSISTED THORACOSCOPY (VATS)/LEFT UPPER LOBECTOMY (Left) VIDEO BRONCHOSCOPY WITH ENDOBRONCHIAL ULTRASOUND (N/A)   1. Sinus tachycardia and prolonged QTc, normal  QT, ns VT with 28 beats yesterday.  The patient is anemic, that is also contributing to tachycardia. Currently on metoprolol 12.5 mg po BID, I would increase to 25 mg po BID. Protonix and albuterol contributing to prolonged QT however necessary for therapy. Echo is pending, none since 2013. No significant fluid overload.  Continue replacement of Mg and Ca.  For questions or updates, please contact [place] Please consult [email or web address] for contact info under Cardiology/STEMI.      Signed, Ena Dawley, MD  06/09/2017, 12:35 PM

## 2017-06-09 NOTE — Progress Notes (Addendum)
      [address]       [place] [address]             [phone]      5 Days Post-Op Procedure(s) (LRB): VIDEO ASSISTED THORACOSCOPY (VATS)/LEFT UPPER LOBECTOMY (Left) VIDEO BRONCHOSCOPY WITH ENDOBRONCHIAL ULTRASOUND (N/A)   Subjective:  No new complaints.  States feeling a little better than yesterday.  Denies chest pain, shortness of breath  + ambulation  + BM  Objective: Vital signs in last 24 hours: Temp:  [97.6 F (36.4 C)-98 F (36.7 C)] 97.8 F (36.6 C) (03/23 0800) Pulse Rate:  [91-104] 96 (03/23 0800) Cardiac Rhythm: Normal sinus rhythm (03/23 0810) Resp:  [16-27] 19 (03/23 0800) BP: (125-140)/(53-64) 127/62 (03/23 0800) SpO2:  [94 %-100 %] 100 % (03/23 0800)  Intake/Output from previous day: 03/22 0701 - 03/23 0700 In: 770 [P.O.:720; IV Piggyback:50] Out: 2130 [Urine:2100; Chest Tube:30]  General appearance: alert, cooperative and no distress Heart: regular rate and rhythm Lungs: coarse throughout Abdomen: soft, non-tender; bowel sounds normal; no masses,  no organomegaly Extremities: extremities normal, atraumatic, no cyanosis or edema Wound: clean and dry  Lab Results: Recent Labs    06/08/17 0420 06/09/17 0445  WBC 5.3 5.1  HGB 7.5* 7.1*  HCT 23.2* 21.1*  PLT 219 246   BMET:  Recent Labs    06/08/17 1649 06/09/17 0445  NA 133* 136  K 4.0 3.7  CL 105 105  CO2 17* 22  GLUCOSE 172* 73  BUN 30* 28*  CREATININE 1.18* 1.19*  CALCIUM 6.0* 6.2*    PT/INR: No results for input(s): LABPROT, INR in the last 72 hours. ABG    Component Value Date/Time   PHART 7.460 (H) 06/05/2017 0510   HCO3 26.2 06/05/2017 0510   O2SAT 95.1 06/05/2017 0510   CBG (last 3)  Recent Labs    06/08/17 1124 06/08/17 1715 06/08/17 2306  GLUCAP 180* 154* 133*    Assessment/Plan: S/P Procedure(s) (LRB): VIDEO ASSISTED THORACOSCOPY (VATS)/LEFT UPPER LOBECTOMY (Left) VIDEO BRONCHOSCOPY WITH ENDOBRONCHIAL ULTRASOUND (N/A)  1. Chest tube- 1+  air leak with cough, CXR with mild increase in apical space of 5-10%, atelectasis looks a little improved... Leave chest tube in place today 2. CV- Sinus Tachycardia with PVCs, mild prolongation in Qtc likely due to electrolyte imbalances appreciate Cardiology recommendations, will continue Lopressor at 25 mg BID 3. Hypocalcemia, level at 6.2- will continue calcium gluconate supplements, patient with recent thyroidectomy which is likely attributing to low calcium levels 4. Hypomagnesemia-1.0 this morning, continue magnesium supplementation, this should improve as calcium levels increase 5. Chronic Anemia- Hgb low at 7.1, patient wants to avoid transfusion, continue to monitior if drops any lower, would likely require transfusion 6. HTN- continue home medication 7. Dispo- patient stable, continues to have Sinus Tach now with PVCs will continue BB, correct calcium and magnesium levels.. Supplements have been ordered, leave chest tube on water seal today.  LOS: 5 days    Ellwood Handler 06/09/2017  I have seen and examined the patient and agree with the assessment and plan as outlined.  Rexene Alberts, MD 06/09/2017 10:23 AM

## 2017-06-09 NOTE — Progress Notes (Signed)
  Echocardiogram 2D Echocardiogram has been performed.  Darlina Sicilian M 06/09/2017, 3:01 PM

## 2017-06-10 ENCOUNTER — Inpatient Hospital Stay (HOSPITAL_COMMUNITY): Payer: Medicare Other

## 2017-06-10 LAB — CBC
HCT: 21.6 % — ABNORMAL LOW (ref 36.0–46.0)
Hemoglobin: 7.1 g/dL — ABNORMAL LOW (ref 12.0–15.0)
MCH: 27.8 pg (ref 26.0–34.0)
MCHC: 32.9 g/dL (ref 30.0–36.0)
MCV: 84.7 fL (ref 78.0–100.0)
Platelets: 299 10*3/uL (ref 150–400)
RBC: 2.55 MIL/uL — ABNORMAL LOW (ref 3.87–5.11)
RDW: 14.8 % (ref 11.5–15.5)
WBC: 6.1 10*3/uL (ref 4.0–10.5)

## 2017-06-10 LAB — MAGNESIUM
Magnesium: 2.3 mg/dL (ref 1.7–2.4)
Magnesium: 2.3 mg/dL (ref 1.7–2.4)

## 2017-06-10 LAB — BASIC METABOLIC PANEL
Anion gap: 11 (ref 5–15)
BUN: 23 mg/dL — ABNORMAL HIGH (ref 6–20)
CO2: 24 mmol/L (ref 22–32)
Calcium: 6.7 mg/dL — ABNORMAL LOW (ref 8.9–10.3)
Chloride: 104 mmol/L (ref 101–111)
Creatinine, Ser: 1.11 mg/dL — ABNORMAL HIGH (ref 0.44–1.00)
GFR calc Af Amer: 55 mL/min — ABNORMAL LOW (ref >60)
GFR calc non Af Amer: 48 mL/min — ABNORMAL LOW (ref >60)
Glucose, Bld: 85 mg/dL (ref 65–99)
Potassium: 3.7 mmol/L (ref 3.5–5.1)
Sodium: 139 mmol/L (ref 135–145)

## 2017-06-10 LAB — GLUCOSE, CAPILLARY
Glucose-Capillary: 88 mg/dL (ref 65–99)
Glucose-Capillary: 203 mg/dL — ABNORMAL HIGH (ref 65–99)
Glucose-Capillary: 151 mg/dL — ABNORMAL HIGH (ref 65–99)
Glucose-Capillary: 108 mg/dL — ABNORMAL HIGH (ref 65–99)

## 2017-06-10 MED ORDER — LORAZEPAM 2 MG/ML IJ SOLN
INTRAMUSCULAR | Status: AC
  Filled 2017-06-10: qty 1

## 2017-06-10 NOTE — Progress Notes (Addendum)
      [address]       [place] [address]             [phone]      6 Days Post-Op Procedure(s) (LRB): VIDEO ASSISTED THORACOSCOPY (VATS)/LEFT UPPER LOBECTOMY (Left) VIDEO BRONCHOSCOPY WITH ENDOBRONCHIAL ULTRASOUND (N/A)   Subjective:  Patient states she is feeling much better.  Feels like she has been breathing better overall and she has been able to get some sputum up with coughing, just little bits at a time.  Per nursing she was refusing her Lopressor, I explained the indication for the medication and she is agreeable to take now that she understands why it is ordered.  Objective: Vital signs in last 24 hours: Temp:  [97.6 F (36.4 C)-98.6 F (37 C)] 98.6 F (37 C) (03/24 0811) Pulse Rate:  [76-101] 98 (03/24 0811) Cardiac Rhythm: Normal sinus rhythm (03/24 0700) Resp:  [17-24] 23 (03/24 0811) BP: (117-150)/(54-79) 146/65 (03/24 0811) SpO2:  [99 %-100 %] 100 % (03/24 0811)  Intake/Output from previous day: 03/23 0701 - 03/24 0700 In: 388 [P.O.:358; I.V.:30] Out: 1830 [Urine:1750; Chest Tube:80]  General appearance: alert, cooperative and no distress Heart: regular rate and rhythm and tachy Lungs: clear to auscultation bilaterally Abdomen: soft, non-tender; bowel sounds normal; no masses,  no organomegaly Extremities: extremities normal, atraumatic, no cyanosis or edema Wound: clean and dry  Lab Results: Recent Labs    06/09/17 0445 06/10/17 0530  WBC 5.1 6.1  HGB 7.1* 7.1*  HCT 21.1* 21.6*  PLT 246 299   BMET:  Recent Labs    06/09/17 0445 06/10/17 0530  NA 136 139  K 3.7 3.7  CL 105 104  CO2 22 24  GLUCOSE 73 85  BUN 28* 23*  CREATININE 1.19* 1.11*  CALCIUM 6.2* 6.7*    PT/INR: No results for input(s): LABPROT, INR in the last 72 hours. ABG    Component Value Date/Time   PHART 7.460 (H) 06/05/2017 0510   HCO3 26.2 06/05/2017 0510   O2SAT 95.1 06/05/2017 0510   CBG (last 3)  Recent Labs    06/09/17 1700  06/09/17 2115 06/10/17 0815  GLUCAP 170* 154* 88    Assessment/Plan: S/P Procedure(s) (LRB): VIDEO ASSISTED THORACOSCOPY (VATS)/LEFT UPPER LOBECTOMY (Left) VIDEO BRONCHOSCOPY WITH ENDOBRONCHIAL ULTRASOUND (N/A)  1. Chest tube- 1+ air leak, CXR shows stable appearance of apical space, significant improvement of atelectasis on left-- continue chest tube to suction 2. Pulm- no acute issues, continue aggressive pulmonary toilet 3. CV- Sinus Tachycardia, HTN- continue Lopressor, Norvasc 4. Hypomagnesemia- resolved, level is now at 2.3 5. Hypocalcemia- some improvement, level is up to 6.7, will continue supplementation 6. Chronic Anemia- stable at 7.1, patient again states she would not like a transfusion 7. dispo- patient stable, 1+ air leak with cough leave chest tube on suction, continue to supplement calcium, repeat cXR in AM   LOS: 6 days    Ellwood Handler 06/10/2017  I have seen and examined the patient and agree with the assessment and plan as outlined.  Looks and feels much better today.  Will place chest tube back to water seal - possibly d/c tube tomorrow.  Rexene Alberts, MD 06/10/2017 10:41 AM

## 2017-06-11 ENCOUNTER — Ambulatory Visit: Payer: Medicare Other

## 2017-06-11 ENCOUNTER — Inpatient Hospital Stay (HOSPITAL_COMMUNITY): Payer: Medicare Other

## 2017-06-11 LAB — BASIC METABOLIC PANEL
Anion gap: 10 (ref 5–15)
BUN: 22 mg/dL — ABNORMAL HIGH (ref 6–20)
CO2: 22 mmol/L (ref 22–32)
Calcium: 6.8 mg/dL — ABNORMAL LOW (ref 8.9–10.3)
Chloride: 100 mmol/L — ABNORMAL LOW (ref 101–111)
Creatinine, Ser: 1.1 mg/dL — ABNORMAL HIGH (ref 0.44–1.00)
GFR calc Af Amer: 56 mL/min — ABNORMAL LOW (ref >60)
GFR calc non Af Amer: 48 mL/min — ABNORMAL LOW (ref >60)
Glucose, Bld: 177 mg/dL — ABNORMAL HIGH (ref 65–99)
Potassium: 4 mmol/L (ref 3.5–5.1)
Sodium: 132 mmol/L — ABNORMAL LOW (ref 135–145)

## 2017-06-11 LAB — GLUCOSE, CAPILLARY
Glucose-Capillary: 62 mg/dL — ABNORMAL LOW (ref 65–99)
Glucose-Capillary: 123 mg/dL — ABNORMAL HIGH (ref 65–99)
Glucose-Capillary: 168 mg/dL — ABNORMAL HIGH (ref 65–99)
Glucose-Capillary: 94 mg/dL (ref 65–99)

## 2017-06-11 MED ORDER — SODIUM CHLORIDE 0.9% FLUSH: 10.0000 mL | INTRAVENOUS | Status: DC | PRN

## 2017-06-11 MED ORDER — ACETAMINOPHEN 500 MG PO TABS
1000.0000 mg | ORAL_TABLET | Freq: Four times a day (QID) | ORAL | Status: DC | PRN
  Administered 2017-06-11: 1000 mg via ORAL
  Filled 2017-06-11: qty 2

## 2017-06-11 NOTE — Progress Notes (Signed)
7 Days Post-Op Procedure(s) (LRB): VIDEO ASSISTED THORACOSCOPY (VATS)/LEFT UPPER LOBECTOMY (Left) VIDEO BRONCHOSCOPY WITH ENDOBRONCHIAL ULTRASOUND (N/A) Subjective: I'm getting better   Objective: Vital signs in last 24 hours: Temp:  [97.4 F (36.3 C)-99.8 F (37.7 C)] 99.8 F (37.7 C) (03/25 0700) Pulse Rate:  [77-98] 88 (03/25 0700) Cardiac Rhythm: Normal sinus rhythm (03/25 0702) Resp:  [16-23] 16 (03/25 0700) BP: (118-160)/(50-67) 153/66 (03/25 0700) SpO2:  [97 %-100 %] 97 % (03/25 0700)  Hemodynamic parameters for last 24 hours:    Intake/Output from previous day: 03/24 0701 - 03/25 0700 In: -  Out: 1212 [Urine:1150; Chest Tube:62] Intake/Output this shift: No intake/output data recorded.  General appearance: alert, cooperative and no distress Neurologic: intact Heart: regular rate and rhythm Lungs: diminished breath sounds on left Wound: clean and dry minimal air leak  Lab Results: Recent Labs    06/09/17 0445 06/10/17 0530  WBC 5.1 6.1  HGB 7.1* 7.1*  HCT 21.1* 21.6*  PLT 246 299   BMET:  Recent Labs    06/09/17 0445 06/10/17 0530  NA 136 139  K 3.7 3.7  CL 105 104  CO2 22 24  GLUCOSE 73 85  BUN 28* 23*  CREATININE 1.19* 1.11*  CALCIUM 6.2* 6.7*    PT/INR: No results for input(s): LABPROT, INR in the last 72 hours. ABG    Component Value Date/Time   PHART 7.460 (H) 06/05/2017 0510   HCO3 26.2 06/05/2017 0510   O2SAT 95.1 06/05/2017 0510   CBG (last 3)  Recent Labs    06/10/17 1210 06/10/17 1644 06/10/17 2130  GLUCAP 203* 151* 108*    Assessment/Plan: S/P Procedure(s) (LRB): VIDEO ASSISTED THORACOSCOPY (VATS)/LEFT UPPER LOBECTOMY (Left) VIDEO BRONCHOSCOPY WITH ENDOBRONCHIAL ULTRASOUND (N/A) -CV- in SR- BP elevated this AM, hasn't taken metoprolol yet  No further VT  ECHO- EF 55%  RESP- has a very small air leak- keep tube on water seal  Continue IS, flutter, bronchodilators  RENAL- creatinine OK yesterday, repeat labs in  AM  ENDO- CBG mildly elevated, continue SSI  Enoxaparin for DVT prophylaxis  DC central line   LOS: 7 days    [name] 06/11/2017

## 2017-06-12 ENCOUNTER — Inpatient Hospital Stay (HOSPITAL_COMMUNITY): Payer: Medicare Other

## 2017-06-12 LAB — CBC
HCT: 21.8 % — ABNORMAL LOW (ref 36.0–46.0)
Hemoglobin: 7 g/dL — ABNORMAL LOW (ref 12.0–15.0)
MCH: 27.3 pg (ref 26.0–34.0)
MCHC: 32.1 g/dL (ref 30.0–36.0)
MCV: 85.2 fL (ref 78.0–100.0)
Platelets: 363 10*3/uL (ref 150–400)
RBC: 2.56 MIL/uL — ABNORMAL LOW (ref 3.87–5.11)
RDW: 14.9 % (ref 11.5–15.5)
WBC: 7.4 10*3/uL (ref 4.0–10.5)

## 2017-06-12 LAB — COMPREHENSIVE METABOLIC PANEL
ALT: 43 U/L (ref 14–54)
AST: 49 U/L — ABNORMAL HIGH (ref 15–41)
Albumin: 2.6 g/dL — ABNORMAL LOW (ref 3.5–5.0)
Alkaline Phosphatase: 60 U/L (ref 38–126)
Anion gap: 10 (ref 5–15)
BUN: 20 mg/dL (ref 6–20)
CO2: 23 mmol/L (ref 22–32)
Calcium: 7.1 mg/dL — ABNORMAL LOW (ref 8.9–10.3)
Chloride: 104 mmol/L (ref 101–111)
Creatinine, Ser: 1.11 mg/dL — ABNORMAL HIGH (ref 0.44–1.00)
GFR calc Af Amer: 55 mL/min — ABNORMAL LOW (ref >60)
GFR calc non Af Amer: 48 mL/min — ABNORMAL LOW (ref >60)
Glucose, Bld: 55 mg/dL — ABNORMAL LOW (ref 65–99)
Potassium: 3.8 mmol/L (ref 3.5–5.1)
Sodium: 137 mmol/L (ref 135–145)
Total Bilirubin: 0.4 mg/dL (ref 0.3–1.2)
Total Protein: 5.7 g/dL — ABNORMAL LOW (ref 6.5–8.1)

## 2017-06-12 LAB — GLUCOSE, CAPILLARY
Glucose-Capillary: 62 mg/dL — ABNORMAL LOW (ref 65–99)
Glucose-Capillary: 200 mg/dL — ABNORMAL HIGH (ref 65–99)
Glucose-Capillary: 100 mg/dL — ABNORMAL HIGH (ref 65–99)
Glucose-Capillary: 211 mg/dL — ABNORMAL HIGH (ref 65–99)

## 2017-06-12 NOTE — Care Management Note (Addendum)
Case Management Note  Patient Details  Name: ZYIA KANEKO MRN: [id number] Date of Birth: 10/24/1940  Subjective/Objective:    Pt is s/p VATS and lobectomy                Action/Plan:  PTA independent from home alone - however daughter will stay with pt 24/7 post discharge.  Pt has PCP and denied barriers for obtaining medications as prescribed.  HH recommended - CM offered agency choice for both HH and DME - pt chose [facility] - CM requested orders from attending and will arrange [facility] once orders are received.     Expected Discharge Date:                  Expected Discharge Plan:  [facility](from home)  In-House Referral:     Discharge planning Services  CM Consult  Post Acute Care Choice:    Choice offered to:     DME Arranged:    DME Agency:     HH Arranged:    HH Agency:     Status of Service:     If discussed at [employer], dates discussed:    Additional Comments: 06/13/2017 CM contacted [facility] and provided referral - referral accepted Maryclare Labrador, RN 06/12/2017, 3:53 PM

## 2017-06-12 NOTE — Progress Notes (Signed)
      [address]       [place] [address]             [phone]      8 Days Post-Op Procedure(s) (LRB): VIDEO ASSISTED THORACOSCOPY (VATS)/LEFT UPPER LOBECTOMY (Left) VIDEO BRONCHOSCOPY WITH ENDOBRONCHIAL ULTRASOUND (N/A) Subjective: No issues. She is going to walk today. Pain well controlled.   Objective: Vital signs in last 24 hours: Temp:  [97.9 F (36.6 C)-99 F (37.2 C)] 97.9 F (36.6 C) (03/26 0320) Pulse Rate:  [71-91] 78 (03/26 0320) Cardiac Rhythm: Ventricular tachycardia (03/25 2250) Resp:  [17-24] 17 (03/26 0320) BP: (148-156)/(58-91) 156/62 (03/26 0320) SpO2:  [92 %-100 %] 99 % (03/26 0320)     Intake/Output from previous day: 03/25 0701 - 03/26 0700 In: 657 [P.O.:637; I.V.:20] Out: 1045 [Urine:800; Chest Tube:245] Intake/Output this shift: No intake/output data recorded.  General appearance: alert, cooperative and no distress Heart: regular rate and rhythm, S1, S2 normal, no murmur, click, rub or gallop Lungs: clear to auscultation bilaterally and wheezing in all fields Abdomen: soft, non-tender; bowel sounds normal; no masses,  no organomegaly Extremities: extremities normal, atraumatic, no cyanosis or edema Wound: clean and dry  Lab Results: Recent Labs    06/10/17 0530 06/12/17 0558  WBC 6.1 7.4  HGB 7.1* 7.0*  HCT 21.6* 21.8*  PLT 299 363   BMET:  Recent Labs    06/11/17 0500 06/12/17 0558  NA 132* 137  K 4.0 3.8  CL 100* 104  CO2 22 23  GLUCOSE 177* 55*  BUN 22* 20  CREATININE 1.10* 1.11*  CALCIUM 6.8* 7.1*    PT/INR: No results for input(s): LABPROT, INR in the last 72 hours. ABG    Component Value Date/Time   PHART 7.460 (H) 06/05/2017 0510   HCO3 26.2 06/05/2017 0510   O2SAT 95.1 06/05/2017 0510   CBG (last 3)  Recent Labs    06/11/17 0901 06/11/17 1701 06/11/17 2115  GLUCAP 123* 168* 94    Assessment/Plan: S/P Procedure(s) (LRB): VIDEO ASSISTED THORACOSCOPY (VATS)/LEFT UPPER LOBECTOMY  (Left) VIDEO BRONCHOSCOPY WITH ENDOBRONCHIAL ULTRASOUND (N/A)  1. CV-BP slightly elevated this morning. On home medications. HR is NSR in the 70s.  2. Pulm-small air leak remains. CT on water seal.  3. Renal-creatinine 1.11. Electrolytes okay 4. H and H 7.0/21.8, no symptoms 5. Endo-blood glucose level well controlled 6. Continue Lovenox for DVT prophylaxis.   Plan: continued chest tube for now. CXR in the AM.    LOS: 8 days    Elgie Collard 06/12/2017

## 2017-06-12 NOTE — Evaluation (Signed)
Physical Therapy Evaluation Patient Details Name: Karen West MRN: [id number] DOB: 1940-07-09 Today's Date: 06/12/2017   History of Present Illness  Pt is a 77 y.o. female with non-small cell carcinoma admitted 06/04/17 now s/p VATS and L upper lobectomy. PMH includes multinodular goiter, HTN, left mastectomy for breast CA, OSA, DMII, aortic atherosclerosis, CAD, and right bundle branch block.    Clinical Impression  Pt presents with an overall decrease in functional mobility secondary to above. PTA, pt indep and lives alone; daughter plans to stay with pt at d/c to provide initial 24/7 support. Today, pt able to amb 260' with RW and supervision for safety; required 3x standing rest breaks secondary to DOE. Educ on energy conservation, fall risk reduction, and importance of continued mobility. Pt consistently using flutter valve to "help with" her SOB. Pt would benefit from continued acute PT services to maximize functional mobility and independence prior to d/c with HHPT services.     Follow Up Recommendations Home health PT;Supervision/Assistance - 24 hour    Equipment Recommendations  Rolling walker with 5" wheels    Recommendations for Other Services       Precautions / Restrictions Precautions Precautions: Fall;Other (comment) Precaution Comments: Chest tube Restrictions Weight Bearing Restrictions: No      Mobility  Bed Mobility               General bed mobility comments: Received sitting in recliner  Transfers Overall transfer level: Needs assistance Equipment used: None Transfers: Sit to/from Stand Sit to Stand: Supervision            Ambulation/Gait Ambulation/Gait assistance: Supervision Ambulation Distance (Feet): 260 Feet Assistive device: Rolling walker (2 wheeled);None Gait Pattern/deviations: Step-through pattern;Decreased stride length;Trunk flexed Gait velocity: Decreased Gait velocity interpretation: <1.8 ft/sec, indicative of risk for  recurrent falls General Gait Details: Amb in room with no UE support, but SOB. Recommended use of RW for energy conservation and added stability. Amb an additional 75' with RW and supervision for safety; assist to carry lines/chest tube  Stairs            Wheelchair Mobility    Modified Rankin (Stroke Patients Only)       Balance Overall balance assessment: Needs assistance   Sitting balance-Leahy Scale: Good Sitting balance - Comments: Indep to don socks sitting at edge of chair   Standing balance support: No upper extremity supported;Bilateral upper extremity supported Standing balance-Leahy Scale: Fair                               Pertinent Vitals/Pain Pain Assessment: No/denies pain    Home Living Family/patient expects to be discharged to:: Private residence Living Arrangements: Alone Available Help at Discharge: Family;Available 24 hours/day Type of Home: House Home Access: Stairs to enter Entrance Stairs-Rails: None Entrance Stairs-Number of Steps: 1 Home Layout: One level Home Equipment: Walker - 2 wheels;Bedside commode Additional Comments: Lives alone. Daughter plans to d/c home with pt to provide initial 24/7 support    Prior Function Level of Independence: Independent               Hand Dominance        Extremity/Trunk Assessment   Upper Extremity Assessment Upper Extremity Assessment: Overall WFL for tasks assessed    Lower Extremity Assessment Lower Extremity Assessment: Overall WFL for tasks assessed       Communication   Communication: No difficulties  Cognition Arousal/Alertness: Awake/alert Behavior During  Therapy: WFL for tasks assessed/performed Overall Cognitive Status: Within Functional Limits for tasks assessed                                        General Comments General comments (skin integrity, edema, etc.): Resting/seated BP 150/97, post-amb BP 154/65    Exercises      Assessment/Plan    PT Assessment Patient needs continued PT services  PT Problem List         PT Treatment Interventions DME instruction;Gait training;Stair training;Functional mobility training;Therapeutic activities;Therapeutic exercise;Balance training;Patient/family education    PT Goals (Current goals can be found in the Care Plan section)  Acute Rehab PT Goals Patient Stated Goal: Return home PT Goal Formulation: With patient Time For Goal Achievement: 06/26/17 Potential to Achieve Goals: Good    Frequency Min 3X/week   Barriers to discharge        Co-evaluation               AM-PAC PT "6 Clicks" Daily Activity  Outcome Measure Difficulty turning over in bed (including adjusting bedclothes, sheets and blankets)?: A Little Difficulty moving from lying on back to sitting on the side of the bed? : A Little Difficulty sitting down on and standing up from a chair with arms (e.g., wheelchair, bedside commode, etc,.)?: A Little Help needed moving to and from a bed to chair (including a wheelchair)?: A Little Help needed walking in hospital room?: A Little Help needed climbing 3-5 steps with a railing? : A Little 6 Click Score: 18    End of Session Equipment Utilized During Treatment: Gait belt Activity Tolerance: Patient tolerated treatment well Patient left: in chair;with call bell/phone within reach Nurse Communication: Mobility status PT Visit Diagnosis: Other abnormalities of gait and mobility (R26.89)    Time: 8453-6468 PT Time Calculation (min) (ACUTE ONLY): 32 min   Charges:   PT Evaluation $PT Eval Moderate Complexity: 1 Mod PT Treatments $Gait Training: 8-22 mins   PT G Codes:       Mabeline Caras, PT, DPT Acute Rehab Services  Pager: [place] 06/12/2017, 11:07 AM

## 2017-06-13 ENCOUNTER — Inpatient Hospital Stay (HOSPITAL_COMMUNITY): Payer: Medicare Other

## 2017-06-13 LAB — GLUCOSE, CAPILLARY
Glucose-Capillary: 91 mg/dL (ref 65–99)
Glucose-Capillary: 261 mg/dL — ABNORMAL HIGH (ref 65–99)
Glucose-Capillary: 117 mg/dL — ABNORMAL HIGH (ref 65–99)
Glucose-Capillary: 190 mg/dL — ABNORMAL HIGH (ref 65–99)

## 2017-06-13 NOTE — Progress Notes (Signed)
Physical Therapy Treatment & Discharge Patient Details Name: Karen West MRN: [id number] DOB: January 09, 1941 Today's Date: 06/13/2017    History of Present Illness Pt is a 77 y.o. female with non-small cell carcinoma admitted 06/04/17 now s/p VATS and L upper lobectomy. PMH includes multinodular goiter, HTN, left mastectomy for breast CA, OSA, DMII, aortic atherosclerosis, CAD, and right bundle branch block.   PT Comments    Pt progressing well with mobility. Able to ambulate with RW and supervision for safety. Anxious to have chest tube removed and return home. From a mobility perspective, feel pt is safe to return home with initial use of RW, 24/7 support for daughter, and HHPT services. Pt has no further questions or concerns. Encouraged to continue ambulating with RN/NT staff. D/c acute PT.   Follow Up Recommendations  Home health PT;Supervision/Assistance - 24 hour     Equipment Recommendations  Rolling walker with 5" wheels    Recommendations for Other Services       Precautions / Restrictions Precautions Precautions: Fall;Other (comment) Precaution Comments: Chest tube Restrictions Weight Bearing Restrictions: No    Mobility  Bed Mobility               General bed mobility comments: Received sitting in recliner  Transfers Overall transfer level: Modified independent Equipment used: Rolling walker (2 wheeled) Transfers: Sit to/from Stand              Ambulation/Gait Ambulation/Gait assistance: Supervision Ambulation Distance (Feet): 250 Feet Assistive device: Rolling walker (2 wheeled) Gait Pattern/deviations: Step-through pattern;Decreased stride length;Trunk flexed Gait velocity: Decreased Gait velocity interpretation: <1.8 ft/sec, indicative of risk for recurrent falls General Gait Details: Supervision with RW. 2x standing rest break secondary to DOE. Encouraged initial use of RW at home for stability and energy conservation   Stairs             Wheelchair Mobility    Modified Rankin (Stroke Patients Only)       Balance Overall balance assessment: Needs assistance   Sitting balance-Leahy Scale: Good     Standing balance support: No upper extremity supported;Bilateral upper extremity supported Standing balance-Leahy Scale: Fair                              Cognition Arousal/Alertness: Awake/alert Behavior During Therapy: WFL for tasks assessed/performed Overall Cognitive Status: Within Functional Limits for tasks assessed                                        Exercises      General Comments        Pertinent Vitals/Pain Pain Assessment: No/denies pain    Home Living                      Prior Function            PT Goals (current goals can now be found in the care plan section) Acute Rehab PT Goals Patient Stated Goal: Return home PT Goal Formulation: With patient Time For Goal Achievement: 06/26/17 Potential to Achieve Goals: Good Progress towards PT goals: Goals met/education completed, patient discharged from PT    Frequency    Min 3X/week      PT Plan Current plan remains appropriate    Co-evaluation  AM-PAC PT "6 Clicks" Daily Activity  Outcome Measure  Difficulty turning over in bed (including adjusting bedclothes, sheets and blankets)?: None Difficulty moving from lying on back to sitting on the side of the bed? : None Difficulty sitting down on and standing up from a chair with arms (e.g., wheelchair, bedside commode, etc,.)?: None Help needed moving to and from a bed to chair (including a wheelchair)?: A Little Help needed walking in hospital room?: A Little Help needed climbing 3-5 steps with a railing? : A Little 6 Click Score: 21    End of Session Equipment Utilized During Treatment: Gait belt Activity Tolerance: Patient tolerated treatment well Patient left: in chair;with call bell/phone within reach Nurse  Communication: Mobility status PT Visit Diagnosis: Other abnormalities of gait and mobility (R26.89)     Time: 1046-1101 PT Time Calculation (min) (ACUTE ONLY): 15 min  Charges:  $Therapeutic Activity: 8-22 mins                    G Codes:      Mabeline Caras, PT, DPT Acute Rehab Services  Pager: [place] 06/13/2017, 11:16 AM

## 2017-06-13 NOTE — Care Management Important Message (Signed)
Important Message  Patient Details  Name: Karen West MRN: [id number] Date of Birth: Dec 08, 1940   Medicare Important Message Given:  Yes    Barb Merino Debra Colon 06/13/2017, 2:24 PM

## 2017-06-13 NOTE — Progress Notes (Signed)
9 Days Post-Op Procedure(s) (LRB): VIDEO ASSISTED THORACOSCOPY (VATS)/LEFT UPPER LOBECTOMY (Left) VIDEO BRONCHOSCOPY WITH ENDOBRONCHIAL ULTRASOUND (N/A) Subjective: No complaints this Am  Objective: Vital signs in last 24 hours: Temp:  [97.5 F (36.4 C)-98.3 F (36.8 C)] 98.2 F (36.8 C) (03/27 0739) Pulse Rate:  [75-94] 81 (03/27 0739) Cardiac Rhythm: Normal sinus rhythm (03/27 0700) Resp:  [15-26] 17 (03/27 0739) BP: (155-194)/(49-71) 162/54 (03/27 0739) SpO2:  [98 %-100 %] 98 % (03/27 0739)  Hemodynamic parameters for last 24 hours:    Intake/Output from previous day: 03/26 0701 - 03/27 0700 In: 1200 [P.O.:1200] Out: 925 [Urine:800; Chest Tube:125] Intake/Output this shift: No intake/output data recorded.  General appearance: alert, cooperative and no distress Neurologic: intact Heart: regular rate and rhythm Lungs: diminished breath sounds LLL and left base and faint wheezes bilaterally no air leak  Lab Results: Recent Labs    06/12/17 0558  WBC 7.4  HGB 7.0*  HCT 21.8*  PLT 363   BMET:  Recent Labs    06/11/17 0500 06/12/17 0558  NA 132* 137  K 4.0 3.8  CL 100* 104  CO2 22 23  GLUCOSE 177* 55*  BUN 22* 20  CREATININE 1.10* 1.11*  CALCIUM 6.8* 7.1*    PT/INR: No results for input(s): LABPROT, INR in the last 72 hours. ABG    Component Value Date/Time   PHART 7.460 (H) 06/05/2017 0510   HCO3 26.2 06/05/2017 0510   O2SAT 95.1 06/05/2017 0510   CBG (last 3)  Recent Labs    06/12/17 1635 06/12/17 2125 06/13/17 0741  GLUCAP 100* 211* 91    Assessment/Plan: S/P Procedure(s) (LRB): VIDEO ASSISTED THORACOSCOPY (VATS)/LEFT UPPER LOBECTOMY (Left) VIDEO BRONCHOSCOPY WITH ENDOBRONCHIAL ULTRASOUND (N/A) -No air leak this Am- check CXR- if OK will dc CT Continue ambulation Arrangements for [facility] No further arrhythmias Recheck labs tomorrow Possibly home tomorrow   LOS: 9 days    Karen West 06/13/2017

## 2017-06-14 ENCOUNTER — Other Ambulatory Visit: Payer: Self-pay | Admitting: *Deleted

## 2017-06-14 ENCOUNTER — Inpatient Hospital Stay (HOSPITAL_COMMUNITY): Payer: Medicare Other

## 2017-06-14 LAB — GLUCOSE, CAPILLARY
Glucose-Capillary: 113 mg/dL — ABNORMAL HIGH (ref 65–99)
Glucose-Capillary: 194 mg/dL — ABNORMAL HIGH (ref 65–99)
Glucose-Capillary: 119 mg/dL — ABNORMAL HIGH (ref 65–99)
Glucose-Capillary: 156 mg/dL — ABNORMAL HIGH (ref 65–99)

## 2017-06-14 LAB — COMPREHENSIVE METABOLIC PANEL
ALT: 98 U/L — ABNORMAL HIGH (ref 14–54)
AST: 86 U/L — ABNORMAL HIGH (ref 15–41)
Albumin: 2.7 g/dL — ABNORMAL LOW (ref 3.5–5.0)
Alkaline Phosphatase: 68 U/L (ref 38–126)
Anion gap: 12 (ref 5–15)
BUN: 18 mg/dL (ref 6–20)
CO2: 22 mmol/L (ref 22–32)
Calcium: 8.1 mg/dL — ABNORMAL LOW (ref 8.9–10.3)
Chloride: 105 mmol/L (ref 101–111)
Creatinine, Ser: 1.06 mg/dL — ABNORMAL HIGH (ref 0.44–1.00)
GFR calc Af Amer: 58 mL/min — ABNORMAL LOW (ref >60)
GFR calc non Af Amer: 50 mL/min — ABNORMAL LOW (ref >60)
Glucose, Bld: 114 mg/dL — ABNORMAL HIGH (ref 65–99)
Potassium: 4.2 mmol/L (ref 3.5–5.1)
Sodium: 139 mmol/L (ref 135–145)
Total Bilirubin: 0.4 mg/dL (ref 0.3–1.2)
Total Protein: 5.8 g/dL — ABNORMAL LOW (ref 6.5–8.1)

## 2017-06-14 LAB — MAGNESIUM: Magnesium: 1.4 mg/dL — ABNORMAL LOW (ref 1.7–2.4)

## 2017-06-14 MED ORDER — MAGNESIUM OXIDE 400 (241.3 MG) MG PO TABS
800.0000 mg | ORAL_TABLET | Freq: Two times a day (BID) | ORAL | Status: DC
  Administered 2017-06-14 – 2017-06-15 (×3): 800 mg via ORAL
  Filled 2017-06-14 (×3): qty 2

## 2017-06-14 MED ORDER — TRIAMTERENE-HCTZ 37.5-25 MG PO TABS
1.0000 | ORAL_TABLET | Freq: Every day | ORAL | Status: DC
  Administered 2017-06-14 – 2017-06-15 (×2): 1 via ORAL
  Filled 2017-06-14 (×2): qty 1

## 2017-06-14 NOTE — Progress Notes (Signed)
10 Days Post-Op Procedure(s) (LRB): VIDEO ASSISTED THORACOSCOPY (VATS)/LEFT UPPER LOBECTOMY (Left) VIDEO BRONCHOSCOPY WITH ENDOBRONCHIAL ULTRASOUND (N/A) Subjective: Feels better, wants to go home  Objective: Vital signs in last 24 hours: Temp:  [97.5 F (36.4 C)-99.2 F (37.3 C)] 98.1 F (36.7 C) (03/28 0810) Pulse Rate:  [77-99] 77 (03/28 0454) Cardiac Rhythm: Normal sinus rhythm (03/28 0701) Resp:  [18-23] 18 (03/28 0810) BP: (152-173)/(55-88) 158/59 (03/28 0810) SpO2:  [98 %-100 %] 99 % (03/28 0810)  Hemodynamic parameters for last 24 hours:    Intake/Output from previous day: 03/27 0701 - 03/28 0700 In: 730 [P.O.:720; I.V.:10] Out: 2400 [Urine:2400] Intake/Output this shift: No intake/output data recorded.  General appearance: alert Neurologic: intact Heart: regular rate and rhythm Lungs: diminished breath sounds left base Abdomen: normal findings: soft, non-tender  Lab Results: Recent Labs    06/12/17 0558  WBC 7.4  HGB 7.0*  HCT 21.8*  PLT 363   BMET:  Recent Labs    06/12/17 0558 06/14/17 0601  NA 137 139  K 3.8 4.2  CL 104 105  CO2 23 22  GLUCOSE 55* 114*  BUN 20 18  CREATININE 1.11* 1.06*  CALCIUM 7.1* 8.1*    PT/INR: No results for input(s): LABPROT, INR in the last 72 hours. ABG    Component Value Date/Time   PHART 7.460 (H) 06/05/2017 0510   HCO3 26.2 06/05/2017 0510   O2SAT 95.1 06/05/2017 0510   CBG (last 3)  Recent Labs    06/13/17 1642 06/13/17 2109 06/14/17 0824  GLUCAP 117* 190* 113*    Assessment/Plan: S/P Procedure(s) (LRB): VIDEO ASSISTED THORACOSCOPY (VATS)/LEFT UPPER LOBECTOMY (Left) VIDEO BRONCHOSCOPY WITH ENDOBRONCHIAL ULTRASOUND (N/A) -Overall doing well No further VT.  Hypertension- will increase Maxzide to 1 tablet daily Hypomagnesemia- increase mag oxide to 800 mg BID Hypocalcemia- improved, still low but expected with low albumin Continue glipizide, metformin, SSI for type II DM CXR today shows a small  left pneumothorax/ space- asymptomatic- will repeat CXR in AM   LOS: 10 days    [name] 06/14/2017

## 2017-06-15 ENCOUNTER — Inpatient Hospital Stay (HOSPITAL_COMMUNITY): Payer: Medicare Other

## 2017-06-15 LAB — GLUCOSE, CAPILLARY
Glucose-Capillary: 90 mg/dL (ref 65–99)
Glucose-Capillary: 244 mg/dL — ABNORMAL HIGH (ref 65–99)

## 2017-06-15 MED ORDER — OXYCODONE HCL 5 MG PO TABS: 5.0000 mg | ORAL_TABLET | Freq: Four times a day (QID) | ORAL | 0 refills | Status: DC | PRN

## 2017-06-15 MED ORDER — METOPROLOL TARTRATE 25 MG PO TABS: 25.0000 mg | ORAL_TABLET | Freq: Two times a day (BID) | ORAL | 1 refills | Status: DC

## 2017-06-15 MED ORDER — CALCIUM CARBONATE 1250 (500 CA) MG PO TABS: 1.0000 | ORAL_TABLET | Freq: Three times a day (TID) | ORAL | 1 refills | Status: DC

## 2017-06-15 MED ORDER — TRIAMTERENE-HCTZ 37.5-25 MG PO TABS: 1.0000 | ORAL_TABLET | Freq: Every day | ORAL | 1 refills | Status: DC

## 2017-06-15 MED ORDER — MAGNESIUM OXIDE 400 (241.3 MG) MG PO TABS: 800.0000 mg | ORAL_TABLET | Freq: Two times a day (BID) | ORAL | 1 refills | Status: DC

## 2017-06-15 NOTE — Progress Notes (Signed)
      [address]       [place] [address]             [phone]      11 Days Post-Op Procedure(s) (LRB): VIDEO ASSISTED THORACOSCOPY (VATS)/LEFT UPPER LOBECTOMY (Left) VIDEO BRONCHOSCOPY WITH ENDOBRONCHIAL ULTRASOUND (N/A) Subjective: No issues overnight.  Objective: Vital signs in last 24 hours: Temp:  [97.8 F (36.6 C)-98.6 F (37 C)] 98.6 F (37 C) (03/29 0745) Pulse Rate:  [74-88] 81 (03/29 0745) Cardiac Rhythm: Normal sinus rhythm (03/29 0400) Resp:  [16-25] 16 (03/29 0745) BP: (153-188)/(59-85) 174/68 (03/29 0745) SpO2:  [96 %-99 %] 96 % (03/29 0745)     Intake/Output from previous day: 03/28 0701 - 03/29 0700 In: 420 [P.O.:400; I.V.:20] Out: 2000 [Urine:2000] Intake/Output this shift: No intake/output data recorded.  General appearance: alert, cooperative and no distress Heart: regular rate and rhythm, S1, S2 normal, no murmur, click, rub or gallop Lungs: clear to auscultation bilaterally and diminshed breath sounds in the lower lobes Abdomen: soft, non-tender; bowel sounds normal; no masses,  no organomegaly Extremities: extremities normal, atraumatic, no cyanosis or edema Wound: clean and dry  Lab Results: No results for input(s): WBC, HGB, HCT, PLT in the last 72 hours. BMET:  Recent Labs    06/14/17 0601  NA 139  K 4.2  CL 105  CO2 22  GLUCOSE 114*  BUN 18  CREATININE 1.06*  CALCIUM 8.1*    PT/INR: No results for input(s): LABPROT, INR in the last 72 hours. ABG    Component Value Date/Time   PHART 7.460 (H) 06/05/2017 0510   HCO3 26.2 06/05/2017 0510   O2SAT 95.1 06/05/2017 0510   CBG (last 3)  Recent Labs    06/14/17 1626 06/14/17 2148 06/15/17 0810  GLUCAP 119* 156* 90    Assessment/Plan: S/P Procedure(s) (LRB): VIDEO ASSISTED THORACOSCOPY (VATS)/LEFT UPPER LOBECTOMY (Left) VIDEO BRONCHOSCOPY WITH ENDOBRONCHIAL ULTRASOUND (N/A)  1. Hypertension-has not received morning meds. Taking Norvasc 10mg  daily and  Maxzide increased yesterday. Will monitor closely. May require additional medication. NSR in the 80s. EKG reviewed.  2. CXR this morning appears stable. Small left pneumo on CXR yesterday.  3. Renal-creatinine 1.06, potassium okay. Magnesium supplementation increased.  4. H and H has been stable.  5. Continue glipizide, metformin, and SSI. Blood glucose level well controlled.  6. Continue Lovenox for DVT prophylaxis.   Plan: Possibly home today.    LOS: 11 days    Elgie Collard 06/15/2017

## 2017-06-15 NOTE — Care Management Note (Signed)
Case Management Note  Patient Details  Name: Karen West MRN: [id number] Date of Birth: 12-13-1940  Subjective/Objective:    Pt is s/p VATS and lobectomy                Action/Plan:  PTA independent from home alone - however daughter will stay with pt 24/7 post discharge.  Pt has PCP and denied barriers for obtaining medications as prescribed.  HH recommended - CM offered agency choice for both HH and DME - pt chose [facility] - CM requested orders from attending and will arrange [facility] once orders are received.     Expected Discharge Date:  06/15/17               Expected Discharge Plan:  [facility] [place] [facility](from home)  In-House Referral:     Discharge planning Services  CM Consult  Post Acute Care Choice:    Choice offered to:  Patient  DME Arranged:  [facility] Agency:  [place] Arranged:  RN, PT [facility] Agency:  [facility] [facility]  Status of Service:  Completed, signed off  If discussed at [place] of Stay Meetings, dates discussed:    Additional Comments: 06/15/2017 CM contacted [facility] and provided referral - referral accepted Maryclare Labrador, RN 06/15/2017, 2:00 PM

## 2017-06-15 NOTE — Progress Notes (Signed)
VORBV per [place], [place]. Discharge patient. CXR reviewed by Dr. Roxan Hockey.  Delay in DC due to patient awaiting family for ride. States family will be here around 3pm. Pt resting comfortably in bed. Nursing care assumed by [facility]. VSS.

## 2017-06-15 NOTE — Progress Notes (Signed)
Removed mid line on [address]. Pt received discharge instructions with prescriptions. Pt understood it well. Pt's daughter pick her up and took her all belongings. HS [employer]

## 2017-06-19 ENCOUNTER — Encounter: Payer: Self-pay | Admitting: Sports Medicine

## 2017-06-19 ENCOUNTER — Ambulatory Visit (INDEPENDENT_AMBULATORY_CARE_PROVIDER_SITE_OTHER): Payer: Medicare Other | Admitting: Sports Medicine

## 2017-06-19 VITALS — BP 119/66 | HR 86 | Resp 16

## 2017-06-19 DIAGNOSIS — Z09 Encounter for follow-up examination after completed treatment for conditions other than malignant neoplasm: Secondary | ICD-10-CM | POA: Diagnosis not present

## 2017-06-19 DIAGNOSIS — E119 Type 2 diabetes mellitus without complications: Secondary | ICD-10-CM | POA: Diagnosis not present

## 2017-06-19 DIAGNOSIS — M79674 Pain in right toe(s): Secondary | ICD-10-CM

## 2017-06-19 DIAGNOSIS — B351 Tinea unguium: Secondary | ICD-10-CM | POA: Diagnosis not present

## 2017-06-19 DIAGNOSIS — I10 Essential (primary) hypertension: Secondary | ICD-10-CM | POA: Diagnosis not present

## 2017-06-19 DIAGNOSIS — M79675 Pain in left toe(s): Secondary | ICD-10-CM | POA: Diagnosis not present

## 2017-06-19 DIAGNOSIS — E890 Postprocedural hypothyroidism: Secondary | ICD-10-CM | POA: Diagnosis not present

## 2017-06-19 NOTE — Progress Notes (Signed)
Subjective: Karen West is a 77 y.o. female patient with history of diabetes who presents to office today complaining of long,mildly painful nails  while ambulating in shoes; unable to trim. Patient states that the glucose reading this morning was 70 mg/dl. Patient denies any new changes in medication or new problems. Patient reports last A1c was 7.   Review of Systems  All other systems reviewed and are negative.    Patient Active Problem List   Diagnosis Date Noted  . S/P lobectomy of lung   . Long QT interval   . Sinus tachycardia   . Abnormal positron emission tomography (PET) scan 05/06/2017  . Hyperthyroidism 10/05/2011  . Chest pain at rest 10/04/2011  . Angina at rest [facility]) 10/04/2011  . Headache(784.0) 10/04/2011  . FH: MI (myocardial infarction) 10/04/2011  . Chest pressure 10/04/2011  . Hyperlipidemia   . Hypertension   . OSA on CPAP   . Breast cancer ([place])   . Chronic anemia   . GERD (gastroesophageal reflux disease)   . Obesity   . Osteoarthritis   . Right bundle branch block   . Atherosclerosis of aorta ([place])   . Multiple thyroid nodules   . Type II or unspecified type diabetes mellitus without mention of complication, uncontrolled   . Chronic headaches   . OBESITY 11/17/2009  . OBSTRUCTIVE SLEEP APNEA 01/05/2009  . DIABETES, TYPE 2 11/12/2008  . HYPERLIPIDEMIA 11/12/2008  . HYPERTENSION 11/12/2008  . Lung nodule 11/12/2008  . G E R D 11/12/2008  . UNSPECIFIED DISORDER OF KIDNEY AND URETER 11/12/2008  . OSTEOARTHRITIS 11/12/2008   Current Outpatient Medications on File Prior to Visit  Medication Sig Dispense Refill  . acetaminophen (TYLENOL) 500 MG tablet Take 1,000 mg by mouth every 6 (six) hours as needed for moderate pain or headache.    Marland Kitchen amLODipine (NORVASC) 10 MG tablet Take 10 mg by mouth daily.    Marland Kitchen aspirin 81 MG tablet Take 81 mg by mouth daily.      . calcium carbonate (OS-CAL - DOSED IN MG OF ELEMENTAL CALCIUM) 1250 (500 Ca) MG tablet Take  1 tablet (500 mg of elemental calcium total) by mouth 3 (three) times daily with meals. 90 tablet 1  . capsaicin (ZOSTRIX) 0.025 % cream Apply 1 application topically daily as needed (for knee pain).    . Cholecalciferol (VITAMIN D3) 1000 units CAPS Take 1,000 Units by mouth daily.    Marland Kitchen ezetimibe (ZETIA) 10 MG tablet Take 5 mg by mouth daily.     Marland Kitchen glipiZIDE (GLUCOTROL) 10 MG 24 hr tablet Take 10 mg by mouth 2 (two) times daily.      Marland Kitchen ibuprofen (ADVIL,MOTRIN) 200 MG tablet Take 400 mg by mouth every 6 (six) hours as needed.    Marland Kitchen levothyroxine (SYNTHROID) 75 MCG tablet Take 1 tablet (75 mcg total) by mouth daily before breakfast. 30 tablet 3  . magnesium oxide (MAG-OX) 400 (241.3 Mg) MG tablet Take 2 tablets (800 mg total) by mouth 2 (two) times daily. 60 tablet 1  . metFORMIN (GLUCOPHAGE) 1000 MG tablet Take 1,000 mg by mouth 2 (two) times daily.     . metoprolol tartrate (LOPRESSOR) 25 MG tablet Take 1 tablet (25 mg total) by mouth 2 (two) times daily. 60 tablet 1  . Naphazoline HCl (CLEAR EYES OP) Place 1 drop into both eyes daily as needed (for dry eyes).    . Omega-3 Fatty Acids (FISH OIL) 1200 MG CAPS Take 1,200 mg by mouth  2 (two) times daily.     Marland Kitchen omeprazole (PRILOSEC) 20 MG capsule Take 20 mg by mouth 2 (two) times daily.     Marland Kitchen oxyCODONE (OXY IR/ROXICODONE) 5 MG immediate release tablet Take 1 tablet (5 mg total) by mouth every 6 (six) hours as needed for severe pain. 10 tablet 0  . rosuvastatin (CRESTOR) 20 MG tablet Take 20 mg by mouth daily.    Marland Kitchen triamterene-hydrochlorothiazide (MAXZIDE-25) 37.5-25 MG tablet Take 1 tablet by mouth daily. 30 tablet 1   No current facility-administered medications on file prior to visit.    Allergies  Allergen Reactions  . Tramadol Nausea And Vomiting and Other (See Comments)    "got sick to my stomach and was throwing up blood; it put me in the hospital")  . Lisinopril Rash and Other (See Comments)    Renal failure   . Tapazole [Methimazole]  Itching and Rash    Recent Results (from the past 2160 hour(s))  Glucose, capillary     Status: Abnormal   Collection Time: 05/08/17  1:09 PM  Result Value Ref Range   Glucose-Capillary 114 (H) 65 - 99 mg/dL  APTT     Status: None   Collection Time: 05/08/17  1:20 PM  Result Value Ref Range   aPTT 25 24 - 36 seconds    Comment: Performed at [facility] [address], [place], [place] [address]  CBC     Status: Abnormal   Collection Time: 05/08/17  1:20 PM  Result Value Ref Range   WBC 7.1 4.0 - 10.5 K/uL   RBC 3.99 3.87 - 5.11 MIL/uL   Hemoglobin 11.3 (L) 12.0 - 15.0 g/dL   HCT 34.8 (L) 36.0 - 46.0 %   MCV 87.2 78.0 - 100.0 fL   MCH 28.3 26.0 - 34.0 pg   MCHC 32.5 30.0 - 36.0 g/dL   RDW 14.3 11.5 - 15.5 %   Platelets 301 150 - 400 K/uL    Comment: Performed at [facility], [place] [address], [place], [place] [address]  Comprehensive metabolic panel     Status: Abnormal   Collection Time: 05/08/17  1:20 PM  Result Value Ref Range   Sodium 137 135 - 145 mmol/L   Potassium 3.2 (L) 3.5 - 5.1 mmol/L   Chloride 98 (L) 101 - 111 mmol/L   CO2 26 22 - 32 mmol/L   Glucose, Bld 119 (H) 65 - 99 mg/dL   BUN 15 6 - 20 mg/dL   Creatinine, Ser 1.02 (H) 0.44 - 1.00 mg/dL   Calcium 10.3 8.9 - 10.3 mg/dL   Total Protein 7.4 6.5 - 8.1 g/dL   Albumin 3.9 3.5 - 5.0 g/dL   AST 24 15 - 41 U/L   ALT 17 14 - 54 U/L   Alkaline Phosphatase 56 38 - 126 U/L   Total Bilirubin 0.6 0.3 - 1.2 mg/dL   GFR calc non Af Amer 53 (L) >60 mL/min   GFR calc Af Amer >60 >60 mL/min    Comment: (NOTE) The eGFR has been calculated using the CKD EPI equation. This calculation has not been validated in all clinical situations. eGFR's persistently <60 mL/min signify possible Chronic Kidney Disease.    Anion gap 13 5 - 15    Comment: Performed at [facility] [address] [place], [place] [address]  Protime-INR     Status: None   Collection Time: 05/08/17  1:20 PM  Result Value Ref Range  Prothrombin Time 12.5 11.4 - 15.2 seconds   INR 0.95     Comment: Performed at [facility], [place] [address] [place], [place] [address]  Hemoglobin A1c     Status: Abnormal   Collection Time: 05/08/17  1:25 PM  Result Value Ref Range   Hgb A1c MFr Bld 6.9 (H) 4.8 - 5.6 %    Comment: (NOTE) Pre diabetes:          5.7%-6.4% Diabetes:              >6.4% Glycemic control for   <7.0% adults with diabetes    Mean Plasma Glucose 151.33 mg/dL    Comment: Performed at [facility] [address], [place], [place] [address]  Glucose, capillary     Status: Abnormal   Collection Time: 05/10/17  6:49 AM  Result Value Ref Range   Glucose-Capillary 119 (H) 65 - 99 mg/dL  Glucose, capillary     Status: Abnormal   Collection Time: 05/10/17 11:17 AM  Result Value Ref Range   Glucose-Capillary 181 (H) 65 - 99 mg/dL  Glucose, capillary     Status: Abnormal   Collection Time: 05/10/17  5:14 PM  Result Value Ref Range   Glucose-Capillary 243 (H) 65 - 99 mg/dL  Glucose, capillary     Status: Abnormal   Collection Time: 05/10/17  9:16 PM  Result Value Ref Range   Glucose-Capillary 289 (H) 65 - 99 mg/dL   Comment 1 Notify RN    Comment 2 Document in Chart   Basic metabolic panel     Status: Abnormal   Collection Time: 05/11/17  6:26 AM  Result Value Ref Range   Sodium 139 135 - 145 mmol/L   Potassium 3.4 (L) 3.5 - 5.1 mmol/L   Chloride 98 (L) 101 - 111 mmol/L   CO2 26 22 - 32 mmol/L   Glucose, Bld 241 (H) 65 - 99 mg/dL   BUN 16 6 - 20 mg/dL   Creatinine, Ser 1.06 (H) 0.44 - 1.00 mg/dL   Calcium 9.9 8.9 - 10.3 mg/dL   GFR calc non Af Amer 50 (L) >60 mL/min   GFR calc Af Amer 58 (L) >60 mL/min    Comment: (NOTE) The eGFR has been calculated using the CKD EPI equation. This calculation has not been validated in all clinical situations. eGFR's persistently <60 mL/min signify possible Chronic Kidney Disease.    Anion gap 15 5 - 15    Comment: Performed at [facility] [address], [place], [place] [address]  Glucose, capillary     Status: Abnormal   Collection Time: 05/11/17  8:10 AM  Result Value Ref Range   Glucose-Capillary 222 (H) 65 - 99 mg/dL  Glucose, capillary     Status: Abnormal   Collection Time: 05/11/17 12:43 PM  Result Value Ref Range   Glucose-Capillary 165 (H) 65 - 99 mg/dL  Glucose, capillary     Status: Abnormal   Collection Time: 05/31/17 10:09 AM  Result Value Ref Range   Glucose-Capillary 115 (H) 65 - 99 mg/dL  Surgical pcr screen     Status: None   Collection Time: 05/31/17 10:53 AM  Result Value Ref Range   MRSA, PCR NEGATIVE NEGATIVE   Staphylococcus aureus NEGATIVE NEGATIVE    Comment: (NOTE) The Xpert SA Assay (FDA approved for NASAL specimens in patients 65 years of age and older), is one component of a comprehensive surveillance program. It is not  intended to diagnose infection nor to guide or monitor treatment. Performed at [facility], [place] [address] [place], [place] [address]   APTT     Status: None   Collection Time: 05/31/17 10:53 AM  Result Value Ref Range   aPTT 27 24 - 36 seconds    Comment: Performed at [facility] [address], [place], [place] [address]  CBC     Status: Abnormal   Collection Time: 05/31/17 10:53 AM  Result Value Ref Range   WBC 7.2 4.0 - 10.5 K/uL   RBC 3.97 3.87 - 5.11 MIL/uL   Hemoglobin 11.1 (L) 12.0 - 15.0 g/dL   HCT 33.8 (L) 36.0 - 46.0 %   MCV 85.1 78.0 - 100.0 fL   MCH 28.0 26.0 - 34.0 pg   MCHC 32.8 30.0 - 36.0 g/dL   RDW 14.5 11.5 - 15.5 %   Platelets 304 150 - 400 K/uL    Comment: Performed at [facility], [place] [address], [place], [place] [address]  Comprehensive metabolic panel     Status: Abnormal   Collection Time: 05/31/17 10:53 AM  Result Value Ref Range   Sodium 136 135 - 145 mmol/L   Potassium 3.6 3.5 - 5.1 mmol/L   Chloride 101 101 - 111 mmol/L   CO2 23 22 - 32 mmol/L   Glucose, Bld 103 (H) 65 - 99 mg/dL   BUN 20 6 - 20 mg/dL    Creatinine, Ser 0.99 0.44 - 1.00 mg/dL   Calcium 10.2 8.9 - 10.3 mg/dL   Total Protein 7.4 6.5 - 8.1 g/dL   Albumin 4.0 3.5 - 5.0 g/dL   AST 24 15 - 41 U/L   ALT 22 14 - 54 U/L   Alkaline Phosphatase 56 38 - 126 U/L   Total Bilirubin 0.9 0.3 - 1.2 mg/dL   GFR calc non Af Amer 55 (L) >60 mL/min   GFR calc Af Amer >60 >60 mL/min    Comment: (NOTE) The eGFR has been calculated using the CKD EPI equation. This calculation has not been validated in all clinical situations. eGFR's persistently <60 mL/min signify possible Chronic Kidney Disease.    Anion gap 12 5 - 15    Comment: Performed at [facility] [address], [place], [place] [address]  Protime-INR     Status: None   Collection Time: 05/31/17 10:53 AM  Result Value Ref Range   Prothrombin Time 12.3 11.4 - 15.2 seconds   INR 0.92     Comment: Performed at [facility] [address], [place],  [address]  Blood gas, arterial on room air     Status: Abnormal   Collection Time: 05/31/17 10:54 AM  Result Value Ref Range   FIO2 21.00    pH, Arterial 7.465 (H) 7.350 - 7.450   pCO2 arterial 36.8 32.0 - 48.0 mmHg   pO2, Arterial 122 (H) 83.0 - 108.0 mmHg   Bicarbonate 26.1 20.0 - 28.0 mmol/L   Acid-Base Excess 2.6 (H) 0.0 - 2.0 mmol/L   O2 Saturation 98.5 %   Patient temperature 98.6    Collection site RIGHT BRACHIAL    Drawn by [id number]    Sample type ARTERIAL DRAW    Allens test (pass/fail) PASS PASS  Urinalysis, Routine w reflex microscopic     Status: Abnormal   Collection Time: 05/31/17 10:55 AM  Result Value Ref Range   Color, Urine YELLOW YELLOW   APPearance  CLEAR CLEAR   Specific Gravity, Urine 1.006 1.005 - 1.030   pH 7.0 5.0 - 8.0   Glucose, UA NEGATIVE NEGATIVE mg/dL   Hgb urine dipstick NEGATIVE NEGATIVE   Bilirubin Urine NEGATIVE NEGATIVE   Ketones, ur NEGATIVE NEGATIVE mg/dL   Protein, ur 30 (A) NEGATIVE mg/dL   Nitrite NEGATIVE NEGATIVE   Leukocytes, UA NEGATIVE NEGATIVE   RBC / HPF  0-5 0 - 5 RBC/hpf   WBC, UA 0-5 0 - 5 WBC/hpf   Bacteria, UA RARE (A) NONE SEEN   Squamous Epithelial / LPF NONE SEEN NONE SEEN    Comment: Performed at [facility], [place] [address] [place], [place] [address]  Type and screen     Status: None   Collection Time: 05/31/17 11:10 AM  Result Value Ref Range   ABO/RH(D) B POS    Antibody Screen NEG    Sample Expiration 06/14/2017    Extend sample reason      NO TRANSFUSIONS OR PREGNANCY IN THE PAST 3 MONTHS Performed at [facility], [place] [address] [place], [place] [address]   Glucose, capillary     Status: Abnormal   Collection Time: 06/04/17  5:47 AM  Result Value Ref Range   Glucose-Capillary 107 (H) 65 - 99 mg/dL  Glucose, capillary     Status: Abnormal   Collection Time: 06/04/17 12:34 PM  Result Value Ref Range   Glucose-Capillary 180 (H) 65 - 99 mg/dL  Glucose, capillary     Status: Abnormal   Collection Time: 06/04/17  1:46 PM  Result Value Ref Range   Glucose-Capillary 198 (H) 65 - 99 mg/dL  Glucose, capillary     Status: Abnormal   Collection Time: 06/04/17  5:00 PM  Result Value Ref Range   Glucose-Capillary 264 (H) 65 - 99 mg/dL   Comment 1 Capillary Specimen   Glucose, capillary     Status: Abnormal   Collection Time: 06/04/17  7:29 PM  Result Value Ref Range   Glucose-Capillary 280 (H) 65 - 99 mg/dL   Comment 1 Notify RN   Glucose, capillary     Status: Abnormal   Collection Time: 06/04/17 11:37 PM  Result Value Ref Range   Glucose-Capillary 251 (H) 65 - 99 mg/dL   Comment 1 Notify RN   Glucose, capillary     Status: Abnormal   Collection Time: 06/05/17  3:46 AM  Result Value Ref Range   Glucose-Capillary 167 (H) 65 - 99 mg/dL   Comment 1 Notify RN   CBC     Status: Abnormal   Collection Time: 06/05/17  4:02 AM  Result Value Ref Range   WBC 7.8 4.0 - 10.5 K/uL   RBC 2.79 (L) 3.87 - 5.11 MIL/uL   Hemoglobin 7.8 (L) 12.0 - 15.0 g/dL   HCT 23.4 (L) 36.0 - 46.0 %   MCV 83.9 78.0 - 100.0 fL    MCH 28.0 26.0 - 34.0 pg   MCHC 33.3 30.0 - 36.0 g/dL   RDW 14.2 11.5 - 15.5 %   Platelets 218 150 - 400 K/uL    Comment: Performed at [facility], [place]. [address] [place], [place] [address]  Basic metabolic panel     Status: Abnormal   Collection Time: 06/05/17  4:02 AM  Result Value Ref Range   Sodium 140 135 - 145 mmol/L   Potassium 3.3 (L) 3.5 - 5.1 mmol/L   Chloride 101 101 - 111 mmol/L   CO2  23 22 - 32 mmol/L   Glucose, Bld 162 (H) 65 - 99 mg/dL   BUN 24 (H) 6 - 20 mg/dL   Creatinine, Ser 1.02 (H) 0.44 - 1.00 mg/dL   Calcium 7.4 (L) 8.9 - 10.3 mg/dL   GFR calc non Af Amer 53 (L) >60 mL/min   GFR calc Af Amer >60 >60 mL/min    Comment: (NOTE) The eGFR has been calculated using the CKD EPI equation. This calculation has not been validated in all clinical situations. eGFR's persistently <60 mL/min signify possible Chronic Kidney Disease.    Anion gap 16 (H) 5 - 15    Comment: Performed at [facility], [place] [address] [place], [place] [address]  Blood gas, arterial     Status: Abnormal   Collection Time: 06/05/17  5:10 AM  Result Value Ref Range   O2 Content 1.0 L/min   Delivery systems NASAL CANNULA    pH, Arterial 7.460 (H) 7.350 - 7.450   pCO2 arterial 37.2 32.0 - 48.0 mmHg   pO2, Arterial 88.1 83.0 - 108.0 mmHg   Bicarbonate 26.2 20.0 - 28.0 mmol/L   Acid-Base Excess 2.5 (H) 0.0 - 2.0 mmol/L   O2 Saturation 95.1 %   Patient temperature 98.1    Collection site A-LINE   Glucose, capillary     Status: None   Collection Time: 06/05/17  8:04 AM  Result Value Ref Range   Glucose-Capillary 77 65 - 99 mg/dL   Comment 1 Capillary Specimen   Glucose, capillary     Status: Abnormal   Collection Time: 06/05/17 12:12 PM  Result Value Ref Range   Glucose-Capillary 158 (H) 65 - 99 mg/dL  Glucose, capillary     Status: Abnormal   Collection Time: 06/05/17  5:29 PM  Result Value Ref Range   Glucose-Capillary 217 (H) 65 - 99 mg/dL   Comment 1 Notify RN    Comment  2 Document in Chart   Glucose, capillary     Status: Abnormal   Collection Time: 06/05/17  9:20 PM  Result Value Ref Range   Glucose-Capillary 220 (H) 65 - 99 mg/dL   Comment 1 Notify RN    Comment 2 Document in Chart   CBC     Status: Abnormal   Collection Time: 06/06/17  5:25 AM  Result Value Ref Range   WBC 8.8 4.0 - 10.5 K/uL   RBC 2.68 (L) 3.87 - 5.11 MIL/uL   Hemoglobin 7.5 (L) 12.0 - 15.0 g/dL   HCT 23.1 (L) 36.0 - 46.0 %   MCV 86.2 78.0 - 100.0 fL   MCH 28.0 26.0 - 34.0 pg   MCHC 32.5 30.0 - 36.0 g/dL   RDW 15.0 11.5 - 15.5 %   Platelets 200 150 - 400 K/uL    Comment: Performed at [facility], [place] [address], [place], [place] [address]  Comprehensive metabolic panel     Status: Abnormal   Collection Time: 06/06/17  5:25 AM  Result Value Ref Range   Sodium 137 135 - 145 mmol/L   Potassium 3.7 3.5 - 5.1 mmol/L   Chloride 103 101 - 111 mmol/L   CO2 24 22 - 32 mmol/L   Glucose, Bld 119 (H) 65 - 99 mg/dL   BUN 20 6 - 20 mg/dL   Creatinine, Ser 1.05 (H) 0.44 - 1.00 mg/dL   Calcium 6.6 (L) 8.9 - 10.3 mg/dL   Total Protein 5.7 (L) 6.5 - 8.1 g/dL  Albumin 3.1 (L) 3.5 - 5.0 g/dL   AST 21 15 - 41 U/L   ALT 10 (L) 14 - 54 U/L   Alkaline Phosphatase 42 38 - 126 U/L   Total Bilirubin 0.5 0.3 - 1.2 mg/dL   GFR calc non Af Amer 51 (L) >60 mL/min   GFR calc Af Amer 59 (L) >60 mL/min    Comment: (NOTE) The eGFR has been calculated using the CKD EPI equation. This calculation has not been validated in all clinical situations. eGFR's persistently <60 mL/min signify possible Chronic Kidney Disease.    Anion gap 10 5 - 15    Comment: Performed at [facility] [address] [place], [place] [address]  Glucose, capillary     Status: Abnormal   Collection Time: 06/06/17  8:00 AM  Result Value Ref Range   Glucose-Capillary 154 (H) 65 - 99 mg/dL   Comment 1 Notify RN    Comment 2 Document in Chart   Glucose, capillary     Status: Abnormal   Collection Time:  06/06/17 12:20 PM  Result Value Ref Range   Glucose-Capillary 134 (H) 65 - 99 mg/dL  Glucose, capillary     Status: Abnormal   Collection Time: 06/06/17  5:08 PM  Result Value Ref Range   Glucose-Capillary 167 (H) 65 - 99 mg/dL   Comment 1 Notify RN    Comment 2 Document in Chart   Glucose, capillary     Status: Abnormal   Collection Time: 06/06/17  9:27 PM  Result Value Ref Range   Glucose-Capillary 145 (H) 65 - 99 mg/dL  Glucose, capillary     Status: Abnormal   Collection Time: 06/07/17  8:32 AM  Result Value Ref Range   Glucose-Capillary 150 (H) 65 - 99 mg/dL   Comment 1 Notify RN    Comment 2 Document in Chart   Glucose, capillary     Status: Abnormal   Collection Time: 06/07/17 12:12 PM  Result Value Ref Range   Glucose-Capillary 180 (H) 65 - 99 mg/dL   Comment 1 Notify RN    Comment 2 Document in Chart   Glucose, capillary     Status: Abnormal   Collection Time: 06/07/17  4:20 PM  Result Value Ref Range   Glucose-Capillary 129 (H) 65 - 99 mg/dL   Comment 1 Notify RN    Comment 2 Document in Chart   Glucose, capillary     Status: Abnormal   Collection Time: 06/07/17  9:32 PM  Result Value Ref Range   Glucose-Capillary 132 (H) 65 - 99 mg/dL  CBC     Status: Abnormal   Collection Time: 06/08/17  4:20 AM  Result Value Ref Range   WBC 5.3 4.0 - 10.5 K/uL   RBC 2.72 (L) 3.87 - 5.11 MIL/uL   Hemoglobin 7.5 (L) 12.0 - 15.0 g/dL   HCT 23.2 (L) 36.0 - 46.0 %   MCV 85.3 78.0 - 100.0 fL   MCH 27.6 26.0 - 34.0 pg   MCHC 32.3 30.0 - 36.0 g/dL   RDW 14.7 11.5 - 15.5 %   Platelets 219 150 - 400 K/uL    Comment: Performed at [facility], [place]. [address] [place], [place] [address]  Basic metabolic panel     Status: Abnormal   Collection Time: 06/08/17  4:20 AM  Result Value Ref Range   Sodium 135 135 - 145 mmol/L   Potassium 3.4 (L) 3.5 - 5.1 mmol/L  Chloride 102 101 - 111 mmol/L   CO2 21 (L) 22 - 32 mmol/L   Glucose, Bld 92 65 - 99 mg/dL   BUN 31 (H) 6 - 20 mg/dL    Creatinine, Ser 1.00 0.44 - 1.00 mg/dL   Calcium 6.2 (LL) 8.9 - 10.3 mg/dL    Comment: CRITICAL RESULT CALLED TO, READ BACK BY AND VERIFIED WITH: KIRBY,M RN 06/08/2017 0547 JORDANS    GFR calc non Af Amer 54 (L) >60 mL/min   GFR calc Af Amer >60 >60 mL/min    Comment: (NOTE) The eGFR has been calculated using the CKD EPI equation. This calculation has not been validated in all clinical situations. eGFR's persistently <60 mL/min signify possible Chronic Kidney Disease.    Anion gap 12 5 - 15    Comment: Performed at [facility] [address], [place], [place] [address]  Glucose, capillary     Status: Abnormal   Collection Time: 06/08/17  7:56 AM  Result Value Ref Range   Glucose-Capillary 130 (H) 65 - 99 mg/dL  Glucose, capillary     Status: Abnormal   Collection Time: 06/08/17 11:24 AM  Result Value Ref Range   Glucose-Capillary 180 (H) 65 - 99 mg/dL  Troponin I (q 6hr x 3)     Status: None   Collection Time: 06/08/17  4:48 PM  Result Value Ref Range   Troponin I <0.03 <0.03 ng/mL    Comment: Performed at [facility], [place] [address] [place], [place] [address]  Comprehensive metabolic panel     Status: Abnormal   Collection Time: 06/08/17  4:49 PM  Result Value Ref Range   Sodium 133 (L) 135 - 145 mmol/L   Potassium 4.0 3.5 - 5.1 mmol/L   Chloride 105 101 - 111 mmol/L   CO2 17 (L) 22 - 32 mmol/L   Glucose, Bld 172 (H) 65 - 99 mg/dL   BUN 30 (H) 6 - 20 mg/dL   Creatinine, Ser 1.18 (H) 0.44 - 1.00 mg/dL   Calcium 6.0 (LL) 8.9 - 10.3 mg/dL    Comment: CRITICAL RESULT CALLED TO, READ BACK BY AND VERIFIED WITH: K. RALL RN [id number] GREEN R    Total Protein 5.8 (L) 6.5 - 8.1 g/dL   Albumin 2.8 (L) 3.5 - 5.0 g/dL   AST 20 15 - 41 U/L   ALT 13 (L) 14 - 54 U/L   Alkaline Phosphatase 51 38 - 126 U/L   Total Bilirubin 0.4 0.3 - 1.2 mg/dL   GFR calc non Af Amer 44 (L) >60 mL/min   GFR calc Af Amer 51 (L) >60 mL/min    Comment: (NOTE) The eGFR has been  calculated using the CKD EPI equation. This calculation has not been validated in all clinical situations. eGFR's persistently <60 mL/min signify possible Chronic Kidney Disease.    Anion gap 11 5 - 15    Comment: Performed at [facility] [address], [place], [place] [address]  Glucose, capillary     Status: Abnormal   Collection Time: 06/08/17  5:15 PM  Result Value Ref Range   Glucose-Capillary 154 (H) 65 - 99 mg/dL  TSH     Status: None   Collection Time: 06/08/17  7:05 PM  Result Value Ref Range   TSH 3.001 0.350 - 4.500 uIU/mL    Comment: Performed by a 3rd Generation assay with a functional sensitivity of <=0.01 uIU/mL. Performed at [facility], [address] [place], [place] [address]   Glucose, capillary     Status: Abnormal   Collection Time: 06/08/17 11:06 PM  Result Value Ref Range   Glucose-Capillary 133 (H) 65 - 99 mg/dL   Comment 1 Notify RN   CBC     Status: Abnormal   Collection Time: 06/09/17  4:45 AM  Result Value Ref Range   WBC 5.1 4.0 - 10.5 K/uL   RBC 2.46 (L) 3.87 - 5.11 MIL/uL   Hemoglobin 7.1 (L) 12.0 - 15.0 g/dL   HCT 21.1 (L) 36.0 - 46.0 %   MCV 85.8 78.0 - 100.0 fL   MCH 28.9 26.0 - 34.0 pg   MCHC 33.6 30.0 - 36.0 g/dL   RDW 15.0 11.5 - 15.5 %   Platelets 246 150 - 400 K/uL    Comment: Performed at [facility], [place] [address], [place], [place] [address]  Basic metabolic panel     Status: Abnormal   Collection Time: 06/09/17  4:45 AM  Result Value Ref Range   Sodium 136 135 - 145 mmol/L   Potassium 3.7 3.5 - 5.1 mmol/L   Chloride 105 101 - 111 mmol/L   CO2 22 22 - 32 mmol/L   Glucose, Bld 73 65 - 99 mg/dL   BUN 28 (H) 6 - 20 mg/dL   Creatinine, Ser 1.19 (H) 0.44 - 1.00 mg/dL   Calcium 6.2 (LL) 8.9 - 10.3 mg/dL    Comment: CRITICAL RESULT CALLED TO, READ BACK BY AND VERIFIED WITH: RICHARDS,H RN 06/09/2017 0552 JORDANS    GFR calc non Af Amer 44 (L) >60 mL/min   GFR calc Af Amer 51 (L) >60 mL/min    Comment:  (NOTE) The eGFR has been calculated using the CKD EPI equation. This calculation has not been validated in all clinical situations. eGFR's persistently <60 mL/min signify possible Chronic Kidney Disease.    Anion gap 9 5 - 15    Comment: Performed at [facility] [address] [place], [place] [address]  Magnesium     Status: Abnormal   Collection Time: 06/09/17  4:45 AM  Result Value Ref Range   Magnesium 0.9 (LL) 1.7 - 2.4 mg/dL    Comment: CRITICAL RESULT CALLED TO, READ BACK BY AND VERIFIED WITH: RICHARDS,H RN 06/09/2017 0552 JORDANS Performed at [facility], [place] [address], [place], [place] [address]   Magnesium     Status: Abnormal   Collection Time: 06/09/17  6:50 AM  Result Value Ref Range   Magnesium 1.0 (L) 1.7 - 2.4 mg/dL    Comment: Performed at [facility] [address], [place], [place] [address]  Glucose, capillary     Status: None   Collection Time: 06/09/17  8:32 AM  Result Value Ref Range   Glucose-Capillary 92 65 - 99 mg/dL   Comment 1 Notify RN    Comment 2 Document in Chart   Glucose, capillary     Status: Abnormal   Collection Time: 06/09/17 12:52 PM  Result Value Ref Range   Glucose-Capillary 191 (H) 65 - 99 mg/dL   Comment 1 Notify RN    Comment 2 Document in Chart   ECHOCARDIOGRAM COMPLETE     Status: None   Collection Time: 06/09/17  3:00 PM  Result Value Ref Range   Weight 2,457.6 oz   Height 62 in   BP 131/67 mmHg  Glucose, capillary     Status: Abnormal   Collection  Time: 06/09/17  5:00 PM  Result Value Ref Range   Glucose-Capillary 170 (H) 65 - 99 mg/dL   Comment 1 Notify RN    Comment 2 Document in Chart   Glucose, capillary     Status: Abnormal   Collection Time: 06/09/17  9:15 PM  Result Value Ref Range   Glucose-Capillary 154 (H) 65 - 99 mg/dL  Basic metabolic panel     Status: Abnormal   Collection Time: 06/10/17  5:30 AM  Result Value Ref Range   Sodium 139 135 - 145 mmol/L   Potassium 3.7 3.5 - 5.1  mmol/L   Chloride 104 101 - 111 mmol/L   CO2 24 22 - 32 mmol/L   Glucose, Bld 85 65 - 99 mg/dL   BUN 23 (H) 6 - 20 mg/dL   Creatinine, Ser 1.11 (H) 0.44 - 1.00 mg/dL   Calcium 6.7 (L) 8.9 - 10.3 mg/dL   GFR calc non Af Amer 48 (L) >60 mL/min   GFR calc Af Amer 55 (L) >60 mL/min    Comment: (NOTE) The eGFR has been calculated using the CKD EPI equation. This calculation has not been validated in all clinical situations. eGFR's persistently <60 mL/min signify possible Chronic Kidney Disease.    Anion gap 11 5 - 15    Comment: Performed at [facility] [address], [place], [place] [address]  Magnesium     Status: None   Collection Time: 06/10/17  5:30 AM  Result Value Ref Range   Magnesium 2.3 1.7 - 2.4 mg/dL    Comment: Performed at [facility] [address], [place], [place] [address]  CBC     Status: Abnormal   Collection Time: 06/10/17  5:30 AM  Result Value Ref Range   WBC 6.1 4.0 - 10.5 K/uL   RBC 2.55 (L) 3.87 - 5.11 MIL/uL   Hemoglobin 7.1 (L) 12.0 - 15.0 g/dL   HCT 21.6 (L) 36.0 - 46.0 %   MCV 84.7 78.0 - 100.0 fL   MCH 27.8 26.0 - 34.0 pg   MCHC 32.9 30.0 - 36.0 g/dL   RDW 14.8 11.5 - 15.5 %   Platelets 299 150 - 400 K/uL    Comment: Performed at [facility], [place] [address] [place], [place] [address]  Glucose, capillary     Status: None   Collection Time: 06/10/17  8:15 AM  Result Value Ref Range   Glucose-Capillary 88 65 - 99 mg/dL   Comment 1 Notify RN    Comment 2 Document in Chart   Magnesium     Status: None   Collection Time: 06/10/17  9:27 AM  Result Value Ref Range   Magnesium 2.3 1.7 - 2.4 mg/dL    Comment: Performed at [facility], [place] [address], [place], [place] [address]  Glucose, capillary     Status: Abnormal   Collection Time: 06/10/17 12:10 PM  Result Value Ref Range   Glucose-Capillary 203 (H) 65 - 99 mg/dL   Comment 1 Notify RN    Comment 2 Document in Chart   Glucose, capillary     Status: Abnormal    Collection Time: 06/10/17  4:44 PM  Result Value Ref Range   Glucose-Capillary 151 (H) 65 - 99 mg/dL   Comment 1 Notify RN    Comment 2 Document in Chart   Glucose, capillary     Status: Abnormal   Collection Time: 06/10/17  9:30 PM  Result  Value Ref Range   Glucose-Capillary 108 (H) 65 - 99 mg/dL  Basic metabolic panel     Status: Abnormal   Collection Time: 06/11/17  5:00 AM  Result Value Ref Range   Sodium 132 (L) 135 - 145 mmol/L   Potassium 4.0 3.5 - 5.1 mmol/L   Chloride 100 (L) 101 - 111 mmol/L   CO2 22 22 - 32 mmol/L   Glucose, Bld 177 (H) 65 - 99 mg/dL   BUN 22 (H) 6 - 20 mg/dL   Creatinine, Ser 1.10 (H) 0.44 - 1.00 mg/dL   Calcium 6.8 (L) 8.9 - 10.3 mg/dL   GFR calc non Af Amer 48 (L) >60 mL/min   GFR calc Af Amer 56 (L) >60 mL/min    Comment: (NOTE) The eGFR has been calculated using the CKD EPI equation. This calculation has not been validated in all clinical situations. eGFR's persistently <60 mL/min signify possible Chronic Kidney Disease.    Anion gap 10 5 - 15    Comment: Performed at [facility] [address], [place], [place] [address]  Glucose, capillary     Status: Abnormal   Collection Time: 06/11/17  7:59 AM  Result Value Ref Range   Glucose-Capillary 62 (L) 65 - 99 mg/dL  Glucose, capillary     Status: Abnormal   Collection Time: 06/11/17  9:01 AM  Result Value Ref Range   Glucose-Capillary 123 (H) 65 - 99 mg/dL  Glucose, capillary     Status: Abnormal   Collection Time: 06/11/17  5:01 PM  Result Value Ref Range   Glucose-Capillary 168 (H) 65 - 99 mg/dL  Glucose, capillary     Status: None   Collection Time: 06/11/17  9:15 PM  Result Value Ref Range   Glucose-Capillary 94 65 - 99 mg/dL   Comment 1 Notify RN    Comment 2 Document in Chart   CBC     Status: Abnormal   Collection Time: 06/12/17  5:58 AM  Result Value Ref Range   WBC 7.4 4.0 - 10.5 K/uL   RBC 2.56 (L) 3.87 - 5.11 MIL/uL   Hemoglobin 7.0 (L) 12.0 - 15.0 g/dL   HCT 21.8  (L) 36.0 - 46.0 %   MCV 85.2 78.0 - 100.0 fL   MCH 27.3 26.0 - 34.0 pg   MCHC 32.1 30.0 - 36.0 g/dL   RDW 14.9 11.5 - 15.5 %   Platelets 363 150 - 400 K/uL    Comment: Performed at [facility], [place]. [address], [place], [place] [address]  Comprehensive metabolic panel     Status: Abnormal   Collection Time: 06/12/17  5:58 AM  Result Value Ref Range   Sodium 137 135 - 145 mmol/L   Potassium 3.8 3.5 - 5.1 mmol/L   Chloride 104 101 - 111 mmol/L   CO2 23 22 - 32 mmol/L   Glucose, Bld 55 (L) 65 - 99 mg/dL   BUN 20 6 - 20 mg/dL   Creatinine, Ser 1.11 (H) 0.44 - 1.00 mg/dL   Calcium 7.1 (L) 8.9 - 10.3 mg/dL   Total Protein 5.7 (L) 6.5 - 8.1 g/dL   Albumin 2.6 (L) 3.5 - 5.0 g/dL   AST 49 (H) 15 - 41 U/L   ALT 43 14 - 54 U/L   Alkaline Phosphatase 60 38 - 126 U/L   Total Bilirubin 0.4 0.3 - 1.2 mg/dL   GFR calc non Af Amer 48 (L) >60 mL/min   GFR  calc Af Amer 55 (L) >60 mL/min    Comment: (NOTE) The eGFR has been calculated using the CKD EPI equation. This calculation has not been validated in all clinical situations. eGFR's persistently <60 mL/min signify possible Chronic Kidney Disease.    Anion gap 10 5 - 15    Comment: Performed at [facility] [address], [place], [place] [address]  Glucose, capillary     Status: Abnormal   Collection Time: 06/12/17  8:54 AM  Result Value Ref Range   Glucose-Capillary 62 (L) 65 - 99 mg/dL   Comment 1 Notify RN    Comment 2 Document in Chart   Glucose, capillary     Status: Abnormal   Collection Time: 06/12/17 12:15 PM  Result Value Ref Range   Glucose-Capillary 200 (H) 65 - 99 mg/dL   Comment 1 Notify RN    Comment 2 Document in Chart   Glucose, capillary     Status: Abnormal   Collection Time: 06/12/17  4:35 PM  Result Value Ref Range   Glucose-Capillary 100 (H) 65 - 99 mg/dL   Comment 1 Notify RN    Comment 2 Document in Chart   Glucose, capillary     Status: Abnormal   Collection Time: 06/12/17  9:25 PM  Result  Value Ref Range   Glucose-Capillary 211 (H) 65 - 99 mg/dL  Glucose, capillary     Status: None   Collection Time: 06/13/17  7:41 AM  Result Value Ref Range   Glucose-Capillary 91 65 - 99 mg/dL  Glucose, capillary     Status: Abnormal   Collection Time: 06/13/17 11:29 AM  Result Value Ref Range   Glucose-Capillary 261 (H) 65 - 99 mg/dL  Glucose, capillary     Status: Abnormal   Collection Time: 06/13/17  4:42 PM  Result Value Ref Range   Glucose-Capillary 117 (H) 65 - 99 mg/dL  Glucose, capillary     Status: Abnormal   Collection Time: 06/13/17  9:09 PM  Result Value Ref Range   Glucose-Capillary 190 (H) 65 - 99 mg/dL  Comprehensive metabolic panel     Status: Abnormal   Collection Time: 06/14/17  6:01 AM  Result Value Ref Range   Sodium 139 135 - 145 mmol/L   Potassium 4.2 3.5 - 5.1 mmol/L   Chloride 105 101 - 111 mmol/L   CO2 22 22 - 32 mmol/L   Glucose, Bld 114 (H) 65 - 99 mg/dL   BUN 18 6 - 20 mg/dL   Creatinine, Ser 1.06 (H) 0.44 - 1.00 mg/dL   Calcium 8.1 (L) 8.9 - 10.3 mg/dL   Total Protein 5.8 (L) 6.5 - 8.1 g/dL   Albumin 2.7 (L) 3.5 - 5.0 g/dL   AST 86 (H) 15 - 41 U/L   ALT 98 (H) 14 - 54 U/L   Alkaline Phosphatase 68 38 - 126 U/L   Total Bilirubin 0.4 0.3 - 1.2 mg/dL   GFR calc non Af Amer 50 (L) >60 mL/min   GFR calc Af Amer 58 (L) >60 mL/min    Comment: (NOTE) The eGFR has been calculated using the CKD EPI equation. This calculation has not been validated in all clinical situations. eGFR's persistently <60 mL/min signify possible Chronic Kidney Disease.    Anion gap 12 5 - 15    Comment: Performed at [facility] [address], [place], [place] [address]  Magnesium     Status: Abnormal   Collection Time: 06/14/17  6:01 AM  Result Value Ref Range   Magnesium 1.4 (L) 1.7 - 2.4 mg/dL    Comment: Performed at [facility] [address], [place], [place] [address]  Glucose, capillary     Status: Abnormal   Collection Time: 06/14/17  8:24 AM   Result Value Ref Range   Glucose-Capillary 113 (H) 65 - 99 mg/dL   Comment 1 Notify RN    Comment 2 Document in Chart   Glucose, capillary     Status: Abnormal   Collection Time: 06/14/17 12:15 PM  Result Value Ref Range   Glucose-Capillary 194 (H) 65 - 99 mg/dL   Comment 1 Notify RN    Comment 2 Document in Chart   Glucose, capillary     Status: Abnormal   Collection Time: 06/14/17  4:26 PM  Result Value Ref Range   Glucose-Capillary 119 (H) 65 - 99 mg/dL   Comment 1 Notify RN    Comment 2 Document in Chart   Glucose, capillary     Status: Abnormal   Collection Time: 06/14/17  9:48 PM  Result Value Ref Range   Glucose-Capillary 156 (H) 65 - 99 mg/dL   Comment 1 Notify RN   Glucose, capillary     Status: None   Collection Time: 06/15/17  8:10 AM  Result Value Ref Range   Glucose-Capillary 90 65 - 99 mg/dL   Comment 1 Notify RN    Comment 2 Document in Chart   Glucose, capillary     Status: Abnormal   Collection Time: 06/15/17 11:40 AM  Result Value Ref Range   Glucose-Capillary 244 (H) 65 - 99 mg/dL   Comment 1 Notify RN    Comment 2 Document in Chart     Objective: General: Patient is awake, alert, and oriented x 3 and in no acute distress.  Integument: Skin is warm, dry and supple bilateral. Nails are tender, long, thickened and dystrophic with subungual debris, consistent with onychomycosis, 1-5 bilateral. No signs of infection. No open lesions or preulcerative lesions present bilateral. Remaining integument unremarkable.  Vasculature:  Dorsalis Pedis pulse 1/4 bilateral. Posterior Tibial pulse  1/4 bilateral. Capillary fill time <3 sec 1-5 bilateral. Positive hair growth to the level of the digits.Temperature gradient within normal limits. No varicosities present bilateral. No edema present bilateral.   Neurology: The patient has intact sensation measured with a 5.07/10g Semmes Weinstein Monofilament at all pedal sites bilateral . Vibratory sensation diminished  bilateral with tuning fork. No Babinski sign present bilateral.   Musculoskeletal: Asymptomatic pes planus pedal deformities noted bilateral. Muscular strength 5/5 in all lower extremity muscular groups bilateral without pain on range of motion. No tenderness with calf compression bilateral.  Assessment and Plan: Problem List Items Addressed This Visit    None    Visit Diagnoses    Pain due to onychomycosis of toenails of both feet    -  Primary   Diabetes mellitus without complication ([place])         -Examined patient. -Discussed and educated patient on diabetic foot care, especially with  regards to the vascular, neurological and musculoskeletal systems.  -Stressed the importance of good glycemic control and the detriment of not  controlling glucose levels in relation to the foot. -Mechanically debrided all nails 1-5 bilateral using sterile nail nipper and filed with dremel without incident  -ABN signed  -Answered all patient questions -Patient to return  in 3 months for at risk foot care -Patient advised to call the  office if any problems or questions arise in the meantime.  Landis Martins, DPM

## 2017-06-19 NOTE — Patient Instructions (Signed)

## 2017-06-20 DIAGNOSIS — Z483 Aftercare following surgery for neoplasm: Secondary | ICD-10-CM | POA: Diagnosis not present

## 2017-06-20 DIAGNOSIS — C3412 Malignant neoplasm of upper lobe, left bronchus or lung: Secondary | ICD-10-CM | POA: Diagnosis not present

## 2017-06-20 DIAGNOSIS — D500 Iron deficiency anemia secondary to blood loss (chronic): Secondary | ICD-10-CM | POA: Diagnosis not present

## 2017-06-20 DIAGNOSIS — E669 Obesity, unspecified: Secondary | ICD-10-CM | POA: Diagnosis not present

## 2017-06-20 DIAGNOSIS — K219 Gastro-esophageal reflux disease without esophagitis: Secondary | ICD-10-CM | POA: Diagnosis not present

## 2017-06-20 DIAGNOSIS — E785 Hyperlipidemia, unspecified: Secondary | ICD-10-CM | POA: Diagnosis not present

## 2017-06-20 DIAGNOSIS — I10 Essential (primary) hypertension: Secondary | ICD-10-CM | POA: Diagnosis not present

## 2017-06-20 DIAGNOSIS — M1990 Unspecified osteoarthritis, unspecified site: Secondary | ICD-10-CM | POA: Diagnosis not present

## 2017-06-20 DIAGNOSIS — I700 Atherosclerosis of aorta: Secondary | ICD-10-CM | POA: Diagnosis not present

## 2017-06-20 DIAGNOSIS — Z7982 Long term (current) use of aspirin: Secondary | ICD-10-CM | POA: Diagnosis not present

## 2017-06-20 DIAGNOSIS — Z7722 Contact with and (suspected) exposure to environmental tobacco smoke (acute) (chronic): Secondary | ICD-10-CM | POA: Diagnosis not present

## 2017-06-20 DIAGNOSIS — Z791 Long term (current) use of non-steroidal anti-inflammatories (NSAID): Secondary | ICD-10-CM | POA: Diagnosis not present

## 2017-06-20 DIAGNOSIS — E119 Type 2 diabetes mellitus without complications: Secondary | ICD-10-CM | POA: Diagnosis not present

## 2017-06-20 DIAGNOSIS — Z7984 Long term (current) use of oral hypoglycemic drugs: Secondary | ICD-10-CM | POA: Diagnosis not present

## 2017-06-20 DIAGNOSIS — G4733 Obstructive sleep apnea (adult) (pediatric): Secondary | ICD-10-CM | POA: Diagnosis not present

## 2017-06-20 DIAGNOSIS — E039 Hypothyroidism, unspecified: Secondary | ICD-10-CM | POA: Diagnosis not present

## 2017-06-20 DIAGNOSIS — Z853 Personal history of malignant neoplasm of breast: Secondary | ICD-10-CM | POA: Diagnosis not present

## 2017-06-22 DIAGNOSIS — C3412 Malignant neoplasm of upper lobe, left bronchus or lung: Secondary | ICD-10-CM | POA: Diagnosis not present

## 2017-06-22 DIAGNOSIS — E039 Hypothyroidism, unspecified: Secondary | ICD-10-CM | POA: Diagnosis not present

## 2017-06-22 DIAGNOSIS — Z483 Aftercare following surgery for neoplasm: Secondary | ICD-10-CM | POA: Diagnosis not present

## 2017-06-22 DIAGNOSIS — M1990 Unspecified osteoarthritis, unspecified site: Secondary | ICD-10-CM | POA: Diagnosis not present

## 2017-06-22 DIAGNOSIS — E119 Type 2 diabetes mellitus without complications: Secondary | ICD-10-CM | POA: Diagnosis not present

## 2017-06-22 DIAGNOSIS — D500 Iron deficiency anemia secondary to blood loss (chronic): Secondary | ICD-10-CM | POA: Diagnosis not present

## 2017-06-25 ENCOUNTER — Other Ambulatory Visit: Payer: Self-pay | Admitting: Thoracic Surgery (Cardiothoracic Vascular Surgery)

## 2017-06-25 DIAGNOSIS — Z902 Acquired absence of lung [part of]: Secondary | ICD-10-CM

## 2017-06-26 ENCOUNTER — Encounter: Payer: Self-pay | Admitting: Thoracic Surgery (Cardiothoracic Vascular Surgery)

## 2017-06-26 ENCOUNTER — Ambulatory Visit (INDEPENDENT_AMBULATORY_CARE_PROVIDER_SITE_OTHER): Payer: Self-pay | Admitting: Thoracic Surgery (Cardiothoracic Vascular Surgery)

## 2017-06-26 ENCOUNTER — Ambulatory Visit
Admission: RE | Admit: 2017-06-26 | Discharge: 2017-06-26 | Disposition: A | Discharge status: Home / Self Care | Payer: Medicare Other | Source: Ambulatory Visit | Attending: Thoracic Surgery (Cardiothoracic Vascular Surgery) | Admitting: Thoracic Surgery (Cardiothoracic Vascular Surgery)

## 2017-06-26 ENCOUNTER — Other Ambulatory Visit: Payer: Self-pay

## 2017-06-26 VITALS — BP 128/68 | HR 96 | Ht 62.0 in | Wt 149.2 lb

## 2017-06-26 DIAGNOSIS — C3412 Malignant neoplasm of upper lobe, left bronchus or lung: Secondary | ICD-10-CM

## 2017-06-26 DIAGNOSIS — J939 Pneumothorax, unspecified: Secondary | ICD-10-CM | POA: Diagnosis not present

## 2017-06-26 DIAGNOSIS — Z902 Acquired absence of lung [part of]: Secondary | ICD-10-CM

## 2017-06-26 NOTE — Progress Notes (Signed)
[address]       [place] [address]             [phone]     HPI: Karen West returns for a scheduled postoperative follow-up visit  Karen West is a 77 year old woman who is a lifelong non-smoker.  She was first found to have a lung nodule back in 2010.  The nodule remained stable initially and then she was lost to follow-up.  She was having some shortness of breath back in November and saw Dr. Chase Caller.  He did a CT of the chest which showed an increase in the size of the left upper lobe nodule.  It was hypermetabolic on PET.  She also had a hypermetabolic thyroid.  Dr. Harlow Asa did a thyroidectomy on her in February.  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  On 06/04/2017 I did an endobronchial ultrasound which showed no evidence of nodal metastases.  I then did a left upper lobectomy and node dissection.  Pathology showed a 2 cm adenocarcinoma that was close to but did not involve the staple margin.  All the lymph nodes were negative.  Postoperative course was complicated by an episode of ventricular tachycardia.  She was found to have extremely low calcium and magnesium levels.  After those were corrected she did not have Karen further arrhythmias.  She went home on day 10.  Since discharge she has been doing well.  She has an occasional cough.  She has been taking Tylenol occasionally but is not using Karen oxycodone.  Dr. Shelia Media stopped her Lopressor.  She is anxious to begin driving but does not feel she is quite ready just yet.  She has been walking.  Past Medical History:  Diagnosis Date  . Atherosclerosis of aorta ([place])    a. 01/2017/03/2017 - noted on high res chest CTs.  . Breast cancer ([place])    a. Bilateral --> s/p left mastectomy  . Carotid artery disease ([place])    a. 03/3242 w/ 01-02% LICA stenosis and <72% RICA stenosis; b. 10/2015 Carotid U/S: < 50% BICA stenosis  . Chest pain     a. 09/2011 MV: EF 68%, no ischemia/infarct.  . Chronic anemia   . Chronic headaches    denies  . Coronary artery calcification seen on CT scan    a. 01/2017 High res CT: atherosclerotic calcification of the arterial vascularture, including severe involvement of the coronary arteries; b. 03/2017 CT Chest: coronary and Ao atheroscelrosis.  Marland Kitchen GERD (gastroesophageal reflux disease)   . History of echocardiogram    a. 09/2011 Echo: EF 55-60%, no rwma, triv AI, PASP 43mmHg.  Marland Kitchen Hyperlipidemia   . Hypertension   . Hyperthyroidism   . Left upper lobe pulmonary nodule    a. 02/2017 PET: slowing enlarging 1.7cm LUL nodule w/ low-grade metabolic activity; b. 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 05/2017 s/p VATS.  . Multinodular goiter    a. 02/2017 PET scan- Hypermetabolic nodule;  b. 06/345 s/p thyroidectomy  . Obesity   . OSA on CPAP    cpap  . Osteoarthritis   . Personal history of radiation therapy 1999  . Right bundle branch block   . Type II or unspecified type diabetes mellitus without mention of complication, uncontrolled     Current Outpatient Medications  Medication Sig Dispense Refill  . acetaminophen (TYLENOL) 500 MG tablet Take 1,000 mg by mouth every 6 (  six) hours as needed for moderate pain or headache.    Marland Kitchen amLODipine (NORVASC) 10 MG tablet Take 10 mg by mouth daily.    Marland Kitchen aspirin 81 MG tablet Take 81 mg by mouth daily.      . calcium carbonate (OS-CAL - DOSED IN MG OF ELEMENTAL CALCIUM) 1250 (500 Ca) MG tablet Take 1 tablet (500 mg of elemental calcium total) by mouth 3 (three) times daily with meals. 90 tablet 1  . capsaicin (ZOSTRIX) 0.025 % cream Apply 1 application topically daily as needed (for knee pain).    . Cholecalciferol (VITAMIN D3) 1000 units CAPS Take 1,000 Units by mouth daily.    Marland Kitchen ezetimibe (ZETIA) 10 MG tablet Take 5 mg by mouth daily.     Marland Kitchen glipiZIDE (GLUCOTROL) 10 MG 24 hr tablet Take 10 mg by mouth 2 (two) times daily.      Marland Kitchen ibuprofen (ADVIL,MOTRIN)  200 MG tablet Take 400 mg by mouth every 6 (six) hours as needed.    Marland Kitchen levothyroxine (SYNTHROID) 75 MCG tablet Take 1 tablet (75 mcg total) by mouth daily before breakfast. 30 tablet 3  . magnesium oxide (MAG-OX) 400 (241.3 Mg) MG tablet Take 2 tablets (800 mg total) by mouth 2 (two) times daily. 60 tablet 1  . metFORMIN (GLUCOPHAGE) 1000 MG tablet Take 1,000 mg by mouth 2 (two) times daily.     . Naphazoline HCl (CLEAR EYES OP) Place 1 drop into both eyes daily as needed (for dry eyes).    . Omega-3 Fatty Acids (FISH OIL) 1200 MG CAPS Take 1,200 mg by mouth 2 (two) times daily.     Marland Kitchen omeprazole (PRILOSEC) 20 MG capsule Take 20 mg by mouth 2 (two) times daily.     . rosuvastatin (CRESTOR) 20 MG tablet Take 20 mg by mouth daily.    Marland Kitchen triamterene-hydrochlorothiazide (MAXZIDE-25) 37.5-25 MG tablet Take 1 tablet by mouth daily. 30 tablet 1  . metoprolol tartrate (LOPRESSOR) 25 MG tablet Take 1 tablet (25 mg total) by mouth 2 (two) times daily. (Patient not taking: Reported on 06/26/2017) 60 tablet 1   No current facility-administered medications for this visit.     Physical Exam BP 128/68 (BP Location: Right Arm, Patient Position: Sitting, Cuff Size: Large)   Pulse 96   Ht 5\' 2"  (1.575 m)   Wt 149 lb 3.2 oz (67.7 kg)   SpO2 97% Comment: ON RA  BMI 27.28 kg/m  77 year old woman in no acute distress Alert and oriented x3 with no focal deficits Lungs diminished at left anterior, clear posteriorly Cardiac regular rate and rhythm Incisions healing well  Diagnostic Tests: CHEST - 2 VIEW  COMPARISON:  06/15/2017, 06/14/2017, 06/13/2017  FINDINGS: Left lobectomy changes. Persistent left hydropneumothorax with fluid level at the left mid chest. Decreased extrapleural air at the left apex with tiny residual left apical lucency. Stable cardiomediastinal silhouette. Operative changes in the right axilla. Degenerative changes of the spine.  IMPRESSION: 1. Postoperative changes of the left  thorax 2. Persistent left hydropneumothorax but with decreased left apical extrapleural air since the prior study.   Electronically Signed   By: Donavan Foil M.D.   On: 06/26/2017 14:57 I personally reviewed the chest x-ray and concur with the findings noted above.  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  She is really doing  very well at this point in time.  She does still have some incisional pain but is only taking Tylenol occasionally and is not requiring Karen narcotics.  She is not having Karen respiratory issues.  She does have an occasional cough.  I told her she could try an over-the-counter cough medication for that.  Her chest x-ray does show a space that is filling in with fluid.  I recommended that she wait at least another 3 weeks before lifting anything over 10 pounds.  She can start driving whenever she feels that the soreness is improved enough that she could turn the wheel without hesitancy.  She says she is planning to wait a few more weeks before trying to drive.  I will refer her to Dr. Julien Nordmann at our multidisciplinary thoracic oncology clinic.  Plan: Referral to [facility] in 1 month with PA and lateral chest x-ray  [name], MD [facility] [phone]

## 2017-06-27 DIAGNOSIS — M1990 Unspecified osteoarthritis, unspecified site: Secondary | ICD-10-CM | POA: Diagnosis not present

## 2017-06-27 DIAGNOSIS — E039 Hypothyroidism, unspecified: Secondary | ICD-10-CM | POA: Diagnosis not present

## 2017-06-27 DIAGNOSIS — E119 Type 2 diabetes mellitus without complications: Secondary | ICD-10-CM | POA: Diagnosis not present

## 2017-06-27 DIAGNOSIS — D500 Iron deficiency anemia secondary to blood loss (chronic): Secondary | ICD-10-CM | POA: Diagnosis not present

## 2017-06-27 DIAGNOSIS — Z483 Aftercare following surgery for neoplasm: Secondary | ICD-10-CM | POA: Diagnosis not present

## 2017-06-27 DIAGNOSIS — C3412 Malignant neoplasm of upper lobe, left bronchus or lung: Secondary | ICD-10-CM | POA: Diagnosis not present

## 2017-06-29 DIAGNOSIS — D500 Iron deficiency anemia secondary to blood loss (chronic): Secondary | ICD-10-CM | POA: Diagnosis not present

## 2017-06-29 DIAGNOSIS — E039 Hypothyroidism, unspecified: Secondary | ICD-10-CM | POA: Diagnosis not present

## 2017-06-29 DIAGNOSIS — E119 Type 2 diabetes mellitus without complications: Secondary | ICD-10-CM | POA: Diagnosis not present

## 2017-06-29 DIAGNOSIS — Z483 Aftercare following surgery for neoplasm: Secondary | ICD-10-CM | POA: Diagnosis not present

## 2017-06-29 DIAGNOSIS — C3412 Malignant neoplasm of upper lobe, left bronchus or lung: Secondary | ICD-10-CM | POA: Diagnosis not present

## 2017-06-29 DIAGNOSIS — M1990 Unspecified osteoarthritis, unspecified site: Secondary | ICD-10-CM | POA: Diagnosis not present

## 2017-07-02 ENCOUNTER — Ambulatory Visit: Payer: Medicare Other | Admitting: Internal Medicine

## 2017-07-02 DIAGNOSIS — Z09 Encounter for follow-up examination after completed treatment for conditions other than malignant neoplasm: Secondary | ICD-10-CM | POA: Diagnosis not present

## 2017-07-02 DIAGNOSIS — E878 Other disorders of electrolyte and fluid balance, not elsewhere classified: Secondary | ICD-10-CM | POA: Diagnosis not present

## 2017-07-02 DIAGNOSIS — Z483 Aftercare following surgery for neoplasm: Secondary | ICD-10-CM | POA: Diagnosis not present

## 2017-07-02 DIAGNOSIS — E890 Postprocedural hypothyroidism: Secondary | ICD-10-CM | POA: Diagnosis not present

## 2017-07-02 DIAGNOSIS — D500 Iron deficiency anemia secondary to blood loss (chronic): Secondary | ICD-10-CM | POA: Diagnosis not present

## 2017-07-02 DIAGNOSIS — M1990 Unspecified osteoarthritis, unspecified site: Secondary | ICD-10-CM | POA: Diagnosis not present

## 2017-07-02 DIAGNOSIS — E119 Type 2 diabetes mellitus without complications: Secondary | ICD-10-CM | POA: Diagnosis not present

## 2017-07-02 DIAGNOSIS — E039 Hypothyroidism, unspecified: Secondary | ICD-10-CM | POA: Diagnosis not present

## 2017-07-02 DIAGNOSIS — C3412 Malignant neoplasm of upper lobe, left bronchus or lung: Secondary | ICD-10-CM | POA: Diagnosis not present

## 2017-07-02 DIAGNOSIS — I10 Essential (primary) hypertension: Secondary | ICD-10-CM | POA: Diagnosis not present

## 2017-07-06 ENCOUNTER — Telehealth: Payer: Self-pay | Admitting: *Deleted

## 2017-07-06 DIAGNOSIS — Z902 Acquired absence of lung [part of]: Secondary | ICD-10-CM

## 2017-07-06 NOTE — Telephone Encounter (Signed)
Oncology Nurse Navigator Documentation  Oncology Nurse Navigator Flowsheets 07/06/2017  Navigator Location [facility]  Referral date to RadOnc/MedOnc 07/04/2017  Navigator Encounter Type Telephone/I spoke with patient regarding referral. I gave her an appt to be seen on 07/26/17 at [facility] with Dr. Julien Nordmann. She verbalized understanding of appt time and place.   Telephone Outgoing Call  Treatment Phase Other  Barriers/Navigation Needs Coordination of Care;Education  Education Other  Interventions Coordination of Care;Education  Coordination of Care Appts  Education Method Verbal  Acuity Level 1  Time Spent with Patient 30

## 2017-07-20 ENCOUNTER — Encounter: Payer: Self-pay | Admitting: *Deleted

## 2017-07-20 NOTE — Progress Notes (Signed)
Oncology Nurse Navigator Documentation  Oncology Nurse Navigator Flowsheets 07/20/2017  Navigator Location [facility]  Navigator Encounter Type Letter/Fax/Email/MTOC letter sent today  Barriers/Navigation Needs Education  Education Other  Interventions Education  Education Method Written  Acuity Level 1  Time Spent with Patient 15

## 2017-07-23 ENCOUNTER — Other Ambulatory Visit: Payer: Self-pay | Admitting: Thoracic Surgery (Cardiothoracic Vascular Surgery)

## 2017-07-23 DIAGNOSIS — R911 Solitary pulmonary nodule: Secondary | ICD-10-CM

## 2017-07-24 ENCOUNTER — Ambulatory Visit (INDEPENDENT_AMBULATORY_CARE_PROVIDER_SITE_OTHER): Payer: Self-pay | Admitting: Thoracic Surgery (Cardiothoracic Vascular Surgery)

## 2017-07-24 ENCOUNTER — Ambulatory Visit
Admission: RE | Admit: 2017-07-24 | Discharge: 2017-07-24 | Disposition: A | Discharge status: Home / Self Care | Payer: Medicare Other | Source: Ambulatory Visit | Attending: Thoracic Surgery (Cardiothoracic Vascular Surgery) | Admitting: Thoracic Surgery (Cardiothoracic Vascular Surgery)

## 2017-07-24 ENCOUNTER — Encounter: Payer: Self-pay | Admitting: Thoracic Surgery (Cardiothoracic Vascular Surgery)

## 2017-07-24 ENCOUNTER — Other Ambulatory Visit: Payer: Self-pay

## 2017-07-24 VITALS — BP 130/59 | HR 89 | Resp 18 | Ht 62.0 in | Wt 142.4 lb

## 2017-07-24 DIAGNOSIS — J90 Pleural effusion, not elsewhere classified: Secondary | ICD-10-CM | POA: Diagnosis not present

## 2017-07-24 DIAGNOSIS — Z902 Acquired absence of lung [part of]: Secondary | ICD-10-CM

## 2017-07-24 DIAGNOSIS — R911 Solitary pulmonary nodule: Secondary | ICD-10-CM

## 2017-07-24 NOTE — Progress Notes (Signed)
[address]       ,[place] [address]             [phone]     HPI: Karen West returns for a scheduled follow-up visit.  Karen West is a 77 year old woman who was found to have a lung nodule back in 2010.  She is a lifelong non-smoker.  The nodule remained stable initially but she was lost to follow-up.  Back in November she had some shortness of breath and CT showed the nodule had increased in size.  It was hypermetabolic on PET/CT.  She also had a hypermetabolic thyroid.  She had thyroidectomy in February.  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  On 06/04/2017 I did a endobronchial ultrasound which showed no evidence of tumor in the mediastinal lymph nodes.  I then did a left upper lobectomy and node dissection.  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  I saw in the office on 06/26/2017.  She was having occasional cough at that time.  Overall she was doing fairly well.  Today she says that she is still had a persistent cough.  She is been taking Robitussin for that.  It seems to have gotten better but has not resolved.  It is nonproductive.  She has some incisional pain but is not taking anything for the other than Tylenol.  Past Medical History:  Diagnosis Date  . Atherosclerosis of aorta ([place])    a. 01/2017/03/2017 - noted on high res chest CTs.  . Breast cancer ([place])    a. Bilateral --> s/p left mastectomy  . Carotid artery disease ([place])    a. 09/9388 w/ 30-09% LICA stenosis and <23% RICA stenosis; b. 10/2015 Carotid U/S: < 50% BICA stenosis  . Chest pain    a. 09/2011 MV: EF 68%, no ischemia/infarct.  . Chronic anemia   . Chronic headaches    denies  . Coronary artery calcification seen on CT scan    a. 01/2017 High res CT: atherosclerotic calcification of the arterial vascularture, including severe involvement of the coronary arteries; b. 03/2017  CT Chest: coronary and Ao atheroscelrosis.  Marland Kitchen GERD (gastroesophageal reflux disease)   . History of echocardiogram    a. 09/2011 Echo: EF 55-60%, no rwma, triv AI, PASP 64mmHg.  Marland Kitchen Hyperlipidemia   . Hypertension   . Hyperthyroidism   . Left upper lobe pulmonary nodule    a. 02/2017 PET: slowing enlarging 1.7cm LUL nodule w/ low-grade metabolic activity; b. 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 05/2017 s/p VATS.  . Multinodular goiter    a. 02/2017 PET scan- Hypermetabolic nodule;  b. 04/2631 s/p thyroidectomy  . Obesity   . OSA on CPAP    cpap  . Osteoarthritis   . Personal history of radiation therapy 1999  . Right bundle branch block   . Type II or unspecified type diabetes mellitus without mention of complication, uncontrolled     Current Outpatient Medications  Medication Sig Dispense Refill  . acetaminophen (TYLENOL) 500 MG tablet Take 1,000 mg by mouth every 6 (six) hours as needed for moderate pain or headache.    Marland Kitchen amLODipine (NORVASC) 10 MG tablet Take 10 mg by mouth daily.    Marland Kitchen aspirin 81 MG tablet Take 81 mg by mouth daily.      . capsaicin (ZOSTRIX) 0.025 % cream Apply 1 application topically daily  as needed (for knee pain).    . Cholecalciferol (VITAMIN D3) 1000 units CAPS Take 1,000 Units by mouth daily.    Marland Kitchen ezetimibe (ZETIA) 10 MG tablet Take 5 mg by mouth daily.     Marland Kitchen glipiZIDE (GLUCOTROL) 10 MG 24 hr tablet Take 10 mg by mouth 2 (two) times daily.      Marland Kitchen ibuprofen (ADVIL,MOTRIN) 200 MG tablet Take 400 mg by mouth every 6 (six) hours as needed.    . magnesium oxide (MAG-OX) 400 (241.3 Mg) MG tablet Take 2 tablets (800 mg total) by mouth 2 (two) times daily. 60 tablet 1  . metFORMIN (GLUCOPHAGE) 1000 MG tablet Take 1,000 mg by mouth 2 (two) times daily.     . metoprolol tartrate (LOPRESSOR) 25 MG tablet Take 1 tablet (25 mg total) by mouth 2 (two) times daily. 60 tablet 1  . Omega-3 Fatty Acids (FISH OIL) 1200 MG CAPS Take 1,200 mg by mouth 2 (two) times daily.      Marland Kitchen omeprazole (PRILOSEC) 20 MG capsule Take 20 mg by mouth 2 (two) times daily.     . rosuvastatin (CRESTOR) 20 MG tablet Take 20 mg by mouth daily.    Marland Kitchen triamterene-hydrochlorothiazide (MAXZIDE-25) 37.5-25 MG tablet Take 1 tablet by mouth daily. (Patient taking differently: Take 0.5 tablets by mouth daily. ) 30 tablet 1  . levothyroxine (SYNTHROID, LEVOTHROID) 88 MCG tablet Take 88 mcg by mouth daily.  5   No current facility-administered medications for this visit.     Physical Exam BP (!) 130/59 (BP Location: Right Arm, Patient Position: Sitting, Cuff Size: Normal)   Pulse 89   Resp 18   Ht 5\' 2"  (1.575 m)   Wt 142 lb 6.4 oz (64.6 kg)   SpO2 98% Comment: RA  BMI 26.29 kg/m  77 year old woman in no acute distress Alert and oriented x3 with no focal deficits Lungs diminished at left base, otherwise clear Incisions clean dry and intact Cardiac regular rate and rhythm  Diagnostic Tests: CHEST - 2 VIEW  COMPARISON:  Chest x-ray dated June 26, 2017.  FINDINGS: The patient is rotated to the right. Stable cardiomediastinal silhouette. Postsurgical changes related to prior left upper lobectomy. Low lung volumes. Resolved left-sided pneumothorax with residual small left pleural effusion. No consolidation. Minimal streaky atelectasis in the peripheral right lung base. Unchanged surgical clips in the right axilla. No acute osseous abnormality.  IMPRESSION: 1. Resolved left-sided pneumothorax with residual small left pleural effusion.   Electronically Signed   By: Titus Dubin M.D.   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  She underwent left upper lobectomy on 06/04/2017.  EBUS of the mediastinal nodes as well as the nodes that were dissected time of surgery were all negative for any  metastasis.  She has stage IA disease.  She has had hypocalcemia since her thyroidectomy.  Dr. Shelia Media is managing that.  Her chest x-ray today shows expected postoperative changes.  There are no concerning findings.  She has an appointment with Dr. Julien Nordmann on Thursday.  He will follow her as well.  At this point her activities are unrestricted.  I do want her to start using her left arm more.  I gave her some exercises to do with that.  Plan: Return in 3 months with PA and lateral chest x-ray  Remo Lipps  Chaya Jan, MD [facility] [phone]

## 2017-07-25 ENCOUNTER — Ambulatory Visit (INDEPENDENT_AMBULATORY_CARE_PROVIDER_SITE_OTHER): Payer: Medicare Other | Admitting: Internal Medicine

## 2017-07-25 ENCOUNTER — Encounter: Payer: Self-pay | Admitting: Internal Medicine

## 2017-07-25 VITALS — BP 132/60 | HR 86 | Ht 62.0 in | Wt 142.0 lb

## 2017-07-25 DIAGNOSIS — J439 Emphysema, unspecified: Secondary | ICD-10-CM

## 2017-07-25 DIAGNOSIS — Z902 Acquired absence of lung [part of]: Secondary | ICD-10-CM

## 2017-07-25 DIAGNOSIS — R5381 Other malaise: Secondary | ICD-10-CM

## 2017-07-25 DIAGNOSIS — E890 Postprocedural hypothyroidism: Secondary | ICD-10-CM | POA: Diagnosis not present

## 2017-07-25 DIAGNOSIS — R0602 Shortness of breath: Secondary | ICD-10-CM

## 2017-07-25 NOTE — Progress Notes (Signed)
Subjective:     Patient ID: Karen West, female   DOB: 1940-04-26, 77 y.o.   MRN: [id number]  HPI Followup LUL  Pulmonary nodule in 77 year old ex-passive smoker with prior breast cancer. First noted 08/14/2008  as  incidental finding on a [facility] reserach study scan. Confirmed as unchanged 12/07/2008 (4th month scan). Unchanged on followup CT  03/08/2009 but also present was subcentimeter mediastinal nodes (7th month scan). Had another followup CT chest 06/23/2009 (11th  month scan). This showed enlargement of the LUL nodule to 1.3cm (in retrospect dec 2010 nodule measured at 1.2cm and therefore really unchanged) but unchanged  mediastoinal nodes. PET scan done 07/07/2009 shows NO UPTAKE in mediastinum or LUL nodule.   OV 09/09/2010: Had CT 02/25/2010 shows LUL nodule was unchanged. Had repeat Ct chest today (2 year CT chest) - official report pending. To me, the nodule looks unchanged. There are no new abnormalities.  In terms of symptoms she is asymptomatic. No complaints.   IOV 01/30/2017  Chief Complaint  Patient presents with  . Pulmonary Consult    Self-referral,sob with exertion occass.,denies wheezing or cough    77 year old female here for new evaluation consult for shortness of breath.  I did originally seen her in 2012 over 3 years ago for evaluation of left upper lobe nodule greater than 1 cm.  She is a passive smoker though not an active smoker.  She tells me for the last few months he has had insidious onset of shortness of breath that is new.  It is stable since onset.  It is extremely mild.  She only notices it when she walks a few miles on stable level ground.  She does not know if climbing steps exacerbate shortness of breath because she does not climb steps.  She definitely does not notice it for activities of daily living. Walking desaturation test on 01/30/2017 185 feet x 3 laps with forehead probe:  did   NOT desaturate. Rest pulse ox was 100%, final pulse ox was 98%. HR response  79/min at rest to 124/min at peak exertion and became tachycardic however she did not complain of shortness of breath.  She tells me that within the last 1 year with Dr. Einar Gip she had a cardiac stress test and this was normal.  Evaluation of echocardiogram done in the early 2000 shows ejection fraction 65% and a slight elevation in pulmonary artery systolic pressure.  She definitely denies any orthopnea proximal nocturnal dyspnea wheezing or cough or chest pain  She is obese and is a candidate for diastolic dysfunction.  She does have a breast cancer history   OV 03/05/2017  Chief Complaint  Patient presents with  . Follow-up    PFT done today. CT scan 02/07/17 and PET scan 02/28/17.  Pt states that she has been okay since last visit. Denies any complaints or concerns.    Karen West 1940/09/01 77 y.o. female from [address] [place] [address] Is here to follow-up. She presented for dyspnea and the workup is revealed growth of the left upper lobe lung nodule for which she did a PET scan that shows low active uptake but growth there for low-grade malignancy cannot be ruled out. In terms of her shortness of breath she did have coronary artery calcification but she again tells me that within the last 1 year she's had cardiac stress test with Dr. Einar Gip  and this was normal. The CT scan of the chest and PET scan do  show an enlargement of the thyroid gland with compression of her airway. In talking to her she tells me that she's been biopsied of the thyroid gland at least 18 times with the last one being 9 years ago and the results of always been benign and there for she's been on observation. But she is not aware that her thyroid gland is causing compression. In fact she did vomit function test today and it shows significant flow volume loops suggesting that this anatomic compression could be real    IMPRESSION: CT chest 02/08/16 1. No evidence of interstitial lung disease.- will discuss at  followup dec 2018. Good news 2. Enlarging left lobe of the thyroid with increased luminal narrowing of the trachea when compared with 09/09/2010. This may be the cause of the patient's symptoms.- this is important and she needs to visit with DR Chalmers Cater again. Please send this report to Dr Chalmers Cater. Review of chart shows Dr Chalmers Cater did do an [place] aug 2018. Patient might need surgery for this due to anatomic compression 3. Slight enlargement of a left upper lobe nodule. Difficult to exclude indolent adenocarcinoma.-> Get PET scan due to enlarging nodule and is > 1cm 4. Aortic atherosclerosis (ICD10-170.0). Severe coronary artery Calcification.- can explain dyspnea. When was last stress test or cardiology visit? If > 3 years, refer to cards   Electronically Signed   By: Lorin Picket M.D.   On: 02/07/2017 09:43   PET scan 02/28/17 IMPRESSION: Slowly enlarging 1.7 cm left upper lobe pulmonary nodule with low-grade metabolic activity, suspicious for low-grade adenocarcinoma.  Mild FDG uptake in left hilum and 1 cm right paratracheal lymph node. Lymph node metastases cannot be excluded.  No evidence of metastatic disease within the neck, abdomen, or pelvis.  Multinodular goiter, with mild enlargement of 3.1 cm nodule in the left isthmus which shows hypermetabolic activity. Another focus of hypermetabolic activity is seen in the lateral right thyroid lobe. Thyroid carcinoma cannot be excluded. Recommend thyroid ultrasound for further evaluation.   Electronically Signed   By: Earle Gell M.D.   On: 02/28/2017 14:15   OV 07/25/2017  Chief Complaint  Patient presents with  . Follow-up    Thorascopy done 3/18 by Dr. Roxan Hockey. Pt states she has some difficulty breathing at times but states it is not a real problem. Has an occ dry cough and has some chest tightness. Pt has a visit scheduled tomorrow, 5/9 with Dr. Julien Nordmann.   Follow-up dyspnea due to multinodular goiter and mild  subclinical emphysema seen on CT chest with a history of passive smoking and physical deconditioning Follow-up left lower lobe lung nodule referred to surgery\  Last seen at the end of 2018.  After that [facility] 2019 she underwent thyroidectomy shown to have multinodular goiter without malignancy.  Then in March 2019 underwent lobectomy for early stage lung cancer confirmed.  She now has follow-up appointment with oncology for surveillance.  At this point in time she is just physically deconditioned and slowly improving.  She does still have exertional shortness of breath.  But this is better than before according to her but not according to the sister who is accompanied her. Patient feels better. Does have some wheezing on and off. She is dclinng spiriva inhaler for the subclinical emphysema. She is surprised by the finding. Denies smoking but passive smoking + via husband. Both patient and sister do admit to post surgery x 2 decodnitioning     Simple office walk 185 feet x  3  laps goal with forehead probe 07/25/2017   O2 used Room air  Number laps completed All   Comments about pace Slow pace to normal pace  Resting Pulse Ox/HR 100% and 84/min  Final Pulse Ox/HR 96% and 126/min  Desaturated </= 88% no  Desaturated <= 3% points yes  Got Tachycardic >/= 90/min yes  Symptoms at end of test No complaints  Miscellaneous comments Likely deconditioned       has a past medical history of Atherosclerosis of aorta ([place]), Breast cancer ([place]), Carotid artery disease ([place]), Chest pain, Chronic anemia, Chronic headaches, Coronary artery calcification seen on CT scan, GERD (gastroesophageal reflux disease), History of echocardiogram, Hyperlipidemia, Hypertension, Hyperthyroidism, Left upper lobe pulmonary nodule, Multinodular goiter, Obesity, OSA on CPAP, Osteoarthritis, Personal history of radiation therapy (1999), Right bundle branch block, and Type II or unspecified type diabetes mellitus without  mention of complication, uncontrolled.   reports that she has never smoked. She has never used smokeless tobacco.  Past Surgical History:  Procedure Laterality Date  . BREAST BIOPSY  1993; 1995; 2000   left; right; left  . MASTECTOMY  2000   left  . THYROIDECTOMY  05/10/2017   VIDEO BRONCHOSCOPY WITH ENDOBRONCHIAL NAVIGATION (N/A)  . THYROIDECTOMY N/A 05/10/2017   Procedure: TOTAL THYROIDECTOMY;  Surgeon: Armandina Gemma, MD;  Location: [facility];  Service: General;  Laterality: N/A;  . VIDEO ASSISTED THORACOSCOPY (VATS)/ LOBECTOMY Left 06/04/2017   Procedure: VIDEO ASSISTED THORACOSCOPY (VATS)/LEFT UPPER LOBECTOMY;  Surgeon: [name], MD;  Location: [facility];  Service: Thoracic;  Laterality: Left;  Marland Kitchen VIDEO BRONCHOSCOPY WITH ENDOBRONCHIAL NAVIGATION N/A 05/10/2017   Procedure: VIDEO BRONCHOSCOPY WITH ENDOBRONCHIAL NAVIGATION;  Surgeon: [name], MD;  Location: [facility];  Service: Thoracic;  Laterality: N/A;  . VIDEO BRONCHOSCOPY WITH ENDOBRONCHIAL ULTRASOUND N/A 06/04/2017   Procedure: VIDEO BRONCHOSCOPY WITH ENDOBRONCHIAL ULTRASOUND;  Surgeon: [name], MD;  Location: [facility];  Service: Thoracic;  Laterality: N/A;    Allergies  Allergen Reactions  . Tramadol Nausea And Vomiting and Other (See Comments)    "got sick to my stomach and was throwing up blood; it put me in the hospital")  . Lisinopril Rash and Other (See Comments)    Renal failure   . Tapazole [Methimazole] Itching and Rash    Immunization History  Administered Date(s) Administered  . Influenza-Unspecified 01/16/2017  . Pneumococcal-Unspecified 11/19/2007    Family History  Problem Relation Age of Onset  . Diabetes Brother        x3  . Hypertension Brother        x3  . Diabetes Brother   . Stroke Brother   . Heart attack Mother 67       Mother Died of MI age 76  . Diabetes Sister   . Stroke Sister      Current Outpatient Medications:  .  acetaminophen (TYLENOL) 500 MG tablet, Take  1,000 mg by mouth every 6 (six) hours as needed for moderate pain or headache., Disp: , Rfl:  .  amLODipine (NORVASC) 10 MG tablet, Take 10 mg by mouth daily., Disp: , Rfl:  .  aspirin 81 MG tablet, Take 81 mg by mouth daily.  , Disp: , Rfl:  .  Cholecalciferol (VITAMIN D3) 1000 units CAPS, Take 1,000 Units by mouth daily., Disp: , Rfl:  .  ezetimibe (ZETIA) 10 MG tablet, Take 5 mg by mouth daily. , Disp: , Rfl:  .  glipiZIDE (GLUCOTROL) 10 MG 24 hr tablet, Take 10 mg  by mouth 2 (two) times daily.  , Disp: , Rfl:  .  ibuprofen (ADVIL,MOTRIN) 200 MG tablet, Take 400 mg by mouth every 6 (six) hours as needed., Disp: , Rfl:  .  levothyroxine (SYNTHROID, LEVOTHROID) 88 MCG tablet, Take 88 mcg by mouth daily., Disp: , Rfl: 5 .  magnesium oxide (MAG-OX) 400 (241.3 Mg) MG tablet, Take 2 tablets (800 mg total) by mouth 2 (two) times daily., Disp: 60 tablet, Rfl: 1 .  metFORMIN (GLUCOPHAGE) 1000 MG tablet, Take 1,000 mg by mouth 2 (two) times daily. , Disp: , Rfl:  .  Omega-3 Fatty Acids (FISH OIL) 1200 MG CAPS, Take 1,200 mg by mouth 2 (two) times daily. , Disp: , Rfl:  .  omeprazole (PRILOSEC) 20 MG capsule, Take 20 mg by mouth 2 (two) times daily. , Disp: , Rfl:  .  rosuvastatin (CRESTOR) 20 MG tablet, Take 20 mg by mouth daily., Disp: , Rfl:  .  triamterene-hydrochlorothiazide (MAXZIDE-25) 37.5-25 MG tablet, Take 1 tablet by mouth daily. (Patient taking differently: Take 0.5 tablets by mouth daily. ), Disp: 30 tablet, Rfl: 1   Review of Systems     Objective:   Physical Exam Vitals:   07/25/17 1028  BP: 132/60  Pulse: 86  SpO2: 99%  Weight: 142 lb (64.4 kg)  Height: 5\' 2"  (1.575 m)    Estimated body mass index is 25.97 kg/m as calculated from the following:   Height as of this encounter: 5\' 2"  (1.575 m).   Weight as of this encounter: 142 lb (64.4 kg).  General Appearance:    Looks well but deconditioned  Head:    Normocephalic, without obvious abnormality, atraumatic  Eyes:    PERRL  - yes, conjunctiva/corneas - clear      Ears:    Normal external ear canals, both ears  Nose:   NG tube - no  Throat:  ETT TUBE - no , OG tube - no but has transverse thyroidectomy scar - new  Neck:   Supple,  No enlargement/tenderness/nodules     Lungs:     Clear to auscultation bilaterally,   Chest wall:    No deformity  Heart:    S1 and S2 normal, no murmur, CVP - no.  Pressors - no  Abdomen:     Soft, no masses, no organomegaly  Genitalia:    Not done  Rectal:   not done  Extremities:   Extremities- intact     Skin:   Intact in exposed areas .     Neurologic:   Sedation - none -> RASS - NA . Moves all 4s - yes. CAM-ICU - neg . Orientation - x3 +       Assessment:       ICD-10-CM   1. Shortness of breath R06.02   2. Pulmonary emphysema, unspecified emphysema type ([place]) J43.9   3. Physical deconditioning R53.81   4. Status post lobectomy of lung Z90.2           Plan:      You have mild exertional shortness of breath due to  A) physical deconditioning following thyroid and lung surgery B) you have less lung to work with following lobectomy C) you have very mild emphysema on CT chest due to passive smoking exposure  Plan  - walk test for o2 need   - refer pulmonary rehab for rehab for diagnosis of emphysema and lung cancer  - recommend spiriva inhaler for the mild emphysema but respect fact you  do not want it and want to observe things for the moment  Followup  - as needed   Dr. Brand Males, M.D., [facility].C.P Pulmonary and Critical Care Medicine Staff Physician, [place] Director - Interstitial Lung Disease  Program  Pulmonary [place] at [place], [place], [address]  Pager: [phone], If no answer or between  15:00h - 7:00h: call [phone] Telephone: [phone]

## 2017-07-25 NOTE — Addendum Note (Signed)
Addended by: Lorretta Harp on: 07/25/2017 11:25 AM   Modules accepted: Orders

## 2017-07-25 NOTE — Patient Instructions (Addendum)
    ICD-10-CM   1. Shortness of breath R06.02   2. Pulmonary emphysema, unspecified emphysema type ([place]) J43.9   3. Physical deconditioning R53.81   4. Status post lobectomy of lung Z90.2    You have mild exertional shortness of breath due to  A) physical deconditioning following thyroid and lung surgery B) you have less lung to work with following lobectomy C) you have very mild emphysema on CT chest due to passive smoking exposure  However, you do not need oxygen at rest of normal activities of daily living  Plan   - refer pulmonary rehab for rehab for diagnosis of emphysema and lung cancer  - recommend spiriva inhaler for the mild emphysema but respect fact you do not want it and want to observe things for the moment  Followup  - as needed

## 2017-07-26 ENCOUNTER — Telehealth: Payer: Self-pay | Admitting: Internal Medicine

## 2017-07-26 ENCOUNTER — Encounter: Payer: Self-pay | Admitting: *Deleted

## 2017-07-26 ENCOUNTER — Encounter: Payer: Self-pay | Admitting: Internal Medicine

## 2017-07-26 ENCOUNTER — Inpatient Hospital Stay: Payer: Medicare Other | Attending: Internal Medicine

## 2017-07-26 ENCOUNTER — Other Ambulatory Visit: Payer: Self-pay | Admitting: *Deleted

## 2017-07-26 ENCOUNTER — Inpatient Hospital Stay (HOSPITAL_BASED_OUTPATIENT_CLINIC_OR_DEPARTMENT_OTHER): Payer: Medicare Other | Admitting: Internal Medicine

## 2017-07-26 DIAGNOSIS — Z853 Personal history of malignant neoplasm of breast: Secondary | ICD-10-CM | POA: Insufficient documentation

## 2017-07-26 DIAGNOSIS — E119 Type 2 diabetes mellitus without complications: Secondary | ICD-10-CM | POA: Insufficient documentation

## 2017-07-26 DIAGNOSIS — Z7984 Long term (current) use of oral hypoglycemic drugs: Secondary | ICD-10-CM | POA: Insufficient documentation

## 2017-07-26 DIAGNOSIS — D509 Iron deficiency anemia, unspecified: Secondary | ICD-10-CM

## 2017-07-26 DIAGNOSIS — R0602 Shortness of breath: Secondary | ICD-10-CM | POA: Insufficient documentation

## 2017-07-26 DIAGNOSIS — Z7982 Long term (current) use of aspirin: Secondary | ICD-10-CM

## 2017-07-26 DIAGNOSIS — Z8249 Family history of ischemic heart disease and other diseases of the circulatory system: Secondary | ICD-10-CM

## 2017-07-26 DIAGNOSIS — R530 Neoplastic (malignant) related fatigue: Secondary | ICD-10-CM

## 2017-07-26 DIAGNOSIS — R05 Cough: Secondary | ICD-10-CM | POA: Insufficient documentation

## 2017-07-26 DIAGNOSIS — Z823 Family history of stroke: Secondary | ICD-10-CM | POA: Diagnosis not present

## 2017-07-26 DIAGNOSIS — C3492 Malignant neoplasm of unspecified part of left bronchus or lung: Secondary | ICD-10-CM | POA: Insufficient documentation

## 2017-07-26 DIAGNOSIS — C3412 Malignant neoplasm of upper lobe, left bronchus or lung: Secondary | ICD-10-CM | POA: Diagnosis not present

## 2017-07-26 DIAGNOSIS — N289 Disorder of kidney and ureter, unspecified: Secondary | ICD-10-CM

## 2017-07-26 DIAGNOSIS — C3490 Malignant neoplasm of unspecified part of unspecified bronchus or lung: Secondary | ICD-10-CM

## 2017-07-26 DIAGNOSIS — Z79899 Other long term (current) drug therapy: Secondary | ICD-10-CM | POA: Diagnosis not present

## 2017-07-26 DIAGNOSIS — Z902 Acquired absence of lung [part of]: Secondary | ICD-10-CM

## 2017-07-26 LAB — CBC WITH DIFFERENTIAL (CANCER CENTER ONLY)
Basophils Absolute: 0 10*3/uL (ref 0.0–0.1)
Basophils Relative: 1 %
Eosinophils Absolute: 0.1 10*3/uL (ref 0.0–0.5)
Eosinophils Relative: 2 %
HCT: 26.3 % — ABNORMAL LOW (ref 34.8–46.6)
Hemoglobin: 8.4 g/dL — ABNORMAL LOW (ref 11.6–15.9)
Lymphocytes Relative: 22 %
Lymphs Abs: 1.3 10*3/uL (ref 0.9–3.3)
MCH: 24.8 pg — ABNORMAL LOW (ref 25.1–34.0)
MCHC: 32 g/dL (ref 31.5–36.0)
MCV: 77.7 fL — ABNORMAL LOW (ref 79.5–101.0)
Monocytes Absolute: 0.4 10*3/uL (ref 0.1–0.9)
Monocytes Relative: 8 %
Neutro Abs: 3.9 10*3/uL (ref 1.5–6.5)
Neutrophils Relative %: 67 %
Platelet Count: 439 10*3/uL — ABNORMAL HIGH (ref 145–400)
RBC: 3.38 MIL/uL — ABNORMAL LOW (ref 3.70–5.45)
RDW: 17.3 % — ABNORMAL HIGH (ref 11.2–14.5)
WBC Count: 5.8 10*3/uL (ref 3.9–10.3)

## 2017-07-26 LAB — CMP (CANCER CENTER ONLY)
ALT: 12 U/L (ref 0–55)
AST: 15 U/L (ref 5–34)
Albumin: 3.9 g/dL (ref 3.5–5.0)
Alkaline Phosphatase: 78 U/L (ref 40–150)
Anion gap: 7 (ref 3–11)
BUN: 30 mg/dL — ABNORMAL HIGH (ref 7–26)
CO2: 28 mmol/L (ref 22–29)
Calcium: 12 mg/dL — ABNORMAL HIGH (ref 8.4–10.4)
Chloride: 102 mmol/L (ref 98–109)
Creatinine: 1.74 mg/dL — ABNORMAL HIGH (ref 0.60–1.10)
GFR, Est AFR Am: 32 mL/min — ABNORMAL LOW (ref >60)
GFR, Estimated: 28 mL/min — ABNORMAL LOW (ref >60)
Glucose, Bld: 213 mg/dL — ABNORMAL HIGH (ref 70–140)
Potassium: 3.8 mmol/L (ref 3.5–5.1)
Sodium: 137 mmol/L (ref 136–145)
Total Bilirubin: 0.3 mg/dL (ref 0.2–1.2)
Total Protein: 7.9 g/dL (ref 6.4–8.3)

## 2017-07-26 MED ORDER — FERROUS SULFATE 325 (65 FE) MG PO TBEC: 325.0000 mg | DELAYED_RELEASE_TABLET | Freq: Three times a day (TID) | ORAL | 2 refills | Status: DC

## 2017-07-26 NOTE — Patient Instructions (Signed)
Hypercalcemia Hypercalcemia is having too much calcium in the blood. The body needs calcium to make bones and keep them strong. Calcium also helps the muscles, nerves, brain, and heart work the way they should. Most of the calcium in the body is in the bones. There is also some calcium in the blood. Hypercalcemia can happen when calcium comes out of the bones, or when the kidneys are not able to remove calcium from the blood. Hypercalcemia can be mild or severe. What are the causes? There are many possible causes of hypercalcemia. Common causes include:  Hyperparathyroidism. This is a condition in which the body produces too much parathyroid hormone. There are four parathyroid glands in your neck. These glands produce a chemical messenger (hormone) that helps the body absorb calcium from foods and helps your bones release calcium.  Certain kinds of cancer, such as lung cancer, breast cancer, or myeloma.  Less common causes of hypercalcemia include:  Getting too much calcium or vitamin D from your diet.  Kidney failure.  Hyperthyroidism.  Being on bed rest for a long time.  Certain medicines.  Infections.  Sarcoidosis.  What increases the risk? This condition is more likely to develop in:  Women.  People who are 60 years or older.  People who have a family history of hypercalcemia.  What are the signs or symptoms? Mild hypercalcemia that starts slowly may not cause symptoms. Severe, sudden hypercalcemia is more likely to cause symptoms, such as:  Loss of appetite.  Increased thirst and frequent urination.  Fatigue.  Nausea and vomiting.  Headache.  Abdominal pain.  Muscle pain, twitching, or weakness.  Constipation.  Blood in the urine.  Pain in the side of the back (flank pain).  Anxiety, confusion, or depression.  Irregular heartbeat (arrhythmia).  Loss of consciousness.  How is this diagnosed? This condition may be diagnosed based on:  Your  symptoms.  Blood tests.  Urine tests.  X-rays.  Ultrasound.  MRI.  CT scan.  How is this treated? Treatment for hypercalcemia depends on the cause. Treatment may include:  Receiving fluids through an IV tube.  Medicines that keep calcium levels steady after receiving fluids (loop diuretics).  Medicines that keep calcium in your bones (bisphosphonates).  Medicines that lower the calcium level in your blood.  Surgery to remove overactive parathyroid glands.  Follow these instructions at home:  Take over-the-counter and prescription medicines only as told by your health care provider.  Follow instructions from your health care provider about eating or drinking restrictions.  Drink enough fluid to keep your urine clear or pale yellow.  Stay active. Weight-bearing exercise helps to keep calcium in your bones. Follow instructions from your health care provider about what type and level of exercise is safe for you.  Keep all follow-up visits as told by your health care provider. This is important. Contact a health care provider if:  You have a fever.  You have flank or abdominal pain that is getting worse. Get help right away if:  You have severe abdominal or flank pain.  You have chest pain.  You have trouble breathing.  You become very confused and sleepy.  You lose consciousness. This information is not intended to replace advice given to you by your health care provider. Make sure you discuss any questions you have with your health care provider. Document Released: 05/20/2004 Document Revised: 08/12/2015 Document Reviewed: 07/22/2014 Elsevier Interactive Patient Education  2018 [employer].

## 2017-07-26 NOTE — Telephone Encounter (Signed)
Sent reminder letter in the mail with appt date and time . Lab and f/u with ct in 6 months.

## 2017-07-26 NOTE — Progress Notes (Signed)
[place] Clinical Social Work  Clinical Social Work met with patient/family at [employer] appointment to offer support and assess for psychosocial needs.  The patient was accompanied by her two daughters.  Ms. Crownover lives in [place] and reported no major concerns at this time.   Clinical Social Work briefly discussed Clinical Social Work role and [employer] support programs/services.  The patient and her daughters were interested in wellness activities including [facility] programs and [employer] program.  [place] provided information.  Clinical Social Work encouraged patient to call with any additional questions or concerns.   Maryjean Morn, MSW, LCSW, OSW-C Clinical Social Worker [name] [phone]

## 2017-07-26 NOTE — Progress Notes (Signed)
Oncology Nurse Navigator Documentation  Oncology Nurse Navigator Flowsheets 07/26/2017  Navigator Location [facility]  Navigator Encounter Type Clinic/MDC/I spoke with patient and family at clinic today.  I gave and explained information on lung cancer and resources here at [facility].  Her treatment plan is observation and follow up in 6 months with a scan. Patient verbalized understanding of treatment plan.   Abnormal Finding Date 01/29/2017  Confirmed Diagnosis Date 05/11/2017  Surgery Date 06/04/2017  Multidisiplinary Clinic Date 07/26/2017  Treatment Initiated Date 06/04/2017  Patient Visit Type MedOnc  Treatment Phase Follow-up  Barriers/Navigation Needs Education  Education Coping with Diagnosis/ Prognosis;Newly Diagnosed Cancer Education  Interventions Education  Education Method Verbal;Written  Acuity Level 1  Time Spent with Patient 30

## 2017-07-26 NOTE — Progress Notes (Signed)
[place] [phone]   [phone] Multidisciplinary thoracic oncology clinic  CONSULT NOTE  REFERRING PHYSICIAN: Dr. Modesto Charon  REASON FOR CONSULTATION:  77 years old African-American female recently diagnosed with lung cancer.  HPI Karen West is a 77 y.o. female non-smoker with past medical history significant for coronary artery disease, carotid artery disease, hypertension, dyslipidemia, hyperthyroidism with multinodular goiter, history of bilateral breast cancer status post left mastectomy in 2000 and she was followed at [facility]., GERD, diabetes mellitus.  The patient was found to have a nodule in her left upper lobe of the lung during a research study in 2010.  She was followed for few years with no significant change.  She was seen by Dr. Chase Caller in November 2018 for shortness of breath.  Repeat CT scan of the chest on February 06, 2017 showed enlargement of the posterior left upper lobe nodule which measured 1.2 x 1.4 cm.  There was also an enlarging left lobe of the thyroid with increased luminal narrowing of the trachea.  A PET scan on February 28, 2017 showed slowly enlarging 1.7 cm left upper lobe pulmonary nodule with low-grade metabolic activity suspicious for low-grade adenocarcinoma.  There was also mild FDG uptake in the left hilum and 1.0 cm right paratracheal lymph node.  Lymph node metastasis could not be excluded.  There was no evidence of metastatic disease within the neck, abdomen or pelvis.  The scan also showed the multinodular goiter with mild enlargement of 3.1 cm nodule in the left isthmus which shows hypermetabolic activity.  There was another focus of hypermetabolic activity seen in the lateral right thyroid lobe and thyroid carcinoma could not be excluded.  On May 10, 2017 the patient underwent total thyroidectomy under the care of Dr. Harlow Asa.  In the same setting she underwent  electromagnetic navigational bronchoscopy with needle aspiration, brushing and biopsies of the left upper lobe nodule under the care of Dr. Roxan Hockey.  The final pathology ([id number]) was consistent with mucinous adenocarcinoma of the lung. On June 04, 2017 the patient underwent left VATS with left upper lobectomy and mediastinal lymph node dissection under the care of Dr. Roxan Hockey.  The final pathology ([id number]) showed adenocarcinoma with mucinous features, well-differentiated spanning 2.0 cm.  The resection margins were negative for malignancy except the stapled parenchymal margin near the hilum but this was felt to be secondary to artifacts (run off) due to inking after sectioning and the parenchymal margin was felt to be negative.  The dissected lymph nodes were negative for malignancy. Dr. Roxan Hockey kindly referred the patient to the multidisciplinary thoracic oncology clinic today for evaluation and recommendation regarding her condition. When seen today the patient is feeling fine except for fatigue and dry cough.  She also has shortness of breath with exertion.  She lost around 25 pounds in the last 6 months.  She has no chest pain or hemoptysis.  She has no nausea, vomiting, diarrhea or constipation.  She denied having any fever or chills.  She denied having any headache or visual changes. Family history significant for mother died from heart disease at age 56.  Brother had diabetes mellitus hypertension and stroke and sister had a stroke. The patient is single and has 4 children.  She used to work as an Optometrist.  She was accompanied today by HER-2 daughters Lynelle Smoke and Anderson Malta.  She has no history for smoking but she has secondhand smoking exposure from her husband.  She drinks alcohol occasionally and no history of drug abuse  HPI  Past Medical History:  Diagnosis Date  . Atherosclerosis of aorta ([place])    a. 01/2017/03/2017 - noted on high res chest CTs.  . Breast cancer ([place])     a. Bilateral --> s/p left mastectomy  . Carotid artery disease ([place])    a. 05/4194 w/ 22-29% LICA stenosis and <79% RICA stenosis; b. 10/2015 Carotid U/S: < 50% BICA stenosis  . Chest pain    a. 09/2011 MV: EF 68%, no ischemia/infarct.  . Chronic anemia   . Chronic headaches    denies  . Coronary artery calcification seen on CT scan    a. 01/2017 High res CT: atherosclerotic calcification of the arterial vascularture, including severe involvement of the coronary arteries; b. 03/2017 CT Chest: coronary and Ao atheroscelrosis.  Marland Kitchen GERD (gastroesophageal reflux disease)   . History of echocardiogram    a. 09/2011 Echo: EF 55-60%, no rwma, triv AI, PASP 63mHg.  .Marland KitchenHyperlipidemia   . Hypertension   . Hyperthyroidism   . Left upper lobe pulmonary nodule    a. 02/2017 PET: slowing enlarging 1.7cm LUL nodule w/ low-grade metabolic activity; b. 28/9211Bronch-->mucinous adenocarcinoma;  c. 05/2017 s/p VATS.  . Multinodular goiter    a. 02/2017 PET scan- Hypermetabolic nodule;  b. 29/4174s/p thyroidectomy  . Obesity   . OSA on CPAP    cpap  . Osteoarthritis   . Personal history of radiation therapy 1999  . Right bundle branch block   . Type II or unspecified type diabetes mellitus without mention of complication, uncontrolled     Past Surgical History:  Procedure Laterality Date  . BREAST BIOPSY  1993; 1995; 2000   left; right; left  . MASTECTOMY  2000   left  . THYROIDECTOMY  05/10/2017   VIDEO BRONCHOSCOPY WITH ENDOBRONCHIAL NAVIGATION (N/A)  . THYROIDECTOMY N/A 05/10/2017   Procedure: TOTAL THYROIDECTOMY;  Surgeon: GArmandina Gemma MD;  [facility]  Service: General;  Laterality: N/A;  . VIDEO ASSISTED THORACOSCOPY (VATS)/ LOBECTOMY Left 06/04/2017   Procedure: VIDEO ASSISTED THORACOSCOPY (VATS)/LEFT UPPER LOBECTOMY;  Surgeon: HMelrose Nakayama MD;  Location: [facility]  Service: Thoracic;  Laterality: Left;  .Marland KitchenVIDEO BRONCHOSCOPY WITH ENDOBRONCHIAL NAVIGATION N/A 05/10/2017   Procedure:  VIDEO BRONCHOSCOPY WITH ENDOBRONCHIAL NAVIGATION;  Surgeon: HMelrose Nakayama MD;  [facility]  Service: Thoracic;  Laterality: N/A;  . VIDEO BRONCHOSCOPY WITH ENDOBRONCHIAL ULTRASOUND N/A 06/04/2017   Procedure: VIDEO BRONCHOSCOPY WITH ENDOBRONCHIAL ULTRASOUND;  Surgeon: HMelrose Nakayama MD;  Location: [facility];  Service: Thoracic;  Laterality: N/A;    Family History  Problem Relation Age of Onset  . Diabetes Brother        x3  . Hypertension Brother        x3  . Diabetes Brother   . Stroke Brother   . Heart attack Mother 353      Mother Died of MI age 75 . Diabetes Sister   . Stroke Sister     Social History Social History   Tobacco Use  . Smoking status: Never Smoker  . Smokeless tobacco: Never Used  Substance Use Topics  . Alcohol use: Yes    Comment: 10/04/11 "drink a wine or beeer 3-4 times/year"  . Drug use: No    Allergies  Allergen Reactions  . Tramadol Nausea And Vomiting and Other (See Comments)    "got sick to my stomach and was throwing up blood;  it put me in the hospital")  . Lisinopril Rash and Other (See Comments)    Renal failure   . Tapazole [Methimazole] Itching and Rash    Current Outpatient Medications  Medication Sig Dispense Refill  . acetaminophen (TYLENOL) 500 MG tablet Take 1,000 mg by mouth every 6 (six) hours as needed for moderate pain or headache.    Marland Kitchen amLODipine (NORVASC) 10 MG tablet Take 10 mg by mouth daily.    Marland Kitchen aspirin 81 MG tablet Take 81 mg by mouth daily.      . Cholecalciferol (VITAMIN D3) 1000 units CAPS Take 1,000 Units by mouth daily.    Marland Kitchen ezetimibe (ZETIA) 10 MG tablet Take 5 mg by mouth daily.     Marland Kitchen glipiZIDE (GLUCOTROL) 10 MG 24 hr tablet Take 10 mg by mouth 2 (two) times daily.      Marland Kitchen ibuprofen (ADVIL,MOTRIN) 200 MG tablet Take 400 mg by mouth every 6 (six) hours as needed.    Marland Kitchen levothyroxine (SYNTHROID, LEVOTHROID) 88 MCG tablet Take 88 mcg by mouth daily.  5  . magnesium oxide (MAG-OX) 400 (241.3 Mg) MG  tablet Take 2 tablets (800 mg total) by mouth 2 (two) times daily. 60 tablet 1  . metFORMIN (GLUCOPHAGE) 1000 MG tablet Take 1,000 mg by mouth 2 (two) times daily.     . Omega-3 Fatty Acids (FISH OIL) 1200 MG CAPS Take 1,200 mg by mouth 2 (two) times daily.     Marland Kitchen omeprazole (PRILOSEC) 20 MG capsule Take 20 mg by mouth 2 (two) times daily.     . rosuvastatin (CRESTOR) 20 MG tablet Take 20 mg by mouth daily.    Marland Kitchen triamterene-hydrochlorothiazide (MAXZIDE-25) 37.5-25 MG tablet Take 1 tablet by mouth daily. (Patient taking differently: Take 0.5 tablets by mouth daily. ) 30 tablet 1   No current facility-administered medications for this visit.     Review of Systems  Constitutional: positive for fatigue and weight loss Eyes: negative Ears, nose, mouth, throat, and face: negative Respiratory: positive for cough and dyspnea on exertion Cardiovascular: negative Gastrointestinal: negative Genitourinary:negative Integument/breast: negative Hematologic/lymphatic: negative Musculoskeletal:negative Neurological: negative Behavioral/Psych: negative Endocrine: negative Allergic/Immunologic: negative  Physical Exam  BLT:JQZES, healthy, no distress, well nourished and well developed SKIN: skin color, texture, turgor are normal, no rashes or significant lesions HEAD: Normocephalic, No masses, lesions, tenderness or abnormalities EYES: normal, PERRLA, Conjunctiva are pink and non-injected EARS: External ears normal, Canals clear OROPHARYNX:no exudate, no erythema and lips, buccal mucosa, and tongue normal  NECK: supple, no adenopathy, no JVD LYMPH:  no palpable lymphadenopathy, no hepatosplenomegaly BREAST:not examined LUNGS: clear to auscultation , and palpation HEART: regular rate & rhythm, no murmurs and no gallops ABDOMEN:abdomen soft, non-tender, normal bowel sounds and no masses or organomegaly BACK: Back symmetric, no curvature., No CVA tenderness EXTREMITIES:no joint deformities,  effusion, or inflammation, no edema, no skin discoloration  NEURO: alert & oriented x 3 with fluent speech, no focal motor/sensory deficits  PERFORMANCE STATUS: ECOG 1  LABORATORY DATA: Lab Results  Component Value Date   WBC 5.8 07/26/2017   HGB 8.4 (L) 07/26/2017   HCT 26.3 (L) 07/26/2017   MCV 77.7 (L) 07/26/2017   PLT 439 (H) 07/26/2017      Chemistry      Component Value Date/Time   NA 139 06/14/2017 0601   K 4.2 06/14/2017 0601   CL 105 06/14/2017 0601   CO2 22 06/14/2017 0601   BUN 18 06/14/2017 0601   CREATININE 1.06 (H) 06/14/2017 0601  Component Value Date/Time   CALCIUM 8.1 (L) 06/14/2017 0601   ALKPHOS 68 06/14/2017 0601   AST 86 (H) 06/14/2017 0601   ALT 98 (H) 06/14/2017 0601   BILITOT 0.4 06/14/2017 0601       RADIOGRAPHIC STUDIES: Dg Chest 2 View  Result Date: 07/24/2017 CLINICAL DATA:  Shortness of breath. Recent left upper lobectomy in March. EXAM: CHEST - 2 VIEW COMPARISON:  Chest x-ray dated June 26, 2017. FINDINGS: The patient is rotated to the right. Stable cardiomediastinal silhouette. Postsurgical changes related to prior left upper lobectomy. Low lung volumes. Resolved left-sided pneumothorax with residual small left pleural effusion. No consolidation. Minimal streaky atelectasis in the peripheral right lung base. Unchanged surgical clips in the right axilla. No acute osseous abnormality. IMPRESSION: 1. Resolved left-sided pneumothorax with residual small left pleural effusion. Electronically Signed   By: Titus Dubin M.D.   On: 07/24/2017 11:07    ASSESSMENT: This is a very pleasant 77 years old African-American female recently diagnosed with a stage IA (T1 a, N0, M0) non-small cell lung cancer, adenocarcinoma presented with left upper lobe lung nodule status post left upper lobectomy with lymph node dissection under the care of Dr. Roxan Hockey on June 04, 2017.   PLAN: I had a lengthy discussion with the patient and her family about her  current disease stage, prognosis and treatment options. I discussed with the patient her pathology report. I explained to the patient that the 5-year survival for patient with a stage IA is around 80%. I also explained to the patient that there is no benefit for adjuvant systemic chemotherapy for patient with a stage IA and the standard of care is observation and close monitoring. I will arrange for the patient to have repeat CT scan of the chest in 6 months for restaging of her disease. I will see her back for follow-up visit at that time. For the iron deficiency anemia, I will start the patient on ferrous sulfate 325 mg p.o. 3 times daily. For the renal insufficiency and hypercalcemia, I strongly encouraged the patient to increase her oral intake of fluid.  She will need close follow-up by her primary care physician for these abnormalities. She was advised to call immediately if she has any concerning symptoms in the interval. The patient voices understanding of current disease status and treatment options and is in agreement with the current care plan. All questions were answered. The patient knows to call the clinic with any problems, questions or concerns. We can certainly see the patient much sooner if necessary.  Thank you so much for allowing me to participate in the care of Karen West. I will continue to follow up the patient with you and assist in her care.  I spent 40 minutes counseling the patient face to face. The total time spent in the appointment was 60 minutes.  Disclaimer: This note was dictated with voice recognition software. Similar sounding words can inadvertently be transcribed and may not be corrected upon review.   Eilleen Kempf Jul 26, 2017, 3:34 PM

## 2017-08-14 DIAGNOSIS — E039 Hypothyroidism, unspecified: Secondary | ICD-10-CM | POA: Diagnosis not present

## 2017-08-14 DIAGNOSIS — E1122 Type 2 diabetes mellitus with diabetic chronic kidney disease: Secondary | ICD-10-CM | POA: Diagnosis not present

## 2017-08-14 DIAGNOSIS — N189 Chronic kidney disease, unspecified: Secondary | ICD-10-CM | POA: Diagnosis not present

## 2017-08-14 DIAGNOSIS — N179 Acute kidney failure, unspecified: Secondary | ICD-10-CM | POA: Diagnosis not present

## 2017-08-14 DIAGNOSIS — I129 Hypertensive chronic kidney disease with stage 1 through stage 4 chronic kidney disease, or unspecified chronic kidney disease: Secondary | ICD-10-CM | POA: Diagnosis not present

## 2017-08-14 DIAGNOSIS — N183 Chronic kidney disease, stage 3 (moderate): Secondary | ICD-10-CM | POA: Diagnosis not present

## 2017-08-14 DIAGNOSIS — R809 Proteinuria, unspecified: Secondary | ICD-10-CM | POA: Diagnosis not present

## 2017-08-14 DIAGNOSIS — C50919 Malignant neoplasm of unspecified site of unspecified female breast: Secondary | ICD-10-CM | POA: Diagnosis not present

## 2017-08-14 DIAGNOSIS — E890 Postprocedural hypothyroidism: Secondary | ICD-10-CM | POA: Diagnosis not present

## 2017-08-14 DIAGNOSIS — C3490 Malignant neoplasm of unspecified part of unspecified bronchus or lung: Secondary | ICD-10-CM | POA: Diagnosis not present

## 2017-08-14 DIAGNOSIS — D631 Anemia in chronic kidney disease: Secondary | ICD-10-CM | POA: Diagnosis not present

## 2017-08-16 DIAGNOSIS — E119 Type 2 diabetes mellitus without complications: Secondary | ICD-10-CM | POA: Diagnosis not present

## 2017-08-16 DIAGNOSIS — C3412 Malignant neoplasm of upper lobe, left bronchus or lung: Secondary | ICD-10-CM | POA: Diagnosis not present

## 2017-08-16 DIAGNOSIS — Z483 Aftercare following surgery for neoplasm: Secondary | ICD-10-CM | POA: Diagnosis not present

## 2017-08-16 DIAGNOSIS — D500 Iron deficiency anemia secondary to blood loss (chronic): Secondary | ICD-10-CM | POA: Diagnosis not present

## 2017-08-21 ENCOUNTER — Other Ambulatory Visit (HOSPITAL_COMMUNITY): Payer: Self-pay | Admitting: Internal Medicine

## 2017-08-21 ENCOUNTER — Telehealth: Payer: Self-pay | Admitting: Internal Medicine

## 2017-08-21 DIAGNOSIS — C3490 Malignant neoplasm of unspecified part of unspecified bronchus or lung: Secondary | ICD-10-CM

## 2017-08-21 DIAGNOSIS — J438 Other emphysema: Secondary | ICD-10-CM

## 2017-08-21 NOTE — Telephone Encounter (Signed)
At her last OV, MR advised her to follow up as needed.  Spoke with pt. She though this meant she was dismissed. Advised her that this is not the case. Pt would like to be referred to pulmonary rehab in [place]. Referral has been placed. Nothing further was needed.

## 2017-08-24 ENCOUNTER — Encounter (HOSPITAL_COMMUNITY): Payer: Medicare Other

## 2017-08-28 ENCOUNTER — Telehealth (HOSPITAL_COMMUNITY): Payer: Self-pay

## 2017-08-28 ENCOUNTER — Ambulatory Visit
Admission: RE | Admit: 2017-08-28 | Discharge: 2017-08-28 | Disposition: A | Discharge status: Home / Self Care | Payer: Medicare Other | Source: Ambulatory Visit | Attending: Obstetrics and Gynecology | Admitting: Obstetrics and Gynecology

## 2017-08-28 DIAGNOSIS — Z1231 Encounter for screening mammogram for malignant neoplasm of breast: Secondary | ICD-10-CM | POA: Diagnosis not present

## 2017-08-28 NOTE — Telephone Encounter (Signed)
Attempted to call patient twice in regards to Pulmonary rehab - Seemed like patient would pick up and then hangup. Sending letter.

## 2017-08-30 ENCOUNTER — Telehealth (HOSPITAL_COMMUNITY): Payer: Self-pay

## 2017-08-30 NOTE — Telephone Encounter (Signed)
Called patient in regards to Pulmonary Rehab - Scheduled orientation on 09/26/17 at 1:30pm. Patient will attend the 10:30am exc class. Went over insurance with patient, verbalized understanding. Mailed packet.

## 2017-09-05 ENCOUNTER — Encounter (HOSPITAL_COMMUNITY): Payer: Medicare Other

## 2017-09-07 DIAGNOSIS — H40013 Open angle with borderline findings, low risk, bilateral: Secondary | ICD-10-CM | POA: Diagnosis not present

## 2017-09-10 ENCOUNTER — Ambulatory Visit: Payer: Medicare Other

## 2017-09-11 ENCOUNTER — Encounter: Payer: Self-pay | Admitting: Sports Medicine

## 2017-09-11 ENCOUNTER — Other Ambulatory Visit: Payer: Self-pay | Admitting: Sports Medicine

## 2017-09-11 ENCOUNTER — Ambulatory Visit (INDEPENDENT_AMBULATORY_CARE_PROVIDER_SITE_OTHER): Payer: Medicare Other | Admitting: Sports Medicine

## 2017-09-11 ENCOUNTER — Ambulatory Visit (INDEPENDENT_AMBULATORY_CARE_PROVIDER_SITE_OTHER): Payer: Medicare Other

## 2017-09-11 VITALS — BP 153/75 | HR 92

## 2017-09-11 DIAGNOSIS — M1990 Unspecified osteoarthritis, unspecified site: Secondary | ICD-10-CM

## 2017-09-11 DIAGNOSIS — M79671 Pain in right foot: Secondary | ICD-10-CM

## 2017-09-11 DIAGNOSIS — M109 Gout, unspecified: Secondary | ICD-10-CM

## 2017-09-11 MED ORDER — COLCHICINE 0.6 MG PO TABS: 0.6000 mg | ORAL_TABLET | Freq: Every day | ORAL | 0 refills | Status: DC

## 2017-09-11 NOTE — Patient Instructions (Signed)

## 2017-09-11 NOTE — Progress Notes (Signed)
Subjective: Karen West is a 77 y.o. female patient who presents to office for evaluation of Right> Left foot pain. Patient complains of progressive pain especially over the last 1-2 weeks in the Right>Left foot at the big toe joint. Ranks pain 7/10. Admits to warmth, redness, and swelling to the area that is unrelieved. Patient has tried rest with no relief in symptoms. Admits to change in diet. Patient denies any other pedal complaints.   Patient Active Problem List   Diagnosis Date Noted  . Adenocarcinoma of left lung, stage 1 ([place]) 07/26/2017  . Iron deficiency anemia 07/26/2017  . S/P lobectomy of lung   . Long QT interval   . Sinus tachycardia   . Abnormal positron emission tomography (PET) scan 05/06/2017  . Hyperthyroidism 10/05/2011  . Chest pain at rest 10/04/2011  . Angina at rest [facility]) 10/04/2011  . Headache(784.0) 10/04/2011  . FH: MI (myocardial infarction) 10/04/2011  . Chest pressure 10/04/2011  . Hyperlipidemia   . Hypertension   . OSA on CPAP   . Breast cancer ([place])   . Chronic anemia   . GERD (gastroesophageal reflux disease)   . Obesity   . Osteoarthritis   . Right bundle branch block   . Atherosclerosis of aorta ([place])   . Multiple thyroid nodules   . Type II or unspecified type diabetes mellitus without mention of complication, uncontrolled   . Chronic headaches   . OBESITY 11/17/2009  . OBSTRUCTIVE SLEEP APNEA 01/05/2009  . DIABETES, TYPE 2 11/12/2008  . HYPERLIPIDEMIA 11/12/2008  . HYPERTENSION 11/12/2008  . Lung nodule 11/12/2008  . G E R D 11/12/2008  . UNSPECIFIED DISORDER OF KIDNEY AND URETER 11/12/2008  . OSTEOARTHRITIS 11/12/2008    Current Outpatient Medications on File Prior to Visit  Medication Sig Dispense Refill  . acetaminophen (TYLENOL) 500 MG tablet Take 1,000 mg by mouth every 6 (six) hours as needed for moderate pain or headache.    Marland Kitchen amLODipine (NORVASC) 10 MG tablet Take 10 mg by mouth daily.    Marland Kitchen aspirin 81 MG tablet Take 81  mg by mouth daily.      Marland Kitchen ezetimibe (ZETIA) 10 MG tablet Take 5 mg by mouth daily.     . ferrous sulfate 325 (65 FE) MG EC tablet Take 1 tablet (325 mg total) by mouth 3 (three) times daily with meals. 90 tablet 2  . glipiZIDE (GLUCOTROL) 10 MG 24 hr tablet Take 10 mg by mouth 2 (two) times daily.      Marland Kitchen ibuprofen (ADVIL,MOTRIN) 200 MG tablet Take 400 mg by mouth every 6 (six) hours as needed.    Marland Kitchen levothyroxine (SYNTHROID, LEVOTHROID) 88 MCG tablet Take 88 mcg by mouth daily.  5  . magnesium oxide (MAG-OX) 400 (241.3 Mg) MG tablet Take 2 tablets (800 mg total) by mouth 2 (two) times daily. 60 tablet 1  . metFORMIN (GLUCOPHAGE) 1000 MG tablet Take 1,000 mg by mouth 2 (two) times daily.     . Omega-3 Fatty Acids (FISH OIL) 1200 MG CAPS Take 1,200 mg by mouth 2 (two) times daily.     Marland Kitchen omeprazole (PRILOSEC) 20 MG capsule Take 20 mg by mouth 2 (two) times daily.     . rosuvastatin (CRESTOR) 20 MG tablet Take 20 mg by mouth daily.     No current facility-administered medications on file prior to visit.     Allergies  Allergen Reactions  . Tramadol Nausea And Vomiting and Other (See Comments)    "got  sick to my stomach and was throwing up blood; it put me in the hospital")  . Lisinopril Rash and Other (See Comments)    Renal failure   . Tapazole [Methimazole] Itching and Rash    Objective:  General: Alert and oriented x3 in no acute distress  Dermatology: Focal Swelling, warmth, redness present on the Right 1ST MTPJ, No open lesions bilateral lower extremities, no webspace macerations, no ecchymosis bilateral, all nails x 10 are short and thick but well manicured.  Vascular: Dorsalis Pedis and Posterior Tibial pedal pulses 1/4, Capillary Fill Time 3 seconds,(+) pedal hair growth bilateral,Temperature gradient increased over the Right 1st MTPJ.   Neurology: Gross sensation intact via light touch bilateral.   Musculoskeletal: There is tenderness with palpation at 1st MTPJ on Right>Left  foot,No pain with calf compression bilateral. All joint range of motion is within normal limits except at the right 1st MTPJ where there is pain and limiation, + pes planus, Strength within normal limits in all groups bilateral.   Gait: Unassisted, Antalgic gait avoiding weight on right foot  Xrays  Right Foot    Impression: mild soft tissue swelling at 1st MTPJ. Diffuse arthritis. No other acute findings.        Assessment and Plan: Problem List Items Addressed This Visit    None    Visit Diagnoses    Right foot pain    -  Primary   Gout, arthritis       Relevant Medications   colchicine 0.6 MG tablet   Other Relevant Orders   ANA, IFA Comprehensive Panel   C-reactive protein   Rheumatoid factor   Sedimentation rate   Uric acid   CBC with Differential (Completed)   Basic Metabolic Panel       -Complete examination performed -Xrays reviewed -Discussed treatement options for gouty arthritis and gout education provided - Patient declined steroid injection -Rx Colchicine 0.6mg  -Ordered arthritic lab panel; will call patient with results if abnormal -Advised patient to call if symptoms are not improved within 1 week -Patient to return after blood work for further discussion for long term management of gout or sooner if condition worsens.  Landis Martins, DPM

## 2017-09-13 LAB — CBC WITH DIFFERENTIAL/PLATELET
Basophils Absolute: 20 cells/uL (ref 0–200)
Basophils Relative: 0.3 %
Eosinophils Absolute: 127 cells/uL (ref 15–500)
Eosinophils Relative: 1.9 %
HCT: 30.4 % — ABNORMAL LOW (ref 35.0–45.0)
Hemoglobin: 9.3 g/dL — ABNORMAL LOW (ref 11.7–15.5)
Lymphs Abs: 1494 cells/uL (ref 850–3900)
MCH: 24.7 pg — ABNORMAL LOW (ref 27.0–33.0)
MCHC: 30.6 g/dL — ABNORMAL LOW (ref 32.0–36.0)
MCV: 80.6 fL (ref 80.0–100.0)
MPV: 10 fL (ref 7.5–12.5)
Monocytes Relative: 9.4 %
Neutro Abs: 4429 cells/uL (ref 1500–7800)
Neutrophils Relative %: 66.1 %
Platelets: 380 10*3/uL (ref 140–400)
RBC: 3.77 10*6/uL — ABNORMAL LOW (ref 3.80–5.10)
RDW: 18.4 % — ABNORMAL HIGH (ref 11.0–15.0)
Total Lymphocyte: 22.3 %
WBC mixed population: 630 cells/uL (ref 200–950)
WBC: 6.7 10*3/uL (ref 3.8–10.8)

## 2017-09-13 LAB — ANA, IFA COMPREHENSIVE PANEL
Anti Nuclear Antibody(ANA): POSITIVE — AB
ENA SM Ab Ser-aCnc: <1 AI
SM/RNP: <1 AI
SSA (Ro) (ENA) Antibody, IgG: <1 AI
SSB (La) (ENA) Antibody, IgG: <1 AI
Scleroderma (Scl-70) (ENA) Antibody, IgG: <1 AI
ds DNA Ab: <1 IU/mL

## 2017-09-13 LAB — BASIC METABOLIC PANEL
BUN/Creatinine Ratio: 12 (calc) (ref 6–22)
BUN: 14 mg/dL (ref 7–25)
CO2: 23 mmol/L (ref 20–32)
Calcium: 8.5 mg/dL — ABNORMAL LOW (ref 8.6–10.4)
Chloride: 100 mmol/L (ref 98–110)
Creat: 1.16 mg/dL — ABNORMAL HIGH (ref 0.60–0.93)
Glucose, Bld: 131 mg/dL (ref 65–139)
Potassium: 3.8 mmol/L (ref 3.5–5.3)
Sodium: 137 mmol/L (ref 135–146)

## 2017-09-13 LAB — SEDIMENTATION RATE: Sed Rate: 75 mm/h — ABNORMAL HIGH (ref 0–30)

## 2017-09-13 LAB — RHEUMATOID FACTOR: Rhuematoid fact SerPl-aCnc: <14 IU/mL (ref <14)

## 2017-09-13 LAB — ANTI-NUCLEAR AB-TITER (ANA TITER): ANA Titer 1: 1:80 {titer} — ABNORMAL HIGH

## 2017-09-13 LAB — URIC ACID: Uric Acid, Serum: 5.8 mg/dL (ref 2.5–7.0)

## 2017-09-13 LAB — C-REACTIVE PROTEIN: CRP: 17.1 mg/L — ABNORMAL HIGH (ref <8.0)

## 2017-09-14 ENCOUNTER — Telehealth: Payer: Self-pay | Admitting: *Deleted

## 2017-09-14 DIAGNOSIS — M79671 Pain in right foot: Secondary | ICD-10-CM

## 2017-09-14 DIAGNOSIS — M109 Gout, unspecified: Secondary | ICD-10-CM

## 2017-09-14 NOTE — Telephone Encounter (Signed)
-----   Message from Landis Martins, [place] sent at 09/14/2017  7:28 AM EDT ----- Tivis Ringer, Will you check on patient to see how she is doing?  Let her know that her blood work came back + for inflammation specifically related to arthritis. This is likely an auto-immune type of arthritis even possibly lupus or some other similar condition. If patient does not have a rheumatologist we should get her in to see one. Also if patient would like a copy of her blood work results we can mail to her and I would recommend patient to also discuss this with her PCP as well Thanks Dr. Cannon Kettle

## 2017-09-14 NOTE — Telephone Encounter (Signed)
Faxed required form, clinicals and demographics to [facility].

## 2017-09-14 NOTE — Telephone Encounter (Signed)
Mailed copy of labs to pt and faxed copy of labs to Dr. Deland Pretty.

## 2017-09-14 NOTE — Telephone Encounter (Signed)
I informed pt of Dr. Leeanne Rio review of results and recommendations. Pt asked if she needed to continue the medication given for the gout, she is seeing some improvement. I told pt to continue the colchicine to completion. Pt states she sees Dr. Deland Pretty and she would also like a copy of the labs.

## 2017-09-18 LAB — PULMONARY FUNCTION TEST
DL/VA % pred: 90 %
DL/VA: 4.12 ml/min/mmHg/L
DLCO cor % pred: 60 %
DLCO cor: 13.07 ml/min/mmHg
DLCO unc % pred: 57 %
DLCO unc: 12.47 ml/min/mmHg
FEF 25-75 Post: 2.11 L/sec
FEF 25-75 Pre: 1.71 L/sec
FEF2575-%Change-Post: 23 %
FEF2575-%Pred-Post: 153 %
FEF2575-%Pred-Pre: 123 %
FEV1-%Change-Post: 5 %
FEV1-%Pred-Post: 86 %
FEV1-%Pred-Pre: 82 %
FEV1-Post: 1.34 L
FEV1-Pre: 1.26 L
FEV1FVC-%Change-Post: 8 %
FEV1FVC-%Pred-Pre: 108 %
FEV6-%Change-Post: -2 %
FEV6-%Pred-Post: 78 %
FEV6-%Pred-Pre: 80 %
FEV6-Post: 1.49 L
FEV6-Pre: 1.53 L
FEV6FVC-%Change-Post: 0 %
FEV6FVC-%Pred-Post: 104 %
FEV6FVC-%Pred-Pre: 104 %
FVC-%Change-Post: -2 %
FVC-%Pred-Post: 74 %
FVC-%Pred-Pre: 76 %
FVC-Post: 1.49 L
FVC-Pre: 1.53 L
Post FEV1/FVC ratio: 90 %
Post FEV6/FVC ratio: 100 %
Pre FEV1/FVC ratio: 82 %
Pre FEV6/FVC Ratio: 100 %
RV % pred: 126 %
RV: 2.76 L
TLC % pred: 94 %
TLC: 4.47 L

## 2017-09-24 ENCOUNTER — Telehealth: Payer: Self-pay | Admitting: *Deleted

## 2017-09-24 DIAGNOSIS — M109 Gout, unspecified: Secondary | ICD-10-CM

## 2017-09-24 DIAGNOSIS — M79671 Pain in right foot: Secondary | ICD-10-CM

## 2017-09-24 NOTE — Telephone Encounter (Signed)
Pt states [facility] is too far away and would like to be referred to someone closer, Dr. Dora Sims. I told pt I would refer her today.

## 2017-09-24 NOTE — Telephone Encounter (Signed)
Pt states she is calling concerning the referral to rheumatology.

## 2017-09-26 ENCOUNTER — Encounter (HOSPITAL_COMMUNITY): Payer: Self-pay

## 2017-09-26 ENCOUNTER — Encounter (HOSPITAL_COMMUNITY)
Admission: RE | Admit: 2017-09-26 | Discharge: 2017-09-26 | Disposition: A | Discharge status: Home / Self Care | Payer: Medicare Other | Source: Ambulatory Visit | Attending: Internal Medicine | Admitting: Internal Medicine

## 2017-09-26 VITALS — BP 138/58 | HR 87 | Temp 98.6°F | Resp 20 | Ht 61.0 in | Wt 144.8 lb

## 2017-09-26 DIAGNOSIS — Z7982 Long term (current) use of aspirin: Secondary | ICD-10-CM | POA: Insufficient documentation

## 2017-09-26 DIAGNOSIS — I4510 Unspecified right bundle-branch block: Secondary | ICD-10-CM | POA: Diagnosis not present

## 2017-09-26 DIAGNOSIS — Z7984 Long term (current) use of oral hypoglycemic drugs: Secondary | ICD-10-CM | POA: Diagnosis not present

## 2017-09-26 DIAGNOSIS — J438 Other emphysema: Secondary | ICD-10-CM | POA: Insufficient documentation

## 2017-09-26 DIAGNOSIS — E0590 Thyrotoxicosis, unspecified without thyrotoxic crisis or storm: Secondary | ICD-10-CM | POA: Diagnosis not present

## 2017-09-26 DIAGNOSIS — Z923 Personal history of irradiation: Secondary | ICD-10-CM | POA: Insufficient documentation

## 2017-09-26 DIAGNOSIS — Z79899 Other long term (current) drug therapy: Secondary | ICD-10-CM | POA: Insufficient documentation

## 2017-09-26 DIAGNOSIS — E785 Hyperlipidemia, unspecified: Secondary | ICD-10-CM | POA: Insufficient documentation

## 2017-09-26 DIAGNOSIS — Z79890 Hormone replacement therapy (postmenopausal): Secondary | ICD-10-CM | POA: Diagnosis not present

## 2017-09-26 DIAGNOSIS — I2510 Atherosclerotic heart disease of native coronary artery without angina pectoris: Secondary | ICD-10-CM | POA: Diagnosis not present

## 2017-09-26 DIAGNOSIS — E042 Nontoxic multinodular goiter: Secondary | ICD-10-CM | POA: Diagnosis not present

## 2017-09-26 DIAGNOSIS — I10 Essential (primary) hypertension: Secondary | ICD-10-CM | POA: Diagnosis not present

## 2017-09-26 DIAGNOSIS — I700 Atherosclerosis of aorta: Secondary | ICD-10-CM | POA: Insufficient documentation

## 2017-09-26 DIAGNOSIS — G4733 Obstructive sleep apnea (adult) (pediatric): Secondary | ICD-10-CM | POA: Insufficient documentation

## 2017-09-26 DIAGNOSIS — J439 Emphysema, unspecified: Secondary | ICD-10-CM

## 2017-09-26 DIAGNOSIS — K219 Gastro-esophageal reflux disease without esophagitis: Secondary | ICD-10-CM | POA: Diagnosis not present

## 2017-09-26 NOTE — Progress Notes (Signed)
Karen West 77 y.o. female Pulmonary Rehab Orientation Note Patient arrived today in Cardiac and Pulmonary Rehab for orientation to Pulmonary Rehab. She was transported from [employer] via wheel chair. She does not carry portable oxygen. Per pt, she uses oxygen never. Color good, skin warm and dry. Patient is oriented to time and place. Patient's medical history, psychosocial health, and medications reviewed. Psychosocial assessment reveals pt lives alone. Pt is currently retired Press photographer. Pt hobbies includes  Church activiites. Pt reports her stress level is none. Pt does not exhibit signs of depression. Merrit shows good  coping skills with positive outlook.  Will continue to monitor and evaluate progress toward psychosocial goal(s) of feeling confident in exercise due to her level of deconditioning. Physical assessment reveals heart rate is normal, breath sounds clear to auscultation, no wheezes, rales, or rhonchi. Grip strength equal, strong. Distal pulses palpable.  Pt with rt foot discomfort that was thought to be gouty arthritis but subsequent lab results did not show gout.  Pt is being referred to rheumatologist Patient reports she does take medications as prescribed. Patient states she follows a Regular diet. The patient reports no specific efforts to gain or lose weight. pt reports her appetite is poor since her surgeries. pt has lost 30 plus pounds and would like to maintain her weight. Patient's weight will be monitored closely. Demonstration and practice of PLB using pulse oximeter. Patient able to return demonstration satisfactorily. Safety and hand hygiene in the exercise area reviewed with patient. Patient voices understanding of the information reviewed. Department expectations discussed with patient and achievable goals were set. The patient shows enthusiasm about attending the program and we look forward to working with this nice pt . The patient is scheduled for a 6 min walk test on  7/18 and to begin exercise on 7/25 at the 10:30 class. 45 minutes was spent on a variety of activities such as assessment of the patient, obtaining baseline data including height, weight, BMI, and grip strength, verifying medical history, allergies, and current medications, and teaching patient strategies for performing tasks with less respiratory effort with emphasis on pursed lip breathing. [place], BSN Cardiac and Pulmonary Rehab Nurse Navigator  .

## 2017-09-27 ENCOUNTER — Encounter (HOSPITAL_COMMUNITY): Payer: Self-pay | Admitting: *Deleted

## 2017-10-01 NOTE — Progress Notes (Signed)
Karen West 77 y.o. female  DOB: April 29, 1940 MRN: [id number]           Nutrition Note No diagnosis found. Past Medical History:  Diagnosis Date  . Atherosclerosis of aorta ([place])    a. 01/2017/03/2017 - noted on high res chest CTs.  . Breast cancer ([place])    a. Bilateral --> s/p left mastectomy  . Carotid artery disease ([place])    a. 04/9935 w/ 16-96% LICA stenosis and <78% RICA stenosis; b. 10/2015 Carotid U/S: < 50% BICA stenosis  . Chest pain    a. 09/2011 MV: EF 68%, no ischemia/infarct.  . Chronic anemia   . Chronic headaches    denies  . Coronary artery calcification seen on CT scan    a. 01/2017 High res CT: atherosclerotic calcification of the arterial vascularture, including severe involvement of the coronary arteries; b. 03/2017 CT Chest: coronary and Ao atheroscelrosis.  Marland Kitchen GERD (gastroesophageal reflux disease)   . History of echocardiogram    a. 09/2011 Echo: EF 55-60%, no rwma, triv AI, PASP 29mmHg.  Marland Kitchen Hyperlipidemia   . Hypertension   . Hyperthyroidism   . Left upper lobe pulmonary nodule    a. 02/2017 PET: slowing enlarging 1.7cm LUL nodule w/ low-grade metabolic activity; b. 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 05/2017 s/p VATS.  . Multinodular goiter    a. 02/2017 PET scan- Hypermetabolic nodule;  b. 03/7508 s/p thyroidectomy  . Obesity   . OSA on CPAP    cpap  . Osteoarthritis   . Personal history of radiation therapy 1999  . Right bundle branch block   . Type II or unspecified type diabetes mellitus without mention of complication, uncontrolled    Meds reviewed. Metformin, zetia, glipizide, crestor noted  Ht: Ht Readings from Last 1 Encounters:  09/26/17 5\' 1"  (1.549 m)     Wt:  Wt Readings from Last 3 Encounters:  09/26/17 144 lb 13.5 oz (65.7 kg)  07/26/17 145 lb (65.8 kg)  07/25/17 142 lb (64.4 kg)     BMI: 27.37   Current tobacco use? No     Labs:  Lipid Panel     Component Value Date/Time   CHOL 141 10/05/2011 0530   TRIG 137  10/05/2011 0530   HDL 49 10/05/2011 0530   CHOLHDL 2.9 10/05/2011 0530   VLDL 27 10/05/2011 0530   LDLCALC 65 10/05/2011 0530    Lab Results  Component Value Date   HGBA1C 6.9 (H) 05/08/2017    Nutrition Diagnosis  ? Food-and nutrition-related knowledge deficit related to lack of exposure to information as related to diagnosis of pulmonary disease ? Overweight/obesity related to excessive energy intake as evidenced by a BMI of 27.37   Goal(s)  1. The pt will recognize symptoms that can interfere with adequate oral intake, such as shortness of breath, N/V, early satiety, fatigue, ability to secure and prepare food, taste and smell changes, chewing/swallowing difficulties, and/ or pain when eating. 2. The pt will consume high-energy, high-nutrient dense beverages when necessary to compensate for decreased oral intake of solid foods.   Plan:  Pt to attend Pulmonary Nutrition class Will provide client-centered nutrition education as part of interdisciplinary care.   Monitor and evaluate progress toward nutrition goal with team.  Monitor and Evaluate progress toward nutrition goal with team.   Laurina Bustle, MS, RD, LDN 10/01/2017 1:38 PM

## 2017-10-02 ENCOUNTER — Ambulatory Visit (INDEPENDENT_AMBULATORY_CARE_PROVIDER_SITE_OTHER): Payer: Medicare Other | Admitting: Sports Medicine

## 2017-10-02 ENCOUNTER — Encounter: Payer: Self-pay | Admitting: Sports Medicine

## 2017-10-02 DIAGNOSIS — R52 Pain, unspecified: Secondary | ICD-10-CM

## 2017-10-02 DIAGNOSIS — N8189 Other female genital prolapse: Secondary | ICD-10-CM | POA: Insufficient documentation

## 2017-10-02 DIAGNOSIS — R1032 Left lower quadrant pain: Secondary | ICD-10-CM | POA: Insufficient documentation

## 2017-10-02 DIAGNOSIS — M25571 Pain in right ankle and joints of right foot: Secondary | ICD-10-CM | POA: Diagnosis not present

## 2017-10-02 DIAGNOSIS — R351 Nocturia: Secondary | ICD-10-CM | POA: Insufficient documentation

## 2017-10-02 DIAGNOSIS — R102 Pelvic and perineal pain: Secondary | ICD-10-CM | POA: Insufficient documentation

## 2017-10-02 DIAGNOSIS — R110 Nausea: Secondary | ICD-10-CM | POA: Insufficient documentation

## 2017-10-02 DIAGNOSIS — M8580 Other specified disorders of bone density and structure, unspecified site: Secondary | ICD-10-CM | POA: Insufficient documentation

## 2017-10-02 DIAGNOSIS — R319 Hematuria, unspecified: Secondary | ICD-10-CM | POA: Insufficient documentation

## 2017-10-02 NOTE — Patient Instructions (Signed)
Aspercreme spray for toe joint pain

## 2017-10-02 NOTE — Progress Notes (Signed)
Subjective: Karen West is a 77 y.o. female patient who presents to office for follow up evaluation of Right big toe joint pain. Patient is here to discuss lab results and pain to toe joint. Patient states that pain is slowly getting better, no complete relief with colchicine. Patient denies nausea, fever, chills, night sweats.   Patient Active Problem List   Diagnosis Date Noted  . Pelvic floor relaxation 10/02/2017  . Blood in urine 10/02/2017  . Left lower quadrant pain 10/02/2017  . Nausea 10/02/2017  . Nocturia 10/02/2017  . Osteopenia 10/02/2017  . Pain in pelvis 10/02/2017  . Adenocarcinoma of left lung, stage 1 ([place]) 07/26/2017  . Iron deficiency anemia 07/26/2017  . S/P lobectomy of lung   . Long QT interval   . Sinus tachycardia   . Abnormal positron emission tomography (PET) scan 05/06/2017  . Pain of left shoulder joint on movement 04/09/2017  . Rib pain 04/09/2017  . BPPV (benign paroxysmal positional vertigo), left 03/27/2017  . Lipoma 04/06/2016  . Hypersomnia 01/18/2016  . Upper airway resistance syndrome 01/18/2016  . Hyperthyroidism 10/05/2011  . Chest pain at rest 10/04/2011  . Angina at rest [facility]) 10/04/2011  . Headache(784.0) 10/04/2011  . FH: MI (myocardial infarction) 10/04/2011  . Chest pressure 10/04/2011  . Hyperlipidemia   . Hypertension   . OSA on CPAP   . Breast cancer ([place])   . Chronic anemia   . GERD (gastroesophageal reflux disease)   . Obesity   . Osteoarthritis   . Right bundle branch block   . Atherosclerosis of aorta ([place])   . Multiple thyroid nodules   . Type II or unspecified type diabetes mellitus without mention of complication, uncontrolled   . Chronic headaches   . OBESITY 11/17/2009  . OBSTRUCTIVE SLEEP APNEA 01/05/2009  . DIABETES, TYPE 2 11/12/2008  . HYPERLIPIDEMIA 11/12/2008  . HYPERTENSION 11/12/2008  . Lung nodule 11/12/2008  . G E R D 11/12/2008  . UNSPECIFIED DISORDER OF KIDNEY AND URETER 11/12/2008  .  OSTEOARTHRITIS 11/12/2008    Current Outpatient Medications on File Prior to Visit  Medication Sig Dispense Refill  . acetaminophen (TYLENOL) 500 MG tablet Take 1,000 mg by mouth every 6 (six) hours as needed for moderate pain or headache.    Marland Kitchen amLODipine (NORVASC) 10 MG tablet Take 10 mg by mouth daily.    Marland Kitchen aspirin 81 MG tablet Take 81 mg by mouth daily.      Marland Kitchen ezetimibe (ZETIA) 10 MG tablet Take 5 mg by mouth daily.     . ferrous sulfate 325 (65 FE) MG EC tablet Take 1 tablet (325 mg total) by mouth 3 (three) times daily with meals. 90 tablet 2  . glipiZIDE (GLUCOTROL) 10 MG 24 hr tablet Take 10 mg by mouth 2 (two) times daily.      . hydrocortisone cream 1 % Apply 1 application topically 2 (two) times daily.    Marland Kitchen levothyroxine (SYNTHROID, LEVOTHROID) 88 MCG tablet Take 88 mcg by mouth daily.  5  . magnesium oxide (MAG-OX) 400 (241.3 Mg) MG tablet Take 2 tablets (800 mg total) by mouth 2 (two) times daily. 60 tablet 1  . metFORMIN (GLUCOPHAGE) 1000 MG tablet Take 1,000 mg by mouth 2 (two) times daily.     . Omega-3 Fatty Acids (FISH OIL) 1200 MG CAPS Take 1,200 mg by mouth 2 (two) times daily.     Marland Kitchen omeprazole (PRILOSEC) 20 MG capsule Take 20 mg by mouth 2 (two) times  daily. Takes prn    . rosuvastatin (CRESTOR) 20 MG tablet Take 20 mg by mouth daily.     No current facility-administered medications on file prior to visit.     Allergies  Allergen Reactions  . Tramadol Nausea And Vomiting and Other (See Comments)    "got sick to my stomach and was throwing up blood; it put me in the hospital")  . Lisinopril Rash and Other (See Comments)    Renal failure   . Tapazole [Methimazole] Itching and Rash    Objective:  General: Alert and oriented x3 in no acute distress  Dermatology: Focal Swelling, warmth, redness present on the Right 1ST MTPJ, No open lesions bilateral lower extremities, no webspace macerations, no ecchymosis bilateral, all nails x 10 are short and thick but well  manicured.  Vascular: Dorsalis Pedis and Posterior Tibial pedal pulses 1/4, Capillary Fill Time 3 seconds,(+) pedal hair growth bilateral,Temperature gradient increased over the Right 1st MTPJ.   Neurology: Gross sensation intact via light touch bilateral.   Musculoskeletal: There is decreased tenderness with palpation at 1st MTPJ on Right>Left foot,No pain with calf compression bilateral. All joint range of motion is within normal limits except at the right 1st MTPJ where there is pain and limiation, + pes planus, Strength within normal limits in all groups bilateral.        Assessment and Plan: Problem List Items Addressed This Visit    None    Visit Diagnoses    Toe joint pain, right    -  Primary   Inflammatory pain           -Complete examination performed -Arthritic panel reviewed -Discussed treatement options for inflammatory joint pain -Patient to see rheumatology for further recs -Continue with topical pain cream/spray to toe joint -Return as scheduled for continued care  Landis Martins, DPM

## 2017-10-03 ENCOUNTER — Telehealth: Payer: Self-pay | Admitting: *Deleted

## 2017-10-03 NOTE — Telephone Encounter (Signed)
-----   Message from Landis Martins, [place] sent at 10/02/2017 10:46 PM EDT ----- Regarding: Rheumatology Consult Patient states that she hasn't heard any info from Dr. Luanna Salk office. Please help patient with getting appt/follow up Thanks Dr. Cannon Kettle

## 2017-10-03 NOTE — Telephone Encounter (Signed)
I informed pt of the acceptance of the referral and to expect a call today or tomorrow.

## 2017-10-03 NOTE — Telephone Encounter (Signed)
[facility] receptionist states pt's referral just came up in their system yesterday as a yes to accept the referral and they will be contacting pt today or tomorrow to schedule.

## 2017-10-04 ENCOUNTER — Encounter (HOSPITAL_COMMUNITY)
Admission: RE | Admit: 2017-10-04 | Discharge: 2017-10-04 | Disposition: A | Discharge status: Home / Self Care | Payer: Medicare Other | Source: Ambulatory Visit | Attending: Internal Medicine | Admitting: Internal Medicine

## 2017-10-04 DIAGNOSIS — E785 Hyperlipidemia, unspecified: Secondary | ICD-10-CM | POA: Diagnosis not present

## 2017-10-04 DIAGNOSIS — I10 Essential (primary) hypertension: Secondary | ICD-10-CM | POA: Diagnosis not present

## 2017-10-04 DIAGNOSIS — I700 Atherosclerosis of aorta: Secondary | ICD-10-CM | POA: Diagnosis not present

## 2017-10-04 DIAGNOSIS — K219 Gastro-esophageal reflux disease without esophagitis: Secondary | ICD-10-CM | POA: Diagnosis not present

## 2017-10-04 DIAGNOSIS — J439 Emphysema, unspecified: Secondary | ICD-10-CM

## 2017-10-04 DIAGNOSIS — J438 Other emphysema: Secondary | ICD-10-CM | POA: Diagnosis not present

## 2017-10-04 DIAGNOSIS — I2510 Atherosclerotic heart disease of native coronary artery without angina pectoris: Secondary | ICD-10-CM | POA: Diagnosis not present

## 2017-10-05 NOTE — Progress Notes (Deleted)
Office Visit Note  Patient: Karen West             Date of Birth: 01-Nov-1940           MRN: [id number]             PCP: Deland Pretty, MD Referring: Deland Pretty, MD Visit Date: 10/18/2017 Occupation: @GUAROCC @  Subjective:  No chief complaint on file.   History of Present Illness: Karen West is a 77 y.o. female ***   Activities of Daily Living:  Patient reports morning stiffness for *** {minute/hour:19697}.   Patient {ACTIONS;DENIES/REPORTS:21021675::"Denies"} nocturnal pain.  Difficulty dressing/grooming: {ACTIONS;DENIES/REPORTS:21021675::"Denies"} Difficulty climbing stairs: {ACTIONS;DENIES/REPORTS:21021675::"Denies"} Difficulty getting out of chair: {ACTIONS;DENIES/REPORTS:21021675::"Denies"} Difficulty using hands for taps, buttons, cutlery, and/or writing: {ACTIONS;DENIES/REPORTS:21021675::"Denies"}  No Rheumatology ROS completed.   PMFS History:  Patient Active Problem List   Diagnosis Date Noted  . Pelvic floor relaxation 10/02/2017  . Blood in urine 10/02/2017  . Left lower quadrant pain 10/02/2017  . Nausea 10/02/2017  . Nocturia 10/02/2017  . Osteopenia 10/02/2017  . Pain in pelvis 10/02/2017  . Adenocarcinoma of left lung, stage 1 ([place]) 07/26/2017  . Iron deficiency anemia 07/26/2017  . S/P lobectomy of lung   . Long QT interval   . Sinus tachycardia   . Abnormal positron emission tomography (PET) scan 05/06/2017  . Pain of left shoulder joint on movement 04/09/2017  . Rib pain 04/09/2017  . BPPV (benign paroxysmal positional vertigo), left 03/27/2017  . Lipoma 04/06/2016  . Hypersomnia 01/18/2016  . Upper airway resistance syndrome 01/18/2016  . Hyperthyroidism 10/05/2011  . Chest pain at rest 10/04/2011  . Angina at rest [facility]) 10/04/2011  . Headache(784.0) 10/04/2011  . FH: MI (myocardial infarction) 10/04/2011  . Chest pressure 10/04/2011  . Hyperlipidemia   . Hypertension   . OSA on CPAP   . Breast cancer ([place])   . Chronic anemia    . GERD (gastroesophageal reflux disease)   . Obesity   . Osteoarthritis   . Right bundle branch block   . Atherosclerosis of aorta ([place])   . Multiple thyroid nodules   . Type II or unspecified type diabetes mellitus without mention of complication, uncontrolled   . Chronic headaches   . OBESITY 11/17/2009  . OBSTRUCTIVE SLEEP APNEA 01/05/2009  . DIABETES, TYPE 2 11/12/2008  . HYPERLIPIDEMIA 11/12/2008  . HYPERTENSION 11/12/2008  . Lung nodule 11/12/2008  . G E R D 11/12/2008  . UNSPECIFIED DISORDER OF KIDNEY AND URETER 11/12/2008  . OSTEOARTHRITIS 11/12/2008    Past Medical History:  Diagnosis Date  . Atherosclerosis of aorta ([place])    a. 01/2017/03/2017 - noted on high res chest CTs.  . Breast cancer ([place])    a. Bilateral --> s/p left mastectomy  . Carotid artery disease ([place])    a. 04/7033 w/ 00-93% LICA stenosis and <81% RICA stenosis; b. 10/2015 Carotid U/S: < 50% BICA stenosis  . Chest pain    a. 09/2011 MV: EF 68%, no ischemia/infarct.  . Chronic anemia   . Chronic headaches    denies  . Coronary artery calcification seen on CT scan    a. 01/2017 High res CT: atherosclerotic calcification of the arterial vascularture, including severe involvement of the coronary arteries; b. 03/2017 CT Chest: coronary and Ao atheroscelrosis.  Marland Kitchen GERD (gastroesophageal reflux disease)   . History of echocardiogram    a. 09/2011 Echo: EF 55-60%, no rwma, triv AI, PASP 65mmHg.  Marland Kitchen Hyperlipidemia   . Hypertension   .  Hyperthyroidism   . Left upper lobe pulmonary nodule    a. 02/2017 PET: slowing enlarging 1.7cm LUL nodule w/ low-grade metabolic activity; b. 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 05/2017 s/p VATS.  . Multinodular goiter    a. 02/2017 PET scan- Hypermetabolic nodule;  b. 04/6710 s/p thyroidectomy  . Obesity   . OSA on CPAP    cpap  . Osteoarthritis   . Personal history of radiation therapy 1999  . Right bundle branch block   . Type II or unspecified type diabetes  mellitus without mention of complication, uncontrolled     Family History  Problem Relation Age of Onset  . Diabetes Brother        x3  . Hypertension Brother        x3  . Diabetes Brother   . Stroke Brother   . Heart attack Mother 28       Mother Died of MI age 40  . Diabetes Sister   . Stroke Sister    Past Surgical History:  Procedure Laterality Date  . BREAST BIOPSY  1993; 1995; 2000   left; right; left  . BREAST EXCISIONAL BIOPSY Right pt unsure  . MASTECTOMY Left 2000   left  . THYROIDECTOMY  05/10/2017   VIDEO BRONCHOSCOPY WITH ENDOBRONCHIAL NAVIGATION (N/A)  . THYROIDECTOMY N/A 05/10/2017   Procedure: TOTAL THYROIDECTOMY;  Surgeon: Armandina Gemma, MD;  Location: [place];  Service: General;  Laterality: N/A;  . VIDEO ASSISTED THORACOSCOPY (VATS)/ LOBECTOMY Left 06/04/2017   Procedure: VIDEO ASSISTED THORACOSCOPY (VATS)/LEFT UPPER LOBECTOMY;  Surgeon: [name], MD;  Location: [place];  Service: Thoracic;  Laterality: Left;  Marland Kitchen VIDEO BRONCHOSCOPY WITH ENDOBRONCHIAL NAVIGATION N/A 05/10/2017   Procedure: VIDEO BRONCHOSCOPY WITH ENDOBRONCHIAL NAVIGATION;  Surgeon: [name], MD;  Location: [place];  Service: Thoracic;  Laterality: N/A;  . VIDEO BRONCHOSCOPY WITH ENDOBRONCHIAL ULTRASOUND N/A 06/04/2017   Procedure: VIDEO BRONCHOSCOPY WITH ENDOBRONCHIAL ULTRASOUND;  Surgeon: [name], MD;  Location: [place];  Service: Thoracic;  Laterality: N/A;   Social History   Social History Narrative   Lives alone.  Four children.     Objective: Vital Signs: There were no vitals taken for this visit.   Physical Exam   Musculoskeletal Exam: ***  CDAI Exam: No CDAI exam completed.   Investigation: Findings:  09/11/17: ANA 1:80 Homogeneous, dsDNA <1, Scl-70 negative, smith negative, RNP negative, Ro-, La-, CRP 17.1, RF<14, Sed rate 75, Uric acid 5.8    Component     Latest Ref Rng & Units 09/11/2017  ANA Pattern 1      HOMOGENEOUS (A)  ANA Titer 1      titer 1:80 (H)   Component     Latest Ref Rng & Units 09/11/2017  Anti Nuclear Antibody(ANA)     NEGATIVE POSITIVE (A)  ds DNA Ab     IU/mL <1  Scleroderma (Scl-70) (ENA) Antibody, IgG     <1.0 NEG AI <1.0 NEG  ENA SM Ab Ser-aCnc     <1.0 NEG AI <1.0 NEG  SM/RNP     <1.0 NEG AI <1.0 NEG  SSA (Ro) (ENA) Antibody, IgG     <1.0 NEG AI <1.0 NEG  SSB (La) (ENA) Antibody, IgG     <1.0 NEG AI <1.0 NEG  CRP     <8.0 mg/L 17.1 (H)  RA Latex Turbid.     <14 IU/mL <14  Sed Rate     0 - 30 mm/h 75 (H)  Uric  Acid, Serum     2.5 - 7.0 mg/dL 5.8   Imaging: Dg Foot Complete Right  Result Date: 09/11/2017 Please see detailed radiograph report in office note.   Recent Labs: Lab Results  Component Value Date   WBC 6.7 09/11/2017   HGB 9.3 (L) 09/11/2017   PLT 380 09/11/2017   NA 137 09/11/2017   K 3.8 09/11/2017   CL 100 09/11/2017   CO2 23 09/11/2017   GLUCOSE 131 09/11/2017   BUN 14 09/11/2017   CREATININE 1.16 (H) 09/11/2017   BILITOT 0.3 07/26/2017   ALKPHOS 78 07/26/2017   AST 15 07/26/2017   ALT 12 07/26/2017   PROT 7.9 07/26/2017   ALBUMIN 3.9 07/26/2017   CALCIUM 8.5 (L) 09/11/2017   GFRAA 32 (L) 07/26/2017    Speciality Comments: No specialty comments available.  Procedures:  No procedures performed Allergies: Tramadol; Lisinopril; and Tapazole [methimazole]   Assessment / Plan:     Visit Diagnoses: Pain in right foot  History of gout  Right bundle branch block  Essential hypertension  Atherosclerosis of aorta ([facility])  OSA on CPAP  Adenocarcinoma of left lung, stage 1 ([facility])  S/P lobectomy of lung  Multiple thyroid nodules  Hyperthyroidism  BPPV (benign paroxysmal positional vertigo), left  Osteopenia, unspecified location  History of diabetes mellitus, type II  History of gastroesophageal reflux (GERD)  History of hyperlipidemia  History of anemia   Orders: No orders of the defined types were placed in this encounter.  No orders of  the defined types were placed in this encounter.   Face-to-face time spent with patient was *** minutes. Greater than 50% of time was spent in counseling and coordination of care.  Follow-Up Instructions: No follow-ups on file.   Ofilia Neas, PA-C  Note - This record has been created using Dragon software.  Chart creation errors have been sought, but may not always  have been located. Such creation errors do not reflect on  the standard of medical care.

## 2017-10-05 NOTE — Progress Notes (Signed)
Pulmonary Individual Treatment Plan  Patient Details  Name: Karen West MRN: [id number] Date of Birth: [birth year] Referring Provider:     Pulmonary Rehab Walk Test from 10/04/2017 in [place]  Referring Provider  Dr. Chase Caller      Initial Encounter Date:    Pulmonary Rehab Walk Test from 10/04/2017 in [place]  Date  10/04/17      Visit Diagnosis: Pulmonary emphysema, unspecified emphysema type ([place])  Patient's Home Medications on Admission:   Current Outpatient Medications:  .  acetaminophen (TYLENOL) 500 MG tablet, Take 1,000 mg by mouth every 6 (six) hours as needed for moderate pain or headache., Disp: , Rfl:  .  amLODipine (NORVASC) 10 MG tablet, Take 10 mg by mouth daily., Disp: , Rfl:  .  aspirin 81 MG tablet, Take 81 mg by mouth daily.  , Disp: , Rfl:  .  ezetimibe (ZETIA) 10 MG tablet, Take 5 mg by mouth daily. , Disp: , Rfl:  .  ferrous sulfate 325 (65 FE) MG EC tablet, Take 1 tablet (325 mg total) by mouth 3 (three) times daily with meals., Disp: 90 tablet, Rfl: 2 .  glipiZIDE (GLUCOTROL) 10 MG 24 hr tablet, Take 10 mg by mouth 2 (two) times daily.  , Disp: , Rfl:  .  hydrocortisone cream 1 %, Apply 1 application topically 2 (two) times daily., Disp: , Rfl:  .  levothyroxine (SYNTHROID, LEVOTHROID) 88 MCG tablet, Take 88 mcg by mouth daily., Disp: , Rfl: 5 .  magnesium oxide (MAG-OX) 400 (241.3 Mg) MG tablet, Take 2 tablets (800 mg total) by mouth 2 (two) times daily., Disp: 60 tablet, Rfl: 1 .  metFORMIN (GLUCOPHAGE) 1000 MG tablet, Take 1,000 mg by mouth 2 (two) times daily. , Disp: , Rfl:  .  Omega-3 Fatty Acids (FISH OIL) 1200 MG CAPS, Take 1,200 mg by mouth 2 (two) times daily. , Disp: , Rfl:  .  omeprazole (PRILOSEC) 20 MG capsule, Take 20 mg by mouth 2 (two) times daily. Takes prn, Disp: , Rfl:  .  rosuvastatin (CRESTOR) 20 MG tablet, Take 20 mg by mouth daily., Disp: , Rfl:   Past Medical  History: Past Medical History:  Diagnosis Date  . Atherosclerosis of aorta ([place])    a. 01/2017/03/2017 - noted on high res chest CTs.  . Breast cancer ([place])    a. Bilateral --> s/p left mastectomy  . Carotid artery disease ([place])    a. 08/735 w/ 10-62% LICA stenosis and <69% RICA stenosis; b. 10/2015 Carotid U/S: < 50% BICA stenosis  . Chest pain    a. 09/2011 MV: EF 68%, no ischemia/infarct.  . Chronic anemia   . Chronic headaches    denies  . Coronary artery calcification seen on CT scan    a. 01/2017 High res CT: atherosclerotic calcification of the arterial vascularture, including severe involvement of the coronary arteries; b. 03/2017 CT Chest: coronary and Ao atheroscelrosis.  Marland Kitchen GERD (gastroesophageal reflux disease)   . History of echocardiogram    a. 09/2011 Echo: EF 55-60%, no rwma, triv AI, PASP 39mmHg.  Marland Kitchen Hyperlipidemia   . Hypertension   . Hyperthyroidism   . Left upper lobe pulmonary nodule    a. 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 05/2017 s/p VATS.  . Multinodular goiter    a. 02/2017 PET scan- Hypermetabolic nodule;  b. 04/7033 s/p thyroidectomy  .  Obesity   . OSA on CPAP    cpap  . Osteoarthritis   . Personal history of radiation therapy 1999  . Right bundle branch block   . Type II or unspecified type diabetes mellitus without mention of complication, uncontrolled     Tobacco Use: Social History   Tobacco Use  Smoking Status Never Smoker  Smokeless Tobacco Never Used    Labs: Recent Review Flowsheet Data    Labs for ITP Cardiac and Pulmonary Rehab Latest Ref Rng & Units 10/04/2011 10/05/2011 05/08/2017 05/31/2017 06/05/2017   Cholestrol 0 - 200 mg/dL - 141 - - -   LDLCALC 0 - 99 mg/dL - 65 - - -   HDL >39 mg/dL - 49 - - -   Trlycerides <150 mg/dL - 137 - - -   Hemoglobin A1c 4.8 - 5.6 % 6.1(H) - 6.9(H) - -   PHART 7.350 - 7.450 - - - 7.465(H) 7.460(H)   PCO2ART 32.0 -  48.0 mmHg - - - 36.8 37.2   HCO3 20.0 - 28.0 mmol/L - - - 26.1 26.2   O2SAT % - - - 98.5 95.1      Capillary Blood Glucose: Lab Results  Component Value Date   GLUCAP 244 (H) 06/15/2017   GLUCAP 90 06/15/2017   GLUCAP 156 (H) 06/14/2017   GLUCAP 119 (H) 06/14/2017   GLUCAP 194 (H) 06/14/2017     Pulmonary Assessment Scores: Pulmonary Assessment Scores    Row Name 09/27/17 1110 10/05/17 0845       ADL UCSD   ADL Phase  Entry  Entry    SOB Score total  42  -      CAT Score   CAT Score  5  -      mMRC Score   mMRC Score  -  0       Pulmonary Function Assessment:   Exercise Target Goals: Date: 10/04/17  Exercise Program Goal: Individual exercise prescription set using results from initial 6 min walk test and THRR while considering  patient's activity barriers and safety.    Exercise Prescription Goal: Initial exercise prescription builds to 30-45 minutes a day of aerobic activity, 2-3 days per week.  Home exercise guidelines will be given to patient during program as part of exercise prescription that the participant will acknowledge.  Activity Barriers & Risk Stratification: Activity Barriers & Cardiac Risk Stratification - 09/26/17 1452      Activity Barriers & Cardiac Risk Stratification   Activity Barriers  Arthritis;Shortness of Breath;Incisional Pain;Deconditioning rt foot       6 Minute Walk: 6 Minute Walk    Row Name 10/05/17 0845         6 Minute Walk   Phase  Initial     Distance  720 feet     Walk Time  6 minutes     # of Rest Breaks  0     MPH  1.36     METS  2.07     RPE  11     Perceived Dyspnea   2     Symptoms  Yes (comment)     Comments  used wheelchair due to pain in foot 2/10 pain after ambulating     Resting HR  94 bpm     Resting BP  144/60     Resting Oxygen Saturation   96 %     Exercise Oxygen Saturation  during 6 min walk  96 %  Max Ex. HR  130 bpm     Max Ex. BP  144/64       Interval HR   1 Minute HR  113     2  Minute HR  123     3 Minute HR  123     4 Minute HR  130     5 Minute HR  129     6 Minute HR  123     2 Minute Post HR  109     Interval Heart Rate?  Yes       Interval Oxygen   Interval Oxygen?  Yes     Baseline Oxygen Saturation %  96 %     1 Minute Oxygen Saturation %  97 %     1 Minute Liters of Oxygen  0 L     2 Minute Oxygen Saturation %  96 %     2 Minute Liters of Oxygen  0 L     3 Minute Oxygen Saturation %  96 %     3 Minute Liters of Oxygen  0 L     4 Minute Oxygen Saturation %  96 %     4 Minute Liters of Oxygen  0 L     5 Minute Oxygen Saturation %  96 %     5 Minute Liters of Oxygen  0 L     6 Minute Oxygen Saturation %  96 %     6 Minute Liters of Oxygen  0 L     2 Minute Post Oxygen Saturation %  95 %     2 Minute Post Liters of Oxygen  0 L        Oxygen Initial Assessment: Oxygen Initial Assessment - 10/05/17 0844      Initial 6 min Walk   Oxygen Used  None      Program Oxygen Prescription   Program Oxygen Prescription  None       Oxygen Re-Evaluation:   Oxygen Discharge (Final Oxygen Re-Evaluation):   Initial Exercise Prescription: Initial Exercise Prescription - 10/05/17 0800      Date of Initial Exercise RX and Referring Provider   Date  10/04/17    Referring Provider  Dr. Chase Caller      NuStep   Level  2    SPM  80    Minutes  17    METs  1.5      Arm Ergometer   Level  1    Watts  10    Minutes  17      Track   Laps  8    Minutes  17      Prescription Details   Frequency (times per week)  2    Duration  Progress to 45 minutes of aerobic exercise without signs/symptoms of physical distress      Intensity   THRR 40-80% of Max Heartrate  58-116    Ratings of Perceived Exertion  11-13    Perceived Dyspnea  0-4      Progression   Progression  Continue progressive overload as per policy without signs/symptoms or physical distress.      Resistance Training   Training Prescription  Yes    Weight  orange bands    Reps   10-15       Perform Capillary Blood Glucose checks as needed.  Exercise Prescription Changes:   Exercise Comments:   Exercise Goals and Review: Exercise Goals  [place] Name 09/26/17 1453             Exercise Goals   Increase Physical Activity  Yes       Intervention  Provide advice, education, support and counseling about physical activity/exercise needs.;Develop an individualized exercise prescription for aerobic and resistive training based on initial evaluation findings, risk stratification, comorbidities and participant's personal goals.       Expected Outcomes  Short Term: Attend rehab on a regular basis to increase amount of physical activity.;Long Term: Add in home exercise to make exercise part of routine and to increase amount of physical activity.;Long Term: Exercising regularly at least 3-5 days a week.       Increase Strength and Stamina  Yes       Intervention  Develop an individualized exercise prescription for aerobic and resistive training based on initial evaluation findings, risk stratification, comorbidities and participant's personal goals.;Provide advice, education, support and counseling about physical activity/exercise needs.       Expected Outcomes  Short Term: Increase workloads from initial exercise prescription for resistance, speed, and METs.;Short Term: Perform resistance training exercises routinely during rehab and add in resistance training at home;Long Term: Improve cardiorespiratory fitness, muscular endurance and strength as measured by increased METs and functional capacity (6MWT)       Able to understand and use rate of perceived exertion (RPE) scale  Yes       Intervention  Provide education and explanation on how to use RPE scale       Expected Outcomes  Short Term: Able to use RPE daily in rehab to express subjective intensity level;Long Term:  Able to use RPE to guide intensity level when exercising independently       Able to understand and use  Dyspnea scale  Yes       Intervention  Provide education and explanation on how to use Dyspnea scale       Expected Outcomes  Short Term: Able to use Dyspnea scale daily in rehab to express subjective sense of shortness of breath during exertion;Long Term: Able to use Dyspnea scale to guide intensity level when exercising independently       Knowledge and understanding of Target Heart Rate Range (THRR)  Yes       Intervention  Provide education and explanation of THRR including how the numbers were predicted and where they are located for reference       Expected Outcomes  Short Term: Able to state/look up THRR;Long Term: Able to use THRR to govern intensity when exercising independently;Short Term: Able to use daily as guideline for intensity in rehab       Understanding of Exercise Prescription  Yes       Intervention  Provide education, explanation, and written materials on patient's individual exercise prescription       Expected Outcomes  Short Term: Able to explain program exercise prescription;Long Term: Able to explain home exercise prescription to exercise independently          Exercise Goals Re-Evaluation :   Discharge Exercise Prescription (Final Exercise Prescription Changes):   Nutrition:  Target Goals: Understanding of nutrition guidelines, daily intake of sodium 1500mg , cholesterol 200mg , calories 30% from fat and 7% or less from saturated fats, daily to have 5 or more servings of fruits and vegetables.  Biometrics:    Nutrition Therapy Plan and Nutrition Goals: Nutrition Therapy & Goals - 10/01/17 1335      Nutrition Therapy   Diet  general, healthful  Personal Nutrition Goals   Nutrition Goal  pt will recognize symptoms that can interfere with adequate oral intake    Personal Goal #2  pt will consume high-energy, high-nutrient dense beverages when necessary to compensate for decreased oral intake of solid foods      Intervention Plan   Intervention   Prescribe, educate and counsel regarding individualized specific dietary modifications aiming towards targeted core components such as weight, hypertension, lipid management, diabetes, heart failure and other comorbidities.    Expected Outcomes  Short Term Goal: Understand basic principles of dietary content, such as calories, fat, sodium, cholesterol and nutrients.       Nutrition Assessments: Nutrition Assessments - 10/01/17 1326      Rate Your Plate Scores   Pre Score  41       Nutrition Goals Re-Evaluation: Nutrition Goals Re-Evaluation    [place] Name 10/01/17 1343             Goals   Nutrition Goal  pt will recognize symptions that can interfere with adequate oral intake          Nutrition Goals Discharge (Final Nutrition Goals Re-Evaluation): Nutrition Goals Re-Evaluation - 10/01/17 1343      Goals   Nutrition Goal  pt will recognize symptions that can interfere with adequate oral intake       Psychosocial: Target Goals: Acknowledge presence or absence of significant depression and/or stress, maximize coping skills, provide positive support system. Participant is able to verbalize types and ability to use techniques and skills needed for reducing stress and depression.  Initial Review & Psychosocial Screening: Initial Psych Review & Screening - 09/26/17 1438      Initial Review   Current issues with  None Identified      Family Dynamics   Good Support System?  Yes    Comments  faith, children, sisters      Barriers   Psychosocial barriers to participate in program  There are no identifiable barriers or psychosocial needs.      Screening Interventions   Interventions  Encouraged to exercise    Expected Outcomes  Short Term goal: Identification and review with participant of any Quality of Life or Depression concerns found by scoring the questionnaire.;Long Term goal: The participant improves quality of Life and PHQ9 Scores as seen by post scores and/or  verbalization of changes       Quality of Life Scores:  Scores of 19 and below usually indicate a poorer quality of life in these areas.  A difference of  2-3 points is a clinically meaningful difference.  A difference of 2-3 points in the total score of the Quality of Life Index has been associated with significant improvement in overall quality of life, self-image, physical symptoms, and general health in studies assessing change in quality of life.   PHQ-9: Recent Review Flowsheet Data    Depression screen [facility] 2/9 09/26/2017   Decreased Interest 0   Down, Depressed, Hopeless 0   PHQ - 2 Score 0   Altered sleeping 0   Tired, decreased energy 1   Change in appetite 3   Feeling bad or failure about yourself  0   Trouble concentrating 0   Moving slowly or fidgety/restless 0   Suicidal thoughts 0   PHQ-9 Score 4   Difficult doing work/chores Not difficult at all     Interpretation of Total Score  Total Score Depression Severity:  1-4 = Minimal depression, 5-9 = Mild depression, 10-14 = Moderate  depression, 15-19 = Moderately severe depression, 20-27 = Severe depression   Psychosocial Evaluation and Intervention: Psychosocial Evaluation - 09/26/17 1450      Psychosocial Evaluation & Interventions   Interventions  Encouraged to exercise with the program and follow exercise prescription    Expected Outcomes  Pt will continue to display positive outlook on life with healthy coping skills    Continue Psychosocial Services   Follow up required by staff       Psychosocial Re-Evaluation:   Psychosocial Discharge (Final Psychosocial Re-Evaluation):   Education: Education Goals: Education classes will be provided on a weekly basis, covering required topics. Participant will state understanding/return demonstration of topics presented.  Learning Barriers/Preferences: Learning Barriers/Preferences - 09/26/17 1451      Learning Barriers/Preferences   Learning Barriers  Sight     Learning Preferences  Computer/Internet;Group Instruction;Individual Instruction;Verbal Instruction;Video;Written Material       Education Topics: Risk Factor Reduction:  -Group instruction that is supported by a PowerPoint presentation. Instructor discusses the definition of a risk factor, different risk factors for pulmonary disease, and how the heart and lungs work together.     Nutrition for Pulmonary Patient:  -Group instruction provided by PowerPoint slides, verbal discussion, and written materials to support subject matter. The instructor gives an explanation and review of healthy diet recommendations, which includes a discussion on weight management, recommendations for fruit and vegetable consumption, as well as protein, fluid, caffeine, fiber, sodium, sugar, and alcohol. Tips for eating when patients are short of breath are discussed.   Pursed Lip Breathing:  -Group instruction that is supported by demonstration and informational handouts. Instructor discusses the benefits of pursed lip and diaphragmatic breathing and detailed demonstration on how to preform both.     Oxygen Safety:  -Group instruction provided by PowerPoint, verbal discussion, and written material to support subject matter. There is an overview of "What is Oxygen" and "Why do we need it".  Instructor also reviews how to create a safe environment for oxygen use, the importance of using oxygen as prescribed, and the risks of noncompliance. There is a brief discussion on traveling with oxygen and resources the patient may utilize.   Oxygen Equipment:  -Group instruction provided by [facility] Staff utilizing handouts, written materials, and equipment demonstrations.   Signs and Symptoms:  -Group instruction provided by written material and verbal discussion to support subject matter. Warning signs and symptoms of infection, stroke, and heart attack are reviewed and when to call the physician/911 reinforced. Tips  for preventing the spread of infection discussed.   Advanced Directives:  -Group instruction provided by verbal instruction and written material to support subject matter. Instructor reviews Advanced Directive laws and proper instruction for filling out document.   Pulmonary Video:  -Group video education that reviews the importance of medication and oxygen compliance, exercise, good nutrition, pulmonary hygiene, and pursed lip and diaphragmatic breathing for the pulmonary patient.   Exercise for the Pulmonary Patient:  -Group instruction that is supported by a PowerPoint presentation. Instructor discusses benefits of exercise, core components of exercise, frequency, duration, and intensity of an exercise routine, importance of utilizing pulse oximetry during exercise, safety while exercising, and options of places to exercise outside of rehab.     Pulmonary Medications:  -Verbally interactive group education provided by instructor with focus on inhaled medications and proper administration.   Anatomy and Physiology of the Respiratory System and Intimacy:  -Group instruction provided by PowerPoint, verbal discussion, and written material to support subject matter.  Instructor reviews respiratory cycle and anatomical components of the respiratory system and their functions. Instructor also reviews differences in obstructive and restrictive respiratory diseases with examples of each. Intimacy, Sex, and Sexuality differences are reviewed with a discussion on how relationships can change when diagnosed with pulmonary disease. Common sexual concerns are reviewed.   MD DAY -A group question and answer session with a medical doctor that allows participants to ask questions that relate to their pulmonary disease state.   OTHER EDUCATION -Group or individual verbal, written, or video instructions that support the educational goals of the pulmonary rehab program.   Holiday Eating Survival Tips:   -Group instruction provided by PowerPoint slides, verbal discussion, and written materials to support subject matter. The instructor gives patients tips, tricks, and techniques to help them not only survive but enjoy the holidays despite the onslaught of food that accompanies the holidays.   Knowledge Questionnaire Score: Knowledge Questionnaire Score - 09/27/17 1459      Knowledge Questionnaire Score   Pre Score  -- pre score       Core Components/Risk Factors/Patient Goals at Admission: Personal Goals and Risk Factors at Admission - 09/26/17 1436      Core Components/Risk Factors/Patient Goals on Admission    Weight Management  Weight Maintenance;Yes    Intervention  Weight Management: Provide education and appropriate resources to help participant work on and attain dietary goals.;Weight Management: Develop a combined nutrition and exercise program designed to reach desired caloric intake, while maintaining appropriate intake of nutrient and fiber, sodium and fats, and appropriate energy expenditure required for the weight goal.    Expected Outcomes  Short Term: Continue to assess and modify interventions until short term weight is achieved;Weight Maintenance: Understanding of the daily nutrition guidelines, which includes 25-35% calories from fat, 7% or less cal from saturated fats, less than 200mg  cholesterol, less than 1.5gm of sodium, & 5 or more servings of fruits and vegetables daily;Understanding recommendations for meals to include 15-35% energy as protein, 25-35% energy from fat, 35-60% energy from carbohydrates, less than 200mg  of dietary cholesterol, 20-35 gm of total fiber daily;Understanding of distribution of calorie intake throughout the day with the consumption of 4-5 meals/snacks    Improve shortness of breath with ADL's  Yes    Intervention  Provide education, individualized exercise plan and daily activity instruction to help decrease symptoms of SOB with activities of  daily living.    Expected Outcomes  Short Term: Improve cardiorespiratory fitness to achieve a reduction of symptoms when performing ADLs;Long Term: Be able to perform more ADLs without symptoms or delay the onset of symptoms    Diabetes  Yes    Intervention  Provide education about signs/symptoms and action to take for hypo/hyperglycemia.;Provide education about proper nutrition, including hydration, and aerobic/resistive exercise prescription along with prescribed medications to achieve blood glucose in normal ranges: Fasting glucose 65-99 mg/dL    Expected Outcomes  Short Term: Participant verbalizes understanding of the signs/symptoms and immediate care of hyper/hypoglycemia, proper foot care and importance of medication, aerobic/resistive exercise and nutrition plan for blood glucose control.;Long Term: Attainment of HbA1C < 7%.       Core Components/Risk Factors/Patient Goals Review:    Core Components/Risk Factors/Patient Goals at Discharge (Final Review):    ITP Comments: ITP Comments    Row Name 09/26/17 1423           ITP Comments  Dr. Jennet Maduro, Medical Director          Comments:

## 2017-10-10 ENCOUNTER — Encounter (HOSPITAL_COMMUNITY)
Admission: RE | Admit: 2017-10-10 | Discharge: 2017-10-10 | Disposition: A | Discharge status: Home / Self Care | Payer: Medicare Other | Source: Ambulatory Visit | Attending: Internal Medicine | Admitting: Internal Medicine

## 2017-10-10 DIAGNOSIS — I10 Essential (primary) hypertension: Secondary | ICD-10-CM | POA: Diagnosis not present

## 2017-10-10 DIAGNOSIS — E785 Hyperlipidemia, unspecified: Secondary | ICD-10-CM | POA: Diagnosis not present

## 2017-10-10 DIAGNOSIS — E0590 Thyrotoxicosis, unspecified without thyrotoxic crisis or storm: Secondary | ICD-10-CM | POA: Diagnosis not present

## 2017-10-10 DIAGNOSIS — E039 Hypothyroidism, unspecified: Secondary | ICD-10-CM | POA: Diagnosis not present

## 2017-10-10 DIAGNOSIS — E119 Type 2 diabetes mellitus without complications: Secondary | ICD-10-CM | POA: Diagnosis not present

## 2017-10-10 MED ORDER — TECHNETIUM TC 99M MEDRONATE IV KIT
21.3000 | PACK | Freq: Once | INTRAVENOUS | Status: AC | PRN
  Administered 2017-10-10: 21.3 via INTRAVENOUS

## 2017-10-11 ENCOUNTER — Encounter (HOSPITAL_COMMUNITY)
Admission: RE | Admit: 2017-10-11 | Discharge: 2017-10-11 | Disposition: A | Discharge status: Home / Self Care | Payer: Medicare Other | Source: Ambulatory Visit | Attending: Internal Medicine | Admitting: Internal Medicine

## 2017-10-11 DIAGNOSIS — J438 Other emphysema: Secondary | ICD-10-CM | POA: Diagnosis not present

## 2017-10-11 DIAGNOSIS — I10 Essential (primary) hypertension: Secondary | ICD-10-CM | POA: Diagnosis not present

## 2017-10-11 DIAGNOSIS — J439 Emphysema, unspecified: Secondary | ICD-10-CM

## 2017-10-11 DIAGNOSIS — E785 Hyperlipidemia, unspecified: Secondary | ICD-10-CM | POA: Diagnosis not present

## 2017-10-11 DIAGNOSIS — I700 Atherosclerosis of aorta: Secondary | ICD-10-CM | POA: Diagnosis not present

## 2017-10-11 DIAGNOSIS — I2510 Atherosclerotic heart disease of native coronary artery without angina pectoris: Secondary | ICD-10-CM | POA: Diagnosis not present

## 2017-10-11 DIAGNOSIS — K219 Gastro-esophageal reflux disease without esophagitis: Secondary | ICD-10-CM | POA: Diagnosis not present

## 2017-10-11 NOTE — Progress Notes (Signed)
Daily Session Note  Patient Details  Name: Karen West MRN: [id number] Date of Birth: 08/17/1940 Referring Provider:     Pulmonary Rehab Walk Test from 10/04/2017 in [place]  Referring Provider  Dr. Chase Caller      Encounter Date: 10/11/2017  Check In: Session Check In - 10/11/17 1109      Check-In   Supervising physician immediately available to respond to emergencies  [facility] immediately available    Physician(s)  Dr. Tawanna Solo    Location  [facility]    Staff Present  Su Hilt, MS, ACSM [name], Exercise Physiologist;Lisa [name], RN;Carlette Carlton, RN, BSN    Medication changes reported      No    Fall or balance concerns reported     No    Tobacco Cessation  No Change    Warm-up and Cool-down  Performed as group-led instruction    Resistance Training Performed  Yes    VAD Patient?  No    PAD/SET Patient?  No      Pain Assessment   Currently in Pain?  No/denies    Multiple Pain Sites  No       Capillary Blood Glucose: No results found for this or any previous visit (from the past 24 hour(s)).    Social History   Tobacco Use  Smoking Status Never Smoker  Smokeless Tobacco Never Used    Goals Met:  Exercise tolerated well No report of cardiac concerns or symptoms Strength training completed today  Goals Unmet:  Not Applicable  Comments: Service time is from 10:30a to 12:30p    Dr. Rush Farmer is Medical Director for Pulmonary Rehab at [facility].

## 2017-10-12 DIAGNOSIS — E1121 Type 2 diabetes mellitus with diabetic nephropathy: Secondary | ICD-10-CM | POA: Diagnosis not present

## 2017-10-12 DIAGNOSIS — E039 Hypothyroidism, unspecified: Secondary | ICD-10-CM | POA: Diagnosis not present

## 2017-10-12 DIAGNOSIS — I10 Essential (primary) hypertension: Secondary | ICD-10-CM | POA: Diagnosis not present

## 2017-10-12 DIAGNOSIS — E785 Hyperlipidemia, unspecified: Secondary | ICD-10-CM | POA: Diagnosis not present

## 2017-10-15 DIAGNOSIS — R937 Abnormal findings on diagnostic imaging of other parts of musculoskeletal system: Secondary | ICD-10-CM | POA: Diagnosis not present

## 2017-10-15 DIAGNOSIS — R5383 Other fatigue: Secondary | ICD-10-CM | POA: Diagnosis not present

## 2017-10-15 DIAGNOSIS — Z6827 Body mass index (BMI) 27.0-27.9, adult: Secondary | ICD-10-CM | POA: Diagnosis not present

## 2017-10-15 DIAGNOSIS — D649 Anemia, unspecified: Secondary | ICD-10-CM | POA: Diagnosis not present

## 2017-10-15 DIAGNOSIS — I10 Essential (primary) hypertension: Secondary | ICD-10-CM | POA: Diagnosis not present

## 2017-10-15 DIAGNOSIS — D519 Vitamin B12 deficiency anemia, unspecified: Secondary | ICD-10-CM | POA: Diagnosis not present

## 2017-10-15 DIAGNOSIS — E785 Hyperlipidemia, unspecified: Secondary | ICD-10-CM | POA: Diagnosis not present

## 2017-10-15 DIAGNOSIS — M1990 Unspecified osteoarthritis, unspecified site: Secondary | ICD-10-CM | POA: Diagnosis not present

## 2017-10-15 DIAGNOSIS — E1121 Type 2 diabetes mellitus with diabetic nephropathy: Secondary | ICD-10-CM | POA: Diagnosis not present

## 2017-10-15 DIAGNOSIS — E890 Postprocedural hypothyroidism: Secondary | ICD-10-CM | POA: Diagnosis not present

## 2017-10-15 DIAGNOSIS — K2270 Barrett's esophagus without dysplasia: Secondary | ICD-10-CM | POA: Diagnosis not present

## 2017-10-15 DIAGNOSIS — Z0000 Encounter for general adult medical examination without abnormal findings: Secondary | ICD-10-CM | POA: Diagnosis not present

## 2017-10-16 ENCOUNTER — Other Ambulatory Visit (HOSPITAL_COMMUNITY): Payer: Medicare Other

## 2017-10-16 ENCOUNTER — Encounter (HOSPITAL_COMMUNITY)
Admission: RE | Admit: 2017-10-16 | Discharge: 2017-10-16 | Disposition: A | Discharge status: Home / Self Care | Payer: Medicare Other | Source: Ambulatory Visit | Attending: Internal Medicine | Admitting: Internal Medicine

## 2017-10-16 ENCOUNTER — Encounter (HOSPITAL_COMMUNITY): Payer: Medicare Other

## 2017-10-17 DIAGNOSIS — C3490 Malignant neoplasm of unspecified part of unspecified bronchus or lung: Secondary | ICD-10-CM | POA: Diagnosis not present

## 2017-10-17 DIAGNOSIS — E1122 Type 2 diabetes mellitus with diabetic chronic kidney disease: Secondary | ICD-10-CM | POA: Diagnosis not present

## 2017-10-17 DIAGNOSIS — I129 Hypertensive chronic kidney disease with stage 1 through stage 4 chronic kidney disease, or unspecified chronic kidney disease: Secondary | ICD-10-CM | POA: Diagnosis not present

## 2017-10-17 DIAGNOSIS — C50919 Malignant neoplasm of unspecified site of unspecified female breast: Secondary | ICD-10-CM | POA: Diagnosis not present

## 2017-10-17 DIAGNOSIS — N183 Chronic kidney disease, stage 3 (moderate): Secondary | ICD-10-CM | POA: Diagnosis not present

## 2017-10-17 DIAGNOSIS — N179 Acute kidney failure, unspecified: Secondary | ICD-10-CM | POA: Diagnosis not present

## 2017-10-17 DIAGNOSIS — R809 Proteinuria, unspecified: Secondary | ICD-10-CM | POA: Diagnosis not present

## 2017-10-17 DIAGNOSIS — D631 Anemia in chronic kidney disease: Secondary | ICD-10-CM | POA: Diagnosis not present

## 2017-10-17 DIAGNOSIS — E039 Hypothyroidism, unspecified: Secondary | ICD-10-CM | POA: Diagnosis not present

## 2017-10-18 ENCOUNTER — Encounter (HOSPITAL_COMMUNITY)
Admission: RE | Admit: 2017-10-18 | Discharge: 2017-10-18 | Disposition: A | Discharge status: Home / Self Care | Payer: Medicare Other | Source: Ambulatory Visit | Attending: Internal Medicine | Admitting: Internal Medicine

## 2017-10-18 ENCOUNTER — Other Ambulatory Visit: Payer: Self-pay | Admitting: Internal Medicine

## 2017-10-18 ENCOUNTER — Ambulatory Visit: Payer: Medicare Other | Admitting: Rheumatology

## 2017-10-18 DIAGNOSIS — I2510 Atherosclerotic heart disease of native coronary artery without angina pectoris: Secondary | ICD-10-CM | POA: Diagnosis not present

## 2017-10-18 DIAGNOSIS — I10 Essential (primary) hypertension: Secondary | ICD-10-CM | POA: Diagnosis not present

## 2017-10-18 DIAGNOSIS — Z79899 Other long term (current) drug therapy: Secondary | ICD-10-CM | POA: Insufficient documentation

## 2017-10-18 DIAGNOSIS — Z7982 Long term (current) use of aspirin: Secondary | ICD-10-CM | POA: Insufficient documentation

## 2017-10-18 DIAGNOSIS — R937 Abnormal findings on diagnostic imaging of other parts of musculoskeletal system: Secondary | ICD-10-CM

## 2017-10-18 DIAGNOSIS — E0590 Thyrotoxicosis, unspecified without thyrotoxic crisis or storm: Secondary | ICD-10-CM | POA: Insufficient documentation

## 2017-10-18 DIAGNOSIS — J438 Other emphysema: Secondary | ICD-10-CM | POA: Insufficient documentation

## 2017-10-18 DIAGNOSIS — E785 Hyperlipidemia, unspecified: Secondary | ICD-10-CM | POA: Diagnosis not present

## 2017-10-18 DIAGNOSIS — I4510 Unspecified right bundle-branch block: Secondary | ICD-10-CM | POA: Insufficient documentation

## 2017-10-18 DIAGNOSIS — I700 Atherosclerosis of aorta: Secondary | ICD-10-CM | POA: Diagnosis not present

## 2017-10-18 DIAGNOSIS — J439 Emphysema, unspecified: Secondary | ICD-10-CM

## 2017-10-18 DIAGNOSIS — Z7984 Long term (current) use of oral hypoglycemic drugs: Secondary | ICD-10-CM | POA: Insufficient documentation

## 2017-10-18 DIAGNOSIS — E042 Nontoxic multinodular goiter: Secondary | ICD-10-CM | POA: Insufficient documentation

## 2017-10-18 DIAGNOSIS — G4733 Obstructive sleep apnea (adult) (pediatric): Secondary | ICD-10-CM | POA: Insufficient documentation

## 2017-10-18 DIAGNOSIS — Z79890 Hormone replacement therapy (postmenopausal): Secondary | ICD-10-CM | POA: Insufficient documentation

## 2017-10-18 DIAGNOSIS — K219 Gastro-esophageal reflux disease without esophagitis: Secondary | ICD-10-CM | POA: Diagnosis not present

## 2017-10-18 DIAGNOSIS — Z923 Personal history of irradiation: Secondary | ICD-10-CM | POA: Insufficient documentation

## 2017-10-18 NOTE — Progress Notes (Signed)
Daily Session Note  Patient Details  Name: ALEANE WESENBERG MRN: [id number] Date of Birth: 14-Dec-1940 Referring Provider:     Pulmonary Rehab Walk Test from 10/04/2017 in [place]  Referring Provider  Dr. Chase Caller      Encounter Date: 10/18/2017  Check In: Session Check In - 10/18/17 1125      Check-In   Supervising physician immediately available to respond to emergencies  [facility] immediately available    Physician(s)  Dr. Herbert Moors    Location  [facility]    Staff Present  Su Hilt, MS, ACSM [name], Exercise Physiologist;Lisa [name], RN;Carlette Carlton, RN, BSN    Medication changes reported      No    Fall or balance concerns reported     No    Tobacco Cessation  No Change    Warm-up and Cool-down  Performed as group-led instruction    Resistance Training Performed  Yes    VAD Patient?  No    PAD/SET Patient?  No      Pain Assessment   Currently in Pain?  No/denies    Multiple Pain Sites  No       Capillary Blood Glucose: No results found for this or any previous visit (from the past 24 hour(s)).    Social History   Tobacco Use  Smoking Status Never Smoker  Smokeless Tobacco Never Used    Goals Met:  Exercise tolerated well No report of cardiac concerns or symptoms Strength training completed today  Goals Unmet:  Not Applicable  Comments: Service time is from 10:30a to 12:30p    Dr. Rush Farmer is Medical Director for Pulmonary Rehab at [facility].

## 2017-10-19 DIAGNOSIS — H8113 Benign paroxysmal vertigo, bilateral: Secondary | ICD-10-CM | POA: Diagnosis not present

## 2017-10-21 ENCOUNTER — Other Ambulatory Visit: Payer: Self-pay | Admitting: Internal Medicine

## 2017-10-22 DIAGNOSIS — M19071 Primary osteoarthritis, right ankle and foot: Secondary | ICD-10-CM | POA: Diagnosis not present

## 2017-10-22 DIAGNOSIS — R768 Other specified abnormal immunological findings in serum: Secondary | ICD-10-CM | POA: Diagnosis not present

## 2017-10-22 DIAGNOSIS — M79672 Pain in left foot: Secondary | ICD-10-CM | POA: Diagnosis not present

## 2017-10-22 DIAGNOSIS — M064 Inflammatory polyarthropathy: Secondary | ICD-10-CM | POA: Diagnosis not present

## 2017-10-22 DIAGNOSIS — M7989 Other specified soft tissue disorders: Secondary | ICD-10-CM | POA: Diagnosis not present

## 2017-10-22 DIAGNOSIS — M19072 Primary osteoarthritis, left ankle and foot: Secondary | ICD-10-CM | POA: Diagnosis not present

## 2017-10-22 DIAGNOSIS — Z780 Asymptomatic menopausal state: Secondary | ICD-10-CM | POA: Diagnosis not present

## 2017-10-22 DIAGNOSIS — D8989 Other specified disorders involving the immune mechanism, not elsewhere classified: Secondary | ICD-10-CM | POA: Diagnosis not present

## 2017-10-22 DIAGNOSIS — M79673 Pain in unspecified foot: Secondary | ICD-10-CM | POA: Diagnosis not present

## 2017-10-22 DIAGNOSIS — M79671 Pain in right foot: Secondary | ICD-10-CM | POA: Diagnosis not present

## 2017-10-23 ENCOUNTER — Encounter (HOSPITAL_COMMUNITY)
Admission: RE | Admit: 2017-10-23 | Discharge: 2017-10-23 | Disposition: A | Discharge status: Home / Self Care | Payer: Medicare Other | Source: Ambulatory Visit | Attending: Internal Medicine | Admitting: Internal Medicine

## 2017-10-23 ENCOUNTER — Encounter: Payer: Self-pay | Admitting: Sports Medicine

## 2017-10-23 ENCOUNTER — Ambulatory Visit (INDEPENDENT_AMBULATORY_CARE_PROVIDER_SITE_OTHER): Payer: Medicare Other | Admitting: Sports Medicine

## 2017-10-23 VITALS — Wt 145.1 lb

## 2017-10-23 DIAGNOSIS — I700 Atherosclerosis of aorta: Secondary | ICD-10-CM | POA: Diagnosis not present

## 2017-10-23 DIAGNOSIS — K219 Gastro-esophageal reflux disease without esophagitis: Secondary | ICD-10-CM | POA: Diagnosis not present

## 2017-10-23 DIAGNOSIS — J439 Emphysema, unspecified: Secondary | ICD-10-CM

## 2017-10-23 DIAGNOSIS — J438 Other emphysema: Secondary | ICD-10-CM | POA: Diagnosis not present

## 2017-10-23 DIAGNOSIS — M79675 Pain in left toe(s): Secondary | ICD-10-CM

## 2017-10-23 DIAGNOSIS — I2510 Atherosclerotic heart disease of native coronary artery without angina pectoris: Secondary | ICD-10-CM | POA: Diagnosis not present

## 2017-10-23 DIAGNOSIS — I10 Essential (primary) hypertension: Secondary | ICD-10-CM | POA: Diagnosis not present

## 2017-10-23 DIAGNOSIS — B351 Tinea unguium: Secondary | ICD-10-CM | POA: Diagnosis not present

## 2017-10-23 DIAGNOSIS — M79674 Pain in right toe(s): Secondary | ICD-10-CM | POA: Diagnosis not present

## 2017-10-23 DIAGNOSIS — E119 Type 2 diabetes mellitus without complications: Secondary | ICD-10-CM

## 2017-10-23 DIAGNOSIS — E785 Hyperlipidemia, unspecified: Secondary | ICD-10-CM | POA: Diagnosis not present

## 2017-10-23 NOTE — Progress Notes (Signed)
Daily Session Note  Patient Details  Name: Karen West MRN: [id number] Date of Birth: Jan 14, 1941 Referring Provider:     Pulmonary Rehab Walk Test from 10/04/2017 in [place]  Referring Provider  Dr. Chase Caller      Encounter Date: 10/23/2017  Check In: Session Check In - 10/23/17 1041      Check-In   Supervising physician immediately available to respond to emergencies  [facility] immediately available    Physician(s)  Dr. Reesa Chew    Location  [facility]    Staff Present  Su Hilt, MS, ACSM [name], Exercise Physiologist;Lisa [name], RN;Carlette Carlton, RN, BSN    Medication changes reported      No    Fall or balance concerns reported     No    Tobacco Cessation  No Change    Warm-up and Cool-down  Performed as group-led instruction    Resistance Training Performed  Yes    VAD Patient?  No    PAD/SET Patient?  No      Pain Assessment   Currently in Pain?  No/denies    Multiple Pain Sites  No       Capillary Blood Glucose: No results found for this or any previous visit (from the past 24 hour(s)). POCT Glucose - 10/23/17 1242      POCT Blood Glucose   Pre-Exercise  119 mg/dL    Post-Exercise  211 mg/dL      Exercise Prescription Changes - 10/23/17 1200      Response to Exercise   Blood Pressure (Admit)  120/60    Blood Pressure (Exercise)  154/78    Blood Pressure (Exit)  122/62    Heart Rate (Admit)  78 bpm    Heart Rate (Exercise)  99 bpm    Heart Rate (Exit)  78 bpm    Oxygen Saturation (Admit)  98 %    Oxygen Saturation (Exercise)  96 %    Oxygen Saturation (Exit)  96 %    Rating of Perceived Exertion (Exercise)  13    Perceived Dyspnea (Exercise)  1    Duration  Progress to 45 minutes of aerobic exercise without signs/symptoms of physical distress    Intensity  THRR unchanged      Progression   Progression  Continue to progress workloads to maintain intensity without signs/symptoms of  physical distress.      Resistance Training   Training Prescription  Yes    Weight  orange bands    Reps  10-15      NuStep   Level  3    SPM  80    Minutes  17    METs  2.8      Arm Ergometer   Level  1    Watts  10    Minutes  17       Social History   Tobacco Use  Smoking Status Never Smoker  Smokeless Tobacco Never Used    Goals Met:  Exercise tolerated well No report of cardiac concerns or symptoms Strength training completed today  Goals Unmet:  Not Applicable  Comments: Service time is from 10:30a to 12:15p    Dr. Rush Farmer is Medical Director for Pulmonary Rehab at [facility].

## 2017-10-23 NOTE — Progress Notes (Signed)
Subjective: Karen West is a 77 y.o. female patient with history of diabetes who presents to office today complaining of long,mildly painful nails  while ambulating in shoes; unable to trim. Patient states that the glucose reading this morning was "good", 89.  Patient is seeing the rheumatologist and in Pulmonary rehab. No other issues noted.    Patient Active Problem List   Diagnosis Date Noted  . Pelvic floor relaxation 10/02/2017  . Blood in urine 10/02/2017  . Left lower quadrant pain 10/02/2017  . Nausea 10/02/2017  . Nocturia 10/02/2017  . Osteopenia 10/02/2017  . Pain in pelvis 10/02/2017  . Adenocarcinoma of left lung, stage 1 ([place]) 07/26/2017  . Iron deficiency anemia 07/26/2017  . S/P lobectomy of lung   . Long QT interval   . Sinus tachycardia   . Abnormal positron emission tomography (PET) scan 05/06/2017  . Pain of left shoulder joint on movement 04/09/2017  . Rib pain 04/09/2017  . BPPV (benign paroxysmal positional vertigo), left 03/27/2017  . Lipoma 04/06/2016  . Hypersomnia 01/18/2016  . Upper airway resistance syndrome 01/18/2016  . Hyperthyroidism 10/05/2011  . Chest pain at rest 10/04/2011  . Angina at rest [facility]) 10/04/2011  . Headache(784.0) 10/04/2011  . FH: MI (myocardial infarction) 10/04/2011  . Chest pressure 10/04/2011  . Hyperlipidemia   . Hypertension   . OSA on CPAP   . Breast cancer ([place])   . Chronic anemia   . GERD (gastroesophageal reflux disease)   . Obesity   . Osteoarthritis   . Right bundle branch block   . Atherosclerosis of aorta ([place])   . Multiple thyroid nodules   . Type II or unspecified type diabetes mellitus without mention of complication, uncontrolled   . Chronic headaches   . OBESITY 11/17/2009  . OBSTRUCTIVE SLEEP APNEA 01/05/2009  . DIABETES, TYPE 2 11/12/2008  . HYPERLIPIDEMIA 11/12/2008  . HYPERTENSION 11/12/2008  . Lung nodule 11/12/2008  . G E R D 11/12/2008  . UNSPECIFIED DISORDER OF KIDNEY AND URETER  11/12/2008  . OSTEOARTHRITIS 11/12/2008   Current Outpatient Medications on File Prior to Visit  Medication Sig Dispense Refill  . acetaminophen (TYLENOL) 500 MG tablet Take 1,000 mg by mouth every 6 (six) hours as needed for moderate pain or headache.    Marland Kitchen amLODipine (NORVASC) 10 MG tablet Take 10 mg by mouth daily.    Marland Kitchen aspirin 81 MG tablet Take 81 mg by mouth daily.      Marland Kitchen ezetimibe (ZETIA) 10 MG tablet Take 5 mg by mouth daily.     . ferrous sulfate 325 (65 FE) MG tablet TAKE 1 TABLET (325 MG TOTAL) BY MOUTH 3 (THREE) TIMES DAILY WITH MEALS. 270 tablet 0  . glipiZIDE (GLUCOTROL) 10 MG 24 hr tablet Take 10 mg by mouth 2 (two) times daily.      . hydrocortisone cream 1 % Apply 1 application topically 2 (two) times daily.    Marland Kitchen levothyroxine (SYNTHROID, LEVOTHROID) 88 MCG tablet Take 88 mcg by mouth daily.  5  . magnesium oxide (MAG-OX) 400 (241.3 Mg) MG tablet Take 2 tablets (800 mg total) by mouth 2 (two) times daily. 60 tablet 1  . metFORMIN (GLUCOPHAGE) 1000 MG tablet Take 1,000 mg by mouth 2 (two) times daily.     . Omega-3 Fatty Acids (FISH OIL) 1200 MG CAPS Take 1,200 mg by mouth 2 (two) times daily.     Marland Kitchen omeprazole (PRILOSEC) 20 MG capsule Take 20 mg by mouth 2 (two) times  daily. Takes prn    . rosuvastatin (CRESTOR) 20 MG tablet Take 20 mg by mouth daily.     No current facility-administered medications on file prior to visit.    Allergies  Allergen Reactions  . Tramadol Nausea And Vomiting and Other (See Comments)    "got sick to my stomach and was throwing up blood; it put me in the hospital")  . Lisinopril Rash and Other (See Comments)    Renal failure   . Tapazole [Methimazole] Itching and Rash    Recent Results (from the past 2160 hour(s))  CBC with Differential (Cancer Center Only)     Status: Abnormal   Collection Time: 07/26/17  3:11 PM  Result Value Ref Range   WBC Count 5.8 3.9 - 10.3 K/uL   RBC 3.38 (L) 3.70 - 5.45 MIL/uL   Hemoglobin 8.4 (L) 11.6 - 15.9 g/dL    HCT 26.3 (L) 34.8 - 46.6 %   MCV 77.7 (L) 79.5 - 101.0 fL   MCH 24.8 (L) 25.1 - 34.0 pg   MCHC 32.0 31.5 - 36.0 g/dL   RDW 17.3 (H) 11.2 - 14.5 %   Platelet Count 439 (H) 145 - 400 K/uL   Neutrophils Relative % 67 %   Neutro Abs 3.9 1.5 - 6.5 K/uL   Lymphocytes Relative 22 %   Lymphs Abs 1.3 0.9 - 3.3 K/uL   Monocytes Relative 8 %   Monocytes Absolute 0.4 0.1 - 0.9 K/uL   Eosinophils Relative 2 %   Eosinophils Absolute 0.1 0.0 - 0.5 K/uL   Basophils Relative 1 %   Basophils Absolute 0.0 0.0 - 0.1 K/uL    Comment: Performed at [facility], [address] [place], [place] [address]  CMP ([place] only)     Status: Abnormal   Collection Time: 07/26/17  3:11 PM  Result Value Ref Range   Sodium 137 136 - 145 mmol/L   Potassium 3.8 3.5 - 5.1 mmol/L   Chloride 102 98 - 109 mmol/L   CO2 28 22 - 29 mmol/L   Glucose, Bld 213 (H) 70 - 140 mg/dL   BUN 30 (H) 7 - 26 mg/dL   Creatinine 1.74 (H) 0.60 - 1.10 mg/dL   Calcium 12.0 (H) 8.4 - 10.4 mg/dL   Total Protein 7.9 6.4 - 8.3 g/dL   Albumin 3.9 3.5 - 5.0 g/dL   AST 15 5 - 34 U/L   ALT 12 0 - 55 U/L   Alkaline Phosphatase 78 40 - 150 U/L   Total Bilirubin 0.3 0.2 - 1.2 mg/dL   GFR, Est Non Af Am 28 (L) >60 mL/min   GFR, Est AFR Am 32 (L) >60 mL/min    Comment: (NOTE) The eGFR has been calculated using the CKD EPI equation. This calculation has not been validated in all clinical situations. eGFR's persistently <60 mL/min signify possible Chronic Kidney Disease.    Anion gap 7 3 - 11    Comment: Performed at [facility], [address], [place], [place] [address]  ANA, IFA Comprehensive Panel     Status: Abnormal   Collection Time: 09/11/17 12:41 PM  Result Value Ref Range   Anti Nuclear Antibody(ANA) POSITIVE (A) NEGATIVE    Comment: ANA IFA is a first line screen for detecting the presence of up to approximately 150 autoantibodies in various autoimmune diseases. A  positive ANA IFA result is suggestive of autoimmune disease and reflexes to titer and pattern. Further  laboratory testing may be considered if clinically indicated. . Visit Physician FAQs for interpretation of all antibodies in the [place], prevalence, and association with diseases at http://education.[facility].com/ [email or web address] .    ds DNA Ab <1 IU/mL    Comment:                            IU/mL       Interpretation                            < or = 4    Negative                            5-9         Indeterminate                            > or = 10   Positive .    Scleroderma (Scl-70) (ENA) Antibody, IgG <1.0 NEG <1.0 NEG AI   ENA SM Ab Ser-aCnc <1.0 NEG <1.0 NEG AI   SM/RNP <1.0 NEG <1.0 NEG AI   SSA (Ro) (ENA) Antibody, IgG <1.0 NEG <1.0 NEG AI   SSB (La) (ENA) Antibody, IgG <1.0 NEG <1.0 NEG AI  C-reactive protein     Status: Abnormal   Collection Time: 09/11/17 12:41 PM  Result Value Ref Range   CRP 17.1 (H) <8.0 mg/L  Rheumatoid factor     Status: None   Collection Time: 09/11/17 12:41 PM  Result Value Ref Range   Rhuematoid fact SerPl-aCnc <14 <14 IU/mL  Sedimentation rate     Status: Abnormal   Collection Time: 09/11/17 12:41 PM  Result Value Ref Range   Sed Rate 75 (H) 0 - 30 mm/h  Uric acid     Status: None   Collection Time: 09/11/17 12:41 PM  Result Value Ref Range   Uric Acid, Serum 5.8 2.5 - 7.0 mg/dL    Comment: Therapeutic target for gout patients: <6.0 mg/dL .   CBC with Differential     Status: Abnormal   Collection Time: 09/11/17 12:41 PM  Result Value Ref Range   WBC 6.7 3.8 - 10.8 Thousand/uL   RBC 3.77 (L) 3.80 - 5.10 Million/uL   Hemoglobin 9.3 (L) 11.7 - 15.5 g/dL   HCT 30.4 (L) 35.0 - 45.0 %   MCV 80.6 80.0 - 100.0 fL   MCH 24.7 (L) 27.0 - 33.0 pg   MCHC 30.6 (L) 32.0 - 36.0 g/dL   RDW 18.4 (H) 11.0 - 15.0 %   Platelets 380 140 - 400 Thousand/uL   MPV 10.0 7.5 - 12.5 fL   Neutro Abs 4,429 1,500 - 7,800 cells/uL   Lymphs Abs  1,494 850 - 3,900 cells/uL   WBC mixed population 630 200 - 950 cells/uL   Eosinophils Absolute 127 15 - 500 cells/uL   Basophils Absolute 20 0 - 200 cells/uL   Neutrophils Relative % 66.1 %   Total Lymphocyte 22.3 %   Monocytes Relative 9.4 %   Eosinophils Relative 1.9 %   Basophils Relative 0.3 %  Basic Metabolic Panel     Status: Abnormal   Collection Time: 09/11/17 12:41 PM  Result Value Ref Range   Glucose, Bld 131 65 - 139 mg/dL    Comment: .  Non-fasting reference interval .    BUN 14 7 - 25 mg/dL   Creat 1.16 (H) 0.60 - 0.93 mg/dL    Comment: For patients >52 years of age, the reference limit for Creatinine is approximately 13% higher for people identified as African-American. .    BUN/Creatinine Ratio 12 6 - 22 (calc)   Sodium 137 135 - 146 mmol/L   Potassium 3.8 3.5 - 5.3 mmol/L   Chloride 100 98 - 110 mmol/L   CO2 23 20 - 32 mmol/L   Calcium 8.5 (L) 8.6 - 10.4 mg/dL  Anti-nuclear ab-titer (ANA titer)     Status: Abnormal   Collection Time: 09/11/17 12:41 PM  Result Value Ref Range   ANA Pattern 1 HOMOGENEOUS (A)     Comment: Homogeneous pattern is associated with systemic lupus erythematosus (SLE), drug induced lupus and juvenile idiopathic arthritis.    ANA Titer 1 1:80 (H) titer    Comment: A low level ANA titer may be present in pre-clinical autoimmune diseases and normal individuals.                 Reference Range                 <1:40        Negative                 1:40-1:80    Low Antibody Level                 >1:80        Elevated Antibody Level .     Objective: General: Patient is awake, alert, and oriented x 3 and in no acute distress.  Integument: Skin is warm, dry and supple bilateral. Nails are tender, long, thickened and dystrophic with subungual debris, consistent with onychomycosis, 1-5 bilateral. No signs of infection. No open lesions or preulcerative lesions present bilateral. Remaining integument unremarkable.  Vasculature:   Dorsalis Pedis pulse 1/4 bilateral. Posterior Tibial pulse  1/4 bilateral. Capillary fill time <3 sec 1-5 bilateral. Positive hair growth to the level of the digits.Temperature gradient within normal limits. No varicosities present bilateral. No edema present bilateral.   Neurology: The patient has intact sensation measured with a 5.07/10g Semmes Weinstein Monofilament at all pedal sites bilateral . Vibratory sensation diminished bilateral with tuning fork. No Babinski sign present bilateral.   Musculoskeletal: Asymptomatic pes planus pedal deformities noted bilateral. Muscular strength 5/5 in all lower extremity muscular groups bilateral without pain on range of motion. No tenderness with calf compression bilateral.  Assessment and Plan: Problem List Items Addressed This Visit    None    Visit Diagnoses    Pain due to onychomycosis of toenails of both feet    -  Primary   Diabetes mellitus without complication ([place])         -Examined patient. -Discussed and educated patient on diabetic foot care, especially with regards to the vascular, neurological and musculoskeletal systems.  -Stressed the importance of good glycemic control and the detriment of not controlling glucose levels in relation to the foot. -Mechanically debrided all nails 1-5 bilateral using sterile nail nipper and filed with dremel without incident  -Continue with Rheumatology follow up  -Patient to return  in 3 months for at risk foot care -Patient advised to call the office if any problems or questions arise in the meantime.  Landis Martins, DPM

## 2017-10-25 ENCOUNTER — Encounter (HOSPITAL_COMMUNITY)
Admission: RE | Admit: 2017-10-25 | Discharge: 2017-10-25 | Disposition: A | Discharge status: Home / Self Care | Payer: Medicare Other | Source: Ambulatory Visit | Attending: Internal Medicine | Admitting: Internal Medicine

## 2017-10-25 ENCOUNTER — Other Ambulatory Visit: Payer: Self-pay | Admitting: Thoracic Surgery (Cardiothoracic Vascular Surgery)

## 2017-10-25 DIAGNOSIS — J439 Emphysema, unspecified: Secondary | ICD-10-CM

## 2017-10-25 DIAGNOSIS — C3492 Malignant neoplasm of unspecified part of left bronchus or lung: Secondary | ICD-10-CM

## 2017-10-30 ENCOUNTER — Other Ambulatory Visit: Payer: Self-pay

## 2017-10-30 ENCOUNTER — Ambulatory Visit
Admission: RE | Admit: 2017-10-30 | Discharge: 2017-10-30 | Disposition: A | Discharge status: Home / Self Care | Payer: Medicare Other | Source: Ambulatory Visit | Attending: Thoracic Surgery (Cardiothoracic Vascular Surgery) | Admitting: Thoracic Surgery (Cardiothoracic Vascular Surgery)

## 2017-10-30 ENCOUNTER — Ambulatory Visit
Admission: RE | Admit: 2017-10-30 | Discharge: 2017-10-30 | Disposition: A | Discharge status: Home / Self Care | Payer: Medicare Other | Source: Ambulatory Visit | Attending: Internal Medicine | Admitting: Internal Medicine

## 2017-10-30 ENCOUNTER — Encounter (HOSPITAL_COMMUNITY): Payer: Medicare Other

## 2017-10-30 ENCOUNTER — Other Ambulatory Visit: Payer: Medicare Other

## 2017-10-30 ENCOUNTER — Encounter: Payer: Self-pay | Admitting: Thoracic Surgery (Cardiothoracic Vascular Surgery)

## 2017-10-30 ENCOUNTER — Ambulatory Visit (INDEPENDENT_AMBULATORY_CARE_PROVIDER_SITE_OTHER): Payer: Medicare Other | Admitting: Thoracic Surgery (Cardiothoracic Vascular Surgery)

## 2017-10-30 VITALS — BP 140/70 | HR 74 | Ht 61.0 in | Wt 145.0 lb

## 2017-10-30 DIAGNOSIS — Z902 Acquired absence of lung [part of]: Secondary | ICD-10-CM | POA: Diagnosis not present

## 2017-10-30 DIAGNOSIS — C3492 Malignant neoplasm of unspecified part of left bronchus or lung: Secondary | ICD-10-CM | POA: Diagnosis not present

## 2017-10-30 DIAGNOSIS — R937 Abnormal findings on diagnostic imaging of other parts of musculoskeletal system: Secondary | ICD-10-CM

## 2017-10-30 DIAGNOSIS — J439 Emphysema, unspecified: Secondary | ICD-10-CM | POA: Diagnosis not present

## 2017-10-30 NOTE — Progress Notes (Signed)
[address]       [place] [address]             [phone]    HPI: Karen West returns for scheduled follow-up visit  Karen West is a 77 year old woman who is a lifelong non-smoker.  She was found to have a lung nodule back in 2010.  The lung nodule was stable initially and she was lost to follow-up.  In November 2018 she was having some shortness of breath.  A CT showed the nodule was still present and had increased in size.  It was hypermetabolic on PET/CT.  She also had a hypermetabolic thyroid.  In February 2019 she had a thyroidectomy.  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  Endobronchial ultrasound was negative for involvement of her mediastinal lymph nodes.  She underwent left upper lobectomy and node dissection on 06/04/2017.  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  I last saw her in the office on 07/24/2017.  She was still having some incisional pain but was not taking any narcotics.  She was using Tylenol for that.  She saw Dr. Julien Nordmann in consultation.  No adjuvant chemotherapy was necessary.  In the interim since her last visit she has been feeling well.  She still has soreness in the area of her incision.  She continues to take Tylenol for that.  She has not had any respiratory issues.  She has started pulmonary rehab.  Past Medical History:  Diagnosis Date  . Atherosclerosis of aorta ([place])    a. 01/2017/03/2017 - noted on high res chest CTs.  . Breast cancer ([place])    a. Bilateral --> s/p left mastectomy  . Carotid artery disease ([place])    a. 07/6312 w/ 97-02% LICA stenosis and <63% RICA stenosis; b. 10/2015 Carotid U/S: < 50% BICA stenosis  . Chest pain    a. 09/2011 MV: EF 68%, no ischemia/infarct.  . Chronic anemia   . Chronic headaches    denies  . Coronary artery calcification seen on CT scan    a. 01/2017 High res CT: atherosclerotic calcification of the arterial vascularture,  including severe involvement of the coronary arteries; b. 03/2017 CT Chest: coronary and Ao atheroscelrosis.  Marland Kitchen GERD (gastroesophageal reflux disease)   . History of echocardiogram    a. 09/2011 Echo: EF 55-60%, no rwma, triv AI, PASP 12mmHg.  Marland Kitchen Hyperlipidemia   . Hypertension   . Hyperthyroidism   . Left upper lobe pulmonary nodule    a. 02/2017 PET: slowing enlarging 1.7cm LUL nodule w/ low-grade metabolic activity; b. 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 05/2017 s/p VATS.  . Multinodular goiter    a. 02/2017 PET scan- Hypermetabolic nodule;  b. 07/275 s/p thyroidectomy  . Obesity   . OSA on CPAP    cpap  . Osteoarthritis   . Personal history of radiation therapy 1999  . Right bundle branch block   . Type II or unspecified type diabetes mellitus without mention of complication, uncontrolled     Current Outpatient Medications  Medication Sig Dispense Refill  . acetaminophen (TYLENOL) 500 MG tablet Take 1,000 mg by mouth every 6 (six) hours as needed for moderate pain or headache.    Marland Kitchen amLODipine (NORVASC) 10 MG tablet Take 10 mg by mouth daily.    Marland Kitchen aspirin 81 MG tablet Take 81 mg by mouth daily.      Marland Kitchen ezetimibe (ZETIA) 10  MG tablet Take 5 mg by mouth daily.     . ferrous sulfate 325 (65 FE) MG tablet TAKE 1 TABLET (325 MG TOTAL) BY MOUTH 3 (THREE) TIMES DAILY WITH MEALS. 270 tablet 0  . glipiZIDE (GLUCOTROL) 10 MG 24 hr tablet Take 10 mg by mouth 2 (two) times daily.      . hydrocortisone cream 1 % Apply 1 application topically 2 (two) times daily.    Marland Kitchen levothyroxine (SYNTHROID, LEVOTHROID) 88 MCG tablet Take 88 mcg by mouth daily.  5  . Magnesium Oxide 200 MG TABS Take 1 tablet by mouth daily.    . metFORMIN (GLUCOPHAGE) 1000 MG tablet Take 1,000 mg by mouth 2 (two) times daily.     . Omega-3 Fatty Acids (FISH OIL) 1200 MG CAPS Take 1,200 mg by mouth 2 (two) times daily.     Marland Kitchen omeprazole (PRILOSEC) 20 MG capsule Take 20 mg by mouth 2 (two) times daily. Takes prn    .  rosuvastatin (CRESTOR) 20 MG tablet Take 20 mg by mouth daily.     No current facility-administered medications for this visit.     Physical Exam BP 140/70 (BP Location: Right Arm, Patient Position: Sitting, Cuff Size: Large)   Pulse 74   Ht 5\' 1"  (1.549 m)   Wt 145 lb (65.8 kg)   SpO2 97% Comment: RA  BMI 27.38 kg/m  77 year old woman in no acute distress Alert and oriented x3 with no focal deficits Lungs slightly diminished at left base, otherwise clear Cardiac regular rate and rhythm with a 2/6 systolic murmur Incisions well-healed  Diagnostic Tests: CHEST - 2 VIEW  COMPARISON:  07/24/2017.  FINDINGS: Mediastinum normal. Postsurgical changes left hilum. Heart size stable. Mitral annular calcification. Prior left mastectomy. Surgical clips both axillas.  IMPRESSION: 1.  Postsurgical changes left lung.  2.  Prior left mastectomy.   Electronically Signed   By: Marcello Moores  Register   On: 10/30/2017 09:05 I personally reviewed the chest x-ray images and concur with the findings noted above  Impression: Karen West is a 77 year old woman who underwent a left upper lobectomy for a stage IA adenocarcinoma 5 months ago.  Overall she is doing extremely well.  She does still have some incisional soreness.  That may continue to improve.  She has been taking Tylenol for that.  I discussed the possibility of trying gabapentin or pregabalin with her, but she does not feel she needs that at this time.  She will continue to be followed by Dr. Earlie Server per guidelines.  Plan: I will be happy to see Karen West back at anytime in the future if I can be of any further assistance with her care  [name], MD [facility] [phone]

## 2017-10-31 DIAGNOSIS — H8113 Benign paroxysmal vertigo, bilateral: Secondary | ICD-10-CM | POA: Diagnosis not present

## 2017-11-01 ENCOUNTER — Encounter (HOSPITAL_COMMUNITY)
Admission: RE | Admit: 2017-11-01 | Discharge: 2017-11-01 | Disposition: A | Discharge status: Home / Self Care | Payer: Medicare Other | Source: Ambulatory Visit | Attending: Internal Medicine | Admitting: Internal Medicine

## 2017-11-01 DIAGNOSIS — J439 Emphysema, unspecified: Secondary | ICD-10-CM

## 2017-11-01 DIAGNOSIS — I10 Essential (primary) hypertension: Secondary | ICD-10-CM | POA: Diagnosis not present

## 2017-11-01 DIAGNOSIS — K219 Gastro-esophageal reflux disease without esophagitis: Secondary | ICD-10-CM | POA: Diagnosis not present

## 2017-11-01 DIAGNOSIS — I2510 Atherosclerotic heart disease of native coronary artery without angina pectoris: Secondary | ICD-10-CM | POA: Diagnosis not present

## 2017-11-01 DIAGNOSIS — I700 Atherosclerosis of aorta: Secondary | ICD-10-CM | POA: Diagnosis not present

## 2017-11-01 DIAGNOSIS — J438 Other emphysema: Secondary | ICD-10-CM | POA: Diagnosis not present

## 2017-11-01 DIAGNOSIS — E785 Hyperlipidemia, unspecified: Secondary | ICD-10-CM | POA: Diagnosis not present

## 2017-11-01 NOTE — Progress Notes (Signed)
  Daily Session Note  Patient Details  Name: Karen West MRN: [id number] Date of Birth: 06-24-1940 Referring Provider:     Pulmonary Rehab Walk Test from 10/04/2017 in [place]  Referring Provider  Dr. Chase Caller      Encounter Date: 11/01/2017  Check In: Session Check In - 11/01/17 1023      Check-In   Supervising physician immediately available to respond to emergencies  [facility] immediately available    Physician(s)  Dr. Florene Glen    Location  [facility]    Staff Present  Su Hilt, MS, ACSM [name], Exercise Physiologist;Annedrea Rosezella [place], RN, MHA;Carlette Wilber Oliphant, RN, BSN    Medication changes reported      No    Fall or balance concerns reported     No    Tobacco Cessation  No Change    Warm-up and Cool-down  Performed as group-led Higher education careers adviser Performed  Yes    VAD Patient?  No    PAD/SET Patient?  No      Pain Assessment   Currently in Pain?  No/denies    Multiple Pain Sites  No       Capillary Blood Glucose: No results found for this or any previous visit (from the past 24 hour(s)).    Social History   Tobacco Use  Smoking Status Never Smoker  Smokeless Tobacco Never Used    Goals Met:  Achieving weight loss Exercise tolerated well Personal goals reviewed  Goals Unmet:  Not Applicable  Comments: Service time is from 10:30a to 12:30p    Dr. Rush Farmer is Medical Director for Pulmonary Rehab at [facility].

## 2017-11-06 ENCOUNTER — Encounter (HOSPITAL_COMMUNITY)
Admission: RE | Admit: 2017-11-06 | Discharge: 2017-11-06 | Disposition: A | Discharge status: Home / Self Care | Payer: Medicare Other | Source: Ambulatory Visit | Attending: Internal Medicine | Admitting: Internal Medicine

## 2017-11-06 DIAGNOSIS — J438 Other emphysema: Secondary | ICD-10-CM | POA: Diagnosis not present

## 2017-11-06 DIAGNOSIS — I2510 Atherosclerotic heart disease of native coronary artery without angina pectoris: Secondary | ICD-10-CM | POA: Diagnosis not present

## 2017-11-06 DIAGNOSIS — K219 Gastro-esophageal reflux disease without esophagitis: Secondary | ICD-10-CM | POA: Diagnosis not present

## 2017-11-06 DIAGNOSIS — I10 Essential (primary) hypertension: Secondary | ICD-10-CM | POA: Diagnosis not present

## 2017-11-06 DIAGNOSIS — J439 Emphysema, unspecified: Secondary | ICD-10-CM

## 2017-11-06 DIAGNOSIS — I700 Atherosclerosis of aorta: Secondary | ICD-10-CM | POA: Diagnosis not present

## 2017-11-06 DIAGNOSIS — E785 Hyperlipidemia, unspecified: Secondary | ICD-10-CM | POA: Diagnosis not present

## 2017-11-06 NOTE — Progress Notes (Signed)
Daily Session Note  Patient Details  Name: Karen West MRN: [id number] Date of Birth: 10/15/1940 Referring Provider:     Pulmonary Rehab Walk Test from 10/04/2017 in [facility]  Referring Provider  Dr. Ramaswamy      Encounter Date: 11/06/2017  Check In: Session Check In - 11/06/17 1232      Check-In   Supervising physician immediately available to respond to emergencies  [facility] immediately available    Physician(s)  Dr. Powell    Location  [facility]    Staff Present   , MS, ACSM [name], Exercise Physiologist;Annedrea Stackhouse, RN, MHA;Carlette Carlton, RN, BSN;Lisa Hughes, RN    Medication changes reported      No    Fall or balance concerns reported     No    Tobacco Cessation  No Change    Warm-up and Cool-down  Performed as group-led instruction    Resistance Training Performed  Yes    VAD Patient?  No    PAD/SET Patient?  No      Pain Assessment   Currently in Pain?  No/denies       Capillary Blood Glucose: No results found for this or any previous visit (from the past 24 hour(s)).  Exercise Prescription Changes - 11/06/17 1200      Response to Exercise   Blood Pressure (Admit)  132/60    Blood Pressure (Exercise)  140/64    Blood Pressure (Exit)  122/58    Heart Rate (Admit)  82 bpm    Heart Rate (Exercise)  103 bpm    Heart Rate (Exit)  75 bpm    Oxygen Saturation (Admit)  99 %    Oxygen Saturation (Exercise)  99 %    Oxygen Saturation (Exit)  98 %    Rating of Perceived Exertion (Exercise)  13    Perceived Dyspnea (Exercise)  2    Duration  Progress to 45 minutes of aerobic exercise without signs/symptoms of physical distress    Intensity  THRR unchanged      Progression   Progression  Continue to progress workloads to maintain intensity without signs/symptoms of physical distress.      Resistance Training   Training Prescription  Yes    Weight  orange bands    Reps  10-15       NuStep   Level  3    SPM  80    Minutes  17    METs  2.7      Arm Ergometer   Level  3    Watts  10    Minutes  17      Track   Laps  8    Minutes  17       Social History   Tobacco Use  Smoking Status Never Smoker  Smokeless Tobacco Never Used    Goals Met:  Exercise tolerated well Personal goals reviewed  Goals Unmet:  Not Applicable  Comments: Service time is from 10:30a to 12:30p    Dr. Wesam G. Yacoub is Medical Director for Pulmonary Rehab at [facility]. 

## 2017-11-06 NOTE — Progress Notes (Signed)
Daily Session Note  Patient Details  Name: Karen West MRN: [id number] Date of Birth: 08/20/1940 Referring Provider:     Pulmonary Rehab Walk Test from 10/04/2017 in [facility]  Referring Provider  Dr. Ramaswamy      Encounter Date: 11/06/2017  Check In: Session Check In - 11/06/17 1232      Check-In   Supervising physician immediately available to respond to emergencies  [facility] immediately available    Physician(s)  Dr. Powell    Location  [facility]    Staff Present  Eliot Popper, MS, ACSM [name], Exercise Physiologist;Annedrea Stackhouse, RN, MHA;Carlette Carlton, RN, BSN;Lisa Hughes, RN    Medication changes reported      No    Fall or balance concerns reported     No    Tobacco Cessation  No Change    Warm-up and Cool-down  Performed as group-led instruction    Resistance Training Performed  Yes    VAD Patient?  No    PAD/SET Patient?  No      Pain Assessment   Currently in Pain?  No/denies       Capillary Blood Glucose: No results found for this or any previous visit (from the past 24 hour(s)).  Exercise Prescription Changes - 11/06/17 1200      Response to Exercise   Blood Pressure (Admit)  132/60    Blood Pressure (Exercise)  140/64    Blood Pressure (Exit)  122/58    Heart Rate (Admit)  82 bpm    Heart Rate (Exercise)  103 bpm    Heart Rate (Exit)  75 bpm    Oxygen Saturation (Admit)  99 %    Oxygen Saturation (Exercise)  99 %    Oxygen Saturation (Exit)  98 %    Rating of Perceived Exertion (Exercise)  13    Perceived Dyspnea (Exercise)  2    Duration  Progress to 45 minutes of aerobic exercise without signs/symptoms of physical distress    Intensity  THRR unchanged      Progression   Progression  Continue to progress workloads to maintain intensity without signs/symptoms of physical distress.      Resistance Training   Training Prescription  Yes    Weight  orange bands    Reps  10-15       NuStep   Level  3    SPM  80    Minutes  17    METs  2.7      Arm Ergometer   Level  3    Watts  10    Minutes  17      Track   Laps  8    Minutes  17       Social History   Tobacco Use  Smoking Status Never Smoker  Smokeless Tobacco Never Used    Goals Met:  Exercise tolerated well Personal goals reviewed  Goals Unmet:  Not Applicable  Comments: Service time is from 10:30a to 12:30p    Dr. Wesam G. Yacoub is Medical Director for Pulmonary Rehab at  Hospital. 

## 2017-11-06 NOTE — Progress Notes (Signed)
Pulmonary Individual Treatment Plan  Patient Details  Name: Karen West MRN: [id number] Date of Birth: 18-Sep-1940 Referring Provider:     Pulmonary Rehab Walk Test from 10/04/2017 in [place]  Referring Provider  Dr. Chase Caller      Initial Encounter Date:    Pulmonary Rehab Walk Test from 10/04/2017 in [place]  Date  10/04/17      Visit Diagnosis: Pulmonary emphysema, unspecified emphysema type ([place])  Patient's Home Medications on Admission:   Current Outpatient Medications:  .  acetaminophen (TYLENOL) 500 MG tablet, Take 1,000 mg by mouth every 6 (six) hours as needed for moderate pain or headache., Disp: , Rfl:  .  amLODipine (NORVASC) 10 MG tablet, Take 10 mg by mouth daily., Disp: , Rfl:  .  aspirin 81 MG tablet, Take 81 mg by mouth daily.  , Disp: , Rfl:  .  ezetimibe (ZETIA) 10 MG tablet, Take 5 mg by mouth daily. , Disp: , Rfl:  .  ferrous sulfate 325 (65 FE) MG tablet, TAKE 1 TABLET (325 MG TOTAL) BY MOUTH 3 (THREE) TIMES DAILY WITH MEALS., Disp: 270 tablet, Rfl: 0 .  glipiZIDE (GLUCOTROL) 10 MG 24 hr tablet, Take 10 mg by mouth 2 (two) times daily.  , Disp: , Rfl:  .  hydrocortisone cream 1 %, Apply 1 application topically 2 (two) times daily., Disp: , Rfl:  .  levothyroxine (SYNTHROID, LEVOTHROID) 88 MCG tablet, Take 88 mcg by mouth daily., Disp: , Rfl: 5 .  Magnesium Oxide 200 MG TABS, Take 1 tablet by mouth daily., Disp: , Rfl:  .  metFORMIN (GLUCOPHAGE) 1000 MG tablet, Take 1,000 mg by mouth 2 (two) times daily. , Disp: , Rfl:  .  Omega-3 Fatty Acids (FISH OIL) 1200 MG CAPS, Take 1,200 mg by mouth 2 (two) times daily. , Disp: , Rfl:  .  omeprazole (PRILOSEC) 20 MG capsule, Take 20 mg by mouth 2 (two) times daily. Takes prn, Disp: , Rfl:  .  rosuvastatin (CRESTOR) 20 MG tablet, Take 20 mg by mouth daily., Disp: , Rfl:   Past Medical History: Past Medical History:  Diagnosis Date  . Atherosclerosis  of aorta ([place])    a. 01/2017/03/2017 - noted on high res chest CTs.  . Breast cancer ([place])    a. Bilateral --> s/p left mastectomy  . Carotid artery disease ([place])    a. 08/7339 w/ 93-79% LICA stenosis and <02% RICA stenosis; b. 10/2015 Carotid U/S: < 50% BICA stenosis  . Chest pain    a. 09/2011 MV: EF 68%, no ischemia/infarct.  . Chronic anemia   . Chronic headaches    denies  . Coronary artery calcification seen on CT scan    a. 01/2017 High res CT: atherosclerotic calcification of the arterial vascularture, including severe involvement of the coronary arteries; b. 03/2017 CT Chest: coronary and Ao atheroscelrosis.  Marland Kitchen GERD (gastroesophageal reflux disease)   . History of echocardiogram    a. 09/2011 Echo: EF 55-60%, no rwma, triv AI, PASP 47mHg.  .Marland KitchenHyperlipidemia   . Hypertension   . Hyperthyroidism   . Left upper lobe pulmonary nodule    a. 02/2017 PET: slowing enlarging 1.7cm LUL nodule w/ low-grade metabolic activity; b. 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 05/2017 s/p VATS.  . Multinodular goiter    a. 02/2017 PET scan- Hypermetabolic nodule;  b. 25/3299s/p thyroidectomy  . Obesity   . OSA on CPAP    cpap  .  Osteoarthritis   . Personal history of radiation therapy 1999  . Right bundle branch block   . Type II or unspecified type diabetes mellitus without mention of complication, uncontrolled     Tobacco Use: Social History   Tobacco Use  Smoking Status Never Smoker  Smokeless Tobacco Never Used    Labs: Recent Review Flowsheet Data    Labs for ITP Cardiac and Pulmonary Rehab Latest Ref Rng & Units 10/04/2011 10/05/2011 05/08/2017 05/31/2017 06/05/2017   Cholestrol 0 - 200 mg/dL - 141 - - -   LDLCALC 0 - 99 mg/dL - 65 - - -   HDL >39 mg/dL - 49 - - -   Trlycerides <150 mg/dL - 137 - - -   Hemoglobin A1c 4.8 - 5.6 % 6.1(H) - 6.9(H) - -   PHART 7.350 - 7.450 - - - 7.465(H) 7.460(H)   PCO2ART 32.0 - 48.0 mmHg - - - 36.8 37.2   HCO3 20.0 - 28.0 mmol/L - - - 26.1 26.2    O2SAT % - - - 98.5 95.1      Capillary Blood Glucose: Lab Results  Component Value Date   GLUCAP 244 (H) 06/15/2017   GLUCAP 90 06/15/2017   GLUCAP 156 (H) 06/14/2017   GLUCAP 119 (H) 06/14/2017   GLUCAP 194 (H) 06/14/2017   POCT Glucose    Row Name 10/23/17 1242 11/06/17 1259           POCT Blood Glucose   Pre-Exercise  119 mg/dL  130 mg/dL      Post-Exercise  211 mg/dL  192 mg/dL         Pulmonary Assessment Scores: Pulmonary Assessment Scores    Row Name 09/27/17 1110 10/05/17 0845       ADL UCSD   ADL Phase  Entry  Entry    SOB Score total  42  -      CAT Score   CAT Score  5  -      mMRC Score   mMRC Score  -  0       Pulmonary Function Assessment:   Exercise Target Goals: Exercise Program Goal: Individual exercise prescription set using results from initial 6 min walk test and THRR while considering  patient's activity barriers and safety.   Exercise Prescription Goal: Initial exercise prescription builds to 30-45 minutes a day of aerobic activity, 2-3 days per week.  Home exercise guidelines will be given to patient during program as part of exercise prescription that the participant will acknowledge.  Activity Barriers & Risk Stratification: Activity Barriers & Cardiac Risk Stratification - 09/26/17 1452      Activity Barriers & Cardiac Risk Stratification   Activity Barriers  Arthritis;Shortness of Breath;Incisional Pain;Deconditioning   rt foot      6 Minute Walk: 6 Minute Walk    Row Name 10/05/17 0845         6 Minute Walk   Phase  Initial     Distance  720 feet     Walk Time  6 minutes     # of Rest Breaks  0     MPH  1.36     METS  2.07     RPE  11     Perceived Dyspnea   2     Symptoms  Yes (comment)     Comments  used wheelchair due to pain in foot 2/10 pain after ambulating     Resting HR  94 bpm  Resting BP  144/60     Resting Oxygen Saturation   96 %     Exercise Oxygen Saturation  during 6 min walk  96 %      Max Ex. HR  130 bpm     Max Ex. BP  144/64       Interval HR   1 Minute HR  113     2 Minute HR  123     3 Minute HR  123     4 Minute HR  130     5 Minute HR  129     6 Minute HR  123     2 Minute Post HR  109     Interval Heart Rate?  Yes       Interval Oxygen   Interval Oxygen?  Yes     Baseline Oxygen Saturation %  96 %     1 Minute Oxygen Saturation %  97 %     1 Minute Liters of Oxygen  0 L     2 Minute Oxygen Saturation %  96 %     2 Minute Liters of Oxygen  0 L     3 Minute Oxygen Saturation %  96 %     3 Minute Liters of Oxygen  0 L     4 Minute Oxygen Saturation %  96 %     4 Minute Liters of Oxygen  0 L     5 Minute Oxygen Saturation %  96 %     5 Minute Liters of Oxygen  0 L     6 Minute Oxygen Saturation %  96 %     6 Minute Liters of Oxygen  0 L     2 Minute Post Oxygen Saturation %  95 %     2 Minute Post Liters of Oxygen  0 L        Oxygen Initial Assessment: Oxygen Initial Assessment - 10/05/17 0844      Initial 6 min Walk   Oxygen Used  None      Program Oxygen Prescription   Program Oxygen Prescription  None       Oxygen Re-Evaluation: Oxygen Re-Evaluation    Row Name 11/05/17 0924             Program Oxygen Prescription   Program Oxygen Prescription  None         Home Oxygen   Home Oxygen Device  None       Sleep Oxygen Prescription  None       Home Exercise Oxygen Prescription  None       Home at Rest Exercise Oxygen Prescription  None          Oxygen Discharge (Final Oxygen Re-Evaluation): Oxygen Re-Evaluation - 11/05/17 0924      Program Oxygen Prescription   Program Oxygen Prescription  None      Home Oxygen   Home Oxygen Device  None    Sleep Oxygen Prescription  None    Home Exercise Oxygen Prescription  None    Home at Rest Exercise Oxygen Prescription  None       Initial Exercise Prescription: Initial Exercise Prescription - 10/05/17 0800      Date of Initial Exercise RX and Referring Provider   Date   10/04/17    Referring Provider  Dr. Chase Caller      NuStep   Level  2    SPM  80  Minutes  17    METs  1.5      Arm Ergometer   Level  1    Watts  10    Minutes  17      Track   Laps  8    Minutes  17      Prescription Details   Frequency (times per week)  2    Duration  Progress to 45 minutes of aerobic exercise without signs/symptoms of physical distress      Intensity   THRR 40-80% of Max Heartrate  58-116    Ratings of Perceived Exertion  11-13    Perceived Dyspnea  0-4      Progression   Progression  Continue progressive overload as per policy without signs/symptoms or physical distress.      Resistance Training   Training Prescription  Yes    Weight  orange bands    Reps  10-15       Perform Capillary Blood Glucose checks as needed.  Exercise Prescription Changes:  Exercise Prescription Changes    Row Name 10/23/17 1200 11/06/17 1200           Response to Exercise   Blood Pressure (Admit)  120/60  132/60      Blood Pressure (Exercise)  154/78  140/64      Blood Pressure (Exit)  122/62  122/58      Heart Rate (Admit)  78 bpm  82 bpm      Heart Rate (Exercise)  99 bpm  103 bpm      Heart Rate (Exit)  78 bpm  75 bpm      Oxygen Saturation (Admit)  98 %  99 %      Oxygen Saturation (Exercise)  96 %  99 %      Oxygen Saturation (Exit)  96 %  98 %      Rating of Perceived Exertion (Exercise)  13  13      Perceived Dyspnea (Exercise)  1  2      Duration  Progress to 45 minutes of aerobic exercise without signs/symptoms of physical distress  Progress to 45 minutes of aerobic exercise without signs/symptoms of physical distress      Intensity  THRR unchanged  THRR unchanged        Progression   Progression  Continue to progress workloads to maintain intensity without signs/symptoms of physical distress.  Continue to progress workloads to maintain intensity without signs/symptoms of physical distress.        Resistance Training   Training Prescription   Yes  Yes      Weight  orange bands  orange bands      Reps  10-15  10-15        NuStep   Level  3  3      SPM  80  80      Minutes  17  17      METs  2.8  2.7        Arm Ergometer   Level  1  3      Watts  10  10      Minutes  17  17        Track   Laps  -  8      Minutes  -  17         Exercise Comments:   Exercise Goals and Review:  Exercise Goals    Row Name 09/26/17 1453  Exercise Goals   Increase Physical Activity  Yes       Intervention  Provide advice, education, support and counseling about physical activity/exercise needs.;Develop an individualized exercise prescription for aerobic and resistive training based on initial evaluation findings, risk stratification, comorbidities and participant's personal goals.       Expected Outcomes  Short Term: Attend rehab on a regular basis to increase amount of physical activity.;Long Term: Add in home exercise to make exercise part of routine and to increase amount of physical activity.;Long Term: Exercising regularly at least 3-5 days a week.       Increase Strength and Stamina  Yes       Intervention  Develop an individualized exercise prescription for aerobic and resistive training based on initial evaluation findings, risk stratification, comorbidities and participant's personal goals.;Provide advice, education, support and counseling about physical activity/exercise needs.       Expected Outcomes  Short Term: Increase workloads from initial exercise prescription for resistance, speed, and METs.;Short Term: Perform resistance training exercises routinely during rehab and add in resistance training at home;Long Term: Improve cardiorespiratory fitness, muscular endurance and strength as measured by increased METs and functional capacity (6MWT)       Able to understand and use rate of perceived exertion (RPE) scale  Yes       Intervention  Provide education and explanation on how to use RPE scale       Expected  Outcomes  Short Term: Able to use RPE daily in rehab to express subjective intensity level;Long Term:  Able to use RPE to guide intensity level when exercising independently       Able to understand and use Dyspnea scale  Yes       Intervention  Provide education and explanation on how to use Dyspnea scale       Expected Outcomes  Short Term: Able to use Dyspnea scale daily in rehab to express subjective sense of shortness of breath during exertion;Long Term: Able to use Dyspnea scale to guide intensity level when exercising independently       Knowledge and understanding of Target Heart Rate Range (THRR)  Yes       Intervention  Provide education and explanation of THRR including how the numbers were predicted and where they are located for reference       Expected Outcomes  Short Term: Able to state/look up THRR;Long Term: Able to use THRR to govern intensity when exercising independently;Short Term: Able to use daily as guideline for intensity in rehab       Understanding of Exercise Prescription  Yes       Intervention  Provide education, explanation, and written materials on patient's individual exercise prescription       Expected Outcomes  Short Term: Able to explain program exercise prescription;Long Term: Able to explain home exercise prescription to exercise independently          Exercise Goals Re-Evaluation : Exercise Goals Re-Evaluation    [place] Name 11/05/17 0925             Exercise Goal Re-Evaluation   Exercise Goals Review  Increase Physical Activity;Increase Strength and Stamina;Able to understand and use rate of perceived exertion (RPE) scale;Able to understand and use Dyspnea scale;Knowledge and understanding of Target Heart Rate Range (THRR);Understanding of Exercise Prescription       Comments  Patient has only attended 4 rehab sessions. Will cont. to monitor and progress as able.       Expected Outcomes  Through exercise at rehab and at home, the patient will decrease  shortness of breath with daily activities and feel confident in carrying out an exercise regime at home.           Discharge Exercise Prescription (Final Exercise Prescription Changes): Exercise Prescription Changes - 11/06/17 1200      Response to Exercise   Blood Pressure (Admit)  132/60    Blood Pressure (Exercise)  140/64    Blood Pressure (Exit)  122/58    Heart Rate (Admit)  82 bpm    Heart Rate (Exercise)  103 bpm    Heart Rate (Exit)  75 bpm    Oxygen Saturation (Admit)  99 %    Oxygen Saturation (Exercise)  99 %    Oxygen Saturation (Exit)  98 %    Rating of Perceived Exertion (Exercise)  13    Perceived Dyspnea (Exercise)  2    Duration  Progress to 45 minutes of aerobic exercise without signs/symptoms of physical distress    Intensity  THRR unchanged      Progression   Progression  Continue to progress workloads to maintain intensity without signs/symptoms of physical distress.      Resistance Training   Training Prescription  Yes    Weight  orange bands    Reps  10-15      NuStep   Level  3    SPM  80    Minutes  17    METs  2.7      Arm Ergometer   Level  3    Watts  10    Minutes  17      Track   Laps  8    Minutes  17       Nutrition:  Target Goals: Understanding of nutrition guidelines, daily intake of sodium 1500mg , cholesterol 200mg , calories 30% from fat and 7% or less from saturated fats, daily to have 5 or more servings of fruits and vegetables.  Biometrics:    Nutrition Therapy Plan and Nutrition Goals: Nutrition Therapy & Goals - 10/01/17 1335      Nutrition Therapy   Diet  general, healthful      Personal Nutrition Goals   Nutrition Goal  pt will recognize symptoms that can interfere with adequate oral intake    Personal Goal #2  pt will consume high-energy, high-nutrient dense beverages when necessary to compensate for decreased oral intake of solid foods      Intervention Plan   Intervention  Prescribe, educate and counsel  regarding individualized specific dietary modifications aiming towards targeted core components such as weight, hypertension, lipid management, diabetes, heart failure and other comorbidities.    Expected Outcomes  Short Term Goal: Understand basic principles of dietary content, such as calories, fat, sodium, cholesterol and nutrients.       Nutrition Assessments: Nutrition Assessments - 10/01/17 1326      Rate Your Plate Scores   Pre Score  41       Nutrition Goals Re-Evaluation: Nutrition Goals Re-Evaluation    [place] Name 10/01/17 1343             Goals   Nutrition Goal  pt will recognize symptions that can interfere with adequate oral intake          Nutrition Goals Discharge (Final Nutrition Goals Re-Evaluation): Nutrition Goals Re-Evaluation - 10/01/17 1343      Goals   Nutrition Goal  pt will recognize symptions that can interfere with adequate oral  intake       Psychosocial: Target Goals: Acknowledge presence or absence of significant depression and/or stress, maximize coping skills, provide positive support system. Participant is able to verbalize types and ability to use techniques and skills needed for reducing stress and depression.  Initial Review & Psychosocial Screening: Initial Psych Review & Screening - 11/06/17 0955      Initial Review   Current issues with  None Identified      Family Dynamics   Good Support System?  Yes    Comments  faith, children, sisters      Barriers   Psychosocial barriers to participate in program  There are no identifiable barriers or psychosocial needs.      Screening Interventions   Interventions  Encouraged to exercise       Quality of Life Scores:  Scores of 19 and below usually indicate a poorer quality of life in these areas.  A difference of  2-3 points is a clinically meaningful difference.  A difference of 2-3 points in the total score of the Quality of Life Index has been associated with significant  improvement in overall quality of life, self-image, physical symptoms, and general health in studies assessing change in quality of life.  PHQ-9: Recent Review Flowsheet Data    Depression screen [facility] 2/9 10/11/2017 09/26/2017   Decreased Interest 0 0   Down, Depressed, Hopeless 0 0   PHQ - 2 Score 0 0   Altered sleeping 0 0   Tired, decreased energy 1 1   Change in appetite 2 3   Feeling bad or failure about yourself  0 0   Trouble concentrating 0 0   Moving slowly or fidgety/restless 1 0   Suicidal thoughts 0 0   PHQ-9 Score 4 4   Difficult doing work/chores Not difficult at all Not difficult at all     Interpretation of Total Score  Total Score Depression Severity:  1-4 = Minimal depression, 5-9 = Mild depression, 10-14 = Moderate depression, 15-19 = Moderately severe depression, 20-27 = Severe depression   Psychosocial Evaluation and Intervention: Psychosocial Evaluation - 11/06/17 1000      Psychosocial Evaluation & Interventions   Interventions  Encouraged to exercise with the program and follow exercise prescription    Expected Outcomes  Pt will continue to display positive outlook on life with healthy coping skills    Continue Psychosocial Services   Follow up required by staff       Psychosocial Re-Evaluation: Psychosocial Re-Evaluation    [place] Name 11/06/17 1000             Psychosocial Re-Evaluation   Current issues with  None Identified       Comments  no identifiable barriers to particpate        Expected Outcomes  Continue to display positve coping skills and healthy outlook on life with the appropriate level of support from her family       Interventions  Relaxation education;Stress management education;Encouraged to attend Pulmonary Rehabilitation for the exercise          Psychosocial Discharge (Final Psychosocial Re-Evaluation): Psychosocial Re-Evaluation - 11/06/17 1000      Psychosocial Re-Evaluation   Current issues with  None Identified     Comments  no identifiable barriers to particpate     Expected Outcomes  Continue to display positve coping skills and healthy outlook on life with the appropriate level of support from her family    Interventions  Relaxation education;Stress  management education;Encouraged to attend Pulmonary Rehabilitation for the exercise       Education: Education Goals: Education classes will be provided on a weekly basis, covering required topics. Participant will state understanding/return demonstration of topics presented.  Learning Barriers/Preferences: Learning Barriers/Preferences - 09/26/17 1451      Learning Barriers/Preferences   Learning Barriers  Sight    Learning Preferences  Computer/Internet;Group Instruction;Individual Instruction;Verbal Instruction;Video;Written Material       Education Topics: Risk Factor Reduction:  -Group instruction that is supported by a PowerPoint presentation. Instructor discusses the definition of a risk factor, different risk factors for pulmonary disease, and how the heart and lungs work together.     Nutrition for Pulmonary Patient:  -Group instruction provided by PowerPoint slides, verbal discussion, and written materials to support subject matter. The instructor gives an explanation and review of healthy diet recommendations, which includes a discussion on weight management, recommendations for fruit and vegetable consumption, as well as protein, fluid, caffeine, fiber, sodium, sugar, and alcohol. Tips for eating when patients are short of breath are discussed.   Pursed Lip Breathing:  -Group instruction that is supported by demonstration and informational handouts. Instructor discusses the benefits of pursed lip and diaphragmatic breathing and detailed demonstration on how to preform both.     Oxygen Safety:  -Group instruction provided by PowerPoint, verbal discussion, and written material to support subject matter. There is an overview of "What is  Oxygen" and "Why do we need it".  Instructor also reviews how to create a safe environment for oxygen use, the importance of using oxygen as prescribed, and the risks of noncompliance. There is a brief discussion on traveling with oxygen and resources the patient may utilize.   Oxygen Equipment:  -Group instruction provided by [facility] Staff utilizing handouts, written materials, and equipment demonstrations.   PULMONARY REHAB OTHER RESPIRATORY from 11/01/2017 in [place]  Date  10/11/17  Educator  Ace Gins  Instruction Review Code  2- Demonstrated Understanding      Signs and Symptoms:  -Group instruction provided by written material and verbal discussion to support subject matter. Warning signs and symptoms of infection, stroke, and heart attack are reviewed and when to call the physician/911 reinforced. Tips for preventing the spread of infection discussed.   Advanced Directives:  -Group instruction provided by verbal instruction and written material to support subject matter. Instructor reviews Advanced Directive laws and proper instruction for filling out document.   Pulmonary Video:  -Group video education that reviews the importance of medication and oxygen compliance, exercise, good nutrition, pulmonary hygiene, and pursed lip and diaphragmatic breathing for the pulmonary patient.   Exercise for the Pulmonary Patient:  -Group instruction that is supported by a PowerPoint presentation. Instructor discusses benefits of exercise, core components of exercise, frequency, duration, and intensity of an exercise routine, importance of utilizing pulse oximetry during exercise, safety while exercising, and options of places to exercise outside of rehab.     PULMONARY REHAB OTHER RESPIRATORY from 11/01/2017 in [place]  Date  10/18/17  Educator  Cloyde Reams  Instruction Review Code  1- Verbalizes Understanding      Pulmonary  Medications:  -Verbally interactive group education provided by instructor with focus on inhaled medications and proper administration.   Anatomy and Physiology of the Respiratory System and Intimacy:  -Group instruction provided by PowerPoint, verbal discussion, and written material to support subject matter. Instructor reviews respiratory cycle and anatomical components of the respiratory  system and their functions. Instructor also reviews differences in obstructive and restrictive respiratory diseases with examples of each. Intimacy, Sex, and Sexuality differences are reviewed with a discussion on how relationships can change when diagnosed with pulmonary disease. Common sexual concerns are reviewed.   MD DAY -A group question and answer session with a medical doctor that allows participants to ask questions that relate to their pulmonary disease state.   PULMONARY REHAB OTHER RESPIRATORY from 11/01/2017 in [place]  Date  10/23/17  Educator  [email or web address]  Instruction Review Code  2- Demonstrated Understanding      OTHER EDUCATION -Group or individual verbal, written, or video instructions that support the educational goals of the pulmonary rehab program.   Holiday Eating Survival Tips:  -Group instruction provided by PowerPoint slides, verbal discussion, and written materials to support subject matter. The instructor gives patients tips, tricks, and techniques to help them not only survive but enjoy the holidays despite the onslaught of food that accompanies the holidays.   Knowledge Questionnaire Score: Knowledge Questionnaire Score - 09/27/17 1459      Knowledge Questionnaire Score   Pre Score  --   pre score      Core Components/Risk Factors/Patient Goals at Admission: Personal Goals and Risk Factors at Admission - 09/26/17 1436      Core Components/Risk Factors/Patient Goals on Admission    Weight Management  Weight Maintenance;Yes     Intervention  Weight Management: Provide education and appropriate resources to help participant work on and attain dietary goals.;Weight Management: Develop a combined nutrition and exercise program designed to reach desired caloric intake, while maintaining appropriate intake of nutrient and fiber, sodium and fats, and appropriate energy expenditure required for the weight goal.    Expected Outcomes  Short Term: Continue to assess and modify interventions until short term weight is achieved;Weight Maintenance: Understanding of the daily nutrition guidelines, which includes 25-35% calories from fat, 7% or less cal from saturated fats, less than 200mg  cholesterol, less than 1.5gm of sodium, & 5 or more servings of fruits and vegetables daily;Understanding recommendations for meals to include 15-35% energy as protein, 25-35% energy from fat, 35-60% energy from carbohydrates, less than 200mg  of dietary cholesterol, 20-35 gm of total fiber daily;Understanding of distribution of calorie intake throughout the day with the consumption of 4-5 meals/snacks    Improve shortness of breath with ADL's  Yes    Intervention  Provide education, individualized exercise plan and daily activity instruction to help decrease symptoms of SOB with activities of daily living.    Expected Outcomes  Short Term: Improve cardiorespiratory fitness to achieve a reduction of symptoms when performing ADLs;Long Term: Be able to perform more ADLs without symptoms or delay the onset of symptoms    Diabetes  Yes    Intervention  Provide education about signs/symptoms and action to take for hypo/hyperglycemia.;Provide education about proper nutrition, including hydration, and aerobic/resistive exercise prescription along with prescribed medications to achieve blood glucose in normal ranges: Fasting glucose 65-99 mg/dL    Expected Outcomes  Short Term: Participant verbalizes understanding of the signs/symptoms and immediate care of  hyper/hypoglycemia, proper foot care and importance of medication, aerobic/resistive exercise and nutrition plan for blood glucose control.;Long Term: Attainment of HbA1C < 7%.       Core Components/Risk Factors/Patient Goals Review:  Goals and Risk Factor Review    Row Name 11/06/17 1002 11/07/17 0930           Core Components/Risk Factors/Patient  Goals Review   Personal Goals Review  Weight Management/Obesity;Improve shortness of breath with ADL's;Increase knowledge of respiratory medications and ability to use respiratory devices properly.;Develop more efficient breathing techniques such as purse lipped breathing and diaphragmatic breathing and practicing self-pacing with activity.;Diabetes  -      Review  Pt has completed 4 exercise sessions since starting on 7/25 and 1 incomplete due to elevation of CBG 357. Pt has a rheumatolgy consult to determine the cause of her discomfort to her great toe.  Unable to adequately assess pt progress toward pulmnary goals.  I anticipate pt will show pogress toward goals and improved managment of her diabetes  -      Expected Outcomes  See admission goals  See Admission Goals/Outcomes         Core Components/Risk Factors/Patient Goals at Discharge (Final Review):  Goals and Risk Factor Review - 11/07/17 0930      Core Components/Risk Factors/Patient Goals Review   Expected Outcomes  See Admission Goals/Outcomes       ITP Comments: ITP Comments    Row Name 09/26/17 1423 11/07/17 0930         ITP Comments  Dr. Jennet Maduro, Medical Director  Dr. Jennet Maduro, Medical Director         Comments:  Pt has completed 4 exercise sessions and 1 incomplete due to elevation of CBG - 357. Cherre Huger, BSN Cardiac and Training and development officer

## 2017-11-08 ENCOUNTER — Encounter (HOSPITAL_COMMUNITY): Payer: Medicare Other

## 2017-11-08 DIAGNOSIS — R768 Other specified abnormal immunological findings in serum: Secondary | ICD-10-CM | POA: Diagnosis not present

## 2017-11-08 DIAGNOSIS — M7989 Other specified soft tissue disorders: Secondary | ICD-10-CM | POA: Diagnosis not present

## 2017-11-08 DIAGNOSIS — M064 Inflammatory polyarthropathy: Secondary | ICD-10-CM | POA: Diagnosis not present

## 2017-11-08 DIAGNOSIS — M79673 Pain in unspecified foot: Secondary | ICD-10-CM | POA: Diagnosis not present

## 2017-11-13 ENCOUNTER — Encounter (HOSPITAL_COMMUNITY)
Admission: RE | Admit: 2017-11-13 | Discharge: 2017-11-13 | Disposition: A | Discharge status: Home / Self Care | Payer: Medicare Other | Source: Ambulatory Visit | Attending: Internal Medicine | Admitting: Internal Medicine

## 2017-11-13 DIAGNOSIS — I10 Essential (primary) hypertension: Secondary | ICD-10-CM | POA: Diagnosis not present

## 2017-11-13 DIAGNOSIS — J438 Other emphysema: Secondary | ICD-10-CM | POA: Diagnosis not present

## 2017-11-13 DIAGNOSIS — J439 Emphysema, unspecified: Secondary | ICD-10-CM

## 2017-11-13 DIAGNOSIS — K219 Gastro-esophageal reflux disease without esophagitis: Secondary | ICD-10-CM | POA: Diagnosis not present

## 2017-11-13 DIAGNOSIS — E785 Hyperlipidemia, unspecified: Secondary | ICD-10-CM | POA: Diagnosis not present

## 2017-11-13 DIAGNOSIS — I700 Atherosclerosis of aorta: Secondary | ICD-10-CM | POA: Diagnosis not present

## 2017-11-13 DIAGNOSIS — I2510 Atherosclerotic heart disease of native coronary artery without angina pectoris: Secondary | ICD-10-CM | POA: Diagnosis not present

## 2017-11-13 NOTE — Progress Notes (Signed)
Daily Session Note  Patient Details  Name: Karen West MRN: [id number] Date of Birth: [birth year] Referring Provider:     Pulmonary Rehab Walk Test from 10/04/2017 in [place]  Referring Provider  Dr. Chase Caller      Encounter Date: 11/13/2017  Check In: Session Check In - 11/13/17 1151      Check-In   Supervising physician immediately available to respond to emergencies  [facility] immediately available    Physician(s)  Dr. Jonnie Finner     Location  [facility]    Staff Present  Su Hilt, MS, ACSM [name], Exercise Physiologist;Carlette Wilber Oliphant, RN, BSN;Lisa [name], RN;Olinty [place], MS, ACSM CEP, Exercise Physiologist;Joann Rion, RN, BSN    Medication changes reported      No    Fall or balance concerns reported     No    Tobacco Cessation  No Change    Warm-up and Cool-down  Performed as group-led instruction    Resistance Training Performed  Yes    VAD Patient?  No    PAD/SET Patient?  No      Pain Assessment   Currently in Pain?  No/denies       Capillary Blood Glucose: No results found for this or any previous visit (from the past 24 hour(s)).    Social History   Tobacco Use  Smoking Status Never Smoker  Smokeless Tobacco Never Used    Goals Met:  Exercise tolerated well  Goals Unmet:  Not Applicable  Comments: Service time is from 10:30a to 12:15p    Dr. Rush Farmer is Medical Director for Pulmonary Rehab at [facility].

## 2017-11-15 ENCOUNTER — Encounter (HOSPITAL_COMMUNITY)
Admission: RE | Admit: 2017-11-15 | Discharge: 2017-11-15 | Disposition: A | Discharge status: Home / Self Care | Payer: Medicare Other | Source: Ambulatory Visit | Attending: Internal Medicine | Admitting: Internal Medicine

## 2017-11-15 DIAGNOSIS — J438 Other emphysema: Secondary | ICD-10-CM | POA: Diagnosis not present

## 2017-11-15 DIAGNOSIS — I700 Atherosclerosis of aorta: Secondary | ICD-10-CM | POA: Diagnosis not present

## 2017-11-15 DIAGNOSIS — E785 Hyperlipidemia, unspecified: Secondary | ICD-10-CM | POA: Diagnosis not present

## 2017-11-15 DIAGNOSIS — K219 Gastro-esophageal reflux disease without esophagitis: Secondary | ICD-10-CM | POA: Diagnosis not present

## 2017-11-15 DIAGNOSIS — J439 Emphysema, unspecified: Secondary | ICD-10-CM

## 2017-11-15 DIAGNOSIS — I2510 Atherosclerotic heart disease of native coronary artery without angina pectoris: Secondary | ICD-10-CM | POA: Diagnosis not present

## 2017-11-15 DIAGNOSIS — I10 Essential (primary) hypertension: Secondary | ICD-10-CM | POA: Diagnosis not present

## 2017-11-15 NOTE — Progress Notes (Signed)
Daily Session Note  Patient Details  Name: Karen West MRN: [id number] Date of Birth: 02/24/1941 Referring Provider:     Pulmonary Rehab Walk Test from 10/04/2017 in [place]  Referring Provider  Dr. Chase Caller      Encounter Date: 11/15/2017  Check In: Session Check In - 11/15/17 1100      Check-In   Supervising physician immediately available to respond to emergencies  [facility] immediately available    Physician(s)  Dr. Florene Glen    Location  [facility]    Staff Present  Su Hilt, MS, ACSM [name], Exercise Physiologist;Carlette Wilber Oliphant, RN, BSN;Ramon Dredge, RN, MHA    Medication changes reported      No    Fall or balance concerns reported     No    Tobacco Cessation  No Change    Warm-up and Cool-down  Performed as group-led instruction    Resistance Training Performed  Yes    VAD Patient?  No    PAD/SET Patient?  No      Pain Assessment   Currently in Pain?  No/denies    Multiple Pain Sites  No       Capillary Blood Glucose: No results found for this or any previous visit (from the past 24 hour(s)).    Social History   Tobacco Use  Smoking Status Never Smoker  Smokeless Tobacco Never Used    Goals Met:  Exercise tolerated well  Goals Unmet:  Not Applicable  Comments: Service time is from 10:30a to 12:45p    Dr. Rush Farmer is Medical Director for Pulmonary Rehab at [facility].

## 2017-11-15 NOTE — Progress Notes (Signed)
I have reviewed a Home Exercise Prescription with Karen West . Karen West is currently exercising at home.  The patient was advised to walk 2-3 days a week for 20 minutes.  Deneise Lever and I discussed how to progress their exercise prescription.  The patient stated that their goals were to achieve better breathing without pain, increase walking endurance, and be able to walk in the grocery store.  The patient stated that they understand the exercise prescription.  We reviewed exercise guidelines, target heart rate during exercise, RPE Scale, weather conditions, NTG use, endpoints for exercise, warmup and cool down.  Patient is encouraged to come to me with any questions. I will continue to follow up with the patient to assist them with progression and safety.

## 2017-11-20 ENCOUNTER — Encounter (HOSPITAL_COMMUNITY)
Admission: RE | Admit: 2017-11-20 | Discharge: 2017-11-20 | Disposition: A | Discharge status: Home / Self Care | Payer: Medicare Other | Source: Ambulatory Visit | Attending: Internal Medicine | Admitting: Internal Medicine

## 2017-11-20 ENCOUNTER — Ambulatory Visit: Payer: Medicare Other | Admitting: Rheumatology

## 2017-11-20 VITALS — Wt 145.9 lb

## 2017-11-20 DIAGNOSIS — Z923 Personal history of irradiation: Secondary | ICD-10-CM | POA: Diagnosis not present

## 2017-11-20 DIAGNOSIS — E785 Hyperlipidemia, unspecified: Secondary | ICD-10-CM | POA: Insufficient documentation

## 2017-11-20 DIAGNOSIS — J438 Other emphysema: Secondary | ICD-10-CM | POA: Diagnosis not present

## 2017-11-20 DIAGNOSIS — I2510 Atherosclerotic heart disease of native coronary artery without angina pectoris: Secondary | ICD-10-CM | POA: Insufficient documentation

## 2017-11-20 DIAGNOSIS — Z79890 Hormone replacement therapy (postmenopausal): Secondary | ICD-10-CM | POA: Diagnosis not present

## 2017-11-20 DIAGNOSIS — I4510 Unspecified right bundle-branch block: Secondary | ICD-10-CM | POA: Diagnosis not present

## 2017-11-20 DIAGNOSIS — I10 Essential (primary) hypertension: Secondary | ICD-10-CM | POA: Diagnosis not present

## 2017-11-20 DIAGNOSIS — Z79899 Other long term (current) drug therapy: Secondary | ICD-10-CM | POA: Insufficient documentation

## 2017-11-20 DIAGNOSIS — G4733 Obstructive sleep apnea (adult) (pediatric): Secondary | ICD-10-CM | POA: Insufficient documentation

## 2017-11-20 DIAGNOSIS — E0590 Thyrotoxicosis, unspecified without thyrotoxic crisis or storm: Secondary | ICD-10-CM | POA: Diagnosis not present

## 2017-11-20 DIAGNOSIS — K219 Gastro-esophageal reflux disease without esophagitis: Secondary | ICD-10-CM | POA: Insufficient documentation

## 2017-11-20 DIAGNOSIS — Z7982 Long term (current) use of aspirin: Secondary | ICD-10-CM | POA: Diagnosis not present

## 2017-11-20 DIAGNOSIS — Z7984 Long term (current) use of oral hypoglycemic drugs: Secondary | ICD-10-CM | POA: Insufficient documentation

## 2017-11-20 DIAGNOSIS — I700 Atherosclerosis of aorta: Secondary | ICD-10-CM | POA: Insufficient documentation

## 2017-11-20 DIAGNOSIS — E042 Nontoxic multinodular goiter: Secondary | ICD-10-CM | POA: Diagnosis not present

## 2017-11-20 DIAGNOSIS — J439 Emphysema, unspecified: Secondary | ICD-10-CM

## 2017-11-20 NOTE — Progress Notes (Signed)
Daily Session Note  Patient Details  Name: WINNIFRED DUFFORD MRN: [id number] Date of Birth: 23-Jul-1940 Referring Provider:     Pulmonary Rehab Walk Test from 10/04/2017 in [place]  Referring Provider  Dr. Chase Caller      Encounter Date: 11/20/2017  Check In: Session Check In - 11/20/17 1217      Check-In   Supervising physician immediately available to respond to emergencies  [facility] immediately available    Physician(s)  Dr. Karleen Hampshire    Location  [facility]    Staff Present  Su Hilt, MS, ACSM [name], Exercise Physiologist;Carlette Wilber Oliphant, RN, BSN;Ramon Dredge, RN, MHA;Lisa [name], RN    Medication changes reported      No    Fall or balance concerns reported     No    Tobacco Cessation  No Change    Warm-up and Cool-down  Performed as group-led instruction    Resistance Training Performed  Yes    VAD Patient?  No    PAD/SET Patient?  No      Pain Assessment   Currently in Pain?  No/denies    Multiple Pain Sites  No       Capillary Blood Glucose: No results found for this or any previous visit (from the past 24 hour(s)). POCT Glucose - 11/20/17 1224      POCT Blood Glucose   Pre-Exercise  127 mg/dL    Post-Exercise  165 mg/dL      Exercise Prescription Changes - 11/20/17 1200      Response to Exercise   Blood Pressure (Admit)  122/68    Blood Pressure (Exercise)  142/60    Blood Pressure (Exit)  118/60    Heart Rate (Admit)  80 bpm    Heart Rate (Exercise)  113 bpm    Heart Rate (Exit)  73 bpm    Oxygen Saturation (Admit)  99 %    Oxygen Saturation (Exercise)  95 %    Oxygen Saturation (Exit)  99 %    Rating of Perceived Exertion (Exercise)  13    Perceived Dyspnea (Exercise)  2    Duration  Progress to 45 minutes of aerobic exercise without signs/symptoms of physical distress    Intensity  THRR unchanged      Progression   Progression  Continue to progress workloads to maintain intensity  without signs/symptoms of physical distress.      Resistance Training   Training Prescription  Yes    Weight  orange bands    Reps  10-15    Time  10 Minutes      Interval Training   Interval Training  No      NuStep   Level  2    SPM  80    Minutes  17    METs  1.9      Track   Laps  10    Minutes  17      Home Exercise Plan   Plans to continue exercise at  --    Frequency  --       Social History   Tobacco Use  Smoking Status Never Smoker  Smokeless Tobacco Never Used    Goals Met:  Exercise tolerated well  Goals Unmet:  Not Applicable  Comments: Service time is from 10:30a to 12:15p    Dr. Rush Farmer is Medical Director for Pulmonary Rehab at [facility].

## 2017-11-22 ENCOUNTER — Encounter (HOSPITAL_COMMUNITY)
Admission: RE | Admit: 2017-11-22 | Discharge: 2017-11-22 | Disposition: A | Discharge status: Home / Self Care | Payer: Medicare Other | Source: Ambulatory Visit | Attending: Internal Medicine | Admitting: Internal Medicine

## 2017-11-22 DIAGNOSIS — I700 Atherosclerosis of aorta: Secondary | ICD-10-CM | POA: Diagnosis not present

## 2017-11-22 DIAGNOSIS — K219 Gastro-esophageal reflux disease without esophagitis: Secondary | ICD-10-CM | POA: Diagnosis not present

## 2017-11-22 DIAGNOSIS — J438 Other emphysema: Secondary | ICD-10-CM | POA: Diagnosis not present

## 2017-11-22 DIAGNOSIS — I2510 Atherosclerotic heart disease of native coronary artery without angina pectoris: Secondary | ICD-10-CM | POA: Diagnosis not present

## 2017-11-22 DIAGNOSIS — E785 Hyperlipidemia, unspecified: Secondary | ICD-10-CM | POA: Diagnosis not present

## 2017-11-22 DIAGNOSIS — J439 Emphysema, unspecified: Secondary | ICD-10-CM

## 2017-11-22 DIAGNOSIS — I10 Essential (primary) hypertension: Secondary | ICD-10-CM | POA: Diagnosis not present

## 2017-11-22 NOTE — Progress Notes (Signed)
Daily Session Note  Patient Details  Name: Karen West MRN: [id number] Date of Birth: Mar 14, 1941 Referring Provider:     Pulmonary Rehab Walk Test from 10/04/2017 in [place]  Referring Provider  Dr. Chase Caller      Encounter Date: 11/22/2017  Check In: Session Check In - 11/22/17 1139      Check-In   Supervising physician immediately available to respond to emergencies  [facility] immediately available    Physician(s)  Dr. Darrick Meigs     Location  [facility]    Staff Present  Rodney Langton, RN;Carlette Wilber Oliphant, RN, BSN;Joann Rion, RN, BSN;Ramon Dredge, RN, MHA    Medication changes reported      No    Fall or balance concerns reported     No    Tobacco Cessation  No Change    Warm-up and Cool-down  Performed as group-led instruction    Resistance Training Performed  Yes    VAD Patient?  No    PAD/SET Patient?  No      Pain Assessment   Currently in Pain?  No/denies       Capillary Blood Glucose: No results found for this or any previous visit (from the past 24 hour(s)).    Social History   Tobacco Use  Smoking Status Never Smoker  Smokeless Tobacco Never Used    Goals Met:  Exercise tolerated well No report of cardiac concerns or symptoms Strength training completed today  Goals Unmet:  Not Applicable  Comments: Service time is from 1030 to 1150    Dr. Rush Farmer is Medical Director for Pulmonary Rehab at [facility].

## 2017-11-27 ENCOUNTER — Encounter (HOSPITAL_COMMUNITY)
Admission: RE | Admit: 2017-11-27 | Discharge: 2017-11-27 | Disposition: A | Discharge status: Home / Self Care | Payer: Medicare Other | Source: Ambulatory Visit | Attending: Internal Medicine | Admitting: Internal Medicine

## 2017-11-27 DIAGNOSIS — I2510 Atherosclerotic heart disease of native coronary artery without angina pectoris: Secondary | ICD-10-CM | POA: Diagnosis not present

## 2017-11-27 DIAGNOSIS — E785 Hyperlipidemia, unspecified: Secondary | ICD-10-CM | POA: Diagnosis not present

## 2017-11-27 DIAGNOSIS — I700 Atherosclerosis of aorta: Secondary | ICD-10-CM | POA: Diagnosis not present

## 2017-11-27 DIAGNOSIS — I10 Essential (primary) hypertension: Secondary | ICD-10-CM | POA: Diagnosis not present

## 2017-11-27 DIAGNOSIS — J439 Emphysema, unspecified: Secondary | ICD-10-CM

## 2017-11-27 DIAGNOSIS — K219 Gastro-esophageal reflux disease without esophagitis: Secondary | ICD-10-CM | POA: Diagnosis not present

## 2017-11-27 DIAGNOSIS — J438 Other emphysema: Secondary | ICD-10-CM | POA: Diagnosis not present

## 2017-11-27 NOTE — Progress Notes (Signed)
Daily Session Note  Patient Details  Name: Karen West MRN: [id number] Date of Birth: 02/14/1941 Referring Provider:     Pulmonary Rehab Walk Test from 10/04/2017 in [place]  Referring Provider  Dr. Chase Caller      Encounter Date: 11/27/2017  Check In: Session Check In - 11/27/17 1044      Check-In   Supervising physician immediately available to respond to emergencies  [facility] immediately available    Physician(s)  Dr. Darrick Meigs     Location  [facility]    Staff Present  Rodney Langton, RN;Carlette Wilber Oliphant, RN, BSN;Ramon Dredge, RN, MHA;Jaliza Seifried, MS, ACSM [name], Exercise Physiologist    Medication changes reported      No    Fall or balance concerns reported     No    Tobacco Cessation  No Change    Warm-up and Cool-down  Performed as group-led instruction    Resistance Training Performed  Yes    VAD Patient?  No    PAD/SET Patient?  No      Pain Assessment   Currently in Pain?  No/denies    Multiple Pain Sites  No       Capillary Blood Glucose: No results found for this or any previous visit (from the past 24 hour(s)).    Social History   Tobacco Use  Smoking Status Never Smoker  Smokeless Tobacco Never Used    Goals Met:  Exercise tolerated well  Goals Unmet:  Not Applicable  Comments: Service time is from 10:30a to 12:20p    Dr. Rush Farmer is Medical Director for Pulmonary Rehab at [facility].

## 2017-11-28 DIAGNOSIS — E890 Postprocedural hypothyroidism: Secondary | ICD-10-CM | POA: Diagnosis not present

## 2017-11-28 DIAGNOSIS — E039 Hypothyroidism, unspecified: Secondary | ICD-10-CM | POA: Diagnosis not present

## 2017-11-28 DIAGNOSIS — I10 Essential (primary) hypertension: Secondary | ICD-10-CM | POA: Diagnosis not present

## 2017-11-28 DIAGNOSIS — E119 Type 2 diabetes mellitus without complications: Secondary | ICD-10-CM | POA: Diagnosis not present

## 2017-11-29 ENCOUNTER — Encounter (HOSPITAL_COMMUNITY)
Admission: RE | Admit: 2017-11-29 | Discharge: 2017-11-29 | Disposition: A | Discharge status: Home / Self Care | Payer: Medicare Other | Source: Ambulatory Visit | Attending: Internal Medicine | Admitting: Internal Medicine

## 2017-11-29 DIAGNOSIS — I700 Atherosclerosis of aorta: Secondary | ICD-10-CM | POA: Diagnosis not present

## 2017-11-29 DIAGNOSIS — R768 Other specified abnormal immunological findings in serum: Secondary | ICD-10-CM | POA: Diagnosis not present

## 2017-11-29 DIAGNOSIS — J438 Other emphysema: Secondary | ICD-10-CM | POA: Diagnosis not present

## 2017-11-29 DIAGNOSIS — J439 Emphysema, unspecified: Secondary | ICD-10-CM

## 2017-11-29 DIAGNOSIS — M064 Inflammatory polyarthropathy: Secondary | ICD-10-CM | POA: Diagnosis not present

## 2017-11-29 DIAGNOSIS — K219 Gastro-esophageal reflux disease without esophagitis: Secondary | ICD-10-CM | POA: Diagnosis not present

## 2017-11-29 DIAGNOSIS — M79673 Pain in unspecified foot: Secondary | ICD-10-CM | POA: Diagnosis not present

## 2017-11-29 DIAGNOSIS — I2510 Atherosclerotic heart disease of native coronary artery without angina pectoris: Secondary | ICD-10-CM | POA: Diagnosis not present

## 2017-11-29 DIAGNOSIS — E785 Hyperlipidemia, unspecified: Secondary | ICD-10-CM | POA: Diagnosis not present

## 2017-11-29 DIAGNOSIS — I10 Essential (primary) hypertension: Secondary | ICD-10-CM | POA: Diagnosis not present

## 2017-11-29 DIAGNOSIS — M7989 Other specified soft tissue disorders: Secondary | ICD-10-CM | POA: Diagnosis not present

## 2017-11-29 NOTE — Progress Notes (Signed)
Daily Session Note  Patient Details  Name: Karen West MRN: [id number] Date of Birth: 1940-03-29 Referring Provider:     Pulmonary Rehab Walk Test from 10/04/2017 in [place]  Referring Provider  Dr. Chase Caller      Encounter Date: 11/29/2017  Check In: Session Check In - 11/29/17 1219      Check-In   Supervising physician immediately available to respond to emergencies  [facility] immediately available    Physician(s)  Dr. Dyann Kief    Location  [facility]    Staff Present  Maurice Small, RN, BSN;Hakan Nudelman, MS, ACSM [name], Exercise Physiologist;Lisa [name], [name], RN, MHA    Medication changes reported      No    Fall or balance concerns reported     No    Tobacco Cessation  No Change    Warm-up and Cool-down  Performed as group-led instruction    Resistance Training Performed  Yes    VAD Patient?  No    PAD/SET Patient?  No      Pain Assessment   Currently in Pain?  No/denies    Pain Score  0-No pain       Capillary Blood Glucose: No results found for this or any previous visit (from the past 24 hour(s)).    Social History   Tobacco Use  Smoking Status Never Smoker  Smokeless Tobacco Never Used    Goals Met:  Exercise tolerated well  Goals Unmet:  Not Applicable  Comments: Service time is from 10:30a to 12:05p    Dr. Rush Farmer is Medical Director for Pulmonary Rehab at [facility].

## 2017-12-03 ENCOUNTER — Other Ambulatory Visit: Payer: Self-pay | Admitting: Internal Medicine

## 2017-12-03 DIAGNOSIS — I6529 Occlusion and stenosis of unspecified carotid artery: Secondary | ICD-10-CM

## 2017-12-04 ENCOUNTER — Encounter (HOSPITAL_COMMUNITY)
Admission: RE | Admit: 2017-12-04 | Discharge: 2017-12-04 | Disposition: A | Discharge status: Home / Self Care | Payer: Medicare Other | Source: Ambulatory Visit | Attending: Internal Medicine | Admitting: Internal Medicine

## 2017-12-04 VITALS — Wt 146.8 lb

## 2017-12-04 DIAGNOSIS — J439 Emphysema, unspecified: Secondary | ICD-10-CM

## 2017-12-04 DIAGNOSIS — I2510 Atherosclerotic heart disease of native coronary artery without angina pectoris: Secondary | ICD-10-CM | POA: Diagnosis not present

## 2017-12-04 DIAGNOSIS — J438 Other emphysema: Secondary | ICD-10-CM | POA: Diagnosis not present

## 2017-12-04 DIAGNOSIS — I700 Atherosclerosis of aorta: Secondary | ICD-10-CM | POA: Diagnosis not present

## 2017-12-04 DIAGNOSIS — K219 Gastro-esophageal reflux disease without esophagitis: Secondary | ICD-10-CM | POA: Diagnosis not present

## 2017-12-04 DIAGNOSIS — I10 Essential (primary) hypertension: Secondary | ICD-10-CM | POA: Diagnosis not present

## 2017-12-04 DIAGNOSIS — E785 Hyperlipidemia, unspecified: Secondary | ICD-10-CM | POA: Diagnosis not present

## 2017-12-04 NOTE — Progress Notes (Signed)
Daily Session Note  Patient Details  Name: Karen West MRN: [id number] Date of Birth: 1941-02-17 Referring Provider:     Pulmonary Rehab Walk Test from 10/04/2017 in [place]  Referring Provider  Dr. Chase Caller      Encounter Date: 12/04/2017  Check In: Session Check In - 12/04/17 1233      Check-In   Supervising physician immediately available to respond to emergencies  [facility] immediately available    Physician(s)  Dr. Sloan Leiter    Location  [facility]    Staff Present  Maurice Small, RN, BSN;Mallisa [place], MS, ACSM [name], Exercise Physiologist;Lisa [name], [name], RN, MHA    Medication changes reported      No    Fall or balance concerns reported     No    Tobacco Cessation  No Change    Warm-up and Cool-down  Performed as group-led instruction    Resistance Training Performed  Yes    VAD Patient?  No    PAD/SET Patient?  No      Pain Assessment   Currently in Pain?  No/denies    Multiple Pain Sites  No       Capillary Blood Glucose: No results found for this or any previous visit (from the past 24 hour(s)). POCT Glucose - 12/04/17 1300      POCT Blood Glucose   Pre-Exercise  110 mg/dL    Post-Exercise  225 mg/dL      Exercise Prescription Changes - 12/04/17 1300      Response to Exercise   Blood Pressure (Admit)  140/60    Blood Pressure (Exercise)  150/60    Blood Pressure (Exit)  104/50    Heart Rate (Admit)  79 bpm    Heart Rate (Exercise)  121 bpm    Heart Rate (Exit)  86 bpm    Oxygen Saturation (Admit)  98 %    Oxygen Saturation (Exercise)  95 %    Oxygen Saturation (Exit)  97 %    Rating of Perceived Exertion (Exercise)  13    Perceived Dyspnea (Exercise)  2    Duration  Progress to 45 minutes of aerobic exercise without signs/symptoms of physical distress    Intensity  THRR unchanged      Progression   Progression  Continue to progress workloads to maintain  intensity without signs/symptoms of physical distress.      Resistance Training   Training Prescription  Yes    Weight  orange bands    Reps  10-15    Time  10 Minutes      Interval Training   Interval Training  No      NuStep   Level  4    SPM  80    Minutes  17    METs  3.1      Track   Laps  22    Minutes  17       Social History   Tobacco Use  Smoking Status Never Smoker  Smokeless Tobacco Never Used    Goals Met:  Exercise tolerated well  Goals Unmet:  Not Applicable  Comments: Service time is from 10:30a to 12:10p    Dr. Rush Farmer is Medical Director for Pulmonary Rehab at [facility].

## 2017-12-05 NOTE — Progress Notes (Addendum)
Pulmonary Individual Treatment Plan  Patient Details  Name: MILAH RECHT MRN: [id number] Date of Birth: [birth year] Referring Provider:     Pulmonary Rehab Walk Test from 10/04/2017 in [place]  Referring Provider  Dr. Chase Caller      Initial Encounter Date:    Pulmonary Rehab Walk Test from 10/04/2017 in [place]  Date  10/04/17      Visit Diagnosis: Pulmonary emphysema, unspecified emphysema type ([place])  Patient's Home Medications on Admission:  Current Outpatient Medications:  .  acetaminophen (TYLENOL) 500 MG tablet, Take 1,000 mg by mouth every 6 (six) hours as needed for moderate pain or headache., Disp: , Rfl:  .  amLODipine (NORVASC) 10 MG tablet, Take 10 mg by mouth daily., Disp: , Rfl:  .  aspirin 81 MG tablet, Take 81 mg by mouth daily.  , Disp: , Rfl:  .  ezetimibe (ZETIA) 10 MG tablet, Take 5 mg by mouth daily. , Disp: , Rfl:  .  ferrous sulfate 325 (65 FE) MG tablet, TAKE 1 TABLET (325 MG TOTAL) BY MOUTH 3 (THREE) TIMES DAILY WITH MEALS., Disp: 270 tablet, Rfl: 0 .  glipiZIDE (GLUCOTROL) 10 MG 24 hr tablet, Take 10 mg by mouth 2 (two) times daily.  , Disp: , Rfl:  .  hydrocortisone cream 1 %, Apply 1 application topically 2 (two) times daily., Disp: , Rfl:  .  levothyroxine (SYNTHROID, LEVOTHROID) 88 MCG tablet, Take 88 mcg by mouth daily., Disp: , Rfl: 5 .  Magnesium Oxide 200 MG TABS, Take 1 tablet by mouth daily., Disp: , Rfl:  .  metFORMIN (GLUCOPHAGE) 1000 MG tablet, Take 1,000 mg by mouth 2 (two) times daily. , Disp: , Rfl:  .  Omega-3 Fatty Acids (FISH OIL) 1200 MG CAPS, Take 1,200 mg by mouth 2 (two) times daily. , Disp: , Rfl:  .  omeprazole (PRILOSEC) 20 MG capsule, Take 20 mg by mouth 2 (two) times daily. Takes prn, Disp: , Rfl:  .  rosuvastatin (CRESTOR) 20 MG tablet, Take 20 mg by mouth daily., Disp: , Rfl:   Past Medical History: Past Medical History:  Diagnosis Date  . Atherosclerosis  of aorta ([place])    a. 01/2017/03/2017 - noted on high res chest CTs.  . Breast cancer ([place])    a. Bilateral --> s/p left mastectomy  . Carotid artery disease ([place])    a. 03/8297 w/ 37-16% LICA stenosis and <96% RICA stenosis; b. 10/2015 Carotid U/S: < 50% BICA stenosis  . Chest pain    a. 09/2011 MV: EF 68%, no ischemia/infarct.  . Chronic anemia   . Chronic headaches    denies  . Coronary artery calcification seen on CT scan    a. 01/2017 High res CT: atherosclerotic calcification of the arterial vascularture, including severe involvement of the coronary arteries; b. 03/2017 CT Chest: coronary and Ao atheroscelrosis.  Marland Kitchen GERD (gastroesophageal reflux disease)   . History of echocardiogram    a. 09/2011 Echo: EF 55-60%, no rwma, triv AI, PASP 61mHg.  .Marland KitchenHyperlipidemia   . Hypertension   . Hyperthyroidism   . Left upper lobe pulmonary nodule    a. 02/2017 PET: slowing enlarging 1.7cm LUL nodule w/ low-grade metabolic activity; b. 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 05/2017 s/p VATS.  . Multinodular goiter    a. 02/2017 PET scan- Hypermetabolic nodule;  b. 21/0175s/p thyroidectomy  . Obesity   . OSA on CPAP    cpap  .  Osteoarthritis   . Personal history of radiation therapy 1999  . Right bundle branch block   . Type II or unspecified type diabetes mellitus without mention of complication, uncontrolled     Tobacco Use: Social History   Tobacco Use  Smoking Status Never Smoker  Smokeless Tobacco Never Used    Labs: Recent Review Flowsheet Data    Labs for ITP Cardiac and Pulmonary Rehab Latest Ref Rng & Units 10/04/2011 10/05/2011 05/08/2017 05/31/2017 06/05/2017   Cholestrol 0 - 200 mg/dL - 141 - - -   LDLCALC 0 - 99 mg/dL - 65 - - -   HDL >39 mg/dL - 49 - - -   Trlycerides <150 mg/dL - 137 - - -   Hemoglobin A1c 4.8 - 5.6 % 6.1(H) - 6.9(H) - -   PHART 7.350 - 7.450 - - - 7.465(H) 7.460(H)   PCO2ART 32.0 - 48.0 mmHg - - - 36.8 37.2   HCO3 20.0 - 28.0 mmol/L - - - 26.1 26.2    O2SAT % - - - 98.5 95.1      Capillary Blood Glucose: Lab Results  Component Value Date   GLUCAP 244 (H) 06/15/2017   GLUCAP 90 06/15/2017   GLUCAP 156 (H) 06/14/2017   GLUCAP 119 (H) 06/14/2017   GLUCAP 194 (H) 06/14/2017   POCT Glucose    Row Name 10/23/17 1242 11/06/17 1259 11/20/17 1224 12/04/17 1300       POCT Blood Glucose   Pre-Exercise  119 mg/dL  130 mg/dL  127 mg/dL  110 mg/dL    Post-Exercise  211 mg/dL  192 mg/dL  165 mg/dL  225 mg/dL       Exercise Target Goals: Exercise Program Goal: Individual exercise prescription set using results from initial 6 min walk test and THRR while considering  patient's activity barriers and safety.   Exercise Prescription Goal: Initial exercise prescription builds to 30-45 minutes a day of aerobic activity, 2-3 days per week.  Home exercise guidelines will be given to patient during program as part of exercise prescription that the participant will acknowledge.  Activity Barriers & Risk Stratification: Activity Barriers & Cardiac Risk Stratification - 09/26/17 1452      Activity Barriers & Cardiac Risk Stratification   Activity Barriers  Arthritis;Shortness of Breath;Incisional Pain;Deconditioning   rt foot      6 Minute Walk: 6 Minute Walk    Row Name 10/05/17 0845         6 Minute Walk   Phase  Initial     Distance  720 feet     Walk Time  6 minutes     # of Rest Breaks  0     MPH  1.36     METS  2.07     RPE  11     Perceived Dyspnea   2     Symptoms  Yes (comment)     Comments  used wheelchair due to pain in foot 2/10 pain after ambulating     Resting HR  94 bpm     Resting BP  144/60     Resting Oxygen Saturation   96 %     Exercise Oxygen Saturation  during 6 min walk  96 %     Max Ex. HR  130 bpm     Max Ex. BP  144/64       Interval HR   1 Minute HR  113     2 Minute HR  123  3 Minute HR  123     4 Minute HR  130     5 Minute HR  129     6 Minute HR  123     2 Minute Post HR  109      Interval Heart Rate?  Yes       Interval Oxygen   Interval Oxygen?  Yes     Baseline Oxygen Saturation %  96 %     1 Minute Oxygen Saturation %  97 %     1 Minute Liters of Oxygen  0 L     2 Minute Oxygen Saturation %  96 %     2 Minute Liters of Oxygen  0 L     3 Minute Oxygen Saturation %  96 %     3 Minute Liters of Oxygen  0 L     4 Minute Oxygen Saturation %  96 %     4 Minute Liters of Oxygen  0 L     5 Minute Oxygen Saturation %  96 %     5 Minute Liters of Oxygen  0 L     6 Minute Oxygen Saturation %  96 %     6 Minute Liters of Oxygen  0 L     2 Minute Post Oxygen Saturation %  95 %     2 Minute Post Liters of Oxygen  0 L        Oxygen Initial Assessment: Oxygen Initial Assessment - 10/05/17 0844      Initial 6 min Walk   Oxygen Used  None      Program Oxygen Prescription   Program Oxygen Prescription  None       Oxygen Re-Evaluation: Oxygen Re-Evaluation    Row Name 11/05/17 0924 12/03/17 1636           Program Oxygen Prescription   Program Oxygen Prescription  None  None        Home Oxygen   Home Oxygen Device  None  None      Sleep Oxygen Prescription  None  None      Home Exercise Oxygen Prescription  None  None      Home at Rest Exercise Oxygen Prescription  None  None         Oxygen Discharge (Final Oxygen Re-Evaluation): Oxygen Re-Evaluation - 12/03/17 1636      Program Oxygen Prescription   Program Oxygen Prescription  None      Home Oxygen   Home Oxygen Device  None    Sleep Oxygen Prescription  None    Home Exercise Oxygen Prescription  None    Home at Rest Exercise Oxygen Prescription  None       Initial Exercise Prescription: Initial Exercise Prescription - 10/05/17 0800      Date of Initial Exercise RX and Referring Provider   Date  10/04/17    Referring Provider  Dr. Chase Caller      NuStep   Level  2    SPM  80    Minutes  17    METs  1.5      Arm Ergometer   Level  1    Watts  10    Minutes  17      Track    Laps  8    Minutes  17      Prescription Details   Frequency (times per week)  2    Duration  Progress  to 45 minutes of aerobic exercise without signs/symptoms of physical distress      Intensity   THRR 40-80% of Max Heartrate  58-116    Ratings of Perceived Exertion  11-13    Perceived Dyspnea  0-4      Progression   Progression  Continue progressive overload as per policy without signs/symptoms or physical distress.      Resistance Training   Training Prescription  Yes    Weight  orange bands    Reps  10-15       Perform Capillary Blood Glucose checks as needed.  Exercise Prescription Changes: Exercise Prescription Changes    Row Name 10/23/17 1200 11/06/17 1200 11/15/17 0600 11/20/17 1200 12/04/17 1300     Response to Exercise   Blood Pressure (Admit)  120/60  132/60  -  122/68  140/60   Blood Pressure (Exercise)  154/78  140/64  -  142/60  150/60   Blood Pressure (Exit)  122/62  122/58  -  118/60  104/50   Heart Rate (Admit)  78 bpm  82 bpm  -  80 bpm  79 bpm   Heart Rate (Exercise)  99 bpm  103 bpm  -  113 bpm  121 bpm   Heart Rate (Exit)  78 bpm  75 bpm  -  73 bpm  86 bpm   Oxygen Saturation (Admit)  98 %  99 %  -  99 %  98 %   Oxygen Saturation (Exercise)  96 %  99 %  -  95 %  95 %   Oxygen Saturation (Exit)  96 %  98 %  -  99 %  97 %   Rating of Perceived Exertion (Exercise)  13  13  -  13  13   Perceived Dyspnea (Exercise)  1  2  -  2  2   Duration  Progress to 45 minutes of aerobic exercise without signs/symptoms of physical distress  Progress to 45 minutes of aerobic exercise without signs/symptoms of physical distress  -  Progress to 45 minutes of aerobic exercise without signs/symptoms of physical distress  Progress to 45 minutes of aerobic exercise without signs/symptoms of physical distress   Intensity  THRR unchanged  THRR unchanged  -  THRR unchanged  THRR unchanged     Progression   Progression  Continue to progress workloads to maintain intensity without  signs/symptoms of physical distress.  Continue to progress workloads to maintain intensity without signs/symptoms of physical distress.  -  Continue to progress workloads to maintain intensity without signs/symptoms of physical distress.  Continue to progress workloads to maintain intensity without signs/symptoms of physical distress.     Resistance Training   Training Prescription  Yes  Yes  -  Yes  Yes   Weight  orange bands  orange bands  -  orange bands  orange bands   Reps  10-15  10-15  -  10-15  10-15   Time  -  -  -  10 Minutes  10 Minutes     Interval Training   Interval Training  -  -  -  No  No     NuStep   Level  3  3  -  2  4   SPM  80  80  -  80  80   Minutes  17  17  -  17  17   METs  2.8  2.7  -  1.9  3.1     Arm Ergometer   Level  1  3  -  -  -   Watts  10  10  -  -  -   Minutes  20  17  -  -  -     Track   Laps  -  8  -  10  22   Minutes  -  17  -  17  17     Home Exercise Plan   Plans to continue exercise at  -  -  Home (comment)  -  -   Frequency  -  -  Add 2 additional days to program exercise sessions.  -  -      Exercise Comments: Exercise Comments    Row Name 11/15/17 [phone]           Exercise Comments  Home exercise completed          Exercise Goals and Review: Exercise Goals    Row Name 09/26/17 1453             Exercise Goals   Increase Physical Activity  Yes       Intervention  Provide advice, education, support and counseling about physical activity/exercise needs.;Develop an individualized exercise prescription for aerobic and resistive training based on initial evaluation findings, risk stratification, comorbidities and participant's personal goals.       Expected Outcomes  Short Term: Attend rehab on a regular basis to increase amount of physical activity.;Long Term: Add in home exercise to make exercise part of routine and to increase amount of physical activity.;Long Term: Exercising regularly at least 3-5 days a week.        Increase Strength and Stamina  Yes       Intervention  Develop an individualized exercise prescription for aerobic and resistive training based on initial evaluation findings, risk stratification, comorbidities and participant's personal goals.;Provide advice, education, support and counseling about physical activity/exercise needs.       Expected Outcomes  Short Term: Increase workloads from initial exercise prescription for resistance, speed, and METs.;Short Term: Perform resistance training exercises routinely during rehab and add in resistance training at home;Long Term: Improve cardiorespiratory fitness, muscular endurance and strength as measured by increased METs and functional capacity (6MWT)       Able to understand and use rate of perceived exertion (RPE) scale  Yes       Intervention  Provide education and explanation on how to use RPE scale       Expected Outcomes  Short Term: Able to use RPE daily in rehab to express subjective intensity level;Long Term:  Able to use RPE to guide intensity level when exercising independently       Able to understand and use Dyspnea scale  Yes       Intervention  Provide education and explanation on how to use Dyspnea scale       Expected Outcomes  Short Term: Able to use Dyspnea scale daily in rehab to express subjective sense of shortness of breath during exertion;Long Term: Able to use Dyspnea scale to guide intensity level when exercising independently       Knowledge and understanding of Target Heart Rate Range (THRR)  Yes       Intervention  Provide education and explanation of THRR including how the numbers were predicted and where they are located for reference       Expected Outcomes  Short Term: Able to state/look up THRR;Long  Term: Able to use THRR to govern intensity when exercising independently;Short Term: Able to use daily as guideline for intensity in rehab       Understanding of Exercise Prescription  Yes       Intervention  Provide  education, explanation, and written materials on patient's individual exercise prescription       Expected Outcomes  Short Term: Able to explain program exercise prescription;Long Term: Able to explain home exercise prescription to exercise independently          Exercise Goals Re-Evaluation : Exercise Goals Re-Evaluation    Row Name 11/05/17 0925 12/03/17 1636           Exercise Goal Re-Evaluation   Exercise Goals Review  Increase Physical Activity;Increase Strength and Stamina;Able to understand and use rate of perceived exertion (RPE) scale;Able to understand and use Dyspnea scale;Knowledge and understanding of Target Heart Rate Range (THRR);Understanding of Exercise Prescription  Increase Physical Activity;Increase Strength and Stamina;Able to understand and use rate of perceived exertion (RPE) scale;Able to understand and use Dyspnea scale;Knowledge and understanding of Target Heart Rate Range (THRR);Understanding of Exercise Prescription      Comments  Patient has only attended 4 rehab sessions. Will cont. to monitor and progress as able.  Patient is progressing well in program. Tiajuana Amass is her foot pain-- per Dr. I will have to take her off the walking track. MET places her in a moderate level. Will cont to progress and motivate. Will tailor her exercise prescription to her foot pain.       Expected Outcomes  Through exercise at rehab and at home, the patient will decrease shortness of breath with daily activities and feel confident in carrying out an exercise regime at home.   Through exercise at rehab and at home, the patient will decrease shortness of breath with daily activities and feel confident in carrying out an exercise regime at home.          Discharge Exercise Prescription (Final Exercise Prescription Changes): Exercise Prescription Changes - 12/04/17 1300      Response to Exercise   Blood Pressure (Admit)  140/60    Blood Pressure (Exercise)  150/60    Blood Pressure  (Exit)  104/50    Heart Rate (Admit)  79 bpm    Heart Rate (Exercise)  121 bpm    Heart Rate (Exit)  86 bpm    Oxygen Saturation (Admit)  98 %    Oxygen Saturation (Exercise)  95 %    Oxygen Saturation (Exit)  97 %    Rating of Perceived Exertion (Exercise)  13    Perceived Dyspnea (Exercise)  2    Duration  Progress to 45 minutes of aerobic exercise without signs/symptoms of physical distress    Intensity  THRR unchanged      Progression   Progression  Continue to progress workloads to maintain intensity without signs/symptoms of physical distress.      Resistance Training   Training Prescription  Yes    Weight  orange bands    Reps  10-15    Time  10 Minutes      Interval Training   Interval Training  No      NuStep   Level  4    SPM  80    Minutes  17    METs  3.1      Track   Laps  22    Minutes  17       Nutrition:  Target  Goals: Understanding of nutrition guidelines, daily intake of sodium '1500mg'$ , cholesterol '200mg'$ , calories 30% from fat and 7% or less from saturated fats, daily to have 5 or more servings of fruits and vegetables.  Biometrics:    Nutrition Therapy Plan and Nutrition Goals:   Nutrition Assessments:   Nutrition Goals Re-Evaluation:   Nutrition Goals Re-Evaluation:   Nutrition Goals Discharge (Final Nutrition Goals Re-Evaluation):   Psychosocial: Target Goals: Acknowledge presence or absence of significant depression and/or stress, maximize coping skills, provide positive support system. Participant is able to verbalize types and ability to use techniques and skills needed for reducing stress and depression.  Initial Review & Psychosocial Screening: Initial Psych Review & Screening - 11/06/17 0955      Initial Review   Current issues with  None Identified      Family Dynamics   Good Support System?  Yes    Comments  faith, children, sisters      Barriers   Psychosocial barriers to participate in program  There are no  identifiable barriers or psychosocial needs.      Screening Interventions   Interventions  Encouraged to exercise       Quality of Life Scores:  Scores of 19 and below usually indicate a poorer quality of life in these areas.  A difference of  2-3 points is a clinically meaningful difference.  A difference of 2-3 points in the total score of the Quality of Life Index has been associated with significant improvement in overall quality of life, self-image, physical symptoms, and general health in studies assessing change in quality of life.  PHQ-9: Recent Review Flowsheet Data    Depression screen [facility] 2/9 10/11/2017 09/26/2017   Decreased Interest 0 0   Down, Depressed, Hopeless 0 0   PHQ - 2 Score 0 0   Altered sleeping 0 0   Tired, decreased energy 1 1   Change in appetite 2 3   Feeling bad or failure about yourself  0 0   Trouble concentrating 0 0   Moving slowly or fidgety/restless 1 0   Suicidal thoughts 0 0   PHQ-9 Score 4 4   Difficult doing work/chores Not difficult at all Not difficult at all     Interpretation of Total Score  Total Score Depression Severity:  1-4 = Minimal depression, 5-9 = Mild depression, 10-14 = Moderate depression, 15-19 = Moderately severe depression, 20-27 = Severe depression   Psychosocial Evaluation and Intervention: Psychosocial Evaluation - 12/05/17 1412      Psychosocial Evaluation & Interventions   Interventions  Encouraged to exercise with the program and follow exercise prescription    Comments  no identifiable barriers to particpating.  Pt interacts well with staff and fellow particpants. Pt seen holding hands with another pt as they walked around the track.    Expected Outcomes  Pt will continue to display positive outlook on life with healthy coping skills    Continue Psychosocial Services   Follow up required by staff       Psychosocial Re-Evaluation: Psychosocial Re-Evaluation    [place] Name 11/06/17 1000 12/05/17 1413            Psychosocial Re-Evaluation   Current issues with  None Identified  None Identified      Comments  no identifiable barriers to particpate   no identifiable barriers to particpate       Expected Outcomes  Continue to display positve coping skills and healthy outlook on life with the appropriate  level of support from her family  Continue to display positve coping skills and healthy outlook on life with the appropriate level of support from her family      Interventions  Relaxation education;Stress management education;Encouraged to attend Pulmonary Rehabilitation for the exercise  Relaxation education;Stress management education;Encouraged to attend Pulmonary Rehabilitation for the exercise         Psychosocial Discharge (Final Psychosocial Re-Evaluation): Psychosocial Re-Evaluation - 12/05/17 1413      Psychosocial Re-Evaluation   Current issues with  None Identified    Comments  no identifiable barriers to particpate     Expected Outcomes  Continue to display positve coping skills and healthy outlook on life with the appropriate level of support from her family    Interventions  Relaxation education;Stress management education;Encouraged to attend Pulmonary Rehabilitation for the exercise       Vocational Rehabilitation: Provide vocational rehab assistance to qualifying candidates.   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 Participant will state understanding/return demonstration of topics presented.  Learning Barriers/Preferences: Learning Barriers/Preferences - 09/26/17 1451      Learning Barriers/Preferences   Learning Barriers  Sight    Learning Preferences  Computer/Internet;Group  Instruction;Individual Instruction;Verbal Instruction;Video;Written Material       Education Topics: Count Your Pulse:  -Group instruction provided by verbal instruction, demonstration, patient participation and written materials to support subject.  Instructors address importance of being able to find your pulse and how to count your pulse when at home without a heart monitor.  Patients get hands on experience counting their pulse with staff help and individually.   Heart Attack, Angina, and Risk Factor Modification:  -Group instruction provided by verbal instruction, video, and written materials to support subject.  Instructors address signs and symptoms of angina and heart attacks.    Also discuss risk factors for heart disease and how to make changes to improve heart health risk factors.   Functional Fitness:  -Group instruction provided by verbal instruction, demonstration, patient participation, and written materials to support subject.  Instructors address safety measures for doing things around the house.  Discuss how to get up and down off the floor, how to pick things up properly, how to safely get out of a chair without assistance, and balance training.   Meditation and Mindfulness:  -Group instruction provided by verbal instruction, patient participation, and written materials to support subject.  Instructor addresses importance of mindfulness and meditation practice to help reduce stress and improve awareness.  Instructor also leads participants through a meditation exercise.    PULMONARY REHAB OTHER RESPIRATORY from 11/27/2017 in [place]  Date  11/01/17  Educator  [facility]  Instruction Review Code  2- Demonstrated Understanding      Stretching for Flexibility and Mobility:  -Group instruction provided by verbal instruction, patient participation, and written materials to support subject.  Instructors lead participants through series of stretches  that are designed to increase flexibility thus improving mobility.  These stretches are additional exercise for major muscle groups that are typically performed during regular warm up and cool down.   Hands Only CPR:  -Group verbal, video, and participation provides a basic overview of AHA guidelines for community CPR. Role-play  of emergencies allow participants the opportunity to practice calling for help and chest compression technique with discussion of AED use.   Hypertension: -Group verbal and written instruction that provides a basic overview of hypertension including the most recent diagnostic guidelines, risk factor reduction with self-care instructions and medication management.    Nutrition I class: Heart Healthy Eating:  -Group instruction provided by PowerPoint slides, verbal discussion, and written materials to support subject matter. The instructor gives an explanation and review of the Therapeutic Lifestyle Changes diet recommendations, which includes a discussion on lipid goals, dietary fat, sodium, fiber, plant stanol/sterol esters, sugar, and the components of a well-balanced, healthy diet.   Nutrition II class: Lifestyle Skills:  -Group instruction provided by PowerPoint slides, verbal discussion, and written materials to support subject matter. The instructor gives an explanation and review of label reading, grocery shopping for heart health, heart healthy recipe modifications, and ways to make healthier choices when eating out.   Diabetes Question & Answer:  -Group instruction provided by PowerPoint slides, verbal discussion, and written materials to support subject matter. The instructor gives an explanation and review of diabetes co-morbidities, pre- and post-prandial blood glucose goals, pre-exercise blood glucose goals, signs, symptoms, and treatment of hypoglycemia and hyperglycemia, and foot care basics.   Diabetes Blitz:  -Group instruction provided by PowerPoint  slides, verbal discussion, and written materials to support subject matter. The instructor gives an explanation and review of the physiology behind type 1 and type 2 diabetes, diabetes medications and rational behind using different medications, pre- and post-prandial blood glucose recommendations and Hemoglobin A1c goals, diabetes diet, and exercise including blood glucose guidelines for exercising safely.    Portion Distortion:  -Group instruction provided by PowerPoint slides, verbal discussion, written materials, and food models to support subject matter. The instructor gives an explanation of serving size versus portion size, changes in portions sizes over the last 20 years, and what consists of a serving from each food group.   Stress Management:  -Group instruction provided by verbal instruction, video, and written materials to support subject matter.  Instructors review role of stress in heart disease and how to cope with stress positively.     Exercising on Your Own:  -Group instruction provided by verbal instruction, power point, and written materials to support subject.  Instructors discuss benefits of exercise, components of exercise, frequency and intensity of exercise, and end points for exercise.  Also discuss use of nitroglycerin and activating EMS.  Review options of places to exercise outside of rehab.  Review guidelines for sex with heart disease.   Cardiac Drugs I:  -Group instruction provided by verbal instruction and written materials to support subject.  Instructor reviews cardiac drug classes: antiplatelets, anticoagulants, beta blockers, and statins.  Instructor discusses reasons, side effects, and lifestyle considerations for each drug class.   Cardiac Drugs II:  -Group instruction provided by verbal instruction and written materials to support subject.  Instructor reviews cardiac drug classes: angiotensin converting enzyme inhibitors (ACE-I), angiotensin II receptor  blockers (ARBs), nitrates, and calcium channel blockers.  Instructor discusses reasons, side effects, and lifestyle considerations for each drug class.   Anatomy and Physiology of the Circulatory System:  Group verbal and written instruction and models provide basic cardiac anatomy and physiology, with the coronary electrical and arterial systems. Review of: AMI, Angina, Valve disease, Heart Failure, Peripheral Artery Disease, Cardiac Arrhythmia, Pacemakers, and the ICD.   Other Education:  -Group or individual verbal, written, or video instructions that support the  educational goals of the cardiac rehab program.   Holiday Eating Survival Tips:  -Group instruction provided by PowerPoint slides, verbal discussion, and written materials to support subject matter. The instructor gives patients tips, tricks, and techniques to help them not only survive but enjoy the holidays despite the onslaught of food that accompanies the holidays.   Knowledge Questionnaire Score: Knowledge Questionnaire Score - 09/27/17 1459      Knowledge Questionnaire Score   Pre Score  --   pre score      Core Components/Risk Factors/Patient Goals at Admission: Personal Goals and Risk Factors at Admission - 09/26/17 1436      Core Components/Risk Factors/Patient Goals on Admission    Weight Management  Weight Maintenance;Yes    Intervention  Weight Management: Provide education and appropriate resources to help participant work on and attain dietary goals.;Weight Management: Develop a combined nutrition and exercise program designed to reach desired caloric intake, while maintaining appropriate intake of nutrient and fiber, sodium and fats, and appropriate energy expenditure required for the weight goal.    Expected Outcomes  Short Term: Continue to assess and modify interventions until short term weight is achieved;Weight Maintenance: Understanding of the daily nutrition guidelines, which includes 25-35% calories  from fat, 7% or less cal from saturated fats, less than '200mg'$  cholesterol, less than 1.5gm of sodium, & 5 or more servings of fruits and vegetables daily;Understanding recommendations for meals to include 15-35% energy as protein, 25-35% energy from fat, 35-60% energy from carbohydrates, less than '200mg'$  of dietary cholesterol, 20-35 gm of total fiber daily;Understanding of distribution of calorie intake throughout the day with the consumption of 4-5 meals/snacks    Improve shortness of breath with ADL's  Yes    Intervention  Provide education, individualized exercise plan and daily activity instruction to help decrease symptoms of SOB with activities of daily living.    Expected Outcomes  Short Term: Improve cardiorespiratory fitness to achieve a reduction of symptoms when performing ADLs;Long Term: Be able to perform more ADLs without symptoms or delay the onset of symptoms    Diabetes  Yes    Intervention  Provide education about signs/symptoms and action to take for hypo/hyperglycemia.;Provide education about proper nutrition, including hydration, and aerobic/resistive exercise prescription along with prescribed medications to achieve blood glucose in normal ranges: Fasting glucose 65-99 mg/dL    Expected Outcomes  Short Term: Participant verbalizes understanding of the signs/symptoms and immediate care of hyper/hypoglycemia, proper foot care and importance of medication, aerobic/resistive exercise and nutrition plan for blood glucose control.;Long Term: Attainment of HbA1C < 7%.       Core Components/Risk Factors/Patient Goals Review:  Goals and Risk Factor Review    Row Name 11/06/17 1002 11/07/17 0930 12/05/17 1413         Core Components/Risk Factors/Patient Goals Review   Personal Goals Review  Weight Management/Obesity;Improve shortness of breath with ADL's;Increase knowledge of respiratory medications and ability to use respiratory devices properly.;Develop more efficient breathing  techniques such as purse lipped breathing and diaphragmatic breathing and practicing self-pacing with activity.;Diabetes  -  Weight Management/Obesity;Improve shortness of breath with ADL's;Increase knowledge of respiratory medications and ability to use respiratory devices properly.;Develop more efficient breathing techniques such as purse lipped breathing and diaphragmatic breathing and practicing self-pacing with activity.;Diabetes     Review  Pt has completed 4 exercise sessions since starting on 7/25 and 1 incomplete due to elevation of CBG 357. Pt has a rheumatolgy consult to determine the cause of her discomfort to  her great toe.  Unable to adequately assess pt progress toward pulmnary goals.  I anticipate pt will show pogress toward goals and improved managment of her diabetes  -  Pt has completed 13 exercise sessions.  Pt progress has been hampered with pt issues with the top of her foot.  Pt has slow healing area and is taking prednisone.  Pt given guidelines to observe which involves resting her foot more. Pt observed using PLB techniques particularly while ambulating on the track/stairs  Pt reates her shortness of breath a level 2 for dypnea. Pt weight is elevated to 1.2 kg due in part by her inability to be as active at home because of the restrictions for her foot.  Pt with reportable readings for CBG well within range of acceptablitity. Pt workloads show average of 18-24 laps around the track during two stations and using the stairs, nustep level 4 .  Continue to monitor pt progress during the next 30 day assessment.     Expected Outcomes  See admission goals  See Admission Goals/Outcomes  See Admission Goals/Outcomes        Core Components/Risk Factors/Patient Goals at Discharge (Final Review):  Goals and Risk Factor Review - 12/05/17 1413      Core Components/Risk Factors/Patient Goals Review   Personal Goals Review  Weight Management/Obesity;Improve shortness of breath with ADL's;Increase  knowledge of respiratory medications and ability to use respiratory devices properly.;Develop more efficient breathing techniques such as purse lipped breathing and diaphragmatic breathing and practicing self-pacing with activity.;Diabetes    Review  Pt has completed 13 exercise sessions.  Pt progress has been hampered with pt issues with the top of her foot.  Pt has slow healing area and is taking prednisone.  Pt given guidelines to observe which involves resting her foot more. Pt observed using PLB techniques particularly while ambulating on the track/stairs  Pt reates her shortness of breath a level 2 for dypnea. Pt weight is elevated to 1.2 kg due in part by her inability to be as active at home because of the restrictions for her foot.  Pt with reportable readings for CBG well within range of acceptablitity. Pt workloads show average of 18-24 laps around the track during two stations and using the stairs, nustep level 4 .  Continue to monitor pt progress during the next 30 day assessment.    Expected Outcomes  See Admission Goals/Outcomes       ITP Comments: ITP Comments    Row Name 09/26/17 1423 11/07/17 0930 12/05/17 1411       ITP Comments  Dr. Jennet Maduro, Medical Director  Dr. Jennet Maduro, Medical Director  Dr. Jennet Maduro, Medical Director Pulmonary Rehab        Comments: Pt has completed 13 exercise sessions. Cherre Huger, BSN Cardiac and Training and development officer

## 2017-12-06 ENCOUNTER — Encounter (HOSPITAL_COMMUNITY)
Admission: RE | Admit: 2017-12-06 | Discharge: 2017-12-06 | Disposition: A | Discharge status: Home / Self Care | Payer: Medicare Other | Source: Ambulatory Visit | Attending: Internal Medicine | Admitting: Internal Medicine

## 2017-12-06 DIAGNOSIS — K219 Gastro-esophageal reflux disease without esophagitis: Secondary | ICD-10-CM | POA: Diagnosis not present

## 2017-12-06 DIAGNOSIS — J439 Emphysema, unspecified: Secondary | ICD-10-CM

## 2017-12-06 DIAGNOSIS — E785 Hyperlipidemia, unspecified: Secondary | ICD-10-CM | POA: Diagnosis not present

## 2017-12-06 DIAGNOSIS — I700 Atherosclerosis of aorta: Secondary | ICD-10-CM | POA: Diagnosis not present

## 2017-12-06 DIAGNOSIS — J438 Other emphysema: Secondary | ICD-10-CM | POA: Diagnosis not present

## 2017-12-06 DIAGNOSIS — I2510 Atherosclerotic heart disease of native coronary artery without angina pectoris: Secondary | ICD-10-CM | POA: Diagnosis not present

## 2017-12-06 DIAGNOSIS — I10 Essential (primary) hypertension: Secondary | ICD-10-CM | POA: Diagnosis not present

## 2017-12-06 NOTE — Progress Notes (Signed)
Daily Session Note  Patient Details  Name: Karen West MRN: [id number] Date of Birth: 1940-09-29 Referring Provider:     Pulmonary Rehab Walk Test from 10/04/2017 in [place]  Referring Provider  Dr. Chase Caller      Encounter Date: 12/06/2017  Check In: Session Check In - 12/06/17 1230      Check-In   Supervising physician immediately available to respond to emergencies  [facility] immediately available    Physician(s)  Dr. Toma Copier    Location  [facility]    Staff Present  Maurice Small, RN, BSN;[name], MS, ACSM [name], Exercise Physiologist;Tyaire Odem [name], [name], RN, MHA    Medication changes reported      No    Fall or balance concerns reported     No    Tobacco Cessation  No Change    Warm-up and Cool-down  Performed as group-led instruction    Resistance Training Performed  Yes    VAD Patient?  No    PAD/SET Patient?  No      Pain Assessment   Currently in Pain?  No/denies    Multiple Pain Sites  No       Capillary Blood Glucose: No results found for this or any previous visit (from the past 24 hour(s)).    Social History   Tobacco Use  Smoking Status Never Smoker  Smokeless Tobacco Never Used    Goals Met:  Exercise tolerated well No report of cardiac concerns or symptoms Strength training completed today  Goals Unmet:  Not Applicable  Comments: Service time is from 1030 to 1220. Attended the Sedentary  Lifestyle class.    Dr. Rush Farmer is Medical Director for Pulmonary Rehab at [facility]

## 2017-12-11 ENCOUNTER — Encounter (HOSPITAL_COMMUNITY)
Admission: RE | Admit: 2017-12-11 | Discharge: 2017-12-11 | Disposition: A | Discharge status: Home / Self Care | Payer: Medicare Other | Source: Ambulatory Visit | Attending: Internal Medicine | Admitting: Internal Medicine

## 2017-12-11 DIAGNOSIS — I10 Essential (primary) hypertension: Secondary | ICD-10-CM | POA: Diagnosis not present

## 2017-12-11 DIAGNOSIS — K219 Gastro-esophageal reflux disease without esophagitis: Secondary | ICD-10-CM | POA: Diagnosis not present

## 2017-12-11 DIAGNOSIS — I700 Atherosclerosis of aorta: Secondary | ICD-10-CM | POA: Diagnosis not present

## 2017-12-11 DIAGNOSIS — J439 Emphysema, unspecified: Secondary | ICD-10-CM

## 2017-12-11 DIAGNOSIS — J438 Other emphysema: Secondary | ICD-10-CM | POA: Diagnosis not present

## 2017-12-11 DIAGNOSIS — E785 Hyperlipidemia, unspecified: Secondary | ICD-10-CM | POA: Diagnosis not present

## 2017-12-11 DIAGNOSIS — I2510 Atherosclerotic heart disease of native coronary artery without angina pectoris: Secondary | ICD-10-CM | POA: Diagnosis not present

## 2017-12-11 NOTE — Progress Notes (Signed)
Daily Session Note  Patient Details  Name: Karen West MRN: [id number] Date of Birth: 28-Feb-1941 Referring Provider:     Pulmonary Rehab Walk Test from 10/04/2017 in [place]  Referring Provider  Dr. Chase Caller      Encounter Date: 12/11/2017  Check In: Session Check In - 12/11/17 1018      Check-In   Supervising physician immediately available to respond to emergencies  [facility] immediately available    Physician(s)  Dr. Loleta Books    Location  [facility]    Staff Present  Maurice Small, RN, BSN;Asami Lambright, MS, ACSM [name], Exercise Physiologist;Annedrea Stackhouse, RN, [place]    Medication changes reported      No    Fall or balance concerns reported     No    Tobacco Cessation  No Change    Warm-up and Cool-down  Performed as group-led instruction    Resistance Training Performed  Yes    VAD Patient?  No    PAD/SET Patient?  No      Pain Assessment   Currently in Pain?  No/denies    Multiple Pain Sites  No       Capillary Blood Glucose: No results found for this or any previous visit (from the past 24 hour(s)).    Social History   Tobacco Use  Smoking Status Never Smoker  Smokeless Tobacco Never Used    Goals Met:  Exercise tolerated well  Goals Unmet:  Not Applicable  Comments: Service time is from 10:30a to 12:30p    Dr. Rush Farmer is Medical Director for Pulmonary Rehab at [facility].

## 2017-12-13 ENCOUNTER — Encounter (HOSPITAL_COMMUNITY)
Admission: RE | Admit: 2017-12-13 | Discharge: 2017-12-13 | Disposition: A | Discharge status: Home / Self Care | Payer: Medicare Other | Source: Ambulatory Visit | Attending: Internal Medicine | Admitting: Internal Medicine

## 2017-12-13 DIAGNOSIS — I2510 Atherosclerotic heart disease of native coronary artery without angina pectoris: Secondary | ICD-10-CM | POA: Diagnosis not present

## 2017-12-13 DIAGNOSIS — J439 Emphysema, unspecified: Secondary | ICD-10-CM

## 2017-12-13 DIAGNOSIS — I700 Atherosclerosis of aorta: Secondary | ICD-10-CM | POA: Diagnosis not present

## 2017-12-13 DIAGNOSIS — I10 Essential (primary) hypertension: Secondary | ICD-10-CM | POA: Diagnosis not present

## 2017-12-13 DIAGNOSIS — E785 Hyperlipidemia, unspecified: Secondary | ICD-10-CM | POA: Diagnosis not present

## 2017-12-13 DIAGNOSIS — J438 Other emphysema: Secondary | ICD-10-CM | POA: Diagnosis not present

## 2017-12-13 DIAGNOSIS — K219 Gastro-esophageal reflux disease without esophagitis: Secondary | ICD-10-CM | POA: Diagnosis not present

## 2017-12-13 NOTE — Progress Notes (Signed)
Daily Session Note  Patient Details  Name: OMAR ORREGO MRN: [id number] Date of Birth: 05-25-1940 Referring Provider:     Pulmonary Rehab Walk Test from 10/04/2017 in [place]  Referring Provider  Dr. Chase Caller      Encounter Date: 12/13/2017  Check In: Session Check In - 12/13/17 1046      Check-In   Supervising physician immediately available to respond to emergencies  [facility] immediately available    Physician(s)  Dr. Loleta Books    Location  [facility]    Staff Present  Maurice Small, RN, BSN;Gerrit Rafalski, MS, ACSM [name], Exercise Physiologist;Annedrea Stackhouse, RN, [place]    Medication changes reported      No    Fall or balance concerns reported     No    Tobacco Cessation  No Change    Warm-up and Cool-down  Not performed (comment)    Resistance Training Performed  Yes    VAD Patient?  No    PAD/SET Patient?  No      Pain Assessment   Currently in Pain?  No/denies    Multiple Pain Sites  No       Capillary Blood Glucose: No results found for this or any previous visit (from the past 24 hour(s)).    Social History   Tobacco Use  Smoking Status Never Smoker  Smokeless Tobacco Never Used    Goals Met:  Exercise tolerated well  Goals Unmet:  Not Applicable  Comments: Service time is from 10:30a to 12:30p    Dr. Rush Farmer is Medical Director for Pulmonary Rehab at [facility].

## 2017-12-18 ENCOUNTER — Other Ambulatory Visit: Payer: Medicare Other

## 2017-12-18 ENCOUNTER — Encounter (HOSPITAL_COMMUNITY)
Admission: RE | Admit: 2017-12-18 | Discharge: 2017-12-18 | Disposition: A | Discharge status: Home / Self Care | Payer: Medicare Other | Source: Ambulatory Visit | Attending: Internal Medicine | Admitting: Internal Medicine

## 2017-12-18 VITALS — Wt 146.4 lb

## 2017-12-18 DIAGNOSIS — Z7982 Long term (current) use of aspirin: Secondary | ICD-10-CM | POA: Diagnosis not present

## 2017-12-18 DIAGNOSIS — Z79899 Other long term (current) drug therapy: Secondary | ICD-10-CM | POA: Diagnosis not present

## 2017-12-18 DIAGNOSIS — I4510 Unspecified right bundle-branch block: Secondary | ICD-10-CM | POA: Diagnosis not present

## 2017-12-18 DIAGNOSIS — I700 Atherosclerosis of aorta: Secondary | ICD-10-CM | POA: Diagnosis not present

## 2017-12-18 DIAGNOSIS — Z923 Personal history of irradiation: Secondary | ICD-10-CM | POA: Insufficient documentation

## 2017-12-18 DIAGNOSIS — I10 Essential (primary) hypertension: Secondary | ICD-10-CM | POA: Insufficient documentation

## 2017-12-18 DIAGNOSIS — Z79890 Hormone replacement therapy (postmenopausal): Secondary | ICD-10-CM | POA: Diagnosis not present

## 2017-12-18 DIAGNOSIS — Z7984 Long term (current) use of oral hypoglycemic drugs: Secondary | ICD-10-CM | POA: Diagnosis not present

## 2017-12-18 DIAGNOSIS — K219 Gastro-esophageal reflux disease without esophagitis: Secondary | ICD-10-CM | POA: Diagnosis not present

## 2017-12-18 DIAGNOSIS — E785 Hyperlipidemia, unspecified: Secondary | ICD-10-CM | POA: Diagnosis not present

## 2017-12-18 DIAGNOSIS — J438 Other emphysema: Secondary | ICD-10-CM | POA: Insufficient documentation

## 2017-12-18 DIAGNOSIS — I2510 Atherosclerotic heart disease of native coronary artery without angina pectoris: Secondary | ICD-10-CM | POA: Diagnosis not present

## 2017-12-18 DIAGNOSIS — E0590 Thyrotoxicosis, unspecified without thyrotoxic crisis or storm: Secondary | ICD-10-CM | POA: Diagnosis not present

## 2017-12-18 DIAGNOSIS — E042 Nontoxic multinodular goiter: Secondary | ICD-10-CM | POA: Diagnosis not present

## 2017-12-18 DIAGNOSIS — J439 Emphysema, unspecified: Secondary | ICD-10-CM

## 2017-12-18 DIAGNOSIS — G4733 Obstructive sleep apnea (adult) (pediatric): Secondary | ICD-10-CM | POA: Insufficient documentation

## 2017-12-20 ENCOUNTER — Ambulatory Visit
Admission: RE | Admit: 2017-12-20 | Discharge: 2017-12-20 | Disposition: A | Discharge status: Home / Self Care | Payer: Medicare Other | Source: Ambulatory Visit | Attending: Internal Medicine | Admitting: Internal Medicine

## 2017-12-20 ENCOUNTER — Encounter (HOSPITAL_COMMUNITY)
Admission: RE | Admit: 2017-12-20 | Discharge: 2017-12-20 | Disposition: A | Discharge status: Home / Self Care | Payer: Medicare Other | Source: Ambulatory Visit | Attending: Internal Medicine | Admitting: Internal Medicine

## 2017-12-20 DIAGNOSIS — I10 Essential (primary) hypertension: Secondary | ICD-10-CM | POA: Diagnosis not present

## 2017-12-20 DIAGNOSIS — I700 Atherosclerosis of aorta: Secondary | ICD-10-CM | POA: Diagnosis not present

## 2017-12-20 DIAGNOSIS — I2510 Atherosclerotic heart disease of native coronary artery without angina pectoris: Secondary | ICD-10-CM | POA: Diagnosis not present

## 2017-12-20 DIAGNOSIS — I6523 Occlusion and stenosis of bilateral carotid arteries: Secondary | ICD-10-CM | POA: Diagnosis not present

## 2017-12-20 DIAGNOSIS — E785 Hyperlipidemia, unspecified: Secondary | ICD-10-CM | POA: Diagnosis not present

## 2017-12-20 DIAGNOSIS — J438 Other emphysema: Secondary | ICD-10-CM | POA: Diagnosis not present

## 2017-12-20 DIAGNOSIS — J439 Emphysema, unspecified: Secondary | ICD-10-CM

## 2017-12-20 DIAGNOSIS — I6529 Occlusion and stenosis of unspecified carotid artery: Secondary | ICD-10-CM

## 2017-12-20 DIAGNOSIS — K219 Gastro-esophageal reflux disease without esophagitis: Secondary | ICD-10-CM | POA: Diagnosis not present

## 2017-12-20 NOTE — Progress Notes (Signed)
Daily Session Note  Patient Details  Name: Karen West MRN: [id number] Date of Birth: 12/24/1940 Referring Provider:     Pulmonary Rehab Walk Test from 10/04/2017 in [place]  Referring Provider  Dr. Chase Caller      Encounter Date: 12/20/2017  Check In: Session Check In - 12/20/17 1220      Check-In   Supervising physician immediately available to respond to emergencies  [facility] immediately available    Physician(s)  Dr. Horris Latino    Location  [facility]    Staff Present  Maurice Small, RN, BSN;[name], MS, ACSM [name], Exercise Physiologist;Roshad Hack [name], [name], RN, MHA    Medication changes reported      No    Fall or balance concerns reported     No    Tobacco Cessation  No Change    Warm-up and Cool-down  Not performed (comment)    Resistance Training Performed  Yes    VAD Patient?  No    PAD/SET Patient?  No      Pain Assessment   Currently in Pain?  No/denies    Multiple Pain Sites  No       Capillary Blood Glucose: No results found for this or any previous visit (from the past 24 hour(s)).    Social History   Tobacco Use  Smoking Status Never Smoker  Smokeless Tobacco Never Used    Goals Met:  Exercise tolerated well No report of cardiac concerns or symptoms Strength training completed today  Goals Unmet:  Not Applicable  Comments: Service time is from 1030 to 1215    Dr. Rush Farmer is Medical Director for Pulmonary Rehab at [facility].

## 2017-12-25 ENCOUNTER — Encounter (HOSPITAL_COMMUNITY)
Admission: RE | Admit: 2017-12-25 | Discharge: 2017-12-25 | Disposition: A | Discharge status: Home / Self Care | Payer: Medicare Other | Source: Ambulatory Visit | Attending: Internal Medicine | Admitting: Internal Medicine

## 2017-12-25 DIAGNOSIS — K219 Gastro-esophageal reflux disease without esophagitis: Secondary | ICD-10-CM | POA: Diagnosis not present

## 2017-12-25 DIAGNOSIS — I2510 Atherosclerotic heart disease of native coronary artery without angina pectoris: Secondary | ICD-10-CM | POA: Diagnosis not present

## 2017-12-25 DIAGNOSIS — I700 Atherosclerosis of aorta: Secondary | ICD-10-CM | POA: Diagnosis not present

## 2017-12-25 DIAGNOSIS — J439 Emphysema, unspecified: Secondary | ICD-10-CM

## 2017-12-25 DIAGNOSIS — J438 Other emphysema: Secondary | ICD-10-CM | POA: Diagnosis not present

## 2017-12-25 DIAGNOSIS — I10 Essential (primary) hypertension: Secondary | ICD-10-CM | POA: Diagnosis not present

## 2017-12-25 DIAGNOSIS — E785 Hyperlipidemia, unspecified: Secondary | ICD-10-CM | POA: Diagnosis not present

## 2017-12-25 NOTE — Progress Notes (Signed)
Daily Session Note  Patient Details  Name: Karen West MRN: [id number] Date of Birth: 1940/08/21 Referring Provider:     Pulmonary Rehab Walk Test from 10/04/2017 in [place]  Referring Provider  Dr. Chase Caller      Encounter Date: 12/25/2017  Check In: Session Check In - 12/25/17 1009      Check-In   Supervising physician immediately available to respond to emergencies  [facility] immediately available    Physician(s)  Dr. Horris Latino    Location  [facility]    Staff Present  Su Hilt, MS, ACSM [name], Exercise Physiologist;Lisa Colletta [place], RN, MHA    Medication changes reported      No    Fall or balance concerns reported     No    Tobacco Cessation  No Change    Warm-up and Cool-down  Performed as group-led instruction    Resistance Training Performed  Yes    VAD Patient?  No    PAD/SET Patient?  No      Pain Assessment   Currently in Pain?  No/denies    Multiple Pain Sites  No       Capillary Blood Glucose: No results found for this or any previous visit (from the past 24 hour(s)).    Social History   Tobacco Use  Smoking Status Never Smoker  Smokeless Tobacco Never Used    Goals Met:  Exercise tolerated well  Goals Unmet:  Not Applicable  Comments: Service time is from 10:30a to 12:10p    Dr. Rush Farmer is Medical Director for Pulmonary Rehab at [facility].

## 2017-12-27 ENCOUNTER — Encounter (HOSPITAL_COMMUNITY)
Admission: RE | Admit: 2017-12-27 | Discharge: 2017-12-27 | Disposition: A | Discharge status: Home / Self Care | Payer: Medicare Other | Source: Ambulatory Visit | Attending: Internal Medicine | Admitting: Internal Medicine

## 2017-12-27 DIAGNOSIS — J438 Other emphysema: Secondary | ICD-10-CM | POA: Diagnosis not present

## 2017-12-27 DIAGNOSIS — I700 Atherosclerosis of aorta: Secondary | ICD-10-CM | POA: Diagnosis not present

## 2017-12-27 DIAGNOSIS — K219 Gastro-esophageal reflux disease without esophagitis: Secondary | ICD-10-CM | POA: Diagnosis not present

## 2017-12-27 DIAGNOSIS — E785 Hyperlipidemia, unspecified: Secondary | ICD-10-CM | POA: Diagnosis not present

## 2017-12-27 DIAGNOSIS — I10 Essential (primary) hypertension: Secondary | ICD-10-CM | POA: Diagnosis not present

## 2017-12-27 DIAGNOSIS — I2510 Atherosclerotic heart disease of native coronary artery without angina pectoris: Secondary | ICD-10-CM | POA: Diagnosis not present

## 2017-12-27 DIAGNOSIS — J439 Emphysema, unspecified: Secondary | ICD-10-CM

## 2017-12-27 NOTE — Progress Notes (Signed)
Daily Session Note  Patient Details  Name: Karen West MRN: [id number] Date of Birth: 1940-03-23 Referring Provider:     Pulmonary Rehab Walk Test from 10/04/2017 in [place]  Referring Provider  Dr. Chase Caller      Encounter Date: 12/27/2017  Check In: Session Check In - 12/27/17 1252      Check-In   Supervising physician immediately available to respond to emergencies  [facility] immediately available    Physician(s)  Dr. Herbert Moors    Location  [facility]    Staff Present  Su Hilt, MS, ACSM [name], Exercise Physiologist;Lisa Colletta [place], RN, MHA;Dalton Kris Mouton, MS, Exercise Physiologist    Medication changes reported      No    Fall or balance concerns reported     No    Tobacco Cessation  No Change    Warm-up and Cool-down  Performed as group-led instruction    Resistance Training Performed  Yes    VAD Patient?  No    PAD/SET Patient?  No      Pain Assessment   Currently in Pain?  No/denies    Multiple Pain Sites  No       Capillary Blood Glucose: No results found for this or any previous visit (from the past 24 hour(s)).    Social History   Tobacco Use  Smoking Status Never Smoker  Smokeless Tobacco Never Used    Goals Met:  Exercise tolerated well  Goals Unmet:  Not Applicable  Comments: Service time is from 10:30a to 12:40p    Dr. Rush Farmer is Medical Director for Pulmonary Rehab at [facility].

## 2018-01-01 ENCOUNTER — Encounter (HOSPITAL_COMMUNITY)
Admission: RE | Admit: 2018-01-01 | Discharge: 2018-01-01 | Disposition: A | Discharge status: Home / Self Care | Payer: Medicare Other | Source: Ambulatory Visit | Attending: Internal Medicine | Admitting: Internal Medicine

## 2018-01-01 VITALS — Wt 149.9 lb

## 2018-01-01 DIAGNOSIS — I700 Atherosclerosis of aorta: Secondary | ICD-10-CM | POA: Diagnosis not present

## 2018-01-01 DIAGNOSIS — E785 Hyperlipidemia, unspecified: Secondary | ICD-10-CM | POA: Diagnosis not present

## 2018-01-01 DIAGNOSIS — K219 Gastro-esophageal reflux disease without esophagitis: Secondary | ICD-10-CM | POA: Diagnosis not present

## 2018-01-01 DIAGNOSIS — I10 Essential (primary) hypertension: Secondary | ICD-10-CM | POA: Diagnosis not present

## 2018-01-01 DIAGNOSIS — I2510 Atherosclerotic heart disease of native coronary artery without angina pectoris: Secondary | ICD-10-CM | POA: Diagnosis not present

## 2018-01-01 DIAGNOSIS — J439 Emphysema, unspecified: Secondary | ICD-10-CM

## 2018-01-01 DIAGNOSIS — J438 Other emphysema: Secondary | ICD-10-CM | POA: Diagnosis not present

## 2018-01-01 NOTE — Progress Notes (Signed)
Daily Session Note  Patient Details  Name: Karen West MRN: [id number] Date of Birth: 04-20-1940 Referring Provider:     Pulmonary Rehab Walk Test from 10/04/2017 in [place]  Referring Provider  Dr. Chase Caller      Encounter Date: 01/01/2018  Check In: Session Check In - 01/01/18 1014      Check-In   Supervising physician immediately available to respond to emergencies  [facility] immediately available    Physician(s)  Dr. Herbert Moors    Location  [facility]    Staff Present  [name], MS, Exercise Physiologist;Lisa [name], [name], RN, MHA;Maily Debarge, MS, ACSM [name], Exercise Physiologist    Medication changes reported      No    Fall or balance concerns reported     No    Tobacco Cessation  No Change    Warm-up and Cool-down  Performed as group-led instruction    Resistance Training Performed  Yes    VAD Patient?  No    PAD/SET Patient?  No      Pain Assessment   Currently in Pain?  No/denies    Multiple Pain Sites  No       Capillary Blood Glucose: No results found for this or any previous visit (from the past 24 hour(s)). POCT Glucose - 01/01/18 1246      POCT Blood Glucose   Pre-Exercise  127 mg/dL    Post-Exercise  148 mg/dL      Exercise Prescription Changes - 01/01/18 1200      Response to Exercise   Blood Pressure (Admit)  124/60    Blood Pressure (Exercise)  132/64    Blood Pressure (Exit)  112/60    Heart Rate (Admit)  83 bpm    Heart Rate (Exercise)  112 bpm    Heart Rate (Exit)  86 bpm    Oxygen Saturation (Admit)  99 %    Oxygen Saturation (Exercise)  97 %    Oxygen Saturation (Exit)  97 %    Rating of Perceived Exertion (Exercise)  11    Perceived Dyspnea (Exercise)  2    Duration  Progress to 45 minutes of aerobic exercise without signs/symptoms of physical distress    Intensity  THRR unchanged      Progression   Progression  Continue to progress workloads  to maintain intensity without signs/symptoms of physical distress.      Resistance Training   Training Prescription  Yes    Weight  orange bands    Reps  10-15    Time  10 Minutes      Interval Training   Interval Training  No      NuStep   Level  5    SPM  80    Minutes  17    METs  2.8      Track   Laps  22    Minutes  34       Social History   Tobacco Use  Smoking Status Never Smoker  Smokeless Tobacco Never Used    Goals Met:  Personal goals reviewed  Goals Unmet:  Not Applicable  Comments: Service time is from 10:30A to 12:00P    Dr. Rush Farmer is Medical Director for Pulmonary Rehab at [facility].

## 2018-01-02 NOTE — Progress Notes (Signed)
Pulmonary Individual Treatment Plan  Patient Details  Name: Karen West MRN: [id number] Date of Birth: 18-Sep-1940 Referring Provider:     Pulmonary Rehab Walk Test from 10/04/2017 in [facility]  Referring Provider  Dr. Chase Caller      Initial Encounter Date:    Pulmonary Rehab Walk Test from 10/04/2017 in [facility]  Date  10/04/17      Visit Diagnosis: Pulmonary emphysema, unspecified emphysema type ([place])  Patient's Home Medications on Admission:   Current Outpatient Medications:  .  acetaminophen (TYLENOL) 500 MG tablet, Take 1,000 mg by mouth every 6 (six) hours as needed for moderate pain or headache., Disp: , Rfl:  .  amLODipine (NORVASC) 10 MG tablet, Take 10 mg by mouth daily., Disp: , Rfl:  .  aspirin 81 MG tablet, Take 81 mg by mouth daily.  , Disp: , Rfl:  .  ezetimibe (ZETIA) 10 MG tablet, Take 5 mg by mouth daily. , Disp: , Rfl:  .  ferrous sulfate 325 (65 FE) MG tablet, TAKE 1 TABLET (325 MG TOTAL) BY MOUTH 3 (THREE) TIMES DAILY WITH MEALS., Disp: 270 tablet, Rfl: 0 .  glipiZIDE (GLUCOTROL) 10 MG 24 hr tablet, Take 10 mg by mouth 2 (two) times daily.  , Disp: , Rfl:  .  hydrocortisone cream 1 %, Apply 1 application topically 2 (two) times daily., Disp: , Rfl:  .  levothyroxine (SYNTHROID, LEVOTHROID) 88 MCG tablet, Take 88 mcg by mouth daily., Disp: , Rfl: 5 .  Magnesium Oxide 200 MG TABS, Take 1 tablet by mouth daily., Disp: , Rfl:  .  metFORMIN (GLUCOPHAGE) 1000 MG tablet, Take 1,000 mg by mouth 2 (two) times daily. , Disp: , Rfl:  .  Omega-3 Fatty Acids (FISH OIL) 1200 MG CAPS, Take 1,200 mg by mouth 2 (two) times daily. , Disp: , Rfl:  .  omeprazole (PRILOSEC) 20 MG capsule, Take 20 mg by mouth 2 (two) times daily. Takes prn, Disp: , Rfl:  .  rosuvastatin (CRESTOR) 20 MG tablet, Take 20 mg by mouth daily., Disp: , Rfl:   Past Medical History: Past Medical History:  Diagnosis Date  . Atherosclerosis  of aorta ([place])    a. 01/2017/03/2017 - noted on high res chest CTs.  . Breast cancer ([place])    a. Bilateral --> s/p left mastectomy  . Carotid artery disease ([place])    a. 08/7339 w/ 93-79% LICA stenosis and <02% RICA stenosis; b. 10/2015 Carotid U/S: < 50% BICA stenosis  . Chest pain    a. 09/2011 MV: EF 68%, no ischemia/infarct.  . Chronic anemia   . Chronic headaches    denies  . Coronary artery calcification seen on CT scan    a. 01/2017 High res CT: atherosclerotic calcification of the arterial vascularture, including severe involvement of the coronary arteries; b. 03/2017 CT Chest: coronary and Ao atheroscelrosis.  Marland Kitchen GERD (gastroesophageal reflux disease)   . History of echocardiogram    a. 09/2011 Echo: EF 55-60%, no rwma, triv AI, PASP 47mHg.  .Marland KitchenHyperlipidemia   . Hypertension   . Hyperthyroidism   . Left upper lobe pulmonary nodule    a. 02/2017 PET: slowing enlarging 1.7cm LUL nodule w/ low-grade metabolic activity; b. 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 05/2017 s/p VATS.  . Multinodular goiter    a. 02/2017 PET scan- Hypermetabolic nodule;  b. 25/3299s/p thyroidectomy  . Obesity   . OSA on CPAP    cpap  .  Osteoarthritis   . Personal history of radiation therapy 1999  . Right bundle branch block   . Type II or unspecified type diabetes mellitus without mention of complication, uncontrolled     Tobacco Use: Social History   Tobacco Use  Smoking Status Never Smoker  Smokeless Tobacco Never Used    Labs: Recent Review Flowsheet Data    Labs for ITP Cardiac and Pulmonary Rehab Latest Ref Rng & Units 10/04/2011 10/05/2011 05/08/2017 05/31/2017 06/05/2017   Cholestrol 0 - 200 mg/dL - 141 - - -   LDLCALC 0 - 99 mg/dL - 65 - - -   HDL >39 mg/dL - 49 - - -   Trlycerides <150 mg/dL - 137 - - -   Hemoglobin A1c 4.8 - 5.6 % 6.1(H) - 6.9(H) - -   PHART 7.350 - 7.450 - - - 7.465(H) 7.460(H)   PCO2ART 32.0 - 48.0 mmHg - - - 36.8 37.2   HCO3 20.0 - 28.0 mmol/L - - - 26.1 26.2    O2SAT % - - - 98.5 95.1      Capillary Blood Glucose: Lab Results  Component Value Date   GLUCAP 244 (H) 06/15/2017   GLUCAP 90 06/15/2017   GLUCAP 156 (H) 06/14/2017   GLUCAP 119 (H) 06/14/2017   GLUCAP 194 (H) 06/14/2017   POCT Glucose    Row Name 10/23/17 1242 11/06/17 1259 11/20/17 1224 12/04/17 1300 12/18/17 1259     POCT Blood Glucose   Pre-Exercise  119 mg/dL  130 mg/dL  127 mg/dL  110 mg/dL  115 mg/dL   Post-Exercise  211 mg/dL  192 mg/dL  165 mg/dL  225 mg/dL  175 mg/dL   Row Name 01/01/18 1246             POCT Blood Glucose   Pre-Exercise  127 mg/dL       Post-Exercise  148 mg/dL          Pulmonary Assessment Scores: Pulmonary Assessment Scores    Row Name 10/05/17 0845         ADL UCSD   ADL Phase  Entry       mMRC Score   mMRC Score  0        Pulmonary Function Assessment:   Exercise Target Goals: Exercise Program Goal: Individual exercise prescription set using results from initial 6 min walk test and THRR while considering  patient's activity barriers and safety.   Exercise Prescription Goal: Initial exercise prescription builds to 30-45 minutes a day of aerobic activity, 2-3 days per week.  Home exercise guidelines will be given to patient during program as part of exercise prescription that the participant will acknowledge.  Activity Barriers & Risk Stratification: Activity Barriers & Cardiac Risk Stratification - 09/26/17 1452      Activity Barriers & Cardiac Risk Stratification   Activity Barriers  Arthritis;Shortness of Breath;Incisional Pain;Deconditioning   rt foot      6 Minute Walk: 6 Minute Walk    Row Name 10/05/17 0845         6 Minute Walk   Phase  Initial     Distance  720 feet     Walk Time  6 minutes     # of Rest Breaks  0     MPH  1.36     METS  2.07     RPE  11     Perceived Dyspnea   2     Symptoms  Yes (comment)  Comments  used wheelchair due to pain in foot 2/10 pain after ambulating     Resting  HR  94 bpm     Resting BP  144/60     Resting Oxygen Saturation   96 %     Exercise Oxygen Saturation  during 6 min walk  96 %     Max Ex. HR  130 bpm     Max Ex. BP  144/64       Interval HR   1 Minute HR  113     2 Minute HR  123     3 Minute HR  123     4 Minute HR  130     5 Minute HR  129     6 Minute HR  123     2 Minute Post HR  109     Interval Heart Rate?  Yes       Interval Oxygen   Interval Oxygen?  Yes     Baseline Oxygen Saturation %  96 %     1 Minute Oxygen Saturation %  97 %     1 Minute Liters of Oxygen  0 L     2 Minute Oxygen Saturation %  96 %     2 Minute Liters of Oxygen  0 L     3 Minute Oxygen Saturation %  96 %     3 Minute Liters of Oxygen  0 L     4 Minute Oxygen Saturation %  96 %     4 Minute Liters of Oxygen  0 L     5 Minute Oxygen Saturation %  96 %     5 Minute Liters of Oxygen  0 L     6 Minute Oxygen Saturation %  96 %     6 Minute Liters of Oxygen  0 L     2 Minute Post Oxygen Saturation %  95 %     2 Minute Post Liters of Oxygen  0 L        Oxygen Initial Assessment: Oxygen Initial Assessment - 10/05/17 0844      Initial 6 min Walk   Oxygen Used  None      Program Oxygen Prescription   Program Oxygen Prescription  None       Oxygen Re-Evaluation: Oxygen Re-Evaluation    Row Name 11/05/17 8366 12/03/17 1636 01/01/18 1550         Program Oxygen Prescription   Program Oxygen Prescription  None  None  None       Home Oxygen   Home Oxygen Device  None  None  None     Sleep Oxygen Prescription  None  None  None     Home Exercise Oxygen Prescription  None  None  None     Home at Rest Exercise Oxygen Prescription  None  None  None        Oxygen Discharge (Final Oxygen Re-Evaluation): Oxygen Re-Evaluation - 01/01/18 1550      Program Oxygen Prescription   Program Oxygen Prescription  None      Home Oxygen   Home Oxygen Device  None    Sleep Oxygen Prescription  None    Home Exercise Oxygen Prescription  None     Home at Rest Exercise Oxygen Prescription  None       Initial Exercise Prescription: Initial Exercise Prescription - 10/05/17 0800      Date of Initial Exercise  RX and Referring Provider   Date  10/04/17    Referring Provider  Dr. Chase Caller      NuStep   Level  2    SPM  80    Minutes  17    METs  1.5      Arm Ergometer   Level  1    Watts  10    Minutes  17      Track   Laps  8    Minutes  17      Prescription Details   Frequency (times per week)  2    Duration  Progress to 45 minutes of aerobic exercise without signs/symptoms of physical distress      Intensity   THRR 40-80% of Max Heartrate  58-116    Ratings of Perceived Exertion  11-13    Perceived Dyspnea  0-4      Progression   Progression  Continue progressive overload as per policy without signs/symptoms or physical distress.      Resistance Training   Training Prescription  Yes    Weight  orange bands    Reps  10-15       Perform Capillary Blood Glucose checks as needed.  Exercise Prescription Changes:  Exercise Prescription Changes    Row Name 10/23/17 1200 11/06/17 1200 11/15/17 0600 11/20/17 1200 12/04/17 1300     Response to Exercise   Blood Pressure (Admit)  120/60  132/60  -  122/68  140/60   Blood Pressure (Exercise)  154/78  140/64  -  142/60  150/60   Blood Pressure (Exit)  122/62  122/58  -  118/60  104/50   Heart Rate (Admit)  78 bpm  82 bpm  -  80 bpm  79 bpm   Heart Rate (Exercise)  99 bpm  103 bpm  -  113 bpm  121 bpm   Heart Rate (Exit)  78 bpm  75 bpm  -  73 bpm  86 bpm   Oxygen Saturation (Admit)  98 %  99 %  -  99 %  98 %   Oxygen Saturation (Exercise)  96 %  99 %  -  95 %  95 %   Oxygen Saturation (Exit)  96 %  98 %  -  99 %  97 %   Rating of Perceived Exertion (Exercise)  13  13  -  13  13   Perceived Dyspnea (Exercise)  1  2  -  2  2   Duration  Progress to 45 minutes of aerobic exercise without signs/symptoms of physical distress  Progress to 45 minutes of aerobic  exercise without signs/symptoms of physical distress  -  Progress to 45 minutes of aerobic exercise without signs/symptoms of physical distress  Progress to 45 minutes of aerobic exercise without signs/symptoms of physical distress   Intensity  THRR unchanged  THRR unchanged  -  THRR unchanged  THRR unchanged     Progression   Progression  Continue to progress workloads to maintain intensity without signs/symptoms of physical distress.  Continue to progress workloads to maintain intensity without signs/symptoms of physical distress.  -  Continue to progress workloads to maintain intensity without signs/symptoms of physical distress.  Continue to progress workloads to maintain intensity without signs/symptoms of physical distress.     Resistance Training   Training Prescription  Yes  Yes  -  Yes  Yes   Weight  orange bands  orange bands  -  orange bands  orange bands   Reps  10-15  10-15  -  10-15  10-15   Time  -  -  -  10 Minutes  10 Minutes     Interval Training   Interval Training  -  -  -  No  No     NuStep   Level  3  3  -  2  4   SPM  80  80  -  80  80   Minutes  17  17  -  17  17   METs  2.8  2.7  -  1.9  3.1     Arm Ergometer   Level  1  3  -  -  -   Watts  10  10  -  -  -   Minutes  98  17  -  -  -     Track   Laps  -  8  -  10  22   Minutes  -  17  -  17  17     Home Exercise Plan   Plans to continue exercise at  -  -  Home (comment)  -  -   Frequency  -  -  Add 2 additional days to program exercise sessions.  -  -   Row Name 12/18/17 1200 01/01/18 1200           Response to Exercise   Blood Pressure (Admit)  138/68  124/60      Blood Pressure (Exercise)  154/62  132/64      Blood Pressure (Exit)  118/50  112/60      Heart Rate (Admit)  79 bpm  83 bpm      Heart Rate (Exercise)  123 bpm  112 bpm      Heart Rate (Exit)  78 bpm  86 bpm      Oxygen Saturation (Admit)  99 %  99 %      Oxygen Saturation (Exercise)  93 %  97 %      Oxygen Saturation (Exit)  99 %  97  %      Rating of Perceived Exertion (Exercise)  13  11      Perceived Dyspnea (Exercise)  2  2      Duration  Progress to 45 minutes of aerobic exercise without signs/symptoms of physical distress  Progress to 45 minutes of aerobic exercise without signs/symptoms of physical distress      Intensity  THRR unchanged  THRR unchanged        Progression   Progression  Continue to progress workloads to maintain intensity without signs/symptoms of physical distress.  Continue to progress workloads to maintain intensity without signs/symptoms of physical distress.        Resistance Training   Training Prescription  Yes  Yes      Weight  orange bands  orange bands      Reps  10-15  10-15      Time  10 Minutes  10 Minutes        Interval Training   Interval Training  No  No        NuStep   Level  4  5      SPM  80  80      Minutes  17  17      METs  2.6  2.8        Track  Laps  22  22      Minutes  34  34         Exercise Comments:  Exercise Comments    Row Name 11/15/17 0655           Exercise Comments  Home exercise completed          Exercise Goals and Review:  Exercise Goals    [place] Name 09/26/17 1453             Exercise Goals   Increase Physical Activity  Yes       Intervention  Provide advice, education, support and counseling about physical activity/exercise needs.;Develop an individualized exercise prescription for aerobic and resistive training based on initial evaluation findings, risk stratification, comorbidities and participant's personal goals.       Expected Outcomes  Short Term: Attend rehab on a regular basis to increase amount of physical activity.;Long Term: Add in home exercise to make exercise part of routine and to increase amount of physical activity.;Long Term: Exercising regularly at least 3-5 days a week.       Increase Strength and Stamina  Yes       Intervention  Develop an individualized exercise prescription for aerobic and resistive  training based on initial evaluation findings, risk stratification, comorbidities and participant's personal goals.;Provide advice, education, support and counseling about physical activity/exercise needs.       Expected Outcomes  Short Term: Increase workloads from initial exercise prescription for resistance, speed, and METs.;Short Term: Perform resistance training exercises routinely during rehab and add in resistance training at home;Long Term: Improve cardiorespiratory fitness, muscular endurance and strength as measured by increased METs and functional capacity (6MWT)       Able to understand and use rate of perceived exertion (RPE) scale  Yes       Intervention  Provide education and explanation on how to use RPE scale       Expected Outcomes  Short Term: Able to use RPE daily in rehab to express subjective intensity level;Long Term:  Able to use RPE to guide intensity level when exercising independently       Able to understand and use Dyspnea scale  Yes       Intervention  Provide education and explanation on how to use Dyspnea scale       Expected Outcomes  Short Term: Able to use Dyspnea scale daily in rehab to express subjective sense of shortness of breath during exertion;Long Term: Able to use Dyspnea scale to guide intensity level when exercising independently       Knowledge and understanding of Target Heart Rate Range (THRR)  Yes       Intervention  Provide education and explanation of THRR including how the numbers were predicted and where they are located for reference       Expected Outcomes  Short Term: Able to state/look up THRR;Long Term: Able to use THRR to govern intensity when exercising independently;Short Term: Able to use daily as guideline for intensity in rehab       Understanding of Exercise Prescription  Yes       Intervention  Provide education, explanation, and written materials on patient's individual exercise prescription       Expected Outcomes  Short Term: Able to  explain program exercise prescription;Long Term: Able to explain home exercise prescription to exercise independently          Exercise Goals Re-Evaluation : Exercise Goals Re-Evaluation    Row Name  11/05/17 9892 12/03/17 1636 01/01/18 1550         Exercise Goal Re-Evaluation   Exercise Goals Review  Increase Physical Activity;Increase Strength and Stamina;Able to understand and use rate of perceived exertion (RPE) scale;Able to understand and use Dyspnea scale;Knowledge and understanding of Target Heart Rate Range (THRR);Understanding of Exercise Prescription  Increase Physical Activity;Increase Strength and Stamina;Able to understand and use rate of perceived exertion (RPE) scale;Able to understand and use Dyspnea scale;Knowledge and understanding of Target Heart Rate Range (THRR);Understanding of Exercise Prescription  Increase Physical Activity;Increase Strength and Stamina;Able to understand and use rate of perceived exertion (RPE) scale;Able to understand and use Dyspnea scale;Knowledge and understanding of Target Heart Rate Range (THRR);Understanding of Exercise Prescription     Comments  Patient has only attended 4 rehab sessions. Will cont. to monitor and progress as able.  Patient is progressing well in program. Tiajuana Amass is her foot pain-- per Dr. I will have to take her off the walking track. MET places her in a moderate level. Will cont to progress and motivate. Will tailor her exercise prescription to her foot pain.   Patient is progressing well in program. Tiajuana Amass is her foot pain-- per Dr. I will have to take her off the walking track. MET places her in a moderate level. Will cont to progress and motivate. Will tailor her exercise prescription to her foot pain.      Expected Outcomes  Through exercise at rehab and at home, the patient will decrease shortness of breath with daily activities and feel confident in carrying out an exercise regime at home.   Through exercise at rehab and at  home, the patient will decrease shortness of breath with daily activities and feel confident in carrying out an exercise regime at home.   Through exercise at rehab and at home, the patient will decrease shortness of breath with daily activities and feel confident in carrying out an exercise regime at home.         Discharge Exercise Prescription (Final Exercise Prescription Changes): Exercise Prescription Changes - 01/01/18 1200      Response to Exercise   Blood Pressure (Admit)  124/60    Blood Pressure (Exercise)  132/64    Blood Pressure (Exit)  112/60    Heart Rate (Admit)  83 bpm    Heart Rate (Exercise)  112 bpm    Heart Rate (Exit)  86 bpm    Oxygen Saturation (Admit)  99 %    Oxygen Saturation (Exercise)  97 %    Oxygen Saturation (Exit)  97 %    Rating of Perceived Exertion (Exercise)  11    Perceived Dyspnea (Exercise)  2    Duration  Progress to 45 minutes of aerobic exercise without signs/symptoms of physical distress    Intensity  THRR unchanged      Progression   Progression  Continue to progress workloads to maintain intensity without signs/symptoms of physical distress.      Resistance Training   Training Prescription  Yes    Weight  orange bands    Reps  10-15    Time  10 Minutes      Interval Training   Interval Training  No      NuStep   Level  5    SPM  80    Minutes  17    METs  2.8      Track   Laps  22    Minutes  34  Nutrition:  Target Goals: Understanding of nutrition guidelines, daily intake of sodium '1500mg'$ , cholesterol '200mg'$ , calories 30% from fat and 7% or less from saturated fats, daily to have 5 or more servings of fruits and vegetables.  Biometrics:    Nutrition Therapy Plan and Nutrition Goals:   Nutrition Assessments:   Nutrition Goals Re-Evaluation:   Nutrition Goals Discharge (Final Nutrition Goals Re-Evaluation):   Psychosocial: Target Goals: Acknowledge presence or absence of significant depression and/or  stress, maximize coping skills, provide positive support system. Participant is able to verbalize types and ability to use techniques and skills needed for reducing stress and depression.  Initial Review & Psychosocial Screening: Initial Psych Review & Screening - 11/06/17 0955      Initial Review   Current issues with  None Identified      Family Dynamics   Good Support System?  Yes    Comments  faith, children, sisters      Barriers   Psychosocial barriers to participate in program  There are no identifiable barriers or psychosocial needs.      Screening Interventions   Interventions  Encouraged to exercise       Quality of Life Scores:  Scores of 19 and below usually indicate a poorer quality of life in these areas.  A difference of  2-3 points is a clinically meaningful difference.  A difference of 2-3 points in the total score of the Quality of Life Index has been associated with significant improvement in overall quality of life, self-image, physical symptoms, and general health in studies assessing change in quality of life.  PHQ-9: Recent Review Flowsheet Data    Depression screen [facility] 2/9 10/11/2017 09/26/2017   Decreased Interest 0 0   Down, Depressed, Hopeless 0 0   PHQ - 2 Score 0 0   Altered sleeping 0 0   Tired, decreased energy 1 1   Change in appetite 2 3   Feeling bad or failure about yourself  0 0   Trouble concentrating 0 0   Moving slowly or fidgety/restless 1 0   Suicidal thoughts 0 0   PHQ-9 Score 4 4   Difficult doing work/chores Not difficult at all Not difficult at all     Interpretation of Total Score  Total Score Depression Severity:  1-4 = Minimal depression, 5-9 = Mild depression, 10-14 = Moderate depression, 15-19 = Moderately severe depression, 20-27 = Severe depression   Psychosocial Evaluation and Intervention: Psychosocial Evaluation - 01/02/18 1443      Psychosocial Evaluation & Interventions   Interventions  Encouraged to exercise with  the program and follow exercise prescription    Comments  no identifiable barriers to particpating.  Pt interacts well with staff and fellow particpants. Pt seen holding hands with another pt as they walked around the track.    Expected Outcomes  Pt will continue to display positive outlook on life with healthy coping skills    Continue Psychosocial Services   Follow up required by staff       Psychosocial Re-Evaluation: Psychosocial Re-Evaluation    [place] Name 11/06/17 1000 12/05/17 1413 01/02/18 1443 01/02/18 1444       Psychosocial Re-Evaluation   Current issues with  None Identified  None Identified  None Identified  -    Comments  no identifiable barriers to particpate   no identifiable barriers to particpate   No identifiable barriers to particpate   -    Expected Outcomes  Continue to display positve coping skills  and healthy outlook on life with the appropriate level of support from her family  Continue to display positve coping skills and healthy outlook on life with the appropriate level of support from her family  Continue to display positve coping skills and healthy outlook on life with the appropriate level of support from her family  -    Interventions  Relaxation education;Stress management education;Encouraged to attend Pulmonary Rehabilitation for the exercise  Relaxation education;Stress management education;Encouraged to attend Pulmonary Rehabilitation for the exercise  Relaxation education;Stress management education;Encouraged to attend Pulmonary Rehabilitation for the exercise  -    Continue Psychosocial Services   -  -  Follow up required by staff  -      Initial Review   Source of Stress Concerns  -  -  -  None Identified       Psychosocial Discharge (Final Psychosocial Re-Evaluation): Psychosocial Re-Evaluation - 01/02/18 1444      Initial Review   Source of Stress Concerns  None Identified       Education: Education Goals: Education classes will be provided on  a weekly basis, covering required topics. Participant will state understanding/return demonstration of topics presented.  Learning Barriers/Preferences: Learning Barriers/Preferences - 09/26/17 1451      Learning Barriers/Preferences   Learning Barriers  Sight    Learning Preferences  Computer/Internet;Group Instruction;Individual Instruction;Verbal Instruction;Video;Written Material       Education Topics: Risk Factor Reduction:  -Group instruction that is supported by a PowerPoint presentation. Instructor discusses the definition of a risk factor, different risk factors for pulmonary disease, and how the heart and lungs work together.     Nutrition for Pulmonary Patient:  -Group instruction provided by PowerPoint slides, verbal discussion, and written materials to support subject matter. The instructor gives an explanation and review of healthy diet recommendations, which includes a discussion on weight management, recommendations for fruit and vegetable consumption, as well as protein, fluid, caffeine, fiber, sodium, sugar, and alcohol. Tips for eating when patients are short of breath are discussed.   PULMONARY REHAB OTHER RESPIRATORY from 12/20/2017 in [place]  Date  11/15/17  Educator  Rodman Pickle  Instruction Review Code  2- Demonstrated Understanding      Pursed Lip Breathing:  -Group instruction that is supported by demonstration and informational handouts. Instructor discusses the benefits of pursed lip and diaphragmatic breathing and detailed demonstration on how to preform both.     Oxygen Safety:  -Group instruction provided by PowerPoint, verbal discussion, and written material to support subject matter. There is an overview of "What is Oxygen" and "Why do we need it".  Instructor also reviews how to create a safe environment for oxygen use, the importance of using oxygen as prescribed, and the risks of noncompliance. There is a brief  discussion on traveling with oxygen and resources the patient may utilize.   PULMONARY REHAB OTHER RESPIRATORY from 12/20/2017 in [place]  Date  12/20/17  Educator  molly  Instruction Review Code  1- Verbalizes Understanding      Oxygen Equipment:  -Group instruction provided by [employer], written materials, and Insurance underwriter.   PULMONARY REHAB OTHER RESPIRATORY from 12/20/2017 in [place] [address]  Date  10/11/17  Educator  Ace Gins  Instruction Review Code  2- Demonstrated Understanding      Signs and Symptoms:  -Group instruction provided by written material and verbal discussion to support subject matter. Warning signs  and symptoms of infection, stroke, and heart attack are reviewed and when to call the physician/911 reinforced. Tips for preventing the spread of infection discussed.   PULMONARY REHAB OTHER RESPIRATORY from 12/20/2017 in [place]  Date  12/13/17  Educator  RN  Instruction Review Code  2- Demonstrated Understanding      Advanced Directives:  -Group instruction provided by verbal instruction and written material to support subject matter. Instructor reviews Advanced Directive laws and proper instruction for filling out document.   Pulmonary Video:  -Group video education that reviews the importance of medication and oxygen compliance, exercise, good nutrition, pulmonary hygiene, and pursed lip and diaphragmatic breathing for the pulmonary patient.   Exercise for the Pulmonary Patient:  -Group instruction that is supported by a PowerPoint presentation. Instructor discusses benefits of exercise, core components of exercise, frequency, duration, and intensity of an exercise routine, importance of utilizing pulse oximetry during exercise, safety while exercising, and options of places to exercise outside of rehab.     PULMONARY REHAB OTHER  RESPIRATORY from 12/20/2017 in [place]  Date  10/18/17  Educator  Cloyde Reams  Instruction Review Code  1- Verbalizes Understanding      Pulmonary Medications:  -Verbally interactive group education provided by instructor with focus on inhaled medications and proper administration.   PULMONARY REHAB OTHER RESPIRATORY from 12/20/2017 in [place]  Date  11/27/17  Educator  pharm      Anatomy and Physiology of the Respiratory System and Intimacy:  -Group instruction provided by PowerPoint, verbal discussion, and written material to support subject matter. Instructor reviews respiratory cycle and anatomical components of the respiratory system and their functions. Instructor also reviews differences in obstructive and restrictive respiratory diseases with examples of each. Intimacy, Sex, and Sexuality differences are reviewed with a discussion on how relationships can change when diagnosed with pulmonary disease. Common sexual concerns are reviewed.   MD DAY -A group question and answer session with a medical doctor that allows participants to ask questions that relate to their pulmonary disease state.   PULMONARY REHAB OTHER RESPIRATORY from 12/20/2017 in [place]  Date  10/23/17  Educator  yacoub  Instruction Review Code  2- Demonstrated Understanding      OTHER EDUCATION -Group or individual verbal, written, or video instructions that support the educational goals of the pulmonary rehab program.   Holiday Eating Survival Tips:  -Group instruction provided by PowerPoint slides, verbal discussion, and written materials to support subject matter. The instructor gives patients tips, tricks, and techniques to help them not only survive but enjoy the holidays despite the onslaught of food that accompanies the holidays.   Knowledge Questionnaire Score: Knowledge Questionnaire Score - 09/27/17  1459      Knowledge Questionnaire Score   Pre Score  --   pre score      Core Components/Risk Factors/Patient Goals at Admission: Personal Goals and Risk Factors at Admission - 09/26/17 1436      Core Components/Risk Factors/Patient Goals on Admission    Weight Management  Weight Maintenance;Yes    Intervention  Weight Management: Provide education and appropriate resources to help participant work on and attain dietary goals.;Weight Management: Develop a combined nutrition and exercise program designed to reach desired caloric intake, while maintaining appropriate intake of nutrient and fiber, sodium and fats, and appropriate energy expenditure required for the weight goal.    Expected Outcomes  Short  Term: Continue to assess and modify interventions until short term weight is achieved;Weight Maintenance: Understanding of the daily nutrition guidelines, which includes 25-35% calories from fat, 7% or less cal from saturated fats, less than '200mg'$  cholesterol, less than 1.5gm of sodium, & 5 or more servings of fruits and vegetables daily;Understanding recommendations for meals to include 15-35% energy as protein, 25-35% energy from fat, 35-60% energy from carbohydrates, less than '200mg'$  of dietary cholesterol, 20-35 gm of total fiber daily;Understanding of distribution of calorie intake throughout the day with the consumption of 4-5 meals/snacks    Improve shortness of breath with ADL's  Yes    Intervention  Provide education, individualized exercise plan and daily activity instruction to help decrease symptoms of SOB with activities of daily living.    Expected Outcomes  Short Term: Improve cardiorespiratory fitness to achieve a reduction of symptoms when performing ADLs;Long Term: Be able to perform more ADLs without symptoms or delay the onset of symptoms    Diabetes  Yes    Intervention  Provide education about signs/symptoms and action to take for hypo/hyperglycemia.;Provide education about  proper nutrition, including hydration, and aerobic/resistive exercise prescription along with prescribed medications to achieve blood glucose in normal ranges: Fasting glucose 65-99 mg/dL    Expected Outcomes  Short Term: Participant verbalizes understanding of the signs/symptoms and immediate care of hyper/hypoglycemia, proper foot care and importance of medication, aerobic/resistive exercise and nutrition plan for blood glucose control.;Long Term: Attainment of HbA1C < 7%.       Core Components/Risk Factors/Patient Goals Review:  Goals and Risk Factor Review    Row Name 11/06/17 1002 11/07/17 0930 12/05/17 1413 01/02/18 1445 01/02/18 1510     Core Components/Risk Factors/Patient Goals Review   Personal Goals Review  Weight Management/Obesity;Improve shortness of breath with ADL's;Increase knowledge of respiratory medications and ability to use respiratory devices properly.;Develop more efficient breathing techniques such as purse lipped breathing and diaphragmatic breathing and practicing self-pacing with activity.;Diabetes  -  Weight Management/Obesity;Improve shortness of breath with ADL's;Increase knowledge of respiratory medications and ability to use respiratory devices properly.;Develop more efficient breathing techniques such as purse lipped breathing and diaphragmatic breathing and practicing self-pacing with activity.;Diabetes  Weight Management/Obesity;Improve shortness of breath with ADL's;Increase knowledge of respiratory medications and ability to use respiratory devices properly.;Develop more efficient breathing techniques such as purse lipped breathing and diaphragmatic breathing and practicing self-pacing with activity.;Diabetes  (Pended)   Weight Management/Obesity;Improve shortness of breath with ADL's;Increase knowledge of respiratory medications and ability to use respiratory devices properly.;Develop more efficient breathing techniques such as purse lipped breathing and diaphragmatic  breathing and practicing self-pacing with activity.;Diabetes   Review  Pt has completed 4 exercise sessions since starting on 7/25 and 1 incomplete due to elevation of CBG 357. Pt has a rheumatolgy consult to determine the cause of her discomfort to her great toe.  Unable to adequately assess pt progress toward pulmnary goals.  I anticipate pt will show pogress toward goals and improved managment of her diabetes  -  Pt has completed 13 exercise sessions.  Pt progress has been hampered with pt issues with the top of her foot.  Pt has slow healing area and is taking prednisone.  Pt given guidelines to observe which involves resting her foot more. Pt observed using PLB techniques particularly while ambulating on the track/stairs  Pt reates her shortness of breath a level 2 for dypnea. Pt weight is elevated to 1.2 kg due in part by her inability to be as active  at home because of the restrictions for her foot.  Pt with reportable readings for CBG well within range of acceptablitity. Pt workloads show average of 18-24 laps around the track during two stations and using the stairs, nustep level 4 .  Continue to monitor pt progress during the next 30 day assessment.  Pt has completed 13 exercise sessions.  Pt progress has been hampered with pt issues with the top of her foot.  Pt has slow healing area and is taking prednisone.  Pt given guidelines to observe which involves resting her foot more. Pt observed using PLB techniques particularly while ambulating on the track/stairs  Pt reates her shortness of breath a level 2 for dypnea. Pt weight is elevated to 1.2 kg due in part by her inability to be as active at home because of the restrictions for her foot.  Pt with reportable readings for CBG well within range of acceptablitity. Pt workloads show average of 18-24 laps around the track during two stations and using the stairs, nustep level 4 .  Continue to monitor pt progress during the next 30 day assessment.  (Pended)    Pt has completed 21exercise sessions.  Pt progress has been hampered with pt issues with the top of her foot.  Pt has slow healing area and is continuing to take prednisone.  Pt given guidelines to observe which involves resting her foot more at home. Pt observed using PLB techniques particularly while ambulating on the track/stairs  Pt rates her shortness of breath a level 1-2 for dyspnea. Pt weight is elevated to 2.6 kg due in part by her inability to be as active at home because of the restrictions for her foot.  Pt with reportable readings for CBG well within range of acceptablitity. Pt workloads show average of 18-24 laps around the track/stairs during two 15 stations, nustep level 5.  Continue to monitor pt progress during the next 30 day assessment.   Expected Outcomes  See admission goals  See Admission Goals/Outcomes  See Admission Goals/Outcomes  -  See Admission Goals/Outcomes      Core Components/Risk Factors/Patient Goals at Discharge (Final Review):  Goals and Risk Factor Review - 01/02/18 1510      Core Components/Risk Factors/Patient Goals Review   Personal Goals Review  Weight Management/Obesity;Improve shortness of breath with ADL's;Increase knowledge of respiratory medications and ability to use respiratory devices properly.;Develop more efficient breathing techniques such as purse lipped breathing and diaphragmatic breathing and practicing self-pacing with activity.;Diabetes    Review  Pt has completed 21exercise sessions.  Pt progress has been hampered with pt issues with the top of her foot.  Pt has slow healing area and is continuing to take prednisone.  Pt given guidelines to observe which involves resting her foot more at home. Pt observed using PLB techniques particularly while ambulating on the track/stairs  Pt rates her shortness of breath a level 1-2 for dyspnea. Pt weight is elevated to 2.6 kg due in part by her inability to be as active at home because of the restrictions for  her foot.  Pt with reportable readings for CBG well within range of acceptablitity. Pt workloads show average of 18-24 laps around the track/stairs during two 15 stations, nustep level 5.  Continue to monitor pt progress during the next 30 day assessment.    Expected Outcomes  See Admission Goals/Outcomes       ITP Comments: ITP Comments    Row Name 09/26/17 1423 11/07/17 0930 12/05/17 1411  01/02/18 1442     ITP Comments  Dr. Jennet Maduro, Medical Director  Dr. Jennet Maduro, Medical Director  Dr. Jennet Maduro, Medical Director Pulmonary Rehab  Dr. Jennet Maduro, Medical Director Pulmonary Rehab       Comments: Pt has completed 21 exercise session.  Continue to monitor. Cherre Huger, BSN Cardiac and Training and development officer

## 2018-01-03 ENCOUNTER — Encounter (HOSPITAL_COMMUNITY)
Admission: RE | Admit: 2018-01-03 | Discharge: 2018-01-03 | Disposition: A | Discharge status: Home / Self Care | Payer: Medicare Other | Source: Ambulatory Visit | Attending: Internal Medicine | Admitting: Internal Medicine

## 2018-01-03 DIAGNOSIS — I700 Atherosclerosis of aorta: Secondary | ICD-10-CM | POA: Diagnosis not present

## 2018-01-03 DIAGNOSIS — E785 Hyperlipidemia, unspecified: Secondary | ICD-10-CM | POA: Diagnosis not present

## 2018-01-03 DIAGNOSIS — J438 Other emphysema: Secondary | ICD-10-CM | POA: Diagnosis not present

## 2018-01-03 DIAGNOSIS — K219 Gastro-esophageal reflux disease without esophagitis: Secondary | ICD-10-CM | POA: Diagnosis not present

## 2018-01-03 DIAGNOSIS — J439 Emphysema, unspecified: Secondary | ICD-10-CM

## 2018-01-03 DIAGNOSIS — I2510 Atherosclerotic heart disease of native coronary artery without angina pectoris: Secondary | ICD-10-CM | POA: Diagnosis not present

## 2018-01-03 DIAGNOSIS — I10 Essential (primary) hypertension: Secondary | ICD-10-CM | POA: Diagnosis not present

## 2018-01-03 NOTE — Progress Notes (Signed)
Daily Session Note  Patient Details  Name: Karen West MRN: [id number] Date of Birth: 10-24-1940 Referring Provider:     Pulmonary Rehab Walk Test from 10/04/2017 in [place]  Referring Provider  Dr. Chase Caller      Encounter Date: 01/03/2018  Check In: Session Check In - 01/03/18 1057      Check-In   Supervising physician immediately available to respond to emergencies  [facility] immediately available    Physician(s)  Dr. Ree Kida    Location  [facility]    Staff Present  [name], MS, Exercise Physiologist;Lisa [name], [name], RN, MHA;[name], MS, ACSM [name], Exercise Physiologist    Medication changes reported      No    Fall or balance concerns reported     No    Tobacco Cessation  No Change    Warm-up and Cool-down  Performed as group-led instruction    Resistance Training Performed  Yes    VAD Patient?  No    PAD/SET Patient?  No      Pain Assessment   Currently in Pain?  No/denies    Multiple Pain Sites  No       Capillary Blood Glucose: No results found for this or any previous visit (from the past 24 hour(s)).    Social History   Tobacco Use  Smoking Status Never Smoker  Smokeless Tobacco Never Used    Goals Met:  Proper associated with RPD/PD & O2 Sat Exercise tolerated well No report of cardiac concerns or symptoms Strength training completed today  Goals Unmet:  Not Applicable  Comments: Service time is from 1030 to 1205    Dr. Rush Farmer is Medical Director for Pulmonary Rehab at [facility].

## 2018-01-08 ENCOUNTER — Encounter (HOSPITAL_COMMUNITY)
Admission: RE | Admit: 2018-01-08 | Discharge: 2018-01-08 | Disposition: A | Discharge status: Home / Self Care | Payer: Medicare Other | Source: Ambulatory Visit | Attending: Internal Medicine | Admitting: Internal Medicine

## 2018-01-08 DIAGNOSIS — J438 Other emphysema: Secondary | ICD-10-CM | POA: Diagnosis not present

## 2018-01-08 DIAGNOSIS — E785 Hyperlipidemia, unspecified: Secondary | ICD-10-CM | POA: Diagnosis not present

## 2018-01-08 DIAGNOSIS — K219 Gastro-esophageal reflux disease without esophagitis: Secondary | ICD-10-CM | POA: Diagnosis not present

## 2018-01-08 DIAGNOSIS — D519 Vitamin B12 deficiency anemia, unspecified: Secondary | ICD-10-CM | POA: Diagnosis not present

## 2018-01-08 DIAGNOSIS — J439 Emphysema, unspecified: Secondary | ICD-10-CM

## 2018-01-08 DIAGNOSIS — I2510 Atherosclerotic heart disease of native coronary artery without angina pectoris: Secondary | ICD-10-CM | POA: Diagnosis not present

## 2018-01-08 DIAGNOSIS — I10 Essential (primary) hypertension: Secondary | ICD-10-CM | POA: Diagnosis not present

## 2018-01-08 DIAGNOSIS — I700 Atherosclerosis of aorta: Secondary | ICD-10-CM | POA: Diagnosis not present

## 2018-01-08 DIAGNOSIS — E1121 Type 2 diabetes mellitus with diabetic nephropathy: Secondary | ICD-10-CM | POA: Diagnosis not present

## 2018-01-08 DIAGNOSIS — E890 Postprocedural hypothyroidism: Secondary | ICD-10-CM | POA: Diagnosis not present

## 2018-01-08 NOTE — Progress Notes (Signed)
Daily Session Note  Patient Details  Name: Karen West MRN: [id number] Date of Birth: 1941/01/21 Referring Provider:     Pulmonary Rehab Walk Test from 10/04/2017 in [place]  Referring Provider  Dr. Chase Caller      Encounter Date: 01/08/2018  Check In: Session Check In - 01/08/18 1051      Check-In   Supervising physician immediately available to respond to emergencies  [facility] immediately available    Physician(s)  Dr. Ree Kida    Location  [facility]    Staff Present  [name], MS, Exercise Physiologist;[name] [name], [name], RN, MHA;[name], MS, ACSM [name], Exercise Physiologist    Medication changes reported      No    Fall or balance concerns reported     No    Tobacco Cessation  No Change    Warm-up and Cool-down  Performed as group-led instruction    Resistance Training Performed  Yes    VAD Patient?  No    PAD/SET Patient?  No      Pain Assessment   Currently in Pain?  No/denies    Multiple Pain Sites  No       Capillary Blood Glucose: No results found for this or any previous visit (from the past 24 hour(s)).    Social History   Tobacco Use  Smoking Status Never Smoker  Smokeless Tobacco Never Used    Goals Met:  Exercise tolerated well No report of cardiac concerns or symptoms Strength training completed today  Goals Unmet:  Not Applicable  Comments: Service time is from 1030 to 1200    Dr. Rush Farmer is Medical Director for Pulmonary Rehab at [facility].

## 2018-01-10 ENCOUNTER — Encounter (HOSPITAL_COMMUNITY)
Admission: RE | Admit: 2018-01-10 | Discharge: 2018-01-10 | Disposition: A | Discharge status: Home / Self Care | Payer: Medicare Other | Source: Ambulatory Visit | Attending: Internal Medicine | Admitting: Internal Medicine

## 2018-01-10 DIAGNOSIS — I700 Atherosclerosis of aorta: Secondary | ICD-10-CM | POA: Diagnosis not present

## 2018-01-10 DIAGNOSIS — J439 Emphysema, unspecified: Secondary | ICD-10-CM

## 2018-01-10 DIAGNOSIS — Z23 Encounter for immunization: Secondary | ICD-10-CM | POA: Diagnosis not present

## 2018-01-10 DIAGNOSIS — E1121 Type 2 diabetes mellitus with diabetic nephropathy: Secondary | ICD-10-CM | POA: Diagnosis not present

## 2018-01-10 DIAGNOSIS — E785 Hyperlipidemia, unspecified: Secondary | ICD-10-CM | POA: Diagnosis not present

## 2018-01-10 DIAGNOSIS — K219 Gastro-esophageal reflux disease without esophagitis: Secondary | ICD-10-CM | POA: Diagnosis not present

## 2018-01-10 DIAGNOSIS — I10 Essential (primary) hypertension: Secondary | ICD-10-CM | POA: Diagnosis not present

## 2018-01-10 DIAGNOSIS — I2510 Atherosclerotic heart disease of native coronary artery without angina pectoris: Secondary | ICD-10-CM | POA: Diagnosis not present

## 2018-01-10 DIAGNOSIS — J438 Other emphysema: Secondary | ICD-10-CM | POA: Diagnosis not present

## 2018-01-10 NOTE — Progress Notes (Signed)
Daily Session Note  Patient Details  Name: Karen West MRN: [id number] Date of Birth: Nov 03, 1940 Referring Provider:     Pulmonary Rehab Walk Test from 10/04/2017 in [place]  Referring Provider  Dr. Chase Caller      Encounter Date: 01/10/2018  Check In: Session Check In - 01/10/18 1057      Check-In   Supervising physician immediately available to respond to emergencies  [facility] immediately available    Physician(s)  Dr. Tyrell Antonio    Location  [facility]    Staff Present  [name], MS, Exercise Physiologist;Annedrea Rosezella [place], RN, MHA;[name], MS, ACSM [name], Exercise Physiologist;Carlette Wilber Oliphant, RN, Roque Cash, RN    Medication changes reported      No    Fall or balance concerns reported     No    Tobacco Cessation  No Change    Warm-up and Cool-down  Performed as group-led instruction    Resistance Training Performed  Yes    VAD Patient?  No    PAD/SET Patient?  No      Pain Assessment   Currently in Pain?  No/denies    Multiple Pain Sites  No       Capillary Blood Glucose: No results found for this or any previous visit (from the past 24 hour(s)).    Social History   Tobacco Use  Smoking Status Never Smoker  Smokeless Tobacco Never Used    Goals Met:  Proper associated with RPD/PD & O2 Sat Exercise tolerated well  Goals Unmet:  Not Applicable  Comments: Service time is from 1030 to 1230    Dr. Rush Farmer is Medical Director for Pulmonary Rehab at [facility].

## 2018-01-13 ENCOUNTER — Other Ambulatory Visit: Payer: Self-pay | Admitting: Internal Medicine

## 2018-01-15 ENCOUNTER — Encounter (HOSPITAL_COMMUNITY)
Admission: RE | Admit: 2018-01-15 | Discharge: 2018-01-15 | Disposition: A | Discharge status: Home / Self Care | Payer: Medicare Other | Source: Ambulatory Visit | Attending: Internal Medicine | Admitting: Internal Medicine

## 2018-01-15 DIAGNOSIS — I700 Atherosclerosis of aorta: Secondary | ICD-10-CM | POA: Diagnosis not present

## 2018-01-15 DIAGNOSIS — K219 Gastro-esophageal reflux disease without esophagitis: Secondary | ICD-10-CM | POA: Diagnosis not present

## 2018-01-15 DIAGNOSIS — I10 Essential (primary) hypertension: Secondary | ICD-10-CM | POA: Diagnosis not present

## 2018-01-15 DIAGNOSIS — E785 Hyperlipidemia, unspecified: Secondary | ICD-10-CM | POA: Diagnosis not present

## 2018-01-15 DIAGNOSIS — J439 Emphysema, unspecified: Secondary | ICD-10-CM

## 2018-01-15 DIAGNOSIS — I2510 Atherosclerotic heart disease of native coronary artery without angina pectoris: Secondary | ICD-10-CM | POA: Diagnosis not present

## 2018-01-15 DIAGNOSIS — J438 Other emphysema: Secondary | ICD-10-CM | POA: Diagnosis not present

## 2018-01-21 DIAGNOSIS — L821 Other seborrheic keratosis: Secondary | ICD-10-CM | POA: Diagnosis not present

## 2018-01-22 DIAGNOSIS — M064 Inflammatory polyarthropathy: Secondary | ICD-10-CM | POA: Diagnosis not present

## 2018-01-22 DIAGNOSIS — R768 Other specified abnormal immunological findings in serum: Secondary | ICD-10-CM | POA: Diagnosis not present

## 2018-01-22 DIAGNOSIS — M79673 Pain in unspecified foot: Secondary | ICD-10-CM | POA: Diagnosis not present

## 2018-01-22 DIAGNOSIS — M7989 Other specified soft tissue disorders: Secondary | ICD-10-CM | POA: Diagnosis not present

## 2018-01-23 ENCOUNTER — Other Ambulatory Visit: Payer: Self-pay | Admitting: *Deleted

## 2018-01-23 MED ORDER — FERROUS SULFATE 325 (65 FE) MG PO TABS: ORAL_TABLET | ORAL | 0 refills | Status: DC

## 2018-01-25 ENCOUNTER — Inpatient Hospital Stay: Payer: Medicare Other | Attending: Internal Medicine

## 2018-01-25 ENCOUNTER — Ambulatory Visit (HOSPITAL_COMMUNITY)
Admission: RE | Admit: 2018-01-25 | Discharge: 2018-01-25 | Disposition: A | Discharge status: Home / Self Care | Payer: Medicare Other | Source: Ambulatory Visit | Attending: Internal Medicine | Admitting: Internal Medicine

## 2018-01-25 DIAGNOSIS — C3412 Malignant neoplasm of upper lobe, left bronchus or lung: Secondary | ICD-10-CM | POA: Diagnosis not present

## 2018-01-25 DIAGNOSIS — Z79899 Other long term (current) drug therapy: Secondary | ICD-10-CM | POA: Diagnosis not present

## 2018-01-25 DIAGNOSIS — C3490 Malignant neoplasm of unspecified part of unspecified bronchus or lung: Secondary | ICD-10-CM | POA: Diagnosis not present

## 2018-01-25 DIAGNOSIS — I2510 Atherosclerotic heart disease of native coronary artery without angina pectoris: Secondary | ICD-10-CM | POA: Insufficient documentation

## 2018-01-25 DIAGNOSIS — Z7982 Long term (current) use of aspirin: Secondary | ICD-10-CM | POA: Diagnosis not present

## 2018-01-25 DIAGNOSIS — Z853 Personal history of malignant neoplasm of breast: Secondary | ICD-10-CM | POA: Diagnosis not present

## 2018-01-25 DIAGNOSIS — Z7984 Long term (current) use of oral hypoglycemic drugs: Secondary | ICD-10-CM | POA: Diagnosis not present

## 2018-01-25 DIAGNOSIS — Z9013 Acquired absence of bilateral breasts and nipples: Secondary | ICD-10-CM | POA: Diagnosis not present

## 2018-01-25 DIAGNOSIS — I700 Atherosclerosis of aorta: Secondary | ICD-10-CM | POA: Diagnosis not present

## 2018-01-25 DIAGNOSIS — E119 Type 2 diabetes mellitus without complications: Secondary | ICD-10-CM | POA: Diagnosis not present

## 2018-01-25 DIAGNOSIS — I10 Essential (primary) hypertension: Secondary | ICD-10-CM | POA: Insufficient documentation

## 2018-01-25 DIAGNOSIS — Z902 Acquired absence of lung [part of]: Secondary | ICD-10-CM | POA: Insufficient documentation

## 2018-01-25 DIAGNOSIS — Z85118 Personal history of other malignant neoplasm of bronchus and lung: Secondary | ICD-10-CM | POA: Diagnosis not present

## 2018-01-25 LAB — CBC WITH DIFFERENTIAL (CANCER CENTER ONLY)
Abs Immature Granulocytes: 0.03 10*3/uL (ref 0.00–0.07)
Basophils Absolute: 0 10*3/uL (ref 0.0–0.1)
Basophils Relative: 0 %
Eosinophils Absolute: 0.1 10*3/uL (ref 0.0–0.5)
Eosinophils Relative: 1 %
HCT: 30.8 % — ABNORMAL LOW (ref 36.0–46.0)
Hemoglobin: 9.9 g/dL — ABNORMAL LOW (ref 12.0–15.0)
Immature Granulocytes: 0 %
Lymphocytes Relative: 28 %
Lymphs Abs: 1.9 10*3/uL (ref 0.7–4.0)
MCH: 28 pg (ref 26.0–34.0)
MCHC: 32.1 g/dL (ref 30.0–36.0)
MCV: 87 fL (ref 80.0–100.0)
Monocytes Absolute: 0.7 10*3/uL (ref 0.1–1.0)
Monocytes Relative: 10 %
Neutro Abs: 4.2 10*3/uL (ref 1.7–7.7)
Neutrophils Relative %: 61 %
Platelet Count: 322 10*3/uL (ref 150–400)
RBC: 3.54 MIL/uL — ABNORMAL LOW (ref 3.87–5.11)
RDW: 15.8 % — ABNORMAL HIGH (ref 11.5–15.5)
WBC Count: 7 10*3/uL (ref 4.0–10.5)
nRBC: 0 % (ref 0.0–0.2)

## 2018-01-25 LAB — CMP (CANCER CENTER ONLY)
ALT: 16 U/L (ref 0–44)
AST: 15 U/L (ref 15–41)
Albumin: 3.7 g/dL (ref 3.5–5.0)
Alkaline Phosphatase: 57 U/L (ref 38–126)
Anion gap: 10 (ref 5–15)
BUN: 26 mg/dL — ABNORMAL HIGH (ref 8–23)
CO2: 27 mmol/L (ref 22–32)
Calcium: 8.9 mg/dL (ref 8.9–10.3)
Chloride: 104 mmol/L (ref 98–111)
Creatinine: 1.09 mg/dL — ABNORMAL HIGH (ref 0.44–1.00)
GFR, Est AFR Am: 56 mL/min — ABNORMAL LOW (ref >60)
GFR, Estimated: 48 mL/min — ABNORMAL LOW (ref >60)
Glucose, Bld: 83 mg/dL (ref 70–99)
Potassium: 3.8 mmol/L (ref 3.5–5.1)
Sodium: 141 mmol/L (ref 135–145)
Total Bilirubin: 0.3 mg/dL (ref 0.3–1.2)
Total Protein: 7.2 g/dL (ref 6.5–8.1)

## 2018-01-25 MED ORDER — SODIUM CHLORIDE (PF) 0.9 % IJ SOLN
INTRAMUSCULAR | Status: AC
  Filled 2018-01-25: qty 50

## 2018-01-25 MED ORDER — IOHEXOL 300 MG/ML  SOLN
75.0000 mL | Freq: Once | INTRAMUSCULAR | Status: AC | PRN
  Administered 2018-01-25: 75 mL via INTRAVENOUS

## 2018-01-28 ENCOUNTER — Inpatient Hospital Stay (HOSPITAL_BASED_OUTPATIENT_CLINIC_OR_DEPARTMENT_OTHER): Payer: Medicare Other | Admitting: Internal Medicine

## 2018-01-28 ENCOUNTER — Telehealth: Payer: Self-pay

## 2018-01-28 ENCOUNTER — Encounter: Payer: Self-pay | Admitting: Internal Medicine

## 2018-01-28 VITALS — BP 161/63 | HR 90 | Temp 97.6°F | Resp 17 | Ht 61.0 in | Wt 151.0 lb

## 2018-01-28 DIAGNOSIS — Z902 Acquired absence of lung [part of]: Secondary | ICD-10-CM | POA: Diagnosis not present

## 2018-01-28 DIAGNOSIS — Z79899 Other long term (current) drug therapy: Secondary | ICD-10-CM | POA: Diagnosis not present

## 2018-01-28 DIAGNOSIS — Z7982 Long term (current) use of aspirin: Secondary | ICD-10-CM | POA: Diagnosis not present

## 2018-01-28 DIAGNOSIS — Z853 Personal history of malignant neoplasm of breast: Secondary | ICD-10-CM

## 2018-01-28 DIAGNOSIS — C3412 Malignant neoplasm of upper lobe, left bronchus or lung: Secondary | ICD-10-CM | POA: Diagnosis not present

## 2018-01-28 DIAGNOSIS — Z9013 Acquired absence of bilateral breasts and nipples: Secondary | ICD-10-CM

## 2018-01-28 DIAGNOSIS — C3490 Malignant neoplasm of unspecified part of unspecified bronchus or lung: Secondary | ICD-10-CM

## 2018-01-28 DIAGNOSIS — E119 Type 2 diabetes mellitus without complications: Secondary | ICD-10-CM

## 2018-01-28 DIAGNOSIS — Z7984 Long term (current) use of oral hypoglycemic drugs: Secondary | ICD-10-CM

## 2018-01-28 DIAGNOSIS — I10 Essential (primary) hypertension: Secondary | ICD-10-CM

## 2018-01-28 DIAGNOSIS — C3492 Malignant neoplasm of unspecified part of left bronchus or lung: Secondary | ICD-10-CM

## 2018-01-28 NOTE — Progress Notes (Signed)
[place] [phone]   Fax:([phone]) [place], MD [address] [place] [address]  DIAGNOSIS:  stage IA (T1 a, N0, M0) non-small cell lung cancer, adenocarcinoma presented with left upper lobe lung nodule.  PRIOR THERAPY: status post left upper lobectomy with lymph node dissection under the care of Dr. Roxan Hockey on June 04, 2017.  CURRENT THERAPY: Observation.  INTERVAL HISTORY: Karen West 77 y.o. female returns to the clinic today for six-month follow-up visit.  The patient is feeling fine today with no specific complaints except for mild cough.  She denied having any current chest pain, shortness of breath or hemoptysis.  She denied having any recent weight loss or night sweats.  She has no nausea, vomiting, diarrhea or constipation.  She had repeat CT scan of the chest performed recently and she is here for evaluation and discussion of her risk her results.  MEDICAL HISTORY: Past Medical History:  Diagnosis Date  . Atherosclerosis of aorta ([place])    a. 01/2017/03/2017 - noted on high res chest CTs.  . Breast cancer ([facility])    a. Bilateral --> s/p left mastectomy  . Carotid artery disease ([facility])    a. 08/452 w/ 09-81% LICA stenosis and <19% RICA stenosis; b. 10/2015 Carotid U/S: < 50% BICA stenosis  . Chest pain    a. 09/2011 MV: EF 68%, no ischemia/infarct.  . Chronic anemia   . Chronic headaches    denies  . Coronary artery calcification seen on CT scan    a. 01/2017 High res CT: atherosclerotic calcification of the arterial vascularture, including severe involvement of the coronary arteries; b. 03/2017 CT Chest: coronary and Ao atheroscelrosis.  Marland Kitchen GERD (gastroesophageal reflux disease)   . History of echocardiogram    a. 09/2011 Echo: EF 55-60%, no rwma, triv AI, PASP 70mmHg.  Marland Kitchen Hyperlipidemia   . Hypertension   . Hyperthyroidism   . Left upper lobe pulmonary nodule    a. 02/2017  PET: slowing enlarging 1.7cm LUL nodule w/ low-grade metabolic activity; b. 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 05/2017 s/p VATS.  . Multinodular goiter    a. 02/2017 PET scan- Hypermetabolic nodule;  b. 11/5619 s/p thyroidectomy  . Obesity   . OSA on CPAP    cpap  . Osteoarthritis   . Personal history of radiation therapy 1999  . Right bundle branch block   . 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  MEDICATIONS:  Current Outpatient Medications  Medication Sig Dispense Refill  . acetaminophen (TYLENOL) 500 MG tablet Take 1,000 mg by mouth every 6 (six) hours as needed for moderate pain or headache.    Marland Kitchen amLODipine (NORVASC) 10 MG tablet Take 10 mg by mouth daily.    Marland Kitchen aspirin 81 MG tablet Take 81 mg by mouth daily.      Marland Kitchen ezetimibe (ZETIA) 10 MG tablet Take 5 mg by mouth daily.     . ferrous sulfate 325 (65 FE) MG tablet TAKE 1 TABLET (325 MG TOTAL) BY MOUTH 3 (THREE) TIMES DAILY WITH MEALS. 270 tablet 0  . glipiZIDE (GLUCOTROL) 10 MG 24 hr tablet Take 10 mg by mouth 2 (two) times daily.      . hydrocortisone cream 1 % Apply 1 application topically 2 (two) times daily.    Marland Kitchen levothyroxine (SYNTHROID, LEVOTHROID)  88 MCG tablet Take 88 mcg by mouth daily.  5  . Magnesium Oxide 200 MG TABS Take 1 tablet by mouth daily.    . metFORMIN (GLUCOPHAGE) 1000 MG tablet Take 1,000 mg by mouth 2 (two) times daily.     . Omega-3 Fatty Acids (FISH OIL) 1200 MG CAPS Take 1,200 mg by mouth 2 (two) times daily.     Marland Kitchen omeprazole (PRILOSEC) 20 MG capsule Take 20 mg by mouth 2 (two) times daily. Takes prn    . rosuvastatin (CRESTOR) 20 MG tablet Take 20 mg by mouth daily.     No current facility-administered medications for this visit.     SURGICAL HISTORY:  Past Surgical History:  Procedure Laterality Date  . BREAST BIOPSY  1993; 1995; 2000   left; right; left    . BREAST EXCISIONAL BIOPSY Right pt unsure  . MASTECTOMY Left 2000   left  . THYROIDECTOMY  05/10/2017   VIDEO BRONCHOSCOPY WITH ENDOBRONCHIAL NAVIGATION (N/A)  . THYROIDECTOMY N/A 05/10/2017   Procedure: TOTAL THYROIDECTOMY;  Surgeon: Armandina Gemma, MD;  Location: [facility];  Service: General;  Laterality: N/A;  . VIDEO ASSISTED THORACOSCOPY (VATS)/ LOBECTOMY Left 06/04/2017   Procedure: VIDEO ASSISTED THORACOSCOPY (VATS)/LEFT UPPER LOBECTOMY;  Surgeon: [name], MD;  Location: [facility];  Service: Thoracic;  Laterality: Left;  Marland Kitchen VIDEO BRONCHOSCOPY WITH ENDOBRONCHIAL NAVIGATION N/A 05/10/2017   Procedure: VIDEO BRONCHOSCOPY WITH ENDOBRONCHIAL NAVIGATION;  Surgeon: [name], MD;  Location: [facility];  Service: Thoracic;  Laterality: N/A;  . VIDEO BRONCHOSCOPY WITH ENDOBRONCHIAL ULTRASOUND N/A 06/04/2017   Procedure: VIDEO BRONCHOSCOPY WITH ENDOBRONCHIAL ULTRASOUND;  Surgeon: [name], MD;  Location: [facility];  Service: Thoracic;  Laterality: N/A;    REVIEW OF SYSTEMS:  A comprehensive review of systems was negative except for: Respiratory: positive for cough   PHYSICAL EXAMINATION: General appearance: alert, cooperative, fatigued and no distress Head: Normocephalic, without obvious abnormality, atraumatic Neck: no adenopathy, no JVD, supple, symmetrical, trachea midline and thyroid not enlarged, symmetric, no tenderness/mass/nodules Lymph nodes: Cervical, supraclavicular, and axillary nodes normal. Resp: clear to auscultation bilaterally Back: symmetric, no curvature. ROM normal. No CVA tenderness. Cardio: regular rate and rhythm, S1, S2 normal, no murmur, click, rub or gallop GI: soft, non-tender; bowel sounds normal; no masses,  no organomegaly Extremities: extremities normal, atraumatic, no cyanosis or edema  ECOG PERFORMANCE STATUS: 1 - Symptomatic but completely ambulatory  Blood pressure (!) 161/63, pulse 90, temperature 97.6 F (36.4 C), temperature source  Oral, resp. rate 17, height 5\' 1"  (1.549 m), weight 151 lb (68.5 kg), SpO2 98 %.  LABORATORY DATA: Lab Results  Component Value Date   WBC 7.0 01/25/2018   HGB 9.9 (L) 01/25/2018   HCT 30.8 (L) 01/25/2018   MCV 87.0 01/25/2018   PLT 322 01/25/2018      Chemistry      Component Value Date/Time   NA 141 01/25/2018 0904   K 3.8 01/25/2018 0904   CL 104 01/25/2018 0904   CO2 27 01/25/2018 0904   BUN 26 (H) 01/25/2018 0904   CREATININE 1.09 (H) 01/25/2018 0904   CREATININE 1.16 (H) 09/11/2017 1241      Component Value Date/Time   CALCIUM 8.9 01/25/2018 0904   ALKPHOS 57 01/25/2018 0904   AST 15 01/25/2018 0904   ALT 16 01/25/2018 0904   BILITOT 0.3 01/25/2018 0904       RADIOGRAPHIC STUDIES: Ct Chest W Contrast  Result Date: 01/25/2018 CLINICAL DATA:  Non-small-cell left lung  cancer diagnosed in December 2018 status post left upper lobectomy 06/04/2017. Remote bilateral breast cancer. EXAM: CT CHEST WITH CONTRAST TECHNIQUE: Multidetector CT imaging of the chest was performed during intravenous contrast administration. CONTRAST:  69mL OMNIPAQUE IOHEXOL 300 MG/ML  SOLN COMPARISON:  Chest CT 10/30/2017 and 04/10/2017. Abdominal CT with contrast 04/07/2013. PET-CT 02/28/2017. FINDINGS: Cardiovascular: There is atherosclerosis of the aorta, great vessels and coronary arteries. There are probable aortic valvular calcifications as well as dense mitral annular calcifications. The heart size is normal. There is no pericardial effusion. Mediastinum/Nodes: There are no residual enlarged mediastinal, hilar, axillary or internal mammary lymph nodes. Small mediastinal lymph nodes are stable, including a 9 mm pretracheal node on image 30/2. There are postsurgical changes in both axilla consistent with axillary node dissection.Probable thyroidectomy. The trachea and esophagus appear unremarkable. Lungs/Pleura: There is no pleural effusion or pneumothorax. Patient is status post left upper lobectomy.  There is stable mild scarring at both lung bases. No pulmonary nodule or endobronchial lesion demonstrated. Upper abdomen: The visualized liver and spleen appear unremarkable. There is an 11 mm cystic lesion within the uncinate process of the pancreas (image 129/2) which is unchanged from previous studies. There is a small lesion in the pancreatic tail, measuring 9 mm on image 104/2, also stable. There are intermediate density renal lesions bilaterally, measuring up to 2.4 cm on the left (image 128/2. Although enlarged from 2015 CT, these appear unchanged from the PET-CT of 11 months ago and did not show hypermetabolic activity at that time, consistent with hemorrhagic cysts. There was no hypermetabolic activity within any of these abdominal findings on the PET-CT. Musculoskeletal/Chest wall: Bilateral mastectomy without chest wall recurrence. No acute or suspicious osseous findings. There are healing thoracotomy defects on the left. IMPRESSION: 1. No evidence of local recurrence or metastatic disease. Previously demonstrated small mediastinal lymph nodes have improved. 2. Several incidental findings in the upper abdomen appear stable, without hypermetabolic activity on prior PET-CT. These include bilateral renal hemorrhagic cysts and incidental pancreatic lesions. Attention on follow-up recommended. 3. Coronary and Aortic Atherosclerosis (ICD10-I70.0). Electronically Signed   By: Richardean Sale M.D.   On: 01/25/2018 16:56    ASSESSMENT AND PLAN: This is a very pleasant 77 years old African-American female with a stage Ia non-small cell lung cancer, adenocarcinoma status post left upper lobectomy with lymph node dissection in March 2019 under the care of Dr. Roxan Hockey. The patient is currently on observation and she is feeling fine. She had repeat CT scan of the chest performed recently.  I personally and independently reviewed the scan and discussed the results with the patient. Her scan showed no  concerning finding for disease recurrence or progression. I recommended for the patient to continue on observation with repeat CT scan of the chest in 6 months. The patient was advised to call immediately if she has any concerning symptoms in the interval. The patient voices understanding of current disease status and treatment options and is in agreement with the current care plan.  All questions were answered. The patient knows to call the clinic with any problems, questions or concerns. We can certainly see the patient much sooner if necessary.  I spent 10 minutes counseling the patient face to face. The total time spent in the appointment was 15 minutes.  Disclaimer: This note was dictated with voice recognition software. Similar sounding words can inadvertently be transcribed and may not be corrected upon review.

## 2018-01-28 NOTE — Telephone Encounter (Signed)
Printed avs and calender of upcoming appointment. Per 11/11 los

## 2018-01-29 DIAGNOSIS — M064 Inflammatory polyarthropathy: Secondary | ICD-10-CM | POA: Diagnosis not present

## 2018-01-29 DIAGNOSIS — M7989 Other specified soft tissue disorders: Secondary | ICD-10-CM | POA: Diagnosis not present

## 2018-01-29 DIAGNOSIS — R768 Other specified abnormal immunological findings in serum: Secondary | ICD-10-CM | POA: Diagnosis not present

## 2018-01-29 DIAGNOSIS — M79673 Pain in unspecified foot: Secondary | ICD-10-CM | POA: Diagnosis not present

## 2018-02-15 NOTE — Addendum Note (Signed)
Encounter addended by: Ivonne Andrew, RD on: 02/15/2018 6:05 PM  Actions taken: Flowsheet data copied forward, Visit Navigator Flowsheet section accepted

## 2018-02-22 ENCOUNTER — Ambulatory Visit (INDEPENDENT_AMBULATORY_CARE_PROVIDER_SITE_OTHER): Payer: Medicare Other | Admitting: Podiatry

## 2018-02-22 DIAGNOSIS — M79674 Pain in right toe(s): Secondary | ICD-10-CM

## 2018-02-22 DIAGNOSIS — B351 Tinea unguium: Secondary | ICD-10-CM

## 2018-02-22 DIAGNOSIS — M79675 Pain in left toe(s): Secondary | ICD-10-CM | POA: Diagnosis not present

## 2018-02-22 DIAGNOSIS — E119 Type 2 diabetes mellitus without complications: Secondary | ICD-10-CM

## 2018-02-22 NOTE — Patient Instructions (Signed)

## 2018-02-28 DIAGNOSIS — M064 Inflammatory polyarthropathy: Secondary | ICD-10-CM | POA: Diagnosis not present

## 2018-02-28 DIAGNOSIS — M79673 Pain in unspecified foot: Secondary | ICD-10-CM | POA: Diagnosis not present

## 2018-02-28 DIAGNOSIS — R768 Other specified abnormal immunological findings in serum: Secondary | ICD-10-CM | POA: Diagnosis not present

## 2018-02-28 DIAGNOSIS — M7989 Other specified soft tissue disorders: Secondary | ICD-10-CM | POA: Diagnosis not present

## 2018-03-14 DIAGNOSIS — C50919 Malignant neoplasm of unspecified site of unspecified female breast: Secondary | ICD-10-CM | POA: Diagnosis not present

## 2018-03-14 DIAGNOSIS — M8580 Other specified disorders of bone density and structure, unspecified site: Secondary | ICD-10-CM | POA: Diagnosis not present

## 2018-03-14 DIAGNOSIS — C3490 Malignant neoplasm of unspecified part of unspecified bronchus or lung: Secondary | ICD-10-CM | POA: Diagnosis not present

## 2018-03-14 DIAGNOSIS — N8189 Other female genital prolapse: Secondary | ICD-10-CM | POA: Diagnosis not present

## 2018-03-14 DIAGNOSIS — Z6825 Body mass index (BMI) 25.0-25.9, adult: Secondary | ICD-10-CM | POA: Diagnosis not present

## 2018-03-17 NOTE — Addendum Note (Signed)
Encounter addended by: Rowe Pavy, RN on: 03/17/2018 8:31 PM  Actions taken: Episode resolved, Flowsheet data copied forward, Visit Navigator Flowsheet section accepted, Clinical Note Signed

## 2018-03-17 NOTE — Progress Notes (Signed)
Discharge Progress Report  Patient Details  Name: Karen West MRN: [id number] Date of Birth: 08-Jun-1940 Referring Provider:     Pulmonary Rehab Walk Test from 10/04/2017 in [place]  Referring Provider  Dr. Chase Caller       Number of Visits: 24 exercise session and 12 education classes  Reason for Discharge:  Patient reached a stable level of exercise. Patient independent in their exercise. Patient has met program and personal goals.  Smoking History:  Social History   Tobacco Use  Smoking Status Never Smoker  Smokeless Tobacco Never Used    Diagnosis:  Pulmonary emphysema, unspecified emphysema type ([place])  ADL [facility]: Pulmonary Assessment Scores    Row Name 09/27/17 2022 10/05/17 0845 01/08/18 1308     ADL [facility]   ADL Phase  Entry  Entry  Exit   SOB Score total  42  -  13     CAT Score   CAT Score  pre 5  -  4 post     mMRC Score   mMRC Score  -  0  -   Row Name 01/15/18 1214         ADL [facility]   ADL Phase  Exit       mMRC Score   mMRC Score  2        Initial Exercise Prescription: Initial Exercise Prescription - 10/05/17 0800      Date of Initial Exercise RX and Referring Provider   Date  10/04/17    Referring Provider  Dr. Chase Caller      NuStep   Level  2    SPM  80    Minutes  17    METs  1.5      Arm Ergometer   Level  1    Watts  10    Minutes  17      Track   Laps  8    Minutes  17      Prescription Details   Frequency (times per week)  2    Duration  Progress to 45 minutes of aerobic exercise without signs/symptoms of physical distress      Intensity   THRR 40-80% of Max Heartrate  58-116    Ratings of Perceived Exertion  11-13    Perceived Dyspnea  0-4      Progression   Progression  Continue progressive overload as per policy without signs/symptoms or physical distress.      Resistance Training   Training Prescription  Yes    Weight  orange bands    Reps  10-15       Discharge  Exercise Prescription (Final Exercise Prescription Changes): Exercise Prescription Changes - 01/01/18 1200      Response to Exercise   Blood Pressure (Admit)  124/60    Blood Pressure (Exercise)  132/64    Blood Pressure (Exit)  112/60    Heart Rate (Admit)  83 bpm    Heart Rate (Exercise)  112 bpm    Heart Rate (Exit)  86 bpm    Oxygen Saturation (Admit)  99 %    Oxygen Saturation (Exercise)  97 %    Oxygen Saturation (Exit)  97 %    Rating of Perceived Exertion (Exercise)  11    Perceived Dyspnea (Exercise)  2    Duration  Progress to 45 minutes of aerobic exercise without signs/symptoms of physical distress    Intensity  THRR unchanged  Progression   Progression  Continue to progress workloads to maintain intensity without signs/symptoms of physical distress.      Resistance Training   Training Prescription  Yes    Weight  orange bands    Reps  10-15    Time  10 Minutes      Interval Training   Interval Training  No      NuStep   Level  5    SPM  80    Minutes  17    METs  2.8      Track   Laps  22    Minutes  34       Functional Capacity: [name] Name 10/05/17 0845 01/15/18 1209       6 Minute Walk   Phase  Initial  Discharge    Distance  720 feet  945 feet    Distance Feet Change  -  225 ft    Walk Time  6 minutes  6 minutes    # of Rest Breaks  0  0    MPH  1.36  1.79    METS  2.07  2.38    RPE  11  13    Perceived Dyspnea   2  2    Symptoms  Yes (comment)  No    Comments  used wheelchair due to pain in foot 2/10 pain after ambulating  -    Resting HR  94 bpm  98 bpm    Resting BP  144/60  144/62    Resting Oxygen Saturation   96 %  97 %    Exercise Oxygen Saturation  during 6 min walk  96 %  94 %    Max Ex. HR  130 bpm  133 bpm    Max Ex. BP  144/64  172/66    2 Minute Post BP  -  116/62      Interval HR   1 Minute HR  113  111    2 Minute HR  123  127    3 Minute HR  123  131    4 Minute HR  130  133    5 Minute HR  129   128    6 Minute HR  123  132    2 Minute Post HR  109  91    Interval Heart Rate?  Yes  Yes      Interval Oxygen   Interval Oxygen?  Yes  Yes    Baseline Oxygen Saturation %  96 %  97 %    1 Minute Oxygen Saturation %  97 %  98 %    1 Minute Liters of Oxygen  0 L  0 L    2 Minute Oxygen Saturation %  96 %  96 %    2 Minute Liters of Oxygen  0 L  0 L    3 Minute Oxygen Saturation %  96 %  95 %    3 Minute Liters of Oxygen  0 L  0 L    4 Minute Oxygen Saturation %  96 %  97 %    4 Minute Liters of Oxygen  0 L  0 L    5 Minute Oxygen Saturation %  96 %  95 %    5 Minute Liters of Oxygen  0 L  0 L    6 Minute Oxygen Saturation %  96 %  94 %    6 Minute Liters of Oxygen  0 L  0 L    2 Minute Post Oxygen Saturation %  95 %  100 %    2 Minute Post Liters of Oxygen  0 L  0 L       Psychological, QOL, Others - Outcomes: PHQ 2/9: Depression screen [facility] 2/9 01/15/2018 10/11/2017 09/26/2017  Decreased Interest 0 0 0  Down, Depressed, Hopeless 0 0 0  PHQ - 2 Score 0 0 0  Altered sleeping 1 0 0  Tired, decreased energy '1 1 1  '$ Change in appetite '2 2 3  '$ Feeling bad or failure about yourself  0 0 0  Trouble concentrating 0 0 0  Moving slowly or fidgety/restless 0 1 0  Suicidal thoughts 0 0 0  PHQ-9 Score '4 4 4  '$ Difficult doing work/chores Not difficult at all Not difficult at all Not difficult at all    Quality of Life:   Personal Goals: Goals established at orientation with interventions provided to work toward goal. Personal Goals and Risk Factors at Admission - 09/26/17 1436      Core Components/Risk Factors/Patient Goals on Admission    Weight Management  Weight Maintenance;Yes    Intervention  Weight Management: Provide education and appropriate resources to help participant work on and attain dietary goals.;Weight Management: Develop a combined nutrition and exercise program designed to reach desired caloric intake, while maintaining appropriate intake of nutrient and fiber,  sodium and fats, and appropriate energy expenditure required for the weight goal.    Expected Outcomes  Short Term: Continue to assess and modify interventions until short term weight is achieved;Weight Maintenance: Understanding of the daily nutrition guidelines, which includes 25-35% calories from fat, 7% or less cal from saturated fats, less than '200mg'$  cholesterol, less than 1.5gm of sodium, & 5 or more servings of fruits and vegetables daily;Understanding recommendations for meals to include 15-35% energy as protein, 25-35% energy from fat, 35-60% energy from carbohydrates, less than '200mg'$  of dietary cholesterol, 20-35 gm of total fiber daily;Understanding of distribution of calorie intake throughout the day with the consumption of 4-5 meals/snacks    Improve shortness of breath with ADL's  Yes    Intervention  Provide education, individualized exercise plan and daily activity instruction to help decrease symptoms of SOB with activities of daily living.    Expected Outcomes  Short Term: Improve cardiorespiratory fitness to achieve a reduction of symptoms when performing ADLs;Long Term: Be able to perform more ADLs without symptoms or delay the onset of symptoms    Diabetes  Yes    Intervention  Provide education about signs/symptoms and action to take for hypo/hyperglycemia.;Provide education about proper nutrition, including hydration, and aerobic/resistive exercise prescription along with prescribed medications to achieve blood glucose in normal ranges: Fasting glucose 65-99 mg/dL    Expected Outcomes  Short Term: Participant verbalizes understanding of the signs/symptoms and immediate care of hyper/hypoglycemia, proper foot care and importance of medication, aerobic/resistive exercise and nutrition plan for blood glucose control.;Long Term: Attainment of HbA1C < 7%.        Personal Goals Discharge: Goals and Risk Factor Review    Row Name 11/06/17 1002 11/07/17 0930 12/05/17 1413 01/02/18 1445  01/02/18 1510     Core Components/Risk Factors/Patient Goals Review   Personal Goals Review  Weight Management/Obesity;Improve shortness of breath with ADL's;Increase knowledge of respiratory medications and ability to use respiratory devices properly.;Develop more efficient breathing techniques such as purse lipped breathing and  diaphragmatic breathing and practicing self-pacing with activity.;Diabetes  -  Weight Management/Obesity;Improve shortness of breath with ADL's;Increase knowledge of respiratory medications and ability to use respiratory devices properly.;Develop more efficient breathing techniques such as purse lipped breathing and diaphragmatic breathing and practicing self-pacing with activity.;Diabetes  Weight Management/Obesity;Improve shortness of breath with ADL's;Increase knowledge of respiratory medications and ability to use respiratory devices properly.;Develop more efficient breathing techniques such as purse lipped breathing and diaphragmatic breathing and practicing self-pacing with activity.;Diabetes  (Pended)   Weight Management/Obesity;Improve shortness of breath with ADL's;Increase knowledge of respiratory medications and ability to use respiratory devices properly.;Develop more efficient breathing techniques such as purse lipped breathing and diaphragmatic breathing and practicing self-pacing with activity.;Diabetes   Review  Pt has completed 4 exercise sessions since starting on 7/25 and 1 incomplete due to elevation of CBG 357. Pt has a rheumatolgy consult to determine the cause of her discomfort to her great toe.  Unable to adequately assess pt progress toward pulmnary goals.  I anticipate pt will show pogress toward goals and improved managment of her diabetes  -  Pt has completed 13 exercise sessions.  Pt progress has been hampered with pt issues with the top of her foot.  Pt has slow healing area and is taking prednisone.  Pt given guidelines to observe which involves resting  her foot more. Pt observed using PLB techniques particularly while ambulating on the track/stairs  Pt reates her shortness of breath a level 2 for dypnea. Pt weight is elevated to 1.2 kg due in part by her inability to be as active at home because of the restrictions for her foot.  Pt with reportable readings for CBG well within range of acceptablitity. Pt workloads show average of 18-24 laps around the track during two stations and using the stairs, nustep level 4 .  Continue to monitor pt progress during the next 30 day assessment.  Pt has completed 13 exercise sessions.  Pt progress has been hampered with pt issues with the top of her foot.  Pt has slow healing area and is taking prednisone.  Pt given guidelines to observe which involves resting her foot more. Pt observed using PLB techniques particularly while ambulating on the track/stairs  Pt reates her shortness of breath a level 2 for dypnea. Pt weight is elevated to 1.2 kg due in part by her inability to be as active at home because of the restrictions for her foot.  Pt with reportable readings for CBG well within range of acceptablitity. Pt workloads show average of 18-24 laps around the track during two stations and using the stairs, nustep level 4 .  Continue to monitor pt progress during the next 30 day assessment.  (Pended)   Pt has completed 21exercise sessions.  Pt progress has been hampered with pt issues with the top of her foot.  Pt has slow healing area and is continuing to take prednisone.  Pt given guidelines to observe which involves resting her foot more at home. Pt observed using PLB techniques particularly while ambulating on the track/stairs  Pt rates her shortness of breath a level 1-2 for dyspnea. Pt weight is elevated to 2.6 kg due in part by her inability to be as active at home because of the restrictions for her foot.  Pt with reportable readings for CBG well within range of acceptablitity. Pt workloads show average of 18-24 laps  around the track/stairs during two 15 stations, nustep level 5.  Continue to monitor pt progress during the  next 30 day assessment.   Expected Outcomes  See admission goals  See Admission Goals/Outcomes  See Admission Goals/Outcomes  -  See Admission Goals/Outcomes   Row Name 01/17/18 1135 03/17/18 2020           Core Components/Risk Factors/Patient Goals Review   Review  Pt graduates today and has done well despite having the setback with her foot. Pt feels that overall she is doing better and plans to continue with walking at home.  Pt graduates today with the completion of 24 exercise sessions. She  has done well despite having the setback with her foot. Pt feels that overall she is doing better and plans to continue with walking at home.      Expected Outcomes  Pt in process of achieving pt goals and outcomes.  Pt in process of achieving pt goals and outcomes.         Exercise Goals and Review: Exercise Goals    Row Name 09/26/17 1453             Exercise Goals   Increase Physical Activity  Yes       Intervention  Provide advice, education, support and counseling about physical activity/exercise needs.;Develop an individualized exercise prescription for aerobic and resistive training based on initial evaluation findings, risk stratification, comorbidities and participant's personal goals.       Expected Outcomes  Short Term: Attend rehab on a regular basis to increase amount of physical activity.;Long Term: Add in home exercise to make exercise part of routine and to increase amount of physical activity.;Long Term: Exercising regularly at least 3-5 days a week.       Increase Strength and Stamina  Yes       Intervention  Develop an individualized exercise prescription for aerobic and resistive training based on initial evaluation findings, risk stratification, comorbidities and participant's personal goals.;Provide advice, education, support and counseling about physical  activity/exercise needs.       Expected Outcomes  Short Term: Increase workloads from initial exercise prescription for resistance, speed, and METs.;Short Term: Perform resistance training exercises routinely during rehab and add in resistance training at home;Long Term: Improve cardiorespiratory fitness, muscular endurance and strength as measured by increased METs and functional capacity (6MWT)       Able to understand and use rate of perceived exertion (RPE) scale  Yes       Intervention  Provide education and explanation on how to use RPE scale       Expected Outcomes  Short Term: Able to use RPE daily in rehab to express subjective intensity level;Long Term:  Able to use RPE to guide intensity level when exercising independently       Able to understand and use Dyspnea scale  Yes       Intervention  Provide education and explanation on how to use Dyspnea scale       Expected Outcomes  Short Term: Able to use Dyspnea scale daily in rehab to express subjective sense of shortness of breath during exertion;Long Term: Able to use Dyspnea scale to guide intensity level when exercising independently       Knowledge and understanding of Target Heart Rate Range (THRR)  Yes       Intervention  Provide education and explanation of THRR including how the numbers were predicted and where they are located for reference       Expected Outcomes  Short Term: Able to state/look up THRR;Long Term: Able to use THRR to  govern intensity when exercising independently;Short Term: Able to use daily as guideline for intensity in rehab       Understanding of Exercise Prescription  Yes       Intervention  Provide education, explanation, and written materials on patient's individual exercise prescription       Expected Outcomes  Short Term: Able to explain program exercise prescription;Long Term: Able to explain home exercise prescription to exercise independently          Exercise Goals Re-Evaluation: Exercise Goals  Re-Evaluation    Row Name 11/05/17 0925 12/03/17 1636 01/01/18 1550         Exercise Goal Re-Evaluation   Exercise Goals Review  Increase Physical Activity;Increase Strength and Stamina;Able to understand and use rate of perceived exertion (RPE) scale;Able to understand and use Dyspnea scale;Knowledge and understanding of Target Heart Rate Range (THRR);Understanding of Exercise Prescription  Increase Physical Activity;Increase Strength and Stamina;Able to understand and use rate of perceived exertion (RPE) scale;Able to understand and use Dyspnea scale;Knowledge and understanding of Target Heart Rate Range (THRR);Understanding of Exercise Prescription  Increase Physical Activity;Increase Strength and Stamina;Able to understand and use rate of perceived exertion (RPE) scale;Able to understand and use Dyspnea scale;Knowledge and understanding of Target Heart Rate Range (THRR);Understanding of Exercise Prescription     Comments  Patient has only attended 4 rehab sessions. Will cont. to monitor and progress as able.  Patient is progressing well in program. Tiajuana Amass is her foot pain-- per Dr. I will have to take her off the walking track. MET places her in a moderate level. Will cont to progress and motivate. Will tailor her exercise prescription to her foot pain.   Patient is progressing well in program. Tiajuana Amass is her foot pain-- per Dr. I will have to take her off the walking track. MET places her in a moderate level. Will cont to progress and motivate. Will tailor her exercise prescription to her foot pain.      Expected Outcomes  Through exercise at rehab and at home, the patient will decrease shortness of breath with daily activities and feel confident in carrying out an exercise regime at home.   Through exercise at rehab and at home, the patient will decrease shortness of breath with daily activities and feel confident in carrying out an exercise regime at home.   Through exercise at rehab and at home, the  patient will decrease shortness of breath with daily activities and feel confident in carrying out an exercise regime at home.         Nutrition & Weight - Outcomes:    Nutrition: Nutrition Therapy & Goals - 02/15/18 1804      Nutrition Therapy   Diet  general, healthful      Personal Nutrition Goals   Nutrition Goal  pt will recognize symptions that can interfere with adequate oral intake      Intervention Plan   Intervention  Prescribe, educate and counsel regarding individualized specific dietary modifications aiming towards targeted core components such as weight, hypertension, lipid management, diabetes, heart failure and other comorbidities.    Expected Outcomes  Short Term Goal: Understand basic principles of dietary content, such as calories, fat, sodium, cholesterol and nutrients.       Nutrition Discharge: Nutrition Assessments - 01/11/18 1129      Rate Your Plate Scores   Pre Score  41    Post Score  25       Education Questionnaire Score: Knowledge Questionnaire Score - 01/08/18  1313      Knowledge Questionnaire Score   Post Score  15/18       Goals reviewed with patient. Cherre Huger, BSN Cardiac and Training and development officer

## 2018-03-21 DIAGNOSIS — H04123 Dry eye syndrome of bilateral lacrimal glands: Secondary | ICD-10-CM | POA: Diagnosis not present

## 2018-03-21 DIAGNOSIS — H40013 Open angle with borderline findings, low risk, bilateral: Secondary | ICD-10-CM | POA: Diagnosis not present

## 2018-03-21 DIAGNOSIS — H02839 Dermatochalasis of unspecified eye, unspecified eyelid: Secondary | ICD-10-CM | POA: Diagnosis not present

## 2018-03-21 DIAGNOSIS — H02403 Unspecified ptosis of bilateral eyelids: Secondary | ICD-10-CM | POA: Diagnosis not present

## 2018-03-24 ENCOUNTER — Encounter: Payer: Self-pay | Admitting: Podiatry

## 2018-03-24 NOTE — Progress Notes (Signed)
Subjective: Karen West presents today with painful, thick toenails 1-5 b/l that she cannot cut and which interfere with daily activities.  Pain is aggravated when wearing enclosed shoe gear.  Patient states she is seeing a rheumatologist and has been on prednisone for the past 4 months. She believes this has caused her feet to swell and she has also noticed redness on the bottom of her feet.  Deland Pretty, MD is her PCP.   Current Outpatient Medications:  .  acetaminophen (TYLENOL) 500 MG tablet, Take 1,000 mg by mouth every 6 (six) hours as needed for moderate pain or headache., Disp: , Rfl:  .  amLODipine (NORVASC) 10 MG tablet, Take 10 mg by mouth daily., Disp: , Rfl:  .  aspirin 81 MG tablet, Take 81 mg by mouth daily.  , Disp: , Rfl:  .  ezetimibe (ZETIA) 10 MG tablet, Take 5 mg by mouth daily. , Disp: , Rfl:  .  ferrous sulfate 325 (65 FE) MG tablet, TAKE 1 TABLET (325 MG TOTAL) BY MOUTH 3 (THREE) TIMES DAILY WITH MEALS., Disp: 270 tablet, Rfl: 0 .  glipiZIDE (GLUCOTROL) 10 MG 24 hr tablet, Take 10 mg by mouth 2 (two) times daily.  , Disp: , Rfl:  .  hydrocortisone cream 1 %, Apply 1 application topically 2 (two) times daily., Disp: , Rfl:  .  levothyroxine (SYNTHROID, LEVOTHROID) 88 MCG tablet, Take 88 mcg by mouth daily., Disp: , Rfl: 5 .  Magnesium Oxide 200 MG TABS, Take 1 tablet by mouth daily., Disp: , Rfl:  .  metFORMIN (GLUCOPHAGE) 1000 MG tablet, Take 1,000 mg by mouth 2 (two) times daily. , Disp: , Rfl:  .  Omega-3 Fatty Acids (FISH OIL) 1200 MG CAPS, Take 1,200 mg by mouth 2 (two) times daily. , Disp: , Rfl:  .  omeprazole (PRILOSEC) 20 MG capsule, Take 20 mg by mouth 2 (two) times daily. Takes prn, Disp: , Rfl:  .  predniSONE (DELTASONE) 10 MG tablet, TAKE 3 TABLETS FOR 4 DAYS, THEN DECREASE TO 2 TABS FOR 1 WEEK, THEN DECREASE TO 1 TABLET ONCE A DAY, Disp: , Rfl: 0 .  rosuvastatin (CRESTOR) 20 MG tablet, Take 20 mg by mouth daily., Disp: , Rfl:   Allergies  Allergen  Reactions  . Tramadol Nausea And Vomiting and Other (See Comments)    "got sick to my stomach and was throwing up blood; it put me in the hospital")  . Lisinopril Rash and Other (See Comments)    Renal failure   . Tapazole [Methimazole] Itching and Rash    Objective:  Vascular Examination: Capillary refill time <3 seconds x 10 digits Dorsalis pedis 1/4 b/l Posterior tibial pulses 1/4 b/l Digital hair present x 10 digits Skin temperature gradient WNL b/l Mild edema noted b/l foot/ankles.   Dermatological Examination: Skin with normal turgor, texture and tone b/l  Toenails 1-5 b/l discolored, thick, dystrophic with subungual debris and pain with palpation to nailbeds due to thickness of nails.  No open wounds b/l. No signs of infection noted plantar aspect of both feet.  No interdigital macerations b/l  Musculoskeletal: Muscle strength 5/5 to all LE muscle groups  Neurological: Sensation intact with 10 gram monofilament b/l  Vibratory sensation diminished b/l  Assessment: 1. Painful onychomycosis toenails 1-5 b/l  2. NIDDM  Plan: 1. Continue diabetic foot principles. Literature dispensed. 2. Toenails 1-5 b/l were debrided in length and girth without iatrogenic bleeding. 3. Patient to continue soft, supportive shoe gear 4.  Patient to report any pedal injuries to medical professional immediately. 5. Follow up 3 months. Patient/POA to call should there be a concern in the interim.

## 2018-04-12 DIAGNOSIS — E890 Postprocedural hypothyroidism: Secondary | ICD-10-CM | POA: Diagnosis not present

## 2018-04-12 DIAGNOSIS — R499 Unspecified voice and resonance disorder: Secondary | ICD-10-CM | POA: Diagnosis not present

## 2018-04-15 DIAGNOSIS — E1121 Type 2 diabetes mellitus with diabetic nephropathy: Secondary | ICD-10-CM | POA: Diagnosis not present

## 2018-04-15 DIAGNOSIS — D649 Anemia, unspecified: Secondary | ICD-10-CM | POA: Diagnosis not present

## 2018-04-15 DIAGNOSIS — I10 Essential (primary) hypertension: Secondary | ICD-10-CM | POA: Diagnosis not present

## 2018-04-17 DIAGNOSIS — E890 Postprocedural hypothyroidism: Secondary | ICD-10-CM | POA: Diagnosis not present

## 2018-04-17 DIAGNOSIS — E612 Magnesium deficiency: Secondary | ICD-10-CM | POA: Diagnosis not present

## 2018-04-17 DIAGNOSIS — E785 Hyperlipidemia, unspecified: Secondary | ICD-10-CM | POA: Diagnosis not present

## 2018-04-17 DIAGNOSIS — D519 Vitamin B12 deficiency anemia, unspecified: Secondary | ICD-10-CM | POA: Diagnosis not present

## 2018-04-17 DIAGNOSIS — I10 Essential (primary) hypertension: Secondary | ICD-10-CM | POA: Diagnosis not present

## 2018-04-17 DIAGNOSIS — E038 Other specified hypothyroidism: Secondary | ICD-10-CM | POA: Diagnosis not present

## 2018-04-17 DIAGNOSIS — E119 Type 2 diabetes mellitus without complications: Secondary | ICD-10-CM | POA: Diagnosis not present

## 2018-05-21 DIAGNOSIS — R490 Dysphonia: Secondary | ICD-10-CM | POA: Insufficient documentation

## 2018-05-21 DIAGNOSIS — Z9089 Acquired absence of other organs: Secondary | ICD-10-CM | POA: Diagnosis not present

## 2018-05-27 ENCOUNTER — Encounter: Payer: Self-pay | Admitting: Podiatrist

## 2018-05-27 ENCOUNTER — Ambulatory Visit (INDEPENDENT_AMBULATORY_CARE_PROVIDER_SITE_OTHER): Payer: Medicare Other

## 2018-05-27 ENCOUNTER — Ambulatory Visit (INDEPENDENT_AMBULATORY_CARE_PROVIDER_SITE_OTHER): Payer: Medicare Other | Admitting: Podiatrist

## 2018-05-27 ENCOUNTER — Other Ambulatory Visit: Payer: Self-pay | Admitting: Podiatrist

## 2018-05-27 VITALS — BP 139/73 | HR 81

## 2018-05-27 DIAGNOSIS — M7989 Other specified soft tissue disorders: Secondary | ICD-10-CM

## 2018-05-27 DIAGNOSIS — M79671 Pain in right foot: Secondary | ICD-10-CM

## 2018-05-27 DIAGNOSIS — M19071 Primary osteoarthritis, right ankle and foot: Secondary | ICD-10-CM

## 2018-05-27 DIAGNOSIS — E0840 Diabetes mellitus due to underlying condition with diabetic neuropathy, unspecified: Secondary | ICD-10-CM | POA: Diagnosis not present

## 2018-05-27 NOTE — Patient Instructions (Signed)
I will order you some prescription cream for the pain and swelling from [employer]- they will call you to confirm you would like to try the cream  In the meantime use bengay or aspercreme along with the capsacian cream you are already using

## 2018-05-27 NOTE — Progress Notes (Signed)
DG  °

## 2018-05-27 NOTE — Progress Notes (Signed)
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- [facility] in meantime    Chief Complaint  Patient presents with  . Foot Pain    Right lateral and dorsal forefoot swelling and pain 44mo duration no known injury, pain is "burning"     HPI: Patient is 78 y.o. female who presents today for lateral and dorsal foot pain ans swelling for 9 months duration.  She has a rheumatologist who ran a battery of tests all of which were negative.  She also was on prednisone for 4 months with no relief in symptoms.     Review of Systems  DATA OBTAINED: from patient  GENERAL: Feels well no fevers, no fatigue, no changes in appetite SKIN: No itching, no rashes, no open wounds EYES: No eye pain,no redness, no discharge EARS: No earache,no ringing of ears, NOSE: No congestion, no drainage, no bleeding  MOUTH/THROAT: No mouth pain, No sore throat, No difficulty chewing or swallowing  RESPIRATORY: No cough, no wheezing, no SOB CARDIAC: No chest pain,no heart palpitations, GI: No abdominal pain, No Nausea, no vomiting, no diarrhea, no heartburn or no reflux  GU: No dysuria, no increased frequency or urgency MUSCULOSKELETAL: No unrelieved bone/joint pain,  NEUROLOGIC: Awake, alert, appropriate to situation, No change in mental status. PSYCHIATRIC: No overt anxiety or sadness.No behavior issue.      Physical Exam  GENERAL APPEARANCE: Alert, conversant. Appropriately groomed. No acute distress.   VASCULAR: Pedal pulses palpable DP and PT bilateral.  Capillary refill time is immediate to all digits,  Proximal to distal cooling it warm to warm.  Digital hair growth is present bilateral . Minimal swelling is noted the right foot  NEUROLOGIC: sensation is decreased epicritically and protectively to 5.07 monofilament at 3/5 sites bilateral.  Light touch is absent bilateral, vibratory sensation absent bilateral.   Neuropathy symptoms subjectively elicited as well.  MUSCULOSKELETAL: acceptable muscle strength, tone and stability bilateral.  Intrinsic muscluature intact bilateral.  Pain on palpation right foot lateral cuneiform cuboid region is noted.  DERMATOLOGIC: skin is warm, supple, and dry.  No open lesions noted.  No interdigital maceration noted bilateral.   X-ray evaluation 3 views of the right foot are obtained.  No acute osseous abnormalities are seen.  Normal bone mineralization is noted.  Arthritic changes are seen midfoot right foot.     Assessment   Neuropathy and osteoarthritis right foot  Plan  Discussed treatment options and alternatives.  Recommended compression stockings and neuropathic pain cream.  Surgery grip was dispensed today for her use.  Also discussed adding gabapentin if the pain cream is not enough.  She will call for follow-up.

## 2018-05-28 ENCOUNTER — Telehealth: Payer: Self-pay | Admitting: *Deleted

## 2018-05-28 MED ORDER — NONFORMULARY OR COMPOUNDED ITEM: 5 refills | Status: DC

## 2018-05-28 NOTE — Telephone Encounter (Signed)
-----   Message from Bronson Ing, DPM sent at 05/27/2018  8:26 PM EDT ----- Regarding: [facility] [place] [facility]-  Could you send in a RX for a combo pain, neuropathy, swelling (not sure there is one so if not do one with an antiinflammatory if available)  for this patient to [place] for me?    She has all of the above issues-  THanks!!  kpe

## 2018-05-28 NOTE — Telephone Encounter (Signed)
Dr. Valentina Lucks ordered [facility] Achilles Tendonitis Cream #12 for the antiinflammatory and nerve pain properties. Orders fax

## 2018-06-28 ENCOUNTER — Ambulatory Visit: Payer: Medicare Other | Admitting: Podiatry

## 2018-07-08 ENCOUNTER — Encounter: Payer: Self-pay | Admitting: Podiatry

## 2018-07-08 ENCOUNTER — Other Ambulatory Visit: Payer: Self-pay

## 2018-07-08 ENCOUNTER — Ambulatory Visit (INDEPENDENT_AMBULATORY_CARE_PROVIDER_SITE_OTHER): Payer: Medicare Other | Admitting: Podiatry

## 2018-07-08 VITALS — Temp 97.5°F

## 2018-07-08 DIAGNOSIS — E119 Type 2 diabetes mellitus without complications: Secondary | ICD-10-CM | POA: Diagnosis not present

## 2018-07-08 DIAGNOSIS — B351 Tinea unguium: Secondary | ICD-10-CM | POA: Diagnosis not present

## 2018-07-08 NOTE — Progress Notes (Signed)
Subjective: Karen West presents today with history of diabetes. She relates  neuropathy cream prescribed on last visit has helped her symptoms.    Today, she presents with cc of painful, mycotic toenails.  Pain is aggravated when wearing enclosed shoe gear and relieved with periodic professional debridement.  Deland Pretty, MD is her PCP.   Current Outpatient Medications:  .  acetaminophen (TYLENOL) 500 MG tablet, Take 1,000 mg by mouth every 6 (six) hours as needed for moderate pain or headache., Disp: , Rfl:  .  amLODipine (NORVASC) 10 MG tablet, Take 10 mg by mouth daily., Disp: , Rfl:  .  aspirin 81 MG tablet, Take 81 mg by mouth daily.  , Disp: , Rfl:  .  colchicine 0.6 MG tablet, colchicine 0.6 mg tablet  TAKE 1 TABLET BY MOUTH EVERY DAY*NOT COVERED*, Disp: , Rfl:  .  ezetimibe (ZETIA) 10 MG tablet, Take 5 mg by mouth daily. , Disp: , Rfl:  .  ferrous sulfate 325 (65 FE) MG tablet, TAKE 1 TABLET (325 MG TOTAL) BY MOUTH 3 (THREE) TIMES DAILY WITH MEALS., Disp: 270 tablet, Rfl: 0 .  glipiZIDE (GLUCOTROL) 10 MG 24 hr tablet, Take 10 mg by mouth 2 (two) times daily.  , Disp: , Rfl:  .  hydrocortisone cream 1 %, Apply 1 application topically 2 (two) times daily., Disp: , Rfl:  .  ipratropium (ATROVENT) 0.06 % nasal spray, ipratropium bromide 42 mcg (0.06 %) nasal spray, Disp: , Rfl:  .  levothyroxine (SYNTHROID, LEVOTHROID) 88 MCG tablet, Take 88 mcg by mouth daily., Disp: , Rfl: 5 .  Magnesium Oxide 200 MG TABS, Take 1 tablet by mouth daily., Disp: , Rfl:  .  metFORMIN (GLUCOPHAGE) 1000 MG tablet, Take 1,000 mg by mouth 2 (two) times daily. , Disp: , Rfl:  .  NONFORMULARY OR COMPOUNDED ITEM, [facility]:  Achilles Tendonitis #12 - Diclofenac 3%, Baclofen 2%, Bupivacaine 1%, Gabapentin 6%, Pentoxifylline 3%, Topiramate 1%. Apply 1-2 grams to affected area 3-4 times daily., Disp: 100 each, Rfl: 5 .  Omega-3 Fatty Acids (FISH OIL) 1200 MG CAPS, Take 1,200 mg by mouth 2 (two) times  daily. , Disp: , Rfl:  .  omeprazole (PRILOSEC) 20 MG capsule, Take 20 mg by mouth 2 (two) times daily. Takes prn, Disp: , Rfl:  .  predniSONE (DELTASONE) 10 MG tablet, TAKE 3 TABLETS FOR 4 DAYS, THEN DECREASE TO 2 TABS FOR 1 WEEK, THEN DECREASE TO 1 TABLET ONCE A DAY, Disp: , Rfl: 0 .  rosuvastatin (CRESTOR) 20 MG tablet, Take 20 mg by mouth daily., Disp: , Rfl:  .  triamterene-hydrochlorothiazide (MAXZIDE-25) 37.5-25 MG tablet, triamterene 37.5 mg-hydrochlorothiazide 25 mg tablet, Disp: , Rfl:   Allergies  Allergen Reactions  . Tramadol Nausea And Vomiting and Other (See Comments)    "got sick to my stomach and was throwing up blood; it put me in the hospital")  . Lisinopril Rash and Other (See Comments)    Renal failure   . Tapazole [Methimazole] Itching and Rash    Objective:  Vascular Examination: Capillary refill time <3 seconds x 10 digits.  Dorsalis pedis and Posterior tibial pulses 1/4 b/l.  Digital hair x 10 digits was present.  Skin temperature gradient WNL b/l.  Dermatological Examination: Skin with normal turgor, texture and tone b/l.  Toenails 1-5 b/l discolored, thick, dystrophic with subungual debris and pain with palpation to nailbeds due to thickness of nails.  Musculoskeletal: Muscle strength 5/5 to all muscle groups b/l.  Neurological: Sensation with 10 gram monofilament is intact b/l.  Vibratory sensation diminished b/l.  Assessment: 1. Painful onychomycosis toenails 1-5 b/l 2. NIDDM  Plan: 1. Toenails 1-5 b/l were debrided in length and girth without iatrogenic bleeding. 2. Patient to continue soft, supportive shoe gear 3. Patient to report any pedal injuries to medical professional  4. Follow up 3 months.  5. Patient/POA to call should there be a concern in the interim.

## 2018-07-08 NOTE — Patient Instructions (Addendum)
Diabetic Neuropathy Diabetic neuropathy refers to nerve damage that is caused by diabetes (diabetes mellitus). Over time, people with diabetes can develop nerve damage throughout the body. There are several types of diabetic neuropathy:  Peripheral neuropathy. This is the most common type of diabetic neuropathy. It causes damage to nerves that carry signals between the spinal cord and other parts of the body (peripheral nerves). This usually affects nerves in the feet and legs first, and may eventually affect the hands and arms. The damage affects the ability to sense touch or temperature.  Autonomic neuropathy. This type causes damage to nerves that control involuntary functions (autonomic nerves). These nerves carry signals that control: ? Heartbeat. ? Body temperature. ? Blood pressure. ? Urination. ? Digestion. ? Sweating. ? Sexual function. ? Response to changing blood sugar (glucose) levels.  Focal neuropathy. This type of nerve damage affects one area of the body, such as an arm, a leg, or the face. The injury may involve one nerve or a small group of nerves. Focal neuropathy can be painful and unpredictable, and occurs most often in older adults with diabetes. This often develops suddenly, but usually improves over time and does not cause long-term problems.  Proximal neuropathy. This type of nerve damage affects the nerves of the thighs, hips, buttocks, or legs. It causes severe pain, weakness, and muscle death (atrophy), usually in the thigh muscles. It is more common among older men and people who have type 2 diabetes. The length of recovery time may vary. What are the causes? Peripheral, autonomic, and focal neuropathies are caused by diabetes that is not well controlled with treatment. The cause of proximal neuropathy is not known, but it may be caused by inflammation related to uncontrolled blood glucose levels. What are the signs or symptoms? Peripheral neuropathy Peripheral  neuropathy develops slowly over time. When the nerves of the feet and legs no longer work, you may experience:  Burning, stabbing, or aching pain in the legs or feet.  Pain or cramping in the legs or feet.  Loss of feeling (numbness) and inability to feel pressure or pain in the feet. This can lead to: ? Thick calluses or sores on areas of constant pressure. ? Ulcers. ? Reduced ability to feel temperature changes.  Foot deformities.  Muscle weakness.  Loss of balance or coordination. Autonomic neuropathy The symptoms of autonomic neuropathy vary depending on which nerves are affected. Symptoms may include:  Problems with digestion, such as: ? Nausea or vomiting. ? Poor appetite. ? Bloating. ? Diarrhea or constipation. ? Trouble swallowing. ? Losing weight without trying to.  Problems with the heart, blood and lungs, such as: ? Dizziness, especially when standing up. ? Fainting. ? Shortness of breath. ? Irregular heartbeat.  Bladder problems, such as: ? Trouble starting or stopping urination. ? Leaking urine. ? Trouble emptying the bladder. ? Urinary tract infections (UTIs).  Problems with other body functions, such as: ? Sweat. You may sweat too much or too little. ? Temperature. You might get hot easily. Or, you might feel cold more than usual. ? Sexual function. Men may not be able to get or maintain an erection. Women may have vaginal dryness and difficulty with arousal. Focal neuropathy Symptoms affect only one area of the body. Common symptoms include:  Numbness.  Tingling.  Burning pain.  Prickling feeling.  Very sensitive skin.  Weakness.  Inability to move (paralysis).  Muscle twitching.  Muscles getting smaller (wasting).  Poor coordination.  Double or blurred vision. Proximal  neuropathy  Sudden, severe pain in the hip, thigh, or buttocks. Pain may spread from the back into the legs (sciatica).  Pain and numbness in the arms and  legs.  Tingling.  Loss of bladder control or bowel control.  Weakness and wasting of thigh muscles.  Difficulty getting up from a seated position.  Abdominal swelling.  Unexplained weight loss. How is this diagnosed? Diagnosis usually involves reviewing your medical history and any symptoms you have. Diagnosis varies depending on the type of neuropathy your health care provider suspects. Peripheral neuropathy Your health care provider will check areas that are affected by your nervous system (neurologic exam), such as your reflexes, how you move, and what you can feel. You may have other tests, such as:  Blood tests.  Removal and examination of fluid that surrounds the spinal cord (lumbar puncture).  CT scan.  MRI.  A test to check the nerves that control muscles (electromyogram, EMG).  Tests of how quickly messages pass through your nerves (nerve conduction velocity tests).  Removal of a small piece of nerve to be examined under a microscope (biopsy). Autonomic neuropathy You may have tests, such as:  Tests to measure your blood pressure and heart rate. This may include monitoring you while you are safely secured to an exam table that moves you from a lying position to an upright position (table tilt test).  Breathing tests to check your lungs.  Tests to check how food moves through the digestive system (gastric emptying tests).  Blood, sweat, or urine tests.  Ultrasound of your bladder.  Spinal fluid tests. Focal neuropathy This condition may be diagnosed with:  A neurologic exam.  CT scan.  MRI.  EMG.  Nerve conduction velocity tests. Proximal neuropathy There is no test to diagnose this type of neuropathy. You may have tests to rule out other possible causes of this type of neuropathy. Tests may include:  X-rays of your spine and lumbar region.  Lumbar puncture.  MRI. How is this treated? The goal of treatment is to keep nerve damage from getting  worse. The most important part of treatment is keeping your blood glucose level and your A1C level within your target range by following your diabetes management plan. Over time, maintaining lower blood glucose levels helps lessen symptoms. In some cases, you may need prescription pain medicine. Follow these instructions at home:  Lifestyle   Do not use any products that contain nicotine or tobacco, such as cigarettes and e-cigarettes. If you need help quitting, ask your health care provider.  Be physically active every day. Include strength training and balance exercises.  Follow a healthy meal plan.  Work with your health care provider to manage your blood pressure. General instructions  Follow your diabetes management plan as directed. ? Check your blood glucose levels as directed by your health care provider. ? Keep your blood glucose in your target range as directed by your health care provider. ? Have your A1C level checked at least two times a year, or as often as told by your health care provider.  Take over the counter and prescription medicines only as told by your health care provider. This includes insulin and diabetes medicine.  Do not drive or use heavy machinery while taking prescription pain medicines.  Check your skin and feet every day for cuts, bruises, redness, blisters, or sores.  Keep all follow up visits as told by your health care provider. This is important. Contact a health care provider if:  You have burning, stabbing, or aching pain in your legs or feet.  You are unable to feel pressure or pain in your feet.  You develop problems with digestion, such as: ? Nausea. ? Vomiting. ? Bloating. ? Constipation. ? Diarrhea. ? Abdominal pain.  You have difficulty with urination, such as inability: ? To control when you urinate (incontinence). ? To completely empty the bladder (retention).  You have palpitations.  You feel dizzy, weak, or faint when you  stand up. Get help right away if:  You cannot urinate.  You have sudden weakness or loss of coordination.  You have trouble speaking.  You have pain or pressure in your chest.  You have an irregular heart beat.  You have sudden inability to move a part of your body. Summary  Diabetic neuropathy refers to nerve damage that is caused by diabetes. It can affect nerves throughout the entire body, causing numbness and pain in the arms, legs, digestive tract, heart, and other body systems.  Keep your blood glucose level and your blood pressure in your target range, as directed by your health care provider. This can help prevent neuropathy from getting worse.  Check your skin and feet every day for cuts, bruises, redness, blisters, or sores.  Do not use any products that contain nicotine or tobacco, such as cigarettes and e-cigarettes. If you need help quitting, ask your health care provider. This information is not intended to replace advice given to you by your health care provider. Make sure you discuss any questions you have with your health care provider. Document Released: 05/15/2001 Document Revised: 04/18/2017 Document Reviewed: 04/10/2016 Elsevier Interactive Patient Education  2019 [employer]   Diabetes Mellitus and [facility] care is an important part of your health, especially when you have diabetes. Diabetes may cause you to have problems because of poor blood flow (circulation) to your feet and legs, which can cause your skin to:  Become thinner and drier.  Break more easily.  Heal more slowly.  Peel and crack. You may also have nerve damage (neuropathy) in your legs and feet, causing decreased feeling in them. This means that you may not notice minor injuries to your feet that could lead to more serious problems. Noticing and addressing any potential problems early is the best way to prevent future foot problems. How to care for your feet Foot hygiene  Wash  your feet daily with warm water and mild soap. Do not use hot water. Then, pat your feet and the areas between your toes until they are completely dry. Do not soak your feet as this can dry your skin.  Trim your toenails straight across. Do not dig under them or around the cuticle. File the edges of your nails with an emery board or nail file.  Apply a moisturizing lotion or petroleum jelly to the skin on your feet and to dry, brittle toenails. Use lotion that does not contain alcohol and is unscented. Do not apply lotion between your toes. Shoes and socks  Wear clean socks or stockings every day. Make sure they are not too tight. Do not wear knee-high stockings since they may decrease blood flow to your legs.  Wear shoes that fit properly and have enough cushioning. Always look in your shoes before you put them on to be sure there are no objects inside.  To break in new shoes, wear them for just a few hours a day. This prevents injuries on your feet. Wounds, scrapes, corns,  and calluses  Check your feet daily for blisters, cuts, bruises, sores, and redness. If you cannot see the bottom of your feet, use a mirror or ask someone for help.  Do not cut corns or calluses or try to remove them with medicine.  If you find a minor scrape, cut, or break in the skin on your feet, keep it and the skin around it clean and dry. You may clean these areas with mild soap and water. Do not clean the area with peroxide, alcohol, or iodine.  If you have a wound, scrape, corn, or callus on your foot, look at it several times a day to make sure it is healing and not infected. Check for: ? Redness, swelling, or pain. ? Fluid or blood. ? Warmth. ? Pus or a bad smell. General instructions  Do not cross your legs. This may decrease blood flow to your feet.  Do not use heating pads or hot water bottles on your feet. They may burn your skin. If you have lost feeling in your feet or legs, you may not know this is  happening until it is too late.  Protect your feet from hot and cold by wearing shoes, such as at the beach or on hot pavement.  Schedule a complete foot exam at least once a year (annually) or more often if you have foot problems. If you have foot problems, report any cuts, sores, or bruises to your health care provider immediately. Contact a health care provider if:  You have a medical condition that increases your risk of infection and you have any cuts, sores, or bruises on your feet.  You have an injury that is not healing.  You have redness on your legs or feet.  You feel burning or tingling in your legs or feet.  You have pain or cramps in your legs and feet.  Your legs or feet are numb.  Your feet always feel cold.  You have pain around a toenail. Get help right away if:  You have a wound, scrape, corn, or callus on your foot and: ? You have pain, swelling, or redness that gets worse. ? You have fluid or blood coming from the wound, scrape, corn, or callus. ? Your wound, scrape, corn, or callus feels warm to the touch. ? You have pus or a bad smell coming from the wound, scrape, corn, or callus. ? You have a fever. ? You have a red line going up your leg. Summary  Check your feet every day for cuts, sores, red spots, swelling, and blisters.  Moisturize feet and legs daily.  Wear shoes that fit properly and have enough cushioning.  If you have foot problems, report any cuts, sores, or bruises to your health care provider immediately.  Schedule a complete foot exam at least once a year (annually) or more often if you have foot problems. This information is not intended to replace advice given to you by your health care provider. Make sure you discuss any questions you have with your health care provider. Document Released: 03/03/2000 Document Revised: 04/18/2017 Document Reviewed: 04/07/2016 Elsevier Interactive Patient Education  2019 [employer].

## 2018-07-22 ENCOUNTER — Other Ambulatory Visit: Payer: Self-pay | Admitting: Internal Medicine

## 2018-07-22 DIAGNOSIS — Z1231 Encounter for screening mammogram for malignant neoplasm of breast: Secondary | ICD-10-CM

## 2018-07-26 ENCOUNTER — Other Ambulatory Visit: Payer: Self-pay

## 2018-07-26 ENCOUNTER — Ambulatory Visit (HOSPITAL_COMMUNITY): Payer: Medicare Other

## 2018-07-26 ENCOUNTER — Ambulatory Visit (HOSPITAL_COMMUNITY): Admission: RE | Admit: 2018-07-26 | Payer: Medicare Other | Source: Ambulatory Visit

## 2018-07-26 ENCOUNTER — Ambulatory Visit (HOSPITAL_COMMUNITY)
Admission: RE | Admit: 2018-07-26 | Discharge: 2018-07-26 | Disposition: A | Discharge status: Home / Self Care | Payer: Medicare Other | Source: Ambulatory Visit | Attending: Internal Medicine | Admitting: Internal Medicine

## 2018-07-26 ENCOUNTER — Inpatient Hospital Stay: Payer: Medicare Other | Attending: Internal Medicine

## 2018-07-26 DIAGNOSIS — I10 Essential (primary) hypertension: Secondary | ICD-10-CM | POA: Diagnosis not present

## 2018-07-26 DIAGNOSIS — E119 Type 2 diabetes mellitus without complications: Secondary | ICD-10-CM | POA: Diagnosis not present

## 2018-07-26 DIAGNOSIS — E785 Hyperlipidemia, unspecified: Secondary | ICD-10-CM | POA: Insufficient documentation

## 2018-07-26 DIAGNOSIS — C3412 Malignant neoplasm of upper lobe, left bronchus or lung: Secondary | ICD-10-CM | POA: Insufficient documentation

## 2018-07-26 DIAGNOSIS — Z9012 Acquired absence of left breast and nipple: Secondary | ICD-10-CM | POA: Insufficient documentation

## 2018-07-26 DIAGNOSIS — Z7982 Long term (current) use of aspirin: Secondary | ICD-10-CM | POA: Insufficient documentation

## 2018-07-26 DIAGNOSIS — Z853 Personal history of malignant neoplasm of breast: Secondary | ICD-10-CM | POA: Insufficient documentation

## 2018-07-26 DIAGNOSIS — Z902 Acquired absence of lung [part of]: Secondary | ICD-10-CM | POA: Insufficient documentation

## 2018-07-26 DIAGNOSIS — K219 Gastro-esophageal reflux disease without esophagitis: Secondary | ICD-10-CM | POA: Insufficient documentation

## 2018-07-26 DIAGNOSIS — Z79899 Other long term (current) drug therapy: Secondary | ICD-10-CM | POA: Insufficient documentation

## 2018-07-26 DIAGNOSIS — C3490 Malignant neoplasm of unspecified part of unspecified bronchus or lung: Secondary | ICD-10-CM | POA: Diagnosis not present

## 2018-07-26 DIAGNOSIS — M79672 Pain in left foot: Secondary | ICD-10-CM | POA: Insufficient documentation

## 2018-07-26 DIAGNOSIS — Z7984 Long term (current) use of oral hypoglycemic drugs: Secondary | ICD-10-CM | POA: Diagnosis not present

## 2018-07-26 DIAGNOSIS — I2510 Atherosclerotic heart disease of native coronary artery without angina pectoris: Secondary | ICD-10-CM | POA: Diagnosis not present

## 2018-07-26 LAB — CBC WITH DIFFERENTIAL (CANCER CENTER ONLY)
Abs Immature Granulocytes: 0.02 10*3/uL (ref 0.00–0.07)
Basophils Absolute: 0 10*3/uL (ref 0.0–0.1)
Basophils Relative: 0 %
Eosinophils Absolute: 0.1 10*3/uL (ref 0.0–0.5)
Eosinophils Relative: 1 %
HCT: 34 % — ABNORMAL LOW (ref 36.0–46.0)
Hemoglobin: 10.9 g/dL — ABNORMAL LOW (ref 12.0–15.0)
Immature Granulocytes: 0 %
Lymphocytes Relative: 31 %
Lymphs Abs: 1.7 10*3/uL (ref 0.7–4.0)
MCH: 27 pg (ref 26.0–34.0)
MCHC: 32.1 g/dL (ref 30.0–36.0)
MCV: 84.2 fL (ref 80.0–100.0)
Monocytes Absolute: 0.5 10*3/uL (ref 0.1–1.0)
Monocytes Relative: 9 %
Neutro Abs: 3.2 10*3/uL (ref 1.7–7.7)
Neutrophils Relative %: 59 %
Platelet Count: 330 10*3/uL (ref 150–400)
RBC: 4.04 MIL/uL (ref 3.87–5.11)
RDW: 16.7 % — ABNORMAL HIGH (ref 11.5–15.5)
WBC Count: 5.4 10*3/uL (ref 4.0–10.5)
nRBC: 0 % (ref 0.0–0.2)

## 2018-07-26 LAB — CMP (CANCER CENTER ONLY)
ALT: 12 U/L (ref 0–44)
AST: 15 U/L (ref 15–41)
Albumin: 3.8 g/dL (ref 3.5–5.0)
Alkaline Phosphatase: 70 U/L (ref 38–126)
Anion gap: 11 (ref 5–15)
BUN: 24 mg/dL — ABNORMAL HIGH (ref 8–23)
CO2: 25 mmol/L (ref 22–32)
Calcium: 9.2 mg/dL (ref 8.9–10.3)
Chloride: 106 mmol/L (ref 98–111)
Creatinine: 1.02 mg/dL — ABNORMAL HIGH (ref 0.44–1.00)
GFR, Est AFR Am: >60 mL/min (ref >60)
GFR, Estimated: 54 mL/min — ABNORMAL LOW (ref >60)
Glucose, Bld: 90 mg/dL (ref 70–99)
Potassium: 3.8 mmol/L (ref 3.5–5.1)
Sodium: 142 mmol/L (ref 135–145)
Total Bilirubin: 0.3 mg/dL (ref 0.3–1.2)
Total Protein: 7.8 g/dL (ref 6.5–8.1)

## 2018-07-26 MED ORDER — IOHEXOL 300 MG/ML  SOLN
75.0000 mL | Freq: Once | INTRAMUSCULAR | Status: AC | PRN
  Administered 2018-07-26: 12:00:00 75 mL via INTRAVENOUS

## 2018-07-26 MED ORDER — SODIUM CHLORIDE (PF) 0.9 % IJ SOLN
INTRAMUSCULAR | Status: AC
  Filled 2018-07-26: qty 50

## 2018-07-29 ENCOUNTER — Other Ambulatory Visit: Payer: Self-pay

## 2018-07-29 ENCOUNTER — Encounter: Payer: Self-pay | Admitting: Internal Medicine

## 2018-07-29 ENCOUNTER — Inpatient Hospital Stay (HOSPITAL_BASED_OUTPATIENT_CLINIC_OR_DEPARTMENT_OTHER): Payer: Medicare Other | Admitting: Internal Medicine

## 2018-07-29 VITALS — BP 149/70 | HR 92 | Temp 98.3°F | Resp 20 | Ht 61.0 in | Wt 152.3 lb

## 2018-07-29 DIAGNOSIS — Z79899 Other long term (current) drug therapy: Secondary | ICD-10-CM

## 2018-07-29 DIAGNOSIS — Z853 Personal history of malignant neoplasm of breast: Secondary | ICD-10-CM | POA: Diagnosis not present

## 2018-07-29 DIAGNOSIS — Z7984 Long term (current) use of oral hypoglycemic drugs: Secondary | ICD-10-CM

## 2018-07-29 DIAGNOSIS — I10 Essential (primary) hypertension: Secondary | ICD-10-CM

## 2018-07-29 DIAGNOSIS — Z7982 Long term (current) use of aspirin: Secondary | ICD-10-CM | POA: Diagnosis not present

## 2018-07-29 DIAGNOSIS — E785 Hyperlipidemia, unspecified: Secondary | ICD-10-CM

## 2018-07-29 DIAGNOSIS — Z902 Acquired absence of lung [part of]: Secondary | ICD-10-CM

## 2018-07-29 DIAGNOSIS — E119 Type 2 diabetes mellitus without complications: Secondary | ICD-10-CM

## 2018-07-29 DIAGNOSIS — C3412 Malignant neoplasm of upper lobe, left bronchus or lung: Secondary | ICD-10-CM

## 2018-07-29 DIAGNOSIS — M79672 Pain in left foot: Secondary | ICD-10-CM

## 2018-07-29 DIAGNOSIS — C3490 Malignant neoplasm of unspecified part of unspecified bronchus or lung: Secondary | ICD-10-CM

## 2018-07-29 DIAGNOSIS — K219 Gastro-esophageal reflux disease without esophagitis: Secondary | ICD-10-CM

## 2018-07-29 DIAGNOSIS — C3492 Malignant neoplasm of unspecified part of left bronchus or lung: Secondary | ICD-10-CM

## 2018-07-29 DIAGNOSIS — Z9012 Acquired absence of left breast and nipple: Secondary | ICD-10-CM

## 2018-07-29 NOTE — Progress Notes (Signed)
[place] [phone]   Fax:([phone]) [place], [place] [address] [place] [address]  DIAGNOSIS:  stage IA (T1 a, N0, M0) non-small cell lung cancer, adenocarcinoma presented with left upper lobe lung nodule.  PRIOR THERAPY: status post left upper lobectomy with lymph node dissection under the care of Dr. Roxan Hockey on June 04, 2017.  CURRENT THERAPY: Observation.  INTERVAL HISTORY: Karen West 78 y.o. female returns to the clinic today for 6 months follow-up visit.  The patient is feeling fine today with no concerning complaints.  Except for pain in her left foot.  She was seen by the foot doctor recently and was given prescription for local creams.  She denied having any chest pain, shortness of breath, cough or hemoptysis.  She denied having any fever or chills.  She has no nausea, vomiting, diarrhea or constipation.  She denied having any headache or visual changes.  She had repeat CT scan of the chest performed recently and she is here for evaluation and discussion of her scan results.   MEDICAL HISTORY: Past Medical History:  Diagnosis Date  . Atherosclerosis of aorta ([place])    a. 01/2017/03/2017 - noted on high res chest CTs.  . Breast cancer ([place])    a. Bilateral --> s/p left mastectomy  . Carotid artery disease ([place])    a. 08/628 w/ 16-01% LICA stenosis and <09% RICA stenosis; b. 10/2015 Carotid U/S: < 50% BICA stenosis  . Chest pain    a. 09/2011 MV: EF 68%, no ischemia/infarct.  . Chronic anemia   . Chronic headaches    denies  . Coronary artery calcification seen on CT scan    a. 01/2017 High res CT: atherosclerotic calcification of the arterial vascularture, including severe involvement of the coronary arteries; b. 03/2017 CT Chest: coronary and Ao atheroscelrosis.  Marland Kitchen GERD (gastroesophageal reflux disease)   . History of echocardiogram    a. 09/2011 Echo: EF 55-60%, no rwma, triv  AI, PASP 84mmHg.  Marland Kitchen Hyperlipidemia   . Hypertension   . Hyperthyroidism   . Left upper lobe pulmonary nodule    a. 02/2017 PET: slowing enlarging 1.7cm LUL nodule w/ low-grade metabolic activity; b. 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 05/2017 s/p VATS.  . Multinodular goiter    a. 02/2017 PET scan- Hypermetabolic nodule;  b. 09/3218 s/p thyroidectomy  . Obesity   . OSA on CPAP    cpap  . Osteoarthritis   . Personal history of radiation therapy 1999  . Right bundle branch block   . 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  MEDICATIONS:  Current Outpatient Medications  Medication Sig Dispense Refill  . acetaminophen (TYLENOL) 500 MG tablet Take 1,000 mg by mouth every 6 (six) hours as needed for moderate pain or headache.    Marland Kitchen amLODipine (NORVASC) 10 MG tablet Take 10 mg by mouth daily.    Marland Kitchen aspirin 81 MG tablet Take 81 mg by mouth daily.      . colchicine 0.6 MG tablet colchicine 0.6 mg tablet  TAKE 1 TABLET BY MOUTH EVERY DAY*NOT COVERED*    . ezetimibe (ZETIA) 10 MG tablet Take 5 mg by mouth daily.     . ferrous sulfate 325 (65 FE) MG tablet TAKE 1 TABLET (325 MG TOTAL) BY MOUTH 3 (THREE) TIMES DAILY  WITH MEALS. 270 tablet 0  . glipiZIDE (GLUCOTROL) 10 MG 24 hr tablet Take 10 mg by mouth 2 (two) times daily.      . hydrocortisone cream 1 % Apply 1 application topically 2 (two) times daily.    Marland Kitchen ipratropium (ATROVENT) 0.06 % nasal spray ipratropium bromide 42 mcg (0.06 %) nasal spray    . levothyroxine (SYNTHROID, LEVOTHROID) 88 MCG tablet Take 88 mcg by mouth daily.  5  . Magnesium Oxide 200 MG TABS Take 1 tablet by mouth daily.    . metFORMIN (GLUCOPHAGE) 1000 MG tablet Take 1,000 mg by mouth 2 (two) times daily.     . NONFORMULARY OR COMPOUNDED ITEM [facility]:  Achilles Tendonitis #12 - Diclofenac 3%, Baclofen 2%, Bupivacaine 1%,  Gabapentin 6%, Pentoxifylline 3%, Topiramate 1%. Apply 1-2 grams to affected area 3-4 times daily. 100 each 5  . Omega-3 Fatty Acids (FISH OIL) 1200 MG CAPS Take 1,200 mg by mouth 2 (two) times daily.     Marland Kitchen omeprazole (PRILOSEC) 20 MG capsule Take 20 mg by mouth 2 (two) times daily. Takes prn    . predniSONE (DELTASONE) 10 MG tablet TAKE 3 TABLETS FOR 4 DAYS, THEN DECREASE TO 2 TABS FOR 1 WEEK, THEN DECREASE TO 1 TABLET ONCE A DAY  0  . rosuvastatin (CRESTOR) 20 MG tablet Take 20 mg by mouth daily.    Marland Kitchen triamterene-hydrochlorothiazide (MAXZIDE-25) 37.5-25 MG tablet triamterene 37.5 mg-hydrochlorothiazide 25 mg tablet     No current facility-administered medications for this visit.     SURGICAL HISTORY:  Past Surgical History:  Procedure Laterality Date  . BREAST BIOPSY  1993; 1995; 2000   left; right; left  . BREAST EXCISIONAL BIOPSY Right pt unsure  . MASTECTOMY Left 2000   left  . THYROIDECTOMY  05/10/2017   VIDEO BRONCHOSCOPY WITH ENDOBRONCHIAL NAVIGATION (N/A)  . THYROIDECTOMY N/A 05/10/2017   Procedure: TOTAL THYROIDECTOMY;  Surgeon: Armandina Gemma, MD;  Location: [facility];  Service: General;  Laterality: N/A;  . VIDEO ASSISTED THORACOSCOPY (VATS)/ LOBECTOMY Left 06/04/2017   Procedure: VIDEO ASSISTED THORACOSCOPY (VATS)/LEFT UPPER LOBECTOMY;  Surgeon: [name], MD;  Location: [facility];  Service: Thoracic;  Laterality: Left;  Marland Kitchen VIDEO BRONCHOSCOPY WITH ENDOBRONCHIAL NAVIGATION N/A 05/10/2017   Procedure: VIDEO BRONCHOSCOPY WITH ENDOBRONCHIAL NAVIGATION;  Surgeon: [name], MD;  Location: [facility];  Service: Thoracic;  Laterality: N/A;  . VIDEO BRONCHOSCOPY WITH ENDOBRONCHIAL ULTRASOUND N/A 06/04/2017   Procedure: VIDEO BRONCHOSCOPY WITH ENDOBRONCHIAL ULTRASOUND;  Surgeon: [name], MD;  Location: [facility];  Service: Thoracic;  Laterality: N/A;    REVIEW OF SYSTEMS:  A comprehensive review of systems was negative.   PHYSICAL EXAMINATION: General appearance:  alert, cooperative and no distress Head: Normocephalic, without obvious abnormality, atraumatic Neck: no adenopathy, no JVD, supple, symmetrical, trachea midline and thyroid not enlarged, symmetric, no tenderness/mass/nodules Lymph nodes: Cervical, supraclavicular, and axillary nodes normal. Resp: clear to auscultation bilaterally Back: symmetric, no curvature. ROM normal. No CVA tenderness. Cardio: regular rate and rhythm, S1, S2 normal, no murmur, click, rub or gallop GI: soft, non-tender; bowel sounds normal; no masses,  no organomegaly Extremities: extremities normal, atraumatic, no cyanosis or edema  ECOG PERFORMANCE STATUS: 1 - Symptomatic but completely ambulatory  Blood pressure (!) 149/70, pulse 92, temperature 98.3 F (36.8 C), temperature source Oral, resp. rate 20, height 5\' 1"  (1.549 m), weight 152 lb 4.8 oz (69.1 kg), SpO2 100 %.  LABORATORY DATA: Lab Results  Component Value Date   WBC  5.4 07/26/2018   HGB 10.9 (L) 07/26/2018   HCT 34.0 (L) 07/26/2018   MCV 84.2 07/26/2018   PLT 330 07/26/2018      Chemistry      Component Value Date/Time   NA 142 07/26/2018 0943   K 3.8 07/26/2018 0943   CL 106 07/26/2018 0943   CO2 25 07/26/2018 0943   BUN 24 (H) 07/26/2018 0943   CREATININE 1.02 (H) 07/26/2018 0943   CREATININE 1.16 (H) 09/11/2017 1241      Component Value Date/Time   CALCIUM 9.2 07/26/2018 0943   ALKPHOS 70 07/26/2018 0943   AST 15 07/26/2018 0943   ALT 12 07/26/2018 0943   BILITOT 0.3 07/26/2018 0943       RADIOGRAPHIC STUDIES: Ct Chest W Contrast  Result Date: 07/26/2018 CLINICAL DATA:  Stage I non-small cell lung cancer, status post left upper lobectomy EXAM: CT CHEST WITH CONTRAST TECHNIQUE: Multidetector CT imaging of the chest was performed during intravenous contrast administration. CONTRAST:  15mL OMNIPAQUE IOHEXOL 300 MG/ML  SOLN COMPARISON:  01/25/2018 FINDINGS: Cardiovascular: Heart is top-normal in size. No pericardial effusion. No  evidence of thoracic aortic aneurysm. Atherosclerotic calcifications of the aortic arch. Coronary atherosclerosis of the LAD and right coronary artery. Mitral valve annular calcifications. Mediastinum/Nodes: Small mediastinal lymph nodes, including 8 mm short axis high right paratracheal node and an 8 mm short axis low right paratracheal node, within normal limits. Status post bilateral axillary lymph node dissection. Status post thyroidectomy. Lungs/Pleura: Status post left upper lobectomy. No suspicious pulmonary nodules. Mild scarring/compressive atelectasis in the medial right lower lobe. No focal consolidation. No pleural effusion or pneumothorax. Upper Abdomen: Visualized upper abdomen is notable for bilateral renal cysts, incompletely visualized. Stable 11 mm cystic lesion in the pancreatic tail. Musculoskeletal: Status post left mastectomy and right breast lumpectomy. Degenerative changes of the visualized thoracolumbar spine. IMPRESSION: Status post left upper lobectomy. No evidence of recurrent or metastatic disease. Small mediastinal lymph nodes, within normal limits. Status post left mastectomy and right breast lumpectomy with bilateral axillary lymph node dissection. Status post thyroidectomy. Aortic Atherosclerosis (ICD10-I70.0). Electronically Signed   By: Julian Hy M.D.   On: 07/26/2018 15:32    ASSESSMENT AND PLAN: This is a very pleasant 78 years old African-American female with a stage Ia non-small cell lung cancer, adenocarcinoma status post left upper lobectomy with lymph node dissection in March 2019 under the care of Dr. Roxan Hockey. The patient is currently on observation and she is feeling fine with no concerning complaints. She had repeat CT scan of the chest performed recently.  I personally and independently reviewed the scans and discussed the results with the patient today. Her scan showed no concerning findings for disease recurrence or progression. I recommended for the  patient to continue on observation with repeat CT scan of the chest in 6 months. She was advised to call immediately if she has any concerning symptoms in the interval. The patient voices understanding of current disease status and treatment options and is in agreement with the current care plan. All questions were answered. The patient knows to call the clinic with any problems, questions or concerns. We can certainly see the patient much sooner if necessary.  I spent 10 minutes counseling the patient face to face. The total time spent in the appointment was 15 minutes.  Disclaimer: This note was dictated with voice recognition software. Similar sounding words can inadvertently be transcribed and may not be corrected upon review.

## 2018-07-30 ENCOUNTER — Telehealth: Payer: Self-pay | Admitting: Internal Medicine

## 2018-07-30 NOTE — Telephone Encounter (Signed)
Scheduled appt per 5/11  [place] - sent reminder letter in the mail . F./u in 6 months.

## 2018-08-09 DIAGNOSIS — E038 Other specified hypothyroidism: Secondary | ICD-10-CM | POA: Diagnosis not present

## 2018-08-09 DIAGNOSIS — E785 Hyperlipidemia, unspecified: Secondary | ICD-10-CM | POA: Diagnosis not present

## 2018-08-09 DIAGNOSIS — E890 Postprocedural hypothyroidism: Secondary | ICD-10-CM | POA: Diagnosis not present

## 2018-08-09 DIAGNOSIS — E119 Type 2 diabetes mellitus without complications: Secondary | ICD-10-CM | POA: Diagnosis not present

## 2018-08-09 DIAGNOSIS — R7989 Other specified abnormal findings of blood chemistry: Secondary | ICD-10-CM | POA: Diagnosis not present

## 2018-08-09 DIAGNOSIS — D519 Vitamin B12 deficiency anemia, unspecified: Secondary | ICD-10-CM | POA: Diagnosis not present

## 2018-08-09 DIAGNOSIS — I10 Essential (primary) hypertension: Secondary | ICD-10-CM | POA: Diagnosis not present

## 2018-08-13 DIAGNOSIS — I10 Essential (primary) hypertension: Secondary | ICD-10-CM | POA: Diagnosis not present

## 2018-08-13 DIAGNOSIS — E785 Hyperlipidemia, unspecified: Secondary | ICD-10-CM | POA: Diagnosis not present

## 2018-08-13 DIAGNOSIS — D649 Anemia, unspecified: Secondary | ICD-10-CM | POA: Diagnosis not present

## 2018-08-13 DIAGNOSIS — E119 Type 2 diabetes mellitus without complications: Secondary | ICD-10-CM | POA: Diagnosis not present

## 2018-08-13 DIAGNOSIS — K219 Gastro-esophageal reflux disease without esophagitis: Secondary | ICD-10-CM | POA: Diagnosis not present

## 2018-08-15 DIAGNOSIS — Z8781 Personal history of (healed) traumatic fracture: Secondary | ICD-10-CM | POA: Diagnosis not present

## 2018-08-15 DIAGNOSIS — E785 Hyperlipidemia, unspecified: Secondary | ICD-10-CM | POA: Diagnosis not present

## 2018-08-15 DIAGNOSIS — E119 Type 2 diabetes mellitus without complications: Secondary | ICD-10-CM | POA: Diagnosis not present

## 2018-08-15 DIAGNOSIS — M064 Inflammatory polyarthropathy: Secondary | ICD-10-CM | POA: Diagnosis not present

## 2018-08-15 DIAGNOSIS — M1990 Unspecified osteoarthritis, unspecified site: Secondary | ICD-10-CM | POA: Diagnosis not present

## 2018-08-15 DIAGNOSIS — M79673 Pain in unspecified foot: Secondary | ICD-10-CM | POA: Diagnosis not present

## 2018-08-15 DIAGNOSIS — R768 Other specified abnormal immunological findings in serum: Secondary | ICD-10-CM | POA: Diagnosis not present

## 2018-09-13 ENCOUNTER — Ambulatory Visit
Admission: RE | Admit: 2018-09-13 | Discharge: 2018-09-13 | Disposition: A | Discharge status: Home / Self Care | Payer: Medicare Other | Source: Ambulatory Visit | Attending: Internal Medicine | Admitting: Internal Medicine

## 2018-09-13 ENCOUNTER — Other Ambulatory Visit: Payer: Self-pay | Admitting: Internal Medicine

## 2018-09-13 ENCOUNTER — Other Ambulatory Visit: Payer: Self-pay

## 2018-09-13 DIAGNOSIS — Z1231 Encounter for screening mammogram for malignant neoplasm of breast: Secondary | ICD-10-CM

## 2018-09-13 DIAGNOSIS — Z853 Personal history of malignant neoplasm of breast: Secondary | ICD-10-CM | POA: Diagnosis not present

## 2018-09-16 DIAGNOSIS — H2513 Age-related nuclear cataract, bilateral: Secondary | ICD-10-CM | POA: Diagnosis not present

## 2018-09-16 DIAGNOSIS — H40013 Open angle with borderline findings, low risk, bilateral: Secondary | ICD-10-CM | POA: Diagnosis not present

## 2018-10-17 DIAGNOSIS — E119 Type 2 diabetes mellitus without complications: Secondary | ICD-10-CM | POA: Diagnosis not present

## 2018-10-17 DIAGNOSIS — E1121 Type 2 diabetes mellitus with diabetic nephropathy: Secondary | ICD-10-CM | POA: Diagnosis not present

## 2018-10-17 DIAGNOSIS — I10 Essential (primary) hypertension: Secondary | ICD-10-CM | POA: Diagnosis not present

## 2018-10-22 DIAGNOSIS — R350 Frequency of micturition: Secondary | ICD-10-CM | POA: Diagnosis not present

## 2018-10-22 DIAGNOSIS — G4733 Obstructive sleep apnea (adult) (pediatric): Secondary | ICD-10-CM | POA: Diagnosis not present

## 2018-10-22 DIAGNOSIS — Z0001 Encounter for general adult medical examination with abnormal findings: Secondary | ICD-10-CM | POA: Diagnosis not present

## 2018-10-22 DIAGNOSIS — N183 Chronic kidney disease, stage 3 (moderate): Secondary | ICD-10-CM | POA: Diagnosis not present

## 2018-10-22 DIAGNOSIS — M791 Myalgia, unspecified site: Secondary | ICD-10-CM | POA: Diagnosis not present

## 2018-10-22 DIAGNOSIS — E785 Hyperlipidemia, unspecified: Secondary | ICD-10-CM | POA: Diagnosis not present

## 2018-10-22 DIAGNOSIS — E1121 Type 2 diabetes mellitus with diabetic nephropathy: Secondary | ICD-10-CM | POA: Diagnosis not present

## 2018-10-22 DIAGNOSIS — E890 Postprocedural hypothyroidism: Secondary | ICD-10-CM | POA: Diagnosis not present

## 2018-10-22 DIAGNOSIS — M1990 Unspecified osteoarthritis, unspecified site: Secondary | ICD-10-CM | POA: Diagnosis not present

## 2018-10-22 DIAGNOSIS — K219 Gastro-esophageal reflux disease without esophagitis: Secondary | ICD-10-CM | POA: Diagnosis not present

## 2018-10-22 DIAGNOSIS — I10 Essential (primary) hypertension: Secondary | ICD-10-CM | POA: Diagnosis not present

## 2018-10-28 DIAGNOSIS — Z1212 Encounter for screening for malignant neoplasm of rectum: Secondary | ICD-10-CM | POA: Diagnosis not present

## 2018-10-28 DIAGNOSIS — Z1211 Encounter for screening for malignant neoplasm of colon: Secondary | ICD-10-CM | POA: Diagnosis not present

## 2018-11-11 DIAGNOSIS — K210 Gastro-esophageal reflux disease with esophagitis: Secondary | ICD-10-CM | POA: Diagnosis not present

## 2018-11-11 DIAGNOSIS — D509 Iron deficiency anemia, unspecified: Secondary | ICD-10-CM | POA: Diagnosis not present

## 2018-11-11 DIAGNOSIS — R195 Other fecal abnormalities: Secondary | ICD-10-CM | POA: Diagnosis not present

## 2018-11-12 ENCOUNTER — Ambulatory Visit (INDEPENDENT_AMBULATORY_CARE_PROVIDER_SITE_OTHER): Payer: Medicare Other | Admitting: Podiatry

## 2018-11-12 ENCOUNTER — Encounter: Payer: Self-pay | Admitting: Podiatry

## 2018-11-12 ENCOUNTER — Other Ambulatory Visit: Payer: Self-pay

## 2018-11-12 DIAGNOSIS — M79675 Pain in left toe(s): Secondary | ICD-10-CM | POA: Diagnosis not present

## 2018-11-12 DIAGNOSIS — M79674 Pain in right toe(s): Secondary | ICD-10-CM | POA: Diagnosis not present

## 2018-11-12 DIAGNOSIS — M792 Neuralgia and neuritis, unspecified: Secondary | ICD-10-CM

## 2018-11-12 DIAGNOSIS — B351 Tinea unguium: Secondary | ICD-10-CM

## 2018-11-12 NOTE — Patient Instructions (Signed)
Diabetes Mellitus and Foot Care Foot care is an important part of your health, especially when you have diabetes. Diabetes may cause you to have problems because of poor blood flow (circulation) to your feet and legs, which can cause your skin to:  Become thinner and drier.  Break more easily.  Heal more slowly.  Peel and crack. You may also have nerve damage (neuropathy) in your legs and feet, causing decreased feeling in them. This means that you may not notice minor injuries to your feet that could lead to more serious problems. Noticing and addressing any potential problems early is the best way to prevent future foot problems. How to care for your feet Foot hygiene  Wash your feet daily with warm water and mild soap. Do not use hot water. Then, pat your feet and the areas between your toes until they are completely dry. Do not soak your feet as this can dry your skin.  Trim your toenails straight across. Do not dig under them or around the cuticle. File the edges of your nails with an emery board or nail file.  Apply a moisturizing lotion or petroleum jelly to the skin on your feet and to dry, brittle toenails. Use lotion that does not contain alcohol and is unscented. Do not apply lotion between your toes. Shoes and socks  Wear clean socks or stockings every day. Make sure they are not too tight. Do not wear knee-high stockings since they may decrease blood flow to your legs.  Wear shoes that fit properly and have enough cushioning. Always look in your shoes before you put them on to be sure there are no objects inside.  To break in new shoes, wear them for just a few hours a day. This prevents injuries on your feet. Wounds, scrapes, corns, and calluses  Check your feet daily for blisters, cuts, bruises, sores, and redness. If you cannot see the bottom of your feet, use a mirror or ask someone for help.  Do not cut corns or calluses or try to remove them with medicine.  If you  find a minor scrape, cut, or break in the skin on your feet, keep it and the skin around it clean and dry. You may clean these areas with mild soap and water. Do not clean the area with peroxide, alcohol, or iodine.  If you have a wound, scrape, corn, or callus on your foot, look at it several times a day to make sure it is healing and not infected. Check for: ? Redness, swelling, or pain. ? Fluid or blood. ? Warmth. ? Pus or a bad smell. General instructions  Do not cross your legs. This may decrease blood flow to your feet.  Do not use heating pads or hot water bottles on your feet. They may burn your skin. If you have lost feeling in your feet or legs, you may not know this is happening until it is too late.  Protect your feet from hot and cold by wearing shoes, such as at the beach or on hot pavement.  Schedule a complete foot exam at least once a year (annually) or more often if you have foot problems. If you have foot problems, report any cuts, sores, or bruises to your health care provider immediately. Contact a health care provider if:  You have a medical condition that increases your risk of infection and you have any cuts, sores, or bruises on your feet.  You have an injury that is not   healing.  You have redness on your legs or feet.  You feel burning or tingling in your legs or feet.  You have pain or cramps in your legs and feet.  Your legs or feet are numb.  Your feet always feel cold.  You have pain around a toenail. Get help right away if:  You have a wound, scrape, corn, or callus on your foot and: ? You have pain, swelling, or redness that gets worse. ? You have fluid or blood coming from the wound, scrape, corn, or callus. ? Your wound, scrape, corn, or callus feels warm to the touch. ? You have pus or a bad smell coming from the wound, scrape, corn, or callus. ? You have a fever. ? You have a red line going up your leg. Summary  Check your feet every day  for cuts, sores, red spots, swelling, and blisters.  Moisturize feet and legs daily.  Wear shoes that fit properly and have enough cushioning.  If you have foot problems, report any cuts, sores, or bruises to your health care provider immediately.  Schedule a complete foot exam at least once a year (annually) or more often if you have foot problems. This information is not intended to replace advice given to you by your health care provider. Make sure you discuss any questions you have with your health care provider. Document Released: 03/03/2000 Document Revised: 04/18/2017 Document Reviewed: 04/07/2016 Elsevier Patient Education  2020 [employer] Inc.   Onychomycosis/Fungal Toenails  WHAT IS IT? An infection that lies within the keratin of your nail plate that is caused by a fungus.  WHY ME? Fungal infections affect all ages, sexes, races, and creeds.  There may be many factors that predispose you to a fungal infection such as age, coexisting medical conditions such as diabetes, or an autoimmune disease; stress, medications, fatigue, genetics, etc.  Bottom line: fungus thrives in a warm, moist environment and your shoes offer such a location.  IS IT CONTAGIOUS? Theoretically, yes.  You do not want to share shoes, nail clippers or files with someone who has fungal toenails.  Walking around barefoot in the same room or sleeping in the same bed is unlikely to transfer the organism.  It is important to realize, however, that fungus can spread easily from one nail to the next on the same foot.  HOW DO WE TREAT THIS?  There are several ways to treat this condition.  Treatment may depend on many factors such as age, medications, pregnancy, liver and kidney conditions, etc.  It is best to ask your doctor which options are available to you.  1. No treatment.   Unlike many other medical concerns, you can live with this condition.  However for many people this can be a painful condition and may lead to  ingrown toenails or a bacterial infection.  It is recommended that you keep the nails cut short to help reduce the amount of fungal nail. 2. Topical treatment.  These range from herbal remedies to prescription strength nail lacquers.  About 40-50% effective, topicals require twice daily application for approximately 9 to 12 months or until an entirely new nail has grown out.  The most effective topicals are medical grade medications available through physicians offices. 3. Oral antifungal medications.  With an 80-90% cure rate, the most common oral medication requires 3 to 4 months of therapy and stays in your system for a year as the new nail grows out.  Oral antifungal medications do require   blood work to make sure it is a safe drug for you.  A liver function panel will be performed prior to starting the medication and after the first month of treatment.  It is important to have the blood work performed to avoid any harmful side effects.  In general, this medication safe but blood work is required. 4. Laser Therapy.  This treatment is performed by applying a specialized laser to the affected nail plate.  This therapy is noninvasive, fast, and non-painful.  It is not covered by insurance and is therefore, out of pocket.  The results have been very good with a 80-95% cure rate.  The [facility] is the only practice in the area to offer this therapy. 5. Permanent Nail Avulsion.  Removing the entire nail so that a new nail will not grow back. 

## 2018-11-19 DIAGNOSIS — D124 Benign neoplasm of descending colon: Secondary | ICD-10-CM | POA: Diagnosis not present

## 2018-11-19 DIAGNOSIS — K5140 Inflammatory polyps of colon without complications: Secondary | ICD-10-CM | POA: Diagnosis not present

## 2018-11-19 DIAGNOSIS — K635 Polyp of colon: Secondary | ICD-10-CM | POA: Diagnosis not present

## 2018-11-19 DIAGNOSIS — K5730 Diverticulosis of large intestine without perforation or abscess without bleeding: Secondary | ICD-10-CM | POA: Diagnosis not present

## 2018-11-19 DIAGNOSIS — K210 Gastro-esophageal reflux disease with esophagitis: Secondary | ICD-10-CM | POA: Diagnosis not present

## 2018-11-19 DIAGNOSIS — D509 Iron deficiency anemia, unspecified: Secondary | ICD-10-CM | POA: Diagnosis not present

## 2018-11-19 DIAGNOSIS — K449 Diaphragmatic hernia without obstruction or gangrene: Secondary | ICD-10-CM | POA: Diagnosis not present

## 2018-11-19 DIAGNOSIS — Z1211 Encounter for screening for malignant neoplasm of colon: Secondary | ICD-10-CM | POA: Diagnosis not present

## 2018-11-19 DIAGNOSIS — R195 Other fecal abnormalities: Secondary | ICD-10-CM | POA: Diagnosis not present

## 2018-11-20 NOTE — Progress Notes (Signed)
Subjective: Karen West presents today with history of neuropathy. Patient seen for follow up of chronic, painful mycotic toenails  which interfere with daily activities and routine tasks.  Pain is aggravated when wearing enclosed shoe gear. Pain is getting progressively worse and relieved with periodic professional debridement.   Patient states she feels she has socks on her feet. She does have Rx for neuropathy cream from [place], but hasn't had it refilled. She related it worked on her last visit.   Deland Pretty, MD is her PCP.    Current Outpatient Medications:  .  acetaminophen (TYLENOL) 500 MG tablet, Take 1,000 mg by mouth every 6 (six) hours as needed for moderate pain or headache., Disp: , Rfl:  .  amLODipine (NORVASC) 10 MG tablet, Take 10 mg by mouth daily., Disp: , Rfl:  .  aspirin 81 MG tablet, Take 81 mg by mouth daily.  , Disp: , Rfl:  .  ezetimibe (ZETIA) 10 MG tablet, Take 5 mg by mouth daily. , Disp: , Rfl:  .  ferrous sulfate 325 (65 FE) MG tablet, Take 325 mg by mouth 2 (two) times daily., Disp: , Rfl:  .  glipiZIDE (GLUCOTROL) 10 MG 24 hr tablet, Take 10 mg by mouth 2 (two) times daily.  , Disp: , Rfl:  .  hydrocortisone cream 1 %, Apply 1 application topically 2 (two) times daily., Disp: , Rfl:  .  levothyroxine (SYNTHROID, LEVOTHROID) 88 MCG tablet, Take 88 mcg by mouth daily., Disp: , Rfl: 5 .  Magnesium Oxide 200 MG TABS, Take 1 tablet by mouth daily., Disp: , Rfl:  .  metFORMIN (GLUCOPHAGE) 1000 MG tablet, Take 1,000 mg by mouth 2 (two) times daily. , Disp: , Rfl:  .  NONFORMULARY OR COMPOUNDED ITEM, [facility]:  Achilles Tendonitis #12 - Diclofenac 3%, Baclofen 2%, Bupivacaine 1%, Gabapentin 6%, Pentoxifylline 3%, Topiramate 1%. Apply 1-2 grams to affected area 3-4 times daily., Disp: 100 each, Rfl: 5 .  Omega-3 Fatty Acids (FISH OIL) 1200 MG CAPS, Take 1,200 mg by mouth 2 (two) times daily. , Disp: , Rfl:  .  omeprazole (PRILOSEC) 20 MG  capsule, Take 20 mg by mouth 2 (two) times daily. Takes prn, Disp: , Rfl:  .  rosuvastatin (CRESTOR) 20 MG tablet, Take 20 mg by mouth daily., Disp: , Rfl:   Allergies  Allergen Reactions  . Tramadol Nausea And Vomiting and Other (See Comments)    "got sick to my stomach and was throwing up blood; it put me in the hospital")  . Lisinopril Rash and Other (See Comments)    Renal failure   . Tapazole [Methimazole] Itching and Rash    Objective:  Vascular Examination: Capillary refill time <3 seconds x 10 digits.  Dorsalis pedis pulses 1/4 b/l.  Posterior tibial pulses 1/4 b/l.  Digital hair present x 10 digits.  Skin temperature WNL b/l.  Dermatological Examination: Skin with normal turgor, texture and tone b/l.  Toenails 1-5 b/l discolored, thick, dystrophic with subungual debris and pain with palpation to nailbeds due to thickness of nails.  Musculoskeletal: Muscle strength 5/5 to all LE muscle groups.  Neurological: Sensation intact 5/5 b/l with 10 gram monofilament.  Vibratory sensation diminished b/l.  Assessment: 1. Painful onychomycosis toenails 1-5 b/l 2. NIDDM 3. Neuropathic pain   Plan: 1. Toenails 1-5 b/l were debrided in length and girth without iatrogenic bleeding. 2. Regarding her neuropathy symptoms, patient states she will get Rx filled for neuropathy cream.  3. Patient  to continue soft, supportive shoe gear daily. 4. Patient to report any pedal injuries to medical professional immediately. 5. Follow up 3 months.  6. Patient/POA to call should there be a concern in the interim.

## 2018-12-19 DIAGNOSIS — R809 Proteinuria, unspecified: Secondary | ICD-10-CM | POA: Diagnosis not present

## 2018-12-19 DIAGNOSIS — Z23 Encounter for immunization: Secondary | ICD-10-CM | POA: Diagnosis not present

## 2018-12-19 DIAGNOSIS — C50919 Malignant neoplasm of unspecified site of unspecified female breast: Secondary | ICD-10-CM | POA: Diagnosis not present

## 2018-12-19 DIAGNOSIS — D631 Anemia in chronic kidney disease: Secondary | ICD-10-CM | POA: Diagnosis not present

## 2018-12-19 DIAGNOSIS — N183 Chronic kidney disease, stage 3 unspecified: Secondary | ICD-10-CM | POA: Diagnosis not present

## 2018-12-19 DIAGNOSIS — I129 Hypertensive chronic kidney disease with stage 1 through stage 4 chronic kidney disease, or unspecified chronic kidney disease: Secondary | ICD-10-CM | POA: Diagnosis not present

## 2018-12-19 DIAGNOSIS — E1122 Type 2 diabetes mellitus with diabetic chronic kidney disease: Secondary | ICD-10-CM | POA: Diagnosis not present

## 2018-12-19 DIAGNOSIS — N390 Urinary tract infection, site not specified: Secondary | ICD-10-CM | POA: Diagnosis not present

## 2018-12-19 DIAGNOSIS — E039 Hypothyroidism, unspecified: Secondary | ICD-10-CM | POA: Diagnosis not present

## 2018-12-19 DIAGNOSIS — N179 Acute kidney failure, unspecified: Secondary | ICD-10-CM | POA: Diagnosis not present

## 2018-12-19 DIAGNOSIS — C3490 Malignant neoplasm of unspecified part of unspecified bronchus or lung: Secondary | ICD-10-CM | POA: Diagnosis not present

## 2018-12-24 ENCOUNTER — Other Ambulatory Visit: Payer: Self-pay | Admitting: Internal Medicine

## 2018-12-24 DIAGNOSIS — I6529 Occlusion and stenosis of unspecified carotid artery: Secondary | ICD-10-CM

## 2018-12-25 ENCOUNTER — Encounter: Payer: Self-pay | Admitting: Podiatry

## 2018-12-25 ENCOUNTER — Ambulatory Visit (INDEPENDENT_AMBULATORY_CARE_PROVIDER_SITE_OTHER): Payer: Medicare Other | Admitting: Podiatry

## 2018-12-25 ENCOUNTER — Other Ambulatory Visit: Payer: Self-pay

## 2018-12-25 DIAGNOSIS — E0840 Diabetes mellitus due to underlying condition with diabetic neuropathy, unspecified: Secondary | ICD-10-CM | POA: Diagnosis not present

## 2018-12-25 DIAGNOSIS — M792 Neuralgia and neuritis, unspecified: Secondary | ICD-10-CM

## 2018-12-25 NOTE — Progress Notes (Signed)
Subjective:  Patient ID: Karen West, female    DOB: [birth year],  MRN: [id number]  Chief Complaint  Patient presents with  . Foot Pain    pt is here for bil foot pain, pt states that she has numbness in her toes as well as her ankle, pt also states that pain has been going over the course of about a year    78 y.o. female presents with the above complaint.  Patient states that she has tried apothecary cream to consist of gabapentin multiple of the mixtures but has not helped with the neuropathic pain to bilateral forefoot.  She states that this has been going for the past 3 weeks and has been gradually worsening in terms of pain.  She states that it is continuous throughout the day.  It is mostly located in the plantar forefoot bilaterally.   Review of Systems: Negative except as noted in the HPI. Denies N/V/F/Ch.  Past Medical History:  Diagnosis Date  . Atherosclerosis of aorta ([place])    a. 01/2017/03/2017 - noted on high res chest CTs.  . Breast cancer ([place])    a. Bilateral --> s/p left mastectomy  . Carotid artery disease ([place])    a. 11/6293 w/ 28-41% LICA stenosis and <32% RICA stenosis; b. 10/2015 Carotid U/S: < 50% BICA stenosis  . Chest pain    a. 09/2011 MV: EF 68%, no ischemia/infarct.  . Chronic anemia   . Chronic headaches    denies  . Coronary artery calcification seen on CT scan    a. 01/2017 High res CT: atherosclerotic calcification of the arterial vascularture, including severe involvement of the coronary arteries; b. 03/2017 CT Chest: coronary and Ao atheroscelrosis.  Marland Kitchen GERD (gastroesophageal reflux disease)   . History of echocardiogram    a. 09/2011 Echo: EF 55-60%, no rwma, triv AI, PASP 24mmHg.  Marland Kitchen Hyperlipidemia   . Hypertension   . Hyperthyroidism   . Left upper lobe pulmonary nodule    a. 02/2017 PET: slowing enlarging 1.7cm LUL nodule w/ low-grade metabolic activity; b. 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 05/2017 s/p VATS.  . Multinodular goiter    a. 02/2017 PET scan- Hypermetabolic nodule;  b. 04/7251 s/p thyroidectomy  . Obesity   . OSA on CPAP    cpap  . Osteoarthritis   . Personal history of radiation therapy 1999  . Right bundle branch block   . Type II or unspecified type diabetes mellitus without mention of complication, uncontrolled     Current Outpatient Medications:  .  acetaminophen (TYLENOL) 500 MG tablet, Take 1,000 mg by mouth every 6 (six) hours as needed for moderate pain or headache., Disp: , Rfl:  .  amLODipine (NORVASC) 10 MG tablet, Take 10 mg by mouth daily., Disp: , Rfl:  .  aspirin 81 MG tablet, Take 81 mg by mouth daily.  , Disp: , Rfl:  .  ezetimibe (ZETIA) 10 MG tablet, Take 5 mg by mouth daily. , Disp: , Rfl:  .  ferrous sulfate 325 (65 FE) MG tablet, Take 325 mg by mouth 2 (two) times daily., Disp: , Rfl:  .  GAVILYTE-G 236 g solution, , Disp: , Rfl:  .  glipiZIDE (GLUCOTROL) 10 MG 24 hr tablet, Take 10 mg by mouth 2 (two) times daily.  , Disp: , Rfl:  .  hydrocortisone cream 1 %, Apply 1 application topically 2 (two) times daily., Disp: , Rfl:  .  levothyroxine (SYNTHROID, LEVOTHROID) 88 MCG tablet, Take 88 mcg by  mouth daily., Disp: , Rfl: 5 .  Magnesium Oxide 200 MG TABS, Take 1 tablet by mouth daily., Disp: , Rfl:  .  metFORMIN (GLUCOPHAGE) 1000 MG tablet, Take 1,000 mg by mouth 2 (two) times daily. , Disp: , Rfl:  .  NONFORMULARY OR COMPOUNDED ITEM, [facility]:  Achilles Tendonitis #12 - Diclofenac 3%, Baclofen 2%, Bupivacaine 1%, Gabapentin 6%, Pentoxifylline 3%, Topiramate 1%. Apply 1-2 grams to affected area 3-4 times daily., Disp: 100 each, Rfl: 5 .  Omega-3 Fatty Acids (FISH OIL) 1200 MG CAPS, Take 1,200 mg by mouth 2 (two) times daily. , Disp: , Rfl:  .  omeprazole (PRILOSEC) 20 MG capsule, Take 20 mg by mouth 2 (two) times daily. Takes prn, Disp: , Rfl:  .  rosuvastatin (CRESTOR) 20 MG tablet, Take 20 mg by mouth daily., Disp: , Rfl:   Social History   Tobacco Use  Smoking Status  Never Smoker  Smokeless Tobacco Never Used    Allergies  Allergen Reactions  . Tramadol Nausea And Vomiting and Other (See Comments)    "got sick to my stomach and was throwing up blood; it put me in the hospital")  . Lisinopril Rash and Other (See Comments)    Renal failure   . Tapazole [Methimazole] Itching and Rash   Objective:  There were no vitals filed for this visit. There is no height or weight on file to calculate BMI. Constitutional Well developed. Well nourished.  Vascular  unable to palpate DP PT bilaterally Capillary refill normal to all digits.  No cyanosis or clubbing noted. Pedal hair growth normal.  Neurologic Normal speech. Oriented to person, place, and time. Diminished protective sensation to bilateral digits and metatarsal heads.  Dermatologic Nails well groomed and normal in appearance. No open wounds. No skin lesions.  Orthopedic:  No orthopedic complaints at this time.  Ankle and first MPJ range of motion is intact.   Radiographs: None Assessment:   1. Neuropathic pain   2. Diabetes mellitus due to underlying condition with diabetic neuropathy, unspecified whether long term insulin use ([place])    Plan:  Patient was evaluated and treated and all questions answered.  Acute onset neuropathic pain -I encouraged patient to continue taking gabapentin.  However patient with gabapentin as she does not like any p.o. medications. -I encouraged patient to to use the name apothecary line apothecary cream specialized for neuropathic pain.  I instructed her to apply daily once a day.  She states that she will try this. -Educated her on diabetes mellitus complications including neuropathy and I explained to her that unfortunately we cannot get rid of neuropathy however we can control and slow down the progression.   Return in about 3 months (around 03/27/2019).

## 2018-12-31 ENCOUNTER — Ambulatory Visit
Admission: RE | Admit: 2018-12-31 | Discharge: 2018-12-31 | Disposition: A | Discharge status: Home / Self Care | Payer: Medicare Other | Source: Ambulatory Visit | Attending: Internal Medicine | Admitting: Internal Medicine

## 2018-12-31 DIAGNOSIS — I10 Essential (primary) hypertension: Secondary | ICD-10-CM | POA: Diagnosis not present

## 2018-12-31 DIAGNOSIS — I6529 Occlusion and stenosis of unspecified carotid artery: Secondary | ICD-10-CM

## 2018-12-31 DIAGNOSIS — E119 Type 2 diabetes mellitus without complications: Secondary | ICD-10-CM | POA: Diagnosis not present

## 2018-12-31 DIAGNOSIS — I6523 Occlusion and stenosis of bilateral carotid arteries: Secondary | ICD-10-CM | POA: Diagnosis not present

## 2019-01-17 DIAGNOSIS — E119 Type 2 diabetes mellitus without complications: Secondary | ICD-10-CM | POA: Diagnosis not present

## 2019-01-17 DIAGNOSIS — E890 Postprocedural hypothyroidism: Secondary | ICD-10-CM | POA: Diagnosis not present

## 2019-01-17 DIAGNOSIS — D649 Anemia, unspecified: Secondary | ICD-10-CM | POA: Diagnosis not present

## 2019-01-17 DIAGNOSIS — E785 Hyperlipidemia, unspecified: Secondary | ICD-10-CM | POA: Diagnosis not present

## 2019-01-21 DIAGNOSIS — E119 Type 2 diabetes mellitus without complications: Secondary | ICD-10-CM | POA: Diagnosis not present

## 2019-01-21 DIAGNOSIS — E785 Hyperlipidemia, unspecified: Secondary | ICD-10-CM | POA: Diagnosis not present

## 2019-01-21 DIAGNOSIS — I10 Essential (primary) hypertension: Secondary | ICD-10-CM | POA: Diagnosis not present

## 2019-01-21 DIAGNOSIS — E038 Other specified hypothyroidism: Secondary | ICD-10-CM | POA: Diagnosis not present

## 2019-01-21 DIAGNOSIS — E1121 Type 2 diabetes mellitus with diabetic nephropathy: Secondary | ICD-10-CM | POA: Diagnosis not present

## 2019-01-22 DIAGNOSIS — L821 Other seborrheic keratosis: Secondary | ICD-10-CM | POA: Diagnosis not present

## 2019-01-28 ENCOUNTER — Ambulatory Visit (HOSPITAL_COMMUNITY)
Admission: RE | Admit: 2019-01-28 | Discharge: 2019-01-28 | Disposition: A | Discharge status: Home / Self Care | Payer: Medicare Other | Source: Ambulatory Visit | Attending: Internal Medicine | Admitting: Internal Medicine

## 2019-01-28 ENCOUNTER — Inpatient Hospital Stay: Payer: Medicare Other | Attending: Internal Medicine

## 2019-01-28 ENCOUNTER — Encounter (HOSPITAL_COMMUNITY): Payer: Self-pay

## 2019-01-28 ENCOUNTER — Other Ambulatory Visit: Payer: Self-pay

## 2019-01-28 ENCOUNTER — Other Ambulatory Visit: Payer: Self-pay | Admitting: Internal Medicine

## 2019-01-28 DIAGNOSIS — C3412 Malignant neoplasm of upper lobe, left bronchus or lung: Secondary | ICD-10-CM | POA: Diagnosis not present

## 2019-01-28 DIAGNOSIS — I10 Essential (primary) hypertension: Secondary | ICD-10-CM | POA: Diagnosis not present

## 2019-01-28 DIAGNOSIS — Z9012 Acquired absence of left breast and nipple: Secondary | ICD-10-CM | POA: Insufficient documentation

## 2019-01-28 DIAGNOSIS — Z885 Allergy status to narcotic agent status: Secondary | ICD-10-CM | POA: Diagnosis not present

## 2019-01-28 DIAGNOSIS — Z923 Personal history of irradiation: Secondary | ICD-10-CM | POA: Diagnosis not present

## 2019-01-28 DIAGNOSIS — I2510 Atherosclerotic heart disease of native coronary artery without angina pectoris: Secondary | ICD-10-CM | POA: Insufficient documentation

## 2019-01-28 DIAGNOSIS — I700 Atherosclerosis of aorta: Secondary | ICD-10-CM | POA: Insufficient documentation

## 2019-01-28 DIAGNOSIS — Z888 Allergy status to other drugs, medicaments and biological substances status: Secondary | ICD-10-CM | POA: Insufficient documentation

## 2019-01-28 DIAGNOSIS — C3490 Malignant neoplasm of unspecified part of unspecified bronchus or lung: Secondary | ICD-10-CM

## 2019-01-28 DIAGNOSIS — Z79899 Other long term (current) drug therapy: Secondary | ICD-10-CM | POA: Insufficient documentation

## 2019-01-28 LAB — CBC WITH DIFFERENTIAL (CANCER CENTER ONLY)
Abs Immature Granulocytes: 0.01 10*3/uL (ref 0.00–0.07)
Basophils Absolute: 0 10*3/uL (ref 0.0–0.1)
Basophils Relative: 0 %
Eosinophils Absolute: 0.1 10*3/uL (ref 0.0–0.5)
Eosinophils Relative: 1 %
HCT: 32.4 % — ABNORMAL LOW (ref 36.0–46.0)
Hemoglobin: 10.5 g/dL — ABNORMAL LOW (ref 12.0–15.0)
Immature Granulocytes: 0 %
Lymphocytes Relative: 25 %
Lymphs Abs: 1.5 10*3/uL (ref 0.7–4.0)
MCH: 27.8 pg (ref 26.0–34.0)
MCHC: 32.4 g/dL (ref 30.0–36.0)
MCV: 85.7 fL (ref 80.0–100.0)
Monocytes Absolute: 0.4 10*3/uL (ref 0.1–1.0)
Monocytes Relative: 6 %
Neutro Abs: 4 10*3/uL (ref 1.7–7.7)
Neutrophils Relative %: 68 %
Platelet Count: 318 10*3/uL (ref 150–400)
RBC: 3.78 MIL/uL — ABNORMAL LOW (ref 3.87–5.11)
RDW: 14.2 % (ref 11.5–15.5)
WBC Count: 6 10*3/uL (ref 4.0–10.5)
nRBC: 0 % (ref 0.0–0.2)

## 2019-01-28 LAB — CMP (CANCER CENTER ONLY)
ALT: 28 U/L (ref 0–44)
AST: 21 U/L (ref 15–41)
Albumin: 3.7 g/dL (ref 3.5–5.0)
Alkaline Phosphatase: 71 U/L (ref 38–126)
Anion gap: 13 (ref 5–15)
BUN: 17 mg/dL (ref 8–23)
CO2: 26 mmol/L (ref 22–32)
Calcium: 9.1 mg/dL (ref 8.9–10.3)
Chloride: 103 mmol/L (ref 98–111)
Creatinine: 1.26 mg/dL — ABNORMAL HIGH (ref 0.44–1.00)
GFR, Est AFR Am: 48 mL/min — ABNORMAL LOW (ref >60)
GFR, Estimated: 41 mL/min — ABNORMAL LOW (ref >60)
Glucose, Bld: 140 mg/dL — ABNORMAL HIGH (ref 70–99)
Potassium: 4 mmol/L (ref 3.5–5.1)
Sodium: 142 mmol/L (ref 135–145)
Total Bilirubin: 0.3 mg/dL (ref 0.3–1.2)
Total Protein: 7.8 g/dL (ref 6.5–8.1)

## 2019-01-28 MED ORDER — SODIUM CHLORIDE (PF) 0.9 % IJ SOLN
INTRAMUSCULAR | Status: AC
  Filled 2019-01-28: qty 50

## 2019-01-28 MED ORDER — IOHEXOL 300 MG/ML  SOLN
75.0000 mL | Freq: Once | INTRAMUSCULAR | Status: AC | PRN
  Administered 2019-01-28: 75 mL via INTRAVENOUS

## 2019-01-30 ENCOUNTER — Encounter: Payer: Self-pay | Admitting: Internal Medicine

## 2019-01-30 ENCOUNTER — Inpatient Hospital Stay (HOSPITAL_BASED_OUTPATIENT_CLINIC_OR_DEPARTMENT_OTHER): Payer: Medicare Other | Admitting: Internal Medicine

## 2019-01-30 ENCOUNTER — Other Ambulatory Visit: Payer: Self-pay

## 2019-01-30 VITALS — BP 165/77 | HR 81 | Temp 98.5°F | Resp 17 | Ht 61.0 in | Wt 156.8 lb

## 2019-01-30 DIAGNOSIS — C3490 Malignant neoplasm of unspecified part of unspecified bronchus or lung: Secondary | ICD-10-CM | POA: Diagnosis not present

## 2019-01-30 DIAGNOSIS — Z79899 Other long term (current) drug therapy: Secondary | ICD-10-CM | POA: Diagnosis not present

## 2019-01-30 DIAGNOSIS — I10 Essential (primary) hypertension: Secondary | ICD-10-CM

## 2019-01-30 DIAGNOSIS — C3492 Malignant neoplasm of unspecified part of left bronchus or lung: Secondary | ICD-10-CM | POA: Diagnosis not present

## 2019-01-30 DIAGNOSIS — I6529 Occlusion and stenosis of unspecified carotid artery: Secondary | ICD-10-CM

## 2019-01-30 DIAGNOSIS — C3412 Malignant neoplasm of upper lobe, left bronchus or lung: Secondary | ICD-10-CM | POA: Diagnosis not present

## 2019-01-30 DIAGNOSIS — Z923 Personal history of irradiation: Secondary | ICD-10-CM | POA: Diagnosis not present

## 2019-01-30 DIAGNOSIS — I2510 Atherosclerotic heart disease of native coronary artery without angina pectoris: Secondary | ICD-10-CM | POA: Diagnosis not present

## 2019-01-30 DIAGNOSIS — I700 Atherosclerosis of aorta: Secondary | ICD-10-CM | POA: Diagnosis not present

## 2019-01-30 NOTE — Progress Notes (Signed)
[facility] [phone]   Fax:([phone]) [place], MD [address] [place] [address]  DIAGNOSIS:  stage IA (T1 a, N0, M0) non-small cell lung cancer, adenocarcinoma presented with left upper lobe lung nodule.  PRIOR THERAPY: status post left upper lobectomy with lymph node dissection under the care of Dr. Roxan Hockey on June 04, 2017.  CURRENT THERAPY: Observation.  INTERVAL HISTORY: Karen West 78 y.o. female returns to the clinic today for 6 months follow-up visit.  The patient is feeling fine today with no concerning complaints except for bruising in her right arm after several trials to start IV for the contrast before her scan.  The patient denied having any current chest pain, shortness of breath, cough or hemoptysis.  She denied having any fever or chills.  She has no nausea, vomiting, diarrhea or constipation.  She denied having any headache or visual changes.  She is here today for evaluation with repeat CT scan of the chest for restaging of her disease.   MEDICAL HISTORY: Past Medical History:  Diagnosis Date  . Atherosclerosis of aorta ([place])    a. 01/2017/03/2017 - noted on high res chest CTs.  . Breast cancer ([place])    a. Bilateral --> s/p left mastectomy  . Carotid artery disease ([place])    a. 08/5782 w/ 69-62% LICA stenosis and <95% RICA stenosis; b. 10/2015 Carotid U/S: < 50% BICA stenosis  . Chest pain    a. 09/2011 MV: EF 68%, no ischemia/infarct.  . Chronic anemia   . Chronic headaches    denies  . Coronary artery calcification seen on CT scan    a. 01/2017 High res CT: atherosclerotic calcification of the arterial vascularture, including severe involvement of the coronary arteries; b. 03/2017 CT Chest: coronary and Ao atheroscelrosis.  Marland Kitchen GERD (gastroesophageal reflux disease)   . History of echocardiogram    a. 09/2011 Echo: EF 55-60%, no rwma, triv AI, PASP 65mmHg.  Marland Kitchen  Hyperlipidemia   . Hypertension   . Hyperthyroidism   . Left upper lobe pulmonary nodule    a. 02/2017 PET: slowing enlarging 1.7cm LUL nodule w/ low-grade metabolic activity; b. 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 05/2017 s/p VATS.  . Multinodular goiter    a. 02/2017 PET scan- Hypermetabolic nodule;  b. 04/4399 s/p thyroidectomy  . Obesity   . OSA on CPAP    cpap  . Osteoarthritis   . Personal history of radiation therapy 1999  . Right bundle branch block   . 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  MEDICATIONS:  Current Outpatient Medications  Medication Sig Dispense Refill  . acetaminophen (TYLENOL) 500 MG tablet Take 1,000 mg by mouth every 6 (six) hours as needed for moderate pain or headache.    Marland Kitchen amLODipine (NORVASC) 10 MG tablet Take 10 mg by mouth daily.    Marland Kitchen aspirin 81 MG tablet Take 81 mg by mouth daily.      Marland Kitchen ezetimibe (ZETIA) 10 MG tablet Take 5 mg by mouth daily.     . ferrous sulfate 325 (65 FE) MG tablet Take 325 mg by mouth 2 (two) times daily.    Marland Kitchen GAVILYTE-G 236 g solution     . glipiZIDE (GLUCOTROL) 10 MG 24 hr tablet Take 10 mg by mouth 2 (two) times daily.      Marland Kitchen  hydrocortisone cream 1 % Apply 1 application topically 2 (two) times daily.    Marland Kitchen levothyroxine (SYNTHROID, LEVOTHROID) 88 MCG tablet Take 88 mcg by mouth daily.  5  . Magnesium Oxide 400 (240 Mg) MG TABS Take 1 tablet by mouth 2 (two) times daily.     . metFORMIN (GLUCOPHAGE) 1000 MG tablet Take 1,000 mg by mouth 2 (two) times daily.     . NONFORMULARY OR COMPOUNDED ITEM [facility]:  Achilles Tendonitis #12 - Diclofenac 3%, Baclofen 2%, Bupivacaine 1%, Gabapentin 6%, Pentoxifylline 3%, Topiramate 1%. Apply 1-2 grams to affected area 3-4 times daily. 100 each 5  . Omega-3 Fatty Acids (FISH OIL) 1200 MG CAPS Take 1,200 mg by mouth 2 (two) times daily.      Marland Kitchen omeprazole (PRILOSEC) 20 MG capsule Take 20 mg by mouth 2 (two) times daily. Takes prn    . rosuvastatin (CRESTOR) 20 MG tablet Take 20 mg by mouth daily.     No current facility-administered medications for this visit.     SURGICAL HISTORY:  Past Surgical History:  Procedure Laterality Date  . BREAST BIOPSY  1993; 1995; 2000   left; right; left  . BREAST EXCISIONAL BIOPSY Right pt unsure  . MASTECTOMY Left 2000   left  . THYROIDECTOMY  05/10/2017   VIDEO BRONCHOSCOPY WITH ENDOBRONCHIAL NAVIGATION (N/A)  . THYROIDECTOMY N/A 05/10/2017   Procedure: TOTAL THYROIDECTOMY;  Surgeon: Armandina Gemma, MD;  Location: [facility];  Service: General;  Laterality: N/A;  . VIDEO ASSISTED THORACOSCOPY (VATS)/ LOBECTOMY Left 06/04/2017   Procedure: VIDEO ASSISTED THORACOSCOPY (VATS)/LEFT UPPER LOBECTOMY;  Surgeon: [name], MD;  Location: [facility];  Service: Thoracic;  Laterality: Left;  Marland Kitchen VIDEO BRONCHOSCOPY WITH ENDOBRONCHIAL NAVIGATION N/A 05/10/2017   Procedure: VIDEO BRONCHOSCOPY WITH ENDOBRONCHIAL NAVIGATION;  Surgeon: [name], MD;  Location: [facility];  Service: Thoracic;  Laterality: N/A;  . VIDEO BRONCHOSCOPY WITH ENDOBRONCHIAL ULTRASOUND N/A 06/04/2017   Procedure: VIDEO BRONCHOSCOPY WITH ENDOBRONCHIAL ULTRASOUND;  Surgeon: [name], MD;  Location: [facility];  Service: Thoracic;  Laterality: N/A;    REVIEW OF SYSTEMS:  A comprehensive review of systems was negative.   PHYSICAL EXAMINATION: General appearance: alert, cooperative and no distress Head: Normocephalic, without obvious abnormality, atraumatic Neck: no adenopathy, no JVD, supple, symmetrical, trachea midline and thyroid not enlarged, symmetric, no tenderness/mass/nodules Lymph nodes: Cervical, supraclavicular, and axillary nodes normal. Resp: clear to auscultation bilaterally Back: symmetric, no curvature. ROM normal. No CVA tenderness. Cardio: regular rate and rhythm, S1, S2 normal, no murmur, click, rub or  gallop GI: soft, non-tender; bowel sounds normal; no masses,  no organomegaly Extremities: extremities normal, atraumatic, no cyanosis or edema  ECOG PERFORMANCE STATUS: 1 - Symptomatic but completely ambulatory  Blood pressure (!) 165/77, pulse 81, temperature 98.5 F (36.9 C), temperature source Temporal, resp. rate 17, height 5\' 1"  (1.549 m), weight 156 lb 12.8 oz (71.1 kg), SpO2 100 %.  LABORATORY DATA: Lab Results  Component Value Date   WBC 6.0 01/28/2019   HGB 10.5 (L) 01/28/2019   HCT 32.4 (L) 01/28/2019   MCV 85.7 01/28/2019   PLT 318 01/28/2019      Chemistry      Component Value Date/Time   NA 142 01/28/2019 0924   K 4.0 01/28/2019 0924   CL 103 01/28/2019 0924   CO2 26 01/28/2019 0924   BUN 17 01/28/2019 0924   CREATININE 1.26 (H) 01/28/2019 0924   CREATININE 1.16 (H) 09/11/2017 1241  Component Value Date/Time   CALCIUM 9.1 01/28/2019 0924   ALKPHOS 71 01/28/2019 0924   AST 21 01/28/2019 0924   ALT 28 01/28/2019 0924   BILITOT 0.3 01/28/2019 0924       RADIOGRAPHIC STUDIES: Ct Chest Wo Contrast  Result Date: 01/28/2019 CLINICAL DATA:  Non-small cell lung cancer staging. History of prior left upper lobe lobectomy. Prior history of left breast cancer also. EXAM: CT CHEST WITHOUT CONTRAST TECHNIQUE: Multidetector CT imaging of the chest was performed following the standard protocol without IV contrast. COMPARISON:  Chest CT 07/26/2018 FINDINGS: Cardiovascular: The heart is normal in size and stable. Small amount of pericardial fluid but no effusion. Stable advanced atherosclerotic calcifications involving the thoracic aorta and coronary arteries. There also dense calcifications at the mitral valve annulus. Mediastinum/Nodes: No enlarged mediastinal or hilar lymph nodes. Small scattered lymph nodes are stable and measure less than 8 mm. Lungs/Pleura: Stable surgical changes from a left upper lobe lobectomy. No findings suspicious for residual or recurrent  tumor. No new pulmonary nodules to suggest pulmonary metastatic disease. No acute overlying pulmonary process. No pleural effusions. Upper Abdomen: No significant upper abdominal findings. Musculoskeletal: Stable surgical changes from a left mastectomy and right lumpectomy. No axillary or supraclavicular adenopathy. The bony thorax is intact. No lytic or sclerotic bone lesions. Stable degenerative changes involving the thoracic spine. IMPRESSION: 1. Stable surgical changes from a left upper lobe lobectomy. No findings suspicious for residual or recurrent tumor and no adenopathy or metastatic pulmonary nodules. 2. No acute pulmonary findings. 3. Stable advanced atherosclerotic calcifications involving the aorta and coronary arteries. Aortic Atherosclerosis (ICD10-I70.0). Electronically Signed   By: Marijo Sanes M.D.   On: 01/28/2019 13:46    ASSESSMENT AND PLAN: This is a very pleasant 78 years old African-American female with a stage Ia non-small cell lung cancer, adenocarcinoma status post left upper lobectomy with lymph node dissection in March 2019 under the care of Dr. Roxan Hockey. The patient is currently on observation and she is feeling fine with no concerning complaints. She had repeat CT scan of the chest performed recently.  I personally and independently reviewed the scans and discussed the results with the patient today. Her scan showed no concerning findings for disease recurrence or metastasis. I recommended for her to continue on observation with repeat CT scan of the chest in 6 months. For hypertension she was strongly advised to take her blood pressure medication as prescribed and to consult with her primary care physician for adjustment of her medication if needed. She was advised to call immediately if she has any concerning symptoms in the interval. The patient voices understanding of current disease status and treatment options and is in agreement with the current care plan. All  questions were answered. The patient knows to call the clinic with any problems, questions or concerns. We can certainly see the patient much sooner if necessary.  I spent 10 minutes counseling the patient face to face. The total time spent in the appointment was 15 minutes.  Disclaimer: This note was dictated with voice recognition software. Similar sounding words can inadvertently be transcribed and may not be corrected upon review.

## 2019-01-31 ENCOUNTER — Telehealth: Payer: Self-pay | Admitting: Internal Medicine

## 2019-01-31 NOTE — Telephone Encounter (Signed)
Scheduled per los. Called and left msg. Mailed printout  °

## 2019-02-11 ENCOUNTER — Ambulatory Visit: Payer: Medicare Other | Admitting: Podiatry

## 2019-02-17 DIAGNOSIS — E038 Other specified hypothyroidism: Secondary | ICD-10-CM | POA: Diagnosis not present

## 2019-02-17 DIAGNOSIS — I10 Essential (primary) hypertension: Secondary | ICD-10-CM | POA: Diagnosis not present

## 2019-02-17 DIAGNOSIS — E1121 Type 2 diabetes mellitus with diabetic nephropathy: Secondary | ICD-10-CM | POA: Diagnosis not present

## 2019-02-18 DIAGNOSIS — E119 Type 2 diabetes mellitus without complications: Secondary | ICD-10-CM | POA: Diagnosis not present

## 2019-02-18 DIAGNOSIS — E038 Other specified hypothyroidism: Secondary | ICD-10-CM | POA: Diagnosis not present

## 2019-02-18 DIAGNOSIS — E785 Hyperlipidemia, unspecified: Secondary | ICD-10-CM | POA: Diagnosis not present

## 2019-02-18 DIAGNOSIS — E1121 Type 2 diabetes mellitus with diabetic nephropathy: Secondary | ICD-10-CM | POA: Diagnosis not present

## 2019-02-18 DIAGNOSIS — I10 Essential (primary) hypertension: Secondary | ICD-10-CM | POA: Diagnosis not present

## 2019-03-11 ENCOUNTER — Encounter: Payer: Self-pay | Admitting: Podiatry

## 2019-03-11 ENCOUNTER — Other Ambulatory Visit: Payer: Self-pay

## 2019-03-11 ENCOUNTER — Ambulatory Visit (INDEPENDENT_AMBULATORY_CARE_PROVIDER_SITE_OTHER): Payer: Medicare Other | Admitting: Podiatry

## 2019-03-11 DIAGNOSIS — E0840 Diabetes mellitus due to underlying condition with diabetic neuropathy, unspecified: Secondary | ICD-10-CM

## 2019-03-11 DIAGNOSIS — M792 Neuralgia and neuritis, unspecified: Secondary | ICD-10-CM

## 2019-03-11 DIAGNOSIS — M79675 Pain in left toe(s): Secondary | ICD-10-CM

## 2019-03-11 DIAGNOSIS — M79674 Pain in right toe(s): Secondary | ICD-10-CM

## 2019-03-11 DIAGNOSIS — B351 Tinea unguium: Secondary | ICD-10-CM

## 2019-03-11 NOTE — Patient Instructions (Signed)
Diabetes Mellitus and Foot Care Foot care is an important part of your health, especially when you have diabetes. Diabetes may cause you to have problems because of poor blood flow (circulation) to your feet and legs, which can cause your skin to:  Become thinner and drier.  Break more easily.  Heal more slowly.  Peel and crack. You may also have nerve damage (neuropathy) in your legs and feet, causing decreased feeling in them. This means that you may not notice minor injuries to your feet that could lead to more serious problems. Noticing and addressing any potential problems early is the best way to prevent future foot problems. How to care for your feet Foot hygiene  Wash your feet daily with warm water and mild soap. Do not use hot water. Then, pat your feet and the areas between your toes until they are completely dry. Do not soak your feet as this can dry your skin.  Trim your toenails straight across. Do not dig under them or around the cuticle. File the edges of your nails with an emery board or nail file.  Apply a moisturizing lotion or petroleum jelly to the skin on your feet and to dry, brittle toenails. Use lotion that does not contain alcohol and is unscented. Do not apply lotion between your toes. Shoes and socks  Wear clean socks or stockings every day. Make sure they are not too tight. Do not wear knee-high stockings since they may decrease blood flow to your legs.  Wear shoes that fit properly and have enough cushioning. Always look in your shoes before you put them on to be sure there are no objects inside.  To break in new shoes, wear them for just a few hours a day. This prevents injuries on your feet. Wounds, scrapes, corns, and calluses  Check your feet daily for blisters, cuts, bruises, sores, and redness. If you cannot see the bottom of your feet, use a mirror or ask someone for help.  Do not cut corns or calluses or try to remove them with medicine.  If you  find a minor scrape, cut, or break in the skin on your feet, keep it and the skin around it clean and dry. You may clean these areas with mild soap and water. Do not clean the area with peroxide, alcohol, or iodine.  If you have a wound, scrape, corn, or callus on your foot, look at it several times a day to make sure it is healing and not infected. Check for: ? Redness, swelling, or pain. ? Fluid or blood. ? Warmth. ? Pus or a bad smell. General instructions  Do not cross your legs. This may decrease blood flow to your feet.  Do not use heating pads or hot water bottles on your feet. They may burn your skin. If you have lost feeling in your feet or legs, you may not know this is happening until it is too late.  Protect your feet from hot and cold by wearing shoes, such as at the beach or on hot pavement.  Schedule a complete foot exam at least once a year (annually) or more often if you have foot problems. If you have foot problems, report any cuts, sores, or bruises to your health care provider immediately. Contact a health care provider if:  You have a medical condition that increases your risk of infection and you have any cuts, sores, or bruises on your feet.  You have an injury that is not   healing.  You have redness on your legs or feet.  You feel burning or tingling in your legs or feet.  You have pain or cramps in your legs and feet.  Your legs or feet are numb.  Your feet always feel cold.  You have pain around a toenail. Get help right away if:  You have a wound, scrape, corn, or callus on your foot and: ? You have pain, swelling, or redness that gets worse. ? You have fluid or blood coming from the wound, scrape, corn, or callus. ? Your wound, scrape, corn, or callus feels warm to the touch. ? You have pus or a bad smell coming from the wound, scrape, corn, or callus. ? You have a fever. ? You have a red line going up your leg. Summary  Check your feet every day  for cuts, sores, red spots, swelling, and blisters.  Moisturize feet and legs daily.  Wear shoes that fit properly and have enough cushioning.  If you have foot problems, report any cuts, sores, or bruises to your health care provider immediately.  Schedule a complete foot exam at least once a year (annually) or more often if you have foot problems. This information is not intended to replace advice given to you by your health care provider. Make sure you discuss any questions you have with your health care provider. Document Released: 03/03/2000 Document Revised: 04/18/2017 Document Reviewed: 04/07/2016 Elsevier Patient Education  2020 [employer]vier Inc.  

## 2019-03-17 NOTE — Progress Notes (Signed)
Subjective: Karen West is a 78 y.o. y.o. female with h/o diabetes and neuropathic pain who presents today for preventative diabetic foot care. Patient has painful, elongated mycotic toenails which interfere with daily activities. Pain is aggravated when wearing enclosed shoe gear and relieved with periodic professional debridement.  She has brought in a jar of Pedifix Deep Healing Foot Cream given to her by her sister and wants to know if this will help with her neuropathy pain.  She has Rx for compounded Neuropathy Pain Cream from [place], but has not been using it consistently.  Deland Pretty, MD is patient's PCP. Last visit was 10/17/2018.  Medications reviewed in chart.  Allergies  Allergen Reactions  . Tramadol Nausea And Vomiting and Other (See Comments)    "got sick to my stomach and was throwing up blood; it put me in the hospital")  . Lisinopril Rash and Other (See Comments)    Renal failure   . Tapazole [Methimazole] Itching and Rash    Objective: There were no vitals filed for this visit.  Vascular Examination: Capillary refill time to digits <3 seconds b/l.  Dorsalis pedis pulses faintly palpable b/l.  Posterior tibial pulses faintly palpable b/l.  Digital hair present b/l.  Skin temperature gradient WNL b/l.  Dermatological Examination: Skin with normal turgor, texture and tone b/l.  Toenails 1-5 b/l discolored, thick, dystrophic with subungual debris and pain with palpation to nailbeds due to thickness of nails.  Musculoskeletal: Muscle strength 5/5 to all LE muscle groups b/l.  Neurological: Sensation intact 5/5 b/l with 10 gram monofilament.  Vibratory sensation diminished b/l.  Assessment: 1. Painful onychomycosis toenails 1-5 b/l 2.  Neuropathic foot pain b/l 4.  NIDDM  Plan: 1. Continue diabetic foot care principles. Literature dispensed on today. 2. Karen West was informed Pedifix Deep Healing Foot Cream was not designed  for neuropathic pain. 3. She was instructed to increase her compounded [facility]  Neuropathy Cream to twice daily. She related understanding. 4. Toenails 1-5 b/l were debrided in length and girth without iatrogenic bleeding. 5. Patient to continue soft, supportive shoe gear daily. 6. Patient to report any pedal injuries to medical professional immediately. 7. Follow up 3 months.  8. Patient/POA to call should there be a concern in the interim.

## 2019-03-24 ENCOUNTER — Telehealth: Payer: Self-pay | Admitting: Internal Medicine

## 2019-03-24 NOTE — Telephone Encounter (Signed)
Returned patient's phone call regarding rescheduling lab appointment time for CT scan, per patient's request appointment time has been scheduled for May.

## 2019-03-25 DIAGNOSIS — H25013 Cortical age-related cataract, bilateral: Secondary | ICD-10-CM | POA: Diagnosis not present

## 2019-03-25 DIAGNOSIS — H2513 Age-related nuclear cataract, bilateral: Secondary | ICD-10-CM | POA: Diagnosis not present

## 2019-03-25 DIAGNOSIS — H2589 Other age-related cataract: Secondary | ICD-10-CM | POA: Diagnosis not present

## 2019-03-25 DIAGNOSIS — H2512 Age-related nuclear cataract, left eye: Secondary | ICD-10-CM | POA: Diagnosis not present

## 2019-03-25 DIAGNOSIS — H40013 Open angle with borderline findings, low risk, bilateral: Secondary | ICD-10-CM | POA: Diagnosis not present

## 2019-03-28 ENCOUNTER — Ambulatory Visit: Payer: Medicare Other | Admitting: Podiatry

## 2019-04-03 ENCOUNTER — Ambulatory Visit: Payer: Medicare Other | Attending: Internal Medicine

## 2019-04-03 DIAGNOSIS — Z20822 Contact with and (suspected) exposure to covid-19: Secondary | ICD-10-CM | POA: Diagnosis not present

## 2019-04-04 LAB — NOVEL CORONAVIRUS, NAA: SARS-CoV-2, NAA: NOT DETECTED

## 2019-04-10 ENCOUNTER — Telehealth: Payer: Self-pay | Admitting: General Practice

## 2019-04-10 NOTE — Telephone Encounter (Signed)
Negative COVID results given. Patient results "NOT Detected." Caller expressed understanding. ° °

## 2019-04-16 DIAGNOSIS — H25812 Combined forms of age-related cataract, left eye: Secondary | ICD-10-CM | POA: Diagnosis not present

## 2019-04-16 DIAGNOSIS — H2512 Age-related nuclear cataract, left eye: Secondary | ICD-10-CM | POA: Diagnosis not present

## 2019-04-21 DIAGNOSIS — H25011 Cortical age-related cataract, right eye: Secondary | ICD-10-CM | POA: Diagnosis not present

## 2019-04-21 DIAGNOSIS — H2511 Age-related nuclear cataract, right eye: Secondary | ICD-10-CM | POA: Diagnosis not present

## 2019-04-29 DIAGNOSIS — M8580 Other specified disorders of bone density and structure, unspecified site: Secondary | ICD-10-CM | POA: Diagnosis not present

## 2019-04-29 DIAGNOSIS — M8589 Other specified disorders of bone density and structure, multiple sites: Secondary | ICD-10-CM | POA: Diagnosis not present

## 2019-04-30 DIAGNOSIS — H25011 Cortical age-related cataract, right eye: Secondary | ICD-10-CM | POA: Diagnosis not present

## 2019-04-30 DIAGNOSIS — H2511 Age-related nuclear cataract, right eye: Secondary | ICD-10-CM | POA: Diagnosis not present

## 2019-04-30 DIAGNOSIS — H25811 Combined forms of age-related cataract, right eye: Secondary | ICD-10-CM | POA: Diagnosis not present

## 2019-05-29 ENCOUNTER — Ambulatory Visit: Payer: Medicare Other

## 2019-05-29 DIAGNOSIS — E038 Other specified hypothyroidism: Secondary | ICD-10-CM | POA: Diagnosis not present

## 2019-05-29 DIAGNOSIS — I10 Essential (primary) hypertension: Secondary | ICD-10-CM | POA: Diagnosis not present

## 2019-05-29 DIAGNOSIS — E119 Type 2 diabetes mellitus without complications: Secondary | ICD-10-CM | POA: Diagnosis not present

## 2019-05-29 DIAGNOSIS — E785 Hyperlipidemia, unspecified: Secondary | ICD-10-CM | POA: Diagnosis not present

## 2019-05-30 DIAGNOSIS — Z23 Encounter for immunization: Secondary | ICD-10-CM | POA: Diagnosis not present

## 2019-06-03 DIAGNOSIS — E038 Other specified hypothyroidism: Secondary | ICD-10-CM | POA: Diagnosis not present

## 2019-06-03 DIAGNOSIS — E1121 Type 2 diabetes mellitus with diabetic nephropathy: Secondary | ICD-10-CM | POA: Diagnosis not present

## 2019-06-03 DIAGNOSIS — I10 Essential (primary) hypertension: Secondary | ICD-10-CM | POA: Diagnosis not present

## 2019-06-03 DIAGNOSIS — E782 Mixed hyperlipidemia: Secondary | ICD-10-CM | POA: Diagnosis not present

## 2019-06-19 DIAGNOSIS — I219 Acute myocardial infarction, unspecified: Secondary | ICD-10-CM

## 2019-06-19 HISTORY — DX: Acute myocardial infarction, unspecified: I21.9

## 2019-06-23 ENCOUNTER — Ambulatory Visit: Payer: Medicare Other

## 2019-06-27 DIAGNOSIS — Z23 Encounter for immunization: Secondary | ICD-10-CM | POA: Diagnosis not present

## 2019-07-02 ENCOUNTER — Emergency Department (HOSPITAL_COMMUNITY): Payer: Medicare Other

## 2019-07-02 ENCOUNTER — Other Ambulatory Visit: Payer: Self-pay

## 2019-07-02 ENCOUNTER — Encounter (HOSPITAL_COMMUNITY): Payer: Self-pay

## 2019-07-02 ENCOUNTER — Inpatient Hospital Stay (HOSPITAL_COMMUNITY)
Admission: EM | Admit: 2019-07-02 | Discharge: 2019-07-05 | DRG: 246 | Disposition: A | Discharge status: Home / Self Care | Payer: Medicare Other | Attending: Cardiovascular Disease | Admitting: Cardiovascular Disease

## 2019-07-02 DIAGNOSIS — E1169 Type 2 diabetes mellitus with other specified complication: Secondary | ICD-10-CM | POA: Diagnosis present

## 2019-07-02 DIAGNOSIS — Z20822 Contact with and (suspected) exposure to covid-19: Secondary | ICD-10-CM | POA: Diagnosis not present

## 2019-07-02 DIAGNOSIS — E785 Hyperlipidemia, unspecified: Secondary | ICD-10-CM | POA: Diagnosis present

## 2019-07-02 DIAGNOSIS — E118 Type 2 diabetes mellitus with unspecified complications: Secondary | ICD-10-CM | POA: Diagnosis present

## 2019-07-02 DIAGNOSIS — Z79899 Other long term (current) drug therapy: Secondary | ICD-10-CM

## 2019-07-02 DIAGNOSIS — I10 Essential (primary) hypertension: Secondary | ICD-10-CM | POA: Diagnosis not present

## 2019-07-02 DIAGNOSIS — E038 Other specified hypothyroidism: Secondary | ICD-10-CM | POA: Diagnosis not present

## 2019-07-02 DIAGNOSIS — E890 Postprocedural hypothyroidism: Secondary | ICD-10-CM | POA: Diagnosis present

## 2019-07-02 DIAGNOSIS — I214 Non-ST elevation (NSTEMI) myocardial infarction: Principal | ICD-10-CM | POA: Diagnosis present

## 2019-07-02 DIAGNOSIS — Z8249 Family history of ischemic heart disease and other diseases of the circulatory system: Secondary | ICD-10-CM

## 2019-07-02 DIAGNOSIS — Z923 Personal history of irradiation: Secondary | ICD-10-CM

## 2019-07-02 DIAGNOSIS — I2121 ST elevation (STEMI) myocardial infarction involving left circumflex coronary artery: Secondary | ICD-10-CM | POA: Diagnosis present

## 2019-07-02 DIAGNOSIS — R0789 Other chest pain: Secondary | ICD-10-CM | POA: Diagnosis not present

## 2019-07-02 DIAGNOSIS — Z833 Family history of diabetes mellitus: Secondary | ICD-10-CM

## 2019-07-02 DIAGNOSIS — Z955 Presence of coronary angioplasty implant and graft: Secondary | ICD-10-CM

## 2019-07-02 DIAGNOSIS — E7800 Pure hypercholesterolemia, unspecified: Secondary | ICD-10-CM | POA: Diagnosis present

## 2019-07-02 DIAGNOSIS — E782 Mixed hyperlipidemia: Secondary | ICD-10-CM | POA: Diagnosis not present

## 2019-07-02 DIAGNOSIS — Z9012 Acquired absence of left breast and nipple: Secondary | ICD-10-CM

## 2019-07-02 DIAGNOSIS — Z7982 Long term (current) use of aspirin: Secondary | ICD-10-CM

## 2019-07-02 DIAGNOSIS — E119 Type 2 diabetes mellitus without complications: Secondary | ICD-10-CM | POA: Diagnosis not present

## 2019-07-02 DIAGNOSIS — Z7984 Long term (current) use of oral hypoglycemic drugs: Secondary | ICD-10-CM

## 2019-07-02 DIAGNOSIS — Z803 Family history of malignant neoplasm of breast: Secondary | ICD-10-CM

## 2019-07-02 DIAGNOSIS — Z85118 Personal history of other malignant neoplasm of bronchus and lung: Secondary | ICD-10-CM

## 2019-07-02 DIAGNOSIS — E1121 Type 2 diabetes mellitus with diabetic nephropathy: Secondary | ICD-10-CM | POA: Diagnosis not present

## 2019-07-02 DIAGNOSIS — I2510 Atherosclerotic heart disease of native coronary artery without angina pectoris: Secondary | ICD-10-CM | POA: Diagnosis present

## 2019-07-02 DIAGNOSIS — K219 Gastro-esophageal reflux disease without esophagitis: Secondary | ICD-10-CM | POA: Diagnosis present

## 2019-07-02 DIAGNOSIS — Z79890 Hormone replacement therapy (postmenopausal): Secondary | ICD-10-CM

## 2019-07-02 DIAGNOSIS — R079 Chest pain, unspecified: Secondary | ICD-10-CM | POA: Diagnosis not present

## 2019-07-02 DIAGNOSIS — G4733 Obstructive sleep apnea (adult) (pediatric): Secondary | ICD-10-CM | POA: Diagnosis present

## 2019-07-02 DIAGNOSIS — Z853 Personal history of malignant neoplasm of breast: Secondary | ICD-10-CM

## 2019-07-02 LAB — CBC
HCT: 35.8 % — ABNORMAL LOW (ref 36.0–46.0)
Hemoglobin: 11.1 g/dL — ABNORMAL LOW (ref 12.0–15.0)
MCH: 26.9 pg (ref 26.0–34.0)
MCHC: 31 g/dL (ref 30.0–36.0)
MCV: 86.9 fL (ref 80.0–100.0)
Platelets: 343 10*3/uL (ref 150–400)
RBC: 4.12 MIL/uL (ref 3.87–5.11)
RDW: 14.1 % (ref 11.5–15.5)
WBC: 6.8 10*3/uL (ref 4.0–10.5)
nRBC: 0 % (ref 0.0–0.2)

## 2019-07-02 LAB — GLUCOSE, CAPILLARY
Glucose-Capillary: 60 mg/dL — ABNORMAL LOW (ref 70–99)
Glucose-Capillary: 150 mg/dL — ABNORMAL HIGH (ref 70–99)

## 2019-07-02 LAB — BASIC METABOLIC PANEL
Anion gap: 14 (ref 5–15)
BUN: 25 mg/dL — ABNORMAL HIGH (ref 8–23)
CO2: 22 mmol/L (ref 22–32)
Calcium: 9.7 mg/dL (ref 8.9–10.3)
Chloride: 104 mmol/L (ref 98–111)
Creatinine, Ser: 1.11 mg/dL — ABNORMAL HIGH (ref 0.44–1.00)
GFR calc Af Amer: 56 mL/min — ABNORMAL LOW (ref >60)
GFR calc non Af Amer: 48 mL/min — ABNORMAL LOW (ref >60)
Glucose, Bld: 108 mg/dL — ABNORMAL HIGH (ref 70–99)
Potassium: 3.5 mmol/L (ref 3.5–5.1)
Sodium: 140 mmol/L (ref 135–145)

## 2019-07-02 LAB — HEMOGLOBIN A1C
Hgb A1c MFr Bld: 7.3 % — ABNORMAL HIGH (ref 4.8–5.6)
Mean Plasma Glucose: 162.81 mg/dL

## 2019-07-02 LAB — HEPATIC FUNCTION PANEL
ALT: 32 U/L (ref 0–44)
AST: 27 U/L (ref 15–41)
Albumin: 3.5 g/dL (ref 3.5–5.0)
Alkaline Phosphatase: 71 U/L (ref 38–126)
Bilirubin, Direct: <0.1 mg/dL (ref 0.0–0.2)
Total Bilirubin: 0.9 mg/dL (ref 0.3–1.2)
Total Protein: 7.6 g/dL (ref 6.5–8.1)

## 2019-07-02 LAB — TROPONIN I (HIGH SENSITIVITY)
Troponin I (High Sensitivity): 26 ng/L — ABNORMAL HIGH (ref <18)
Troponin I (High Sensitivity): 52 ng/L — ABNORMAL HIGH (ref <18)

## 2019-07-02 LAB — SARS CORONAVIRUS 2 (TAT 6-24 HRS): SARS Coronavirus 2: NEGATIVE

## 2019-07-02 LAB — CBG MONITORING, ED: Glucose-Capillary: 98 mg/dL (ref 70–99)

## 2019-07-02 MED ORDER — SODIUM CHLORIDE 0.9 % IV SOLN: 250.0000 mL | INTRAVENOUS | Status: DC | PRN

## 2019-07-02 MED ORDER — SODIUM CHLORIDE 0.9% FLUSH
3.0000 mL | Freq: Two times a day (BID) | INTRAVENOUS | Status: DC
  Administered 2019-07-03 – 2019-07-04 (×2): 3 mL via INTRAVENOUS

## 2019-07-02 MED ORDER — ASPIRIN 81 MG PO CHEW
324.0000 mg | CHEWABLE_TABLET | Freq: Once | ORAL | Status: DC
  Filled 2019-07-02: qty 4

## 2019-07-02 MED ORDER — ASPIRIN 81 MG PO CHEW
81.0000 mg | CHEWABLE_TABLET | ORAL | Status: AC
  Administered 2019-07-03: 81 mg via ORAL
  Filled 2019-07-02: qty 1

## 2019-07-02 MED ORDER — NITROGLYCERIN 2 % TD OINT
0.5000 [in_us] | TOPICAL_OINTMENT | Freq: Four times a day (QID) | TRANSDERMAL | Status: DC
  Administered 2019-07-02 – 2019-07-04 (×4): 0.5 [in_us] via TOPICAL
  Filled 2019-07-02 (×2): qty 30

## 2019-07-02 MED ORDER — SODIUM CHLORIDE 0.9 % WEIGHT BASED INFUSION
3.0000 mL/kg/h | INTRAVENOUS | Status: DC
  Administered 2019-07-03: 3 mL/kg/h via INTRAVENOUS

## 2019-07-02 MED ORDER — EZETIMIBE 10 MG PO TABS
10.0000 mg | ORAL_TABLET | Freq: Every evening | ORAL | Status: DC
  Administered 2019-07-02 – 2019-07-04 (×3): 10 mg via ORAL
  Filled 2019-07-02 (×3): qty 1

## 2019-07-02 MED ORDER — SODIUM CHLORIDE 0.9% FLUSH: 3.0000 mL | INTRAVENOUS | Status: DC | PRN

## 2019-07-02 MED ORDER — ASPIRIN EC 81 MG PO TBEC
81.0000 mg | DELAYED_RELEASE_TABLET | Freq: Every day | ORAL | Status: DC
  Administered 2019-07-04 – 2019-07-05 (×2): 81 mg via ORAL
  Filled 2019-07-02 (×2): qty 1

## 2019-07-02 MED ORDER — METOPROLOL TARTRATE 12.5 MG HALF TABLET
12.5000 mg | ORAL_TABLET | Freq: Two times a day (BID) | ORAL | Status: DC
  Administered 2019-07-02 – 2019-07-04 (×3): 12.5 mg via ORAL
  Filled 2019-07-02 (×3): qty 1

## 2019-07-02 MED ORDER — INSULIN ASPART 100 UNIT/ML ~~LOC~~ SOLN
0.0000 [IU] | Freq: Three times a day (TID) | SUBCUTANEOUS | Status: DC
  Administered 2019-07-04: 3 [IU] via SUBCUTANEOUS
  Administered 2019-07-04: 2 [IU] via SUBCUTANEOUS
  Administered 2019-07-04: 3 [IU] via SUBCUTANEOUS
  Administered 2019-07-05: 2 [IU] via SUBCUTANEOUS
  Administered 2019-07-05: 5 [IU] via SUBCUTANEOUS

## 2019-07-02 MED ORDER — ROSUVASTATIN CALCIUM 20 MG PO TABS
20.0000 mg | ORAL_TABLET | Freq: Every evening | ORAL | Status: DC
  Administered 2019-07-02 – 2019-07-04 (×3): 20 mg via ORAL
  Filled 2019-07-02 (×3): qty 1

## 2019-07-02 MED ORDER — LEVOTHYROXINE SODIUM 88 MCG PO TABS
88.0000 ug | ORAL_TABLET | Freq: Every day | ORAL | Status: DC
  Administered 2019-07-03 – 2019-07-05 (×3): 88 ug via ORAL
  Filled 2019-07-02 (×4): qty 1

## 2019-07-02 MED ORDER — HEPARIN BOLUS VIA INFUSION
4000.0000 [IU] | Freq: Once | INTRAVENOUS | Status: AC
  Administered 2019-07-02: 4000 [IU] via INTRAVENOUS
  Filled 2019-07-02: qty 4000

## 2019-07-02 MED ORDER — ACETAMINOPHEN 325 MG PO TABS: 650.0000 mg | ORAL_TABLET | ORAL | Status: DC | PRN

## 2019-07-02 MED ORDER — ASPIRIN 81 MG PO CHEW
324.0000 mg | CHEWABLE_TABLET | ORAL | Status: AC
  Filled 2019-07-02: qty 4

## 2019-07-02 MED ORDER — AMLODIPINE BESYLATE 10 MG PO TABS
10.0000 mg | ORAL_TABLET | Freq: Every day | ORAL | Status: DC
  Administered 2019-07-02 – 2019-07-05 (×3): 10 mg via ORAL
  Filled 2019-07-02 (×3): qty 1

## 2019-07-02 MED ORDER — HEPARIN (PORCINE) 25000 UT/250ML-% IV SOLN
800.0000 [IU]/h | INTRAVENOUS | Status: DC
  Administered 2019-07-02: 800 [IU]/h via INTRAVENOUS
  Filled 2019-07-02: qty 250

## 2019-07-02 MED ORDER — ASPIRIN 81 MG PO TABS: 81.0000 mg | ORAL_TABLET | Freq: Every day | ORAL | Status: DC

## 2019-07-02 MED ORDER — SODIUM CHLORIDE 0.9 % WEIGHT BASED INFUSION
1.0000 mL/kg/h | INTRAVENOUS | Status: DC
  Administered 2019-07-03: 1 mL/kg/h via INTRAVENOUS
  Administered 2019-07-03: 250 mL via INTRAVENOUS
  Administered 2019-07-03: 100 mL/h via INTRAVENOUS

## 2019-07-02 MED ORDER — ASPIRIN 300 MG RE SUPP: 300.0000 mg | RECTAL | Status: DC

## 2019-07-02 NOTE — ED Notes (Signed)
Lab noted to add on Hepatic Function

## 2019-07-02 NOTE — ED Notes (Signed)
Cardiologist at bedside.  

## 2019-07-02 NOTE — ED Notes (Signed)
IV team ordered.

## 2019-07-02 NOTE — H&P (Addendum)
Cardiology Admission History and Physical:   Patient ID: Karen West MRN: [id number]; DOB: [birth year]   Admission date: 07/02/2019  Primary Care Provider: Deland Pretty, MD Primary Cardiologist: Minus Breeding, MD   Chief Complaint:  Chest pain   Patient Profile:   Karen West is a 79 y.o. 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   History of Present Illness:   Karen West is a 79yo F with a hx as stated above who presented to [facility] with chest pain from her PCP's office. She reports that for the last 2 weeks she has been having intermittent chest pain which is noted to be worse with exertion with associated SOB. She states that prior to two weeks ago, she was in her usual state of health with no specific health complaints. She lives alone and cares completely for herself without assistance. She knew that she had an upcoming follow up with her PCP and planned to mention her symptoms to him at that time. Unfortunately, while she was at her appointment, she had recurrent chest pain and was sent to the ED for further evaluation. She is currently chest pain free and resting comfortably.   On ED arrival, she was given ASA and was found to have a hsT at 26 with repeat at 47. CXR with no acute cardiopulmonary disease with mitral annulus calcification as well as aortic atherosclerosis. EKG with new complete RBBB and more pronounced TWI in leads V1, V3 and III.   On my assessment, the chest pain is described at a chest heaviness. She denies orthopnea symptoms although does state she has had what sounds like dependent LE edea. She has no prior hx of CAD although does have a h/o carotid artery disease with [place] from 12/2017 with 50-69%  right and left internal carotid artery stenosis. Chest CT from 01/2019 shows advanced atherosclerotic calcifications involving the thoracic aorta and coronary arteries. There also dense calcifications at the mitral valve annulus. She underwent MV stress testing in 2013 which was negative.  Echocardiogram from 05/2017 showed EF of 55-60% with elevated end-diastolic pressures with severe posterior annular calcification.   She was last seen by our service 05/2017 after a 24 beat run of NSVT. She was asymptomatic with no presyncope or syncope. Mg was found to be normal and she was placed on a beta blocker, Metoprolol 25mg  PO BID. EKG showed prolonged Qtc which was felt to be secondary to albuterol and Protonix. Echocardiogram was found to have an EF of 55-60%.   Her pulmonary hx dates back to 07/2008 when she was found to have a left upper lobe nodule found [facility] at [facility]. This was followed serially without change initially and normal PET scan in 06/2009. CT was stable in 2012, but then she was lost to follow up. She was then seen by Dr. Chase Caller in 02/2017 and reported DOE. Chest CT showed enlarging LUL lung nodule. This was followed by PET, which showed low-grade metabolic activity. She was also noted to have a hypermetabolic area in her thyroid/multinodular goiter. She was referred to thoracic and endocrine surgeries and subsequently underwent thyroidectomy and bronchoscopy in 04/2016. 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 In that setting,  she followed up with thoracic surgery and subsequently underwent video-assisted thoracoscopy and left upper lobectomy on 3/18.  She had a mildly complicated postoperative course which included left pneumothorax requiring ongoing chest tube placement.   Past Medical History:  Diagnosis Date   Atherosclerosis of aorta ([place])    a. 01/2017/03/2017 - noted on high res  chest CTs.   Breast cancer ([place])    a. Bilateral --> s/p left mastectomy   Carotid artery disease ([place])    a. 0/3474 w/ 25-95% LICA stenosis and <63% RICA stenosis; b. 10/2015 Carotid U/S: < 50% BICA stenosis   Chest pain    a. 09/2011 MV: EF 68%, no ischemia/infarct.   Chronic anemia    Chronic headaches    denies   Coronary artery calcification seen on CT scan    a. 01/2017 High res CT: atherosclerotic calcification of the arterial vascularture, including severe involvement of the coronary arteries; b. 03/2017 CT Chest: coronary and Ao atheroscelrosis.   GERD (gastroesophageal reflux disease)    History of echocardiogram    a. 09/2011 Echo: EF 55-60%, no rwma, triv AI, PASP 60mmHg.   Hyperlipidemia    Hypertension    Hyperthyroidism    Left upper lobe pulmonary nodule    a. 02/2017 PET: slowing enlarging 1.7cm LUL nodule w/ low-grade metabolic activity; b. 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 05/2017 s/p VATS.   Multinodular goiter    a. 02/2017 PET scan- Hypermetabolic nodule;  b. 05/3293 s/p thyroidectomy   Obesity    OSA on CPAP    cpap   Osteoarthritis    Personal history of radiation therapy 1999   Right bundle branch block    Type II or unspecified type diabetes mellitus without mention of complication, uncontrolled     Past Surgical History:  Procedure Laterality Date   [place]; 1995; 2000   left; right; left   BREAST EXCISIONAL BIOPSY Right pt unsure   MASTECTOMY Left 2000   left   THYROIDECTOMY  05/10/2017   VIDEO BRONCHOSCOPY WITH ENDOBRONCHIAL NAVIGATION (N/A)   THYROIDECTOMY N/A 05/10/2017   Procedure: TOTAL THYROIDECTOMY;  Surgeon: Armandina Gemma, MD;  Location: [place];  Service: General;  Laterality: N/A;   [place] (VATS)/ LOBECTOMY Left 06/04/2017   Procedure: VIDEO ASSISTED THORACOSCOPY (VATS)/LEFT UPPER LOBECTOMY;  Surgeon: [name], MD;  Location: [place];  Service: Thoracic;  Laterality: Left;   VIDEO  BRONCHOSCOPY WITH ENDOBRONCHIAL NAVIGATION N/A 05/10/2017   Procedure: VIDEO BRONCHOSCOPY WITH ENDOBRONCHIAL NAVIGATION;  Surgeon: [name], MD;  Location: [place];  Service: Thoracic;  Laterality: N/A;   VIDEO BRONCHOSCOPY WITH ENDOBRONCHIAL ULTRASOUND N/A 06/04/2017   Procedure: VIDEO BRONCHOSCOPY WITH ENDOBRONCHIAL ULTRASOUND;  Surgeon: [name], MD;  Location: [facility];  Service: Thoracic;  Laterality: N/A;     Medications Prior to Admission: Prior to Admission medications   Medication Sig Start Date End Date Taking? Authorizing Provider  acetaminophen (TYLENOL) 500 MG tablet Take 1,000 mg by mouth every 6 (six) hours as needed for moderate pain or headache.   Yes [provider]  amLODipine (NORVASC) 10 MG tablet Take 10 mg by mouth daily.   Yes [provider]  aspirin 81 MG tablet Take 81 mg by mouth daily.     Yes [provider]  Cobalamin Combinations (B-12) [id number] MCG SUBL Place 1 tablet under the tongue daily.    Yes [provider]  empagliflozin (JARDIANCE) 25 MG TABS tablet Take 25 mg by mouth  daily.   Yes [provider]  ezetimibe (ZETIA) 10 MG tablet Take 10 mg by mouth every evening.    Yes [provider]  ferrous sulfate 325 (65 FE) MG tablet Take 325 mg by mouth 2 (two) times daily. 11/04/18  Yes [provider]  hydrocortisone cream 1 % Apply 1 application topically 2 (two) times daily as needed for itching.    Yes [provider]  levothyroxine (SYNTHROID, LEVOTHROID) 88 MCG tablet Take 88 mcg by mouth daily. 07/02/17  Yes [provider]  linagliptin (TRADJENTA) 5 MG TABS tablet Take 5 mg by mouth daily.   Yes [provider]  Magnesium Oxide 400 (240 Mg) MG TABS Take 400 mg by mouth 2 (two) times daily.    Yes [provider]  metFORMIN (GLUCOPHAGE) 1000 MG tablet Take 1,000 mg by mouth 2 (two) times daily.    Yes [provider]  Omega-3 Fatty  Acids (FISH OIL) 1200 MG CAPS Take 1,200 mg by mouth 2 (two) times daily.    Yes [provider]  omeprazole (PRILOSEC) 20 MG capsule Take 20 mg by mouth 2 (two) times daily. Takes prn   Yes [provider]  rosuvastatin (CRESTOR) 20 MG tablet Take 20 mg by mouth every evening.    Yes [provider]  NONFORMULARY OR COMPOUNDED ITEM [facility]:  Achilles Tendonitis #12 - Diclofenac 3%, Baclofen 2%, Bupivacaine 1%, Gabapentin 6%, Pentoxifylline 3%, Topiramate 1%. Apply 1-2 grams to affected area 3-4 times daily. Patient not taking: Reported on 07/02/2019 05/28/18   Wallene Huh, DPM     Allergies:    Allergies  Allergen Reactions   Tramadol Nausea And Vomiting and Other (See Comments)    "got sick to my stomach and was throwing up blood; it put me in the hospital")   Lisinopril Rash and Other (See Comments)    Renal failure    Tapazole [Methimazole] Itching and Rash    Social History:   Social History   Socioeconomic History   Marital status: Widowed    Spouse name: Not on file   Number of children: Not on file   Years of education: Not on file   Highest education level: Not on file  Occupational History   Occupation: retired    Fish farm manager: RETIRED    Comment: accountant  Tobacco Use   Smoking status: Never Smoker   Smokeless tobacco: Never Used  Substance and Sexual Activity   Alcohol use: Yes    Comment: 10/04/11 "drink a wine or beeer 3-4 times/year"   Drug use: No   Sexual activity: Yes    Partners: Male    Birth control/protection: None  Other Topics Concern   Not on file  Social History Narrative   Lives alone.  Four children.    Social Determinants of Health   Financial Resource Strain:    Difficulty of Paying Living Expenses:   Food Insecurity:    Worried About Charity fundraiser in the Last Year:    Arboriculturist in the Last Year:   Transportation Needs:    Film/video editor (Medical):    Lack of Transportation  (Non-Medical):   Physical Activity:    Days of Exercise per Week:    Minutes of Exercise per Session:   Stress:    Feeling of Stress :   Social Connections:    Frequency of Communication with Friends and Family:    Frequency of Social Gatherings with Friends  and Family:    Attends Religious Services:    Active Member of Clubs or Organizations:    Attends Music therapist:    Marital Status:   Intimate Partner Violence:    Fear of Current or Ex-Partner:    Emotionally Abused:    Physically Abused:    Sexually Abused:     Family History:   The patient's family history includes Diabetes in her brother, brother, and sister; Heart attack (age of onset: 21) in her mother; Hypertension in her brother; Stroke in her brother and sister.    ROS:  Please see the history of present illness.  All other ROS reviewed and negative.     Physical Exam/Data:   Vitals:   07/02/19 1045 07/02/19 1130  BP: (!) 164/64 (!) 165/76  Pulse: 86 77  Resp: 14 16  Temp: 98.7 F (37.1 C)   TempSrc: Oral   SpO2: 100% 99%  Weight: 68 kg   Height: 5\' 2"  (1.575 m)    No intake or output data in the 24 hours ending 07/02/19 1320 Last 3 Weights 07/02/2019 01/30/2019 07/29/2018  Weight (lbs) 150 lb 156 lb 12.8 oz 152 lb 4.8 oz  Weight (kg) 68.04 kg 71.124 kg 69.083 kg     Body mass index is 27.44 kg/m.   General: Well developed, well nourished, NAD Neck: Negative for carotid bruits. No JVD Lungs:Clear to ausculation bilaterally. No wheezes, rales, or rhonchi. Breathing is unlabored. Cardiovascular: RRR with S1 S2. + murmur Abdomen: Soft, non-tender, non-distended. No obvious abdominal masses. Extremities: No edema. Radial pulses 2+ bilaterally Neuro: Alert and oriented. No focal deficits. No facial asymmetry. MAE spontaneously. Psych: Responds to questions appropriately with normal affect.     EKG:  The ECG that was done 07/02/19 was personally reviewed and demonstrates NSR with complete  RBBB (change) with TWI in leads III, V1, V3  Relevant CV Studies:  Echocardiogram 06/09/2017:  Study Conclusions   - Left ventricle: The cavity size was normal. Wall thickness was    increased in a pattern of severe LVH. Systolic function was    normal. The estimated ejection fraction was in the range of 55%    to 60%. Doppler parameters are consistent with both elevated    ventricular end-diastolic filling pressure and elevated left    atrial filling pressure.  - Aortic valve: There was mild regurgitation.  - Mitral valve: Severe posterior annular calcification.  - Atrial septum: No defect or patent foramen ovale was identified.  - Pulmonary arteries: PA peak pressure: 47 mm Hg (S).  - Pericardium, extracardiac: A trivial pericardial effusion was    identified.   Bilateral Carotid US 12/2017:  IMPRESSION: 50-69% stenosis in the right and left internal carotid arteries.     Bilateral Carotid US 12/2018:  IMPRESSION: 1. Bilateral carotid bifurcation plaque resulting in less than 50% diameter ICA stenosis. 2. Antegrade bilateral vertebral arterial flow.   Laboratory Data:  High Sensitivity Troponin:   Recent Labs  Lab 07/02/19 1047  TROPONINIHS 26*      Chemistry Recent Labs  Lab 07/02/19 1047  NA 140  K 3.5  CL 104  CO2 22  GLUCOSE 108*  BUN 25*  CREATININE 1.11*  CALCIUM 9.7  GFRNONAA 48*  GFRAA 56*  ANIONGAP 14    Recent Labs  Lab 07/02/19 1121  PROT 7.6  ALBUMIN 3.5  AST 27  ALT 32  ALKPHOS 71  BILITOT 0.9   Hematology Recent Labs  Lab 07/02/19 1047  WBC 6.8  RBC 4.12  HGB 11.1*  HCT 35.8*  MCV 86.9  MCH 26.9  MCHC 31.0  RDW 14.1  PLT 343   BNPNo results for input(s): BNP, PROBNP in the last 168 hours.  DDimer No results for input(s): DDIMER in the last 168 hours.   Radiology/Studies:  DG Chest 2 View  Result Date: 07/02/2019 CLINICAL DATA:  Chest pain EXAM: CHEST - 2 VIEW COMPARISON:  October 30, 2017 FINDINGS: Postoperative  changes noted on the left with scarring and volume loss on the left, stable. No edema or airspace opacity. Heart size and pulmonary vascularity are stable and within normal limits for postoperative change on the left. There is mitral annulus calcification as well as aortic atherosclerosis. No adenopathy evident. Surgical clips are noted in the right axillary region. No appreciable bone lesions. Surgical clips are noted in the thyroid region. IMPRESSION: Postoperative changes on left with scarring and volume loss, stable. No edema or airspace opacity. Stable cardiac silhouette. Postoperative changes noted as well in the thyroid and right axillary regions. No adenopathy. Aortic Atherosclerosis (ICD10-I70.0). Electronically Signed   By: Lowella Grip III M.D.   On: 07/02/2019 11:13       HEAR Score (for undifferentiated chest pain):  HEAR Score: 7    Assessment and Plan:   1. NSTEMI: -Pt presented from her PCP office with a 2 week hx of intermittent chest pain which is noted to be worse with exertion with associated SOB. She states that prior to two weeks ago, she was in her usual state of health with no specific health complaints. She went to her PCP office today for routine follow up and had a recurrent episode and was therefore sent to the ED for further evaluation. -She was given ASA on arrival and was found to have a hsT at 26 with repeat at 52.  -CXR with no acute cardiopulmonary disease with mitral annulus calcification as well as aortic atherosclerosis.  -EKG with new complete RBBB and more pronounced TWI in leads V1, V3 and III.  -Will obtain echocardiogram. Last echo from 05/2017 with EF of 55-60% with elevated end-diastolic pressures with severe posterior annular calcification. -She had a stress test in 2013 which was normal -Plan to repeat echocardiogram given new AS murmur heard on exam -Plan for [facility] tomorrow given typical anginal symptoms, elevated hsT, EKG changes and prior  calcifications on chest imaging.  -Add IV Heparin infusion per pharmacy for ACS -Will add transdermal NTG -Will also start her on metoprolol 12.5mg  PO BID -Keep NPO after MDN -Risk stratify with Lipid panel, HbA1c -Continue ASA  2. Carotid artery disease: -Pt has been managed by her PCP with follow up carotid doppler ultrasound imaging -Last [place] from 12/2017 with 50-69% right and left internal carotid artery stenosis. Chest CT from 01/2019 shows advanced atherosclerotic calcifications involving the thoracic aorta and coronary arteries. There also dense calcifications at the mitral -Will obtain Lipid panel  -Will start atorvastatin -May need [facility] referral   3. HTN: -Elevated today, 165/76>164/64 -PTA meds include amlodipine 10 -Will add metoprolol 12.5 PO BID  4. DM2: -Managed by PCP -Will start SSI  -Check HbA1c  5.  Hx of LUL Nodule/mucinous adenocarcinoma:   -S/p VATS and left upper lobectomy -Stable, not a current problem -Follows closely with onocology    6.  Multinodular goiter:  -s/p thyroidectomy in 04/2017 w/ preservation of parathyroids -    Severity of Illness: The appropriate patient status for this patient is OBSERVATION.  Observation status is judged to be reasonable and necessary in order to provide the required intensity of service to ensure the patient's safety. The patient's presenting symptoms, physical exam findings, and initial radiographic and laboratory data in the context of their medical condition is felt to place them at decreased risk for further clinical deterioration. Furthermore, it is anticipated that the patient will be medically stable for discharge from the hospital within 2 midnights of admission. The following factors support the patient status of observation.   " The patient's presenting symptoms include chest pain. " The physical exam findings include chest pain. " The initial radiographic and laboratory data are elevated hsT, abnormal  EKG.     For questions or updates, please contact [place] [place] Please consult [email or web address] for contact info under      Signed, Kathyrn Drown, NP  07/02/2019 1:20 PM   Agree with note by Kathyrn Drown NP  We are asked to see Karen West for evaluation of chest pain.  She is a cardiology patient of Dr. Rosezella [place].  She has known carotid disease and positive risk factors.  She has no prior cardiac history.  She is known to have normal LV systolic function by echo 2 years ago.  She has had new onset chest pain and shortness of breath over the last several weeks, worse this morning relieved with supple nitroglycerin.  She is currently pain-free.  On exam she is got soft carotid bruits and a 2/6 outflow tract murmur.  Lungs are clear.  EKG shows right bundle branch block with inferior T wave inversion.  Her troponins were low to begin with an rose to the mid 50s.  Her story is consistent with acute coronary syndrome.  We will begin her on low-dose beta-blocker, topical nitrate and IV heparin.  We will obtain a 2D echocardiogram, cycle enzymes and arrange for her to undergo cardiac catheterization tomorrow.   I have reviewed the risks, indications, and alternatives to cardiac catheterization, possible angioplasty, and stenting with the patient. Risks include but are not limited to bleeding, infection, vascular injury, stroke, myocardial infection, arrhythmia, kidney injury, radiation-related injury in the case of prolonged fluoroscopy use, emergency cardiac surgery, and death. The patient understands the risks of serious complication is 1-2 in 7169 with diagnostic cardiac cath and 1-2% or less with angioplasty/stenting.     Lorretta Harp, M.D., [place], [facility], [place] [address] [place], [place]  [address]  [phone] 07/02/2019 3:45 PM

## 2019-07-02 NOTE — ED Provider Notes (Signed)
[facility] EMERGENCY DEPARTMENT Provider Note   CSN: [id number] Arrival date & time: 07/02/19  1025     History Chief Complaint  Patient presents with  . Chest Pain    Karen West is a 79 y.o. female.  HPI  79 year old female who has been having substernal chest pain for the past 2 weeks.  It is worse when she goes for a walk and she has had to walk slowly.  She has some associated dyspnea.  She was seen today in her primary care office for scheduled primary visit.  However, when she was there she began having some chest pain and was reportedly diaphoretic.  She was sent here via EMS for evaluation.  She reports that the worst pain has been 8 out of 10 but is currently 0.  HPI: A 79 year old patient with a history of treated diabetes, hypertension and hypercholesterolemia presents for evaluation of chest pain. Initial onset of pain was approximately 1-3 hours ago. The patient's chest pain is described as heaviness/pressure/tightness and is worse with exertion. The patient reports some diaphoresis. The patient's chest pain is middle- or left-sided, is not well-localized, is not sharp and does not radiate to the arms/jaw/neck. The patient does not complain of nausea. The patient has no history of stroke, has no history of peripheral artery disease, has not smoked in the past 90 days, has no relevant family history of coronary artery disease (first degree relative at less than age 24) and does not have an elevated BMI (>=30).   Past Medical History:  Diagnosis Date  . Atherosclerosis of aorta ([place])    a. 01/2017/03/2017 - noted on high res chest CTs.  . Breast cancer ([place])    a. Bilateral --> s/p left mastectomy  . Carotid artery disease ([place])    a. 03/624 w/ 94-85% LICA stenosis and <46% RICA stenosis; b. 10/2015 Carotid U/S: < 50% BICA stenosis  . Chest pain    a. 09/2011 MV: EF 68%, no ischemia/infarct.  . Chronic anemia   . Chronic headaches    denies  .  Coronary artery calcification seen on CT scan    a. 01/2017 High res CT: atherosclerotic calcification of the arterial vascularture, including severe involvement of the coronary arteries; b. 03/2017 CT Chest: coronary and Ao atheroscelrosis.  Marland Kitchen GERD (gastroesophageal reflux disease)   . History of echocardiogram    a. 09/2011 Echo: EF 55-60%, no rwma, triv AI, PASP 74mmHg.  Marland Kitchen Hyperlipidemia   . Hypertension   . Hyperthyroidism   . Left upper lobe pulmonary nodule    a. 02/2017 PET: slowing enlarging 1.7cm LUL nodule w/ low-grade metabolic activity; b. 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 05/2017 s/p VATS.  . Multinodular goiter    a. 02/2017 PET scan- Hypermetabolic nodule;  b. 0/0938 s/p thyroidectomy  . Obesity   . OSA on CPAP    cpap  . Osteoarthritis   . Personal history of radiation therapy 1999  . Right bundle branch block   . Type II or unspecified type diabetes mellitus without mention of complication, uncontrolled     Patient Active Problem List   Diagnosis Date Noted  . Dysphonia 05/21/2018  . Pelvic floor relaxation 10/02/2017  . Blood in urine 10/02/2017  . Left lower quadrant pain 10/02/2017  . Nausea 10/02/2017  . Nocturia 10/02/2017  . Osteopenia 10/02/2017  . Pain in pelvis 10/02/2017  . Adenocarcinoma of left lung, stage 1 ([place]) 07/26/2017  . Iron deficiency anemia 07/26/2017  .  S/P lobectomy of lung   . Long QT interval   . Sinus tachycardia   . Abnormal positron emission tomography (PET) scan 05/06/2017  . Pain of left shoulder joint on movement 04/09/2017  . Rib pain 04/09/2017  . BPPV (benign paroxysmal positional vertigo), left 03/27/2017  . Lipoma 04/06/2016  . Hypersomnia 01/18/2016  . Upper airway resistance syndrome 01/18/2016  . Hyperthyroidism 10/05/2011  . Chest pain at rest 10/04/2011  . Angina at rest [facility]) 10/04/2011  . Headache(784.0) 10/04/2011  . FH: MI (myocardial infarction) 10/04/2011  . Chest pressure 10/04/2011  .  Hyperlipidemia   . Hypertension   . OSA on CPAP   . Breast cancer ([place])   . Chronic anemia   . GERD (gastroesophageal reflux disease)   . Obesity   . Osteoarthritis   . Right bundle branch block   . Atherosclerosis of aorta ([place])   . Multiple thyroid nodules   . Type II or unspecified type diabetes mellitus without mention of complication, uncontrolled   . Chronic headaches   . OBESITY 11/17/2009  . OBSTRUCTIVE SLEEP APNEA 01/05/2009  . DIABETES, TYPE 2 11/12/2008  . HYPERLIPIDEMIA 11/12/2008  . Essential hypertension 11/12/2008  . Lung nodule 11/12/2008  . G E R D 11/12/2008  . UNSPECIFIED DISORDER OF KIDNEY AND URETER 11/12/2008  . OSTEOARTHRITIS 11/12/2008    Past Surgical History:  Procedure Laterality Date  . BREAST BIOPSY  1993; 1995; 2000   left; right; left  . BREAST EXCISIONAL BIOPSY Right pt unsure  . MASTECTOMY Left 2000   left  . THYROIDECTOMY  05/10/2017   VIDEO BRONCHOSCOPY WITH ENDOBRONCHIAL NAVIGATION (N/A)  . THYROIDECTOMY N/A 05/10/2017   Procedure: TOTAL THYROIDECTOMY;  Surgeon: Armandina Gemma, MD;  Location: [facility];  Service: General;  Laterality: N/A;  . VIDEO ASSISTED THORACOSCOPY (VATS)/ LOBECTOMY Left 06/04/2017   Procedure: VIDEO ASSISTED THORACOSCOPY (VATS)/LEFT UPPER LOBECTOMY;  Surgeon: [name], MD;  Location: [facility];  Service: Thoracic;  Laterality: Left;  Marland Kitchen VIDEO BRONCHOSCOPY WITH ENDOBRONCHIAL NAVIGATION N/A 05/10/2017   Procedure: VIDEO BRONCHOSCOPY WITH ENDOBRONCHIAL NAVIGATION;  Surgeon: [name], MD;  Location: [facility];  Service: Thoracic;  Laterality: N/A;  . VIDEO BRONCHOSCOPY WITH ENDOBRONCHIAL ULTRASOUND N/A 06/04/2017   Procedure: VIDEO BRONCHOSCOPY WITH ENDOBRONCHIAL ULTRASOUND;  Surgeon: [name], MD;  Location: [facility];  Service: Thoracic;  Laterality: N/A;     OB History   No obstetric history on file.     Family History  Problem Relation Age of Onset  . Diabetes Brother        x3  .  Hypertension Brother        x3  . Diabetes Brother   . Stroke Brother   . Heart attack Mother 58       Mother Died of MI age 12  . Diabetes Sister   . Stroke Sister     Social History   Tobacco Use  . Smoking status: Never Smoker  . Smokeless tobacco: Never Used  Substance Use Topics  . Alcohol use: Yes    Comment: 10/04/11 "drink a wine or beeer 3-4 times/year"  . Drug use: No    Home Medications Prior to Admission medications   Medication Sig Start Date End Date Taking? Authorizing Provider  acetaminophen (TYLENOL) 500 MG tablet Take 1,000 mg by mouth every 6 (six) hours as needed for moderate pain or headache.    [provider]  amLODipine (NORVASC) 10 MG tablet Take 10 mg by mouth  daily.    [provider]  aspirin 81 MG tablet Take 81 mg by mouth daily.      [provider]  Cobalamin Combinations (B-12) [phone] MCG SUBL vitamin X38 1,829 mcg-folic acid 937 mcg sublingual lozenge  Place by sublingual route.    [provider]  ezetimibe (ZETIA) 10 MG tablet Take 5 mg by mouth daily.     [provider]  ferrous sulfate 325 (65 FE) MG tablet Take 325 mg by mouth 2 (two) times daily. 11/04/18   [provider]  GAVILYTE-G 236 g solution  11/11/18   [provider]  glipiZIDE (GLUCOTROL) 10 MG 24 hr tablet Take 10 mg by mouth 2 (two) times daily.      [provider]  hydrocortisone cream 1 % Apply 1 application topically 2 (two) times daily.    [provider]  levothyroxine (SYNTHROID, LEVOTHROID) 88 MCG tablet Take 88 mcg by mouth daily. 07/02/17   [provider]  Magnesium Oxide 400 (240 Mg) MG TABS Take 1 tablet by mouth 2 (two) times daily.     [provider]  metFORMIN (GLUCOPHAGE) 1000 MG tablet Take 1,000 mg by mouth 2 (two) times daily.     [provider]  NONFORMULARY OR COMPOUNDED ITEM [facility]:  Achilles Tendonitis #12 - Diclofenac 3%, Baclofen  2%, Bupivacaine 1%, Gabapentin 6%, Pentoxifylline 3%, Topiramate 1%. Apply 1-2 grams to affected area 3-4 times daily. 05/28/18   Wallene Huh, DPM  Omega-3 Fatty Acids (FISH OIL) 1200 MG CAPS Take 1,200 mg by mouth 2 (two) times daily.     [provider]  omeprazole (PRILOSEC) 20 MG capsule Take 20 mg by mouth 2 (two) times daily. Takes prn    [provider]  rosuvastatin (CRESTOR) 20 MG tablet Take 20 mg by mouth daily.    [provider]    Allergies    Tramadol, Lisinopril, and Tapazole [methimazole]  Review of Systems   Review of Systems  Physical Exam Updated Vital Signs BP (!) 164/64   Pulse 86   Temp 98.7 F (37.1 C) (Oral)   Resp 14   Ht 1.575 m (5\' 2" )   Wt 68 kg   SpO2 100%   BMI 27.44 kg/m   Physical Exam Vitals and nursing note reviewed.  Constitutional:      Appearance: She is well-developed.  HENT:     Head: Normocephalic and atraumatic.     Right Ear: External ear normal.     Left Ear: External ear normal.     Nose: Nose normal.  Eyes:     Conjunctiva/sclera: Conjunctivae normal.     Pupils: Pupils are equal, round, and reactive to light.  Neck:     Thyroid: No thyromegaly.     Vascular: No JVD.     Trachea: No tracheal deviation.  Cardiovascular:     Rate and Rhythm: Normal rate and regular rhythm.     Heart sounds: Normal heart sounds.  Pulmonary:     Effort: Pulmonary effort is normal.     Breath sounds: Normal breath sounds. No wheezing.  Abdominal:     General: Bowel sounds are normal.     Palpations: Abdomen is soft. There is no mass.     Tenderness: There is no abdominal tenderness. There is no guarding.  Musculoskeletal:        General: Normal range of motion.     Cervical back: Normal range of motion and neck supple.  Lymphadenopathy:     Cervical: No cervical adenopathy.  Skin:    General: Skin is warm and dry.  Neurological:     Mental Status: She is alert and oriented to person, place, and time.      GCS: GCS eye subscore is 4. GCS verbal subscore is 5. GCS motor subscore is 6.     Cranial Nerves: No cranial nerve deficit.     Sensory: No sensory deficit.     Gait: Gait normal.     Deep Tendon Reflexes: Reflexes are normal and symmetric. Babinski sign absent on the right side. Babinski sign absent on the left side.     Reflex Scores:      Bicep reflexes are 2+ on the right side and 2+ on the left side.      Patellar reflexes are 2+ on the right side and 2+ on the left side.    Comments: Strength is normal and equal throughout. Cranial nerves grossly intact. Patient fluent. No gross ataxia and patient able to ambulate without difficulty.  Psychiatric:        Behavior: Behavior normal.        Thought Content: Thought content normal.        Judgment: Judgment normal.     ED Results / Procedures / Treatments   Labs (all labs ordered are listed, but only abnormal results are displayed) Labs Reviewed  BASIC METABOLIC PANEL  CBC  TROPONIN I (HIGH SENSITIVITY)    EKG EKG Interpretation  Date/Time:  Wednesday July 02 2019 10:22:57 EDT Ventricular Rate:  88 PR Interval:  120 QRS Duration: 120 QT Interval:  408 QTC Calculation: 493 R Axis:   -42 Text Interpretation: Normal sinus rhythm Left axis deviation Right bundle branch block Possible Lateral infarct , age undetermined Abnormal ECG Confirmed by Pattricia Boss [phone]) on 07/02/2019 11:17:33 AM   Radiology DG Chest 2 View  Result Date: 07/02/2019 CLINICAL DATA:  Chest pain EXAM: CHEST - 2 VIEW COMPARISON:  October 30, 2017 FINDINGS: Postoperative changes noted on the left with scarring and volume loss on the left, stable. No edema or airspace opacity. Heart size and pulmonary vascularity are stable and within normal limits for postoperative change on the left. There is mitral annulus calcification as well as aortic atherosclerosis. No adenopathy evident. Surgical clips are noted in the right axillary region. No appreciable bone  lesions. Surgical clips are noted in the thyroid region. IMPRESSION: Postoperative changes on left with scarring and volume loss, stable. No edema or airspace opacity. Stable cardiac silhouette. Postoperative changes noted as well in the thyroid and right axillary regions. No adenopathy. Aortic Atherosclerosis (ICD10-I70.0). Electronically Signed   By: Lowella Grip III M.D.   On: 07/02/2019 11:13    Procedures Procedures (including critical care time)  Medications Ordered in ED Medications - No data to display  ED Course  I have reviewed the triage vital signs and the nursing notes.  Pertinent labs & imaging results that were available during my care of the patient were reviewed by me and considered in my medical decision making (see chart for details).    MDM Rules/Calculators/A&P HEAR Score: 7                    Patient had asa prehospital Pain free now Troponin elevated at 26 Cardiology consulted   This patient complains of chest pain, this involves an extensive number of treatment options, and is a complaint that carries with it a high risk  of complications and morbidity.  The differential diagnosis includes acute MI, ACS, PE, intrapulmonary etiolog, gi etiology, dissection or abnormality of the great vessesl   I Ordered, reviewed, and interpreted labs, which included cbc, troponin, bmet  I ordered medication  for chest pain  I ordered imaging studies which included CXR and revealed no acute abnormality  I independently visualized and interpreted imaging which showed no acute changes   Previous records obtained and reviewed echo from 06/09/17  Study Conclusions   - Left ventricle: The cavity size was normal. Wall thickness was  increased in a pattern of severe LVH. Systolic function was  normal. The estimated ejection fraction was in the range of 55%  to 60%. Doppler parameters are consistent with both elevated  ventricular end-diastolic filling pressure and  elevated left  atrial filling pressure.  - Aortic valve: There was mild regurgitation.  - Mitral valve: Severe posterior annular calcification.  - Atrial septum: No defect or patent foramen ovale was identified.  - Pulmonary arteries: PA peak pressure: 47 mm Hg (S).  - Pericardium, extracardiac: A trivial pericardial effusion was  identified.   I consulted cardiology and discussed lab and imaging findings    After the interventions stated above, I reevaluated the patient and found continued pain free and hemodynamically stable Vitals:   07/02/19 1045 07/02/19 1130  BP: (!) 164/64 (!) 165/76  Pulse: 86 77  Resp: 14 16  Temp: 98.7 F (37.1 C)   SpO2: 100% 99%      Final Clinical Impression(s) / ED Diagnoses nstemi  Rx / DC Orders ED Discharge Orders    None       Pattricia Boss, MD 07/03/19 1701

## 2019-07-02 NOTE — Progress Notes (Signed)
ANTICOAGULATION CONSULT NOTE - Initial Consult  Pharmacy Consult for Heparin Indication: chest pain/ACS  Allergies  Allergen Reactions  . Tramadol Nausea And Vomiting and Other (See Comments)    "got sick to my stomach and was throwing up blood; it put me in the hospital")  . Lisinopril Rash and Other (See Comments)    Renal failure   . Tapazole [Methimazole] Itching and Rash    Patient Measurements: Height: 5\' 2"  (157.5 cm) Weight: 68 kg (150 lb) IBW/kg (Calculated) : 50.1 Heparin Dosing Weight: 64.2 kg  Vital Signs: Temp: 98.7 F (37.1 C) (04/14 1045) Temp Source: Oral (04/14 1045) BP: 165/76 (04/14 1130) Pulse Rate: 77 (04/14 1130)  Labs: Recent Labs    07/02/19 1047 07/02/19 1403  HGB 11.1*  --   HCT 35.8*  --   PLT 343  --   CREATININE 1.11*  --   TROPONINIHS 26* 52*    Estimated Creatinine Clearance: 39 mL/min (A) (by C-G formula based on SCr of 1.11 mg/dL (H)).   Medical History: Past Medical History:  Diagnosis Date  . Atherosclerosis of aorta ([place])    a. 01/2017/03/2017 - noted on high res chest CTs.  . Breast cancer ([place])    a. Bilateral --> s/p left mastectomy  . Carotid artery disease ([place])    a. 05/8180 w/ 99-37% LICA stenosis and <16% RICA stenosis; b. 10/2015 Carotid U/S: < 50% BICA stenosis  . Chest pain    a. 09/2011 MV: EF 68%, no ischemia/infarct.  . Chronic anemia   . Chronic headaches    denies  . Coronary artery calcification seen on CT scan    a. 01/2017 High res CT: atherosclerotic calcification of the arterial vascularture, including severe involvement of the coronary arteries; b. 03/2017 CT Chest: coronary and Ao atheroscelrosis.  Marland Kitchen GERD (gastroesophageal reflux disease)   . History of echocardiogram    a. 09/2011 Echo: EF 55-60%, no rwma, triv AI, PASP 65mmHg.  Marland Kitchen Hyperlipidemia   . Hypertension   . Hyperthyroidism   . Left upper lobe pulmonary nodule    a. 02/2017 PET: slowing enlarging 1.7cm LUL nodule w/ low-grade metabolic  activity; b. 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 05/2017 s/p VATS.  . Multinodular goiter    a. 02/2017 PET scan- Hypermetabolic nodule;  b. 05/8099 s/p thyroidectomy  . Obesity   . OSA on CPAP    cpap  . Osteoarthritis   . Personal history of radiation therapy 1999  . Right bundle branch block   . Type II or unspecified type diabetes mellitus without mention of complication, uncontrolled     Assessment: 79 yo F presents with CP. Pharmacy asked to dose heparin. No AC noted PTA. Hgb 11.1, plts wnl, Scr 1.11.  Goal of Therapy:  Heparin level 0.3-0.7 units/ml Monitor platelets by anticoagulation protocol: Yes   Plan:  Give heparin bolus 4000 units IV x1 Start heparin infusion at 800 units/hr Check 8-hr HL Monitor daily HL, CBC, s/sx bleeding  Richardine Service, PharmD PGY1 Pharmacy Resident Phone: [phone] 07/02/2019  3:54 PM  Please check [email or web address] for unit-specific pharmacy phone numbers.

## 2019-07-02 NOTE — ED Notes (Signed)
ASA not given due to pt. Had it with EMS

## 2019-07-02 NOTE — ED Triage Notes (Signed)
Pt bib ems for chest pain x1 week, no other symptoms. Pt given 324 asa and 1 nitro with some relief of pain. Pt a.o, nad noted

## 2019-07-02 NOTE — ED Notes (Signed)
Ray MD made aware of Troponin levels

## 2019-07-02 NOTE — ED Notes (Signed)
Attempted report 

## 2019-07-02 NOTE — ED Notes (Signed)
Pt. Refusing IV at this time stating if she is admitted she will get one due to her being a hard stick,

## 2019-07-02 NOTE — Progress Notes (Signed)
Called ED to get report.  ED RN unable to come to phone to give report.

## 2019-07-03 ENCOUNTER — Encounter (HOSPITAL_COMMUNITY)
Admission: EM | Disposition: A | Discharge status: Home / Self Care | Payer: Self-pay | Source: Home / Self Care | Attending: Cardiovascular Disease

## 2019-07-03 ENCOUNTER — Observation Stay (HOSPITAL_COMMUNITY): Payer: Medicare Other

## 2019-07-03 ENCOUNTER — Other Ambulatory Visit (HOSPITAL_COMMUNITY): Payer: Medicare Other

## 2019-07-03 DIAGNOSIS — E119 Type 2 diabetes mellitus without complications: Secondary | ICD-10-CM | POA: Diagnosis present

## 2019-07-03 DIAGNOSIS — Z7984 Long term (current) use of oral hypoglycemic drugs: Secondary | ICD-10-CM | POA: Diagnosis not present

## 2019-07-03 DIAGNOSIS — E785 Hyperlipidemia, unspecified: Secondary | ICD-10-CM | POA: Diagnosis present

## 2019-07-03 DIAGNOSIS — Z853 Personal history of malignant neoplasm of breast: Secondary | ICD-10-CM | POA: Diagnosis not present

## 2019-07-03 DIAGNOSIS — I350 Nonrheumatic aortic (valve) stenosis: Secondary | ICD-10-CM | POA: Diagnosis not present

## 2019-07-03 DIAGNOSIS — I351 Nonrheumatic aortic (valve) insufficiency: Secondary | ICD-10-CM

## 2019-07-03 DIAGNOSIS — Z79899 Other long term (current) drug therapy: Secondary | ICD-10-CM | POA: Diagnosis not present

## 2019-07-03 DIAGNOSIS — I10 Essential (primary) hypertension: Secondary | ICD-10-CM | POA: Diagnosis present

## 2019-07-03 DIAGNOSIS — K219 Gastro-esophageal reflux disease without esophagitis: Secondary | ICD-10-CM | POA: Diagnosis present

## 2019-07-03 DIAGNOSIS — Z803 Family history of malignant neoplasm of breast: Secondary | ICD-10-CM | POA: Diagnosis not present

## 2019-07-03 DIAGNOSIS — E7800 Pure hypercholesterolemia, unspecified: Secondary | ICD-10-CM | POA: Diagnosis present

## 2019-07-03 DIAGNOSIS — G4733 Obstructive sleep apnea (adult) (pediatric): Secondary | ICD-10-CM | POA: Diagnosis present

## 2019-07-03 DIAGNOSIS — Z79890 Hormone replacement therapy (postmenopausal): Secondary | ICD-10-CM | POA: Diagnosis not present

## 2019-07-03 DIAGNOSIS — Z8249 Family history of ischemic heart disease and other diseases of the circulatory system: Secondary | ICD-10-CM | POA: Diagnosis not present

## 2019-07-03 DIAGNOSIS — R0789 Other chest pain: Secondary | ICD-10-CM | POA: Diagnosis not present

## 2019-07-03 DIAGNOSIS — I214 Non-ST elevation (NSTEMI) myocardial infarction: Secondary | ICD-10-CM | POA: Diagnosis present

## 2019-07-03 DIAGNOSIS — Z833 Family history of diabetes mellitus: Secondary | ICD-10-CM | POA: Diagnosis not present

## 2019-07-03 DIAGNOSIS — Z20822 Contact with and (suspected) exposure to covid-19: Secondary | ICD-10-CM | POA: Diagnosis present

## 2019-07-03 DIAGNOSIS — Z9012 Acquired absence of left breast and nipple: Secondary | ICD-10-CM | POA: Diagnosis not present

## 2019-07-03 DIAGNOSIS — Z7982 Long term (current) use of aspirin: Secondary | ICD-10-CM | POA: Diagnosis not present

## 2019-07-03 DIAGNOSIS — I340 Nonrheumatic mitral (valve) insufficiency: Secondary | ICD-10-CM

## 2019-07-03 DIAGNOSIS — I2510 Atherosclerotic heart disease of native coronary artery without angina pectoris: Secondary | ICD-10-CM

## 2019-07-03 DIAGNOSIS — Z85118 Personal history of other malignant neoplasm of bronchus and lung: Secondary | ICD-10-CM | POA: Diagnosis not present

## 2019-07-03 DIAGNOSIS — Z923 Personal history of irradiation: Secondary | ICD-10-CM | POA: Diagnosis not present

## 2019-07-03 DIAGNOSIS — E890 Postprocedural hypothyroidism: Secondary | ICD-10-CM | POA: Diagnosis present

## 2019-07-03 HISTORY — PX: CORONARY STENT INTERVENTION: CATH118234

## 2019-07-03 HISTORY — PX: LEFT HEART CATH AND CORONARY ANGIOGRAPHY: CATH118249

## 2019-07-03 LAB — CBC
HCT: 31.6 % — ABNORMAL LOW (ref 36.0–46.0)
Hemoglobin: 10.3 g/dL — ABNORMAL LOW (ref 12.0–15.0)
MCH: 28 pg (ref 26.0–34.0)
MCHC: 32.6 g/dL (ref 30.0–36.0)
MCV: 85.9 fL (ref 80.0–100.0)
Platelets: 328 10*3/uL (ref 150–400)
RBC: 3.68 MIL/uL — ABNORMAL LOW (ref 3.87–5.11)
RDW: 14.3 % (ref 11.5–15.5)
WBC: 6.6 10*3/uL (ref 4.0–10.5)
nRBC: 0 % (ref 0.0–0.2)

## 2019-07-03 LAB — LIPID PANEL
Cholesterol: 121 mg/dL (ref 0–200)
HDL: 37 mg/dL — ABNORMAL LOW (ref >40)
LDL Cholesterol: 61 mg/dL (ref 0–99)
Total CHOL/HDL Ratio: 3.3 RATIO
Triglycerides: 113 mg/dL (ref <150)
VLDL: 23 mg/dL (ref 0–40)

## 2019-07-03 LAB — ECHOCARDIOGRAM COMPLETE
Height: 62 in
Weight: 2356.28 oz

## 2019-07-03 LAB — BASIC METABOLIC PANEL
Anion gap: 12 (ref 5–15)
BUN: 24 mg/dL — ABNORMAL HIGH (ref 8–23)
CO2: 24 mmol/L (ref 22–32)
Calcium: 9.3 mg/dL (ref 8.9–10.3)
Chloride: 105 mmol/L (ref 98–111)
Creatinine, Ser: 1.15 mg/dL — ABNORMAL HIGH (ref 0.44–1.00)
GFR calc Af Amer: 54 mL/min — ABNORMAL LOW (ref >60)
GFR calc non Af Amer: 46 mL/min — ABNORMAL LOW (ref >60)
Glucose, Bld: 98 mg/dL (ref 70–99)
Potassium: 3.2 mmol/L — ABNORMAL LOW (ref 3.5–5.1)
Sodium: 141 mmol/L (ref 135–145)

## 2019-07-03 LAB — GLUCOSE, CAPILLARY
Glucose-Capillary: 92 mg/dL (ref 70–99)
Glucose-Capillary: 108 mg/dL — ABNORMAL HIGH (ref 70–99)
Glucose-Capillary: 129 mg/dL — ABNORMAL HIGH (ref 70–99)
Glucose-Capillary: 183 mg/dL — ABNORMAL HIGH (ref 70–99)

## 2019-07-03 LAB — POCT ACTIVATED CLOTTING TIME
Activated Clotting Time: 296 seconds
Activated Clotting Time: 235 seconds
Activated Clotting Time: 296 seconds
Activated Clotting Time: 274 seconds
Activated Clotting Time: 208 seconds
Activated Clotting Time: 180 seconds

## 2019-07-03 LAB — HEPARIN LEVEL (UNFRACTIONATED): Heparin Unfractionated: 0.52 IU/mL (ref 0.30–0.70)

## 2019-07-03 SURGERY — LEFT HEART CATH AND CORONARY ANGIOGRAPHY
Anesthesia: LOCAL

## 2019-07-03 MED ORDER — ONDANSETRON HCL 4 MG/2ML IJ SOLN
INTRAMUSCULAR | Status: DC | PRN
  Administered 2019-07-03: 4 mg via INTRAVENOUS

## 2019-07-03 MED ORDER — HYDRALAZINE HCL 20 MG/ML IJ SOLN: 10.0000 mg | INTRAMUSCULAR | Status: AC | PRN

## 2019-07-03 MED ORDER — VERAPAMIL HCL 2.5 MG/ML IV SOLN: INTRAVENOUS | Status: DC | PRN

## 2019-07-03 MED ORDER — HEPARIN SODIUM (PORCINE) 1000 UNIT/ML IJ SOLN
INTRAMUSCULAR | Status: DC | PRN
  Administered 2019-07-03: 1000 [IU] via INTRAVENOUS
  Administered 2019-07-03: 5000 [IU] via INTRAVENOUS
  Administered 2019-07-03: 1000 [IU] via INTRAVENOUS
  Administered 2019-07-03: 1500 [IU] via INTRAVENOUS
  Administered 2019-07-03: 3500 [IU] via INTRAVENOUS

## 2019-07-03 MED ORDER — FENTANYL CITRATE (PF) 100 MCG/2ML IJ SOLN
INTRAMUSCULAR | Status: DC | PRN
  Administered 2019-07-03: 50 ug via INTRAVENOUS
  Administered 2019-07-03 (×4): 25 ug via INTRAVENOUS

## 2019-07-03 MED ORDER — SODIUM CHLORIDE 0.9% FLUSH
3.0000 mL | Freq: Two times a day (BID) | INTRAVENOUS | Status: DC
  Administered 2019-07-03 – 2019-07-04 (×3): 3 mL via INTRAVENOUS

## 2019-07-03 MED ORDER — ONDANSETRON HCL 4 MG/2ML IJ SOLN
INTRAMUSCULAR | Status: AC
  Filled 2019-07-03: qty 2

## 2019-07-03 MED ORDER — FENTANYL CITRATE (PF) 100 MCG/2ML IJ SOLN
INTRAMUSCULAR | Status: AC
  Filled 2019-07-03: qty 2

## 2019-07-03 MED ORDER — MIDAZOLAM HCL 2 MG/2ML IJ SOLN
INTRAMUSCULAR | Status: DC | PRN
  Administered 2019-07-03 (×2): 1 mg via INTRAVENOUS
  Administered 2019-07-03: 2 mg via INTRAVENOUS

## 2019-07-03 MED ORDER — HEPARIN (PORCINE) IN NACL 1000-0.9 UT/500ML-% IV SOLN
INTRAVENOUS | Status: AC
  Filled 2019-07-03: qty 1000

## 2019-07-03 MED ORDER — LABETALOL HCL 5 MG/ML IV SOLN
10.0000 mg | INTRAVENOUS | Status: AC | PRN
  Administered 2019-07-03 (×2): 10 mg via INTRAVENOUS

## 2019-07-03 MED ORDER — NITROGLYCERIN IN D5W 200-5 MCG/ML-% IV SOLN
INTRAVENOUS | Status: AC | PRN
  Administered 2019-07-03: 10 ug/min via INTRAVENOUS

## 2019-07-03 MED ORDER — TICAGRELOR 90 MG PO TABS
90.0000 mg | ORAL_TABLET | Freq: Two times a day (BID) | ORAL | Status: DC
  Administered 2019-07-03 – 2019-07-05 (×4): 90 mg via ORAL
  Filled 2019-07-03 (×4): qty 1

## 2019-07-03 MED ORDER — SODIUM CHLORIDE 0.9% FLUSH: 3.0000 mL | INTRAVENOUS | Status: DC | PRN

## 2019-07-03 MED ORDER — HEPARIN SODIUM (PORCINE) 1000 UNIT/ML IJ SOLN
INTRAMUSCULAR | Status: AC
  Filled 2019-07-03: qty 1

## 2019-07-03 MED ORDER — TICAGRELOR 90 MG PO TABS
ORAL_TABLET | ORAL | Status: DC | PRN
  Administered 2019-07-03: 180 mg via ORAL

## 2019-07-03 MED ORDER — VERAPAMIL HCL 2.5 MG/ML IV SOLN
INTRAVENOUS | Status: AC
  Filled 2019-07-03: qty 2

## 2019-07-03 MED ORDER — NITROGLYCERIN 1 MG/10 ML FOR IR/CATH LAB
INTRA_ARTERIAL | Status: DC | PRN
  Administered 2019-07-03 (×4): 200 ug via INTRACORONARY
  Administered 2019-07-03: 200 ug via INTRA_ARTERIAL

## 2019-07-03 MED ORDER — IOHEXOL 350 MG/ML SOLN
INTRAVENOUS | Status: DC | PRN
  Administered 2019-07-03: 240 mL

## 2019-07-03 MED ORDER — POTASSIUM CHLORIDE CRYS ER 20 MEQ PO TBCR
40.0000 meq | EXTENDED_RELEASE_TABLET | Freq: Once | ORAL | Status: AC
  Administered 2019-07-03: 40 meq via ORAL
  Filled 2019-07-03: qty 2

## 2019-07-03 MED ORDER — HEPARIN (PORCINE) IN NACL 1000-0.9 UT/500ML-% IV SOLN
INTRAVENOUS | Status: DC | PRN
  Administered 2019-07-03 (×3): 500 mL

## 2019-07-03 MED ORDER — TICAGRELOR 90 MG PO TABS
ORAL_TABLET | ORAL | Status: AC
  Filled 2019-07-03: qty 2

## 2019-07-03 MED ORDER — LIDOCAINE HCL (PF) 1 % IJ SOLN
INTRAMUSCULAR | Status: AC
  Filled 2019-07-03: qty 30

## 2019-07-03 MED ORDER — VERAPAMIL HCL 2.5 MG/ML IV SOLN
INTRAVENOUS | Status: DC | PRN
  Administered 2019-07-03: 10 mL via INTRA_ARTERIAL

## 2019-07-03 MED ORDER — SODIUM CHLORIDE 0.9 % IV SOLN: INTRAVENOUS | Status: DC

## 2019-07-03 MED ORDER — MIDAZOLAM HCL 2 MG/2ML IJ SOLN
INTRAMUSCULAR | Status: AC
  Filled 2019-07-03: qty 2

## 2019-07-03 MED ORDER — LIDOCAINE HCL (PF) 1 % IJ SOLN
INTRAMUSCULAR | Status: DC | PRN
  Administered 2019-07-03: 2 mL via INTRADERMAL
  Administered 2019-07-03: 20 mL via INTRADERMAL

## 2019-07-03 MED ORDER — SODIUM CHLORIDE 0.9 % IV SOLN: 250.0000 mL | INTRAVENOUS | Status: DC | PRN

## 2019-07-03 MED ORDER — ONDANSETRON HCL 4 MG/2ML IJ SOLN: 4.0000 mg | Freq: Four times a day (QID) | INTRAMUSCULAR | Status: DC | PRN

## 2019-07-03 MED ORDER — ACETAMINOPHEN 325 MG PO TABS
650.0000 mg | ORAL_TABLET | ORAL | Status: DC | PRN
  Administered 2019-07-04 (×3): 650 mg via ORAL
  Filled 2019-07-03 (×3): qty 2

## 2019-07-03 MED ORDER — LABETALOL HCL 5 MG/ML IV SOLN
INTRAVENOUS | Status: AC
  Filled 2019-07-03: qty 4

## 2019-07-03 MED ORDER — DIAZEPAM 5 MG PO TABS: 5.0000 mg | ORAL_TABLET | ORAL | Status: DC | PRN

## 2019-07-03 MED ORDER — ASPIRIN 81 MG PO CHEW: 81.0000 mg | CHEWABLE_TABLET | Freq: Every day | ORAL | Status: DC

## 2019-07-03 MED ORDER — NITROGLYCERIN IN D5W 200-5 MCG/ML-% IV SOLN
INTRAVENOUS | Status: AC
  Filled 2019-07-03: qty 250

## 2019-07-03 MED ORDER — HEPARIN SODIUM (PORCINE) 5000 UNIT/ML IJ SOLN
5000.0000 [IU] | Freq: Three times a day (TID) | INTRAMUSCULAR | Status: DC
  Administered 2019-07-04 – 2019-07-05 (×4): 5000 [IU] via SUBCUTANEOUS
  Filled 2019-07-03 (×6): qty 1

## 2019-07-03 SURGICAL SUPPLY — 27 items
BALLN SAPPHIRE 2.0X12 (BALLOONS) ×2
BALLN SAPPHIRE ~~LOC~~ 2.75X12 (BALLOONS) ×1 IMPLANT
BALLN SAPPHIRE ~~LOC~~ 2.75X18 (BALLOONS) ×1 IMPLANT
BALLOON SAPPHIRE 2.0X12 (BALLOONS) IMPLANT
CATH 5FR JL3.5 JR4 ANG PIG MP (CATHETERS) ×1 IMPLANT
CATH LAUNCHER 6FR AL.75 (CATHETERS) ×1 IMPLANT
CATH OPTITORQUE TIG 4.0 5F (CATHETERS) ×1 IMPLANT
CATH TELESCOPE 6F GEC (CATHETERS) ×1 IMPLANT
CATH VISTA GUIDE 6FR AR2 (CATHETERS) ×1 IMPLANT
CATH VISTA GUIDE 6FR JR4 (CATHETERS) ×1 IMPLANT
DEVICE CONTINUOUS FLUSH (MISCELLANEOUS) ×1 IMPLANT
DEVICE RAD COMP TR BAND LRG (VASCULAR PRODUCTS) ×1 IMPLANT
GLIDESHEATH SLEND SS 6F .021 (SHEATH) ×1 IMPLANT
GUIDEWIRE INQWIRE 1.5J.035X260 (WIRE) IMPLANT
INQWIRE 1.5J .035X260CM (WIRE) ×2
KIT ENCORE 26 ADVANTAGE (KITS) ×1 IMPLANT
KIT HEART LEFT (KITS) ×2 IMPLANT
PACK CARDIAC CATHETERIZATION (CUSTOM PROCEDURE TRAY) ×2 IMPLANT
SHEATH PINNACLE 6F 10CM (SHEATH) ×1 IMPLANT
SHEATH PROBE COVER 6X72 (BAG) ×1 IMPLANT
STENT RESOLUTE ONYX 2.5X18 (Permanent Stent) ×1 IMPLANT
STENT RESOLUTE ONYX 2.5X22 (Permanent Stent) ×1 IMPLANT
STENT RESOLUTE ONYX 2.5X38 (Permanent Stent) ×1 IMPLANT
STENT RESOLUTE ONYX 2.75X18 (Permanent Stent) ×1 IMPLANT
TRANSDUCER W/STOPCOCK (MISCELLANEOUS) ×2 IMPLANT
TUBING CIL FLEX 10 FLL-RA (TUBING) ×2 IMPLANT
WIRE COUGAR XT STRL 190CM (WIRE) ×1 IMPLANT

## 2019-07-03 NOTE — Progress Notes (Signed)
  Echocardiogram 2D Echocardiogram has been performed.  Karen West 07/03/2019, 5:04 PM

## 2019-07-03 NOTE — Progress Notes (Signed)
[name] for Heparin Indication: chest pain/ACS  Allergies  Allergen Reactions  . Tramadol Nausea And Vomiting and Other (See Comments)    "got sick to my stomach and was throwing up blood; it put me in the hospital")  . Lisinopril Rash and Other (See Comments)    Renal failure   . Tapazole [Methimazole] Itching and Rash    Patient Measurements: Height: 5\' 2"  (157.5 cm) Weight: 68.4 kg (150 lb 12.7 oz) IBW/kg (Calculated) : 50.1 Heparin Dosing Weight: 64.2 kg  Vital Signs: Temp: 98.2 F (36.8 C) (04/14 1715) Temp Source: Oral (04/14 1715) BP: 107/85 (04/14 1800) Pulse Rate: 75 (04/14 1800)  Labs: Recent Labs    07/02/19 1047 07/02/19 1403 07/03/19 0051  HGB 11.1*  --  10.3*  HCT 35.8*  --  31.6*  PLT 343  --  328  HEPARINUNFRC  --   --  0.52  CREATININE 1.11*  --   --   TROPONINIHS 26* 52*  --     Estimated Creatinine Clearance: 39.1 mL/min (A) (by C-G formula based on SCr of 1.11 mg/dL (H)).   Assessment: 81 YOF presents with CP and Pharmacy asked to dose heparin.  Initial heparin level is therapeutic; no bleeding reported.  Goal of Therapy:  Heparin level 0.3-0.7 units/ml Monitor platelets by anticoagulation protocol: Yes   Plan:  Continue heparin infusion at 800 units/hr Check confirmatory heparin level  Karen West D. Mina Marble, PharmD, BCPS, [place] 07/03/2019, 1:46 AM

## 2019-07-03 NOTE — Interval H&P Note (Signed)
Cath Lab Visit (complete for each Cath Lab visit)  Clinical Evaluation Leading to the Procedure:   ACS: No.  Non-ACS:    Anginal Classification: CCS III  Anti-ischemic medical therapy: Minimal Therapy (1 class of medications)  Non-Invasive Test Results: No non-invasive testing performed  Prior CABG: No previous CABG      History and Physical Interval Note:  07/03/2019 7:37 AM  Karen West  has presented today for surgery, with the diagnosis of nonstemi.  The various methods of treatment have been discussed with the patient and family. After consideration of risks, benefits and other options for treatment, the patient has consented to  Procedure(s): LEFT HEART CATH AND CORONARY ANGIOGRAPHY (N/A) as a surgical intervention.  The patient's history has been reviewed, patient examined, no change in status, stable for surgery.  I have reviewed the patient's chart and labs.  Questions were answered to the patient's satisfaction.     Shelva Majestic

## 2019-07-03 NOTE — Progress Notes (Signed)
Site area: Ri8ht groin a 6 french arterial was removed  Site Prior to Removal:  Level 0  Pressure Applied For 25 MINUTES    Bedrest Begining at 1430  Manual:   Yes.    Patient Status During Pull:  stable  Post Pull Groin Site:  Level 0  Post Pull Instructions Given:  Yes.    Post Pull Pulses Present:  Yes.    Dressing Applied:  Yes.    Comments:

## 2019-07-03 NOTE — Progress Notes (Signed)
@  approx. 0655 pt sent via transporter to Cath Lab. Consent in chart. CBG and void completed immediately prior to transfer. Heparin gtt stopped per Cath Lab as indicated by flow sheet. Pt declined for this RN to update daughter on earlier case time endorsing "it's okay, she'll be here at 9".

## 2019-07-04 ENCOUNTER — Encounter: Payer: Self-pay | Admitting: Cardiology

## 2019-07-04 DIAGNOSIS — I214 Non-ST elevation (NSTEMI) myocardial infarction: Secondary | ICD-10-CM | POA: Diagnosis not present

## 2019-07-04 LAB — BASIC METABOLIC PANEL
Anion gap: 12 (ref 5–15)
BUN: 16 mg/dL (ref 8–23)
CO2: 20 mmol/L — ABNORMAL LOW (ref 22–32)
Calcium: 8.6 mg/dL — ABNORMAL LOW (ref 8.9–10.3)
Chloride: 108 mmol/L (ref 98–111)
Creatinine, Ser: 1.15 mg/dL — ABNORMAL HIGH (ref 0.44–1.00)
GFR calc Af Amer: 54 mL/min — ABNORMAL LOW (ref >60)
GFR calc non Af Amer: 46 mL/min — ABNORMAL LOW (ref >60)
Glucose, Bld: 143 mg/dL — ABNORMAL HIGH (ref 70–99)
Potassium: 3.5 mmol/L (ref 3.5–5.1)
Sodium: 140 mmol/L (ref 135–145)

## 2019-07-04 LAB — GLUCOSE, CAPILLARY
Glucose-Capillary: 146 mg/dL — ABNORMAL HIGH (ref 70–99)
Glucose-Capillary: 161 mg/dL — ABNORMAL HIGH (ref 70–99)
Glucose-Capillary: 177 mg/dL — ABNORMAL HIGH (ref 70–99)
Glucose-Capillary: 113 mg/dL — ABNORMAL HIGH (ref 70–99)

## 2019-07-04 LAB — CBC
HCT: 28 % — ABNORMAL LOW (ref 36.0–46.0)
Hemoglobin: 9.3 g/dL — ABNORMAL LOW (ref 12.0–15.0)
MCH: 27.9 pg (ref 26.0–34.0)
MCHC: 33.2 g/dL (ref 30.0–36.0)
MCV: 84.1 fL (ref 80.0–100.0)
Platelets: 293 10*3/uL (ref 150–400)
RBC: 3.33 MIL/uL — ABNORMAL LOW (ref 3.87–5.11)
RDW: 14.4 % (ref 11.5–15.5)
WBC: 7.8 10*3/uL (ref 4.0–10.5)
nRBC: 0 % (ref 0.0–0.2)

## 2019-07-04 MED ORDER — POTASSIUM CHLORIDE CRYS ER 20 MEQ PO TBCR
20.0000 meq | EXTENDED_RELEASE_TABLET | Freq: Once | ORAL | Status: AC
  Administered 2019-07-04: 20 meq via ORAL
  Filled 2019-07-04: qty 1

## 2019-07-04 MED ORDER — METOPROLOL TARTRATE 12.5 MG HALF TABLET
12.5000 mg | ORAL_TABLET | Freq: Once | ORAL | Status: AC
  Administered 2019-07-04: 12.5 mg via ORAL
  Filled 2019-07-04: qty 1

## 2019-07-04 MED ORDER — METOPROLOL TARTRATE 25 MG PO TABS
25.0000 mg | ORAL_TABLET | Freq: Two times a day (BID) | ORAL | Status: DC
  Administered 2019-07-04 – 2019-07-05 (×2): 25 mg via ORAL
  Filled 2019-07-04 (×2): qty 1

## 2019-07-04 NOTE — TOC Benefit Eligibility Note (Signed)
Transition of Care [facility]) Benefit Eligibility Note    Patient Details  Name: Karen West MRN: [id number] Date of Birth: February 26, 1941   Medication/Dose: BRILINTA   90 MG BID  Covered?: Yes  Tier: (TIER- 4 DRUG)  Prescription Coverage Preferred Pharmacy: [facility]  Spoke with Person/Company/Phone Number:: GABBY  @ [facility] [employer]  #  [phone]  Co-Pay: $426.67  Prior Approval: No  Deductible: Unmet(OUT-OF-POCKET : NOT MET)       Memory Argue Phone Number: 07/04/2019, 11:43 AM

## 2019-07-04 NOTE — Care Management (Signed)
07-04-19 1555 Case Manager provided patient with 30 day free card for Brilinta. No further needs at this time. Bethena Roys, RN,BSN Case Manager

## 2019-07-04 NOTE — Progress Notes (Signed)
Sent out for Benefits eligibility check for Brilinta

## 2019-07-04 NOTE — TOC Transition Note (Signed)
Transition of Care [facility]) - CM/SW Discharge Note   Patient Details  Name: Karen West MRN: [id number] Date of Birth: 20-Aug-1940  [facility] of [facility]) CM/SW Contact:  Verdell Carmine, RN Phone Number: 07/04/2019, 12:43 PM   Clinical Narrative:    Patient presents to hospital for NSTEMI cardiac catheterization  With complication of dissection and stent x4 placement.  She will be on Brilinta . Eligibility benefits realizes a 426 dollar co-pay for [employer].  Spoke to patient , cannot pay this co-pay, lives on fixed income.  She sttats MD will Give one month and then change medication. .  She has a walker at home, and has no needs. Has sister that lives nearby.          Patient Goals and CMS Choice        Discharge Placement             Home           Discharge Plan and Services               Home , no services required                      Social Determinants of Health (SDOH) Interventions     Readmission Risk Interventions No flowsheet data found.

## 2019-07-04 NOTE — Progress Notes (Addendum)
Progress Note  Patient Name: CHANAH TIDMORE Date of Encounter: 07/04/2019  Primary Cardiologist: Minus Breeding, MD   Subjective   Feeling well this morning. Alittle soreness in her chest.   Inpatient Medications    Scheduled Meds:  amLODipine  10 mg Oral Daily   aspirin EC  81 mg Oral Daily   ezetimibe  10 mg Oral QPM   heparin  5,000 Units Subcutaneous Q8H   insulin aspart  0-15 Units Subcutaneous TID WC   levothyroxine  88 mcg Oral Q0600   metoprolol tartrate  12.5 mg Oral Once   metoprolol tartrate  25 mg Oral BID   nitroGLYCERIN  0.5 inch Topical Q6H   rosuvastatin  20 mg Oral QPM   sodium chloride flush  3 mL Intravenous Q12H   sodium chloride flush  3 mL Intravenous Q12H   ticagrelor  90 mg Oral BID   Continuous Infusions:  sodium chloride Stopped (07/04/19 0349)   sodium chloride     PRN Meds: sodium chloride, acetaminophen, diazepam, ondansetron (ZOFRAN) IV, sodium chloride flush   Vital Signs    Vitals:   07/03/19 2000 07/03/19 2352 07/04/19 0254 07/04/19 0850  BP:  (!) 158/63 (!) 155/87 (!) 191/116  Pulse: 76 76 86 98  Resp: 18 16 17    Temp:  99 F (37.2 C) 98.7 F (37.1 C)   TempSrc:  Oral Oral   SpO2: 98%  100%   Weight:   68.8 kg   Height:        Intake/Output Summary (Last 24 hours) at 07/04/2019 0858 Last data filed at 07/04/2019 0349 Gross per 24 hour  Intake 2936.72 ml  Output 2750 ml  Net 186.72 ml   Last 3 Weights 07/04/2019 07/03/2019 07/02/2019  Weight (lbs) 151 lb 9.6 oz 147 lb 4.3 oz 150 lb 12.7 oz  Weight (kg) 68.765 kg 66.8 kg 68.4 kg      Telemetry    SR - Personally Reviewed  ECG    SR with incomplete RBBB - Personally Reviewed  Physical Exam  Pleasant older AAF GEN: No acute distress.   Neck: No JVD Cardiac: RRR, no murmurs, rubs, or gallops.  Respiratory: Clear to auscultation bilaterally. GI: Soft, nontender, non-distended  MS: No edema; No deformity. Right radial/femoral cath site stable Neuro:  Nonfocal    Psych: Normal affect   Labs    High Sensitivity Troponin:   Recent Labs  Lab 07/02/19 1047 07/02/19 1403  TROPONINIHS 26* 52*      Chemistry Recent Labs  Lab 07/02/19 1047 07/02/19 1121 07/03/19 0051 07/04/19 0351  NA 140  --  141 140  K 3.5  --  3.2* 3.5  CL 104  --  105 108  CO2 22  --  24 20*  GLUCOSE 108*  --  98 143*  BUN 25*  --  24* 16  CREATININE 1.11*  --  1.15* 1.15*  CALCIUM 9.7  --  9.3 8.6*  PROT  --  7.6  --   --   ALBUMIN  --  3.5  --   --   AST  --  27  --   --   ALT  --  32  --   --   ALKPHOS  --  71  --   --   BILITOT  --  0.9  --   --   GFRNONAA 48*  --  46* 46*  GFRAA 56*  --  54* 54*  ANIONGAP 14  --  12 12     Hematology Recent Labs  Lab 07/02/19 1047 07/03/19 0051 07/04/19 0351  WBC 6.8 6.6 7.8  RBC 4.12 3.68* 3.33*  HGB 11.1* 10.3* 9.3*  HCT 35.8* 31.6* 28.0*  MCV 86.9 85.9 84.1  MCH 26.9 28.0 27.9  MCHC 31.0 32.6 33.2  RDW 14.1 14.3 14.4  PLT 343 328 293    BNPNo results for input(s): BNP, PROBNP in the last 168 hours.   DDimer No results for input(s): DDIMER in the last 168 hours.   Radiology    DG Chest 2 View  Result Date: 07/02/2019 CLINICAL DATA:  Chest pain EXAM: CHEST - 2 VIEW COMPARISON:  October 30, 2017 FINDINGS: Postoperative changes noted on the left with scarring and volume loss on the left, stable. No edema or airspace opacity. Heart size and pulmonary vascularity are stable and within normal limits for postoperative change on the left. There is mitral annulus calcification as well as aortic atherosclerosis. No adenopathy evident. Surgical clips are noted in the right axillary region. No appreciable bone lesions. Surgical clips are noted in the thyroid region. IMPRESSION: Postoperative changes on left with scarring and volume loss, stable. No edema or airspace opacity. Stable cardiac silhouette. Postoperative changes noted as well in the thyroid and right axillary regions. No adenopathy. Aortic Atherosclerosis  (ICD10-I70.0). Electronically Signed   By: Lowella Grip III M.D.   On: 07/02/2019 11:13   CARDIAC CATHETERIZATION  Result Date: 07/03/2019  Prox LAD to Mid LAD lesion is 70% stenosed.  Prox LAD lesion is 40% stenosed.  Mid RCA to Dist RCA lesion is 80% stenosed.  A stent was successfully placed.  Dist RCA lesion is 95% stenosed.  Post intervention, there is a 0% residual stenosis.  Post intervention, there is a 0% residual stenosis.  There is severe mitral annular calcification.  The LAD has moderate diffuse calcification with 40 followed by 70% proximal stenosis prior to the takeoff of the first diagonal vessel with 20% mid stenosis. Left circumflex vessel is tortuous and free of significant obstructive disease. The RCA is a dominant vessel that had 80% diffuse stenosis in the region of the acute margin followed by 95% focal distal stenosis. Transient 2-1/complete heart block with insertion of catheter into the LV with spontaneous resolution. PCI performed via the femoral approach after transitioning from the radial approach which was associated with significant radial artery spasm. Very difficult PCI with very difficult catheter backup with initial PTCA of the distal and acute margin stenoses with subsequent development of guiding catheter induced very proximal RCA dissection extending to the acute margin.  Utilizing telescope guidliner support from distal to near ostial for Resolute Onyx DES stents were inserted with the most distal stent being a 2.5 x 18 mm stent followed by 2.5 x 22 mm stent, 2.5 x 38 mm stent and the most proximal stent a 2.75 x 18 mm Resolute Onyx stent with an excellent angiographic result with brisk TIMI-3 flow and no evidence for residual dissection. RECOMMENDATION: Patient will be hydrated post procedure.  Continue DAPT for minimum of 12 months.  Initiate medical therapy for ischemic disease.  May need future CSI atherectomy to the LAD lesion which is significantly  calcified.  Aggressive lipid-lowering therapy.   ECHOCARDIOGRAM COMPLETE  Result Date: 07/03/2019    ECHOCARDIOGRAM REPORT   Patient Name:   SHATIKA GRINNELL Date of Exam: 07/03/2019 Medical Rec #:  [id number]        Height:  62.0 in Accession #:    [id number]       Weight:       147.3 lb Date of Birth:  06-27-1940         BSA:          1.679 m Patient Age:    89 years         BP:           158/74 mmHg Patient Gender: F                HR:           77 bpm. Exam Location:  Inpatient Procedure: 2D Echo STAT ECHO Indications:    chest pain 786.50  History:        Patient has prior history of Echocardiogram examinations, most                 recent 06/09/2017.  Sonographer:    Johny Chess Referring Phys: [place]  1. Left ventricular ejection fraction, by estimation, is 65 to 70%. The left ventricle has normal function. The left ventricle has no regional wall motion abnormalities. Left ventricular diastolic parameters are consistent with Grade I diastolic dysfunction (impaired relaxation). Elevated left ventricular end-diastolic pressure.  2. Right ventricular systolic function is normal. The right ventricular size is normal.  3. The mitral valve is normal in structure. Moderate mitral valve regurgitation. No evidence of mitral stenosis.  4. Tricuspid valve regurgitation is mild to moderate.  5. The aortic valve is tricuspid. Aortic valve regurgitation is moderate. Mild aortic valve stenosis. Aortic valve mean gradient measures 7.0 mmHg.  6. The inferior vena cava is normal in size with greater than 50% respiratory variability, suggesting right atrial pressure of 3 mmHg. FINDINGS  Left Ventricle: Left ventricular ejection fraction, by estimation, is 65 to 70%. The left ventricle has normal function. The left ventricle has no regional wall motion abnormalities. The left ventricular internal cavity size was normal in size. There is  no left ventricular hypertrophy. Left ventricular  diastolic parameters are consistent with Grade I diastolic dysfunction (impaired relaxation). Elevated left ventricular end-diastolic pressure. Right Ventricle: The right ventricular size is normal. No increase in right ventricular wall thickness. Right ventricular systolic function is normal. Left Atrium: Left atrial size was normal in size. Right Atrium: Right atrial size was normal in size. Pericardium: There is no evidence of pericardial effusion. Mitral Valve: The mitral valve is normal in structure. There is moderate thickening of the mitral valve leaflet(s). Normal mobility of the mitral valve leaflets. Moderate mitral annular calcification. Moderate mitral valve regurgitation. No evidence of mitral valve stenosis. MV peak gradient, 11.6 mmHg. The mean mitral valve gradient is 4.0 mmHg. Tricuspid Valve: The tricuspid valve is normal in structure. Tricuspid valve regurgitation is mild to moderate. No evidence of tricuspid stenosis. Aortic Valve: The aortic valve is tricuspid. . There is severe thickening and severe calcifcation of the aortic valve. Aortic valve regurgitation is moderate. Aortic regurgitation PHT measures 416 msec. Mild aortic stenosis is present. There is severe thickening of the aortic valve. There is severe calcifcation of the aortic valve. Aortic valve mean gradient measures 7.0 mmHg. Pulmonic Valve: The pulmonic valve was normal in structure. Pulmonic valve regurgitation is not visualized. No evidence of pulmonic stenosis. Aorta: The aortic root is normal in size and structure. Venous: The inferior vena cava is normal in size with greater than 50% respiratory variability, suggesting right atrial pressure of 3 mmHg. IAS/Shunts:  No atrial level shunt detected by color flow Doppler.  LEFT VENTRICLE PLAX 2D LVIDd:         3.60 cm  Diastology LVIDs:         2.70 cm  LV e' lateral:   5.00 cm/s LV PW:         1.10 cm  LV E/e' lateral: 11.2 LV IVS:        0.90 cm  LV e' medial:    2.94 cm/s LVOT  diam:     1.80 cm  LV E/e' medial:  19.0 LV SV:         58 LV SV Index:   34 LVOT Area:     2.54 cm  RIGHT VENTRICLE RV S prime:     9.14 cm/s TAPSE (M-mode): 1.5 cm LEFT ATRIUM             Index LA diam:        3.20 cm 1.91 cm/m LA Vol (A2C):   45.6 ml 27.17 ml/m LA Vol (A4C):   48.0 ml 28.60 ml/m LA Biplane Vol: 46.9 ml 27.94 ml/m  AORTIC VALVE AV Mean Grad: 7.0 mmHg LVOT Vmax:    121.00 cm/s LVOT Vmean:   77.700 cm/s LVOT VTI:     0.227 m AI PHT:       416 msec  AORTA Ao Root diam: 2.80 cm Ao Asc diam:  3.30 cm MITRAL VALVE MV Peak grad: 11.6 mmHg     SHUNTS MV Mean grad: 4.0 mmHg      Systemic VTI:  0.23 m MV Vmax:      1.70 m/s      Systemic Diam: 1.80 cm MV Vmean:     85.7 cm/s MV E velocity: 56.00 cm/s MV A velocity: 140.00 cm/s MV E/A ratio:  0.40 Ena Dawley MD Electronically signed by Ena Dawley MD Signature Date/Time: 07/03/2019/5:33:02 PM    Final     Cardiac Studies   Cath: 07/03/19  Prox LAD to Mid LAD lesion is 70% stenosed. Prox LAD lesion is 40% stenosed. Mid RCA to Dist RCA lesion is 80% stenosed. A stent was successfully placed. Dist RCA lesion is 95% stenosed. Post intervention, there is a 0% residual stenosis. Post intervention, there is a 0% residual stenosis.   There is severe mitral annular calcification.     The LAD has moderate diffuse calcification with 40 followed by 70% proximal stenosis prior to the takeoff of the first diagonal vessel with 20% mid stenosis.   Left circumflex vessel is tortuous and free of significant obstructive disease.   The RCA is a dominant vessel that had 80% diffuse stenosis in the region of the acute margin followed by 95% focal distal stenosis.   Transient 2-1/complete heart block with insertion of catheter into the LV with spontaneous resolution.   PCI performed via the femoral approach after transitioning from the radial approach which was associated with significant radial artery spasm.   Very difficult PCI with very  difficult catheter backup with initial PTCA of the distal and acute margin stenoses with subsequent development of guiding catheter induced very proximal RCA dissection extending to the acute margin.  Utilizing telescope guidliner support from distal to near ostial for Resolute Onyx DES stents were inserted with the most distal stent being a 2.5 x 18 mm stent followed by 2.5 x 22 mm stent, 2.5 x 38 mm stent and the most proximal stent a 2.75 x 18 mm Resolute Onyx stent with an excellent  angiographic result with brisk TIMI-3 flow and no evidence for residual dissection.   RECOMMENDATION: Patient will be hydrated post procedure.  Continue DAPT for minimum of 12 months.  Initiate medical therapy for ischemic disease.  May need future CSI atherectomy to the LAD lesion which is significantly calcified.  Aggressive lipid-lowering therapy.  Diagnostic Dominance: Right  Intervention   Echo: 07/03/19  IMPRESSIONS     1. Left ventricular ejection fraction, by estimation, is 65 to 70%. The  left ventricle has normal function. The left ventricle has no regional  wall motion abnormalities. Left ventricular diastolic parameters are  consistent with Grade I diastolic  dysfunction (impaired relaxation). Elevated left ventricular end-diastolic  pressure.   2. Right ventricular systolic function is normal. The right ventricular  size is normal.   3. The mitral valve is normal in structure. Moderate mitral valve  regurgitation. No evidence of mitral stenosis.   4. Tricuspid valve regurgitation is mild to moderate.   5. The aortic valve is tricuspid. Aortic valve regurgitation is moderate.  Mild aortic valve stenosis. Aortic valve mean gradient measures 7.0 mmHg.   6. The inferior vena cava is normal in size with greater than 50%  respiratory variability, suggesting right atrial pressure of 3 mmHg.   Patient Profile     79 y.o. 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   Assessment & Plan    1. NSTEMI: hsTn peaked at 52. Underwent cardiac cath yesterday noted above with PCI/DES x4 to the RCA. Placed on DAPT with ASA/Brilinta for a minimum of 12 month, likely long term given amount of stenting. No chest pain this morning, just soreness. Echo showed normal EF with G1DD, no rWMA noted. Does have residual disease in the LAD which will likely require arthrectomy. Will plan for medical therapy regarding the LAD, and follow up in the office regarding symptoms. -- plan to walk with cardiac rehab  2. HTN: blood pressures are elevated this morning. Will further titrate metoprolol to 25mg  BID -- norvasc 10mg   3. HL: LDL 61, on crestor and Zetia  4. DM: Hgb A1c 7.3 -- on SSI -- Jardiance PTA  5. Hx of LULnodule/mucinous adenocarcinoma:   -S/p VATS and left upper lobectomy -Stable, not a current problem -Follows closely with onocology   For questions or updates, please contact [place] Please consult [email or web address] for contact info under   Signed, Reino Bellis, NP  07/04/2019, 8:58 AM   Agree with note by Reino Bellis NP-C  Ms. Royle had a complex RCA intervention with 4 overlapping drug-eluting stents secondary to guide catheter induced spiral dissection.  She does have a 60 to 70% calcified proximal LAD stenosis.  She has normal LV function.  We will keep her 1 additional day and ambulator with intention to discharge home in the morning.  Her exam is benign.  Lorretta Harp, M.D., [place], [facility], [place] [address] [place], [place]  [id number]  [phone] 07/04/2019 11:05 AM

## 2019-07-04 NOTE — Progress Notes (Signed)
CARDIAC REHAB PHASE I   PRE:  Rate/Rhythm: 95 SR    BP: sitting 175/66     SaO2: 96 RA  MODE:  Ambulation: 160 ft   POST:  Rate/Rhythm: 106 ST    BP: sitting      SaO2: 97 RA  Pt needed to pull up on RW due to significant right knee pain. This has been an ongoing problem for her. Once up, she was able to walk fairly steady. Tired with distance. Did not take BP again because it is so hard for her to get her knee in bed to take BP in leg. Encouraged her to have knee evaluated by PCP soon. Discussed MI, stent, Brilinta, diet, ex as tolerated, NTG and CRPII. Voiced understanding. Will refer to [place]. Pt did pulmonary rehab previously. Does not have tablet or smartphone. [phone]  [place], ACSM 07/04/2019 10:01 AM

## 2019-07-05 ENCOUNTER — Other Ambulatory Visit: Payer: Self-pay | Admitting: Medical

## 2019-07-05 DIAGNOSIS — I214 Non-ST elevation (NSTEMI) myocardial infarction: Secondary | ICD-10-CM | POA: Diagnosis not present

## 2019-07-05 DIAGNOSIS — R7989 Other specified abnormal findings of blood chemistry: Secondary | ICD-10-CM

## 2019-07-05 LAB — BASIC METABOLIC PANEL
Anion gap: 12 (ref 5–15)
BUN: 16 mg/dL (ref 8–23)
CO2: 22 mmol/L (ref 22–32)
Calcium: 9 mg/dL (ref 8.9–10.3)
Chloride: 108 mmol/L (ref 98–111)
Creatinine, Ser: 1.36 mg/dL — ABNORMAL HIGH (ref 0.44–1.00)
GFR calc Af Amer: 44 mL/min — ABNORMAL LOW (ref >60)
GFR calc non Af Amer: 38 mL/min — ABNORMAL LOW (ref >60)
Glucose, Bld: 138 mg/dL — ABNORMAL HIGH (ref 70–99)
Potassium: 3.6 mmol/L (ref 3.5–5.1)
Sodium: 142 mmol/L (ref 135–145)

## 2019-07-05 LAB — GLUCOSE, CAPILLARY
Glucose-Capillary: 124 mg/dL — ABNORMAL HIGH (ref 70–99)
Glucose-Capillary: 243 mg/dL — ABNORMAL HIGH (ref 70–99)

## 2019-07-05 MED ORDER — METOPROLOL TARTRATE 25 MG PO TABS: 25.0000 mg | ORAL_TABLET | Freq: Two times a day (BID) | ORAL | 6 refills | Status: DC

## 2019-07-05 MED ORDER — TICAGRELOR 90 MG PO TABS: 90.0000 mg | ORAL_TABLET | Freq: Two times a day (BID) | ORAL | 3 refills | Status: DC

## 2019-07-05 NOTE — Discharge Instructions (Signed)
Ticagrelor oral tablet What is this medicine? TICAGRELOR (TYE ka GREL or) helps to prevent blood clots. This medicine is used to prevent heart attack, stroke, or other vascular events in people who have had a recent heart attack or who have severe chest pain. It is also used to lower the chance of stroke or heart attack in people with a medical condition called coronary artery disease. This medicine may be used for other purposes; ask your health care provider or pharmacist if you have questions. COMMON BRAND NAME(S): BRILINTA What should I tell my health care provider before I take this medicine? They need to know if you have any of these conditions:  bleeding disorders  bleeding in the brain  having surgery  history of irregular heartbeat  history of stomach bleeding  liver disease  an unusual or allergic reaction to ticagrelor, other medicines, foods, dyes, or preservatives  pregnant or trying to get pregnant  breast-feeding How should I use this medicine? Take this medicine by mouth with a glass of water. Follow the directions on the prescription label. You can take it with or without food. If it upsets your stomach, take it with food. Take your medicine at regular intervals. Tamasha Laplante not take it more often than directed. Allizon Woznick not stop taking except on your doctor's advice. A special MedGuide will be given to you by the pharmacist with each prescription and refill. Be sure to read this information carefully each time. Talk to your pediatrician regarding the use of this medicine in children. Special care may be needed. Overdosage: If you think you have taken too much of this medicine contact a poison control center or emergency room at once. NOTE: This medicine is only for you. Christiaan Strebeck not share this medicine with others. What if I miss a dose? If you miss a dose, take it as soon as you can. If it is almost time for your next dose, take only that dose. Rekita Miotke not take double or extra doses. What  may interact with this medicine? Mars Scheaffer not take this medicine with any of the following medications:  defibrotide  itraconazole This medicine may also interact with the following medications:  aspirin  certain antibiotics like clarithromycin, telithromycin, and rifampin  certain antiviral medicines for HIV or AIDS like atazanavir, indinavir, nelfinavir, ritonavir, and saquinavir  certain medicines for cholesterol like lovastatin and simvastatin  certain medicines for fungal infections like ketoconazole and voriconazole  certain medicines for seizures like carbamazepine, phenobarbital, and phenytoin  digoxin  narcotic medicines for pain  nefazodone This list may not describe all possible interactions. Give your health care provider a list of all the medicines, herbs, non-prescription drugs, or dietary supplements you use. Also tell them if you smoke, drink alcohol, or use illegal drugs. Some items may interact with your medicine. What should I watch for while using this medicine? Visit your healthcare professional for regular checks on your progress. Your condition will be monitored carefully while you are receiving this medicine. It is important not to miss any appointments. Notify your doctor or health care professional and seek emergency treatment if you develop breathing problems; changes in vision; chest pain; severe, sudden headache; pain, swelling, warmth in the leg; trouble speaking; sudden numbness or weakness of the face, arm, or leg. These can be signs that your condition has gotten worse. If you are going to have surgery or dental work, tell your doctor or health care professional that you are taking this medicine. You should take aspirin every  day with this medicine. Nedra Mcinnis not take more than 100 mg each day. Talk to your healthcare professional if you have questions. What side effects may I notice from receiving this medicine? Side effects that you should report to your doctor  or health care professional as soon as possible:  allergic reactions like skin rash, itching or hives, swelling of the face, lips, or tongue  breathing problems  fast or irregular heartbeat  feeling faint or light-headed, falls  signs and symptoms of bleeding such as bloody or black, tarry stools; red or dark-brown urine; spitting up blood or brown material that looks like coffee grounds; red spots on the skin; unusual bruising or bleeding from the eye, gums, or nose Side effects that usually Courteney Alderete not require medical attention (report to your doctor or health care professional if they continue or are bothersome):  breast enlargement in both males and females  diarrhea  dizziness  headache  tiredness  upset stomach This list may not describe all possible side effects. Call your doctor for medical advice about side effects. You may report side effects to FDA at 1-800-FDA-1088. Where should I keep my medicine? Keep out of the reach of children. Store at room temperature of 59 to 86 degrees F (15 to 30 degrees C). Throw away any unused medicine after the expiration date. NOTE: This sheet is a summary. It may not cover all possible information. If you have questions about this medicine, talk to your doctor, pharmacist, or health care provider.  2020 Elsevier/Gold Standard (2018-08-19 21:21:41) Femoral Site Care This sheet gives you information about how to care for yourself after your procedure. Your health care provider may also give you more specific instructions. If you have problems or questions, contact your health care provider. What can I expect after the procedure? After the procedure, it is common to have:  Bruising that usually fades within 1-2 weeks.  Tenderness at the site. Follow these instructions at home: Wound care  Follow instructions from your health care provider about how to take care of your insertion site. Make sure you: ? Wash your hands with soap and water  before you change your bandage (dressing). If soap and water are not available, use hand sanitizer. ? Change your dressing as told by your health care provider. ? Leave stitches (sutures), skin glue, or adhesive strips in place. These skin closures may need to stay in place for 2 weeks or longer. If adhesive strip edges start to loosen and curl up, you may trim the loose edges. Earlie Arciga not remove adhesive strips completely unless your health care provider tells you to Joey Hudock that.  Melesa Lecy not take baths, swim, or use a hot tub until your health care provider approves.  You may shower 24-48 hours after the procedure or as told by your health care provider. ? Gently wash the site with plain soap and water. ? Pat the area dry with a clean towel. ? Tyson Masin not rub the site. This may cause bleeding.  Fallon Haecker not apply powder or lotion to the site. Keep the site clean and dry.  Check your femoral site every day for signs of infection. Check for: ? Redness, swelling, or pain. ? Fluid or blood. ? Warmth. ? Pus or a bad smell. Activity  For the first 2-3 days after your procedure, or as long as directed: ? Avoid climbing stairs as much as possible. ? Irish Piech not squat.  Vilma Will not lift anything that is heavier than 10 lb (4.5 kg),  or the limit that you are told, until your health care provider says that it is safe.  Rest as directed. ? Avoid sitting for a long time without moving. Get up to take short walks every 1-2 hours.  Ledon Weihe not drive for 24 hours if you were given a medicine to help you relax (sedative). General instructions  Take over-the-counter and prescription medicines only as told by your health care provider.  Keep all follow-up visits as told by your health care provider. This is important. Contact a health care provider if you have:  A fever or chills.  You have redness, swelling, or pain around your insertion site. Get help right away if:  The catheter insertion area swells very fast.  You pass  out.  You suddenly start to sweat or your skin gets clammy.  The catheter insertion area is bleeding, and the bleeding does not stop when you hold steady pressure on the area.  The area near or just beyond the catheter insertion site becomes pale, cool, tingly, or numb. These symptoms may represent a serious problem that is an emergency. Alaric Gladwin not wait to see if the symptoms will go away. Get medical help right away. Call your local emergency services (911 in the [place] Faline Langer not drive yourself to the hospital. Summary  After the procedure, it is common to have bruising that usually fades within 1-2 weeks.  Check your femoral site every day for signs of infection.  Lola Lofaro not lift anything that is heavier than 10 lb (4.5 kg), or the limit that you are told, until your health care provider says that it is safe. This information is not intended to replace advice given to you by your health care provider. Make sure you discuss any questions you have with your health care provider. Document Revised: 03/19/2017 Document Reviewed: 03/19/2017 Elsevier Patient Education  2020 [employer]  This sheet gives you information about how to care for yourself after your procedure. Your health care provider may also give you more specific instructions. If you have problems or questions, contact your health care provider. What can I expect after the procedure? After the procedure, it is common to have:  Bruising and tenderness at the catheter insertion area. Follow these instructions at home: Medicines  Take over-the-counter and prescription medicines only as told by your health care provider. Insertion site care  Follow instructions from your health care provider about how to take care of your insertion site. Make sure you: ? Wash your hands with soap and water before you change your bandage (dressing). If soap and water are not available, use hand sanitizer. ? Change your dressing as told  by your health care provider. ? Leave stitches (sutures), skin glue, or adhesive strips in place. These skin closures may need to stay in place for 2 weeks or longer. If adhesive strip edges start to loosen and curl up, you may trim the loose edges. Adolphus Hanf not remove adhesive strips completely unless your health care provider tells you to Shayna Eblen that.  Check your insertion site every day for signs of infection. Check for: ? Redness, swelling, or pain. ? Fluid or blood. ? Pus or a bad smell. ? Warmth.  Zigmund Linse not take baths, swim, or use a hot tub until your health care provider approves.  You may shower 24-48 hours after the procedure, or as directed by your health care provider. ? Remove the dressing and gently wash the site with plain soap and water. ?  Pat the area dry with a clean towel. ? Lorie Cleckley not rub the site. That could cause bleeding.  Tiyana Galla not apply powder or lotion to the site. Activity   For 24 hours after the procedure, or as directed by your health care provider: ? Yeraldin Litzenberger not flex or bend the affected arm. ? Waldo Damian not push or pull heavy objects with the affected arm. ? Marinna Blane not drive yourself home from the hospital or clinic. You may drive 24 hours after the procedure unless your health care provider tells you not to. ? Ruben Mahler not operate machinery or power tools.  Phyllis Whitefield not lift anything that is heavier than 10 lb (4.5 kg), or the limit that you are told, until your health care provider says that it is safe.  Ask your health care provider when it is okay to: ? Return to work or school. ? Resume usual physical activities or sports. ? Resume sexual activity. General instructions  If the catheter site starts to bleed, raise your arm and put firm pressure on the site. If the bleeding does not stop, get help right away. This is a medical emergency.  If you went home on the same day as your procedure, a responsible adult should be with you for the first 24 hours after you arrive home.  Keep all  follow-up visits as told by your health care provider. This is important. Contact a health care provider if:  You have a fever.  You have redness, swelling, or yellow drainage around your insertion site. Get help right away if:  You have unusual pain at the radial site.  The catheter insertion area swells very fast.  The insertion area is bleeding, and the bleeding does not stop when you hold steady pressure on the area.  Your arm or hand becomes pale, cool, tingly, or numb. These symptoms may represent a serious problem that is an emergency. Tariah Transue not wait to see if the symptoms will go away. Get medical help right away. Call your local emergency services (911 in the [place] Dusten Ellinwood not drive yourself to the hospital. Summary  After the procedure, it is common to have bruising and tenderness at the site.  Follow instructions from your health care provider about how to take care of your radial site wound. Check the wound every day for signs of infection.  Antoino Westhoff not lift anything that is heavier than 10 lb (4.5 kg), or the limit that you are told, until your health care provider says that it is safe. This information is not intended to replace advice given to you by your health care provider. Make sure you discuss any questions you have with your health care provider. Document Revised: 04/11/2017 Document Reviewed: 04/11/2017 Elsevier Patient Education  2020 [employer].

## 2019-07-05 NOTE — Progress Notes (Signed)
Pt's stable, DC home via wheelchair 

## 2019-07-05 NOTE — Discharge Summary (Signed)
Discharge Summary    Patient ID: Karen West MRN: [id number]; DOB: 1940-05-11  Admit date: 07/02/2019 Discharge date: 07/05/2019  Primary Care Provider: Deland Pretty, MD  Primary Cardiologist: Minus Breeding, MD  Primary Electrophysiologist:  None   Discharge Diagnoses    Principal Problem:   NSTEMI (non-ST elevated myocardial infarction) [facility] Active Problems:   Type 2 diabetes mellitus with complication, without long-term current use of insulin ([place])   Essential hypertension   Hyperlipidemia   Diagnostic Studies/Procedures    Echo 07/03/2019 1. Left ventricular ejection fraction, by estimation, is 65 to 70%. The  left ventricle has normal function. The left ventricle has no regional  wall motion abnormalities. Left ventricular diastolic parameters are  consistent with Grade I diastolic  dysfunction (impaired relaxation). Elevated left ventricular end-diastolic  pressure.  2. Right ventricular systolic function is normal. The right ventricular  size is normal.  3. The mitral valve is normal in structure. Moderate mitral valve  regurgitation. No evidence of mitral stenosis.  4. Tricuspid valve regurgitation is mild to moderate.  5. The aortic valve is tricuspid. Aortic valve regurgitation is moderate.  Mild aortic valve stenosis. Aortic valve mean gradient measures 7.0 mmHg.  6. The inferior vena cava is normal in size with greater than 50%  respiratory variability, suggesting right atrial pressure of 3 mmHg.   Cardiac Catheterization 07/03/19  Prox LAD to Mid LAD lesion is 70% stenosed.  Prox LAD lesion is 40% stenosed.  Mid RCA to Dist RCA lesion is 80% stenosed.  A stent was successfully placed.  Dist RCA lesion is 95% stenosed.  Post intervention, there is a 0% residual stenosis.  Post intervention, there is a 0% residual stenosis.   There is severe mitral annular calcification.    The LAD has moderate diffuse calcification with 40 followed by  70% proximal stenosis prior to the takeoff of the first diagonal vessel with 20% mid stenosis.  Left circumflex vessel is tortuous and free of significant obstructive disease.  The RCA is a dominant vessel that had 80% diffuse stenosis in the region of the acute margin followed by 95% focal distal stenosis.  Transient 2-1/complete heart block with insertion of catheter into the LV with spontaneous resolution.  PCI performed via the femoral approach after transitioning from the radial approach which was associated with significant radial artery spasm.  Very difficult PCI with very difficult catheter backup with initial PTCA of the distal and acute margin stenoses with subsequent development of guiding catheter induced very proximal RCA dissection extending to the acute margin.  Utilizing telescope guidliner support from distal to near ostial for Resolute Onyx DES stents were inserted with the most distal stent being a 2.5 x 18 mm stent followed by 2.5 x 22 mm stent, 2.5 x 38 mm stent and the most proximal stent a 2.75 x 18 mm Resolute Onyx stent with an excellent angiographic result with brisk TIMI-3 flow and no evidence for residual dissection.  RECOMMENDATION: Patient will be hydrated post procedure.  Continue DAPT for minimum of 12 months.  Initiate medical therapy for ischemic disease.  May need future CSI atherectomy to the LAD lesion which is significantly calcified.  Aggressive lipid-lowering therapy.  Coronary Diagrams  Diagnostic Dominance: Right  Intervention     _____________   History of Present Illness     Karen West is a 79 y.o. 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 She was  seen in her PCPs office where she reported 2 weeks of intermittent chest pain which was worse with exertion and had associated SOB. While the patient was at her appointment she had recurrent chest pain and was sent to the ED.   Hospital Course     Consultants: None  Upon arrival to the ED the patient was given aspirin and found to have HS troponin at 26 with repeat at 52. CXR with no acute cardiopulmonary disease with mitral annulus calcification as well as aortic atherosclerosis. EKG with new complete RBBB and more pronounced TWI in leads V1, V3, and III. Patient denied orthopnea although did admit to dependent LE edema. CT chest from 01/2019 was reviewed which showed advanced atherosclerosis calcifications involving the thoracic aorta and coronary arteries as well as dense calcifications at the mitral valve annulus. The patient was started on IV heparin and admitted for likely cardiac catheterization. The next day the patient was taken to the cath lab which showed severe mitral annular calcification, LAD with moderate diffuse calcification with 40 followed by 70% proximal stenosis prior to the takeoff of the first diagonal vessel with 20% mid stenosis, tortuous left circumflex vessel, RCA with 80% diffuse stenosis. Patient was treated with complex RCA intervention with 4 overlapping drug-eluding stents secondary to guide catheter induced spiral dissection. Residual disease of the LAD 60-70% might require atherectomy in the future for now plan for medical management. Patient was noted to have 2-1 complete heart block after transitioning from the radial approach which was associated with significant radial artery spasm. Recommendations for DAPT with Aspirin and Brilinta for 12 months. Echo showed EF 65-70%, no WMA, G1DD, moderate MR, mild to moderate TR, moderate AR mean gradient 39mmHg. LDL came back at 61 on Crestor and Zetia. Creatinine came back at 1.36. Right radial site with slight bruising and right  femoral access site stable. Patient worked with cardiac rehab and was able to ambulate without symptoms. Plan to discharge patient with Aspirin and Brilinta x 12 months. Will send in new prescription for metoprolol. Continue Norvasc. Continue statin/zetia therapy. Hospital follow-up will be arranged along with follow-up labs to check creatinine.   The patient was evaluated on 07/05/19 by Dr. Warren Lacy and felt to be stable for discharge.   Did the patient have an acute coronary syndrome (MI, NSTEMI, STEMI, etc) this admission?:  Yes                               AHA/ACC Clinical Performance & Quality Measures: 1. Aspirin prescribed? - Yes 2. ADP Receptor Inhibitor (Plavix/Clopidogrel, Brilinta/Ticagrelor or Effient/Prasugrel) prescribed (includes medically managed patients)? - Yes 3. Beta Blocker prescribed? - Yes 4. High Intensity Statin (Lipitor 40-80mg  or Crestor 20-40mg ) prescribed? - Yes 5. EF assessed during THIS hospitalization? - Yes 6. For EF <40%, was ACEI/ARB prescribed? - Not Applicable (EF >/= 52%) 7. For EF <40%, Aldosterone Antagonist (Spironolactone or Eplerenone) prescribed? - Not Applicable (EF >/= 77%) 8. Cardiac Rehab Phase II ordered (Included Medically managed Patients)? - Yes   _____________  Discharge Vitals Blood pressure (!) 151/76, pulse 84, temperature 99.3 F (37.4 C), temperature source Oral, resp. rate 17, height 5\' 2"  (1.575 m), weight 63.5 kg, SpO2 98 %.  Filed Weights   07/03/19 0400 07/04/19  0254 07/05/19 0522  Weight: 66.8 kg 68.8 kg 63.5 kg    Labs & Radiologic Studies    CBC Recent Labs    07/03/19 0051 07/04/19 0351  WBC 6.6 7.8  HGB 10.3* 9.3*  HCT 31.6* 28.0*  MCV 85.9 84.1  PLT 328 607   Basic Metabolic Panel Recent Labs    07/04/19 0351 07/05/19 0541  NA 140 142  K 3.5 3.6  CL 108 108  CO2 20* 22  GLUCOSE 143* 138*  BUN 16 16  CREATININE 1.15* 1.36*  CALCIUM 8.6* 9.0   Liver Function Tests No results for input(s): AST,  ALT, ALKPHOS, BILITOT, PROT, ALBUMIN in the last 72 hours. No results for input(s): LIPASE, AMYLASE in the last 72 hours. High Sensitivity Troponin:   Recent Labs  Lab 07/02/19 1047 07/02/19 1403  TROPONINIHS 26* 52*    BNP Invalid input(s): POCBNP D-Dimer No results for input(s): DDIMER in the last 72 hours. Hemoglobin A1C No results for input(s): HGBA1C in the last 72 hours. Fasting Lipid Panel Recent Labs    07/03/19 0051  CHOL 121  HDL 37*  LDLCALC 61  TRIG 113  CHOLHDL 3.3   Thyroid Function Tests No results for input(s): TSH, T4TOTAL, T3FREE, THYROIDAB in the last 72 hours.  Invalid input(s): FREET3 _____________  DG Chest 2 View  Result Date: 07/02/2019 CLINICAL DATA:  Chest pain EXAM: CHEST - 2 VIEW COMPARISON:  October 30, 2017 FINDINGS: Postoperative changes noted on the left with scarring and volume loss on the left, stable. No edema or airspace opacity. Heart size and pulmonary vascularity are stable and within normal limits for postoperative change on the left. There is mitral annulus calcification as well as aortic atherosclerosis. No adenopathy evident. Surgical clips are noted in the right axillary region. No appreciable bone lesions. Surgical clips are noted in the thyroid region. IMPRESSION: Postoperative changes on left with scarring and volume loss, stable. No edema or airspace opacity. Stable cardiac silhouette. Postoperative changes noted as well in the thyroid and right axillary regions. No adenopathy. Aortic Atherosclerosis (ICD10-I70.0). Electronically Signed   By: Lowella Grip III M.D.   On: 07/02/2019 11:13   CARDIAC CATHETERIZATION  Result Date: 07/03/2019  Prox LAD to Mid LAD lesion is 70% stenosed.  Prox LAD lesion is 40% stenosed.  Mid RCA to Dist RCA lesion is 80% stenosed.  A stent was successfully placed.  Dist RCA lesion is 95% stenosed.  Post intervention, there is a 0% residual stenosis.  Post intervention, there is a 0% residual  stenosis.  There is severe mitral annular calcification.  The LAD has moderate diffuse calcification with 40 followed by 70% proximal stenosis prior to the takeoff of the first diagonal vessel with 20% mid stenosis. Left circumflex vessel is tortuous and free of significant obstructive disease. The RCA is a dominant vessel that had 80% diffuse stenosis in the region of the acute margin followed by 95% focal distal stenosis. Transient 2-1/complete heart block with insertion of catheter into the LV with spontaneous resolution. PCI performed via the femoral approach after transitioning from the radial approach which was associated with significant radial artery spasm. Very difficult PCI with very difficult catheter backup with initial PTCA of the distal and acute margin stenoses with subsequent development of guiding catheter induced very proximal RCA dissection extending to the acute margin.  Utilizing telescope guidliner support from distal to near ostial for Resolute Onyx DES stents were inserted with the most distal stent being a 2.5  x 18 mm stent followed by 2.5 x 22 mm stent, 2.5 x 38 mm stent and the most proximal stent a 2.75 x 18 mm Resolute Onyx stent with an excellent angiographic result with brisk TIMI-3 flow and no evidence for residual dissection. RECOMMENDATION: Patient will be hydrated post procedure.  Continue DAPT for minimum of 12 months.  Initiate medical therapy for ischemic disease.  May need future CSI atherectomy to the LAD lesion which is significantly calcified.  Aggressive lipid-lowering therapy.   ECHOCARDIOGRAM COMPLETE  Result Date: 07/03/2019    ECHOCARDIOGRAM REPORT   Patient Name:   Karen West Date of Exam: 07/03/2019 Medical Rec #:  [id number]        Height:       62.0 in Accession #:    [id number]       Weight:       147.3 lb Date of Birth:  08-13-1940         BSA:          1.679 m Patient Age:    54 years         BP:           158/74 mmHg Patient Gender: F                HR:            77 bpm. Exam Location:  Inpatient Procedure: 2D Echo STAT ECHO Indications:    chest pain 786.50  History:        Patient has prior history of Echocardiogram examinations, most                 recent 06/09/2017.  Sonographer:    Johny Chess Referring Phys: [place]  1. Left ventricular ejection fraction, by estimation, is 65 to 70%. The left ventricle has normal function. The left ventricle has no regional wall motion abnormalities. Left ventricular diastolic parameters are consistent with Grade I diastolic dysfunction (impaired relaxation). Elevated left ventricular end-diastolic pressure.  2. Right ventricular systolic function is normal. The right ventricular size is normal.  3. The mitral valve is normal in structure. Moderate mitral valve regurgitation. No evidence of mitral stenosis.  4. Tricuspid valve regurgitation is mild to moderate.  5. The aortic valve is tricuspid. Aortic valve regurgitation is moderate. Mild aortic valve stenosis. Aortic valve mean gradient measures 7.0 mmHg.  6. The inferior vena cava is normal in size with greater than 50% respiratory variability, suggesting right atrial pressure of 3 mmHg. FINDINGS  Left Ventricle: Left ventricular ejection fraction, by estimation, is 65 to 70%. The left ventricle has normal function. The left ventricle has no regional wall motion abnormalities. The left ventricular internal cavity size was normal in size. There is  no left ventricular hypertrophy. Left ventricular diastolic parameters are consistent with Grade I diastolic dysfunction (impaired relaxation). Elevated left ventricular end-diastolic pressure. Right Ventricle: The right ventricular size is normal. No increase in right ventricular wall thickness. Right ventricular systolic function is normal. Left Atrium: Left atrial size was normal in size. Right Atrium: Right atrial size was normal in size. Pericardium: There is no evidence of pericardial effusion.  Mitral Valve: The mitral valve is normal in structure. There is moderate thickening of the mitral valve leaflet(s). Normal mobility of the mitral valve leaflets. Moderate mitral annular calcification. Moderate mitral valve regurgitation. No evidence of mitral valve stenosis. MV peak gradient, 11.6 mmHg. The mean mitral valve gradient is 4.0 mmHg. Tricuspid Valve:  The tricuspid valve is normal in structure. Tricuspid valve regurgitation is mild to moderate. No evidence of tricuspid stenosis. Aortic Valve: The aortic valve is tricuspid. . There is severe thickening and severe calcifcation of the aortic valve. Aortic valve regurgitation is moderate. Aortic regurgitation PHT measures 416 msec. Mild aortic stenosis is present. There is severe thickening of the aortic valve. There is severe calcifcation of the aortic valve. Aortic valve mean gradient measures 7.0 mmHg. Pulmonic Valve: The pulmonic valve was normal in structure. Pulmonic valve regurgitation is not visualized. No evidence of pulmonic stenosis. Aorta: The aortic root is normal in size and structure. Venous: The inferior vena cava is normal in size with greater than 50% respiratory variability, suggesting right atrial pressure of 3 mmHg. IAS/Shunts: No atrial level shunt detected by color flow Doppler.  LEFT VENTRICLE PLAX 2D LVIDd:         3.60 cm  Diastology LVIDs:         2.70 cm  LV e' lateral:   5.00 cm/s LV PW:         1.10 cm  LV E/e' lateral: 11.2 LV IVS:        0.90 cm  LV e' medial:    2.94 cm/s LVOT diam:     1.80 cm  LV E/e' medial:  19.0 LV SV:         58 LV SV Index:   34 LVOT Area:     2.54 cm  RIGHT VENTRICLE RV S prime:     9.14 cm/s TAPSE (M-mode): 1.5 cm LEFT ATRIUM             Index LA diam:        3.20 cm 1.91 cm/m LA Vol (A2C):   45.6 ml 27.17 ml/m LA Vol (A4C):   48.0 ml 28.60 ml/m LA Biplane Vol: 46.9 ml 27.94 ml/m  AORTIC VALVE AV Mean Grad: 7.0 mmHg LVOT Vmax:    121.00 cm/s LVOT Vmean:   77.700 cm/s LVOT VTI:     0.227 m AI  PHT:       416 msec  AORTA Ao Root diam: 2.80 cm Ao Asc diam:  3.30 cm MITRAL VALVE MV Peak grad: 11.6 mmHg     SHUNTS MV Mean grad: 4.0 mmHg      Systemic VTI:  0.23 m MV Vmax:      1.70 m/s      Systemic Diam: 1.80 cm MV Vmean:     85.7 cm/s MV E velocity: 56.00 cm/s MV A velocity: 140.00 cm/s MV E/A ratio:  0.40 Ena Dawley MD Electronically signed by Ena Dawley MD Signature Date/Time: 07/03/2019/5:33:02 PM    Final    Disposition   Pt is being discharged home today in good condition.  Follow-up Plans & Appointments    Follow-up Information    [facility] Follow up on 07/11/2019.   Specialty: Cardiology Why: Plaese go to [facility] for labs 07/11/19 from 7:30AM to 4:30 PM Contact information: [address] [place] [phone]       Minus Breeding, MD Follow up on 07/05/2019.   Specialty: Cardiology Why: The office will call to arrange hospital follow-up within 7-10 days. Please call the office if you do not hear from them next week.  Contact information: [address] [place] [address] [phone]          Discharge Instructions    Amb Referral to Cardiac Rehabilitation  Complete by: As directed    Diagnosis:  Coronary Stents NSTEMI PTCA     After initial evaluation and assessments completed: Virtual Based Care may be provided alone or in conjunction with Phase 2 Cardiac Rehab based on patient barriers.: Yes      Discharge Medications   Allergies as of 07/05/2019      Reactions   Tramadol Nausea And Vomiting, Other (See Comments)   "got sick to my stomach and was throwing up blood; it put me in the hospital")   Lisinopril Rash, Other (See Comments)   Renal failure   Tapazole [methimazole] Itching, Rash      Medication List    TAKE these medications   acetaminophen 500 MG tablet Commonly known as: TYLENOL Take 1,000 mg by mouth every 6 (six) hours as needed for  moderate pain or headache.   amLODipine 10 MG tablet Commonly known as: NORVASC Take 10 mg by mouth daily.   aspirin 81 MG tablet Take 81 mg by mouth daily.   B-12 [phone] MCG Subl Place 1 tablet under the tongue daily.   ezetimibe 10 MG tablet Commonly known as: ZETIA Take 10 mg by mouth every evening.   ferrous sulfate 325 (65 FE) MG tablet Take 325 mg by mouth 2 (two) times daily.   Fish Oil 1200 MG Caps Take 1,200 mg by mouth 2 (two) times daily.   hydrocortisone cream 1 % Apply 1 application topically 2 (two) times daily as needed for itching.   Jardiance 25 MG Tabs tablet Generic drug: empagliflozin Take 25 mg by mouth daily.   levothyroxine 88 MCG tablet Commonly known as: SYNTHROID Take 88 mcg by mouth daily.   linagliptin 5 MG Tabs tablet Commonly known as: TRADJENTA Take 5 mg by mouth daily.   Magnesium Oxide 400 (240 Mg) MG Tabs Take 400 mg by mouth 2 (two) times daily.   metFORMIN 1000 MG tablet Commonly known as: GLUCOPHAGE Take 1,000 mg by mouth 2 (two) times daily.   metoprolol tartrate 25 MG tablet Commonly known as: LOPRESSOR Take 1 tablet (25 mg total) by mouth 2 (two) times daily.   NONFORMULARY OR COMPOUNDED ITEM [facility]:  Achilles Tendonitis #12 - Diclofenac 3%, Baclofen 2%, Bupivacaine 1%, Gabapentin 6%, Pentoxifylline 3%, Topiramate 1%. Apply 1-2 grams to affected area 3-4 times daily.   omeprazole 20 MG capsule Commonly known as: PRILOSEC Take 20 mg by mouth 2 (two) times daily. Takes prn   rosuvastatin 20 MG tablet Commonly known as: CRESTOR Take 20 mg by mouth every evening.   ticagrelor 90 MG Tabs tablet Commonly known as: BRILINTA Take 1 tablet (90 mg total) by mouth 2 (two) times daily.          Outstanding Labs/Studies   BMET in 1 week  Duration of Discharge Encounter   Greater than 30 minutes including physician time.  Signed, Brittain Smithey Ninfa Meeker, PA-C 07/05/2019, 1:34 PM

## 2019-07-05 NOTE — Progress Notes (Signed)
Progress Note  Patient Name: Karen West Date of Encounter: 07/05/2019  Primary Cardiologist:   Minus Breeding, MD   Subjective   No pain.  No SOB.  Ambulated in the hallways  Inpatient Medications    Scheduled Meds: . amLODipine  10 mg Oral Daily  . aspirin EC  81 mg Oral Daily  . ezetimibe  10 mg Oral QPM  . heparin  5,000 Units Subcutaneous Q8H  . insulin aspart  0-15 Units Subcutaneous TID WC  . levothyroxine  88 mcg Oral Q0600  . metoprolol tartrate  25 mg Oral BID  . rosuvastatin  20 mg Oral QPM  . sodium chloride flush  3 mL Intravenous Q12H  . sodium chloride flush  3 mL Intravenous Q12H  . ticagrelor  90 mg Oral BID   Continuous Infusions: . sodium chloride Stopped (07/04/19 0349)  . sodium chloride     PRN Meds: sodium chloride, acetaminophen, diazepam, ondansetron (ZOFRAN) IV, sodium chloride flush   Vital Signs    Vitals:   07/04/19 1954 07/04/19 2126 07/05/19 0522 07/05/19 0856  BP: (!) 153/63  (!) 179/69 (!) 151/76  Pulse: 73 72 84 84  Resp: 15  16 17   Temp: 98.2 F (36.8 C)  99.3 F (37.4 C)   TempSrc: Oral  Oral   SpO2: 100%  100% 98%  Weight:   63.5 kg   Height:        Intake/Output Summary (Last 24 hours) at 07/05/2019 1228 Last data filed at 07/05/2019 0900 Gross per 24 hour  Intake 480 ml  Output 1400 ml  Net -920 ml   Filed Weights   07/03/19 0400 07/04/19 0254 07/05/19 0522  Weight: 66.8 kg 68.8 kg 63.5 kg    Telemetry    NSR - Personally Reviewed  ECG    na - Personally Reviewed  Physical Exam   GEN: No acute distress.   Neck: No  JVD Cardiac: RRR, No murmurs, rubs, or gallops.  Respiratory: Clear  to auscultation bilaterally. GI: Soft, nontender, non-distended  MS: No  edema; No deformity.  Right radial with slight bruising.  Right femoral access site OK.  Neuro:  Nonfocal  Psych: Normal affect   Labs    Chemistry Recent Labs  Lab 07/02/19 1047 07/02/19 1121 07/03/19 0051 07/04/19 0351  07/05/19 0541  NA   < >  --  141 140 142  K   < >  --  3.2* 3.5 3.6  CL   < >  --  105 108 108  CO2   < >  --  24 20* 22  GLUCOSE   < >  --  98 143* 138*  BUN   < >  --  24* 16 16  CREATININE   < >  --  1.15* 1.15* 1.36*  CALCIUM   < >  --  9.3 8.6* 9.0  PROT  --  7.6  --   --   --   ALBUMIN  --  3.5  --   --   --   AST  --  27  --   --   --   ALT  --  32  --   --   --   ALKPHOS  --  71  --   --   --   BILITOT  --  0.9  --   --   --   GFRNONAA   < >  --  46* 46* 38*  GFRAA   < >  --  54* 54* 44*  ANIONGAP   < >  --  12 12 12    < > = values in this interval not displayed.     Hematology Recent Labs  Lab 07/02/19 1047 07/03/19 0051 07/04/19 0351  WBC 6.8 6.6 7.8  RBC 4.12 3.68* 3.33*  HGB 11.1* 10.3* 9.3*  HCT 35.8* 31.6* 28.0*  MCV 86.9 85.9 84.1  MCH 26.9 28.0 27.9  MCHC 31.0 32.6 33.2  RDW 14.1 14.3 14.4  PLT 343 328 293    Cardiac EnzymesNo results for input(s): TROPONINI in the last 168 hours. No results for input(s): TROPIPOC in the last 168 hours.   BNPNo results for input(s): BNP, PROBNP in the last 168 hours.   DDimer No results for input(s): DDIMER in the last 168 hours.   Radiology    ECHOCARDIOGRAM COMPLETE  Result Date: 07/03/2019    ECHOCARDIOGRAM REPORT   Patient Name:   Karen West Date of Exam: 07/03/2019 Medical Rec #:  [id number]        Height:       62.0 in Accession #:    [id number]       Weight:       147.3 lb Date of Birth:  Aug 28, 1940         BSA:          1.679 m Patient Age:    79 years         BP:           158/74 mmHg Patient Gender: F                HR:           77 bpm. Exam Location:  Inpatient Procedure: 2D Echo STAT ECHO Indications:    chest pain 786.50  History:        Patient has prior history of Echocardiogram examinations, most                 recent 06/09/2017.  Sonographer:    Johny Chess Referring Phys: [place]  1. Left ventricular ejection fraction, by estimation, is 65 to 70%. The left ventricle  has normal function. The left ventricle has no regional wall motion abnormalities. Left ventricular diastolic parameters are consistent with Grade I diastolic dysfunction (impaired relaxation). Elevated left ventricular end-diastolic pressure.  2. Right ventricular systolic function is normal. The right ventricular size is normal.  3. The mitral valve is normal in structure. Moderate mitral valve regurgitation. No evidence of mitral stenosis.  4. Tricuspid valve regurgitation is mild to moderate.  5. The aortic valve is tricuspid. Aortic valve regurgitation is moderate. Mild aortic valve stenosis. Aortic valve mean gradient measures 7.0 mmHg.  6. The inferior vena cava is normal in size with greater than 50% respiratory variability, suggesting right atrial pressure of 3 mmHg. FINDINGS  Left Ventricle: Left ventricular ejection fraction, by estimation, is 65 to 70%. The left ventricle has normal function. The left ventricle has no regional wall motion abnormalities. The left ventricular internal cavity size was normal in size. There is  no left ventricular hypertrophy. Left ventricular diastolic parameters are consistent with Grade I diastolic dysfunction (impaired relaxation). Elevated left ventricular end-diastolic pressure. Right Ventricle: The right ventricular size is normal. No increase in right ventricular wall thickness. Right ventricular systolic function is normal. Left Atrium: Left atrial size was normal in size. Right Atrium: Right atrial size was normal in size. Pericardium: There is no evidence of pericardial  effusion. Mitral Valve: The mitral valve is normal in structure. There is moderate thickening of the mitral valve leaflet(s). Normal mobility of the mitral valve leaflets. Moderate mitral annular calcification. Moderate mitral valve regurgitation. No evidence of mitral valve stenosis. MV peak gradient, 11.6 mmHg. The mean mitral valve gradient is 4.0 mmHg. Tricuspid Valve: The tricuspid valve is  normal in structure. Tricuspid valve regurgitation is mild to moderate. No evidence of tricuspid stenosis. Aortic Valve: The aortic valve is tricuspid. . There is severe thickening and severe calcifcation of the aortic valve. Aortic valve regurgitation is moderate. Aortic regurgitation PHT measures 416 msec. Mild aortic stenosis is present. There is severe thickening of the aortic valve. There is severe calcifcation of the aortic valve. Aortic valve mean gradient measures 7.0 mmHg. Pulmonic Valve: The pulmonic valve was normal in structure. Pulmonic valve regurgitation is not visualized. No evidence of pulmonic stenosis. Aorta: The aortic root is normal in size and structure. Venous: The inferior vena cava is normal in size with greater than 50% respiratory variability, suggesting right atrial pressure of 3 mmHg. IAS/Shunts: No atrial level shunt detected by color flow Doppler.  LEFT VENTRICLE PLAX 2D LVIDd:         3.60 cm  Diastology LVIDs:         2.70 cm  LV e' lateral:   5.00 cm/s LV PW:         1.10 cm  LV E/e' lateral: 11.2 LV IVS:        0.90 cm  LV e' medial:    2.94 cm/s LVOT diam:     1.80 cm  LV E/e' medial:  19.0 LV SV:         58 LV SV Index:   34 LVOT Area:     2.54 cm  RIGHT VENTRICLE RV S prime:     9.14 cm/s TAPSE (M-mode): 1.5 cm LEFT ATRIUM             Index LA diam:        3.20 cm 1.91 cm/m LA Vol (A2C):   45.6 ml 27.17 ml/m LA Vol (A4C):   48.0 ml 28.60 ml/m LA Biplane Vol: 46.9 ml 27.94 ml/m  AORTIC VALVE AV Mean Grad: 7.0 mmHg LVOT Vmax:    121.00 cm/s LVOT Vmean:   77.700 cm/s LVOT VTI:     0.227 m AI PHT:       416 msec  AORTA Ao Root diam: 2.80 cm Ao Asc diam:  3.30 cm MITRAL VALVE MV Peak grad: 11.6 mmHg     SHUNTS MV Mean grad: 4.0 mmHg      Systemic VTI:  0.23 m MV Vmax:      1.70 m/s      Systemic Diam: 1.80 cm MV Vmean:     85.7 cm/s MV E velocity: 56.00 cm/s MV A velocity: 140.00 cm/s MV E/A ratio:  0.40 Ena Dawley MD Electronically signed by Ena Dawley MD Signature  Date/Time: 07/03/2019/5:33:02 PM    Final     Cardiac Studies   Diagnostic Dominance: Right  Intervention   ECHO:   1. Left ventricular ejection fraction, by estimation, is 65 to 70%. The  left ventricle has normal function. The left ventricle has no regional  wall motion abnormalities. Left ventricular diastolic parameters are  consistent with Grade I diastolic  dysfunction (impaired relaxation). Elevated left ventricular end-diastolic  pressure.  2. Right ventricular systolic function is normal. The right ventricular  size is normal.  3. The  mitral valve is normal in structure. Moderate mitral valve  regurgitation. No evidence of mitral stenosis.  4. Tricuspid valve regurgitation is mild to moderate.  5. The aortic valve is tricuspid. Aortic valve regurgitation is moderate.  Mild aortic valve stenosis. Aortic valve mean gradient measures 7.0 mmHg.  6. The inferior vena cava is normal in size with greater than 50%  respiratory variability, suggesting right atrial pressure of 3 mmHg.   Patient Profile     79 y.o. 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  Assessment & Plan    NSTEMI:  Anatomy as above.   Creat is up slightly.  Will need close follow up with 7 day TOC and BMET.      HTN:    Metoprolol increased yesterday.  BP is still slightly elevated.  However, given increased creat and new beta blocker I would ask her to take meds as on MAR and keep a BP diary to be reviewed at the follow up.    DYSLIPIDEMIA:   Continue current therapy.       For questions or updates, please contact [place] Please consult [email or web address] for contact info under Cardiology/STEMI.   Signed, Minus Breeding, MD    07/05/2019, 12:28 PM

## 2019-07-05 NOTE — Progress Notes (Unsigned)
bmet  

## 2019-07-07 ENCOUNTER — Telehealth: Payer: Self-pay | Admitting: Medical

## 2019-07-07 ENCOUNTER — Telehealth (HOSPITAL_COMMUNITY): Payer: Self-pay

## 2019-07-07 NOTE — Telephone Encounter (Signed)
Pt insurance is active and benefits verified through Medicare A/B. Co-pay $0.00, DED $203.00/$203.00 met, out of pocket $0.00/$0.00 met, co-insurance 20%. No pre-authorization required. Passport, 07/07/19 @ 3:11PM, [id number]  2ndary insurance is active and benefits verified through [employer]. Co-pay $0.00, DED $0.00/$0.00 met, out of pocket $0.00/$0.00 met, co-insurance 0%. No pre-authorization required.   Will contact patient to see if she is interested in the Cardiac Rehab Program. If interested, patient will need to complete follow up appt. Once completed, patient will be contacted for scheduling upon review by the RN Navigator.

## 2019-07-07 NOTE — Telephone Encounter (Signed)
-----   Message from [place], PA-C sent at 07/05/2019  1:26 PM EDT ----- Regarding: TOC apt and phone call This patient is being discharged from the hospital today for NSTEMI s/p DES x 4. She needs a TOC follow-up apt in 7-10 days and a TOC phone call. I also put in orders for a BMET in 1 week. Please schedule this as well.   Thanks, Cadence

## 2019-07-07 NOTE — Telephone Encounter (Signed)
Called patient to see if she is interested in the Cardiac Rehab Program. Patient expressed interest. Explained scheduling process and went over insurance, patient verbalized understanding. Will contact patient for scheduling once f/u has been completed. 

## 2019-07-07 NOTE — Telephone Encounter (Signed)
Routed to scheduling team to arrange TOC f/u

## 2019-07-08 ENCOUNTER — Other Ambulatory Visit: Payer: Self-pay

## 2019-07-08 ENCOUNTER — Ambulatory Visit (INDEPENDENT_AMBULATORY_CARE_PROVIDER_SITE_OTHER): Payer: Medicare Other | Admitting: Podiatry

## 2019-07-08 ENCOUNTER — Encounter: Payer: Self-pay | Admitting: Podiatry

## 2019-07-08 VITALS — Temp 96.1°F

## 2019-07-08 DIAGNOSIS — M79674 Pain in right toe(s): Secondary | ICD-10-CM | POA: Diagnosis not present

## 2019-07-08 DIAGNOSIS — B351 Tinea unguium: Secondary | ICD-10-CM | POA: Diagnosis not present

## 2019-07-08 DIAGNOSIS — M79675 Pain in left toe(s): Secondary | ICD-10-CM

## 2019-07-08 NOTE — Patient Instructions (Addendum)
Peripheral Neuropathy Peripheral neuropathy is a type of nerve damage. It affects nerves that carry signals between the spinal cord and the arms, legs, and the rest of the body (peripheral nerves). It does not affect nerves in the spinal cord or brain. In peripheral neuropathy, one nerve or a group of nerves may be damaged. Peripheral neuropathy is a broad category that includes many specific nerve disorders, like diabetic neuropathy, hereditary neuropathy, and carpal tunnel syndrome. What are the causes? This condition may be caused by:  Diabetes. This is the most common cause of peripheral neuropathy.  Nerve injury.  Pressure or stress on a nerve that lasts a long time.  Lack (deficiency) of B vitamins. This can result from alcoholism, poor diet, or a restricted diet.  Infections.  Autoimmune diseases, such as rheumatoid arthritis and systemic lupus erythematosus.  Nerve diseases that are passed from parent to child (inherited).  Some medicines, such as cancer medicines (chemotherapy).  Poisonous (toxic) substances, such as lead and mercury.  Too little blood flowing to the legs.  Kidney disease.  Thyroid disease. In some cases, the cause of this condition is not known. What are the signs or symptoms? Symptoms of this condition depend on which of your nerves is damaged. Common symptoms include:  Loss of feeling (numbness) in the feet, hands, or both.  Tingling in the feet, hands, or both.  Burning pain.  Very sensitive skin.  Weakness.  Not being able to move a part of the body (paralysis).  Muscle twitching.  Clumsiness or poor coordination.  Loss of balance.  Not being able to control your bladder.  Feeling dizzy.  Sexual problems. How is this diagnosed? Diagnosing and finding the cause of peripheral neuropathy can be difficult. Your health care provider will take your medical history and do a physical exam. A neurological exam will also be done. This  involves checking things that are affected by your brain, spinal cord, and nerves (nervous system). For example, your health care provider will check your reflexes, how you move, and what you can feel. You may have other tests, such as:  Blood tests.  Electromyogram (EMG) and nerve conduction tests. These tests check nerve function and how well the nerves are controlling the muscles.  Imaging tests, such as CT scans or MRI to rule out other causes of your symptoms.  Removing a small piece of nerve to be examined in a lab (nerve biopsy). This is rare.  Removing and examining a small amount of the fluid that surrounds the brain and spinal cord (lumbar puncture). This is rare. How is this treated? Treatment for this condition may involve:  Treating the underlying cause of the neuropathy, such as diabetes, kidney disease, or vitamin deficiencies.  Stopping medicines that can cause neuropathy, such as chemotherapy.  Medicine to relieve pain. Medicines may include: ? Prescription or over-the-counter pain medicine. ? Antiseizure medicine. ? Antidepressants. ? Pain-relieving patches that are applied to painful areas of skin.  Surgery to relieve pressure on a nerve or to destroy a nerve that is causing pain.  Physical therapy to help improve movement and balance.  Devices to help you move around (assistive devices). Follow these instructions at home: Medicines  Take over-the-counter and prescription medicines only as told by your health care provider. Do not take any other medicines without first asking your health care provider.  Do not drive or use heavy machinery while taking prescription pain medicine. Lifestyle   Do not use any products that contain nicotine  or tobacco, such as cigarettes and e-cigarettes. Smoking keeps blood from reaching damaged nerves. If you need help quitting, ask your health care provider.  Avoid or limit alcohol. Too much alcohol can cause a vitamin B  deficiency, and vitamin B is needed for healthy nerves.  Eat a healthy diet. This includes: ? Eating foods that are high in fiber, such as fresh fruits and vegetables, whole grains, and beans. ? Limiting foods that are high in fat and processed sugars, such as fried or sweet foods. General instructions   If you have diabetes, work closely with your health care provider to keep your blood sugar under control.  If you have numbness in your feet: ? Check every day for signs of injury or infection. Watch for redness, warmth, and swelling. ? Wear padded socks and comfortable shoes. These help protect your feet.  Develop a good support system. Living with peripheral neuropathy can be stressful. Consider talking with a mental health specialist or joining a support group.  Use assistive devices and attend physical therapy as told by your health care provider. This may include using a walker or a cane.  Keep all follow-up visits as told by your health care provider. This is important. Contact a health care provider if:  You have new signs or symptoms of peripheral neuropathy.  You are struggling emotionally from dealing with peripheral neuropathy.  Your pain is not well-controlled. Get help right away if:  You have an injury or infection that is not healing normally.  You develop new weakness in an arm or leg.  You fall frequently. Summary  Peripheral neuropathy is when the nerves in the arms, or legs are damaged, resulting in numbness, weakness, or pain.  There are many causes of peripheral neuropathy, including diabetes, pinched nerves, vitamin deficiencies, autoimmune disease, and hereditary conditions.  Diagnosing and finding the cause of peripheral neuropathy can be difficult. Your health care provider will take your medical history, do a physical exam, and do tests, including blood tests and nerve function tests.  Treatment involves treating the underlying cause of the  neuropathy and taking medicines to help control pain. Physical therapy and assistive devices may also help. This information is not intended to replace advice given to you by your health care provider. Make sure you discuss any questions you have with your health care provider. Document Revised: 02/16/2017 Document Reviewed: 05/15/2016 Elsevier Patient Education  [place].  Diabetes Mellitus and Burden care is an important part of your health, especially when you have diabetes. Diabetes may cause you to have problems because of poor blood flow (circulation) to your feet and legs, which can cause your skin to:  Become thinner and drier.  Break more easily.  Heal more slowly.  Peel and crack. You may also have nerve damage (neuropathy) in your legs and feet, causing decreased feeling in them. This means that you may not notice minor injuries to your feet that could lead to more serious problems. Noticing and addressing any potential problems early is the best way to prevent future foot problems. How to care for your feet Foot hygiene  Wash your feet daily with warm water and mild soap. Do not use hot water. Then, pat your feet and the areas between your toes until they are completely dry. Do not soak your feet as this can dry your skin.  Trim your toenails straight across. Do not dig under them or around the cuticle. File the edges of your  nails with an Tax adviser or nail file.  Apply a moisturizing lotion or petroleum jelly to the skin on your feet and to dry, brittle toenails. Use lotion that does not contain alcohol and is unscented. Do not apply lotion between your toes. Shoes and socks  Wear clean socks or stockings every day. Make sure they are not too tight. Do not wear knee-high stockings since they may decrease blood flow to your legs.  Wear shoes that fit properly and have enough cushioning. Always look in your shoes before you put them on to be sure there are  no objects inside.  To break in new shoes, wear them for just a few hours a day. This prevents injuries on your feet. Wounds, scrapes, corns, and calluses  Check your feet daily for blisters, cuts, bruises, sores, and redness. If you cannot see the bottom of your feet, use a mirror or ask someone for help.  Do not cut corns or calluses or try to remove them with medicine.  If you find a minor scrape, cut, or break in the skin on your feet, keep it and the skin around it clean and dry. You may clean these areas with mild soap and water. Do not clean the area with peroxide, alcohol, or iodine.  If you have a wound, scrape, corn, or callus on your foot, look at it several times a day to make sure it is healing and not infected. Check for: ? Redness, swelling, or pain. ? Fluid or blood. ? Warmth. ? Pus or a bad smell. General instructions  Do not cross your legs. This may decrease blood flow to your feet.  Do not use heating pads or hot water bottles on your feet. They may burn your skin. If you have lost feeling in your feet or legs, you may not know this is happening until it is too late.  Protect your feet from hot and cold by wearing shoes, such as at the beach or on hot pavement.  Schedule a complete foot exam at least once a year (annually) or more often if you have foot problems. If you have foot problems, report any cuts, sores, or bruises to your health care provider immediately. Contact a health care provider if:  You have a medical condition that increases your risk of infection and you have any cuts, sores, or bruises on your feet.  You have an injury that is not healing.  You have redness on your legs or feet.  You feel burning or tingling in your legs or feet.  You have pain or cramps in your legs and feet.  Your legs or feet are numb.  Your feet always feel cold.  You have pain around a toenail. Get help right away if:  You have a wound, scrape, corn, or callus  on your foot and: ? You have pain, swelling, or redness that gets worse. ? You have fluid or blood coming from the wound, scrape, corn, or callus. ? Your wound, scrape, corn, or callus feels warm to the touch. ? You have pus or a bad smell coming from the wound, scrape, corn, or callus. ? You have a fever. ? You have a red line going up your leg. Summary  Check your feet every day for cuts, sores, red spots, swelling, and blisters.  Moisturize feet and legs daily.  Wear shoes that fit properly and have enough cushioning.  If you have foot problems, report any cuts, sores, or bruises to  your health care provider immediately.  Schedule a complete foot exam at least once a year (annually) or more often if you have foot problems. This information is not intended to replace advice given to you by your health care provider. Make sure you discuss any questions you have with your health care provider. Document Revised: 11/27/2018 Document Reviewed: 04/07/2016 Elsevier Patient Education  [place]? An infection that lies within the keratin of your nail plate that is caused by a fungus.  WHY ME? Fungal infections affect all ages, sexes, races, and creeds.  There may be many factors that predispose you to a fungal infection such as age, coexisting medical conditions such as diabetes, or an autoimmune disease; stress, medications, fatigue, genetics, etc.  Bottom line: fungus thrives in a warm, moist environment and your shoes offer such a location.  IS IT CONTAGIOUS? Theoretically, yes.  You do not want to share shoes, nail clippers or files with someone who has fungal toenails.  Walking around barefoot in the same room or sleeping in the same bed is unlikely to transfer the organism.  It is important to realize, however, that fungus can spread easily from one nail to the next on the same foot.  HOW DO WE TREAT THIS?  There are several ways to  treat this condition.  Treatment may depend on many factors such as age, medications, pregnancy, liver and kidney conditions, etc.  It is best to ask your doctor which options are available to you.  17. No treatment.   Unlike many other medical concerns, you can live with this condition.  However for many people this can be a painful condition and may lead to ingrown toenails or a bacterial infection.  It is recommended that you keep the nails cut short to help reduce the amount of fungal nail. 18. Topical treatment.  These range from herbal remedies to prescription strength nail lacquers.  About 40-50% effective, topicals require twice daily application for approximately 9 to 12 months or until an entirely new nail has grown out.  The most effective topicals are medical grade medications available through physicians offices. 19. Oral antifungal medications.  With an 80-90% cure rate, the most common oral medication requires 3 to 4 months of therapy and stays in your system for a year as the new nail grows out.  Oral antifungal medications do require blood work to make sure it is a safe drug for you.  A liver function panel will be performed prior to starting the medication and after the first month of treatment.  It is important to have the blood work performed to avoid any harmful side effects.  In general, this medication safe but blood work is required. 20. Laser Therapy.  This treatment is performed by applying a specialized laser to the affected nail plate.  This therapy is noninvasive, fast, and non-painful.  It is not covered by insurance and is therefore, out of pocket.  The results have been very good with a 80-95% cure rate.  [facility] is the only practice in the area to offer this therapy. 21. Permanent Nail Avulsion.  Removing the entire nail so that a new nail will not grow back.

## 2019-07-09 ENCOUNTER — Other Ambulatory Visit: Payer: Self-pay | Admitting: *Deleted

## 2019-07-09 DIAGNOSIS — I10 Essential (primary) hypertension: Secondary | ICD-10-CM | POA: Diagnosis not present

## 2019-07-09 DIAGNOSIS — I214 Non-ST elevation (NSTEMI) myocardial infarction: Secondary | ICD-10-CM | POA: Diagnosis not present

## 2019-07-09 DIAGNOSIS — E785 Hyperlipidemia, unspecified: Secondary | ICD-10-CM | POA: Diagnosis not present

## 2019-07-09 DIAGNOSIS — E1165 Type 2 diabetes mellitus with hyperglycemia: Secondary | ICD-10-CM | POA: Diagnosis not present

## 2019-07-09 NOTE — Patient Outreach (Signed)
[facility]) Care Management  07/09/2019  ZAREAH HUNZEKER [birth year] [id number]   EMMI-GENERAL DISCHARGE RED ON EMMI ALERT Day #1 Date: 07/07/2019 Red Alert Reason: QUESTIONS ABOUT d/c PAPERWORK AND QUESTIONS/CONCERNS  OUTREACH #1 RN attempted outreach call however unsuccessful and unable to leave a message. Will rescheduled another call attempt over the next week.  Raina Mina, RN Care Management Coordinator [facility] [phone]

## 2019-07-10 ENCOUNTER — Other Ambulatory Visit: Payer: Self-pay

## 2019-07-10 ENCOUNTER — Telehealth: Payer: Self-pay | Admitting: Cardiology

## 2019-07-10 DIAGNOSIS — Z79899 Other long term (current) drug therapy: Secondary | ICD-10-CM

## 2019-07-10 DIAGNOSIS — R002 Palpitations: Secondary | ICD-10-CM

## 2019-07-10 DIAGNOSIS — R7989 Other specified abnormal findings of blood chemistry: Secondary | ICD-10-CM

## 2019-07-10 NOTE — Telephone Encounter (Signed)
Stephanie from Dr. Pennie Banter office was hoping for the patient to have orders placed for a magnesium draw done when she comes for labs at our office. The patient was at their office for a post hospital f/u and would not let Dr. Shelia Media draw labs at his office. She only wanted to get stuck once. If Dr. Percival Spanish could put in the orders and send the results to Dr. Shelia Media then they would appreciate it.,  The number provided is a direct number to [facility] at Dr. Pennie Banter office. Please let Colletta Maryland know if the request can be fulfilled

## 2019-07-10 NOTE — Telephone Encounter (Signed)
Patient contacted regarding discharge from [facility] on 07/05/19.  Patient understands to follow up with provider Coletta Memos PA on 07/14/19 at 9:15 am at [facility] office. Patient understands discharge instructions? yes Patient understands medications and regiment? yes Patient understands to bring all medications to this visit? yes  Ask patient:  Are you enrolled in My Chart (yes or no)  If no ask patient if they would like to enroll.   Pt to have post hosp labs 07/11/19.

## 2019-07-10 NOTE — Progress Notes (Signed)
Subjective: Karen West presents today for follow up of preventative diabetic foot care and painful mycotic nails b/l that are difficult to trim. Pain interferes with ambulation. Aggravating factors include wearing enclosed shoe gear. Pain is relieved with periodic professional debridement.   She states she was hospitalized on April 14th. She went to her PCP's office on that day and began to feel ill. Her PCP evaluated her and call an ambulance. She was admitted and diagnosed with acute myocardial infarction. She states she is feeling better after hospitalization/cardiac intervention.  She voices no new pedal concerns on today's visit.  Allergies  Allergen Reactions  . Tramadol Nausea And Vomiting and Other (See Comments)    "got sick to my stomach and was throwing up blood; it put me in the hospital")  . Lisinopril Rash and Other (See Comments)    Renal failure   . Tapazole [Methimazole] Itching and Rash     Objective: Vitals:   07/08/19 0914  Temp: (!) 96.1 F (35.6 C)    Pt is a pleasant 79 y.o. year old AA female in NAD. AAO x 3.   Vascular Examination:  Capillary fill time to digits <3 seconds b/l. Faintly palpable DP pulses b/l. Faintly palpable PT pulses b/l. Pedal hair present b/l. Skin temperature gradient within normal limits b/l.  Dermatological Examination: Pedal skin with normal turgor, texture and tone bilaterally. No open wounds bilaterally. No interdigital macerations bilaterally. Toenails 1-5 b/l elongated, dystrophic, thickened, crumbly with subungual debris and tenderness to dorsal palpation.  Musculoskeletal: Normal muscle strength 5/5 to all lower extremity muscle groups bilaterally, no pain crepitus or joint limitation noted with ROM b/l, bunion deformity noted b/l and hammertoes noted to the  2-5 bilaterally.  Neurological: Protective sensation intact 5/5 intact bilaterally with 10g monofilament b/l. Vibratory sensation intact b/l.  Assessment: 1.  Pain due to onychomycosis of toenails of both feet    Plan: -Continue diabetic foot care principles. Literature dispensed on today.  -Toenails 1-5 b/l were debrided in length and girth with sterile nail nippers and dremel without iatrogenic bleeding.  -Patient to continue soft, supportive shoe gear daily. -Patient to report any pedal injuries to medical professional immediately. -Patient/POA to call should there be question/concern in the interim.  Return in about 3 months (around 10/07/2019) for diabetic nail trim.

## 2019-07-10 NOTE — Telephone Encounter (Signed)
Dr. Percival Spanish- are you ok with ordering a Magnesium to be sent to Dr. Shelia Media once resulted?

## 2019-07-10 NOTE — Telephone Encounter (Signed)
Yes,   Thanks.  Karen West

## 2019-07-10 NOTE — Telephone Encounter (Signed)
Magnesium lab ordered per Dr. Rosezella Florida okay. Stephanie with Dr. Lambert Mody office updated.

## 2019-07-11 DIAGNOSIS — R002 Palpitations: Secondary | ICD-10-CM | POA: Diagnosis not present

## 2019-07-11 DIAGNOSIS — Z79899 Other long term (current) drug therapy: Secondary | ICD-10-CM | POA: Diagnosis not present

## 2019-07-11 LAB — BASIC METABOLIC PANEL
BUN/Creatinine Ratio: 14 (ref 12–28)
BUN: 22 mg/dL (ref 8–27)
CO2: 18 mmol/L — ABNORMAL LOW (ref 20–29)
Calcium: 9.1 mg/dL (ref 8.7–10.3)
Chloride: 102 mmol/L (ref 96–106)
Creatinine, Ser: 1.53 mg/dL — ABNORMAL HIGH (ref 0.57–1.00)
GFR calc Af Amer: 38 mL/min/{1.73_m2} — ABNORMAL LOW (ref >59)
GFR calc non Af Amer: 33 mL/min/{1.73_m2} — ABNORMAL LOW (ref >59)
Glucose: 124 mg/dL — ABNORMAL HIGH (ref 65–99)
Potassium: 5.2 mmol/L (ref 3.5–5.2)
Sodium: 137 mmol/L (ref 134–144)

## 2019-07-11 LAB — MAGNESIUM: Magnesium: 2.3 mg/dL (ref 1.6–2.3)

## 2019-07-13 NOTE — Progress Notes (Signed)
Cardiology Clinic Note   Patient Name: Karen West Date of Encounter: 07/14/2019  Primary Care Provider:  Deland Pretty, MD Primary Cardiologist:  Karen Breeding, MD  Patient Profile    Karen West 79 year old female presents today for follow-up of her NSTEMI on 07/03/2019.  Past Medical History    Past Medical History:  Diagnosis Date  . Atherosclerosis of aorta ([place])    a. 01/2017/03/2017 - noted on high res chest CTs.  . Breast cancer ([place])    a. Bilateral --> s/p left mastectomy  . Carotid artery disease ([place])    a. 10/9379 w/ 01-75% LICA stenosis and <10% RICA stenosis; b. 10/2015 Carotid U/S: < 50% BICA stenosis  . Chest pain    a. 09/2011 MV: EF 68%, no ischemia/infarct.  . Chronic anemia   . Chronic headaches    denies  . Coronary artery calcification seen on CT scan    a. 01/2017 High res CT: atherosclerotic calcification of the arterial vascularture, including severe involvement of the coronary arteries; b. 03/2017 CT Chest: coronary and Ao atheroscelrosis.  Marland Kitchen GERD (gastroesophageal reflux disease)   . History of echocardiogram    a. 09/2011 Echo: EF 55-60%, no rwma, triv AI, PASP 90mmHg.  Marland Kitchen Hyperlipidemia   . Hypertension   . Hyperthyroidism   . Left upper lobe pulmonary nodule    a. 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 05/2017 s/p VATS.  . Multinodular goiter    a. 02/2017 PET scan- Hypermetabolic nodule;  b. 09/7822 s/p thyroidectomy  . Obesity   . OSA on CPAP    cpap  . Osteoarthritis   . Personal history of radiation therapy 1999  . Right bundle branch block   . Type II or unspecified type diabetes mellitus without mention of complication, uncontrolled    Past Surgical History:  Procedure Laterality Date  . BREAST BIOPSY  1993; 1995; 2000   left; right; left  . BREAST EXCISIONAL BIOPSY Right pt unsure  . CORONARY STENT INTERVENTION N/A 07/03/2019   Procedure: CORONARY STENT INTERVENTION;  Surgeon: Troy Sine, MD;  Location: [facility];  Service: Cardiovascular;  Laterality: N/A;  RCA  . LEFT HEART CATH AND CORONARY ANGIOGRAPHY N/A 07/03/2019   Procedure: LEFT HEART CATH AND CORONARY ANGIOGRAPHY;  Surgeon: Troy Sine, MD;  Location: [facility];  Service: Cardiovascular;  Laterality: N/A;  . MASTECTOMY Left 2000   left  . THYROIDECTOMY  05/10/2017   VIDEO BRONCHOSCOPY WITH ENDOBRONCHIAL NAVIGATION (N/A)  . THYROIDECTOMY N/A 05/10/2017   Procedure: TOTAL THYROIDECTOMY;  Surgeon: Armandina Gemma, MD;  Location: [facility];  Service: General;  Laterality: N/A;  . VIDEO ASSISTED THORACOSCOPY (VATS)/ LOBECTOMY Left 06/04/2017   Procedure: VIDEO ASSISTED THORACOSCOPY (VATS)/LEFT UPPER LOBECTOMY;  Surgeon: [name], MD;  Location: [facility];  Service: Thoracic;  Laterality: Left;  Marland Kitchen VIDEO BRONCHOSCOPY WITH ENDOBRONCHIAL NAVIGATION N/A 05/10/2017   Procedure: VIDEO BRONCHOSCOPY WITH ENDOBRONCHIAL NAVIGATION;  Surgeon: [name], MD;  Location: [facility];  Service: Thoracic;  Laterality: N/A;  . VIDEO BRONCHOSCOPY WITH ENDOBRONCHIAL ULTRASOUND N/A 06/04/2017   Procedure: VIDEO BRONCHOSCOPY WITH ENDOBRONCHIAL ULTRASOUND;  Surgeon: [name], MD;  Location: [facility];  Service: Thoracic;  Laterality: N/A;    Allergies  Allergies  Allergen Reactions  . Tramadol Nausea And Vomiting and Other (See Comments)    "got sick to my stomach and was throwing up blood; it put  me in the hospital")  . Lisinopril Rash and Other (See Comments)    Renal failure   . Tapazole [Methimazole] Itching and Rash    History of Present Illness    Ms. 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  She presented to her PCP for evaluation of 2 weeks of intermittent chest pain.  Her CP was worse with exertion and had associated shortness of breath.  During her PCP appointment she had a recurrent episode of chest pain and was sent to the emergency department.  In the emergency department she was given aspirin and found to have HS troponin of 26 with repeat of 52.  Her CXR showed no acute cardiopulmonary disease with mitral annulus calcification as well as aortic atherosclerosis.  EKG showed new complete RBBB with more pronounced DWI in leads V1, V3, and III.  She is also noted to have dependent lower extremity edema.  She was started on IV heparin at that time and admitted.  She was taken to the Cath Lab on 07/03/2019 and was found to have a proximal-mid LAD lesion 70%, proximal LAD lesion 40%, mid RCA to distal RCA lesion 80%, DES successfully placed, distal RCA 95% DES was successfully placed.  Femoral approach was used.  Total DES x4 to RCA.  He presents the clinic today for follow-up evaluation with her sister and states she is feeling better than she did prior to her cardiac catheterization.  She has been walking for 5 minutes every other day.  She is ready to do more around her house and start driving.  She was interested in further prevention/risk reduction.  We talked about the importance of medication compliance, diet, exercise, and keeping follow-up appointments.  She states that her blood pressure is slightly elevated at home running between 140s and 130s.  I will increase her metoprolol to 50 mg twice daily, repeat a BMP in 1 week due to her AKI, and have her follow-up in 1 month for reevaluation.  Today she denies chest pain, shortness of breath, lower extremity edema, fatigue, palpitations, melena, hematuria, hemoptysis, diaphoresis, weakness, presyncope, syncope, orthopnea, and  PND.   Home Medications    Prior to Admission medications   Medication Sig Start Date End Date Taking? Authorizing Provider  acetaminophen (TYLENOL) 500 MG tablet Take 1,000 mg by mouth every 6 (six) hours as needed for moderate pain or headache.    [provider]  amLODipine (NORVASC) 10 MG tablet Take 10 mg by mouth daily.    [provider]  aspirin 81 MG tablet Take 81 mg by mouth daily.      [provider]  Cobalamin Combinations (B-12) 6126939413 MCG SUBL Place 1 tablet under the tongue daily.     [provider]  empagliflozin (JARDIANCE) 25 MG TABS tablet Take 25 mg by mouth daily.    [provider]  ezetimibe (ZETIA) 10 MG tablet Take 10 mg by mouth every evening.     [provider]  ferrous sulfate 325 (65 FE) MG tablet Take 325 mg by mouth 2 (two) times daily. 11/04/18   [provider]  hydrocortisone cream 1 % Apply 1 application topically 2 (two) times daily as needed for itching.  [provider]  levothyroxine (SYNTHROID, LEVOTHROID) 88 MCG tablet Take 88 mcg by mouth daily. 07/02/17   [provider]  linagliptin (TRADJENTA) 5 MG TABS tablet Take 5 mg by mouth daily.    [provider]  Magnesium Oxide 400 (240 Mg) MG TABS Take 400 mg by mouth 2 (two) times daily.     [provider]  metFORMIN (GLUCOPHAGE) 1000 MG tablet Take 1,000 mg by mouth 2 (two) times daily.     [provider]  metoprolol tartrate (LOPRESSOR) 25 MG tablet Take 1 tablet (25 mg total) by mouth 2 (two) times daily. 07/05/19   Furth, Cadence H, PA-C  NONFORMULARY OR COMPOUNDED ITEM [facility]:  Achilles Tendonitis #12 - Diclofenac 3%, Baclofen 2%, Bupivacaine 1%, Gabapentin 6%, Pentoxifylline 3%, Topiramate 1%. Apply 1-2 grams to affected area 3-4 times daily. 05/28/18   Wallene Huh, DPM  Omega-3 Fatty Acids (FISH OIL) 1200 MG CAPS Take 1,200 mg by mouth 2 (two) times daily.     [provider]  omeprazole (PRILOSEC) 20 MG capsule Take 20 mg by mouth 2 (two) times daily. Takes prn    [provider]  rosuvastatin (CRESTOR) 20 MG tablet Take 20 mg by mouth every evening.     [provider]  ticagrelor (BRILINTA) 90 MG TABS tablet Take 1 tablet (90 mg total) by mouth 2 (two) times daily. 07/05/19   Furth, Cadence H, PA-C    Family History    Family History  Problem Relation Age of Onset  . Diabetes Brother        x3  . Hypertension Brother        x3  . Diabetes Brother   . Stroke Brother   . Heart attack Mother 58       Mother Died of MI age 77  . Diabetes Sister   . Stroke Sister    She indicated that her mother is deceased. She indicated that her father is deceased. She indicated that two of her four brothers are alive.  Social History    Social History   Socioeconomic History  . Marital status: Widowed    Spouse name: Not on file  . Number of children: Not on file  . Years of education: Not on file  . Highest education level: Not on file  Occupational History  . Occupation: retired    Fish farm manager: RETIRED    Comment: accountant  Tobacco Use  . Smoking status: Never Smoker  . Smokeless tobacco: Never Used  Substance and Sexual Activity  . Alcohol use: Yes    Comment: 10/04/11 "drink a wine or beeer 3-4 times/year"  . Drug use: No  . Sexual activity: Yes    Partners: Male    Birth control/protection: None  Other Topics Concern  . Not on file  Social History Narrative   Lives alone.  Four children.    Social Determinants of Health   Financial Resource Strain:   . Difficulty of Paying Living Expenses:   Food Insecurity:   . Worried About Charity fundraiser in the Last Year:   . Arboriculturist in the Last Year:   Transportation Needs:   . Film/video editor (Medical):   Marland Kitchen Lack of Transportation (Non-Medical):   Physical Activity:   . Days of Exercise per Week:   . Minutes of Exercise per Session:   Stress:   .  Feeling of Stress :   Social Connections:   .  Frequency of Communication with Friends and Family:   . Frequency of Social Gatherings with Friends and Family:   . Attends Religious Services:   . Active Member of Clubs or Organizations:   . Attends Archivist Meetings:   Marland Kitchen Marital Status:   Intimate Partner Violence:   . Fear of Current or Ex-Partner:   . Emotionally Abused:   Marland Kitchen Physically Abused:   . Sexually Abused:      Review of Systems    General:  No chills, fever, night sweats or weight changes.  Cardiovascular:  No chest pain, dyspnea on exertion, edema, orthopnea, palpitations, paroxysmal nocturnal dyspnea. Dermatological: No rash, lesions/masses Respiratory: No cough, dyspnea Urologic: No hematuria, dysuria Abdominal:   No nausea, vomiting, diarrhea, bright red blood per rectum, melena, or hematemesis Neurologic:  No visual changes, wkns, changes in mental status. All other systems reviewed and are otherwise negative except as noted above.  Physical Exam    VS:  BP 138/72   Pulse 79   Ht 5\' 2"  (1.575 m)   Wt 148 lb 3.2 oz (67.2 kg)   SpO2 100%   BMI 27.11 kg/m  , BMI Body mass index is 27.11 kg/m. GEN: Well nourished, well developed, in no acute distress. HEENT: normal. Neck: Supple, no JVD, carotid bruits, or masses. Cardiac: RRR, no murmurs, rubs, or gallops. No clubbing, cyanosis, edema.  Radials/DP/PT 2+ and equal bilaterally.  Respiratory:  Respirations regular and unlabored, clear to auscultation bilaterally. GI: Soft, nontender, nondistended, BS + x 4. MS: no deformity or atrophy. Skin: warm and dry, no rash. Neuro:  Strength and sensation are intact. Psych: Normal affect.  Accessory Clinical Findings    ECG personally reviewed by me today-  normal sinus rhythm possible left atrial enlargement no ST or T wave deviation 79 bpm   Echocardiogram 06/23/2019 1. Left ventricular ejection fraction, by estimation, is 65 to 70%. The  left ventricle  has normal function. The left ventricle has no regional  wall motion abnormalities. Left ventricular diastolic parameters are  consistent with Grade I diastolic  dysfunction (impaired relaxation). Elevated left ventricular end-diastolic  pressure.  2. Right ventricular systolic function is normal. The right ventricular  size is normal.  3. The mitral valve is normal in structure. Moderate mitral valve  regurgitation. No evidence of mitral stenosis.  4. Tricuspid valve regurgitation is mild to moderate.  5. The aortic valve is tricuspid. Aortic valve regurgitation is moderate.  Mild aortic valve stenosis. Aortic valve mean gradient measures 7.0 mmHg.  6. The inferior vena cava is normal in size with greater than 50%  respiratory variability, suggesting right atrial pressure of 3 mmHg.  Cardiac catheterization 07/03/2019  Cardiac Catheterization 07/03/19  Prox LAD to Mid LAD lesion is 70% stenosed.  Prox LAD lesion is 40% stenosed.  Mid RCA to Dist RCA lesion is 80% stenosed.  A stent was successfully placed.  Dist RCA lesion is 95% stenosed.  Post intervention, there is a 0% residual stenosis.  Post intervention, there is a 0% residual stenosis.  There is severe mitral annular calcification.   The LAD has moderate diffuse calcification with 40 followed by 70% proximal stenosis prior to the takeoff of the first diagonal vessel with 20% mid stenosis.  Left circumflex vessel is tortuous and free of significant obstructive disease.  The RCA is a dominant vessel that had 80% diffuse stenosis in the region of the acute margin followed by 95% focal distal stenosis.  Transient 2-1/complete heart block  with insertion of catheter into the LV with spontaneous resolution.  PCI performed via the femoral approach after transitioning from the radial approach which was associated with significant radial artery spasm.  Very difficult PCI with very difficult catheter backup with  initial PTCA of the distal and acute margin stenoses with subsequent development of guiding catheter induced very proximal RCA dissection extending to the acute margin. Utilizing telescope guidliner support from distal to near ostial for Resolute Onyx DES stents were inserted with the most distal stent being a 2.5 x 18 mm stent followed by 2.5 x 22 mm stent, 2.5 x 38 mm stent and the most proximal stent a 2.75 x 18 mm Resolute Onyx stent with an excellent angiographic result with brisk TIMI-3 flow and no evidence for residual dissection.  RECOMMENDATION: Patient will be hydrated post procedure. Continue DAPT for minimum of 12 months. Initiate medical therapy for ischemic disease. May need future CSI atherectomy to the LAD lesion which is significantly calcified. Aggressive lipid-lowering therapy.  Coronary Diagrams  Diagnostic Dominance: Right  Intervention       Assessment & Plan   1.  NSTEMI-EKG today shows normal sinus rhythm possible left atrial enlargement no ST or T wave deviation 79 bpm.  Received DES x4 to his RCA.  LAD with 40% and 70% calcified stenosis.  DAPT aspirin and Brilinta for 12 months prescribed.  Echocardiogram showed an EF of 65-70%, no WMA, G1 DD, moderate MR, mild to moderate TR, moderate AR. Continue aspirin Continue Brilinta-assistance sheet given Continue statin Continue amlodipine Increase metoprolol 50 mg twice daily Continue cardiac rehab Heart healthy low-sodium diet-salty 6 given Increase physical activity as tolerated  Hypertension-BP today 138/72.  Has been between 130s and 383F at home systolic. Continue amlodipine Increase metoprolol to 50 mg twice daily Heart healthy low-sodium diet-salty 6 given Increase physical activity as tolerated  Hyperlipidemia-07/03/2019: Cholesterol 121; HDL 37; LDL Cholesterol 61; Triglycerides 113; VLDL 23 Continue ezetimibe Continue rosuvastatin Heart healthy low-sodium high-fiber diet Increase physical  activity as tolerated  Acute kidney injury-creatinine at discharge 1.36 07/05/19.  On follow-up creatinine 1.53 07/11/19 Increase p.o. hydration Repeat BMP in one week  Disposition: Follow-up with me in 1 month.  Jossie Ng. Nusaiba Guallpa NP-C    07/14/2019, 9:38 AM [address] Office [phone] Fax [phone]

## 2019-07-14 ENCOUNTER — Encounter: Payer: Self-pay | Admitting: General Practice

## 2019-07-14 ENCOUNTER — Other Ambulatory Visit: Payer: Self-pay

## 2019-07-14 ENCOUNTER — Telehealth: Payer: Self-pay | Admitting: General Practice

## 2019-07-14 ENCOUNTER — Ambulatory Visit (INDEPENDENT_AMBULATORY_CARE_PROVIDER_SITE_OTHER): Payer: Medicare Other | Admitting: General Practice

## 2019-07-14 VITALS — BP 138/72 | HR 79 | Ht 62.0 in | Wt 148.2 lb

## 2019-07-14 DIAGNOSIS — N179 Acute kidney failure, unspecified: Secondary | ICD-10-CM

## 2019-07-14 DIAGNOSIS — E7800 Pure hypercholesterolemia, unspecified: Secondary | ICD-10-CM | POA: Diagnosis not present

## 2019-07-14 DIAGNOSIS — I214 Non-ST elevation (NSTEMI) myocardial infarction: Secondary | ICD-10-CM

## 2019-07-14 DIAGNOSIS — I10 Essential (primary) hypertension: Secondary | ICD-10-CM | POA: Diagnosis not present

## 2019-07-14 DIAGNOSIS — Z79899 Other long term (current) drug therapy: Secondary | ICD-10-CM

## 2019-07-14 MED ORDER — METOPROLOL TARTRATE 50 MG PO TABS: 25.0000 mg | ORAL_TABLET | Freq: Two times a day (BID) | ORAL | 3 refills | Status: DC

## 2019-07-14 NOTE — Patient Instructions (Addendum)
Medication Instructions:  INCREASE METOPROLOL 50MG  DAILY *If you need a refill on your cardiac medications before your next appointment, please call your pharmacy*  Lab Work: BMET 1 WEEK (07-21-2019) If you have labs (blood work) drawn today and your tests are completely normal, you will receive your results only by:  [place] (if you have MyChart) OR A paper copy in the mail.  If you have any lab test that is abnormal or we need to change your treatment, we will call you to review the results. You may go to any [facility] that is convenient for you however, we do have a lab in our office that is able to assist you. You DO NOT need an appointment for our lab. The lab is open 8:00am and closes at 4:00pm. Lunch 12:45 - 1:45pm.  Special Instructions PLEASE INCREASE PHYSICAL ACTIVITY SLOWLY  PLEASE TAKE AND LOG YOUR BLOOD PRESSURE DAILY AND BRING WITH YOU TO FOLLOW UP APPT IN 1 MONTH  PLEASE READ AND FOLLOW SALTY 6-ATTACHED  Follow-Up: Your next appointment:  1 month(s)  In Person with [place], FNP-C  At [facility], you and your health needs are our priority.  As part of our continuing mission to provide you with exceptional heart care, we have created designated Provider Care Teams.  These Care Teams include your primary Cardiologist (physician) and Advanced Practice Providers (APPs -  Physician Assistants and Nurse Practitioners) who all work together to provide you with the care you need, when you need it.

## 2019-07-14 NOTE — Telephone Encounter (Signed)
Returned call to pt she states that she is just wanting to make sure JC wants her to still increase metoprolol. JC states to take 50mg  daily. Pt verbalizes understanding. She will start tomorrow.

## 2019-07-14 NOTE — Telephone Encounter (Signed)
Per chart review, as message was routed to covering nurse.

## 2019-07-14 NOTE — Telephone Encounter (Signed)
Error- did not need this encounter

## 2019-07-14 NOTE — Telephone Encounter (Signed)
New Message:     Please call, concerning her medicine.

## 2019-07-15 ENCOUNTER — Other Ambulatory Visit: Payer: Self-pay | Admitting: *Deleted

## 2019-07-15 NOTE — Patient Outreach (Signed)
[facility]) Care Management  07/15/2019  ZYLEE MARCHIANO 07-03-1940 [id number]   EMMI-GENERAL DISCHARGE RED ON EMMI ALERT Day #4 Date:  4/17 Red Alert Reason: Questions/concerns  Outreach #2  RN spoke with pt today and verified the above emmi has been resolved with no additional issues or concerns. Pt reports she has visited her provider and currently pending a visit with her CAD provider. No other needs at this time.  Plan: Will close this case.  Raina Mina, RN Care Management Coordinator [place] Office [phone]

## 2019-07-21 DIAGNOSIS — E7800 Pure hypercholesterolemia, unspecified: Secondary | ICD-10-CM | POA: Diagnosis not present

## 2019-07-21 DIAGNOSIS — I10 Essential (primary) hypertension: Secondary | ICD-10-CM | POA: Diagnosis not present

## 2019-07-21 DIAGNOSIS — N179 Acute kidney failure, unspecified: Secondary | ICD-10-CM | POA: Diagnosis not present

## 2019-07-21 DIAGNOSIS — Z79899 Other long term (current) drug therapy: Secondary | ICD-10-CM | POA: Diagnosis not present

## 2019-07-21 DIAGNOSIS — I214 Non-ST elevation (NSTEMI) myocardial infarction: Secondary | ICD-10-CM | POA: Diagnosis not present

## 2019-07-21 LAB — BASIC METABOLIC PANEL
BUN/Creatinine Ratio: 17 (ref 12–28)
BUN: 25 mg/dL (ref 8–27)
CO2: 20 mmol/L (ref 20–29)
Calcium: 9.3 mg/dL (ref 8.7–10.3)
Chloride: 103 mmol/L (ref 96–106)
Creatinine, Ser: 1.47 mg/dL — ABNORMAL HIGH (ref 0.57–1.00)
GFR calc Af Amer: 40 mL/min/{1.73_m2} — ABNORMAL LOW (ref >59)
GFR calc non Af Amer: 34 mL/min/{1.73_m2} — ABNORMAL LOW (ref >59)
Glucose: 106 mg/dL — ABNORMAL HIGH (ref 65–99)
Potassium: 4.2 mmol/L (ref 3.5–5.2)
Sodium: 138 mmol/L (ref 134–144)

## 2019-07-28 ENCOUNTER — Other Ambulatory Visit: Payer: Self-pay

## 2019-07-28 ENCOUNTER — Encounter (HOSPITAL_COMMUNITY): Payer: Self-pay

## 2019-07-28 ENCOUNTER — Ambulatory Visit (HOSPITAL_COMMUNITY)
Admission: RE | Admit: 2019-07-28 | Discharge: 2019-07-28 | Disposition: A | Discharge status: Home / Self Care | Payer: Medicare Other | Source: Ambulatory Visit | Attending: Internal Medicine | Admitting: Internal Medicine

## 2019-07-28 ENCOUNTER — Inpatient Hospital Stay: Payer: Medicare Other | Attending: Internal Medicine

## 2019-07-28 ENCOUNTER — Other Ambulatory Visit: Payer: Medicare Other

## 2019-07-28 DIAGNOSIS — I083 Combined rheumatic disorders of mitral, aortic and tricuspid valves: Secondary | ICD-10-CM | POA: Diagnosis not present

## 2019-07-28 DIAGNOSIS — Z923 Personal history of irradiation: Secondary | ICD-10-CM | POA: Insufficient documentation

## 2019-07-28 DIAGNOSIS — I2542 Coronary artery dissection: Secondary | ICD-10-CM | POA: Diagnosis not present

## 2019-07-28 DIAGNOSIS — Z902 Acquired absence of lung [part of]: Secondary | ICD-10-CM | POA: Insufficient documentation

## 2019-07-28 DIAGNOSIS — I700 Atherosclerosis of aorta: Secondary | ICD-10-CM | POA: Diagnosis not present

## 2019-07-28 DIAGNOSIS — C3412 Malignant neoplasm of upper lobe, left bronchus or lung: Secondary | ICD-10-CM | POA: Insufficient documentation

## 2019-07-28 DIAGNOSIS — C3490 Malignant neoplasm of unspecified part of unspecified bronchus or lung: Secondary | ICD-10-CM | POA: Diagnosis not present

## 2019-07-28 DIAGNOSIS — I2510 Atherosclerotic heart disease of native coronary artery without angina pectoris: Secondary | ICD-10-CM | POA: Insufficient documentation

## 2019-07-28 DIAGNOSIS — R079 Chest pain, unspecified: Secondary | ICD-10-CM | POA: Insufficient documentation

## 2019-07-28 DIAGNOSIS — N2889 Other specified disorders of kidney and ureter: Secondary | ICD-10-CM | POA: Insufficient documentation

## 2019-07-28 DIAGNOSIS — Z885 Allergy status to narcotic agent status: Secondary | ICD-10-CM | POA: Diagnosis not present

## 2019-07-28 DIAGNOSIS — Z9012 Acquired absence of left breast and nipple: Secondary | ICD-10-CM | POA: Diagnosis not present

## 2019-07-28 DIAGNOSIS — Z79899 Other long term (current) drug therapy: Secondary | ICD-10-CM | POA: Insufficient documentation

## 2019-07-28 DIAGNOSIS — I352 Nonrheumatic aortic (valve) stenosis with insufficiency: Secondary | ICD-10-CM | POA: Insufficient documentation

## 2019-07-28 DIAGNOSIS — I10 Essential (primary) hypertension: Secondary | ICD-10-CM | POA: Insufficient documentation

## 2019-07-28 DIAGNOSIS — I442 Atrioventricular block, complete: Secondary | ICD-10-CM | POA: Diagnosis not present

## 2019-07-28 DIAGNOSIS — Z888 Allergy status to other drugs, medicaments and biological substances status: Secondary | ICD-10-CM | POA: Diagnosis not present

## 2019-07-28 LAB — CBC WITH DIFFERENTIAL (CANCER CENTER ONLY)
Abs Immature Granulocytes: 0.02 10*3/uL (ref 0.00–0.07)
Basophils Absolute: 0 10*3/uL (ref 0.0–0.1)
Basophils Relative: 0 %
Eosinophils Absolute: 0.2 10*3/uL (ref 0.0–0.5)
Eosinophils Relative: 2 %
HCT: 29.9 % — ABNORMAL LOW (ref 36.0–46.0)
Hemoglobin: 9.5 g/dL — ABNORMAL LOW (ref 12.0–15.0)
Immature Granulocytes: 0 %
Lymphocytes Relative: 21 %
Lymphs Abs: 1.5 10*3/uL (ref 0.7–4.0)
MCH: 27 pg (ref 26.0–34.0)
MCHC: 31.8 g/dL (ref 30.0–36.0)
MCV: 84.9 fL (ref 80.0–100.0)
Monocytes Absolute: 0.6 10*3/uL (ref 0.1–1.0)
Monocytes Relative: 9 %
Neutro Abs: 4.7 10*3/uL (ref 1.7–7.7)
Neutrophils Relative %: 68 %
Platelet Count: 275 10*3/uL (ref 150–400)
RBC: 3.52 MIL/uL — ABNORMAL LOW (ref 3.87–5.11)
RDW: 14.6 % (ref 11.5–15.5)
WBC Count: 7 10*3/uL (ref 4.0–10.5)
nRBC: 0 % (ref 0.0–0.2)

## 2019-07-28 LAB — CMP (CANCER CENTER ONLY)
ALT: 101 U/L — ABNORMAL HIGH (ref 0–44)
AST: 83 U/L — ABNORMAL HIGH (ref 15–41)
Albumin: 3.7 g/dL (ref 3.5–5.0)
Alkaline Phosphatase: 85 U/L (ref 38–126)
Anion gap: 14 (ref 5–15)
BUN: 22 mg/dL (ref 8–23)
CO2: 21 mmol/L — ABNORMAL LOW (ref 22–32)
Calcium: 8.9 mg/dL (ref 8.9–10.3)
Chloride: 107 mmol/L (ref 98–111)
Creatinine: 1.58 mg/dL — ABNORMAL HIGH (ref 0.44–1.00)
GFR, Est AFR Am: 36 mL/min — ABNORMAL LOW (ref >60)
GFR, Estimated: 31 mL/min — ABNORMAL LOW (ref >60)
Glucose, Bld: 116 mg/dL — ABNORMAL HIGH (ref 70–99)
Potassium: 3.9 mmol/L (ref 3.5–5.1)
Sodium: 142 mmol/L (ref 135–145)
Total Bilirubin: 0.4 mg/dL (ref 0.3–1.2)
Total Protein: 7.9 g/dL (ref 6.5–8.1)

## 2019-07-30 ENCOUNTER — Inpatient Hospital Stay (HOSPITAL_BASED_OUTPATIENT_CLINIC_OR_DEPARTMENT_OTHER): Payer: Medicare Other | Admitting: Internal Medicine

## 2019-07-30 ENCOUNTER — Telehealth: Payer: Self-pay | Admitting: Internal Medicine

## 2019-07-30 ENCOUNTER — Other Ambulatory Visit: Payer: Self-pay

## 2019-07-30 ENCOUNTER — Telehealth: Payer: Self-pay | Admitting: Medical Oncology

## 2019-07-30 ENCOUNTER — Encounter: Payer: Self-pay | Admitting: Internal Medicine

## 2019-07-30 VITALS — BP 137/52 | HR 72 | Temp 97.8°F | Resp 19 | Ht 62.0 in | Wt 144.0 lb

## 2019-07-30 DIAGNOSIS — R079 Chest pain, unspecified: Secondary | ICD-10-CM | POA: Diagnosis not present

## 2019-07-30 DIAGNOSIS — I442 Atrioventricular block, complete: Secondary | ICD-10-CM | POA: Diagnosis not present

## 2019-07-30 DIAGNOSIS — C3492 Malignant neoplasm of unspecified part of left bronchus or lung: Secondary | ICD-10-CM | POA: Diagnosis not present

## 2019-07-30 DIAGNOSIS — C3490 Malignant neoplasm of unspecified part of unspecified bronchus or lung: Secondary | ICD-10-CM

## 2019-07-30 DIAGNOSIS — C3412 Malignant neoplasm of upper lobe, left bronchus or lung: Secondary | ICD-10-CM | POA: Diagnosis not present

## 2019-07-30 DIAGNOSIS — I10 Essential (primary) hypertension: Secondary | ICD-10-CM | POA: Diagnosis not present

## 2019-07-30 DIAGNOSIS — I2510 Atherosclerotic heart disease of native coronary artery without angina pectoris: Secondary | ICD-10-CM | POA: Diagnosis not present

## 2019-07-30 DIAGNOSIS — I700 Atherosclerosis of aorta: Secondary | ICD-10-CM | POA: Diagnosis not present

## 2019-07-30 NOTE — Telephone Encounter (Signed)
June 15th at 1145 Urology appt with Dr Gloriann Loan  for left kidney lesion. Records faxed.

## 2019-07-30 NOTE — Telephone Encounter (Signed)
Scheduled per 5/12 los. Prints AVS and calender for pt.

## 2019-07-30 NOTE — Telephone Encounter (Signed)
I called [facility] D/W Larkin Ina, she said that all they needed was [employer]. I faxed it with Jesse's NPI AND DEA on the cover sheet. just in case they needed it.  I have these copies in my PA file

## 2019-07-30 NOTE — Progress Notes (Signed)
[place] Telephone:([phone]) [phone]   Fax:(336) [place], [place] [address] [place] [address]  DIAGNOSIS:  stage IA (T1 a, N0, M0) non-small cell lung cancer, adenocarcinoma presented with left upper lobe lung nodule.  PRIOR THERAPY: status post left upper lobectomy with lymph node dissection under the care of Dr. Roxan Hockey on June 04, 2017.  CURRENT THERAPY: Observation.  INTERVAL HISTORY: Karen West 79 y.o. female returns to the clinic today for follow-up visit.  The patient is feeling fine today with no concerning complaints.  She was recently diagnosed with coronary artery disease and she had a stent placed.  She is currently on metoprolol 50 mg p.o. twice daily.  The patient denied having any current chest pain, shortness of breath, cough or hemoptysis.  She denied having any fever or chills.  She has no nausea, vomiting, diarrhea or constipation.  She has no headache or visual changes.  She had repeat CT scan of the chest performed recently and she is here for evaluation and discussion of her risk her results.  MEDICAL HISTORY: Past Medical History:  Diagnosis Date  . Atherosclerosis of aorta ([place])    a. 01/2017/03/2017 - noted on high res chest CTs.  . Breast cancer ([place])    a. Bilateral --> s/p left mastectomy  . Carotid artery disease ([place])    a. 10/3149 w/ 76-16% LICA stenosis and <07% RICA stenosis; b. 10/2015 Carotid U/S: < 50% BICA stenosis  . Chest pain    a. 09/2011 MV: EF 68%, no ischemia/infarct.  . Chronic anemia   . Chronic headaches    denies  . Coronary artery calcification seen on CT scan    a. 01/2017 High res CT: atherosclerotic calcification of the arterial vascularture, including severe involvement of the coronary arteries; b. 03/2017 CT Chest: coronary and Ao atheroscelrosis.  Marland Kitchen GERD (gastroesophageal reflux disease)   . History of echocardiogram    a. 09/2011 Echo: EF  55-60%, no rwma, triv AI, PASP 80mmHg.  Marland Kitchen Hyperlipidemia   . Hypertension   . Hyperthyroidism   . Left upper lobe pulmonary nodule    a. 02/2017 PET: slowing enlarging 1.7cm LUL nodule w/ low-grade metabolic activity; b. 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 05/2017 s/p VATS.  . Multinodular goiter    a. 02/2017 PET scan- Hypermetabolic nodule;  b. 04/6946 s/p thyroidectomy  . Obesity   . OSA on CPAP    cpap  . Osteoarthritis   . Personal history of radiation therapy 1999  . Right bundle branch block   . 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  MEDICATIONS:  Current Outpatient Medications  Medication Sig Dispense Refill  . acetaminophen (TYLENOL) 500 MG tablet Take 1,000 mg by mouth every 6 (six) hours as needed for moderate pain or headache.    Marland Kitchen amLODipine (NORVASC) 10 MG tablet Take 10 mg by mouth daily.    Marland Kitchen aspirin 81 MG tablet Take 81 mg by mouth daily.      . Cobalamin Combinations (B-12) [phone] MCG SUBL Place 1 tablet under the tongue daily.     . empagliflozin (JARDIANCE) 25 MG TABS tablet Take 25 mg by mouth daily.    Marland Kitchen ezetimibe (ZETIA) 10 MG tablet Take 10 mg by mouth every evening.     . ferrous sulfate 325 (65  FE) MG tablet Take 325 mg by mouth 2 (two) times daily.    . hydrocortisone cream 1 % Apply 1 application topically 2 (two) times daily as needed for itching.     . levothyroxine (SYNTHROID, LEVOTHROID) 88 MCG tablet Take 88 mcg by mouth daily.  5  . linagliptin (TRADJENTA) 5 MG TABS tablet Take 5 mg by mouth daily.    . Magnesium Oxide 400 (240 Mg) MG TABS Take 400 mg by mouth 2 (two) times daily.     . metFORMIN (GLUCOPHAGE) 1000 MG tablet Take 1,000 mg by mouth 2 (two) times daily.     . metoprolol tartrate (LOPRESSOR) 50 MG tablet Take 0.5 tablets (25 mg total) by mouth 2 (two) times daily. 60 tablet 3  . NONFORMULARY OR  COMPOUNDED ITEM [facility]:  Achilles Tendonitis #12 - Diclofenac 3%, Baclofen 2%, Bupivacaine 1%, Gabapentin 6%, Pentoxifylline 3%, Topiramate 1%. Apply 1-2 grams to affected area 3-4 times daily. 100 each 5  . Omega-3 Fatty Acids (FISH OIL) 1200 MG CAPS Take 1,200 mg by mouth 2 (two) times daily.     Marland Kitchen omeprazole (PRILOSEC) 20 MG capsule Take 20 mg by mouth 2 (two) times daily. Takes prn    . rosuvastatin (CRESTOR) 20 MG tablet Take 20 mg by mouth every evening.     . ticagrelor (BRILINTA) 90 MG TABS tablet Take 1 tablet (90 mg total) by mouth 2 (two) times daily. 180 tablet 3   No current facility-administered medications for this visit.    SURGICAL HISTORY:  Past Surgical History:  Procedure Laterality Date  . BREAST BIOPSY  1993; 1995; 2000   left; right; left  . BREAST EXCISIONAL BIOPSY Right pt unsure  . CORONARY STENT INTERVENTION N/A 07/03/2019   Procedure: CORONARY STENT INTERVENTION;  Surgeon: Troy Sine, MD;  Location: [facility];  Service: Cardiovascular;  Laterality: N/A;  RCA  . LEFT HEART CATH AND CORONARY ANGIOGRAPHY N/A 07/03/2019   Procedure: LEFT HEART CATH AND CORONARY ANGIOGRAPHY;  Surgeon: Troy Sine, MD;  Location: [facility];  Service: Cardiovascular;  Laterality: N/A;  . MASTECTOMY Left 2000   left  . THYROIDECTOMY  05/10/2017   VIDEO BRONCHOSCOPY WITH ENDOBRONCHIAL NAVIGATION (N/A)  . THYROIDECTOMY N/A 05/10/2017   Procedure: TOTAL THYROIDECTOMY;  Surgeon: Armandina Gemma, MD;  Location: [facility];  Service: General;  Laterality: N/A;  . VIDEO ASSISTED THORACOSCOPY (VATS)/ LOBECTOMY Left 06/04/2017   Procedure: VIDEO ASSISTED THORACOSCOPY (VATS)/LEFT UPPER LOBECTOMY;  Surgeon: [name], MD;  Location: [facility];  Service: Thoracic;  Laterality: Left;  Marland Kitchen VIDEO BRONCHOSCOPY WITH ENDOBRONCHIAL NAVIGATION N/A 05/10/2017   Procedure: VIDEO BRONCHOSCOPY WITH ENDOBRONCHIAL NAVIGATION;  Surgeon: [name], MD;  Location: [facility];  Service: Thoracic;  Laterality: N/A;  . VIDEO BRONCHOSCOPY WITH ENDOBRONCHIAL ULTRASOUND N/A 06/04/2017   Procedure: VIDEO BRONCHOSCOPY WITH ENDOBRONCHIAL ULTRASOUND;  Surgeon: [name], MD;  Location: [facility];  Service: Thoracic;  Laterality: N/A;    REVIEW OF SYSTEMS:  A comprehensive review of systems was negative.   PHYSICAL EXAMINATION: General appearance: alert, cooperative and no distress Head: Normocephalic, without obvious abnormality, atraumatic Neck: no adenopathy, no JVD, supple, symmetrical, trachea midline and thyroid not enlarged, symmetric, no tenderness/mass/nodules Lymph nodes: Cervical, supraclavicular, and axillary nodes normal. Resp: clear to auscultation bilaterally Back: symmetric, no curvature. ROM normal. No CVA tenderness. Cardio: regular rate and rhythm, S1, S2 normal, no murmur, click, rub or gallop GI: soft, non-tender; bowel sounds normal; no  masses,  no organomegaly Extremities: extremities normal, atraumatic, no cyanosis or edema  ECOG PERFORMANCE STATUS: 1 - Symptomatic but completely ambulatory  Blood pressure (!) 137/52, pulse 72, temperature 97.8 F (36.6 C), temperature source Temporal, resp. rate 19, height 5\' 2"  (1.575 m), weight 144 lb (65.3 kg), SpO2 100 %.  LABORATORY DATA: Lab Results  Component Value Date   WBC 7.0 07/28/2019   HGB 9.5 (L) 07/28/2019   HCT 29.9 (L) 07/28/2019   MCV 84.9 07/28/2019   PLT 275 07/28/2019      Chemistry      Component Value Date/Time   NA 142 07/28/2019 0850   NA 138 07/21/2019 1033   K 3.9 07/28/2019 0850   CL 107 07/28/2019 0850   CO2 21 (L) 07/28/2019 0850   BUN 22 07/28/2019 0850   BUN 25 07/21/2019 1033   CREATININE 1.58 (H) 07/28/2019 0850   CREATININE 1.16 (H) 09/11/2017 1241      Component Value Date/Time   CALCIUM 8.9 07/28/2019 0850   ALKPHOS 85 07/28/2019 0850   AST 83 (H) 07/28/2019 0850   ALT 101 (H) 07/28/2019 0850   BILITOT 0.4 07/28/2019 0850        RADIOGRAPHIC STUDIES: DG Chest 2 View  Result Date: 07/02/2019 CLINICAL DATA:  Chest pain EXAM: CHEST - 2 VIEW COMPARISON:  October 30, 2017 FINDINGS: Postoperative changes noted on the left with scarring and volume loss on the left, stable. No edema or airspace opacity. Heart size and pulmonary vascularity are stable and within normal limits for postoperative change on the left. There is mitral annulus calcification as well as aortic atherosclerosis. No adenopathy evident. Surgical clips are noted in the right axillary region. No appreciable bone lesions. Surgical clips are noted in the thyroid region. IMPRESSION: Postoperative changes on left with scarring and volume loss, stable. No edema or airspace opacity. Stable cardiac silhouette. Postoperative changes noted as well in the thyroid and right axillary regions. No adenopathy. Aortic Atherosclerosis (ICD10-I70.0). Electronically Signed   By: Lowella Grip III M.D.   On: 07/02/2019 11:13   CT Chest Wo Contrast  Result Date: 07/28/2019 CLINICAL DATA:  Restaging non-small cell lung cancer diagnosed in 2019. Remote history of breast cancer. EXAM: CT CHEST WITHOUT CONTRAST TECHNIQUE: Multidetector CT imaging of the chest was performed following the standard protocol without IV contrast. COMPARISON:  Chest CT 01/28/2019 and 07/26/2018. FINDINGS: Cardiovascular: Dense 3 vessel coronary artery atherosclerosis with lesser involvement of the aorta and great vessels. There are calcifications of the aortic valve and prominent calcifications of the mitral annulus. The heart size is normal. There is no pericardial effusion. Mediastinum/Nodes: There are no enlarged mediastinal, hilar, axillary or internal mammary lymph nodes. Small mediastinal lymph nodes are stable. There are stable postsurgical changes in both axilla.Probable previous thyroidectomy. The trachea and esophagus demonstrate no significant findings. Lungs/Pleura: Stable postsurgical changes from  left upper lobectomy. There is a stable calcified granuloma along the superior aspect of the right major fissure. No suspicious pulmonary nodularity. No pleural effusion or pneumothorax. Upper abdomen: No suspicious findings are seen within the visualized upper abdomen. There is lobularity of the kidneys with a hyperdense lesion posteriorly in the upper pole of the left kidney, measuring up to 3.2 cm on image 127/2. This was seen on PET-CT 02/28/2017 and was not hypermetabolic. Musculoskeletal/Chest wall: There is no chest wall mass or suspicious osseous finding. Postsurgical changes in both breasts and axilla. IMPRESSION: 1. Stable chest CT status post left upper lobectomy. No evidence  of local recurrence or metastatic disease. 2. Coronary and Aortic Atherosclerosis (ICD10-I70.0). Electronically Signed   By: Richardean Sale M.D.   On: 07/28/2019 11:06   CARDIAC CATHETERIZATION  Result Date: 07/03/2019  Prox LAD to Mid LAD lesion is 70% stenosed.  Prox LAD lesion is 40% stenosed.  Mid RCA to Dist RCA lesion is 80% stenosed.  A stent was successfully placed.  Dist RCA lesion is 95% stenosed.  Post intervention, there is a 0% residual stenosis.  Post intervention, there is a 0% residual stenosis.  There is severe mitral annular calcification.  The LAD has moderate diffuse calcification with 40 followed by 70% proximal stenosis prior to the takeoff of the first diagonal vessel with 20% mid stenosis. Left circumflex vessel is tortuous and free of significant obstructive disease. The RCA is a dominant vessel that had 80% diffuse stenosis in the region of the acute margin followed by 95% focal distal stenosis. Transient 2-1/complete heart block with insertion of catheter into the LV with spontaneous resolution. PCI performed via the femoral approach after transitioning from the radial approach which was associated with significant radial artery spasm. Very difficult PCI with very difficult catheter backup with  initial PTCA of the distal and acute margin stenoses with subsequent development of guiding catheter induced very proximal RCA dissection extending to the acute margin.  Utilizing telescope guidliner support from distal to near ostial for Resolute Onyx DES stents were inserted with the most distal stent being a 2.5 x 18 mm stent followed by 2.5 x 22 mm stent, 2.5 x 38 mm stent and the most proximal stent a 2.75 x 18 mm Resolute Onyx stent with an excellent angiographic result with brisk TIMI-3 flow and no evidence for residual dissection. RECOMMENDATION: Patient will be hydrated post procedure.  Continue DAPT for minimum of 12 months.  Initiate medical therapy for ischemic disease.  May need future CSI atherectomy to the LAD lesion which is significantly calcified.  Aggressive lipid-lowering therapy.   ECHOCARDIOGRAM COMPLETE  Result Date: 07/03/2019    ECHOCARDIOGRAM REPORT   Patient Name:   SHREE ESPEY Date of Exam: 07/03/2019 Medical Rec #:  [id number]        Height:       62.0 in Accession #:    [id number]       Weight:       147.3 lb Date of Birth:  04/17/1940         BSA:          1.679 m Patient Age:    14 years         BP:           158/74 mmHg Patient Gender: F                HR:           77 bpm. Exam Location:  Inpatient Procedure: 2D Echo STAT ECHO Indications:    chest pain 786.50  History:        Patient has prior history of Echocardiogram examinations, most                 recent 06/09/2017.  Sonographer:    Johny Chess Referring Phys: [place]  1. Left ventricular ejection fraction, by estimation, is 65 to 70%. The left ventricle has normal function. The left ventricle has no regional wall motion abnormalities. Left ventricular diastolic parameters are consistent with Grade I diastolic dysfunction (impaired relaxation). Elevated left ventricular end-diastolic  pressure.  2. Right ventricular systolic function is normal. The right ventricular size is normal.  3. The  mitral valve is normal in structure. Moderate mitral valve regurgitation. No evidence of mitral stenosis.  4. Tricuspid valve regurgitation is mild to moderate.  5. The aortic valve is tricuspid. Aortic valve regurgitation is moderate. Mild aortic valve stenosis. Aortic valve mean gradient measures 7.0 mmHg.  6. The inferior vena cava is normal in size with greater than 50% respiratory variability, suggesting right atrial pressure of 3 mmHg. FINDINGS  Left Ventricle: Left ventricular ejection fraction, by estimation, is 65 to 70%. The left ventricle has normal function. The left ventricle has no regional wall motion abnormalities. The left ventricular internal cavity size was normal in size. There is  no left ventricular hypertrophy. Left ventricular diastolic parameters are consistent with Grade I diastolic dysfunction (impaired relaxation). Elevated left ventricular end-diastolic pressure. Right Ventricle: The right ventricular size is normal. No increase in right ventricular wall thickness. Right ventricular systolic function is normal. Left Atrium: Left atrial size was normal in size. Right Atrium: Right atrial size was normal in size. Pericardium: There is no evidence of pericardial effusion. Mitral Valve: The mitral valve is normal in structure. There is moderate thickening of the mitral valve leaflet(s). Normal mobility of the mitral valve leaflets. Moderate mitral annular calcification. Moderate mitral valve regurgitation. No evidence of mitral valve stenosis. MV peak gradient, 11.6 mmHg. The mean mitral valve gradient is 4.0 mmHg. Tricuspid Valve: The tricuspid valve is normal in structure. Tricuspid valve regurgitation is mild to moderate. No evidence of tricuspid stenosis. Aortic Valve: The aortic valve is tricuspid. . There is severe thickening and severe calcifcation of the aortic valve. Aortic valve regurgitation is moderate. Aortic regurgitation PHT measures 416 msec. Mild aortic stenosis is present.  There is severe thickening of the aortic valve. There is severe calcifcation of the aortic valve. Aortic valve mean gradient measures 7.0 mmHg. Pulmonic Valve: The pulmonic valve was normal in structure. Pulmonic valve regurgitation is not visualized. No evidence of pulmonic stenosis. Aorta: The aortic root is normal in size and structure. Venous: The inferior vena cava is normal in size with greater than 50% respiratory variability, suggesting right atrial pressure of 3 mmHg. IAS/Shunts: No atrial level shunt detected by color flow Doppler.  LEFT VENTRICLE PLAX 2D LVIDd:         3.60 cm  Diastology LVIDs:         2.70 cm  LV e' lateral:   5.00 cm/s LV PW:         1.10 cm  LV E/e' lateral: 11.2 LV IVS:        0.90 cm  LV e' medial:    2.94 cm/s LVOT diam:     1.80 cm  LV E/e' medial:  19.0 LV SV:         58 LV SV Index:   34 LVOT Area:     2.54 cm  RIGHT VENTRICLE RV S prime:     9.14 cm/s TAPSE (M-mode): 1.5 cm LEFT ATRIUM             Index LA diam:        3.20 cm 1.91 cm/m LA Vol (A2C):   45.6 ml 27.17 ml/m LA Vol (A4C):   48.0 ml 28.60 ml/m LA Biplane Vol: 46.9 ml 27.94 ml/m  AORTIC VALVE AV Mean Grad: 7.0 mmHg LVOT Vmax:    121.00 cm/s LVOT Vmean:   77.700 cm/s LVOT VTI:  0.227 m AI PHT:       416 msec  AORTA Ao Root diam: 2.80 cm Ao Asc diam:  3.30 cm MITRAL VALVE MV Peak grad: 11.6 mmHg     SHUNTS MV Mean grad: 4.0 mmHg      Systemic VTI:  0.23 m MV Vmax:      1.70 m/s      Systemic Diam: 1.80 cm MV Vmean:     85.7 cm/s MV E velocity: 56.00 cm/s MV A velocity: 140.00 cm/s MV E/A ratio:  0.40 Ena Dawley MD Electronically signed by Ena Dawley MD Signature Date/Time: 07/03/2019/5:33:02 PM    Final     ASSESSMENT AND PLAN: This is a very pleasant 79 years old African-American female with a stage Ia non-small cell lung cancer, adenocarcinoma status post left upper lobectomy with lymph node dissection in March 2019 under the care of Dr. Roxan Hockey. The patient has been on observation since that  time and she is feeling fine today with no concerning complaints. She had repeat CT scan of the chest performed recently.  I personally and independently reviewed the scans and discussed the results with the patient today. Her scan showed no concerning findings for disease recurrence or metastasis but the patient has a suspicious lesion in the upper pole of the left kidney. I recommended for the patient to continue on observation with repeat CT scan of the chest in 1 year. Regarding the renal mass, I will refer the patient to [facility] urology for evaluation and recommendation. The patient was advised to call immediately if she has any concerning symptoms in the interval. The patient voices understanding of current disease status and treatment options and is in agreement with the current care plan. All questions were answered. The patient knows to call the clinic with any problems, questions or concerns. We can certainly see the patient much sooner if necessary.  Disclaimer: This note was dictated with voice recognition software. Similar sounding words can inadvertently be transcribed and may not be corrected upon review.

## 2019-07-30 NOTE — Telephone Encounter (Signed)
Faxed referral to [facility]. Per 5/12 los.

## 2019-07-31 ENCOUNTER — Other Ambulatory Visit: Payer: Self-pay | Admitting: Internal Medicine

## 2019-07-31 DIAGNOSIS — Z1231 Encounter for screening mammogram for malignant neoplasm of breast: Secondary | ICD-10-CM

## 2019-08-04 ENCOUNTER — Other Ambulatory Visit: Payer: Self-pay

## 2019-08-04 MED ORDER — TICAGRELOR 90 MG PO TABS: 90.0000 mg | ORAL_TABLET | Freq: Two times a day (BID) | ORAL | 3 refills | Status: DC

## 2019-08-04 NOTE — Telephone Encounter (Signed)
RECEIVED APPROVAL LETTER FROM [facility] THEY NEED WRITTEN RX FOR BRILINTA. WILL FAX

## 2019-08-14 ENCOUNTER — Telehealth: Payer: Self-pay | Admitting: Cardiology

## 2019-08-14 NOTE — Telephone Encounter (Signed)
New Message   Patient is calling because she has been feeling sick overall. She says its no chest pain, just extreme pain in her legs. She says its hard for her to bend over to wash her feet as well as having loss of appetite.

## 2019-08-14 NOTE — Telephone Encounter (Signed)
Agree with follow up and need to call PCP

## 2019-08-14 NOTE — Telephone Encounter (Signed)
Spoke with the patient who states that she has not been feeling well over the past two days. She states that her legs are very sore and she is having trouble bending over to wash her feet. She states that she has been nauseous and has not had an appetitie. She has been eating just enough so that she is able to tolerate her medications. She states that she is drinking plenty of fluids. She denies SOB, chest pain, dizziness, or fever. She states that she is fatigued. She has a FU appointment with Coletta Memos already scheduled for 06/01. I advised her to also follow up with her PCP.

## 2019-08-15 ENCOUNTER — Encounter (HOSPITAL_COMMUNITY): Payer: Self-pay

## 2019-08-15 ENCOUNTER — Other Ambulatory Visit: Payer: Self-pay

## 2019-08-15 ENCOUNTER — Ambulatory Visit (HOSPITAL_COMMUNITY)
Admission: EM | Admit: 2019-08-15 | Discharge: 2019-08-15 | Disposition: A | Discharge status: Home / Self Care | Payer: Medicare Other | Attending: Internal Medicine | Admitting: Internal Medicine

## 2019-08-15 DIAGNOSIS — Z7984 Long term (current) use of oral hypoglycemic drugs: Secondary | ICD-10-CM | POA: Insufficient documentation

## 2019-08-15 DIAGNOSIS — Z20822 Contact with and (suspected) exposure to covid-19: Secondary | ICD-10-CM | POA: Insufficient documentation

## 2019-08-15 DIAGNOSIS — E039 Hypothyroidism, unspecified: Secondary | ICD-10-CM | POA: Insufficient documentation

## 2019-08-15 DIAGNOSIS — G4733 Obstructive sleep apnea (adult) (pediatric): Secondary | ICD-10-CM | POA: Diagnosis not present

## 2019-08-15 DIAGNOSIS — I25119 Atherosclerotic heart disease of native coronary artery with unspecified angina pectoris: Secondary | ICD-10-CM | POA: Insufficient documentation

## 2019-08-15 DIAGNOSIS — E669 Obesity, unspecified: Secondary | ICD-10-CM | POA: Insufficient documentation

## 2019-08-15 DIAGNOSIS — Z6825 Body mass index (BMI) 25.0-25.9, adult: Secondary | ICD-10-CM | POA: Diagnosis not present

## 2019-08-15 DIAGNOSIS — F5089 Other specified eating disorder: Secondary | ICD-10-CM | POA: Diagnosis not present

## 2019-08-15 DIAGNOSIS — R1084 Generalized abdominal pain: Secondary | ICD-10-CM | POA: Diagnosis not present

## 2019-08-15 DIAGNOSIS — E119 Type 2 diabetes mellitus without complications: Secondary | ICD-10-CM | POA: Diagnosis not present

## 2019-08-15 DIAGNOSIS — R102 Pelvic and perineal pain: Secondary | ICD-10-CM | POA: Insufficient documentation

## 2019-08-15 DIAGNOSIS — Z79899 Other long term (current) drug therapy: Secondary | ICD-10-CM | POA: Diagnosis not present

## 2019-08-15 DIAGNOSIS — D509 Iron deficiency anemia, unspecified: Secondary | ICD-10-CM | POA: Insufficient documentation

## 2019-08-15 DIAGNOSIS — K219 Gastro-esophageal reflux disease without esophagitis: Secondary | ICD-10-CM | POA: Insufficient documentation

## 2019-08-15 DIAGNOSIS — Z955 Presence of coronary angioplasty implant and graft: Secondary | ICD-10-CM | POA: Diagnosis not present

## 2019-08-15 DIAGNOSIS — R197 Diarrhea, unspecified: Secondary | ICD-10-CM | POA: Diagnosis not present

## 2019-08-15 DIAGNOSIS — M1990 Unspecified osteoarthritis, unspecified site: Secondary | ICD-10-CM | POA: Diagnosis not present

## 2019-08-15 DIAGNOSIS — R531 Weakness: Secondary | ICD-10-CM | POA: Diagnosis not present

## 2019-08-15 DIAGNOSIS — M8580 Other specified disorders of bone density and structure, unspecified site: Secondary | ICD-10-CM | POA: Insufficient documentation

## 2019-08-15 DIAGNOSIS — Z7982 Long term (current) use of aspirin: Secondary | ICD-10-CM | POA: Insufficient documentation

## 2019-08-15 DIAGNOSIS — E785 Hyperlipidemia, unspecified: Secondary | ICD-10-CM | POA: Insufficient documentation

## 2019-08-15 DIAGNOSIS — R195 Other fecal abnormalities: Secondary | ICD-10-CM

## 2019-08-15 DIAGNOSIS — R439 Unspecified disturbances of smell and taste: Secondary | ICD-10-CM | POA: Insufficient documentation

## 2019-08-15 DIAGNOSIS — I10 Essential (primary) hypertension: Secondary | ICD-10-CM | POA: Diagnosis not present

## 2019-08-15 MED ORDER — PROBIOTIC 1-250 BILLION-MG PO CAPS: 1.0000 | ORAL_CAPSULE | Freq: Every day | ORAL | 0 refills | Status: DC

## 2019-08-15 MED ORDER — PROCHLORPERAZINE MALEATE 10 MG PO TABS: 10.0000 mg | ORAL_TABLET | Freq: Two times a day (BID) | ORAL | 0 refills | Status: DC | PRN

## 2019-08-15 NOTE — Discharge Instructions (Addendum)
Stool sample results pending Please go to main entrance and outpatient lab to have labs drawn- I am faxing the orders over- call our number if there are any issues Begin daily probiotic Compazine as needed for nausea  Follow up with primary care if symptoms persisting

## 2019-08-15 NOTE — ED Triage Notes (Signed)
PT c/o N/V/D, HAx1 wk. Pt states she can't eat without keep having diarrhea. Pt states she vomited 3 times in the past week. Pt Pt states had diarrhea about 21 times in the past week.

## 2019-08-16 LAB — GASTROINTESTINAL PANEL BY PCR, STOOL (REPLACES STOOL CULTURE)

## 2019-08-16 LAB — CBC
HCT: 31.8 % — ABNORMAL LOW (ref 36.0–46.0)
Hemoglobin: 10.3 g/dL — ABNORMAL LOW (ref 12.0–15.0)
MCH: 27.5 pg (ref 26.0–34.0)
MCHC: 32.4 g/dL (ref 30.0–36.0)
MCV: 85 fL (ref 80.0–100.0)
Platelets: 341 10*3/uL (ref 150–400)
RBC: 3.74 MIL/uL — ABNORMAL LOW (ref 3.87–5.11)
RDW: 14.4 % (ref 11.5–15.5)
WBC: 8.6 10*3/uL (ref 4.0–10.5)
nRBC: 0 % (ref 0.0–0.2)

## 2019-08-16 LAB — COMPREHENSIVE METABOLIC PANEL
ALT: 518 U/L — ABNORMAL HIGH (ref 0–44)
AST: 556 U/L — ABNORMAL HIGH (ref 15–41)
Albumin: 3.4 g/dL — ABNORMAL LOW (ref 3.5–5.0)
Alkaline Phosphatase: 103 U/L (ref 38–126)
Anion gap: 13 (ref 5–15)
BUN: 36 mg/dL — ABNORMAL HIGH (ref 8–23)
CO2: 22 mmol/L (ref 22–32)
Calcium: 9 mg/dL (ref 8.9–10.3)
Chloride: 107 mmol/L (ref 98–111)
Creatinine, Ser: 2.24 mg/dL — ABNORMAL HIGH (ref 0.44–1.00)
GFR calc Af Amer: 24 mL/min — ABNORMAL LOW (ref >60)
GFR calc non Af Amer: 21 mL/min — ABNORMAL LOW (ref >60)
Glucose, Bld: 185 mg/dL — ABNORMAL HIGH (ref 70–99)
Potassium: 3.3 mmol/L — ABNORMAL LOW (ref 3.5–5.1)
Sodium: 142 mmol/L (ref 135–145)
Total Bilirubin: 0.7 mg/dL (ref 0.3–1.2)
Total Protein: 7.7 g/dL (ref 6.5–8.1)

## 2019-08-16 LAB — SARS CORONAVIRUS 2 (TAT 6-24 HRS): SARS Coronavirus 2: NEGATIVE

## 2019-08-16 LAB — LIPASE, BLOOD: Lipase: 66 U/L — ABNORMAL HIGH (ref 11–51)

## 2019-08-16 NOTE — ED Provider Notes (Signed)
[place]    CSN: [id number] Arrival date & time: 08/15/19  1654      History   Chief Complaint Chief Complaint  Patient presents with  . COVID symptoms    HPI CHAE SHUSTER is a 79 y.o. female history of CAD, GERD, hypertension, hypothyroid, DM type II, arthritis, presenting today for evaluation of looser stools and loss of appetite.  Patient reports over the past 2 to 3 weeks she has had looser more frequent stools than normal.  She does report some associated abdominal pain that comes and goes.  She has had some nausea and decreased appetite.  Reports poor oral intake.  Because of this she has felt very fatigued and has had some generalized weakness.  She denies fevers.  Denies associated URI symptoms.  Does report loss of taste, but remains to have her smell.  Denies close sick contacts.  Denies history of prior stomach issues.  Initially thought may be related to taking magnesium that she was on 800 mg for hypomagnesium, but stopped this in symptoms slightly improved, but have persisted.  She denies any recent travel.  Denies any recent antibiotic use.  Denies blood in stool.  HPI  Past Medical History:  Diagnosis Date  . Atherosclerosis of aorta ([place])    a. 01/2017/03/2017 - noted on high res chest CTs.  . Breast cancer ([place])    a. Bilateral --> s/p left mastectomy  . Carotid artery disease ([place])    a. 09/8293 w/ 62-13% LICA stenosis and <08% RICA stenosis; b. 10/2015 Carotid U/S: < 50% BICA stenosis  . Chest pain    a. 09/2011 MV: EF 68%, no ischemia/infarct.  . Chronic anemia   . Chronic headaches    denies  . Coronary artery calcification seen on CT scan    a. 01/2017 High res CT: atherosclerotic calcification of the arterial vascularture, including severe involvement of the coronary arteries; b. 03/2017 CT Chest: coronary and Ao atheroscelrosis.  Marland Kitchen GERD (gastroesophageal reflux disease)   . History of echocardiogram    a. 09/2011 Echo: EF 55-60%, no rwma,  triv AI, PASP 50mmHg.  Marland Kitchen Hyperlipidemia   . Hypertension   . Hyperthyroidism   . Left upper lobe pulmonary nodule    a. 02/2017 PET: slowing enlarging 1.7cm LUL nodule w/ low-grade metabolic activity; b. 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 05/2017 s/p VATS.  . Multinodular goiter    a. 02/2017 PET scan- Hypermetabolic nodule;  b. 08/9627 s/p thyroidectomy  . Obesity   . OSA on CPAP    cpap  . Osteoarthritis   . Personal history of radiation therapy 1999  . Right bundle branch block   . Type II or unspecified type diabetes mellitus without mention of complication, uncontrolled     Patient Active Problem List   Diagnosis Date Noted  . NSTEMI (non-ST elevated myocardial infarction) ([place]) 07/02/2019  . Dysphonia 05/21/2018  . Pelvic floor relaxation 10/02/2017  . Blood in urine 10/02/2017  . Left lower quadrant pain 10/02/2017  . Nausea 10/02/2017  . Nocturia 10/02/2017  . Osteopenia 10/02/2017  . Pain in pelvis 10/02/2017  . Adenocarcinoma of left lung, stage 1 ([place]) 07/26/2017  . Iron deficiency anemia 07/26/2017  . S/P lobectomy of lung   . Long QT interval   . Sinus tachycardia   . Abnormal positron emission tomography (PET) scan 05/06/2017  . Pain of left shoulder joint on movement 04/09/2017  . Rib pain 04/09/2017  . BPPV (benign paroxysmal positional vertigo),  left 03/27/2017  . Lipoma 04/06/2016  . Hypersomnia 01/18/2016  . Upper airway resistance syndrome 01/18/2016  . Hyperthyroidism 10/05/2011  . Chest pain at rest 10/04/2011  . Angina at rest [facility]) 10/04/2011  . Headache(784.0) 10/04/2011  . FH: MI (myocardial infarction) 10/04/2011  . Chest pressure 10/04/2011  . Hyperlipidemia   . Hypertension   . OSA on CPAP   . Breast cancer ([place])   . Chronic anemia   . GERD (gastroesophageal reflux disease)   . Obesity   . Osteoarthritis   . Right bundle branch block   . Atherosclerosis of aorta ([place])   . Multiple thyroid nodules   . Type II or unspecified  type diabetes mellitus without mention of complication, uncontrolled   . Chronic headaches   . OBESITY 11/17/2009  . OBSTRUCTIVE SLEEP APNEA 01/05/2009  . Type 2 diabetes mellitus with complication, without long-term current use of insulin ([place]) 11/12/2008  . HYPERLIPIDEMIA 11/12/2008  . Essential hypertension 11/12/2008  . Lung nodule 11/12/2008  . G E R D 11/12/2008  . UNSPECIFIED DISORDER OF KIDNEY AND URETER 11/12/2008  . OSTEOARTHRITIS 11/12/2008    Past Surgical History:  Procedure Laterality Date  . BREAST BIOPSY  1993; 1995; 2000   left; right; left  . BREAST EXCISIONAL BIOPSY Right pt unsure  . CORONARY STENT INTERVENTION N/A 07/03/2019   Procedure: CORONARY STENT INTERVENTION;  Surgeon: Troy Sine, MD;  Location: [place] [facility];  Service: Cardiovascular;  Laterality: N/A;  RCA  . LEFT HEART CATH AND CORONARY ANGIOGRAPHY N/A 07/03/2019   Procedure: LEFT HEART CATH AND CORONARY ANGIOGRAPHY;  Surgeon: Troy Sine, MD;  Location: [place] [facility];  Service: Cardiovascular;  Laterality: N/A;  . MASTECTOMY Left 2000   left  . THYROIDECTOMY  05/10/2017   VIDEO BRONCHOSCOPY WITH ENDOBRONCHIAL NAVIGATION (N/A)  . THYROIDECTOMY N/A 05/10/2017   Procedure: TOTAL THYROIDECTOMY;  Surgeon: Armandina Gemma, MD;  Location: [place];  Service: General;  Laterality: N/A;  . VIDEO ASSISTED THORACOSCOPY (VATS)/ LOBECTOMY Left 06/04/2017   Procedure: VIDEO ASSISTED THORACOSCOPY (VATS)/LEFT UPPER LOBECTOMY;  Surgeon: [name], MD;  Location: [place];  Service: Thoracic;  Laterality: Left;  Marland Kitchen VIDEO BRONCHOSCOPY WITH ENDOBRONCHIAL NAVIGATION N/A 05/10/2017   Procedure: VIDEO BRONCHOSCOPY WITH ENDOBRONCHIAL NAVIGATION;  Surgeon: [name], MD;  Location: [place];  Service: Thoracic;  Laterality: N/A;  . VIDEO BRONCHOSCOPY WITH ENDOBRONCHIAL ULTRASOUND N/A 06/04/2017   Procedure: VIDEO BRONCHOSCOPY WITH ENDOBRONCHIAL ULTRASOUND;  Surgeon: [name], MD;  Location:  [place];  Service: Thoracic;  Laterality: N/A;    OB History   No obstetric history on file.      Home Medications    Prior to Admission medications   Medication Sig Start Date End Date Taking? Authorizing Provider  acetaminophen (TYLENOL) 500 MG tablet Take 1,000 mg by mouth every 6 (six) hours as needed for moderate pain or headache.    [provider]  amLODipine (NORVASC) 10 MG tablet Take 10 mg by mouth daily.    [provider]  aspirin 81 MG tablet Take 81 mg by mouth daily.      [provider]  Bacillus Coagulans-Inulin (PROBIOTIC) 1-250 BILLION-MG CAPS Take 1 capsule by mouth daily. 08/15/19   Starlena Beil C, PA-C  Cobalamin Combinations (B-12) [phone] MCG SUBL Place 1 tablet under the tongue daily.     [provider]  empagliflozin (JARDIANCE) 25 MG TABS tablet Take 25 mg by mouth daily.    [provider]  ezetimibe (ZETIA) 10 MG tablet Take 10 mg by mouth every evening.     [provider]  ferrous sulfate 325 (65 FE) MG tablet Take 325 mg by mouth 2 (two) times daily. 11/04/18   [provider]  hydrocortisone cream 1 % Apply 1 application topically 2 (two) times daily as needed for itching.     [provider]  levothyroxine (SYNTHROID, LEVOTHROID) 88 MCG tablet Take 88 mcg by mouth daily. 07/02/17   [provider]  linagliptin (TRADJENTA) 5 MG TABS tablet Take 5 mg by mouth daily.    [provider]  Magnesium Oxide 400 (240 Mg) MG TABS Take 400 mg by mouth 2 (two) times daily.     [provider]  metoprolol tartrate (LOPRESSOR) 50 MG tablet Take 0.5 tablets (25 mg total) by mouth 2 (two) times daily. 07/14/19   Deberah Pelton, NP  NONFORMULARY OR COMPOUNDED ITEM [facility]:  Achilles Tendonitis #12 - Diclofenac 3%, Baclofen 2%, Bupivacaine 1%, Gabapentin 6%, Pentoxifylline 3%, Topiramate 1%. Apply 1-2 grams to affected area 3-4 times daily. 05/28/18   Wallene Huh, DPM  Omega-3 Fatty Acids (FISH OIL) 1200 MG CAPS Take 1,200 mg by mouth 2 (two) times daily.     [provider]  omeprazole (PRILOSEC) 20 MG capsule Take 20 mg by mouth 2 (two) times daily. Takes prn    [provider]  prochlorperazine (COMPAZINE) 10 MG tablet Take 1 tablet (10 mg total) by mouth 2 (two) times daily as needed for nausea or vomiting. 08/15/19   Shawndra Clute C, PA-C  ticagrelor (BRILINTA) 90 MG TABS tablet Take 1 tablet (90 mg total) by mouth 2 (two) times daily. 08/04/19   Deberah Pelton, NP  metFORMIN (GLUCOPHAGE) 1000 MG tablet Take 2,000 mg by mouth daily.   08/15/19  [provider]  rosuvastatin (CRESTOR) 20 MG tablet Take 40 mg by mouth every evening.   08/15/19  [provider]    Family History Family History  Problem Relation Age of Onset  . Diabetes Brother        x3  . Hypertension Brother        x3  . Diabetes Brother   . Stroke Brother   . Heart attack Mother 81       Mother Died of MI age 33  . Diabetes Sister   . Stroke Sister     Social History Social History   Tobacco Use  . Smoking status: Never Smoker  . Smokeless tobacco: Never Used  Substance Use Topics  . Alcohol use: Yes    Comment: occ  . Drug use: No     Allergies   Tramadol, Lisinopril, and Tapazole [methimazole]   Review of Systems Review of Systems  Constitutional: Positive for appetite change and fatigue. Negative for activity change, chills and fever.  HENT: Negative for congestion, ear pain, rhinorrhea, sinus pressure, sore throat and trouble swallowing.   Eyes: Negative for discharge and redness.  Respiratory: Negative for cough, chest tightness and shortness of breath.   Cardiovascular: Negative for chest pain.  Gastrointestinal: Positive for abdominal pain, diarrhea and nausea. Negative for vomiting.  Musculoskeletal: Negative for myalgias.  Skin: Negative for rash.  Neurological: Positive for light-headedness. Negative for  dizziness and headaches.     Physical Exam Triage Vital Signs ED Triage Vitals  Enc Vitals Group     BP 08/15/19 1716 (!) 153/73     Pulse Rate 08/15/19  1716 89     Resp 08/15/19 1716 18     Temp 08/15/19 1716 98.6 F (37 C)     Temp Source 08/15/19 1716 Oral     SpO2 08/15/19 1716 100 %     Weight 08/15/19 1717 140 lb (63.5 kg)     Height 08/15/19 1717 5\' 2"  (1.575 m)     Head Circumference --      Peak Flow --      Pain Score 08/15/19 1716 6     Pain Loc --      Pain Edu? --      Excl. in [place]? --    No data found.  Updated Vital Signs BP (!) 153/73   Pulse 89   Temp 98.6 F (37 C) (Oral)   Resp 18   Ht 5\' 2"  (1.575 m)   Wt 140 lb (63.5 kg)   SpO2 100%   BMI 25.61 kg/m   Visual Acuity Right Eye Distance:   Left Eye Distance:   Bilateral Distance:    Right Eye Near:   Left Eye Near:    Bilateral Near:     Physical Exam Vitals and nursing note reviewed.  Constitutional:      Appearance: She is well-developed.     Comments: No acute distress  HENT:     Head: Normocephalic and atraumatic.     Nose: Nose normal.     Mouth/Throat:     Comments: Oral mucosa pink and moist, no tonsillar enlargement or exudate. Posterior pharynx patent and nonerythematous, no uvula deviation or swelling. Normal phonation. Eyes:     Extraocular Movements: Extraocular movements intact.     Conjunctiva/sclera: Conjunctivae normal.     Pupils: Pupils are equal, round, and reactive to light.  Cardiovascular:     Rate and Rhythm: Normal rate.  Pulmonary:     Effort: Pulmonary effort is normal. No respiratory distress.     Comments: Breathing comfortably at rest, CTABL, no wheezing, rales or other adventitious sounds auscultated Abdominal:     General: There is no distension.     Comments: Soft, nondistended, generalized tenderness throughout abdomen, no focal tenderness, negative rebound, negative Rovsing, negative McBurney's  Musculoskeletal:        General: Normal range of  motion.     Cervical back: Neck supple.     Comments: Ambulating with walker  Skin:    General: Skin is warm and dry.  Neurological:     Mental Status: She is alert and oriented to person, place, and time.      UC Treatments / Results  Labs (all labs ordered are listed, but only abnormal results are displayed) Labs Reviewed  CBC - Abnormal; Notable for the following components:      Result Value   RBC 3.74 (*)    Hemoglobin 10.3 (*)    HCT 31.8 (*)    All other components within normal limits  COMPREHENSIVE METABOLIC PANEL - Abnormal; Notable for the following components:   Potassium 3.3 (*)    Glucose, Bld 185 (*)    BUN 36 (*)    Creatinine, Ser 2.24 (*)    Albumin 3.4 (*)    AST 556 (*)    ALT 518 (*)    GFR calc non Af Amer 21 (*)    GFR calc Af Amer 24 (*)    All other components within normal limits  LIPASE, BLOOD - Abnormal; Notable for the following components:   Lipase 66 (*)  All other components within normal limits  SARS CORONAVIRUS 2 (TAT 6-24 HRS)  GASTROINTESTINAL PANEL BY PCR, STOOL (REPLACES STOOL CULTURE)    EKG   Radiology No results found.  Procedures Procedures (including critical care time)  Medications Ordered in UC Medications - No data to display  Initial Impression / Assessment and Plan / UC Course  I have reviewed the triage vital signs and the nursing notes.  Pertinent labs & imaging results that were available during my care of the patient were reviewed by me and considered in my medical decision making (see chart for details).     Looser more frequent stools x2-3 weeks, will obtain GI pathogen panel to rule out and check for any potential infectious etiology.  Recommending basic labs in order to evaluate electrolytes given patient's age and persistent symptoms, as well as any potential underlying abdominal pathology.  Unable to obtain blood work in clinic, sending to outpatient lab tomorrow to obtain these results.  In the  meantime recommending probiotics and treatment of nausea.  Has history of QT prolongation, avoiding Zofran and Phenergan.  Will provide Compazine as alternative.  If symptoms persistent and continuing to not return to normal appetite may need further work-up with PCP/gastroenterology.  Called patient to discuss results GI pathogen panel negative, Covid negative, does have slightly low potassium, kidney function elevated along with elevated LFTs.  Stressed importance of oral rehydration and increasing fluids to help with dehydration contributing to elevated kidney function.  Also recommended to follow-up with PCP for recheck of labs and further evaluation of elevated LFTs.  If developing increased abdominal pain or persistent symptoms with increased fatigue/dizziness/lightheadedness to follow-up in emergency room for further evaluation and fluid repletion. Answered patients questions.  Discussed strict return precautions. Patient verbalized understanding and is agreeable with plan.  Final Clinical Impressions(s) / UC Diagnoses   Final diagnoses:  Loose stools  Generalized abdominal pain     Discharge Instructions     Stool sample results pending Please go to main entrance and outpatient lab to have labs drawn- I am faxing the orders over- call our number if there are any issues Begin daily probiotic Compazine as needed for nausea  Follow up with primary care if symptoms persisting   ED Prescriptions    Medication Sig Dispense Auth. Provider   Bacillus Coagulans-Inulin (PROBIOTIC) 1-250 BILLION-MG CAPS Take 1 capsule by mouth daily. 30 capsule Worley Radermacher C, PA-C   prochlorperazine (COMPAZINE) 10 MG tablet Take 1 tablet (10 mg total) by mouth 2 (two) times daily as needed for nausea or vomiting. 20 tablet Margrete Delude, [place] C, PA-C     PDMP not reviewed this encounter.   Janith Lima, PA-C 08/16/19 1608

## 2019-08-18 NOTE — Progress Notes (Signed)
Cardiology Clinic Note   Patient Name: Karen West Date of Encounter: 08/19/2019  Primary Care Provider:  Deland Pretty, MD Primary Cardiologist:  Minus Breeding, MD  Patient Profile    Karen West 79 year old female presents today for follow-up of her NSTEMI on 07/03/2019.  Past Medical History    Past Medical History:  Diagnosis Date  . Atherosclerosis of aorta ([place])    a. 01/2017/03/2017 - noted on high res chest CTs.  . Breast cancer ([place])    a. Bilateral --> s/p left mastectomy  . Carotid artery disease ([place])    a. 0/3546 w/ 56-81% LICA stenosis and <27% RICA stenosis; b. 10/2015 Carotid U/S: < 50% BICA stenosis  . Chest pain    a. 09/2011 MV: EF 68%, no ischemia/infarct.  . Chronic anemia   . Chronic headaches    denies  . Coronary artery calcification seen on CT scan    a. 01/2017 High res CT: atherosclerotic calcification of the arterial vascularture, including severe involvement of the coronary arteries; b. 03/2017 CT Chest: coronary and Ao atheroscelrosis.  Marland Kitchen GERD (gastroesophageal reflux disease)   . History of echocardiogram    a. 09/2011 Echo: EF 55-60%, no rwma, triv AI, PASP 44mmHg.  Marland Kitchen Hyperlipidemia   . Hypertension   . Hyperthyroidism   . Left upper lobe pulmonary nodule    a. 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 05/2017 s/p VATS.  . Multinodular goiter    a. 02/2017 PET scan- Hypermetabolic nodule;  b. 03/7492 s/p thyroidectomy  . Obesity   . OSA on CPAP    cpap  . Osteoarthritis   . Personal history of radiation therapy 1999  . Right bundle branch block   . Type II or unspecified type diabetes mellitus without mention of complication, uncontrolled    Past Surgical History:  Procedure Laterality Date  . BREAST BIOPSY  1993; 1995; 2000   left; right; left  . BREAST EXCISIONAL BIOPSY Right pt unsure  . CORONARY STENT INTERVENTION N/A 07/03/2019   Procedure: CORONARY STENT INTERVENTION;  Surgeon: Troy Sine, MD;  Location: [facility];  Service: Cardiovascular;  Laterality: N/A;  RCA  . LEFT HEART CATH AND CORONARY ANGIOGRAPHY N/A 07/03/2019   Procedure: LEFT HEART CATH AND CORONARY ANGIOGRAPHY;  Surgeon: Troy Sine, MD;  Location: [facility];  Service: Cardiovascular;  Laterality: N/A;  . MASTECTOMY Left 2000   left  . THYROIDECTOMY  05/10/2017   VIDEO BRONCHOSCOPY WITH ENDOBRONCHIAL NAVIGATION (N/A)  . THYROIDECTOMY N/A 05/10/2017   Procedure: TOTAL THYROIDECTOMY;  Surgeon: Armandina Gemma, MD;  Location: [place];  Service: General;  Laterality: N/A;  . VIDEO ASSISTED THORACOSCOPY (VATS)/ LOBECTOMY Left 06/04/2017   Procedure: VIDEO ASSISTED THORACOSCOPY (VATS)/LEFT UPPER LOBECTOMY;  Surgeon: [name], MD;  Location: [place] [facility];  Service: Thoracic;  Laterality: Left;  Marland Kitchen VIDEO BRONCHOSCOPY WITH ENDOBRONCHIAL NAVIGATION N/A 05/10/2017   Procedure: VIDEO BRONCHOSCOPY WITH ENDOBRONCHIAL NAVIGATION;  Surgeon: [name], MD;  Location: [place];  Service: Thoracic;  Laterality: N/A;  . VIDEO BRONCHOSCOPY WITH ENDOBRONCHIAL ULTRASOUND N/A 06/04/2017   Procedure: VIDEO BRONCHOSCOPY WITH ENDOBRONCHIAL ULTRASOUND;  Surgeon: [name], MD;  Location: [facility];  Service: Thoracic;  Laterality: N/A;    Allergies  Allergies  Allergen Reactions  . Tramadol Nausea And Vomiting and Other (See Comments)    "got sick to my stomach and was throwing up blood; it put  me in the hospital")  . Lisinopril Rash and Other (See Comments)    Renal failure   . 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  She presented to her PCP for evaluation of 2 weeks of intermittent chest pain.  Her CP was worse with exertion and had associated shortness of breath.  During her PCP appointment she had a recurrent episode of chest pain and was sent to the emergency department.  In the emergency department she was given aspirin and found to have HS troponin of 26 with repeat of 52.  Her CXR showed no acute cardiopulmonary disease with mitral annulus calcification as well as aortic atherosclerosis.  EKG showed new complete RBBB with more pronounced DWI in leads V1, V3, and III.  She is also noted to have dependent lower extremity edema.  She was started on IV heparin at that time and admitted.  She was taken to the Cath Lab on 07/03/2019 and was found to have a proximal-mid LAD lesion 70%, proximal LAD lesion 40%, mid RCA to distal RCA lesion 80%, DES successfully placed, distal RCA 95% DES was successfully placed.  Femoral approach was used.  Total DES x4 to RCA.  She presented the clinic 07/14/2019 for follow-up evaluation with her sister and stated she was feeling better than she did prior to her cardiac catheterization.  She had been walking for 5 minutes every other day.  She was ready to do more around her house and started driving.  She was interested in further prevention/risk reduction.  We talked about the importance of medication compliance, diet, exercise, and keeping follow-up appointments.  She stated that her blood pressure was slightly elevated at home running between 140s and 130s.  I will increased her metoprolol to 50 mg twice daily, repeated a BMP due to her AKI, and had her follow-up in 1 month for reevaluation.  She presents the clinic today for follow-up evaluation and states she has had loose stools for 1 to 2 weeks.  They recently subsided over the last 1 to 2 days.  She states she was taking  magnesium and has stopped the medication.  Her stools have returned to normal.  She states that she does not feel back to normal yet but continues to hydrate and eat small bites of food.  She has an appointment with her PCP on June 22.  I will give her instructions for the brat diet, encourage her to continue p.o. hydration, and have her return in 1 month for reevaluation.  Today she denies chest pain, shortness of breath, lower extremity edema, fatigue, palpitations, melena, hematuria, hemoptysis, diaphoresis, weakness, presyncope, syncope, orthopnea, and PND.  Home Medications    Prior to Admission medications   Medication Sig Start Date End Date Taking? Authorizing Provider  acetaminophen (TYLENOL) 500 MG tablet Take 1,000 mg by mouth every 6 (six) hours as needed for moderate pain or headache.    [provider]  amLODipine (NORVASC) 10 MG tablet Take 10 mg by mouth daily.    [provider]  aspirin 81 MG tablet Take 81 mg by mouth daily.  [provider]  Bacillus Coagulans-Inulin (PROBIOTIC) 1-250 BILLION-MG CAPS Take 1 capsule by mouth daily. 08/15/19   Wieters, Hallie C, PA-C  Cobalamin Combinations (B-12) [phone] MCG SUBL Place 1 tablet under the tongue daily.     [provider]  empagliflozin (JARDIANCE) 25 MG TABS tablet Take 25 mg by mouth daily.    [provider]  ezetimibe (ZETIA) 10 MG tablet Take 10 mg by mouth every evening.     [provider]  ferrous sulfate 325 (65 FE) MG tablet Take 325 mg by mouth 2 (two) times daily. 11/04/18   [provider]  hydrocortisone cream 1 % Apply 1 application topically 2 (two) times daily as needed for itching.     [provider]  levothyroxine (SYNTHROID, LEVOTHROID) 88 MCG tablet Take 88 mcg by mouth daily. 07/02/17   [provider]  linagliptin (TRADJENTA) 5 MG TABS tablet Take 5 mg by mouth daily.    [provider]  Magnesium Oxide 400 (240 Mg)  MG TABS Take 400 mg by mouth 2 (two) times daily.     [provider]  metoprolol tartrate (LOPRESSOR) 50 MG tablet Take 0.5 tablets (25 mg total) by mouth 2 (two) times daily. 07/14/19   Deberah Pelton, NP  NONFORMULARY OR COMPOUNDED ITEM [facility]:  Achilles Tendonitis #12 - Diclofenac 3%, Baclofen 2%, Bupivacaine 1%, Gabapentin 6%, Pentoxifylline 3%, Topiramate 1%. Apply 1-2 grams to affected area 3-4 times daily. 05/28/18   Wallene Huh, DPM  Omega-3 Fatty Acids (FISH OIL) 1200 MG CAPS Take 1,200 mg by mouth 2 (two) times daily.     [provider]  omeprazole (PRILOSEC) 20 MG capsule Take 20 mg by mouth 2 (two) times daily. Takes prn    [provider]  prochlorperazine (COMPAZINE) 10 MG tablet Take 1 tablet (10 mg total) by mouth 2 (two) times daily as needed for nausea or vomiting. 08/15/19   Wieters, Hallie C, PA-C  ticagrelor (BRILINTA) 90 MG TABS tablet Take 1 tablet (90 mg total) by mouth 2 (two) times daily. 08/04/19   Deberah Pelton, NP  metFORMIN (GLUCOPHAGE) 1000 MG tablet Take 2,000 mg by mouth daily.   08/15/19  [provider]  rosuvastatin (CRESTOR) 20 MG tablet Take 40 mg by mouth every evening.   08/15/19  [provider]    Family History    Family History  Problem Relation Age of Onset  . Diabetes Brother        x3  . Hypertension Brother        x3  . Diabetes Brother   . Stroke Brother   . Heart attack Mother 53       Mother Died of MI age 54  . Diabetes Sister   . Stroke Sister    She indicated that her mother is deceased. She indicated that her father is deceased. She indicated that two of her four brothers are alive.  Social History    Social History   Socioeconomic History  . Marital status: Widowed    Spouse name: Not on file  . Number of children: Not on file  . Years of education: Not on file  . Highest education level: Not on file  Occupational History  . Occupation: retired    Fish farm manager:  RETIRED    Comment: accountant  Tobacco Use  . Smoking status: Never Smoker  . Smokeless tobacco: Never Used  Substance and Sexual Activity  . Alcohol use: Yes  Comment: occ  . Drug use: No  . Sexual activity: Yes    Partners: Male    Birth control/protection: None  Other Topics Concern  . Not on file  Social History Narrative   Lives alone.  Four children.    Social Determinants of Health   Financial Resource Strain:   . Difficulty of Paying Living Expenses:   Food Insecurity:   . Worried About Charity fundraiser in the Last Year:   . Arboriculturist in the Last Year:   Transportation Needs:   . Film/video editor (Medical):   Marland Kitchen Lack of Transportation (Non-Medical):   Physical Activity:   . Days of Exercise per Week:   . Minutes of Exercise per Session:   Stress:   . Feeling of Stress :   Social Connections:   . Frequency of Communication with Friends and Family:   . Frequency of Social Gatherings with Friends and Family:   . Attends Religious Services:   . Active Member of Clubs or Organizations:   . Attends Archivist Meetings:   Marland Kitchen Marital Status:   Intimate Partner Violence:   . Fear of Current or Ex-Partner:   . Emotionally Abused:   Marland Kitchen Physically Abused:   . Sexually Abused:      Review of Systems    General:  No chills, fever, night sweats or weight changes.  Cardiovascular:  No chest pain, dyspnea on exertion, edema, orthopnea, palpitations, paroxysmal nocturnal dyspnea. Dermatological: No rash, lesions/masses Respiratory: No cough, dyspnea Urologic: No hematuria, dysuria Abdominal:   No nausea, vomiting, diarrhea, bright red blood per rectum, melena, or hematemesis Neurologic:  No visual changes, wkns, changes in mental status. All other systems reviewed and are otherwise negative except as noted above.  Physical Exam    VS:  BP (!) 114/52 (BP Location: Right Arm, Patient Position: Sitting, Cuff Size: Normal)   Pulse 87   Temp 98  F (36.7 C)   Ht 5\' 2"  (1.575 m)   Wt 141 lb (64 kg)   BMI 25.79 kg/m  , BMI Body mass index is 25.79 kg/m. GEN: Well nourished, well developed, in no acute distress. HEENT: normal. Neck: Supple, no JVD, carotid bruits, or masses. Cardiac: RRR, no murmurs, rubs, or gallops. No clubbing, cyanosis, edema.  Radials/DP/PT 2+ and equal bilaterally.  Respiratory:  Respirations regular and unlabored, clear to auscultation bilaterally. GI: Soft, nontender, nondistended, BS + x 4. MS: no deformity or atrophy. Skin: warm and dry, no rash. Neuro:  Strength and sensation are intact. Psych: Normal affect.  Accessory Clinical Findings    ECG personally reviewed by me today-sinus rhythm with PACs incomplete right bundle branch block prolonged QT 87 bpm  EKG 07/14/2019 normal sinus rhythm possible left atrial enlargement no ST or T wave deviation 79 bpm  Echocardiogram 06/23/2019 1. Left ventricular ejection fraction, by estimation, is 65 to 70%. The  left ventricle has normal function. The left ventricle has no regional  wall motion abnormalities. Left ventricular diastolic parameters are  consistent with Grade I diastolic  dysfunction (impaired relaxation). Elevated left ventricular end-diastolic  pressure.  2. Right ventricular systolic function is normal. The right ventricular  size is normal.  3. The mitral valve is normal in structure. Moderate mitral valve  regurgitation. No evidence of mitral stenosis.  4. Tricuspid valve regurgitation is mild to moderate.  5. The aortic valve is tricuspid. Aortic valve regurgitation is moderate.  Mild aortic valve stenosis. Aortic valve mean  gradient measures 7.0 mmHg.  6. The inferior vena cava is normal in size with greater than 50%  respiratory variability, suggesting right atrial pressure of 3 mmHg.  Cardiac catheterization 07/03/2019  Cardiac Catheterization 07/03/19  Prox LAD to Mid LAD lesion is 70% stenosed.  Prox LAD lesion is 40%  stenosed.  Mid RCA to Dist RCA lesion is 80% stenosed.  A stent was successfully placed.  Dist RCA lesion is 95% stenosed.  Post intervention, there is a 0% residual stenosis.  Post intervention, there is a 0% residual stenosis.  There is severe mitral annular calcification.   The LAD has moderate diffuse calcification with 40 followed by 70% proximal stenosis prior to the takeoff of the first diagonal vessel with 20% mid stenosis.  Left circumflex vessel is tortuous and free of significant obstructive disease.  The RCA is a dominant vessel that had 80% diffuse stenosis in the region of the acute margin followed by 95% focal distal stenosis.  Transient 2-1/complete heart block with insertion of catheter into the LV with spontaneous resolution.  PCI performed via the femoral approach after transitioning from the radial approach which was associated with significant radial artery spasm.  Very difficult PCI with very difficult catheter backup with initial PTCA of the distal and acute margin stenoses with subsequent development of guiding catheter induced very proximal RCA dissection extending to the acute margin. Utilizing telescope guidliner support from distal to near ostial for Resolute Onyx DES stents were inserted with the most distal stent being a 2.5 x 18 mm stent followed by 2.5 x 22 mm stent, 2.5 x 38 mm stent and the most proximal stent a 2.75 x 18 mm Resolute Onyx stent with an excellent angiographic result with brisk TIMI-3 flow and no evidence for residual dissection.  RECOMMENDATION: Patient will be hydrated post procedure. Continue DAPT for minimum of 12 months. Initiate medical therapy for ischemic disease. May need future CSI atherectomy to the LAD lesion which is significantly calcified. Aggressive lipid-lowering therapy.  Coronary Diagrams  Diagnostic Dominance: Right  Intervention       Assessment & Plan   1.  Essential hypertension-BP  today 114/52 . Had previously been [id number] systolic at home. Continue amlodipine Continue metoprolol to 50 mg twice daily Heart healthy low-sodium diet-salty 6 given Increase physical activity as tolerated  NSTEMI-EKG today shows normal sinus rhythm with PACs incomplete right bundle branch block 87 bpm .  Received DES x4 to his RCA.  LAD with 40% and 70% calcified stenosis.  DAPT aspirin and Brilinta for 12 months prescribed.  Echocardiogram showed an EF of 65-70%, no WMA, G1 DD, moderate MR, mild to moderate TR, moderate AR. Continue aspirin Continue Brilinta-assistance sheet given Continue statin Continue amlodipine Continue metoprolol 50 mg twice daily Continue cardiac rehab Heart healthy low-sodium diet-salty 6 given Increase physical activity as tolerated  Hyperlipidemia-07/03/2019: Cholesterol 121; HDL 37; LDL Cholesterol 61; Triglycerides 113; VLDL 23 Continue ezetimibe Continue rosuvastatin Heart healthy low-sodium high-fiber diet Increase physical activity as tolerated  Acute kidney injury-creatinine at discharge 1.36 07/05/19.  On follow-up creatinine 1.53 07/11/19 Increase p.o. hydration  Loose stools-had loose stools for 2 weeks.  Have subsided over the last 2 days. Liver enzymes elevated, creatinine elevated Continue p.o. hydration Brat diet-instructions given  Disposition: Follow-up with  me in 1 month.   Jossie Ng. Orry Sigl NP-C    08/19/2019, 10:28 AM [place] [address] Office [phone] Fax [phone]

## 2019-08-19 ENCOUNTER — Ambulatory Visit (INDEPENDENT_AMBULATORY_CARE_PROVIDER_SITE_OTHER): Payer: Medicare Other | Admitting: General Practice

## 2019-08-19 ENCOUNTER — Telehealth: Payer: Self-pay | Admitting: General Practice

## 2019-08-19 ENCOUNTER — Other Ambulatory Visit: Payer: Self-pay

## 2019-08-19 ENCOUNTER — Encounter: Payer: Self-pay | Admitting: General Practice

## 2019-08-19 VITALS — BP 114/52 | HR 87 | Temp 98.0°F | Ht 62.0 in | Wt 141.0 lb

## 2019-08-19 DIAGNOSIS — R195 Other fecal abnormalities: Secondary | ICD-10-CM | POA: Diagnosis not present

## 2019-08-19 DIAGNOSIS — I10 Essential (primary) hypertension: Secondary | ICD-10-CM

## 2019-08-19 DIAGNOSIS — I214 Non-ST elevation (NSTEMI) myocardial infarction: Secondary | ICD-10-CM | POA: Diagnosis not present

## 2019-08-19 DIAGNOSIS — N179 Acute kidney failure, unspecified: Secondary | ICD-10-CM

## 2019-08-19 DIAGNOSIS — E7800 Pure hypercholesterolemia, unspecified: Secondary | ICD-10-CM

## 2019-08-19 MED ORDER — METOPROLOL TARTRATE 50 MG PO TABS: 25.0000 mg | ORAL_TABLET | Freq: Two times a day (BID) | ORAL | 3 refills | Status: DC

## 2019-08-19 NOTE — Telephone Encounter (Signed)
Pt c/o medication issue:  1. Name of Medication: metoprolol tartrate (LOPRESSOR) 50 MG tablet  2. How are you currently taking this medication (dosage and times per day)? 50 mg twice daily  3. Are you having a reaction (difficulty breathing--STAT)? sick  4. What is your medication issue? Pt realized she is taking too much of this medication.   She just called the pharmacy to get the medication refilled, and her pharmacist said she should only be taking 1/2 a tablet at each dose but she is taking 1 full tablet at each dose. The pharmacist said that may be the reason why she has been getting so sick. She wanted to let Denyse Amass know she had been taking the medication incorrectly

## 2019-08-19 NOTE — Patient Instructions (Addendum)
Medication Instructions:  The current medical regimen is effective;  continue present plan and medications as directed. Please refer to the Current Medication list given to you today. *If you need a refill on your cardiac medications before your next appointment, please call your pharmacy*  Special Instructions PLEASE READ AND FOLLOW BRATT DIET-ATTACHED-MAINTAIN HYDRATION  PLEASE READ AND FOLLOW SALTY 6-ATTACHED  Follow-Up: Your next appointment:  1 month(s)  In Person with JESSE CLEAVER, FNP-C  At [facility], you and your health needs are our priority.  As part of our continuing mission to provide you with exceptional heart care, we have created designated Provider Care Teams.  These Care Teams include your primary Cardiologist (physician) and Advanced Practice Providers (APPs -  Physician Assistants and Nurse Practitioners) who all work together to provide you with the care you need, when you need it.   Bland Diet (BRATT) A bland diet consists of foods that are often soft and do not have a lot of fat, fiber, or extra seasonings. Foods without fat, fiber, or seasoning are easier for the body to digest. They are also less likely to irritate your mouth, throat, stomach, and other parts of your digestive system. A bland diet is sometimes called a BRAT diet. What is my plan? Your health care provider or food and nutrition specialist (dietitian) may recommend specific changes to your diet to prevent symptoms or to treat your symptoms. These changes may include:  Eating small meals often.  Cooking food until it is soft enough to chew easily.  Chewing your food well.  Drinking fluids slowly.  Not eating foods that are very spicy, sour, or fatty.  Not eating citrus fruits, such as oranges and grapefruit. What do I need to know about this diet?  Eat a variety of foods from the bland diet food list.  Do not follow a bland diet longer than needed.  Ask your health care provider whether  you should take vitamins or supplements. What foods can I eat? Grains  Hot cereals, such as cream of wheat. Rice. Bread, crackers, or tortillas made from refined white flour. Vegetables Canned or cooked vegetables. Mashed or boiled potatoes. Fruits  Bananas. Applesauce. Other types of cooked or canned fruit with the skin and seeds removed, such as canned peaches or pears. Meats and other proteins  Scrambled eggs. Creamy peanut butter or other nut butters. Lean, well-cooked meats, such as chicken or fish. Tofu. Soups or broths. Dairy Low-fat dairy products, such as milk, cottage cheese, or yogurt. Beverages  Water. Herbal tea. Apple juice. Fats and oils Mild salad dressings. Canola or olive oil. Sweets and desserts Pudding. Custard. Fruit gelatin. Ice cream. The items listed above may not be a complete list of recommended foods and beverages. Contact a dietitian for more options. What foods are not recommended? Grains Whole grain breads and cereals. Vegetables Raw vegetables. Fruits Raw fruits, especially citrus, berries, or dried fruits. Dairy Whole fat dairy foods. Beverages Caffeinated drinks. Alcohol. Seasonings and condiments Strongly flavored seasonings or condiments. Hot sauce. Salsa. Other foods Spicy foods. Fried foods. Sour foods, such as pickled or fermented foods. Foods with high sugar content. Foods high in fiber. The items listed above may not be a complete list of foods and beverages to avoid. Contact a dietitian for more information. Summary  A bland diet consists of foods that are often soft and do not have a lot of fat, fiber, or extra seasonings.  Foods without fat, fiber, or seasoning are easier for  the body to digest.  Check with your health care provider to see how long you should follow this diet plan. It is not meant to be followed for long periods. This information is not intended to replace advice given to you by your health care provider. Make  sure you discuss any questions you have with your health care provider. Document Revised: 04/04/2017 Document Reviewed: 04/04/2017 Elsevier Patient Education  2020 [employer].

## 2019-08-19 NOTE — Telephone Encounter (Signed)
Spoke with patient. Patient has been taking a whole tablet of metoprolol BID instead of half a tablet BID.   Patient is going to decrease to 1/2 tablet twice a day of metoprolol and monitor her blood pressure. If blood pressure starts to run high she will call back in to adjust medication.   Refill sent to preferred pharmacy per patient request.

## 2019-08-21 DIAGNOSIS — B179 Acute viral hepatitis, unspecified: Secondary | ICD-10-CM | POA: Diagnosis not present

## 2019-08-21 DIAGNOSIS — N289 Disorder of kidney and ureter, unspecified: Secondary | ICD-10-CM | POA: Diagnosis not present

## 2019-08-21 DIAGNOSIS — N184 Chronic kidney disease, stage 4 (severe): Secondary | ICD-10-CM | POA: Diagnosis not present

## 2019-08-21 DIAGNOSIS — M791 Myalgia, unspecified site: Secondary | ICD-10-CM | POA: Diagnosis not present

## 2019-08-21 DIAGNOSIS — E1121 Type 2 diabetes mellitus with diabetic nephropathy: Secondary | ICD-10-CM | POA: Diagnosis not present

## 2019-08-21 DIAGNOSIS — R945 Abnormal results of liver function studies: Secondary | ICD-10-CM | POA: Diagnosis not present

## 2019-08-22 ENCOUNTER — Emergency Department (HOSPITAL_COMMUNITY): Payer: Medicare Other

## 2019-08-22 ENCOUNTER — Encounter (HOSPITAL_COMMUNITY): Payer: Self-pay | Admitting: Emergency Medicine

## 2019-08-22 ENCOUNTER — Inpatient Hospital Stay (HOSPITAL_COMMUNITY)
Admission: EM | Admit: 2019-08-22 | Discharge: 2019-08-26 | DRG: 558 | Disposition: A | Discharge status: Home / Self Care | Payer: Medicare Other | Attending: Internal Medicine | Admitting: Internal Medicine

## 2019-08-22 ENCOUNTER — Other Ambulatory Visit: Payer: Self-pay

## 2019-08-22 DIAGNOSIS — Z20822 Contact with and (suspected) exposure to covid-19: Secondary | ICD-10-CM | POA: Diagnosis present

## 2019-08-22 DIAGNOSIS — E118 Type 2 diabetes mellitus with unspecified complications: Secondary | ICD-10-CM | POA: Diagnosis not present

## 2019-08-22 DIAGNOSIS — Z7984 Long term (current) use of oral hypoglycemic drugs: Secondary | ICD-10-CM

## 2019-08-22 DIAGNOSIS — Z9989 Dependence on other enabling machines and devices: Secondary | ICD-10-CM | POA: Diagnosis not present

## 2019-08-22 DIAGNOSIS — Z888 Allergy status to other drugs, medicaments and biological substances status: Secondary | ICD-10-CM

## 2019-08-22 DIAGNOSIS — N1831 Chronic kidney disease, stage 3a: Secondary | ICD-10-CM | POA: Diagnosis present

## 2019-08-22 DIAGNOSIS — N183 Chronic kidney disease, stage 3 unspecified: Secondary | ICD-10-CM | POA: Diagnosis present

## 2019-08-22 DIAGNOSIS — Z955 Presence of coronary angioplasty implant and graft: Secondary | ICD-10-CM

## 2019-08-22 DIAGNOSIS — E890 Postprocedural hypothyroidism: Secondary | ICD-10-CM | POA: Diagnosis present

## 2019-08-22 DIAGNOSIS — Z8249 Family history of ischemic heart disease and other diseases of the circulatory system: Secondary | ICD-10-CM

## 2019-08-22 DIAGNOSIS — D649 Anemia, unspecified: Secondary | ICD-10-CM | POA: Diagnosis present

## 2019-08-22 DIAGNOSIS — R197 Diarrhea, unspecified: Secondary | ICD-10-CM | POA: Diagnosis present

## 2019-08-22 DIAGNOSIS — I2510 Atherosclerotic heart disease of native coronary artery without angina pectoris: Secondary | ICD-10-CM | POA: Diagnosis present

## 2019-08-22 DIAGNOSIS — R0602 Shortness of breath: Secondary | ICD-10-CM | POA: Diagnosis not present

## 2019-08-22 DIAGNOSIS — E1169 Type 2 diabetes mellitus with other specified complication: Secondary | ICD-10-CM | POA: Diagnosis present

## 2019-08-22 DIAGNOSIS — Z79890 Hormone replacement therapy (postmenopausal): Secondary | ICD-10-CM

## 2019-08-22 DIAGNOSIS — Z902 Acquired absence of lung [part of]: Secondary | ICD-10-CM

## 2019-08-22 DIAGNOSIS — Z823 Family history of stroke: Secondary | ICD-10-CM

## 2019-08-22 DIAGNOSIS — I779 Disorder of arteries and arterioles, unspecified: Secondary | ICD-10-CM | POA: Diagnosis present

## 2019-08-22 DIAGNOSIS — I10 Essential (primary) hypertension: Secondary | ICD-10-CM | POA: Diagnosis not present

## 2019-08-22 DIAGNOSIS — Z79899 Other long term (current) drug therapy: Secondary | ICD-10-CM

## 2019-08-22 DIAGNOSIS — I129 Hypertensive chronic kidney disease with stage 1 through stage 4 chronic kidney disease, or unspecified chronic kidney disease: Secondary | ICD-10-CM | POA: Diagnosis present

## 2019-08-22 DIAGNOSIS — Z85118 Personal history of other malignant neoplasm of bronchus and lung: Secondary | ICD-10-CM | POA: Diagnosis not present

## 2019-08-22 DIAGNOSIS — M791 Myalgia, unspecified site: Secondary | ICD-10-CM | POA: Diagnosis not present

## 2019-08-22 DIAGNOSIS — R7401 Elevation of levels of liver transaminase levels: Secondary | ICD-10-CM | POA: Diagnosis present

## 2019-08-22 DIAGNOSIS — I252 Old myocardial infarction: Secondary | ICD-10-CM | POA: Diagnosis not present

## 2019-08-22 DIAGNOSIS — C3492 Malignant neoplasm of unspecified part of left bronchus or lung: Secondary | ICD-10-CM | POA: Diagnosis present

## 2019-08-22 DIAGNOSIS — Z7982 Long term (current) use of aspirin: Secondary | ICD-10-CM

## 2019-08-22 DIAGNOSIS — I4510 Unspecified right bundle-branch block: Secondary | ICD-10-CM | POA: Diagnosis present

## 2019-08-22 DIAGNOSIS — E785 Hyperlipidemia, unspecified: Secondary | ICD-10-CM | POA: Diagnosis present

## 2019-08-22 DIAGNOSIS — E0590 Thyrotoxicosis, unspecified without thyrotoxic crisis or storm: Secondary | ICD-10-CM | POA: Diagnosis not present

## 2019-08-22 DIAGNOSIS — Z7902 Long term (current) use of antithrombotics/antiplatelets: Secondary | ICD-10-CM

## 2019-08-22 DIAGNOSIS — K219 Gastro-esophageal reflux disease without esophagitis: Secondary | ICD-10-CM | POA: Diagnosis present

## 2019-08-22 DIAGNOSIS — Z9012 Acquired absence of left breast and nipple: Secondary | ICD-10-CM

## 2019-08-22 DIAGNOSIS — Z923 Personal history of irradiation: Secondary | ICD-10-CM

## 2019-08-22 DIAGNOSIS — E1165 Type 2 diabetes mellitus with hyperglycemia: Secondary | ICD-10-CM | POA: Diagnosis not present

## 2019-08-22 DIAGNOSIS — Z803 Family history of malignant neoplasm of breast: Secondary | ICD-10-CM

## 2019-08-22 DIAGNOSIS — N390 Urinary tract infection, site not specified: Secondary | ICD-10-CM | POA: Diagnosis not present

## 2019-08-22 DIAGNOSIS — E1122 Type 2 diabetes mellitus with diabetic chronic kidney disease: Secondary | ICD-10-CM | POA: Diagnosis present

## 2019-08-22 DIAGNOSIS — D631 Anemia in chronic kidney disease: Secondary | ICD-10-CM | POA: Diagnosis present

## 2019-08-22 DIAGNOSIS — N179 Acute kidney failure, unspecified: Secondary | ICD-10-CM | POA: Diagnosis not present

## 2019-08-22 DIAGNOSIS — Z8739 Personal history of other diseases of the musculoskeletal system and connective tissue: Secondary | ICD-10-CM | POA: Diagnosis not present

## 2019-08-22 DIAGNOSIS — Z833 Family history of diabetes mellitus: Secondary | ICD-10-CM

## 2019-08-22 DIAGNOSIS — E1121 Type 2 diabetes mellitus with diabetic nephropathy: Secondary | ICD-10-CM | POA: Diagnosis not present

## 2019-08-22 DIAGNOSIS — Z09 Encounter for follow-up examination after completed treatment for conditions other than malignant neoplasm: Secondary | ICD-10-CM | POA: Diagnosis not present

## 2019-08-22 DIAGNOSIS — Z853 Personal history of malignant neoplasm of breast: Secondary | ICD-10-CM

## 2019-08-22 DIAGNOSIS — T466X5A Adverse effect of antihyperlipidemic and antiarteriosclerotic drugs, initial encounter: Secondary | ICD-10-CM | POA: Diagnosis present

## 2019-08-22 DIAGNOSIS — G4733 Obstructive sleep apnea (adult) (pediatric): Secondary | ICD-10-CM

## 2019-08-22 DIAGNOSIS — M6282 Rhabdomyolysis: Secondary | ICD-10-CM | POA: Diagnosis not present

## 2019-08-22 LAB — URINALYSIS, ROUTINE W REFLEX MICROSCOPIC
Bacteria, UA: NONE SEEN
Bilirubin Urine: NEGATIVE
Glucose, UA: >=500 mg/dL — AB
Ketones, ur: NEGATIVE mg/dL
Leukocytes,Ua: NEGATIVE
Nitrite: NEGATIVE
Protein, ur: 100 mg/dL — AB
Specific Gravity, Urine: 1.01 (ref 1.005–1.030)
pH: 6 (ref 5.0–8.0)

## 2019-08-22 LAB — HEPATIC FUNCTION PANEL
ALT: 525 U/L — ABNORMAL HIGH (ref 0–44)
AST: 289 U/L — ABNORMAL HIGH (ref 15–41)
Albumin: 3.3 g/dL — ABNORMAL LOW (ref 3.5–5.0)
Alkaline Phosphatase: 97 U/L (ref 38–126)
Bilirubin, Direct: <0.1 mg/dL (ref 0.0–0.2)
Total Bilirubin: 0.6 mg/dL (ref 0.3–1.2)
Total Protein: 7.2 g/dL (ref 6.5–8.1)

## 2019-08-22 LAB — CBC
HCT: 29.9 % — ABNORMAL LOW (ref 36.0–46.0)
Hemoglobin: 9.5 g/dL — ABNORMAL LOW (ref 12.0–15.0)
MCH: 27.8 pg (ref 26.0–34.0)
MCHC: 31.8 g/dL (ref 30.0–36.0)
MCV: 87.4 fL (ref 80.0–100.0)
Platelets: 333 10*3/uL (ref 150–400)
RBC: 3.42 MIL/uL — ABNORMAL LOW (ref 3.87–5.11)
RDW: 15.2 % (ref 11.5–15.5)
WBC: 9.4 10*3/uL (ref 4.0–10.5)
nRBC: 0 % (ref 0.0–0.2)

## 2019-08-22 LAB — BASIC METABOLIC PANEL
Anion gap: 13 (ref 5–15)
BUN: 38 mg/dL — ABNORMAL HIGH (ref 8–23)
CO2: 22 mmol/L (ref 22–32)
Calcium: 8.4 mg/dL — ABNORMAL LOW (ref 8.9–10.3)
Chloride: 103 mmol/L (ref 98–111)
Creatinine, Ser: 1.88 mg/dL — ABNORMAL HIGH (ref 0.44–1.00)
GFR calc Af Amer: 30 mL/min — ABNORMAL LOW (ref >60)
GFR calc non Af Amer: 25 mL/min — ABNORMAL LOW (ref >60)
Glucose, Bld: 268 mg/dL — ABNORMAL HIGH (ref 70–99)
Potassium: 4 mmol/L (ref 3.5–5.1)
Sodium: 138 mmol/L (ref 135–145)

## 2019-08-22 LAB — CK: Total CK: 5585 U/L — ABNORMAL HIGH (ref 38–234)

## 2019-08-22 MED ORDER — SODIUM CHLORIDE 0.9 % IV SOLN: INTRAVENOUS | Status: AC

## 2019-08-22 MED ORDER — SODIUM CHLORIDE 0.9 % IV BOLUS
500.0000 mL | Freq: Once | INTRAVENOUS | Status: AC
  Administered 2019-08-22: 500 mL via INTRAVENOUS

## 2019-08-22 MED ORDER — TICAGRELOR 90 MG PO TABS
90.0000 mg | ORAL_TABLET | Freq: Two times a day (BID) | ORAL | Status: DC
  Administered 2019-08-22 – 2019-08-26 (×8): 90 mg via ORAL
  Filled 2019-08-22 (×8): qty 1

## 2019-08-22 MED ORDER — ASPIRIN 81 MG PO CHEW
81.0000 mg | CHEWABLE_TABLET | Freq: Every day | ORAL | Status: DC
  Administered 2019-08-23 – 2019-08-26 (×4): 81 mg via ORAL
  Filled 2019-08-22 (×4): qty 1

## 2019-08-22 MED ORDER — METOPROLOL TARTRATE 25 MG PO TABS
25.0000 mg | ORAL_TABLET | Freq: Two times a day (BID) | ORAL | Status: DC
  Administered 2019-08-22 – 2019-08-26 (×8): 25 mg via ORAL
  Filled 2019-08-22 (×8): qty 1

## 2019-08-22 NOTE — ED Notes (Signed)
Lab notified of add on Hepatic function panel to blood already collected

## 2019-08-22 NOTE — ED Notes (Signed)
Pt is a very difficult stick, unsuccessful x 2. IV team consulted

## 2019-08-22 NOTE — ED Triage Notes (Signed)
Patient arrives to ED with complaints of shortness of breath and weakness for the past two weeks. Patient states that her MD sent her here for IV fluids. Patient had N/V/D for the last couple of weeks as well.

## 2019-08-22 NOTE — H&P (Signed)
History and Physical    Karen West DTO:[id number] DOB: May 11, 1940 DOA: 08/22/2019  PCP: Deland Pretty, MD   Patient coming from: Home   Chief Complaint: Recent diarrhea, sore legs b/l, abnormal labs   HPI: Karen West is a 79 y.o. female with medical history significant for coronary artery disease with DES placed in April 2021, history of lung cancer status post resection and now under observation, chronic renal insufficiency, chronic anemia, type 2 diabetes mellitus, and OSA on CPAP, now presenting to emergency department for evaluation of recent diarrhea, bilateral leg tenderness, and abnormal outpatient blood work.  Patient reports that her statin was doubled a couple months ago, she developed nonbloody diarrhea that has now resolved after stopping her magnesium supplement, but she has developed tenderness in the bilateral lower extremities over the past week or so without any trauma or inciting event.  She was seen at urgent care about a week ago when she was having diarrhea, had negative COVID-19 test and negative GI pathogen panel at that time but was noted to have elevated LFTs.  She reports follow-up blood work that included an elevated serum CK and increased creatinine, and she was directed to the ED for evaluation and management of this.  ED Course: Upon arrival to the ED, patient is found to be afebrile, saturating well on room air, and with stable blood pressure.  EKG features sinus rhythm with incomplete RBBB.  Chest x-ray notable for postoperative changes and scarring without edema or airspace opacity.  Chemistry panel notable for AST 284, ALT 525, normal bilirubin, and creatinine 1.88, improved from a week ago, but up from an apparent baseline of roughly 1.15.  CBC features a stable chronic normocytic anemia with hemoglobin 9.5.  Patient was given 500 cc of saline in the ED.  Covid PCR screening test pending.  Review of Systems:  All other systems reviewed and apart from HPI,  are negative.  Past Medical History:  Diagnosis Date  . Atherosclerosis of aorta ([place])    a. 01/2017/03/2017 - noted on high res chest CTs.  . Breast cancer ([place])    a. Bilateral --> s/p left mastectomy  . Carotid artery disease ([place])    a. 11/8336 w/ 25-05% LICA stenosis and <39% RICA stenosis; b. 10/2015 Carotid U/S: < 50% BICA stenosis  . Chest pain    a. 09/2011 MV: EF 68%, no ischemia/infarct.  . Chronic anemia   . Chronic headaches    denies  . Coronary artery calcification seen on CT scan    a. 01/2017 High res CT: atherosclerotic calcification of the arterial vascularture, including severe involvement of the coronary arteries; b. 03/2017 CT Chest: coronary and Ao atheroscelrosis.  Marland Kitchen GERD (gastroesophageal reflux disease)   . History of echocardiogram    a. 09/2011 Echo: EF 55-60%, no rwma, triv AI, PASP 23mHg.  .Marland KitchenHyperlipidemia   . Hypertension   . Hyperthyroidism   . Left upper lobe pulmonary nodule    a. 02/2017 PET: slowing enlarging 1.7cm LUL nodule w/ low-grade metabolic activity; b. 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 05/2017 s/p VATS.  . Multinodular goiter    a. 02/2017 PET scan- Hypermetabolic nodule;  b. 21/9379s/p thyroidectomy  . Obesity   . OSA on CPAP    cpap  . Osteoarthritis   . Personal history of radiation therapy 1999  . Right bundle branch block   . Type II or unspecified type diabetes mellitus without mention of complication, uncontrolled     Past  Surgical History:  Procedure Laterality Date  . BREAST BIOPSY  1993; 1995; 2000   left; right; left  . BREAST EXCISIONAL BIOPSY Right pt unsure  . CORONARY STENT INTERVENTION N/A 07/03/2019   Procedure: CORONARY STENT INTERVENTION;  Surgeon: Troy Sine, MD;  Location: [facility];  Service: Cardiovascular;  Laterality: N/A;  RCA  . LEFT HEART CATH AND CORONARY ANGIOGRAPHY N/A 07/03/2019   Procedure: LEFT HEART CATH AND CORONARY ANGIOGRAPHY;  Surgeon: Troy Sine, MD;  Location: [facility];  Service: Cardiovascular;  Laterality: N/A;  . MASTECTOMY Left 2000   left  . THYROIDECTOMY  05/10/2017   VIDEO BRONCHOSCOPY WITH ENDOBRONCHIAL NAVIGATION (N/A)  . THYROIDECTOMY N/A 05/10/2017   Procedure: TOTAL THYROIDECTOMY;  Surgeon: Armandina Gemma, MD;  Location: [facility];  Service: General;  Laterality: N/A;  . VIDEO ASSISTED THORACOSCOPY (VATS)/ LOBECTOMY Left 06/04/2017   Procedure: VIDEO ASSISTED THORACOSCOPY (VATS)/LEFT UPPER LOBECTOMY;  Surgeon: [name], MD;  Location: [facility];  Service: Thoracic;  Laterality: Left;  Marland Kitchen VIDEO BRONCHOSCOPY WITH ENDOBRONCHIAL NAVIGATION N/A 05/10/2017   Procedure: VIDEO BRONCHOSCOPY WITH ENDOBRONCHIAL NAVIGATION;  Surgeon: [name], MD;  Location: [facility];  Service: Thoracic;  Laterality: N/A;  . VIDEO BRONCHOSCOPY WITH ENDOBRONCHIAL ULTRASOUND N/A 06/04/2017   Procedure: VIDEO BRONCHOSCOPY WITH ENDOBRONCHIAL ULTRASOUND;  Surgeon: [name], MD;  Location: [facility];  Service: Thoracic;  Laterality: N/A;     reports that she has never smoked. She has never used smokeless tobacco. She reports current alcohol use. She reports that she does not use drugs.  Allergies  Allergen Reactions  . Tramadol Nausea And Vomiting and Other (See Comments)    "got sick to my stomach and was throwing up blood; it put me in the hospital")  . Lisinopril Rash and Other (See Comments)    Renal failure   . Tapazole [Methimazole] Itching and Rash    Family History  Problem Relation Age of Onset  . Diabetes Brother        x3  . Hypertension Brother        x3  . Diabetes Brother   . Stroke Brother   . Heart attack Mother 78       Mother Died of MI age 47  . Diabetes Sister   . Stroke Sister      Prior to Admission medications   Medication Sig Start Date End Date Taking? Authorizing Provider  acetaminophen (TYLENOL) 500 MG tablet Take 1,000 mg by mouth every 6 (six) hours as needed for moderate pain or headache.     [provider]  amLODipine (NORVASC) 10 MG tablet Take 10 mg by mouth daily.    [provider]  aspirin 81 MG tablet Take 81 mg by mouth daily.      [provider]  Bacillus Coagulans-Inulin (PROBIOTIC) 1-250 BILLION-MG CAPS Take 1 capsule by mouth daily. 08/15/19   Wieters, Hallie C, PA-C  Cobalamin Combinations (B-12) [phone] MCG SUBL Place 1 tablet under the tongue daily.     [provider]  empagliflozin (JARDIANCE) 25 MG TABS tablet Take 25 mg by mouth daily.    [provider]  ezetimibe (ZETIA) 10 MG tablet Take 10 mg by mouth every evening.     [provider]  ferrous sulfate 325 (65 FE) MG tablet Take 325 mg by mouth 2 (two) times daily. 11/04/18   [provider]  hydrocortisone cream 1 % Apply 1 application topically 2 (two)  times daily as needed for itching.     [provider]  levothyroxine (SYNTHROID, LEVOTHROID) 88 MCG tablet Take 88 mcg by mouth daily. 07/02/17   [provider]  linagliptin (TRADJENTA) 5 MG TABS tablet Take 5 mg by mouth daily.    [provider]  Magnesium Oxide 400 (240 Mg) MG TABS Take 400 mg by mouth 2 (two) times daily.     [provider]  metFORMIN (GLUCOPHAGE) 1000 MG tablet Take 2,000 mg by mouth daily.    [provider]  metoprolol tartrate (LOPRESSOR) 50 MG tablet Take 0.5 tablets (25 mg total) by mouth 2 (two) times daily. 08/19/19   Deberah Pelton, NP  NONFORMULARY OR COMPOUNDED ITEM [facility]:  Achilles Tendonitis #12 - Diclofenac 3%, Baclofen 2%, Bupivacaine 1%, Gabapentin 6%, Pentoxifylline 3%, Topiramate 1%. Apply 1-2 grams to affected area 3-4 times daily. 05/28/18   Wallene Huh, DPM  Omega-3 Fatty Acids (FISH OIL) 1200 MG CAPS Take 1,200 mg by mouth 2 (two) times daily.     [provider]  omeprazole (PRILOSEC) 20 MG capsule Take 20 mg by mouth 2 (two) times daily. Takes prn    [provider]   rosuvastatin (CRESTOR) 40 MG tablet Take 40 mg by mouth daily.    [provider]  ticagrelor (BRILINTA) 90 MG TABS tablet Take 1 tablet (90 mg total) by mouth 2 (two) times daily. 08/04/19   Deberah Pelton, NP    Physical Exam: Vitals:   08/22/19 1743 08/22/19 1850 08/22/19 1945 08/22/19 2230  BP: (!) 121/58 (!) 155/69 138/63   Pulse: 78 87 77 76  Resp: '18 20 17 '$ (!) 25  Temp: 98.2 F (36.8 C)     TempSrc: Oral     SpO2: 100% 100% 100% 99%  Weight:      Height:        Constitutional: NAD, calm  Eyes: PERTLA, lids and conjunctivae normal ENMT: Mucous membranes are moist. Posterior pharynx clear of any exudate or lesions.   Neck: normal, supple, no masses, no thyromegaly Respiratory:  no wheezing, no crackles. No accessory muscle use.  Cardiovascular: S1 & S2 heard, regular rate and rhythm. No extremity edema.   Abdomen: No distension, no tenderness, soft. Bowel sounds active.  Musculoskeletal: no clubbing / cyanosis. Bilateral thighs and calves are tender but soft and without appreciable swelling or discoloration.   Skin: no significant rashes, lesions, ulcers. Warm, dry, well-perfused. Neurologic: No facial asymmetry. Sensation intact. Moving all extremities.  Psychiatric: Alert and oriented to person, place, and situation. Very pleasant and cooperative.    Labs and Imaging on Admission: I have personally reviewed following labs and imaging studies  CBC: Recent Labs  Lab 08/16/19 1240 08/22/19 1406  WBC 8.6 9.4  HGB 10.3* 9.5*  HCT 31.8* 29.9*  MCV 85.0 87.4  PLT 341 767   Basic Metabolic Panel: Recent Labs  Lab 08/16/19 1240 08/22/19 1406  NA 142 138  K 3.3* 4.0  CL 107 103  CO2 22 22  GLUCOSE 185* 268*  BUN 36* 38*  CREATININE 2.24* 1.88*  CALCIUM 9.0 8.4*   GFR: Estimated Creatinine Clearance: 22.4 mL/min (A) (by C-G formula based on SCr of 1.88 mg/dL (H)). Liver Function Tests: Recent Labs  Lab 08/16/19 1240 08/22/19 1419  AST 556* 289*   ALT 518* 525*  ALKPHOS 103 97  BILITOT 0.7 0.6  PROT 7.7 7.2  ALBUMIN 3.4* 3.3*   Recent Labs  Lab 08/16/19 1240  LIPASE 66*   No results for input(s): AMMONIA in the last 168 hours. Coagulation Profile: No results for input(s): INR, PROTIME in the last 168 hours. Cardiac Enzymes: Recent Labs  Lab 08/22/19 1419  CKTOTAL 5,585*   BNP (last 3 results) No results for input(s): PROBNP in the last 8760 hours. HbA1C: No results for input(s): HGBA1C in the last 72 hours. CBG: No results for input(s): GLUCAP in the last 168 hours. Lipid Profile: No results for input(s): CHOL, HDL, LDLCALC, TRIG, CHOLHDL, LDLDIRECT in the last 72 hours. Thyroid Function Tests: No results for input(s): TSH, T4TOTAL, FREET4, T3FREE, THYROIDAB in the last 72 hours. Anemia Panel: No results for input(s): VITAMINB12, FOLATE, FERRITIN, TIBC, IRON, RETICCTPCT in the last 72 hours. Urine analysis:    Component Value Date/Time   COLORURINE STRAW (A) 08/22/2019 2033   APPEARANCEUR CLEAR 08/22/2019 2033   LABSPEC 1.010 08/22/2019 2033   PHURINE 6.0 08/22/2019 2033   GLUCOSEU >=500 (A) 08/22/2019 2033   HGBUR LARGE (A) 08/22/2019 2033   BILIRUBINUR NEGATIVE 08/22/2019 2033   KETONESUR NEGATIVE 08/22/2019 2033   PROTEINUR 100 (A) 08/22/2019 2033   UROBILINOGEN 1.0 02/07/2008 1645   NITRITE NEGATIVE 08/22/2019 2033   LEUKOCYTESUR NEGATIVE 08/22/2019 2033   Sepsis Labs: '@LABRCNTIP'$ (procalcitonin:4,lacticidven:4) ) Recent Results (from the past 240 hour(s))  SARS CORONAVIRUS 2 (TAT 6-24 HRS) Nasopharyngeal Nasopharyngeal Swab     Status: None   Collection Time: 08/15/19  5:23 PM   Specimen: Nasopharyngeal Swab  Result Value Ref Range Status   SARS Coronavirus 2 NEGATIVE NEGATIVE Final    Comment: (NOTE) SARS-CoV-2 target nucleic acids are NOT DETECTED. The SARS-CoV-2 RNA is generally detectable in upper and lower respiratory specimens during the acute phase of infection. Negative results do not  preclude SARS-CoV-2 infection, do not rule out co-infections with other pathogens, and should not be used as the sole basis for treatment or other patient management decisions. Negative results must be combined with clinical observations, patient history, and epidemiological information. The expected result is Negative. Fact Sheet for Patients: SugarRoll.be Fact Sheet for Healthcare Providers: https://www.woods-mathews.com/ This test is not yet approved or cleared by the [place] FDA and  has been authorized for detection and/or diagnosis of SARS-CoV-2 by FDA under an Emergency Use Authorization (EUA). This EUA will remain  in effect (meaning this test can be used) for the duration of the COVID-19 declaration under Section 56 4(b)(1) of the Act, 21 U.S.C. section 360bbb-3(b)(1), unless the authorization is terminated or revoked sooner. Performed at [facility], [place] [address] [place], [place] [address]   Gastrointestinal Panel by PCR , Stool     Status: None   Collection Time: 08/15/19  7:18 PM   Specimen: Stool  Result Value Ref Range Status   Campylobacter species NOT DETECTED NOT DETECTED Final   Plesimonas shigelloides NOT DETECTED NOT DETECTED Final   Salmonella species NOT DETECTED NOT DETECTED Final   Yersinia enterocolitica NOT DETECTED NOT DETECTED Final   Vibrio species NOT DETECTED NOT DETECTED Final   Vibrio cholerae NOT DETECTED NOT DETECTED Final   Enteroaggregative E coli (EAEC) NOT DETECTED NOT DETECTED Final   Enteropathogenic E coli (EPEC) NOT DETECTED NOT DETECTED Final   Enterotoxigenic E coli (ETEC) NOT DETECTED NOT DETECTED Final   Shiga like toxin producing E coli (STEC) NOT DETECTED NOT DETECTED Final   Shigella/Enteroinvasive E coli (EIEC) NOT DETECTED NOT DETECTED Final   Cryptosporidium NOT DETECTED NOT DETECTED Final   Cyclospora cayetanensis NOT DETECTED  NOT DETECTED Final   Entamoeba histolytica  NOT DETECTED NOT DETECTED Final   Giardia lamblia NOT DETECTED NOT DETECTED Final   Adenovirus F40/41 NOT DETECTED NOT DETECTED Final   Astrovirus NOT DETECTED NOT DETECTED Final   Norovirus GI/GII NOT DETECTED NOT DETECTED Final   Rotavirus A NOT DETECTED NOT DETECTED Final   Sapovirus (I, II, IV, and V) NOT DETECTED NOT DETECTED Final    Comment: Performed at [facility], [address], [place] [employer] [place], [place] [address]     Radiological Exams on Admission: DG Chest 2 View  Result Date: 08/22/2019 CLINICAL DATA:  Shortness of breath EXAM: CHEST - 2 VIEW COMPARISON:  July 02, 2019 FINDINGS: There is scarring and postoperative change on the left with volume loss, stable. Lungs otherwise are clear. The heart size and pulmonary vascularity are normal. There is calcification of the mitral annulus. No adenopathy. There is aortic atherosclerosis. Postoperative changes noted in the right breast region. Patient is status post left mastectomy. There is degenerative change in the thoracic spine. IMPRESSION: Postoperative changes with scarring and volume loss on the left. No edema or airspace opacity. Cardiac silhouette within normal limits. No adenopathy. Postoperative change at multiple sites. Aortic Atherosclerosis (ICD10-I70.0). Electronically Signed   By: Lowella Grip III M.D.   On: 08/22/2019 15:13    EKG: Independently reviewed. Sinus rhythm, incomplete RBBB.   Assessment/Plan   1. Rhabdomyolysis  - Presents for eval/mgmt of elevated CK and creatinine on outpatient blood work, reports that statin was increased in March, she had 2-3 wks of diarrhea that is now resolved, and now has 1 wk of bilateral leg tenderness  - CK is 5585, urine is clear with "large Hgb" but 0-5 rbc/hpf  - Legs are tender but all compartments soft  - Continue IVF hydration, hold statin, monitor renal function    2. Acute kidney injury superimposed on CKD IIIa  - SCr is 1.88 in ED, down from 2.24 on 5/29, but up  from 1.58 on 07/28/19 and 1.15 in April 2021  - Baseline appears to be ~1.15  - Prerenal azotemia in setting of diarrhea vs heme pigment-induced injury  - Check FENa, renally-dose medications, avoid nephrotoxins, continue IVF hydration   3. Elevated transaminases  - AST is 284 and ALT 525 in ED with normal alk phos, normal bili, and benign abdominal exam  - She had been experiencing loose stools and nausea recently without fevers or abdominal pain  - This is likely secondary to rhabdomyolysis, will check viral hepatitis panel, trend LFTs, hold statin    4. CAD - No anginal complaints  - She had NSTEMI in April with DES placed 07/03/19  - Continue DAPT and beta-blocker, hold statin for now as above    5. Anemia  - Hgb is 9.5 on admission, similar to priors  - Patient denies melena, hematochezia, or other bleeding  - Anticipate dilutional drop, type and screen, monitor    6. Type II DM  - A1c was 7.3% in April 2021  - Check CBGs and use low-intensity SSI for now    DVT prophylaxis: sq heparin  Code Status: Full  Family Communication: Discussed with patient  Disposition Plan:  Patient is from: Home  Anticipated d/c is to: Home  Anticipated d/c date is: 08/25/19 Patient currently: Requiring aggressive IVF hydration and frequent lab monitoring  Consults called: None  Admission status: Inpatient     Vianne Bulls, MD [facility] Pager: See [email or web address]  If 7AM-7PM, please  contact the daytime attending [email or web address]  08/22/2019, 11:09 PM

## 2019-08-22 NOTE — ED Provider Notes (Signed)
[facility] EMERGENCY DEPARTMENT Provider Note   CSN: [id number] Arrival date & time: 08/22/19  1330     History Chief Complaint  Patient presents with  . Shortness of Breath  . Weakness    QUORRA ROSENE is a 79 y.o. female.  Patient is a 79 year old female with a history of prior breast cancer, hypertension, hyperlipidemia, coronary artery disease who presents with weakness.  She states she has had about a 2 to 3-week history of diarrhea.  She was seen in urgent care on May 28.  She had some blood work done there which showed an elevated creatinine and some mild anemia.  Also had a GI panel done that was negative.  I do not see a C. difficile test.  She had a negative Covid test.  She said her symptoms have actually improved.  She does not have any diarrhea.  She initially had some shortness of breath and fatigue when the symptoms had started but overall she is feeling better.  She still feels a little bit fatigued and has a little bit decreased appetite but overall is doing better.  She has noticed some muscle soreness and was seen by her PCP at [facility] who reportedly did some blood work and then referred her to rheumatologist who she saw today.  The patient states the rheumatologist checked a urinalysis and consulted with her primary care doctor and decided that she needed to come to the emergency room for IV fluids and admission to "flush her kidneys out".        Past Medical History:  Diagnosis Date  . Atherosclerosis of aorta ([place])    a. 01/2017/03/2017 - noted on high res chest CTs.  . Breast cancer ([place])    a. Bilateral --> s/p left mastectomy  . Carotid artery disease ([place])    a. 08/1441 w/ 15-40% LICA stenosis and <08% RICA stenosis; b. 10/2015 Carotid U/S: < 50% BICA stenosis  . Chest pain    a. 09/2011 MV: EF 68%, no ischemia/infarct.  . Chronic anemia   . Chronic headaches    denies  . Coronary artery calcification seen on CT  scan    a. 01/2017 High res CT: atherosclerotic calcification of the arterial vascularture, including severe involvement of the coronary arteries; b. 03/2017 CT Chest: coronary and Ao atheroscelrosis.  Marland Kitchen GERD (gastroesophageal reflux disease)   . History of echocardiogram    a. 09/2011 Echo: EF 55-60%, no rwma, triv AI, PASP 67mmHg.  Marland Kitchen Hyperlipidemia   . Hypertension   . Hyperthyroidism   . Left upper lobe pulmonary nodule    a. 02/2017 PET: slowing enlarging 1.7cm LUL nodule w/ low-grade metabolic activity; b. 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 05/2017 s/p VATS.  . Multinodular goiter    a. 02/2017 PET scan- Hypermetabolic nodule;  b. 07/930 s/p thyroidectomy  . Obesity   . OSA on CPAP    cpap  . Osteoarthritis   . Personal history of radiation therapy 1999  . Right bundle branch block   . Type II or unspecified type diabetes mellitus without mention of complication, uncontrolled     Patient Active Problem List   Diagnosis Date Noted  . Rhabdomyolysis 08/22/2019  . NSTEMI (non-ST elevated myocardial infarction) ([place]) 07/02/2019  . Dysphonia 05/21/2018  . Pelvic floor relaxation 10/02/2017  . Blood in urine 10/02/2017  . Left lower quadrant pain 10/02/2017  . Nausea 10/02/2017  . Nocturia 10/02/2017  . Osteopenia 10/02/2017  . Pain in  pelvis 10/02/2017  . Adenocarcinoma of left lung, stage 1 ([place]) 07/26/2017  . Iron deficiency anemia 07/26/2017  . S/P lobectomy of lung   . Long QT interval   . Sinus tachycardia   . Abnormal positron emission tomography (PET) scan 05/06/2017  . Pain of left shoulder joint on movement 04/09/2017  . Rib pain 04/09/2017  . BPPV (benign paroxysmal positional vertigo), left 03/27/2017  . Lipoma 04/06/2016  . Hypersomnia 01/18/2016  . Upper airway resistance syndrome 01/18/2016  . Hyperthyroidism 10/05/2011  . Chest pain at rest 10/04/2011  . Angina at rest [facility]) 10/04/2011  . Headache(784.0) 10/04/2011  . FH: MI (myocardial  infarction) 10/04/2011  . Chest pressure 10/04/2011  . Hyperlipidemia   . Hypertension   . OSA on CPAP   . Breast cancer ([place])   . Chronic anemia   . GERD (gastroesophageal reflux disease)   . Obesity   . Osteoarthritis   . Right bundle branch block   . Atherosclerosis of aorta ([place])   . Multiple thyroid nodules   . Type II or unspecified type diabetes mellitus without mention of complication, uncontrolled   . Chronic headaches   . OBESITY 11/17/2009  . OBSTRUCTIVE SLEEP APNEA 01/05/2009  . Type 2 diabetes mellitus with complication, without long-term current use of insulin ([place]) 11/12/2008  . HYPERLIPIDEMIA 11/12/2008  . Essential hypertension 11/12/2008  . Lung nodule 11/12/2008  . G E R D 11/12/2008  . UNSPECIFIED DISORDER OF KIDNEY AND URETER 11/12/2008  . OSTEOARTHRITIS 11/12/2008    Past Surgical History:  Procedure Laterality Date  . BREAST BIOPSY  1993; 1995; 2000   left; right; left  . BREAST EXCISIONAL BIOPSY Right pt unsure  . CORONARY STENT INTERVENTION N/A 07/03/2019   Procedure: CORONARY STENT INTERVENTION;  Surgeon: Troy Sine, MD;  Location: [place] CV LAB;  Service: Cardiovascular;  Laterality: N/A;  RCA  . LEFT HEART CATH AND CORONARY ANGIOGRAPHY N/A 07/03/2019   Procedure: LEFT HEART CATH AND CORONARY ANGIOGRAPHY;  Surgeon: Troy Sine, MD;  Location: [facility] CV [facility];  Service: Cardiovascular;  Laterality: N/A;  . MASTECTOMY Left 2000   left  . THYROIDECTOMY  05/10/2017   VIDEO BRONCHOSCOPY WITH ENDOBRONCHIAL NAVIGATION (N/A)  . THYROIDECTOMY N/A 05/10/2017   Procedure: TOTAL THYROIDECTOMY;  Surgeon: Armandina Gemma, MD;  Location: [facility];  Service: General;  Laterality: N/A;  . VIDEO ASSISTED THORACOSCOPY (VATS)/ LOBECTOMY Left 06/04/2017   Procedure: VIDEO ASSISTED THORACOSCOPY (VATS)/LEFT UPPER LOBECTOMY;  Surgeon: [name], MD;  Location: [facility];  Service: Thoracic;  Laterality: Left;  Marland Kitchen VIDEO BRONCHOSCOPY WITH ENDOBRONCHIAL  NAVIGATION N/A 05/10/2017   Procedure: VIDEO BRONCHOSCOPY WITH ENDOBRONCHIAL NAVIGATION;  Surgeon: [name], MD;  Location: [facility];  Service: Thoracic;  Laterality: N/A;  . VIDEO BRONCHOSCOPY WITH ENDOBRONCHIAL ULTRASOUND N/A 06/04/2017   Procedure: VIDEO BRONCHOSCOPY WITH ENDOBRONCHIAL ULTRASOUND;  Surgeon: [name], MD;  Location: [facility];  Service: Thoracic;  Laterality: N/A;     OB History   No obstetric history on file.     Family History  Problem Relation Age of Onset  . Diabetes Brother        x3  . Hypertension Brother        x3  . Diabetes Brother   . Stroke Brother   . Heart attack Mother 53       Mother Died of MI age 18  . Diabetes Sister   . Stroke Sister     Social History  Tobacco Use  . Smoking status: Never Smoker  . Smokeless tobacco: Never Used  Substance Use Topics  . Alcohol use: Yes    Comment: occ  . Drug use: No    Home Medications Prior to Admission medications   Medication Sig Start Date End Date Taking? Authorizing Provider  acetaminophen (TYLENOL) 500 MG tablet Take 1,000 mg by mouth every 6 (six) hours as needed for moderate pain or headache.    [provider]  amLODipine (NORVASC) 10 MG tablet Take 10 mg by mouth daily.    [provider]  aspirin 81 MG tablet Take 81 mg by mouth daily.      [provider]  Bacillus Coagulans-Inulin (PROBIOTIC) 1-250 BILLION-MG CAPS Take 1 capsule by mouth daily. 08/15/19   Wieters, Hallie C, PA-C  Cobalamin Combinations (B-12) [phone] MCG SUBL Place 1 tablet under the tongue daily.     [provider]  empagliflozin (JARDIANCE) 25 MG TABS tablet Take 25 mg by mouth daily.    [provider]  ezetimibe (ZETIA) 10 MG tablet Take 10 mg by mouth every evening.     [provider]  ferrous sulfate 325 (65 FE) MG tablet Take 325 mg by mouth 2 (two) times daily. 11/04/18   [provider]  hydrocortisone cream 1 % Apply 1  application topically 2 (two) times daily as needed for itching.     [provider]  levothyroxine (SYNTHROID, LEVOTHROID) 88 MCG tablet Take 88 mcg by mouth daily. 07/02/17   [provider]  linagliptin (TRADJENTA) 5 MG TABS tablet Take 5 mg by mouth daily.    [provider]  Magnesium Oxide 400 (240 Mg) MG TABS Take 400 mg by mouth 2 (two) times daily.     [provider]  metFORMIN (GLUCOPHAGE) 1000 MG tablet Take 2,000 mg by mouth daily.    [provider]  metoprolol tartrate (LOPRESSOR) 50 MG tablet Take 0.5 tablets (25 mg total) by mouth 2 (two) times daily. 08/19/19   Deberah Pelton, NP  NONFORMULARY OR COMPOUNDED ITEM [facility]:  Achilles Tendonitis #12 - Diclofenac 3%, Baclofen 2%, Bupivacaine 1%, Gabapentin 6%, Pentoxifylline 3%, Topiramate 1%. Apply 1-2 grams to affected area 3-4 times daily. 05/28/18   Wallene Huh, DPM  Omega-3 Fatty Acids (FISH OIL) 1200 MG CAPS Take 1,200 mg by mouth 2 (two) times daily.     [provider]  omeprazole (PRILOSEC) 20 MG capsule Take 20 mg by mouth 2 (two) times daily. Takes prn    [provider]  rosuvastatin (CRESTOR) 40 MG tablet Take 40 mg by mouth daily.    [provider]  ticagrelor (BRILINTA) 90 MG TABS tablet Take 1 tablet (90 mg total) by mouth 2 (two) times daily. 08/04/19   Deberah Pelton, NP    Allergies    Tramadol, Lisinopril, and Tapazole [methimazole]  Review of Systems   Review of Systems  Constitutional: Positive for appetite change and fatigue. Negative for chills, diaphoresis and fever.  HENT: Negative for congestion, rhinorrhea and sneezing.   Eyes: Negative.   Respiratory: Negative for cough, chest tightness and shortness of breath.   Cardiovascular: Negative for chest pain and leg swelling.  Gastrointestinal: Negative for abdominal pain, blood in stool, diarrhea, nausea and vomiting.  Genitourinary: Negative for difficulty urinating,  flank pain, frequency and hematuria.  Musculoskeletal: Negative for arthralgias and back pain.  Skin: Negative for rash.  Neurological: Negative for dizziness, speech difficulty, weakness, numbness  and headaches.    Physical Exam Updated Vital Signs BP 138/63   Pulse 76   Temp 98.2 F (36.8 C) (Oral)   Resp (!) 25   Ht 5\' 2"  (1.575 m)   Wt 64.4 kg   SpO2 99%   BMI 25.97 kg/m   Physical Exam Constitutional:      Appearance: She is well-developed.  HENT:     Head: Normocephalic and atraumatic.  Eyes:     Pupils: Pupils are equal, round, and reactive to light.  Cardiovascular:     Rate and Rhythm: Normal rate and regular rhythm.     Heart sounds: Normal heart sounds.  Pulmonary:     Effort: Pulmonary effort is normal. No respiratory distress.     Breath sounds: Normal breath sounds. No wheezing or rales.  Chest:     Chest wall: No tenderness.  Abdominal:     General: Bowel sounds are normal.     Palpations: Abdomen is soft.     Tenderness: There is no abdominal tenderness. There is no guarding or rebound.  Musculoskeletal:        General: Normal range of motion.     Cervical back: Normal range of motion and neck supple.  Lymphadenopathy:     Cervical: No cervical adenopathy.  Skin:    General: Skin is warm and dry.     Findings: No rash.  Neurological:     Mental Status: She is alert and oriented to person, place, and time.     ED Results / Procedures / Treatments   Labs (all labs ordered are listed, but only abnormal results are displayed) Labs Reviewed  BASIC METABOLIC PANEL - Abnormal; Notable for the following components:      Result Value   Glucose, Bld 268 (*)    BUN 38 (*)    Creatinine, Ser 1.88 (*)    Calcium 8.4 (*)    GFR calc non Af Amer 25 (*)    GFR calc Af Amer 30 (*)    All other components within normal limits  CBC - Abnormal; Notable for the following components:   RBC 3.42 (*)    Hemoglobin 9.5 (*)    HCT 29.9 (*)    All other  components within normal limits  URINALYSIS, ROUTINE W REFLEX MICROSCOPIC - Abnormal; Notable for the following components:   Color, Urine STRAW (*)    Glucose, UA >=500 (*)    Hgb urine dipstick LARGE (*)    Protein, ur 100 (*)    All other components within normal limits  HEPATIC FUNCTION PANEL - Abnormal; Notable for the following components:   Albumin 3.3 (*)    AST 289 (*)    ALT 525 (*)    All other components within normal limits  CK - Abnormal; Notable for the following components:   Total CK 5,585 (*)    All other components within normal limits    EKG EKG Interpretation  Date/Time:  Friday August 22 2019 13:39:31 EDT Ventricular Rate:  79 PR Interval:  118 QRS Duration: 112 QT Interval:  428 QTC Calculation: 490 R Axis:   -26 Text Interpretation: Normal sinus rhythm Incomplete right bundle branch block Prolonged QT Abnormal ECG since last tracing no significant change Confirmed by Malvin Johns [phone]) on 08/22/2019 8:06:28 PM   Radiology DG Chest 2 View  Result Date: 08/22/2019 CLINICAL DATA:  Shortness of breath EXAM: CHEST - 2 VIEW COMPARISON:  July 02, 2019 FINDINGS: There is scarring and  postoperative change on the left with volume loss, stable. Lungs otherwise are clear. The heart size and pulmonary vascularity are normal. There is calcification of the mitral annulus. No adenopathy. There is aortic atherosclerosis. Postoperative changes noted in the right breast region. Patient is status post left mastectomy. There is degenerative change in the thoracic spine. IMPRESSION: Postoperative changes with scarring and volume loss on the left. No edema or airspace opacity. Cardiac silhouette within normal limits. No adenopathy. Postoperative change at multiple sites. Aortic Atherosclerosis (ICD10-I70.0). Electronically Signed   By: Lowella Grip III M.D.   On: 08/22/2019 15:13    Procedures Procedures (including critical care time)  Medications Ordered in  ED Medications  sodium chloride 0.9 % bolus 500 mL (500 mLs Intravenous New Bag/Given 08/22/19 2152)    ED Course  I have reviewed the triage vital signs and the nursing notes.  Pertinent labs & imaging results that were available during my care of the patient were reviewed by me and considered in my medical decision making (see chart for details).  Clinical Course as of Aug 22 2242  Fri Aug 22, 2019  1813 Creatinine elevated but improved from 6 days ago   [JK]  1813 Anemia is stable  CBC(!) [JK]  1813 Chest x-ray without acute findings   [JK]    Clinical Course User Index [JK] Dorie Rank, MD   MDM Rules/Calculators/A&P                      Patient is a 79 year old female who presents from her primary care office needing IV fluids. She has had some generalized weakness and some muscle pains. She has had some elevation in her LFTs which is actually a little bit better than her prior labs. She has had some elevation in her creatinine. Today's value has improved from 6 days ago. Her chest x-ray is clear without acute abnormalities. Her hemoglobin is stable. Given her muscle pain, CK was checked which is elevated in the 5000 range. She was started on IV fluids. I spoke with the hospitalist, Dr. Myna Hidalgo, who will admit the patient for further treatment. Final Clinical Impression(s) / ED Diagnoses Final diagnoses:  Non-traumatic rhabdomyolysis    Rx / DC Orders ED Discharge Orders    None       Malvin Johns, MD 08/22/19 2250

## 2019-08-22 NOTE — ED Notes (Signed)
Anderson [facility], daughter, [phone] would like an update when available

## 2019-08-23 DIAGNOSIS — R7401 Elevation of levels of liver transaminase levels: Secondary | ICD-10-CM

## 2019-08-23 DIAGNOSIS — N179 Acute kidney failure, unspecified: Secondary | ICD-10-CM

## 2019-08-23 DIAGNOSIS — N1831 Chronic kidney disease, stage 3a: Secondary | ICD-10-CM

## 2019-08-23 LAB — COMPREHENSIVE METABOLIC PANEL
ALT: 456 U/L — ABNORMAL HIGH (ref 0–44)
AST: 215 U/L — ABNORMAL HIGH (ref 15–41)
Albumin: 3 g/dL — ABNORMAL LOW (ref 3.5–5.0)
Alkaline Phosphatase: 91 U/L (ref 38–126)
Anion gap: 13 (ref 5–15)
BUN: 35 mg/dL — ABNORMAL HIGH (ref 8–23)
CO2: 22 mmol/L (ref 22–32)
Calcium: 8.3 mg/dL — ABNORMAL LOW (ref 8.9–10.3)
Chloride: 105 mmol/L (ref 98–111)
Creatinine, Ser: 1.66 mg/dL — ABNORMAL HIGH (ref 0.44–1.00)
GFR calc Af Amer: 34 mL/min — ABNORMAL LOW (ref >60)
GFR calc non Af Amer: 30 mL/min — ABNORMAL LOW (ref >60)
Glucose, Bld: 179 mg/dL — ABNORMAL HIGH (ref 70–99)
Potassium: 3.5 mmol/L (ref 3.5–5.1)
Sodium: 140 mmol/L (ref 135–145)
Total Bilirubin: 0.7 mg/dL (ref 0.3–1.2)
Total Protein: 6.5 g/dL (ref 6.5–8.1)

## 2019-08-23 LAB — URIC ACID: Uric Acid, Serum: 5.1 mg/dL (ref 2.5–7.1)

## 2019-08-23 LAB — GLUCOSE, CAPILLARY
Glucose-Capillary: 139 mg/dL — ABNORMAL HIGH (ref 70–99)
Glucose-Capillary: 267 mg/dL — ABNORMAL HIGH (ref 70–99)
Glucose-Capillary: 262 mg/dL — ABNORMAL HIGH (ref 70–99)
Glucose-Capillary: 233 mg/dL — ABNORMAL HIGH (ref 70–99)

## 2019-08-23 LAB — CBC
HCT: 27.7 % — ABNORMAL LOW (ref 36.0–46.0)
Hemoglobin: 9 g/dL — ABNORMAL LOW (ref 12.0–15.0)
MCH: 28 pg (ref 26.0–34.0)
MCHC: 32.5 g/dL (ref 30.0–36.0)
MCV: 86.3 fL (ref 80.0–100.0)
Platelets: 292 10*3/uL (ref 150–400)
RBC: 3.21 MIL/uL — ABNORMAL LOW (ref 3.87–5.11)
RDW: 15.2 % (ref 11.5–15.5)
WBC: 7.5 10*3/uL (ref 4.0–10.5)
nRBC: 0 % (ref 0.0–0.2)

## 2019-08-23 LAB — HEPATITIS PANEL, ACUTE
HCV Ab: NONREACTIVE
Hep A IgM: NONREACTIVE
Hep B C IgM: NONREACTIVE
Hepatitis B Surface Ag: NONREACTIVE

## 2019-08-23 LAB — SARS CORONAVIRUS 2 BY RT PCR (HOSPITAL ORDER, PERFORMED IN ~~LOC~~ HOSPITAL LAB): SARS Coronavirus 2: NEGATIVE

## 2019-08-23 LAB — TYPE AND SCREEN
ABO/RH(D): B POS
Antibody Screen: NEGATIVE

## 2019-08-23 LAB — CK: Total CK: 3915 U/L — ABNORMAL HIGH (ref 38–234)

## 2019-08-23 MED ORDER — INSULIN ASPART 100 UNIT/ML ~~LOC~~ SOLN
0.0000 [IU] | Freq: Three times a day (TID) | SUBCUTANEOUS | Status: DC
  Administered 2019-08-23 (×2): 5 [IU] via SUBCUTANEOUS
  Administered 2019-08-23: 1 [IU] via SUBCUTANEOUS
  Administered 2019-08-24: 5 [IU] via SUBCUTANEOUS
  Administered 2019-08-24: 2 [IU] via SUBCUTANEOUS
  Administered 2019-08-25: 3 [IU] via SUBCUTANEOUS
  Administered 2019-08-25: 1 [IU] via SUBCUTANEOUS
  Administered 2019-08-25 – 2019-08-26 (×2): 3 [IU] via SUBCUTANEOUS
  Administered 2019-08-26: 2 [IU] via SUBCUTANEOUS

## 2019-08-23 MED ORDER — HEPARIN SODIUM (PORCINE) 5000 UNIT/ML IJ SOLN
5000.0000 [IU] | Freq: Three times a day (TID) | INTRAMUSCULAR | Status: DC
  Administered 2019-08-23 – 2019-08-26 (×10): 5000 [IU] via SUBCUTANEOUS
  Filled 2019-08-23 (×5): qty 1

## 2019-08-23 MED ORDER — SODIUM CHLORIDE 0.9 % IV SOLN: INTRAVENOUS | Status: AC

## 2019-08-23 MED ORDER — INSULIN ASPART 100 UNIT/ML ~~LOC~~ SOLN
0.0000 [IU] | Freq: Every day | SUBCUTANEOUS | Status: DC
  Administered 2019-08-23 – 2019-08-24 (×2): 2 [IU] via SUBCUTANEOUS

## 2019-08-23 NOTE — Progress Notes (Signed)
Spoke with pt about CPAP order. Per Pt she has not used CPAP in two years since thyroid surgery. I told pt to call if she needs any further assistance from RT.

## 2019-08-23 NOTE — Progress Notes (Signed)
Nutrition Brief Note  Patient identified on the Malnutrition Screening Tool (MST) Report  RD working remotely.  79 year old female with past medical history significant for CAD with DES placed in 06/2019, history of lung cancer s/p resection now under observation, chronic renal insufficiency, chronic anemia, DM2, OSA on CPAP presented with complaints of recent diarrhea now resolved, bilateral leg tenderness, and abnormal outpatient blood work and admitted for rhabdomyolysis.  Patient has been experiencing loose stools and nausea recently without fevers or abdominal pain, noted likely secondary to rhabdomyolysis. Diarrhea resolved prior to admission, patient po intakes improving, consuming 100% of the last 2 documented meals on HH/CM diet.   Current wt 144.76 lb Weights stable over the past 2 months, noted 156.42 in 01/2019 indicating a 11.66 lb (7.5%) wt loss over the past 6 months which is insignificant.  Wt Readings from Last 15 Encounters:  08/23/19 65.8 kg  08/19/19 64 kg  08/15/19 63.5 kg  07/30/19 65.3 kg  07/14/19 67.2 kg  07/05/19 63.5 kg  01/30/19 71.1 kg  07/29/18 69.1 kg  01/28/18 68.5 kg  01/01/18 68 kg  12/18/17 66.4 kg  12/04/17 66.6 kg  11/20/17 66.2 kg  10/30/17 65.8 kg  10/23/17 65.8 kg    Body mass index is 26.53 kg/m. Patient meets criteria for overweight based on current BMI.   Medications reviewed and include: SSI IVF: NaCl Labs: CBGs 267,139  No nutrition interventions warranted at this time. If nutrition issues arise, please consult RD.   Lajuan Lines, RD, LDN Clinical Nutrition After Hours/Weekend Pager # in [place]

## 2019-08-23 NOTE — Progress Notes (Signed)
PROGRESS NOTE    Karen West  BMW:[id number] DOB: 09-24-1940 DOA: 08/22/2019 PCP: Deland Pretty, MD    Brief Narrative:  79 y.o. female with medical history significant for coronary artery disease with DES placed in April 2021, history of lung cancer status post resection and now under observation, chronic renal insufficiency, chronic anemia, type 2 diabetes mellitus, and OSA on CPAP, now presenting to emergency department for evaluation of recent diarrhea, bilateral leg tenderness, and abnormal outpatient blood work.  Patient reports that her statin was doubled a couple months ago, she developed nonbloody diarrhea that has now resolved after stopping her magnesium supplement, but she has developed tenderness in the bilateral lower extremities over the past week or so without any trauma or inciting event.  She was seen at urgent care about a week ago when she was having diarrhea, had negative COVID-19 test and negative GI pathogen panel at that time but was noted to have elevated LFTs.  She reports follow-up blood work that included an elevated serum CK and increased creatinine, and she was directed to the ED for evaluation and management of this.  ED Course: Upon arrival to the ED, patient is found to be afebrile, saturating well on room air, and with stable blood pressure.  EKG features sinus rhythm with incomplete RBBB.  Chest x-ray notable for postoperative changes and scarring without edema or airspace opacity.  Chemistry panel notable for AST 284, ALT 525, normal bilirubin, and creatinine 1.88, improved from a week ago, but up from an apparent baseline of roughly 1.15.  CBC features a stable chronic normocytic anemia with hemoglobin 9.5.  Patient was given 500 cc of saline in the ED.    Assessment & Plan:   Principal Problem:   Rhabdomyolysis Active Problems:   Type 2 diabetes mellitus with complication, without long-term current use of insulin (HCC)   Essential hypertension   OSA on  CPAP   Chronic anemia   Adenocarcinoma of left lung, stage 1 (HCC)   Carotid artery disease (HCC)   Acute renal failure superimposed on stage 3 chronic kidney disease (HCC)   Elevated transaminase level   1. Rhabdomyolysis  - Presents for eval/mgmt of elevated CK and creatinine on outpatient blood work, reports that statin was increased in March, she had 2-3 wks of diarrhea that is now resolved, and now has 1 wk of bilateral leg tenderness  - Presenting CK is 5585, urine is clear with "large Hgb" but 0-5 rbc/hpf  - Legs initially noted to be tender but improved today, still tender over R knee -CK down to 3915 - Continue IVF hydration as tolerated, hold statin, monitor renal function and CK trends  2. Acute kidney injury superimposed on CKD IIIa  - Presenting SCr is 1.88 in ED, down from 2.24 on 5/29, but up from 1.58 on 07/28/19 and 1.15 in April 2021  - Baseline appears to be ~1.15  - Prerenal azotemia in setting of diarrhea vs heme pigment-induced injury  - Continued on IVF hydration. Cr improved to 1.66 -Repeat bmet in AM  3. Elevated transaminases  - Presenting AST is 284 and ALT 525 in ED with normal alk phos, normal bili, and benign abdominal exam  - She had been experiencing loose stools and nausea recently without fevers or abdominal pain  - This is likely secondary to rhabdomyolysis. Viral hepatitis panel is neg -Repeat lft in AM  4. CAD - No anginal complaints  - She had NSTEMI in April with DES placed  07/03/19  - Continue DAPT and beta-blocker, hold statin for now as above   -Would defer to outpt setting regarding when to resume and if so, likely back to lower dose which pt had previously tolerated -LDL from 07/03/19 of 61  5. Anemia  - Hgb is 9.5 on admission, similar to priors  - Patient denies melena, hematochezia, or other bleeding  - Anticipate dilutional drop, type and screen, monitor   -Recheck cbc in AM  6. Type II DM  - A1c was 7.3% in April 2021  -  Check CBGs and use low-intensity SSI for now   DVT prophylaxis: heparin subq Code Status: Full Family Communication: Pt in room, family not at bedside  Status is: Inpatient  Remains inpatient appropriate because:Persistent severe electrolyte disturbances   Dispo: The patient is from: Home              Anticipated d/c is to: Home              Anticipated d/c date is: 2 days              Patient currently is not medically stable to d/c.       Consultants:     Procedures:     Antimicrobials: Anti-infectives (From admission, onward)   None       Subjective: States muscle aches are better, still hurting around the R knee area  Objective: Vitals:   08/23/19 0135 08/23/19 0423 08/23/19 0825 08/23/19 1235  BP: 129/60 (!) 126/54 (!) 152/63 (!) 108/46  Pulse: 69 71 85 70  Resp: _0 Temp: 98.4 F (36.9 C) 98.1 F (36.7 C)  99.4 F (37.4 C)  TempSrc: Oral Oral  Oral  SpO2: 99% 99% 100% 99%  Weight: 65.8 kg     Height:        Intake/Output Summary (Last 24 hours) at 08/23/2019 1459 Last data filed at 08/23/2019 1248 Gross per 24 hour  Intake 1622.96 ml  Output 550 ml  Net 1072.96 ml   Filed Weights   08/22/19 1335 08/23/19 0135  Weight: 64.4 kg 65.8 kg    Examination:  General exam: Appears calm and comfortable  Respiratory system: Clear to auscultation. Respiratory effort normal. Cardiovascular system: S1 & S2 heard, Regular Gastrointestinal system: Abdomen is nondistended, soft and nontender. No organomegaly or masses felt. Normal bowel sounds heard. Central nervous system: Alert and oriented. No focal neurological deficits. Extremities: Symmetric 5 x 5 power. Skin: No rashes, lesions Psychiatry: Judgement and insight appear normal. Mood & affect appropriate.   Data Reviewed: I have personally reviewed following labs and imaging studies  CBC: Recent Labs  Lab 08/22/19 1406 08/23/19 0313  WBC 9.4 7.5  HGB 9.5* 9.0*  HCT 29.9* 27.7*  MCV  87.4 86.3  PLT 333 446   Basic Metabolic Panel: Recent Labs  Lab 08/22/19 1406 08/23/19 0313  NA 138 140  K 4.0 3.5  CL 103 105  CO2 22 22  GLUCOSE 268* 179*  BUN 38* 35*  CREATININE 1.88* 1.66*  CALCIUM 8.4* 8.3*   GFR: Estimated Creatinine Clearance: 25.7 mL/min (A) (by C-G formula based on SCr of 1.66 mg/dL (H)). Liver Function Tests: Recent Labs  Lab 08/22/19 1419 08/23/19 0313  AST 289* 215*  ALT 525* 456*  ALKPHOS 97 91  BILITOT 0.6 0.7  PROT 7.2 6.5  ALBUMIN 3.3* 3.0*   No results for input(s): LIPASE, AMYLASE in the last 168 hours. No results for input(s): AMMONIA  in the last 168 hours. Coagulation Profile: No results for input(s): INR, PROTIME in the last 168 hours. Cardiac Enzymes: Recent Labs  Lab 08/22/19 1419 08/23/19 0313  CKTOTAL 5,585* 3,915*   BNP (last 3 results) No results for input(s): PROBNP in the last 8760 hours. HbA1C: No results for input(s): HGBA1C in the last 72 hours. CBG: Recent Labs  Lab 08/23/19 0633 08/23/19 1141  GLUCAP 139* 267*   Lipid Profile: No results for input(s): CHOL, HDL, LDLCALC, TRIG, CHOLHDL, LDLDIRECT in the last 72 hours. Thyroid Function Tests: No results for input(s): TSH, T4TOTAL, FREET4, T3FREE, THYROIDAB in the last 72 hours. Anemia Panel: No results for input(s): VITAMINB12, FOLATE, FERRITIN, TIBC, IRON, RETICCTPCT in the last 72 hours. Sepsis Labs: No results for input(s): PROCALCITON, LATICACIDVEN in the last 168 hours.  Recent Results (from the past 240 hour(s))  SARS CORONAVIRUS 2 (TAT 6-24 HRS) Nasopharyngeal Nasopharyngeal Swab     Status: None   Collection Time: 08/15/19  5:23 PM   Specimen: Nasopharyngeal Swab  Result Value Ref Range Status   SARS Coronavirus 2 NEGATIVE NEGATIVE Final    Comment: (NOTE) SARS-CoV-2 target nucleic acids are NOT DETECTED. The SARS-CoV-2 RNA is generally detectable in upper and lower respiratory specimens during the acute phase of infection.  Negative results do not preclude SARS-CoV-2 infection, do not rule out co-infections with other pathogens, and should not be used as the sole basis for treatment or other patient management decisions. Negative results must be combined with clinical observations, patient history, and epidemiological information. The expected result is Negative. Fact Sheet for Patients: SugarRoll.be Fact Sheet for Healthcare Providers: https://www.woods-mathews.com/ This test is not yet approved or cleared by the [place] FDA and  has been authorized for detection and/or diagnosis of SARS-CoV-2 by FDA under an Emergency Use Authorization (EUA). This EUA will remain  in effect (meaning this test can be used) for the duration of the COVID-19 declaration under Section 56 4(b)(1) of the Act, 21 U.S.C. section 360bbb-3(b)(1), unless the authorization is terminated or revoked sooner. Performed at [facility], [place] [address], [place], [place] [address]   Gastrointestinal Panel by PCR , Stool     Status: None   Collection Time: 08/15/19  7:18 PM   Specimen: Stool  Result Value Ref Range Status   Campylobacter species NOT DETECTED NOT DETECTED Final   Plesimonas shigelloides NOT DETECTED NOT DETECTED Final   Salmonella species NOT DETECTED NOT DETECTED Final   Yersinia enterocolitica NOT DETECTED NOT DETECTED Final   Vibrio species NOT DETECTED NOT DETECTED Final   Vibrio cholerae NOT DETECTED NOT DETECTED Final   Enteroaggregative E coli (EAEC) NOT DETECTED NOT DETECTED Final   Enteropathogenic E coli (EPEC) NOT DETECTED NOT DETECTED Final   Enterotoxigenic E coli (ETEC) NOT DETECTED NOT DETECTED Final   Shiga like toxin producing E coli (STEC) NOT DETECTED NOT DETECTED Final   Shigella/Enteroinvasive E coli (EIEC) NOT DETECTED NOT DETECTED Final   Cryptosporidium NOT DETECTED NOT DETECTED Final   Cyclospora cayetanensis NOT DETECTED NOT DETECTED Final    Entamoeba histolytica NOT DETECTED NOT DETECTED Final   Giardia lamblia NOT DETECTED NOT DETECTED Final   Adenovirus F40/41 NOT DETECTED NOT DETECTED Final   Astrovirus NOT DETECTED NOT DETECTED Final   Norovirus GI/GII NOT DETECTED NOT DETECTED Final   Rotavirus A NOT DETECTED NOT DETECTED Final   Sapovirus (I, II, IV, and V) NOT DETECTED NOT DETECTED Final    Comment: Performed at [facility], [place], [place] [address]  SARS Coronavirus 2 by RT PCR (hospital order, performed in [facility] hospital lab) Nasopharyngeal Nasopharyngeal Swab     Status: None   Collection Time: 08/22/19 11:38 PM   Specimen: Nasopharyngeal Swab  Result Value Ref Range Status   SARS Coronavirus 2 NEGATIVE NEGATIVE Final    Comment: (NOTE) SARS-CoV-2 target nucleic acids are NOT DETECTED. The SARS-CoV-2 RNA is generally detectable in upper and lower respiratory specimens during the acute phase of infection. The lowest concentration of SARS-CoV-2 viral copies this assay can detect is 250 copies / mL. A negative result does not preclude SARS-CoV-2 infection and should not be used as the sole basis for treatment or other patient management decisions.  A negative result may occur with improper specimen collection / handling, submission of specimen other than nasopharyngeal swab, presence of viral mutation(s) within the areas targeted by this assay, and inadequate number of viral copies (<250 copies / mL). A negative result must be combined with clinical observations, patient history, and epidemiological information. Fact Sheet for Patients:   StrictlyIdeas.no Fact Sheet for Healthcare Providers: BankingDealers.co.za This test is not yet approved or cleared  by the [place] FDA and has been authorized for detection and/or diagnosis of SARS-CoV-2 by FDA under an Emergency Use Authorization (EUA).  This EUA will remain in effect  (meaning this test can be used) for the duration of the COVID-19 declaration under Section 564(b)(1) of the Act, 21 U.S.C. section 360bbb-3(b)(1), unless the authorization is terminated or revoked sooner. Performed at [facility], [place] [address], [place], [place] [address]      Radiology Studies: DG Chest 2 View  Result Date: 08/22/2019 CLINICAL DATA:  Shortness of breath EXAM: CHEST - 2 VIEW COMPARISON:  July 02, 2019 FINDINGS: There is scarring and postoperative change on the left with volume loss, stable. Lungs otherwise are clear. The heart size and pulmonary vascularity are normal. There is calcification of the mitral annulus. No adenopathy. There is aortic atherosclerosis. Postoperative changes noted in the right breast region. Patient is status post left mastectomy. There is degenerative change in the thoracic spine. IMPRESSION: Postoperative changes with scarring and volume loss on the left. No edema or airspace opacity. Cardiac silhouette within normal limits. No adenopathy. Postoperative change at multiple sites. Aortic Atherosclerosis (ICD10-I70.0). Electronically Signed   By: Lowella Grip III M.D.   On: 08/22/2019 15:13    Scheduled Meds: . aspirin  81 mg Oral Daily  . heparin  5,000 Units Subcutaneous Q8H  . insulin aspart  0-5 Units Subcutaneous QHS  . insulin aspart  0-9 Units Subcutaneous TID WC  . metoprolol tartrate  25 mg Oral BID  . ticagrelor  90 mg Oral BID   Continuous Infusions: . sodium chloride 100 mL/hr at 08/23/19 1240     LOS: 1 day   Marylu Lund, MD [facility] Pager On Amion  If 7PM-7AM, please contact night-coverage 08/23/2019, 2:59 PM

## 2019-08-24 DIAGNOSIS — D649 Anemia, unspecified: Secondary | ICD-10-CM

## 2019-08-24 DIAGNOSIS — C3492 Malignant neoplasm of unspecified part of left bronchus or lung: Secondary | ICD-10-CM

## 2019-08-24 LAB — GLUCOSE, CAPILLARY
Glucose-Capillary: 119 mg/dL — ABNORMAL HIGH (ref 70–99)
Glucose-Capillary: 193 mg/dL — ABNORMAL HIGH (ref 70–99)
Glucose-Capillary: 266 mg/dL — ABNORMAL HIGH (ref 70–99)
Glucose-Capillary: 208 mg/dL — ABNORMAL HIGH (ref 70–99)

## 2019-08-24 LAB — CBC
HCT: 26.5 % — ABNORMAL LOW (ref 36.0–46.0)
Hemoglobin: 8.6 g/dL — ABNORMAL LOW (ref 12.0–15.0)
MCH: 28.2 pg (ref 26.0–34.0)
MCHC: 32.5 g/dL (ref 30.0–36.0)
MCV: 86.9 fL (ref 80.0–100.0)
Platelets: 308 10*3/uL (ref 150–400)
RBC: 3.05 MIL/uL — ABNORMAL LOW (ref 3.87–5.11)
RDW: 15.6 % — ABNORMAL HIGH (ref 11.5–15.5)
WBC: 6.9 10*3/uL (ref 4.0–10.5)
nRBC: 0 % (ref 0.0–0.2)

## 2019-08-24 LAB — COMPREHENSIVE METABOLIC PANEL
ALT: 300 U/L — ABNORMAL HIGH (ref 0–44)
AST: 134 U/L — ABNORMAL HIGH (ref 15–41)
Albumin: 2.7 g/dL — ABNORMAL LOW (ref 3.5–5.0)
Alkaline Phosphatase: 75 U/L (ref 38–126)
Anion gap: 13 (ref 5–15)
BUN: 23 mg/dL (ref 8–23)
CO2: 23 mmol/L (ref 22–32)
Calcium: 8.3 mg/dL — ABNORMAL LOW (ref 8.9–10.3)
Chloride: 107 mmol/L (ref 98–111)
Creatinine, Ser: 1.46 mg/dL — ABNORMAL HIGH (ref 0.44–1.00)
GFR calc Af Amer: 40 mL/min — ABNORMAL LOW (ref >60)
GFR calc non Af Amer: 35 mL/min — ABNORMAL LOW (ref >60)
Glucose, Bld: 145 mg/dL — ABNORMAL HIGH (ref 70–99)
Potassium: 3.7 mmol/L (ref 3.5–5.1)
Sodium: 143 mmol/L (ref 135–145)
Total Bilirubin: 0.3 mg/dL (ref 0.3–1.2)
Total Protein: 6 g/dL — ABNORMAL LOW (ref 6.5–8.1)

## 2019-08-24 LAB — CK: Total CK: 2203 U/L — ABNORMAL HIGH (ref 38–234)

## 2019-08-24 MED ORDER — LACTATED RINGERS IV SOLN: INTRAVENOUS | Status: DC

## 2019-08-24 NOTE — Progress Notes (Signed)
PROGRESS NOTE    Karen West  BMW:[id number] DOB: [birth year] DOA: 08/22/2019 PCP: Deland Pretty, MD    Brief Narrative:  79 y.o. female with medical history significant for coronary artery disease with DES placed in April 2021, history of lung cancer status post resection and now under observation, chronic renal insufficiency, chronic anemia, type 2 diabetes mellitus, and OSA on CPAP, now presenting to emergency department for evaluation of recent diarrhea, bilateral leg tenderness, and abnormal outpatient blood work.  Patient reports that her statin was doubled a couple months ago, she developed nonbloody diarrhea that has now resolved after stopping her magnesium supplement, but she has developed tenderness in the bilateral lower extremities over the past week or so without any trauma or inciting event.  She was seen at urgent care about a week ago when she was having diarrhea, had negative COVID-19 test and negative GI pathogen panel at that time but was noted to have elevated LFTs.  She reports follow-up blood work that included an elevated serum CK and increased creatinine, and she was directed to the ED for evaluation and management of this.  ED Course: Upon arrival to the ED, patient is found to be afebrile, saturating well on room air, and with stable blood pressure.  EKG features sinus rhythm with incomplete RBBB.  Chest x-ray notable for postoperative changes and scarring without edema or airspace opacity.  Chemistry panel notable for AST 284, ALT 525, normal bilirubin, and creatinine 1.88, improved from a week ago, but up from an apparent baseline of roughly 1.15.  CBC features a stable chronic normocytic anemia with hemoglobin 9.5.  Patient was given 500 cc of saline in the ED.    Assessment & Plan:   Principal Problem:   Rhabdomyolysis Active Problems:   Type 2 diabetes mellitus with complication, without long-term current use of insulin (HCC)   Essential hypertension   OSA on  CPAP   Chronic anemia   Adenocarcinoma of left lung, stage 1 (HCC)   Carotid artery disease (HCC)   Acute renal failure superimposed on stage 3 chronic kidney disease (HCC)   Elevated transaminase level   1. Rhabdomyolysis  - Presents for eval/mgmt of elevated CK and creatinine on outpatient blood work, reports that statin was increased in March, she had 2-3 wks of diarrhea that is now resolved, and now has 1 wk of bilateral leg tenderness  - Presenting CK is 5585, urine is clear with "large Hgb" but 0-5 rbc/hpf  - Legs initially noted to be tender but improved today, still tender over R knee -CK remains elevated but is down to 2203 - Continue IVF hydration as tolerated, hold statin, monitor renal function and CK trends in AM -PT consulted  2. Acute kidney injury superimposed on CKD IIIa  - Presenting SCr is 1.88 in ED, down from 2.24 on 5/29, but up from 1.58 on 07/28/19 and 1.15 in April 2021  - Baseline appears to be ~1.15  - Prerenal azotemia in setting of diarrhea vs heme pigment-induced injury  - Continued on IVF hydration. Cr improved to 1.46 today -Repeat bmet in AM  3. Elevated transaminases  - Presenting AST is 284 and ALT 525 in ED with normal alk phos, normal bili, and benign abdominal exam  - She had been experiencing loose stools and nausea recently without fevers or abdominal pain  - This is likely secondary to rhabdomyolysis. Viral hepatitis panel is neg -LFT trending down. Repeat LFT in AM  4. CAD - No  anginal complaints  - She had NSTEMI in April with DES placed 07/03/19  - Continue DAPT and beta-blocker, hold statin for now as above   -Would defer to outpt setting regarding when to resume and if so, likely back to lower dose which pt had previously tolerated -LDL from 07/03/19 of 61  5. Anemia  - Hgb is 9.5 on admission, similar to priors  - Patient denies melena, hematochezia, or other bleeding  - Anticipate dilutional drop, type and screen, monitor     -Cont to follow  6. Type II DM  - A1c was 7.3% in April 2021  - continue with low-intensity SSI for now   DVT prophylaxis: heparin subq Code Status: Full Family Communication: Pt in room, family not at bedside  Status is: Inpatient  Remains inpatient appropriate because:Persistent severe electrolyte disturbances   Dispo: The patient is from: Home              Anticipated d/c is to: Home              Anticipated d/c date is: 2 days              Patient currently is not medically stable to d/c.   Consultants:     Procedures:     Antimicrobials: Anti-infectives (From admission, onward)   None      Subjective: Feeling better. Without complaints  Objective: Vitals:   08/23/19 1644 08/23/19 2025 08/24/19 0443 08/24/19 0901  BP: (!) 138/53 (!) 120/54 (!) 151/73 (!) 141/60  Pulse: 71 78 78 71  Resp: '16 18 16 18  '$ Temp: 98.6 F (37 C) 98.2 F (36.8 C) 98.6 F (37 C) 98 F (36.7 C)  TempSrc:  Oral Oral   SpO2: 100% 100% 99% 100%  Weight:  67.5 kg    Height:        Intake/Output Summary (Last 24 hours) at 08/24/2019 1503 Last data filed at 08/24/2019 1308 Gross per 24 hour  Intake 1560 ml  Output 0 ml  Net 1560 ml   Filed Weights   08/22/19 1335 08/23/19 0135 08/23/19 2025  Weight: 64.4 kg 65.8 kg 67.5 kg    Examination: General exam: Awake, laying in bed, in nad Respiratory system: Normal respiratory effort, no wheezing Cardiovascular system: regular rate, s1, s2 Gastrointestinal system: Soft, nondistended, positive BS Central nervous system: CN2-12 grossly intact, strength intact Extremities: Perfused, no clubbing Skin: Normal skin turgor, no notable skin lesions seen Psychiatry: Mood normal // no visual hallucinations   Data Reviewed: I have personally reviewed following labs and imaging studies  CBC: Recent Labs  Lab 08/22/19 1406 08/23/19 0313 08/24/19 0627  WBC 9.4 7.5 6.9  HGB 9.5* 9.0* 8.6*  HCT 29.9* 27.7* 26.5*  MCV 87.4 86.3 86.9   PLT 333 292 017   Basic Metabolic Panel: Recent Labs  Lab 08/22/19 1406 08/23/19 0313 08/24/19 0627  NA 138 140 143  K 4.0 3.5 3.7  CL 103 105 107  CO2 '22 22 23  '$ GLUCOSE 268* 179* 145*  BUN 38* 35* 23  CREATININE 1.88* 1.66* 1.46*  CALCIUM 8.4* 8.3* 8.3*   GFR: Estimated Creatinine Clearance: 29.5 mL/min (A) (by C-G formula based on SCr of 1.46 mg/dL (H)). Liver Function Tests: Recent Labs  Lab 08/22/19 1419 08/23/19 0313 08/24/19 0627  AST 289* 215* 134*  ALT 525* 456* 300*  ALKPHOS 97 91 75  BILITOT 0.6 0.7 0.3  PROT 7.2 6.5 6.0*  ALBUMIN 3.3* 3.0* 2.7*   No  results for input(s): LIPASE, AMYLASE in the last 168 hours. No results for input(s): AMMONIA in the last 168 hours. Coagulation Profile: No results for input(s): INR, PROTIME in the last 168 hours. Cardiac Enzymes: Recent Labs  Lab 08/22/19 1419 08/23/19 0313 08/24/19 0627  CKTOTAL 5,585* 3,915* 2,203*   BNP (last 3 results) No results for input(s): PROBNP in the last 8760 hours. HbA1C: No results for input(s): HGBA1C in the last 72 hours. CBG: Recent Labs  Lab 08/23/19 1141 08/23/19 1637 08/23/19 2027 08/24/19 0646 08/24/19 1115  GLUCAP 267* 262* 233* 119* 193*   Lipid Profile: No results for input(s): CHOL, HDL, LDLCALC, TRIG, CHOLHDL, LDLDIRECT in the last 72 hours. Thyroid Function Tests: No results for input(s): TSH, T4TOTAL, FREET4, T3FREE, THYROIDAB in the last 72 hours. Anemia Panel: No results for input(s): VITAMINB12, FOLATE, FERRITIN, TIBC, IRON, RETICCTPCT in the last 72 hours. Sepsis Labs: No results for input(s): PROCALCITON, LATICACIDVEN in the last 168 hours.  Recent Results (from the past 240 hour(s))  SARS CORONAVIRUS 2 (TAT 6-24 HRS) Nasopharyngeal Nasopharyngeal Swab     Status: None   Collection Time: 08/15/19  5:23 PM   Specimen: Nasopharyngeal Swab  Result Value Ref Range Status   SARS Coronavirus 2 NEGATIVE NEGATIVE Final    Comment: (NOTE) SARS-CoV-2 target  nucleic acids are NOT DETECTED. The SARS-CoV-2 RNA is generally detectable in upper and lower respiratory specimens during the acute phase of infection. Negative results do not preclude SARS-CoV-2 infection, do not rule out co-infections with other pathogens, and should not be used as the sole basis for treatment or other patient management decisions. Negative results must be combined with clinical observations, patient history, and epidemiological information. The expected result is Negative. Fact Sheet for Patients: SugarRoll.be Fact Sheet for Healthcare Providers: https://www.woods-mathews.com/ This test is not yet approved or cleared by the [place] FDA and  has been authorized for detection and/or diagnosis of SARS-CoV-2 by FDA under an Emergency Use Authorization (EUA). This EUA will remain  in effect (meaning this test can be used) for the duration of the COVID-19 declaration under Section 56 4(b)(1) of the Act, 21 U.S.C. section 360bbb-3(b)(1), unless the authorization is terminated or revoked sooner. Performed at [facility], [place] [address], [place], [place] [address]   Gastrointestinal Panel by PCR , Stool     Status: None   Collection Time: 08/15/19  7:18 PM   Specimen: Stool  Result Value Ref Range Status   Campylobacter species NOT DETECTED NOT DETECTED Final   Plesimonas shigelloides NOT DETECTED NOT DETECTED Final   Salmonella species NOT DETECTED NOT DETECTED Final   Yersinia enterocolitica NOT DETECTED NOT DETECTED Final   Vibrio species NOT DETECTED NOT DETECTED Final   Vibrio cholerae NOT DETECTED NOT DETECTED Final   Enteroaggregative E coli (EAEC) NOT DETECTED NOT DETECTED Final   Enteropathogenic E coli (EPEC) NOT DETECTED NOT DETECTED Final   Enterotoxigenic E coli (ETEC) NOT DETECTED NOT DETECTED Final   Shiga like toxin producing E coli (STEC) NOT DETECTED NOT DETECTED Final   Shigella/Enteroinvasive E  coli (EIEC) NOT DETECTED NOT DETECTED Final   Cryptosporidium NOT DETECTED NOT DETECTED Final   Cyclospora cayetanensis NOT DETECTED NOT DETECTED Final   Entamoeba histolytica NOT DETECTED NOT DETECTED Final   Giardia lamblia NOT DETECTED NOT DETECTED Final   Adenovirus F40/41 NOT DETECTED NOT DETECTED Final   Astrovirus NOT DETECTED NOT DETECTED Final   Norovirus GI/GII NOT DETECTED NOT DETECTED Final   Rotavirus  A NOT DETECTED NOT DETECTED Final   Sapovirus (I, II, IV, and V) NOT DETECTED NOT DETECTED Final    Comment: Performed at [facility], [address] [place], [place] [address]  SARS Coronavirus 2 by RT PCR (hospital order, performed in [facility] hospital lab) Nasopharyngeal Nasopharyngeal Swab     Status: None   Collection Time: 08/22/19 11:38 PM   Specimen: Nasopharyngeal Swab  Result Value Ref Range Status   SARS Coronavirus 2 NEGATIVE NEGATIVE Final    Comment: (NOTE) SARS-CoV-2 target nucleic acids are NOT DETECTED. The SARS-CoV-2 RNA is generally detectable in upper and lower respiratory specimens during the acute phase of infection. The lowest concentration of SARS-CoV-2 viral copies this assay can detect is 250 copies / mL. A negative result does not preclude SARS-CoV-2 infection and should not be used as the sole basis for treatment or other patient management decisions.  A negative result may occur with improper specimen collection / handling, submission of specimen other than nasopharyngeal swab, presence of viral mutation(s) within the areas targeted by this assay, and inadequate number of viral copies (<250 copies / mL). A negative result must be combined with clinical observations, patient history, and epidemiological information. Fact Sheet for Patients:   StrictlyIdeas.no Fact Sheet for Healthcare Providers: BankingDealers.co.za This test is not yet approved or cleared  by the [place] FDA  and has been authorized for detection and/or diagnosis of SARS-CoV-2 by FDA under an Emergency Use Authorization (EUA).  This EUA will remain in effect (meaning this test can be used) for the duration of the COVID-19 declaration under Section 564(b)(1) of the Act, 21 U.S.C. section 360bbb-3(b)(1), unless the authorization is terminated or revoked sooner. Performed at [facility], [place] [address], [place], [place] [address]      Radiology Studies: No results found.  Scheduled Meds: . aspirin  81 mg Oral Daily  . heparin  5,000 Units Subcutaneous Q8H  . insulin aspart  0-5 Units Subcutaneous QHS  . insulin aspart  0-9 Units Subcutaneous TID WC  . metoprolol tartrate  25 mg Oral BID  . ticagrelor  90 mg Oral BID   Continuous Infusions: . lactated ringers 100 mL/hr at 08/24/19 1216     LOS: 2 days   Marylu Lund, MD [facility] Pager On Amion  If 7PM-7AM, please contact night-coverage 08/24/2019, 3:03 PM

## 2019-08-24 NOTE — Evaluation (Signed)
Physical Therapy Evaluation Patient Details Name: Karen West MRN: [id number] DOB: Jan 26, 1941 Today's Date: 08/24/2019   History of Present Illness  Karen West is a 79 y.o. female with medical history significant for coronary artery disease with DES placed in April 2021, history of lung cancer status post resection and now under observation, chronic renal insufficiency, chronic anemia, type 2 diabetes mellitus, and OSA on CPAP, now presenting to emergency department for evaluation of recent diarrhea, bilateral leg tenderness, and abnormal outpatient blood work.  Patient reports that her statin was doubled a couple months ago, she developed nonbloody diarrhea that has now resolved after stopping her magnesium supplement, but she has developed tenderness in the bilateral lower extremities over the past week or so without any trauma or inciting event. diagnosed with Rhabdomyolysis  Clinical Impression  Pt admitted with above diagnosis. Comes from home where she lives indpendently in a single level home with one steps to enter; Reports she has family local who can assist her some; presents to PT with decr endurance, and notable DOE 2/4 with amb;  Pt currently with functional limitations due to the deficits listed below (see PT Problem List). Pt will benefit from skilled PT to increase their independence and safety with mobility to allow discharge to the venue listed below.       Follow Up Recommendations No PT follow up    Equipment Recommendations  Rolling walker with 5" wheels    Recommendations for Other Services       Precautions / Restrictions Precautions Precautions: Fall      Mobility  Bed Mobility Overal bed mobility: Modified Independent                Transfers Overall transfer level: Modified independent               General transfer comment: Incr use of UEs to steady  Ambulation/Gait Ambulation/Gait assistance: Min guard Gait Distance (Feet): 60  Feet Assistive device: IV Pole Gait Pattern/deviations: Step-through pattern     General Gait Details: Generally slow, but steady steps, and able to maneuver IV pole around objects  Stairs            Wheelchair Mobility    Modified Rankin (Stroke Patients Only)       Balance Overall balance assessment: Mild deficits observed, not formally tested                                           Pertinent Vitals/Pain Pain Assessment: Faces Faces Pain Scale: Hurts a little bit Pain Location: bil LEs Pain Descriptors / Indicators: Grimacing Pain Intervention(s): Monitored during session    Home Living Family/patient expects to be discharged to:: Private residence Living Arrangements: Alone Available Help at Discharge: Family;Available PRN/intermittently Type of Home: House Home Access: Stairs to enter Entrance Stairs-Rails: None Entrance Stairs-Number of Steps: 1 Home Layout: One level Home Equipment: Walker - 2 wheels;Bedside commode      Prior Function Level of Independence: Independent               Hand Dominance        Extremity/Trunk Assessment   Upper Extremity Assessment Upper Extremity Assessment: Overall WFL for tasks assessed    Lower Extremity Assessment Lower Extremity Assessment: Generalized weakness       Communication   Communication: No difficulties  Cognition Arousal/Alertness: Awake/alert Behavior During Therapy:  WFL for tasks assessed/performed Overall Cognitive Status: Within Functional Limits for tasks assessed                                        General Comments      Exercises     Assessment/Plan    PT Assessment Patient needs continued PT services  PT Problem List Decreased strength;Decreased activity tolerance;Decreased balance;Decreased mobility;Decreased knowledge of use of DME       PT Treatment Interventions DME instruction;Gait training;Stair training;Functional mobility  training;Therapeutic activities;Therapeutic exercise;Patient/family education    PT Goals (Current goals can be found in the Care Plan section)  Acute Rehab PT Goals Patient Stated Goal: Hopes to be home soon PT Goal Formulation: With patient Time For Goal Achievement: 09/07/19 Potential to Achieve Goals: Good    Frequency Min 3X/week   Barriers to discharge        Co-evaluation               AM-PAC PT "6 Clicks" Mobility  Outcome Measure Help needed turning from your back to your side while in a flat bed without using bedrails?: None Help needed moving from lying on your back to sitting on the side of a flat bed without using bedrails?: None Help needed moving to and from a bed to a chair (including a wheelchair)?: None Help needed standing up from a chair using your arms (e.g., wheelchair or bedside chair)?: None Help needed to walk in hospital room?: A Little Help needed climbing 3-5 steps with a railing? : A Little 6 Click Score: 22    End of Session Equipment Utilized During Treatment: Gait belt Activity Tolerance: Patient tolerated treatment well Patient left: in chair;with call bell/phone within reach Nurse Communication: Mobility status PT Visit Diagnosis: Other abnormalities of gait and mobility (R26.89);Muscle weakness (generalized) (M62.81)    Time: 5974-1638 PT Time Calculation (min) (ACUTE ONLY): 25 min   Charges:   PT Evaluation $PT Eval Low Complexity: 1 Low PT Treatments $Gait Training: 8-22 mins        Roney Marion, PT  Acute Rehabilitation Services Pager [phone] Office [place] 08/24/2019, 4:55 PM

## 2019-08-25 LAB — CBC
HCT: 28.1 % — ABNORMAL LOW (ref 36.0–46.0)
Hemoglobin: 8.9 g/dL — ABNORMAL LOW (ref 12.0–15.0)
MCH: 27.9 pg (ref 26.0–34.0)
MCHC: 31.7 g/dL (ref 30.0–36.0)
MCV: 88.1 fL (ref 80.0–100.0)
Platelets: 306 10*3/uL (ref 150–400)
RBC: 3.19 MIL/uL — ABNORMAL LOW (ref 3.87–5.11)
RDW: 15.8 % — ABNORMAL HIGH (ref 11.5–15.5)
WBC: 7.1 10*3/uL (ref 4.0–10.5)
nRBC: 0 % (ref 0.0–0.2)

## 2019-08-25 LAB — COMPREHENSIVE METABOLIC PANEL
ALT: 230 U/L — ABNORMAL HIGH (ref 0–44)
AST: 78 U/L — ABNORMAL HIGH (ref 15–41)
Albumin: 2.8 g/dL — ABNORMAL LOW (ref 3.5–5.0)
Alkaline Phosphatase: 75 U/L (ref 38–126)
Anion gap: 11 (ref 5–15)
BUN: 21 mg/dL (ref 8–23)
CO2: 23 mmol/L (ref 22–32)
Calcium: 8.2 mg/dL — ABNORMAL LOW (ref 8.9–10.3)
Chloride: 108 mmol/L (ref 98–111)
Creatinine, Ser: 1.61 mg/dL — ABNORMAL HIGH (ref 0.44–1.00)
GFR calc Af Amer: 36 mL/min — ABNORMAL LOW (ref >60)
GFR calc non Af Amer: 31 mL/min — ABNORMAL LOW (ref >60)
Glucose, Bld: 139 mg/dL — ABNORMAL HIGH (ref 70–99)
Potassium: 3.4 mmol/L — ABNORMAL LOW (ref 3.5–5.1)
Sodium: 142 mmol/L (ref 135–145)
Total Bilirubin: 0.5 mg/dL (ref 0.3–1.2)
Total Protein: 6.3 g/dL — ABNORMAL LOW (ref 6.5–8.1)

## 2019-08-25 LAB — GLUCOSE, CAPILLARY
Glucose-Capillary: 134 mg/dL — ABNORMAL HIGH (ref 70–99)
Glucose-Capillary: 208 mg/dL — ABNORMAL HIGH (ref 70–99)
Glucose-Capillary: 203 mg/dL — ABNORMAL HIGH (ref 70–99)
Glucose-Capillary: 195 mg/dL — ABNORMAL HIGH (ref 70–99)

## 2019-08-25 LAB — CK: Total CK: 1190 U/L — ABNORMAL HIGH (ref 38–234)

## 2019-08-25 MED ORDER — POTASSIUM CHLORIDE CRYS ER 20 MEQ PO TBCR
40.0000 meq | EXTENDED_RELEASE_TABLET | Freq: Once | ORAL | Status: AC
  Administered 2019-08-25: 40 meq via ORAL
  Filled 2019-08-25: qty 2

## 2019-08-25 NOTE — Progress Notes (Signed)
PROGRESS NOTE    Karen West  ION:[id number] DOB: [birth year] DOA: 08/22/2019 PCP: Deland Pretty, MD    Brief Narrative:  79 y.o. female with medical history significant for coronary artery disease with DES placed in April 2021, history of lung cancer status post resection and now under observation, chronic renal insufficiency, chronic anemia, type 2 diabetes mellitus, and OSA on CPAP, now presenting to emergency department for evaluation of recent diarrhea, bilateral leg tenderness, and abnormal outpatient blood work.  Patient reports that her statin was doubled a couple months ago, she developed nonbloody diarrhea that has now resolved after stopping her magnesium supplement, but she has developed tenderness in the bilateral lower extremities over the past week or so without any trauma or inciting event.  She was seen at urgent care about a week ago when she was having diarrhea, had negative COVID-19 test and negative GI pathogen panel at that time but was noted to have elevated LFTs.  She reports follow-up blood work that included an elevated serum CK and increased creatinine, and she was directed to the ED for evaluation and management of this.  ED Course: Upon arrival to the ED, patient is found to be afebrile, saturating well on room air, and with stable blood pressure.  EKG features sinus rhythm with incomplete RBBB.  Chest x-ray notable for postoperative changes and scarring without edema or airspace opacity.  Chemistry panel notable for AST 284, ALT 525, normal bilirubin, and creatinine 1.88, improved from a week ago, but up from an apparent baseline of roughly 1.15.  CBC features a stable chronic normocytic anemia with hemoglobin 9.5.  Patient was given 500 cc of saline in the ED.    Assessment & Plan:   Principal Problem:   Rhabdomyolysis Active Problems:   Type 2 diabetes mellitus with complication, without long-term current use of insulin (HCC)   Essential hypertension   OSA on  CPAP   Chronic anemia   Adenocarcinoma of left lung, stage 1 (HCC)   Carotid artery disease (HCC)   Acute renal failure superimposed on stage 3 chronic kidney disease (HCC)   Elevated transaminase level   1. Rhabdomyolysis  - Presents for eval/mgmt of elevated CK and creatinine on outpatient blood work, reports that statin was increased in March, she had 2-3 wks of diarrhea that is now resolved, and now has 1 wk of bilateral leg tenderness  - Presenting CK is 5585, urine is clear with "large Hgb" but 0-5 rbc/hpf  - Legs initially noted to be tender but improved today, still tender over R knee -CK remains elevated but is down to 1190 - Continue IVF hydration as tolerated, hold statin, monitor renal function and CK trends in AM -PT consulted, no needs recommended  2. Acute kidney injury superimposed on CKD IIIa  - Presenting SCr is 1.88 in ED, down from 2.24 on 5/29, but up from 1.58 on 07/28/19 and 1.15 in April 2021  - Baseline appears to be ~1.15  - Prerenal azotemia in setting of diarrhea vs heme pigment-induced injury  - Continued on IVF hydration. Cr slightly worse today, up to 1.61 - Recheck bmet in AM  3. Elevated transaminases  - Presenting AST is 284 and ALT 525 in ED with normal alk phos, normal bili, and benign abdominal exam  - She had been experiencing loose stools and nausea recently without fevers or abdominal pain  - This is likely secondary to rhabdomyolysis. Viral hepatitis panel is neg -LFT cont to trend down with  hydration   4. CAD - No anginal complaints  - She had NSTEMI in April with DES placed 07/03/19  - Continue DAPT and beta-blocker, hold statin for now as above   -Would defer to outpt setting regarding when to resume and if so, likely back to lower dose which pt had previously tolerated -LDL from 07/03/19 of 61  5. Anemia  - Hgb is 9.5 on admission, similar to priors  - Patient denies melena, hematochezia, or other bleeding  - Anticipate dilutional  drop, type and screen, monitor   -Cont to follow for now  6. Type II DM  - A1c was 7.3% in April 2021  - continue with low-intensity SSI as needed  DVT prophylaxis: heparin subq Code Status: Full Family Communication: Pt in room, family not at bedside  Status is: Inpatient  Remains inpatient appropriate because:Persistent severe electrolyte disturbances   Dispo: The patient is from: Home              Anticipated d/c is to: Home              Anticipated d/c date is: 1 day              Patient currently is not medically stable to d/c.   Consultants:     Procedures:     Antimicrobials: Anti-infectives (From admission, onward)   None      Subjective: Feeling better. Eager to go home soon  Objective: Vitals:   08/24/19 1647 08/24/19 2028 08/25/19 0504 08/25/19 0942  BP: 124/71 (!) 140/57 (!) 143/60 (!) 147/54  Pulse: 72  70 70  Resp: '16 17 18 18  '$ Temp: 98.7 F (37.1 C) 97.8 F (36.6 C) 98 F (36.7 C) 97.9 F (36.6 C)  TempSrc:  Oral Oral Oral  SpO2: 100% 99% 100% 99%  Weight:      Height:        Intake/Output Summary (Last 24 hours) at 08/25/2019 1621 Last data filed at 08/25/2019 1300 Gross per 24 hour  Intake 2577.09 ml  Output 900 ml  Net 1677.09 ml   Filed Weights   08/22/19 1335 08/23/19 0135 08/23/19 2025  Weight: 64.4 kg 65.8 kg 67.5 kg    Examination: General exam: Conversant, in no acute distress Respiratory system: normal chest rise, clear, no audible wheezing Cardiovascular system: regular rhythm, s1-s2 Gastrointestinal system: Nondistended, nontender, pos BS Central nervous system: No seizures, no tremors Extremities: No cyanosis, no joint deformities Skin: No rashes, no pallor Psychiatry: Affect normal // no auditory hallucinations   Data Reviewed: I have personally reviewed following labs and imaging studies  CBC: Recent Labs  Lab 08/22/19 1406 08/23/19 0313 08/24/19 0627 08/25/19 0352  WBC 9.4 7.5 6.9 7.1  HGB 9.5* 9.0*  8.6* 8.9*  HCT 29.9* 27.7* 26.5* 28.1*  MCV 87.4 86.3 86.9 88.1  PLT 333 292 308 224   Basic Metabolic Panel: Recent Labs  Lab 08/22/19 1406 08/23/19 0313 08/24/19 0627 08/25/19 0352  NA 138 140 143 142  K 4.0 3.5 3.7 3.4*  CL 103 105 107 108  CO2 '22 22 23 23  '$ GLUCOSE 268* 179* 145* 139*  BUN 38* 35* 23 21  CREATININE 1.88* 1.66* 1.46* 1.61*  CALCIUM 8.4* 8.3* 8.3* 8.2*   GFR: Estimated Creatinine Clearance: 26.8 mL/min (A) (by C-G formula based on SCr of 1.61 mg/dL (H)). Liver Function Tests: Recent Labs  Lab 08/22/19 1419 08/23/19 0313 08/24/19 0627 08/25/19 0352  AST 289* 215* 134* 78*  ALT 525*  456* 300* 230*  ALKPHOS 97 91 75 75  BILITOT 0.6 0.7 0.3 0.5  PROT 7.2 6.5 6.0* 6.3*  ALBUMIN 3.3* 3.0* 2.7* 2.8*   No results for input(s): LIPASE, AMYLASE in the last 168 hours. No results for input(s): AMMONIA in the last 168 hours. Coagulation Profile: No results for input(s): INR, PROTIME in the last 168 hours. Cardiac Enzymes: Recent Labs  Lab 08/22/19 1419 08/23/19 0313 08/24/19 0627 08/25/19 0352  CKTOTAL 5,585* 3,915* 2,203* 1,190*   BNP (last 3 results) No results for input(s): PROBNP in the last 8760 hours. HbA1C: No results for input(s): HGBA1C in the last 72 hours. CBG: Recent Labs  Lab 08/24/19 1115 08/24/19 1621 08/24/19 2111 08/25/19 0708 08/25/19 1114  GLUCAP 193* 266* 208* 134* 208*   Lipid Profile: No results for input(s): CHOL, HDL, LDLCALC, TRIG, CHOLHDL, LDLDIRECT in the last 72 hours. Thyroid Function Tests: No results for input(s): TSH, T4TOTAL, FREET4, T3FREE, THYROIDAB in the last 72 hours. Anemia Panel: No results for input(s): VITAMINB12, FOLATE, FERRITIN, TIBC, IRON, RETICCTPCT in the last 72 hours. Sepsis Labs: No results for input(s): PROCALCITON, LATICACIDVEN in the last 168 hours.  Recent Results (from the past 240 hour(s))  SARS CORONAVIRUS 2 (TAT 6-24 HRS) Nasopharyngeal Nasopharyngeal Swab     Status: None    Collection Time: 08/15/19  5:23 PM   Specimen: Nasopharyngeal Swab  Result Value Ref Range Status   SARS Coronavirus 2 NEGATIVE NEGATIVE Final    Comment: (NOTE) SARS-CoV-2 target nucleic acids are NOT DETECTED. The SARS-CoV-2 RNA is generally detectable in upper and lower respiratory specimens during the acute phase of infection. Negative results do not preclude SARS-CoV-2 infection, do not rule out co-infections with other pathogens, and should not be used as the sole basis for treatment or other patient management decisions. Negative results must be combined with clinical observations, patient history, and epidemiological information. The expected result is Negative. Fact Sheet for Patients: SugarRoll.be Fact Sheet for Healthcare Providers: https://www.woods-mathews.com/ This test is not yet approved or cleared by the [place] FDA and  has been authorized for detection and/or diagnosis of SARS-CoV-2 by FDA under an Emergency Use Authorization (EUA). This EUA will remain  in effect (meaning this test can be used) for the duration of the COVID-19 declaration under Section 56 4(b)(1) of the Act, 21 U.S.C. section 360bbb-3(b)(1), unless the authorization is terminated or revoked sooner. Performed at [facility], [place] [address], [place], [place] [address]   Gastrointestinal Panel by PCR , Stool     Status: None   Collection Time: 08/15/19  7:18 PM   Specimen: Stool  Result Value Ref Range Status   Campylobacter species NOT DETECTED NOT DETECTED Final   Plesimonas shigelloides NOT DETECTED NOT DETECTED Final   Salmonella species NOT DETECTED NOT DETECTED Final   Yersinia enterocolitica NOT DETECTED NOT DETECTED Final   Vibrio species NOT DETECTED NOT DETECTED Final   Vibrio cholerae NOT DETECTED NOT DETECTED Final   Enteroaggregative E coli (EAEC) NOT DETECTED NOT DETECTED Final   Enteropathogenic E coli (EPEC) NOT DETECTED NOT  DETECTED Final   Enterotoxigenic E coli (ETEC) NOT DETECTED NOT DETECTED Final   Shiga like toxin producing E coli (STEC) NOT DETECTED NOT DETECTED Final   Shigella/Enteroinvasive E coli (EIEC) NOT DETECTED NOT DETECTED Final   Cryptosporidium NOT DETECTED NOT DETECTED Final   Cyclospora cayetanensis NOT DETECTED NOT DETECTED Final   Entamoeba histolytica NOT DETECTED NOT DETECTED Final   Giardia lamblia NOT  DETECTED NOT DETECTED Final   Adenovirus F40/41 NOT DETECTED NOT DETECTED Final   Astrovirus NOT DETECTED NOT DETECTED Final   Norovirus GI/GII NOT DETECTED NOT DETECTED Final   Rotavirus A NOT DETECTED NOT DETECTED Final   Sapovirus (I, II, IV, and V) NOT DETECTED NOT DETECTED Final    Comment: Performed at [facility], [address] [place], [place] [address]  SARS Coronavirus 2 by RT PCR (hospital order, performed in [facility] hospital lab) Nasopharyngeal Nasopharyngeal Swab     Status: None   Collection Time: 08/22/19 11:38 PM   Specimen: Nasopharyngeal Swab  Result Value Ref Range Status   SARS Coronavirus 2 NEGATIVE NEGATIVE Final    Comment: (NOTE) SARS-CoV-2 target nucleic acids are NOT DETECTED. The SARS-CoV-2 RNA is generally detectable in upper and lower respiratory specimens during the acute phase of infection. The lowest concentration of SARS-CoV-2 viral copies this assay can detect is 250 copies / mL. A negative result does not preclude SARS-CoV-2 infection and should not be used as the sole basis for treatment or other patient management decisions.  A negative result may occur with improper specimen collection / handling, submission of specimen other than nasopharyngeal swab, presence of viral mutation(s) within the areas targeted by this assay, and inadequate number of viral copies (<250 copies / mL). A negative result must be combined with clinical observations, patient history, and epidemiological information. Fact Sheet for Patients:     StrictlyIdeas.no Fact Sheet for Healthcare Providers: BankingDealers.co.za This test is not yet approved or cleared  by the [place] FDA and has been authorized for detection and/or diagnosis of SARS-CoV-2 by FDA under an Emergency Use Authorization (EUA).  This EUA will remain in effect (meaning this test can be used) for the duration of the COVID-19 declaration under Section 564(b)(1) of the Act, 21 U.S.C. section 360bbb-3(b)(1), unless the authorization is terminated or revoked sooner. Performed at [facility], [place] [address], [place], [place] [address]      Radiology Studies: No results found.  Scheduled Meds: . aspirin  81 mg Oral Daily  . heparin  5,000 Units Subcutaneous Q8H  . insulin aspart  0-5 Units Subcutaneous QHS  . insulin aspart  0-9 Units Subcutaneous TID WC  . metoprolol tartrate  25 mg Oral BID  . ticagrelor  90 mg Oral BID   Continuous Infusions: . lactated ringers 100 mL/hr at 08/25/19 0806     LOS: 3 days   Marylu Lund, MD [facility] Pager On Amion  If 7PM-7AM, please contact night-coverage 08/25/2019, 4:21 PM

## 2019-08-26 DIAGNOSIS — E785 Hyperlipidemia, unspecified: Secondary | ICD-10-CM | POA: Diagnosis not present

## 2019-08-26 DIAGNOSIS — Z8739 Personal history of other diseases of the musculoskeletal system and connective tissue: Secondary | ICD-10-CM | POA: Diagnosis not present

## 2019-08-26 DIAGNOSIS — E1165 Type 2 diabetes mellitus with hyperglycemia: Secondary | ICD-10-CM | POA: Diagnosis not present

## 2019-08-26 DIAGNOSIS — I10 Essential (primary) hypertension: Secondary | ICD-10-CM

## 2019-08-26 DIAGNOSIS — Z09 Encounter for follow-up examination after completed treatment for conditions other than malignant neoplasm: Secondary | ICD-10-CM | POA: Diagnosis not present

## 2019-08-26 DIAGNOSIS — E1121 Type 2 diabetes mellitus with diabetic nephropathy: Secondary | ICD-10-CM | POA: Diagnosis not present

## 2019-08-26 LAB — GLUCOSE, CAPILLARY
Glucose-Capillary: 157 mg/dL — ABNORMAL HIGH (ref 70–99)
Glucose-Capillary: 248 mg/dL — ABNORMAL HIGH (ref 70–99)

## 2019-08-26 LAB — CK: Total CK: 664 U/L — ABNORMAL HIGH (ref 38–234)

## 2019-08-26 LAB — CBC
HCT: 27.2 % — ABNORMAL LOW (ref 36.0–46.0)
Hemoglobin: 8.9 g/dL — ABNORMAL LOW (ref 12.0–15.0)
MCH: 27.9 pg (ref 26.0–34.0)
MCHC: 32.7 g/dL (ref 30.0–36.0)
MCV: 85.3 fL (ref 80.0–100.0)
Platelets: 301 10*3/uL (ref 150–400)
RBC: 3.19 MIL/uL — ABNORMAL LOW (ref 3.87–5.11)
RDW: 16.1 % — ABNORMAL HIGH (ref 11.5–15.5)
WBC: 7.1 10*3/uL (ref 4.0–10.5)
nRBC: 0 % (ref 0.0–0.2)

## 2019-08-26 LAB — COMPREHENSIVE METABOLIC PANEL
ALT: 181 U/L — ABNORMAL HIGH (ref 0–44)
AST: 51 U/L — ABNORMAL HIGH (ref 15–41)
Albumin: 2.7 g/dL — ABNORMAL LOW (ref 3.5–5.0)
Alkaline Phosphatase: 70 U/L (ref 38–126)
Anion gap: 12 (ref 5–15)
BUN: 18 mg/dL (ref 8–23)
CO2: 23 mmol/L (ref 22–32)
Calcium: 8.4 mg/dL — ABNORMAL LOW (ref 8.9–10.3)
Chloride: 105 mmol/L (ref 98–111)
Creatinine, Ser: 1.32 mg/dL — ABNORMAL HIGH (ref 0.44–1.00)
GFR calc Af Amer: 45 mL/min — ABNORMAL LOW (ref >60)
GFR calc non Af Amer: 39 mL/min — ABNORMAL LOW (ref >60)
Glucose, Bld: 163 mg/dL — ABNORMAL HIGH (ref 70–99)
Potassium: 4.1 mmol/L (ref 3.5–5.1)
Sodium: 140 mmol/L (ref 135–145)
Total Bilirubin: 0.4 mg/dL (ref 0.3–1.2)
Total Protein: 6.2 g/dL — ABNORMAL LOW (ref 6.5–8.1)

## 2019-08-26 MED ORDER — SITAGLIPTIN PHOSPHATE 50 MG PO TABS
50.0000 mg | ORAL_TABLET | Freq: Every day | ORAL | 0 refills | Status: DC
Start: 2019-08-26 — End: 2019-10-08

## 2019-08-26 MED ORDER — SITAGLIPTIN PHOSPHATE 50 MG PO TABS
50.0000 mg | ORAL_TABLET | Freq: Every day | ORAL | 0 refills | Status: DC
Start: 2019-08-26 — End: 2019-08-26

## 2019-08-26 NOTE — Discharge Instructions (Signed)
Continue to hold Crestor until seen by your doctor

## 2019-08-26 NOTE — Progress Notes (Signed)
Per patient, she has walker at home.  She received it in 2019.  No further need for medical equipment.  Earleen Reaper RN

## 2019-08-26 NOTE — Progress Notes (Signed)
Patient doesn't want to wear CPAP tonight. Stated she hasn't worn for 2 years and is okay. She will call if anything changes

## 2019-08-26 NOTE — Progress Notes (Signed)
Physical Therapy Treatment Patient Details Name: Karen West MRN: [id number] DOB: [birth year] Today's Date: 08/26/2019    History of Present Illness Karen West is a 79 y.o. female with medical history significant for coronary artery disease with DES placed in April 2021, history of lung cancer status post resection and now under observation, chronic renal insufficiency, chronic anemia, type 2 diabetes mellitus, and OSA on CPAP, now presenting to emergency department for evaluation of recent diarrhea, bilateral leg tenderness, and abnormal outpatient blood work.  Patient reports that her statin was doubled a couple months ago, she developed nonbloody diarrhea that has now resolved after stopping her magnesium supplement, but she has developed tenderness in the bilateral lower extremities over the past week or so without any trauma or inciting event. diagnosed with Rhabdomyolysis    PT Comments    Continuing work on functional mobility and activity tolerance;  Notable improvement in gait smoothness and speed, and activity tolerance since PT eval 2 days ago; Tells me she already has a rW and uses it when going out in the community, or going out to get mail; inside her home, she furniture walks; Managed more than her typical in home distance today, OK for dc home from PT standpoint   Follow Up Recommendations  No PT follow up     Equipment Recommendations  Rolling walker with 5" wheels(she already has one)    Recommendations for Other Services       Precautions / Restrictions Precautions Precautions: None Restrictions Weight Bearing Restrictions: No    Mobility  Bed Mobility Overal bed mobility: Modified Independent                Transfers Overall transfer level: Modified independent                  Ambulation/Gait Ambulation/Gait assistance: Supervision Gait Distance (Feet): 80 Feet Assistive device: Rolling walker (2 wheeled);None Gait Pattern/deviations:  Step-through pattern     General Gait Details: smoother pattern and better gait velocity; initiated with RW, then walked without assistive device without gross difficulty   Stairs             Wheelchair Mobility    Modified Rankin (Stroke Patients Only)       Balance Overall balance assessment: (Improved)                                          Cognition Arousal/Alertness: Awake/alert Behavior During Therapy: WFL for tasks assessed/performed Overall Cognitive Status: Within Functional Limits for tasks assessed                                        Exercises      General Comments        Pertinent Vitals/Pain Pain Assessment: No/denies pain Pain Location: Denies pain, but still reports soreness is "working its way out" Pain Intervention(s): Monitored during session    Home Living                      Prior Function            PT Goals (current goals can now be found in the care plan section) Acute Rehab PT Goals Patient Stated Goal: Hopes to be home soon PT Goal Formulation: With patient  Time For Goal Achievement: 09/07/19 Potential to Achieve Goals: Good Progress towards PT goals: Progressing toward goals    Frequency    Min 3X/week      PT Plan Current plan remains appropriate    Co-evaluation              AM-PAC PT "6 Clicks" Mobility   Outcome Measure  Help needed turning from your back to your side while in a flat bed without using bedrails?: None Help needed moving from lying on your back to sitting on the side of a flat bed without using bedrails?: None Help needed moving to and from a bed to a chair (including a wheelchair)?: None Help needed standing up from a chair using your arms (e.g., wheelchair or bedside chair)?: None Help needed to walk in hospital room?: None Help needed climbing 3-5 steps with a railing? : A Little 6 Click Score: 23    End of Session Equipment Utilized  During Treatment: Gait belt Activity Tolerance: Patient tolerated treatment well Patient left: in bed;Other (comment)(Discussing status with Dr. Wyline Copas) Nurse Communication: Mobility status PT Visit Diagnosis: Other abnormalities of gait and mobility (R26.89);Muscle weakness (generalized) (M62.81)     Time: 1141-1200 PT Time Calculation (min) (ACUTE ONLY): 19 min  Charges:  $Gait Training: 8-22 mins                     Roney Marion, [place] Pager [phone] Office [place] 08/26/2019, 1:05 PM

## 2019-08-26 NOTE — Progress Notes (Signed)
Discharge instructions reviewed with patient and all questions answered.  PIV removed without complications.  [facility] medication.  Daughter to pick patient up at approximately 1500.  Earleen Reaper RN

## 2019-08-26 NOTE — Discharge Summary (Signed)
Physician Discharge Summary  Karen West ZOX:[id number] DOB: [birth year] DOA: 08/22/2019  PCP: Deland Pretty, MD  Admit date: 08/22/2019 Discharge date: 08/26/2019  Admitted From: Home Disposition:  Home  Recommendations for Outpatient Follow-up:  1. Follow up with PCP in 1-2 weeks 2. Pt advised to remain off Crestor 3. Please follow glucose trends. Januvia was prescribed at time of d/c  Discharge Condition:Improved CODE STATUS:Full Diet recommendation: Diabetic   Brief/Interim Summary: 79 y.o.femalewith medical history significant forcoronary artery disease with DES placed in April 2021, history of lung cancer status post resection and now under observation, chronic renal insufficiency, chronic anemia, type 2 diabetes mellitus, and OSA on CPAP, now presenting to emergency department for evaluation of recent diarrhea, bilateral leg tenderness, and abnormal outpatient blood work. Patient reports that her statin was doubled a couple months ago, she developed nonbloody diarrhea that has now resolved after stopping her magnesium supplement, but she has developed tenderness in the bilateral lower extremities over the past week or so without any trauma or inciting event. She was seen at urgent care about a week ago when she was having diarrhea, had negative COVID-19 test and negative GI pathogen panel at that time but was noted to have elevated LFTs. She reports follow-up blood work that included an elevated serum CK and increased creatinine, and she was directed to the ED for evaluation and management of this.  ED Course:Upon arrival to the ED, patient is found to be afebrile, saturating well on room air, and with stable blood pressure. EKG features sinus rhythm with incomplete RBBB. Chest x-ray notable for postoperative changes and scarring without edema or airspace opacity. Chemistry panel notable for AST 284, ALT 525, normal bilirubin, and creatinine 1.88, improved from a week ago, but up  from an apparent baseline of roughly 1.15. CBC features a stable chronic normocytic anemia with hemoglobin 9.5. Patient was given 500 cc of saline in the ED.   Discharge Diagnoses:  Principal Problem:   Rhabdomyolysis Active Problems:   Type 2 diabetes mellitus with complication, without long-term current use of insulin (HCC)   Essential hypertension   OSA on CPAP   Chronic anemia   Adenocarcinoma of left lung, stage 1 (HCC)   Carotid artery disease (HCC)   Acute renal failure superimposed on stage 3 chronic kidney disease (HCC)   Elevated transaminase level  1.Rhabdomyolysis -Presents for eval/mgmt of elevated CK and creatinine on outpatient blood work, reports that statin was increased in March, she had 2-3 wks of diarrhea that is now resolved, and now has 1 wk of bilateral leg tenderness -Presenting CK is 5585, urine is clear with "large Hgb" but 0-5 rbc/hpf -Legs initially noted to be tender but improved today, still tender over R knee -CK steadily trended to to <1000 with IVF hydration -Voiding well -PT consulted, no needs recommended -Recommend continuing to hold statin. Will defer to PCP as to if/when to resume in the future  2.Acute kidney injury superimposed on CKD IIIa -Presenting SCr is 1.88 in ED, down from 2.24 on 5/29, but up from 1.58 on 07/28/19 and 1.15 in April 2021 -Baseline appears to be ~1.15 -Prerenal azotemia in setting of diarrhea vs heme pigment-induced injury -Continued on IVF hydration. Cr improved with IVF hydration  3.Elevated transaminases -Presenting AST is 284 and ALT 525 in ED with normal alk phos, normal bili, and benign abdominal exam -She had been experiencing loose stools and nausea recently without fevers or abdominal pain -This is likely secondary to rhabdomyolysis. Viral  hepatitis panel is neg -LFT cont to trend down with hydration   4.CAD -No anginal complaints -She had NSTEMI in April with DES placed  07/03/19 -Continue DAPT and beta-blocker, hold statin for now as above -Would defer to outpt setting regarding when to resume and if so, likely back to lower dose which pt had previously tolerated -LDL from 07/03/19 of 61  5.Anemia -Hgb is 9.5 on admission, similar to priors -Patient denies melena, hematochezia, or other bleeding -Anticipate dilutional drop, type and screen, monitor -remained stable  6.Type II DM -A1c was 7.3% in April 2021 -continue with low-intensity SSI as needed while in hospital   Discharge Instructions   Allergies as of 08/26/2019      Reactions   Tramadol Nausea And Vomiting, Other (See Comments)   "got sick to my stomach and was throwing up blood; it put me in the hospital")   Lisinopril Rash, Other (See Comments)   Renal failure   Tapazole [methimazole] Itching, Rash      Medication List    STOP taking these medications   NONFORMULARY OR COMPOUNDED ITEM   TRIJARDY XR PO     TAKE these medications   acetaminophen 500 MG tablet Commonly known as: TYLENOL Take 1,000 mg by mouth every 6 (six) hours as needed for moderate pain or headache.   amLODipine 10 MG tablet Commonly known as: NORVASC Take 10 mg by mouth daily.   aspirin 81 MG tablet Take 81 mg by mouth daily.   B-12 [phone] MCG Subl Place 1 tablet under the tongue daily.   ezetimibe 10 MG tablet Commonly known as: ZETIA Take 10 mg by mouth every evening.   Fish Oil 1200 MG Caps Take 1,200 mg by mouth 2 (two) times daily.   hydrocortisone cream 1 % Apply 1 application topically 2 (two) times daily as needed for itching.   levothyroxine 88 MCG tablet Commonly known as: SYNTHROID Take 88 mcg by mouth daily.   metoprolol tartrate 50 MG tablet Commonly known as: LOPRESSOR Take 0.5 tablets (25 mg total) by mouth 2 (two) times daily.   omeprazole 20 MG capsule Commonly known as: PRILOSEC Take 20 mg by mouth 2 (two) times daily. Takes prn   Probiotic 1-250  BILLION-MG Caps Take 1 capsule by mouth daily.   sitaGLIPtin 50 MG tablet Commonly known as: Januvia Take 1 tablet (50 mg total) by mouth daily.   ticagrelor 90 MG Tabs tablet Commonly known as: BRILINTA Take 1 tablet (90 mg total) by mouth 2 (two) times daily.            Durable Medical Equipment  (From admission, onward)         Start     Ordered   08/25/19 1527  For home use only DME Walker rolling  Once    Question Answer Comment  Walker: With 5 Inch Wheels   Patient needs a walker to treat with the following condition Weakness      08/25/19 1526         Follow-up Information    Deland Pretty, MD. Schedule an appointment as soon as possible for a visit in 2 week(s).   Specialty: Internal Medicine Contact information: [address] [place] [address] [phone]        Minus Breeding, MD .   Specialty: Cardiology Contact information: [address]  [place] [address] [phone]          Allergies  Allergen Reactions  . Tramadol Nausea  And Vomiting and Other (See Comments)    "got sick to my stomach and was throwing up blood; it put me in the hospital")  . Lisinopril Rash and Other (See Comments)    Renal failure   . Tapazole [Methimazole] Itching and Rash    Procedures/Studies: DG Chest 2 View  Result Date: 08/22/2019 CLINICAL DATA:  Shortness of breath EXAM: CHEST - 2 VIEW COMPARISON:  July 02, 2019 FINDINGS: There is scarring and postoperative change on the left with volume loss, stable. Lungs otherwise are clear. The heart size and pulmonary vascularity are normal. There is calcification of the mitral annulus. No adenopathy. There is aortic atherosclerosis. Postoperative changes noted in the right breast region. Patient is status post left mastectomy. There is degenerative change in the thoracic spine. IMPRESSION: Postoperative changes with scarring and volume loss on the left. No edema or airspace opacity.  Cardiac silhouette within normal limits. No adenopathy. Postoperative change at multiple sites. Aortic Atherosclerosis (ICD10-I70.0). Electronically Signed   By: Lowella Grip III M.D.   On: 08/22/2019 15:13   CT Chest Wo Contrast  Result Date: 07/28/2019 CLINICAL DATA:  Restaging non-small cell lung cancer diagnosed in 2019. Remote history of breast cancer. EXAM: CT CHEST WITHOUT CONTRAST TECHNIQUE: Multidetector CT imaging of the chest was performed following the standard protocol without IV contrast. COMPARISON:  Chest CT 01/28/2019 and 07/26/2018. FINDINGS: Cardiovascular: Dense 3 vessel coronary artery atherosclerosis with lesser involvement of the aorta and great vessels. There are calcifications of the aortic valve and prominent calcifications of the mitral annulus. The heart size is normal. There is no pericardial effusion. Mediastinum/Nodes: There are no enlarged mediastinal, hilar, axillary or internal mammary lymph nodes. Small mediastinal lymph nodes are stable. There are stable postsurgical changes in both axilla.Probable previous thyroidectomy. The trachea and esophagus demonstrate no significant findings. Lungs/Pleura: Stable postsurgical changes from left upper lobectomy. There is a stable calcified granuloma along the superior aspect of the right major fissure. No suspicious pulmonary nodularity. No pleural effusion or pneumothorax. Upper abdomen: No suspicious findings are seen within the visualized upper abdomen. There is lobularity of the kidneys with a hyperdense lesion posteriorly in the upper pole of the left kidney, measuring up to 3.2 cm on image 127/2. This was seen on PET-CT 02/28/2017 and was not hypermetabolic. Musculoskeletal/Chest wall: There is no chest wall mass or suspicious osseous finding. Postsurgical changes in both breasts and axilla. IMPRESSION: 1. Stable chest CT status post left upper lobectomy. No evidence of local recurrence or metastatic disease. 2. Coronary and  Aortic Atherosclerosis (ICD10-I70.0). Electronically Signed   By: Richardean Sale M.D.   On: 07/28/2019 11:06     Subjective: Eager to go home  Discharge Exam: Vitals:   08/26/19 0408 08/26/19 0915  BP: 129/63 (!) 170/71  Pulse: 68 67  Resp: 16 18  Temp: 99.2 F (37.3 C) 98 F (36.7 C)  SpO2: 99% 100%   Vitals:   08/25/19 1635 08/25/19 2045 08/26/19 0408 08/26/19 0915  BP: (!) 156/68 (!) 129/48 129/63 (!) 170/71  Pulse: 74 71 68 67  Resp: '18 16 16 18  '$ Temp: 98 F (36.7 C) 98.7 F (37.1 C) 99.2 F (37.3 C) 98 F (36.7 C)  TempSrc: Oral Oral Oral Oral  SpO2: 99% 99% 99% 100%  Weight:  66.9 kg    Height:        General: Pt is alert, awake, not in acute distress Cardiovascular: RRR, S1/S2 +, no rubs, no gallops Respiratory: CTA bilaterally, no  wheezing, no rhonchi Abdominal: Soft, NT, ND, bowel sounds + Extremities: no edema, no cyanosis   The results of significant diagnostics from this hospitalization (including imaging, microbiology, ancillary and laboratory) are listed below for reference.     Microbiology: Recent Results (from the past 240 hour(s))  SARS Coronavirus 2 by RT PCR (hospital order, performed in [facility] hospital lab) Nasopharyngeal Nasopharyngeal Swab     Status: None   Collection Time: 08/22/19 11:38 PM   Specimen: Nasopharyngeal Swab  Result Value Ref Range Status   SARS Coronavirus 2 NEGATIVE NEGATIVE Final    Comment: (NOTE) SARS-CoV-2 target nucleic acids are NOT DETECTED. The SARS-CoV-2 RNA is generally detectable in upper and lower respiratory specimens during the acute phase of infection. The lowest concentration of SARS-CoV-2 viral copies this assay can detect is 250 copies / mL. A negative result does not preclude SARS-CoV-2 infection and should not be used as the sole basis for treatment or other patient management decisions.  A negative result may occur with improper specimen collection / handling, submission of specimen  other than nasopharyngeal swab, presence of viral mutation(s) within the areas targeted by this assay, and inadequate number of viral copies (<250 copies / mL). A negative result must be combined with clinical observations, patient history, and epidemiological information. Fact Sheet for Patients:   StrictlyIdeas.no Fact Sheet for Healthcare Providers: BankingDealers.co.za This test is not yet approved or cleared  by the [place] FDA and has been authorized for detection and/or diagnosis of SARS-CoV-2 by FDA under an Emergency Use Authorization (EUA).  This EUA will remain in effect (meaning this test can be used) for the duration of the COVID-19 declaration under Section 564(b)(1) of the Act, 21 U.S.C. section 360bbb-3(b)(1), unless the authorization is terminated or revoked sooner. Performed at [facility], [place] [address], [place], [place] [address]      Labs: BNP (last 3 results) No results for input(s): BNP in the last 8760 hours. Basic Metabolic Panel: Recent Labs  Lab 08/22/19 1406 08/23/19 0313 08/24/19 0627 08/25/19 0352 08/26/19 0342  NA 138 140 143 142 140  K 4.0 3.5 3.7 3.4* 4.1  CL 103 105 107 108 105  CO2 '22 22 23 23 23  '$ GLUCOSE 268* 179* 145* 139* 163*  BUN 38* 35* '23 21 18  '$ CREATININE 1.88* 1.66* 1.46* 1.61* 1.32*  CALCIUM 8.4* 8.3* 8.3* 8.2* 8.4*   Liver Function Tests: Recent Labs  Lab 08/22/19 1419 08/23/19 0313 08/24/19 0627 08/25/19 0352 08/26/19 0342  AST 289* 215* 134* 78* 51*  ALT 525* 456* 300* 230* 181*  ALKPHOS 97 91 75 75 70  BILITOT 0.6 0.7 0.3 0.5 0.4  PROT 7.2 6.5 6.0* 6.3* 6.2*  ALBUMIN 3.3* 3.0* 2.7* 2.8* 2.7*   No results for input(s): LIPASE, AMYLASE in the last 168 hours. No results for input(s): AMMONIA in the last 168 hours. CBC: Recent Labs  Lab 08/22/19 1406 08/23/19 0313 08/24/19 0627 08/25/19 0352 08/26/19 0342  WBC 9.4 7.5 6.9 7.1 7.1  HGB 9.5* 9.0*  8.6* 8.9* 8.9*  HCT 29.9* 27.7* 26.5* 28.1* 27.2*  MCV 87.4 86.3 86.9 88.1 85.3  PLT 333 292 308 306 301   Cardiac Enzymes: Recent Labs  Lab 08/22/19 1419 08/23/19 0313 08/24/19 0627 08/25/19 0352 08/26/19 0342  CKTOTAL 5,585* 3,915* 2,203* 1,190* 664*   BNP: Invalid input(s): POCBNP CBG: Recent Labs  Lab 08/25/19 1114 08/25/19 1632 08/25/19 2045 08/26/19 0640 08/26/19 1118  GLUCAP 208* 203* 195* 157* 248*  D-Dimer No results for input(s): DDIMER in the last 72 hours. Hgb A1c No results for input(s): HGBA1C in the last 72 hours. Lipid Profile No results for input(s): CHOL, HDL, LDLCALC, TRIG, CHOLHDL, LDLDIRECT in the last 72 hours. Thyroid function studies No results for input(s): TSH, T4TOTAL, T3FREE, THYROIDAB in the last 72 hours.  Invalid input(s): FREET3 Anemia work up No results for input(s): VITAMINB12, FOLATE, FERRITIN, TIBC, IRON, RETICCTPCT in the last 72 hours. Urinalysis    Component Value Date/Time   COLORURINE STRAW (A) 08/22/2019 2033   APPEARANCEUR CLEAR 08/22/2019 2033   LABSPEC 1.010 08/22/2019 2033   PHURINE 6.0 08/22/2019 2033   GLUCOSEU >=500 (A) 08/22/2019 2033   HGBUR LARGE (A) 08/22/2019 2033   BILIRUBINUR NEGATIVE 08/22/2019 2033   KETONESUR NEGATIVE 08/22/2019 2033   PROTEINUR 100 (A) 08/22/2019 2033   UROBILINOGEN 1.0 02/07/2008 1645   NITRITE NEGATIVE 08/22/2019 2033   LEUKOCYTESUR NEGATIVE 08/22/2019 2033   Sepsis Labs Invalid input(s): PROCALCITONIN,  WBC,  LACTICIDVEN Microbiology Recent Results (from the past 240 hour(s))  SARS Coronavirus 2 by RT PCR (hospital order, performed in [place] hospital lab) Nasopharyngeal Nasopharyngeal Swab     Status: None   Collection Time: 08/22/19 11:38 PM   Specimen: Nasopharyngeal Swab  Result Value Ref Range Status   SARS Coronavirus 2 NEGATIVE NEGATIVE Final    Comment: (NOTE) SARS-CoV-2 target nucleic acids are NOT DETECTED. The SARS-CoV-2 RNA is generally detectable in upper  and lower respiratory specimens during the acute phase of infection. The lowest concentration of SARS-CoV-2 viral copies this assay can detect is 250 copies / mL. A negative result does not preclude SARS-CoV-2 infection and should not be used as the sole basis for treatment or other patient management decisions.  A negative result may occur with improper specimen collection / handling, submission of specimen other than nasopharyngeal swab, presence of viral mutation(s) within the areas targeted by this assay, and inadequate number of viral copies (<250 copies / mL). A negative result must be combined with clinical observations, patient history, and epidemiological information. Fact Sheet for Patients:   StrictlyIdeas.no Fact Sheet for Healthcare Providers: BankingDealers.co.za This test is not yet approved or cleared  by the [place] FDA and has been authorized for detection and/or diagnosis of SARS-CoV-2 by FDA under an Emergency Use Authorization (EUA).  This EUA will remain in effect (meaning this test can be used) for the duration of the COVID-19 declaration under Section 564(b)(1) of the Act, 21 U.S.C. section 360bbb-3(b)(1), unless the authorization is terminated or revoked sooner. Performed at [facility], [place] [address], [place], [place] [address]    Time spent: 30 min  SIGNED:   Marylu Lund, MD  [facility] 08/26/2019, 12:30 PM  If 7PM-7AM, please contact night-coverage

## 2019-09-02 DIAGNOSIS — D4102 Neoplasm of uncertain behavior of left kidney: Secondary | ICD-10-CM | POA: Diagnosis not present

## 2019-09-03 ENCOUNTER — Other Ambulatory Visit: Payer: Self-pay | Admitting: *Deleted

## 2019-09-03 ENCOUNTER — Encounter: Payer: Self-pay | Admitting: *Deleted

## 2019-09-03 NOTE — Patient Outreach (Signed)
[facility]) Care Management  09/03/2019  Karen West 02/06/1941 [id number]  Telephone outreach, post hospitalization for rhabdomylosis, now resolved. Pt has hx of DMII, HTN, Hyperlipidemia, CAD (hx MI), GERD, OSA, Hyperthyroidism, Osteopenia, OA, Hx lung ca including lobectomy post radiation.  Spoke with [name] today. Referral for a red flag on emmi discharge call. Pt states that she does not have any questions at this time.  Shared [facility] care management information and asked if pt would like to participate and she agreed.  No needs to address immediately but we will work initially with Diabetes and include education on HTN and CAD.  Patient was recently discharged from hospital and all medications have been reviewed. Outpatient Encounter Medications as of 09/03/2019  Medication Sig  . acetaminophen (TYLENOL) 500 MG tablet Take 1,000 mg by mouth every 6 (six) hours as needed for moderate pain or headache.  Marland Kitchen amLODipine (NORVASC) 10 MG tablet Take 10 mg by mouth daily.  Marland Kitchen aspirin 81 MG tablet Take 81 mg by mouth daily.    . Bacillus Coagulans-Inulin (PROBIOTIC) 1-250 BILLION-MG CAPS Take 1 capsule by mouth daily.  . Cobalamin Combinations (B-12) [phone] MCG SUBL Place 1 tablet under the tongue daily.   Marland Kitchen ezetimibe (ZETIA) 10 MG tablet Take 10 mg by mouth every evening.   . hydrocortisone cream 1 % Apply 1 application topically 2 (two) times daily as needed for itching.   . levothyroxine (SYNTHROID, LEVOTHROID) 88 MCG tablet Take 88 mcg by mouth daily.  . metoprolol tartrate (LOPRESSOR) 50 MG tablet Take 0.5 tablets (25 mg total) by mouth 2 (two) times daily.  . Omega-3 Fatty Acids (FISH OIL) 1200 MG CAPS Take 1,200 mg by mouth 2 (two) times daily.   Marland Kitchen omeprazole (PRILOSEC) 20 MG capsule Take 20 mg by mouth 2 (two) times daily. Takes prn  . sitaGLIPtin (JANUVIA) 50 MG tablet Take 1 tablet (50 mg total) by mouth daily.  . ticagrelor (BRILINTA) 90 MG TABS tablet  Take 1 tablet (90 mg total) by mouth 2 (two) times daily.   No facility-administered encounter medications on file as of 09/03/2019.   I will call pt next week for follow up.  [name]. Myrtie Neither, MSN, [facility] Gerontological Nurse Practitioner [facility] Care Management [phone]

## 2019-09-09 DIAGNOSIS — D631 Anemia in chronic kidney disease: Secondary | ICD-10-CM | POA: Diagnosis not present

## 2019-09-09 DIAGNOSIS — Z79899 Other long term (current) drug therapy: Secondary | ICD-10-CM | POA: Diagnosis not present

## 2019-09-09 DIAGNOSIS — D649 Anemia, unspecified: Secondary | ICD-10-CM | POA: Diagnosis not present

## 2019-09-09 DIAGNOSIS — I214 Non-ST elevation (NSTEMI) myocardial infarction: Secondary | ICD-10-CM | POA: Diagnosis not present

## 2019-09-09 DIAGNOSIS — E038 Other specified hypothyroidism: Secondary | ICD-10-CM | POA: Diagnosis not present

## 2019-09-09 DIAGNOSIS — E559 Vitamin D deficiency, unspecified: Secondary | ICD-10-CM | POA: Diagnosis not present

## 2019-09-11 ENCOUNTER — Other Ambulatory Visit: Payer: Self-pay | Admitting: *Deleted

## 2019-09-11 NOTE — Patient Outreach (Signed)
[facility]) Care Management  09/11/2019  DAISEY CALOCA 1940-05-21 [id number]  Transition of care call.  Mrs. Totman is doing well. She has seen her primary care MD. She is not having any complications. Glucose levels running higher than usual. Lipid levels checked, her cholesterol is elevated 275 and her LDL is 199. Both of these are considerable higher that her levels in April.  Discussed diet. She is following a good heart healthy diet, fruits, vegetables, low fat, hardly any fried foods at all, skim milk.   Exercise, she is walking.  Patient was recently discharged from hospital and all medications have been reviewed. Outpatient Encounter Medications as of 09/11/2019  Medication Sig  . acetaminophen (TYLENOL) 500 MG tablet Take 1,000 mg by mouth every 6 (six) hours as needed for moderate pain or headache.  Marland Kitchen amLODipine (NORVASC) 10 MG tablet Take 10 mg by mouth daily.  Marland Kitchen aspirin 81 MG tablet Take 81 mg by mouth daily.    . Bacillus Coagulans-Inulin (PROBIOTIC) 1-250 BILLION-MG CAPS Take 1 capsule by mouth daily.  . Cobalamin Combinations (B-12) [phone] MCG SUBL Place 1 tablet under the tongue daily.   Marland Kitchen ezetimibe (ZETIA) 10 MG tablet Take 10 mg by mouth every evening.   . hydrocortisone cream 1 % Apply 1 application topically 2 (two) times daily as needed for itching.   . levothyroxine (SYNTHROID, LEVOTHROID) 88 MCG tablet Take 88 mcg by mouth daily.  . metoprolol tartrate (LOPRESSOR) 50 MG tablet Take 0.5 tablets (25 mg total) by mouth 2 (two) times daily.  . Omega-3 Fatty Acids (FISH OIL) 1200 MG CAPS Take 1,200 mg by mouth 2 (two) times daily.   Marland Kitchen omeprazole (PRILOSEC) 20 MG capsule Take 20 mg by mouth 2 (two) times daily. Takes prn  . sitaGLIPtin (JANUVIA) 50 MG tablet Take 1 tablet (50 mg total) by mouth daily.  . ticagrelor (BRILINTA) 90 MG TABS tablet Take 1 tablet (90 mg total) by mouth 2 (two) times daily.   No facility-administered encounter  medications on file as of 09/11/2019.   Fall Risk  09/12/2019 10/11/2017 09/26/2017 11/16/2015  Falls in the past year? 0 No No No  Comment - - - Emmi Telephone Survey: data to providers prior to load  Number falls in past yr: 0 - - -  Injury with Fall? 0 - - -  Risk for fall due to : Medication side effect Impaired mobility Impaired vision;Impaired mobility -  Risk for fall due to: Comment - Right great toe discomfort seeing Rheumatolgist. - -   Depression screen [facility] 2/9 09/12/2019 01/15/2018 10/11/2017 09/26/2017  Decreased Interest 0 0 0 0  Down, Depressed, Hopeless 0 0 0 0  PHQ - 2 Score 0 0 0 0  Altered sleeping - 1 0 0  Tired, decreased energy - 1 1 1   Change in appetite - 2 2 3   Feeling bad or failure about yourself  - 0 0 0  Trouble concentrating - 0 0 0  Moving slowly or fidgety/restless - 0 1 0  Suicidal thoughts - 0 0 0  PHQ-9 Score - 4 4 4   Difficult doing work/chores - Not difficult at all Not difficult at all Not difficult at all  Some recent data might be hidden   Will follow up in one week.   [name]. Myrtie Neither, MSN, [facility] Gerontological Nurse Practitioner [facility] Care Management [phone]

## 2019-09-12 ENCOUNTER — Encounter: Payer: Self-pay | Admitting: *Deleted

## 2019-09-15 ENCOUNTER — Other Ambulatory Visit: Payer: Self-pay

## 2019-09-15 ENCOUNTER — Other Ambulatory Visit: Payer: Self-pay | Admitting: Internal Medicine

## 2019-09-15 ENCOUNTER — Ambulatory Visit
Admission: RE | Admit: 2019-09-15 | Discharge: 2019-09-15 | Disposition: A | Discharge status: Home / Self Care | Payer: Medicare Other | Source: Ambulatory Visit | Attending: Internal Medicine | Admitting: Internal Medicine

## 2019-09-15 DIAGNOSIS — Z1231 Encounter for screening mammogram for malignant neoplasm of breast: Secondary | ICD-10-CM

## 2019-09-18 ENCOUNTER — Ambulatory Visit: Payer: Self-pay | Admitting: *Deleted

## 2019-09-23 NOTE — Progress Notes (Signed)
Cardiology Clinic Note   Patient Name: Karen West Date of Encounter: 09/24/2019  Primary Care Provider:  Deland Pretty, MD Primary Cardiologist:  Minus Breeding, MD  Patient Profile    Karen West 79 year old female presents today for follow-up of her NSTEMI on 07/03/2019, and recent hospitalization for rhabdomyolysis.  Past Medical History    Past Medical History:  Diagnosis Date  . Atherosclerosis of aorta ([place])    a. 01/2017/03/2017 - noted on high res chest CTs.  . Breast cancer ([place])    a. Bilateral --> s/p left mastectomy  . Carotid artery disease ([place])    a. 07/6312 w/ 97-02% LICA stenosis and <63% RICA stenosis; b. 10/2015 Carotid U/S: < 50% BICA stenosis  . Chest pain    a. 09/2011 MV: EF 68%, no ischemia/infarct.  . Chronic anemia   . Chronic headaches    denies  . Coronary artery calcification seen on CT scan    a. 01/2017 High res CT: atherosclerotic calcification of the arterial vascularture, including severe involvement of the coronary arteries; b. 03/2017 CT Chest: coronary and Ao atheroscelrosis.  Marland Kitchen GERD (gastroesophageal reflux disease)   . History of echocardiogram    a. 09/2011 Echo: EF 55-60%, no rwma, triv AI, PASP 62mmHg.  Marland Kitchen Hyperlipidemia   . Hypertension   . Hyperthyroidism   . Left upper lobe pulmonary nodule    a. 02/2017 PET: slowing enlarging 1.7cm LUL nodule w/ low-grade metabolic activity; b. 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 05/2017 s/p VATS.  . Multinodular goiter    a. 02/2017 PET scan- Hypermetabolic nodule;  b. 07/275 s/p thyroidectomy  . Obesity   . OSA on CPAP    cpap  . Osteoarthritis   . Personal history of radiation therapy 1999  . Right bundle branch block   . Type II or unspecified type diabetes mellitus without mention of complication, uncontrolled    Past Surgical History:  Procedure Laterality Date  . BREAST BIOPSY  1993; 1995; 2000   left; right; left  . BREAST EXCISIONAL BIOPSY Right pt unsure  .  CORONARY STENT INTERVENTION N/A 07/03/2019   Procedure: CORONARY STENT INTERVENTION;  Surgeon: Troy Sine, MD;  Location: [facility];  Service: Cardiovascular;  Laterality: N/A;  RCA  . LEFT HEART CATH AND CORONARY ANGIOGRAPHY N/A 07/03/2019   Procedure: LEFT HEART CATH AND CORONARY ANGIOGRAPHY;  Surgeon: Troy Sine, MD;  Location: [facility];  Service: Cardiovascular;  Laterality: N/A;  . MASTECTOMY Left 2000   left  . THYROIDECTOMY  05/10/2017   VIDEO BRONCHOSCOPY WITH ENDOBRONCHIAL NAVIGATION (N/A)  . THYROIDECTOMY N/A 05/10/2017   Procedure: TOTAL THYROIDECTOMY;  Surgeon: Armandina Gemma, MD;  Location: [facility];  Service: General;  Laterality: N/A;  . VIDEO ASSISTED THORACOSCOPY (VATS)/ LOBECTOMY Left 06/04/2017   Procedure: VIDEO ASSISTED THORACOSCOPY (VATS)/LEFT UPPER LOBECTOMY;  Surgeon: [name], MD;  Location: [facility];  Service: Thoracic;  Laterality: Left;  Marland Kitchen VIDEO BRONCHOSCOPY WITH ENDOBRONCHIAL NAVIGATION N/A 05/10/2017   Procedure: VIDEO BRONCHOSCOPY WITH ENDOBRONCHIAL NAVIGATION;  Surgeon: [name], MD;  Location: [facility] [facility];  Service: Thoracic;  Laterality: N/A;  . VIDEO BRONCHOSCOPY WITH ENDOBRONCHIAL ULTRASOUND N/A 06/04/2017   Procedure: VIDEO BRONCHOSCOPY WITH ENDOBRONCHIAL ULTRASOUND;  Surgeon: [name], MD;  Location: [facility];  Service: Thoracic;  Laterality: N/A;    Allergies  Allergies  Allergen Reactions  . Tramadol Nausea And Vomiting and Other (See Comments)    "got sick to my stomach and was  throwing up blood; it put me in the hospital")  . Lisinopril Rash and Other (See Comments)    Renal failure   . Tapazole [Methimazole] Itching and Rash    History of Present Illness    Ms. 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 She presented to her PCP for evaluation of 2 weeks of intermittent chest pain. Her CP was worse with exertion and had associated shortness of breath. During her PCP appointment she had a recurrent episode of chest pain and was sent to the emergency department. In the emergency department she was given aspirin and found to have HStroponin of 26 with repeat of 52. Her CXR showed no acute cardiopulmonary disease with mitral annulus calcification as well as aortic atherosclerosis. EKG showed new complete RBBB with more pronounced DWI in leads V1, V3, and III. She is also noted to have dependent lower extremity edema. She was started on IV heparin at that time and admitted. She was taken to the Cath Lab on 07/03/2019 and was found to have a proximal-mid LAD lesion 70%, proximal LAD lesion 40%, mid RCA to distal RCA lesion 80%, DES successfully placed, distal RCA 95% DES was successfully placed. Femoral approach was used. Total DES x4 to RCA.  She presented the clinic 07/14/2019 for follow-up evaluationwith her sisterand stated she was feeling better than she did prior to her cardiac catheterization. She had been walking for 5 minutes every other day. She was ready to do more around her house and started driving. She was interested in further prevention/risk reduction. We talked about the importance of medication compliance, diet, exercise, and keeping follow-up appointments. She stated that her blood pressure was slightly elevated at home running between 140s and 130s. I will increased her metoprolol to 50 mg twice daily, repeated a BMP due to her AKI, and had her follow-up in 1 month for reevaluation.  She presented to the clinic 08/19/2019 for follow-up evaluation and stated she has had loose stools for 1 to 2 weeks.  They recently subsided over the  last 1 to 2 days.  She stated she was taking magnesium and had stopped the medication.  Her stools had returned to normal.  She stated that she did not feel back to normal yet but continued to hydrate and eat small bites of food.  She had an appointment with her PCP on June 22.  I will gave her instructions for the brat diet, encourage her to continue p.o. hydration, and planned her return in 1 month for reevaluation.  She was admitted to the hospital 08/22/2018 -6 /8/21 with diarrhea bilateral leg tenderness, and abnormal outpatient blood work.  Her AST was elevated at 284, ALT 525, normal bilirubin and creatinine 1.88 which had continued to improve.  She received 500 cc of saline emergency department.  She was diagnosed with rhabdomyolysis.  Her rosuvastatin was held at that time.  Creatinine improved with IV hydration.  Recommendations were given to continue to hold statin until told to resume future.  She presents to the clinic today for follow-up evaluation and states states she is feeling somewhat better today.  She presented to the emergency department and her Crestor was stopped.  She continues to have bilateral lower extremity  soreness.  She has not increased her physical activity but does do 10 minutes of walking most days of the week.  Due to her lower extremity soreness I will continue to hold her statin.  I will have her follow-up in 1 month for reevaluation and repeat lab work.  I will also refer her to Dr. Hilty/lipid clinic for further evaluation.  Plan to start Lipitor 40 mg daily at next visit.  Today shedenies chest pain, shortness of breath, lower extremity edema, fatigue, palpitations, melena, hematuria, hemoptysis, diaphoresis, weakness, presyncope, syncope, orthopnea, and PND.  Home Medications    Prior to Admission medications   Medication Sig Start Date End Date Taking? Authorizing Provider  acetaminophen (TYLENOL) 500 MG tablet Take 1,000 mg by mouth every 6 (six) hours as  needed for moderate pain or headache.    [provider]  amLODipine (NORVASC) 10 MG tablet Take 10 mg by mouth daily.    [provider]  aspirin 81 MG tablet Take 81 mg by mouth daily.      [provider]  Bacillus Coagulans-Inulin (PROBIOTIC) 1-250 BILLION-MG CAPS Take 1 capsule by mouth daily. 08/15/19   Wieters, Hallie C, PA-C  Cobalamin Combinations (B-12) [phone] MCG SUBL Place 1 tablet under the tongue daily.     [provider]  ezetimibe (ZETIA) 10 MG tablet Take 10 mg by mouth every evening.     [provider]  hydrocortisone cream 1 % Apply 1 application topically 2 (two) times daily as needed for itching.     [provider]  levothyroxine (SYNTHROID, LEVOTHROID) 88 MCG tablet Take 88 mcg by mouth daily. 07/02/17   [provider]  metoprolol tartrate (LOPRESSOR) 50 MG tablet Take 0.5 tablets (25 mg total) by mouth 2 (two) times daily. 08/19/19   Deberah Pelton, NP  Omega-3 Fatty Acids (FISH OIL) 1200 MG CAPS Take 1,200 mg by mouth 2 (two) times daily.     [provider]  omeprazole (PRILOSEC) 20 MG capsule Take 20 mg by mouth 2 (two) times daily. Takes prn    [provider]  sitaGLIPtin (JANUVIA) 50 MG tablet Take 1 tablet (50 mg total) by mouth daily. 08/26/19 09/25/19  Donne Hazel, MD  ticagrelor (BRILINTA) 90 MG TABS tablet Take 1 tablet (90 mg total) by mouth 2 (two) times daily. 08/04/19   Deberah Pelton, NP    Family History    Family History  Problem Relation Age of Onset  . Diabetes Brother        x3  . Hypertension Brother        x3  . Diabetes Brother   . Stroke Brother   . Heart attack Mother 35       Mother Died of MI age 73  . Diabetes Sister   . Stroke Sister    She indicated that her mother is deceased. She indicated that her father is deceased. She indicated that two of her four brothers are alive.  Social History    Social History   Socioeconomic History  . Marital  status: Widowed    Spouse name: Not on file  . Number of children: Not on file  . Years of education: Not on file  . Highest education level: Not on file  Occupational History  . Occupation: retired    Fish farm manager: RETIRED    Comment: accountant  Tobacco Use  . Smoking status: Never Smoker  . Smokeless tobacco: Never Used  Vaping Use  .  Vaping Use: Never used  Substance and Sexual Activity  . Alcohol use: Yes    Comment: occ  . Drug use: No  . Sexual activity: Yes    Partners: Male    Birth control/protection: None  Other Topics Concern  . Not on file  Social History Narrative   Lives alone.  Four children.    Social Determinants of Health   Financial Resource Strain:   . Difficulty of Paying Living Expenses:   Food Insecurity: No Food Insecurity  . Worried About Charity fundraiser in the Last Year: Never true  . Ran Out of Food in the Last Year: Never true  Transportation Needs: No Transportation Needs  . Lack of Transportation (Medical): No  . Lack of Transportation (Non-Medical): No  Physical Activity:   . Days of Exercise per Week:   . Minutes of Exercise per Session:   Stress:   . Feeling of Stress :   Social Connections:   . Frequency of Communication with Friends and Family:   . Frequency of Social Gatherings with Friends and Family:   . Attends Religious Services:   . Active Member of Clubs or Organizations:   . Attends Archivist Meetings:   Marland Kitchen Marital Status:   Intimate Partner Violence: Not At Risk  . Fear of Current or Ex-Partner: No  . Emotionally Abused: No  . Physically Abused: No  . Sexually Abused: No     Review of Systems    General:  No chills, fever, night sweats or weight changes.  Cardiovascular:  No chest pain, dyspnea on exertion, edema, orthopnea, palpitations, paroxysmal nocturnal dyspnea. Dermatological: No rash, lesions/masses Respiratory: No cough, dyspnea Urologic: No hematuria, dysuria Abdominal:   No nausea,  vomiting, diarrhea, bright red blood per rectum, melena, or hematemesis Neurologic:  No visual changes, wkns, changes in mental status. All other systems reviewed and are otherwise negative except as noted above.  Physical Exam    VS:  BP 134/66   Pulse 69   Ht 5\' 2"  (1.575 m)   Wt 142 lb (64.4 kg)   SpO2 99%   BMI 25.97 kg/m  , BMI Body mass index is 25.97 kg/m. GEN: Well nourished, well developed, in no acute distress. HEENT: normal. Neck: Supple, no JVD, carotid bruits, or masses. Cardiac: RRR, no murmurs, rubs, or gallops. No clubbing, cyanosis, edema.  Radials/DP/PT 2+ and equal bilaterally.  Respiratory:  Respirations regular and unlabored, clear to auscultation bilaterally. GI: Soft, nontender, nondistended, BS + x 4. MS: no deformity or atrophy. Skin: warm and dry, no rash. Neuro:  Strength and sensation are intact. Psych: Normal affect.  Accessory Clinical Findings    ECG personally reviewed by me today-none today.  EKG 08/19/2019 sinus rhythm with PACs incomplete right bundle branch block prolonged QT 87 bpm  EKG 07/14/2019 normal sinus rhythm possible left atrial enlargement no ST or T wave deviation 79 bpm  Echocardiogram 06/23/2019 1. Left ventricular ejection fraction, by estimation, is 65 to 70%. The  left ventricle has normal function. The left ventricle has no regional  wall motion abnormalities. Left ventricular diastolic parameters are  consistent with Grade I diastolic  dysfunction (impaired relaxation). Elevated left ventricular end-diastolic  pressure.  2. Right ventricular systolic function is normal. The right ventricular  size is normal.  3. The mitral valve is normal in structure. Moderate mitral valve  regurgitation. No evidence of mitral stenosis.  4. Tricuspid valve regurgitation is mild to moderate.  5. The aortic  valve is tricuspid. Aortic valve regurgitation is moderate.  Mild aortic valve stenosis. Aortic valve mean gradient measures  7.0 mmHg.  6. The inferior vena cava is normal in size with greater than 50%  respiratory variability, suggesting right atrial pressure of 3 mmHg.  Cardiac catheterization 07/03/2019  Cardiac Catheterization 07/03/19  Prox LAD to Mid LAD lesion is 70% stenosed.  Prox LAD lesion is 40% stenosed.  Mid RCA to Dist RCA lesion is 80% stenosed.  A stent was successfully placed.  Dist RCA lesion is 95% stenosed.  Post intervention, there is a 0% residual stenosis.  Post intervention, there is a 0% residual stenosis.  There is severe mitral annular calcification.   The LAD has moderate diffuse calcification with 40 followed by 70% proximal stenosis prior to the takeoff of the first diagonal vessel with 20% mid stenosis.  Left circumflex vessel is tortuous and free of significant obstructive disease.  The RCA is a dominant vessel that had 80% diffuse stenosis in the region of the acute margin followed by 95% focal distal stenosis.  Transient 2-1/complete heart block with insertion of catheter into the LV with spontaneous resolution.  PCI performed via the femoral approach after transitioning from the radial approach which was associated with significant radial artery spasm.  Very difficult PCI with very difficult catheter backup with initial PTCA of the distal and acute margin stenoses with subsequent development of guiding catheter induced very proximal RCA dissection extending to the acute margin. Utilizing telescope guidliner support from distal to near ostial for Resolute Onyx DES stents were inserted with the most distal stent being a 2.5 x 18 mm stent followed by 2.5 x 22 mm stent, 2.5 x 38 mm stent and the most proximal stent a 2.75 x 18 mm Resolute Onyx stent with an excellent angiographic result with brisk TIMI-3 flow and no evidence for residual dissection.  RECOMMENDATION: Patient will be hydrated post procedure. Continue DAPT for minimum of 12 months. Initiate  medical therapy for ischemic disease. May need future CSI atherectomy to the LAD lesion which is significantly calcified. Aggressive lipid-lowering therapy.  Coronary Diagrams  Diagnostic Dominance: Right  Intervention      Assessment & Plan   1.  Rhabdomyolysis- Bilateral lower extremity muscle soreness, body aches, or activity intolerance.  Diarrhea has resolved.  Believed to be related to increased rosuvastatin dosing post NSTEMI.  Rosuvastatin held with hospital admission 08/22/2018 1-6 /8/21 AST was elevated at 284, ALT 525, normal bilirubin and creatinine 1.88 Heart healthy low-sodium diet-salty 6 given Increase physical activity as tolerated Followed by PCP, bring BMP results in one month Continue to hold statin at this time   Essential hypertension-BP today 134/66. Had previously been [id number] systolic at home. Continue amlodipine Continue metoprolol to 50 mg twice daily Heart healthy low-sodium diet-salty 6 given Increase physical activity as tolerated  NSTEMI-heart rate today 69 bpm.Received DES x4 to his RCA. LAD with 40% and 70% calcified stenosis. DAPT aspirin and Brilinta for 12 months prescribed. Echocardiogram showed an EF of 65-70%, no WMA, G1 DD, moderate MR, mild to moderate TR, moderate AR. Continue aspirin Continue Brilinta-assistance sheet given Continue statin Continue amlodipine Continuemetoprolol50 mg twice daily Continue cardiac rehab Heart healthy low-sodium diet-salty 6 given Increase physical activity as tolerated  Hyperlipidemia-07/03/2019: Cholesterol 121; HDL 37; LDL Cholesterol 61; Triglycerides 113; VLDL 23 Continue to hold statin due to bilateral lower extremity muscle soreness. Heart healthy low-sodium high-fiber diet Increase physical activity as tolerated Refer to Dr. Wonda Horner clinic  Acute kidney injury-creatinine at discharge 1.36 07/05/19. On follow-up creatinine 1.53 07/11/19 Continue p.o. hydration Monitored by  PCP  Disposition: Follow-up with me in 1 month.    Jossie Ng. Lounette Sloan NP-C    09/24/2019, 10:26 AM [address] Office [phone] Fax [phone]

## 2019-09-24 ENCOUNTER — Other Ambulatory Visit: Payer: Self-pay

## 2019-09-24 ENCOUNTER — Ambulatory Visit (INDEPENDENT_AMBULATORY_CARE_PROVIDER_SITE_OTHER): Payer: Medicare Other | Admitting: General Practice

## 2019-09-24 ENCOUNTER — Encounter: Payer: Self-pay | Admitting: General Practice

## 2019-09-24 VITALS — BP 134/66 | HR 69 | Ht 62.0 in | Wt 142.0 lb

## 2019-09-24 DIAGNOSIS — I214 Non-ST elevation (NSTEMI) myocardial infarction: Secondary | ICD-10-CM | POA: Diagnosis not present

## 2019-09-24 DIAGNOSIS — E7800 Pure hypercholesterolemia, unspecified: Secondary | ICD-10-CM

## 2019-09-24 DIAGNOSIS — I10 Essential (primary) hypertension: Secondary | ICD-10-CM | POA: Diagnosis not present

## 2019-09-24 DIAGNOSIS — N179 Acute kidney failure, unspecified: Secondary | ICD-10-CM

## 2019-09-24 DIAGNOSIS — M6282 Rhabdomyolysis: Secondary | ICD-10-CM

## 2019-09-24 NOTE — Patient Instructions (Signed)
Medication Instructions:  HOLD ROSUVASTATIN  *If you need a refill on your cardiac medications before your next appointment, please call your pharmacy*  Special Instructions BRING LAB RESULTS FROM PCP AT FOLLOW UP APPOINTMENT  PLEASE READ AND FOLLOW SALTY 6-ATTACHED  PLEASE CONTINUE PHYSICAL ACTIVITY AS TOLERATED  Follow-Up: Your next appointment:  1 month(s)  In Person with [place], FNP-C  PLEASE SCHEDULE 1 ST AVAILABLE APPOINTMENT WITH DR [facility]ILTY-LIPID CLIN[facility]  At [facility], you and your health needs are our priority.  As part of our continuing mission to provide you with exceptional heart care, we have created designated Provider Care Teams.  These Care Teams include your primary Cardiologist (physician) and Advanced Practice Providers (APPs -  Physician Assistants and Nurse Practitioners) who all work together to provide you with the care you need, when you need it.

## 2019-09-29 ENCOUNTER — Telehealth (HOSPITAL_COMMUNITY): Payer: Self-pay

## 2019-09-29 NOTE — Telephone Encounter (Signed)
Called patient to see if she was interested in participating in the Cardiac Rehab Program. Patient stated yes. Patient will come in for orientation on 10/28/19 @ 8AM and will attend the 10:45AM exercise class.  Mailed letter

## 2019-10-01 ENCOUNTER — Other Ambulatory Visit: Payer: Self-pay | Admitting: Urology

## 2019-10-01 DIAGNOSIS — K862 Cyst of pancreas: Secondary | ICD-10-CM

## 2019-10-08 ENCOUNTER — Encounter: Payer: Self-pay | Admitting: Internal Medicine

## 2019-10-08 ENCOUNTER — Ambulatory Visit (INDEPENDENT_AMBULATORY_CARE_PROVIDER_SITE_OTHER): Payer: Medicare Other | Admitting: Internal Medicine

## 2019-10-08 ENCOUNTER — Ambulatory Visit: Payer: Medicare Other

## 2019-10-08 ENCOUNTER — Other Ambulatory Visit: Payer: Self-pay

## 2019-10-08 VITALS — BP 132/60 | HR 71 | Ht 62.0 in | Wt 146.0 lb

## 2019-10-08 DIAGNOSIS — I779 Disorder of arteries and arterioles, unspecified: Secondary | ICD-10-CM | POA: Diagnosis not present

## 2019-10-08 DIAGNOSIS — E7801 Familial hypercholesterolemia: Secondary | ICD-10-CM

## 2019-10-08 DIAGNOSIS — Z8739 Personal history of other diseases of the musculoskeletal system and connective tissue: Secondary | ICD-10-CM | POA: Diagnosis not present

## 2019-10-08 DIAGNOSIS — E890 Postprocedural hypothyroidism: Secondary | ICD-10-CM | POA: Diagnosis not present

## 2019-10-08 DIAGNOSIS — I252 Old myocardial infarction: Secondary | ICD-10-CM | POA: Diagnosis not present

## 2019-10-08 DIAGNOSIS — M2042 Other hammer toe(s) (acquired), left foot: Secondary | ICD-10-CM | POA: Diagnosis not present

## 2019-10-08 DIAGNOSIS — E1121 Type 2 diabetes mellitus with diabetic nephropathy: Secondary | ICD-10-CM | POA: Diagnosis not present

## 2019-10-08 DIAGNOSIS — I2510 Atherosclerotic heart disease of native coronary artery without angina pectoris: Secondary | ICD-10-CM | POA: Diagnosis not present

## 2019-10-08 DIAGNOSIS — E1140 Type 2 diabetes mellitus with diabetic neuropathy, unspecified: Secondary | ICD-10-CM | POA: Diagnosis not present

## 2019-10-08 DIAGNOSIS — M2012 Hallux valgus (acquired), left foot: Secondary | ICD-10-CM | POA: Diagnosis not present

## 2019-10-08 DIAGNOSIS — M2011 Hallux valgus (acquired), right foot: Secondary | ICD-10-CM | POA: Diagnosis not present

## 2019-10-08 DIAGNOSIS — M2041 Other hammer toe(s) (acquired), right foot: Secondary | ICD-10-CM | POA: Diagnosis not present

## 2019-10-08 DIAGNOSIS — G720 Drug-induced myopathy: Secondary | ICD-10-CM

## 2019-10-08 DIAGNOSIS — T466X5A Adverse effect of antihyperlipidemic and antiarteriosclerotic drugs, initial encounter: Secondary | ICD-10-CM | POA: Diagnosis not present

## 2019-10-08 DIAGNOSIS — N289 Disorder of kidney and ureter, unspecified: Secondary | ICD-10-CM | POA: Diagnosis not present

## 2019-10-08 DIAGNOSIS — D649 Anemia, unspecified: Secondary | ICD-10-CM | POA: Diagnosis not present

## 2019-10-08 DIAGNOSIS — I10 Essential (primary) hypertension: Secondary | ICD-10-CM | POA: Diagnosis not present

## 2019-10-08 NOTE — Progress Notes (Signed)
LIPID CLINIC CONSULT NOTE  Chief Complaint:  Manage dyslipidemia  Primary Care Physician: Deland Pretty, MD  Primary Cardiologist:  Minus Breeding, MD  HPI:  Karen West is a 79 y.o. female who is being seen today for the evaluation of dyslipidemia at the request of Coletta Memos, NP.  This is a 79 year old female kindly referred for management of dyslipidemia.  Ms. Mogle unfortunately coronary artery disease and had PCI in April 2021 to the right coronary artery.  Despite a difficult procedure she is done pretty well with this.  At that time she was ready on 20 mg of rosuvastatin and that was increased up to 40 mg.  Recently however though she was hospitalized with diarrhea, dehydration, markedly elevated liver enzymes and high CK which is suggestive of a rhabdomyolysis.  She had significant bilateral leg pain and weakness with CKs in the thousands.  Subsequently they have improved and labs from her PCP today show normal values with a CK of 62 and an AST and ALT of 15 and 18 respectively.  Her last lipid profile showed total cholesterol 158, triglycerides 109, HDL 54 and LDL of 82.  Subsequently cholesterol was retested in June 2021 which showed total cholesterol 275, triglycerides 135, HDL 51 and LDL of 199.  Findings are very significant and suggestive of familial hyperlipidemia.  This would be consistent with her aggressive coronary artery disease and bilateral carotid artery disease as well as atherosclerosis of the aorta.  PMHx:  Past Medical History:  Diagnosis Date  . Atherosclerosis of aorta ([place])    a. 01/2017/03/2017 - noted on high res chest CTs.  . Breast cancer ([place])    a. Bilateral --> s/p left mastectomy  . Carotid artery disease ([place] [place])    a. 06/4032 w/ 74-25% LICA stenosis and <95% RICA stenosis; b. 10/2015 Carotid U/S: < 50% BICA stenosis  . Chest pain    a. 09/2011 MV: EF 68%, no ischemia/infarct.  . Chronic anemia   . Chronic headaches    denies  . Coronary  artery calcification seen on CT scan    a. 01/2017 High res CT: atherosclerotic calcification of the arterial vascularture, including severe involvement of the coronary arteries; b. 03/2017 CT Chest: coronary and Ao atheroscelrosis.  Marland Kitchen GERD (gastroesophageal reflux disease)   . History of echocardiogram    a. 09/2011 Echo: EF 55-60%, no rwma, triv AI, PASP 27mmHg.  Marland Kitchen Hyperlipidemia   . Hypertension   . Hyperthyroidism   . Left upper lobe pulmonary nodule    a. 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 05/2017 s/p VATS.  . Multinodular goiter    a. 02/2017 PET scan- Hypermetabolic nodule;  b. 08/4330 s/p thyroidectomy  . Obesity   . OSA on CPAP    cpap  . Osteoarthritis   . Personal history of radiation therapy 1999  . Right bundle branch block   . Type II or unspecified type diabetes mellitus without mention of complication, uncontrolled     Past Surgical History:  Procedure Laterality Date  . BREAST BIOPSY  1993; 1995; 2000   left; right; left  . BREAST EXCISIONAL BIOPSY Right pt unsure  . CORONARY STENT INTERVENTION N/A 07/03/2019   Procedure: CORONARY STENT INTERVENTION;  Surgeon: Troy Sine, MD;  Location: [place] [facility];  Service: Cardiovascular;  Laterality: N/A;  RCA  . LEFT HEART CATH AND CORONARY ANGIOGRAPHY N/A 07/03/2019   Procedure: LEFT HEART  CATH AND CORONARY ANGIOGRAPHY;  Surgeon: Troy Sine, MD;  Location: [facility];  Service: Cardiovascular;  Laterality: N/A;  . MASTECTOMY Left 2000   left  . THYROIDECTOMY  05/10/2017   VIDEO BRONCHOSCOPY WITH ENDOBRONCHIAL NAVIGATION (N/A)  . THYROIDECTOMY N/A 05/10/2017   Procedure: TOTAL THYROIDECTOMY;  Surgeon: Armandina Gemma, MD;  Location: [place];  Service: General;  Laterality: N/A;  . VIDEO ASSISTED THORACOSCOPY (VATS)/ LOBECTOMY Left 06/04/2017   Procedure: VIDEO ASSISTED THORACOSCOPY (VATS)/LEFT UPPER LOBECTOMY;  Surgeon:  [name], MD;  Location: [facility];  Service: Thoracic;  Laterality: Left;  Marland Kitchen VIDEO BRONCHOSCOPY WITH ENDOBRONCHIAL NAVIGATION N/A 05/10/2017   Procedure: VIDEO BRONCHOSCOPY WITH ENDOBRONCHIAL NAVIGATION;  Surgeon: [name], MD;  Location: [facility];  Service: Thoracic;  Laterality: N/A;  . VIDEO BRONCHOSCOPY WITH ENDOBRONCHIAL ULTRASOUND N/A 06/04/2017   Procedure: VIDEO BRONCHOSCOPY WITH ENDOBRONCHIAL ULTRASOUND;  Surgeon: [name], MD;  Location: [facility];  Service: Thoracic;  Laterality: N/A;    FAMHx:  Family History  Problem Relation Age of Onset  . Diabetes Brother        x3  . Hypertension Brother        x3  . Diabetes Brother   . Stroke Brother   . Heart attack Mother 47       Mother Died of MI age 77  . Diabetes Sister   . Stroke Sister     SOCHx:   reports that she has never smoked. She has never used smokeless tobacco. She reports current alcohol use. She reports that she does not use drugs.  ALLERGIES:  Allergies  Allergen Reactions  . Tramadol Nausea And Vomiting and Other (See Comments)    "got sick to my stomach and was throwing up blood; it put me in the hospital")  . Lisinopril Rash and Other (See Comments)    Renal failure   . Tapazole [Methimazole] Itching and Rash    ROS: Pertinent items noted in HPI and remainder of comprehensive ROS otherwise negative.  HOME MEDS: Current Outpatient Medications on File Prior to Visit  Medication Sig Dispense Refill  . acetaminophen (TYLENOL) 500 MG tablet Take 1,000 mg by mouth every 6 (six) hours as needed for moderate pain or headache.    Marland Kitchen amLODipine (NORVASC) 10 MG tablet Take 10 mg by mouth daily.    Marland Kitchen aspirin 81 MG tablet Take 81 mg by mouth daily.      . Bacillus Coagulans-Inulin (PROBIOTIC) 1-250 BILLION-MG CAPS Take 1 capsule by mouth daily. 30 capsule 0  . Cobalamin Combinations (B-12) [phone] MCG SUBL Place 1 tablet under the tongue daily.     . Empagliflozin-linaGLIPtin  (GLYXAMBI) 10-5 MG TABS Take by mouth.    . hydrocortisone cream 1 % Apply 1 application topically 2 (two) times daily as needed for itching.     . levothyroxine (SYNTHROID, LEVOTHROID) 88 MCG tablet Take 88 mcg by mouth daily.  5  . metoprolol tartrate (LOPRESSOR) 50 MG tablet Take 0.5 tablets (25 mg total) by mouth 2 (two) times daily. 90 tablet 3  . Omega-3 Fatty Acids (FISH OIL) 1200 MG CAPS Take 1,200 mg by mouth 2 (two) times daily.     Marland Kitchen omeprazole (PRILOSEC) 20 MG capsule Take 20 mg by mouth 2 (two) times daily. Takes prn    . ticagrelor (BRILINTA) 90 MG TABS tablet Take 1 tablet (90 mg total) by mouth 2 (two) times daily. 180 tablet 3   No current facility-administered medications on  file prior to visit.    LABS/IMAGING: No results found for this or any previous visit (from the past 48 hour(s)). No results found.  LIPID PANEL:    Component Value Date/Time   CHOL 121 07/03/2019 0051   TRIG 113 07/03/2019 0051   HDL 37 (L) 07/03/2019 0051   CHOLHDL 3.3 07/03/2019 0051   VLDL 23 07/03/2019 0051   LDLCALC 61 07/03/2019 0051    WEIGHTS: Wt Readings from Last 3 Encounters:  10/08/19 146 lb (66.2 kg)  09/24/19 142 lb (64.4 kg)  08/25/19 147 lb 7.8 oz (66.9 kg)    VITALS: BP 132/60   Pulse 71   Ht 5\' 2"  (1.575 m)   Wt 146 lb (66.2 kg)   SpO2 100%   BMI 26.70 kg/m   EXAM: Deferred  EKG: Deferred  ASSESSMENT: 1. Probable familial hyperlipidemia, LDL greater than 190 off therapy 2. Coronary artery disease with prior PCI 3. Bilateral carotid artery disease 4. Aortic atherosclerosis 5. Type 2 diabetes 6. Hypertension 7. OSA on CPAP  PLAN: 1.   Ms. Venturini has probable familial hyperlipidemia based on untreated LDL cholesterol greater than 190 and aggressive coronary artery disease.  Although she had good control of her cholesterol on statins, she did suffer from rhabdomyolysis possibly related to the statin.  She also had markedly elevated liver enzymes which  have all resolved.  I am very hesitant in this situation to recommend a retrial of statin therapy and would recommend switching her to a PCSK9 inhibitor.  We will need to repeat liver enzymes in about a month and then repeat her lipids in about 3 months after starting therapy.  Thanks again for the kind referral.  Pixie Casino, MD, FACC, [place] Director of the Advanced Lipid Disorders &  Cardiovascular Risk Reduction Clinic Diplomate of the American Board of Clinical Lipidology Attending Cardiologist  Direct Dial: [phone]  Fax: [phone]  Website:  [email or web address] Brannick 10/08/2019, 1:04 PM

## 2019-10-08 NOTE — Patient Instructions (Signed)
Medication Instructions:  CONTINUE ZETIA  Dr. Debara Pickett recommends [place] or Praluent (PCSK9). This is an injectable cholesterol medication self-administered once every 14 days. This medication will likely need prior approval with your insurance company, which we will work on. If the medication is not approved initially, we may need to do an appeal with your insurance. We will keep you updated on this process.   Administer medication in area of fatty tissue such as abdomen, outer thigh, back up of arm - and rotate site with each injection Store medication in refrigerator until ready to administer - allow to sit at room temp for 30 mins - 1 hour prior to injection Dispose of medication in a SHARPS container - your pharmacy should be able to direct you on this and proper disposal   If you need co-pay assistance grant, please look into the program at [email or web address] >> disease funds >> hypercholesterolemia. This is an online application or you can call to complete. Once approved, you will provide the "pharmacy card" information to your pharmacy and they will deduct the co-pays from this grant.  *If you need a refill on your cardiac medications before your next appointment, please call your pharmacy*   Lab Work: LIVER FUNCTION TEST and CK in 1 month  FASTING LIPID PANEL in 3-4 months (complete 1 week before follow up with Dr. Debara Pickett)  If you have labs (blood work) drawn today and your tests are completely normal, you will receive your results only by: Marland Kitchen MyChart Message (if you have MyChart) OR . A paper copy in the mail If you have any lab test that is abnormal or we need to change your treatment, we will call you to review the results.   Testing/Procedures: NONE   Follow-Up: At [facility], you and your health needs are our priority.  As part of our continuing mission to provide you with exceptional heart care, we have created designated Provider Care Teams.  These Care Teams  include your primary Cardiologist (physician) and Advanced Practice Providers (APPs -  Physician Assistants and Nurse Practitioners) who all work together to provide you with the care you need, when you need it.  We recommend signing up for the patient portal called "MyChart".  Sign up information is provided on this After Visit Summary.  MyChart is used to connect with patients for Virtual Visits (Telemedicine).  Patients are able to view lab/test results, encounter notes, upcoming appointments, etc.  Non-urgent messages can be sent to your provider as well.   To learn more about what you can do with MyChart, go to [email or web address].    Your next appointment:   3-4 month(s) - lipid clinic  The format for your next appointment:   In Person  Provider:   K. Mali Hilty, MD   Other Instructions

## 2019-10-09 ENCOUNTER — Telehealth: Payer: Self-pay | Admitting: Internal Medicine

## 2019-10-09 NOTE — Telephone Encounter (Signed)
PA for praluent submitted via CMM - preferred with [facility] (Key: [id number])  ID: [id number] BIN: [id number] PCN: [id number] RxGrp: [id number]

## 2019-10-13 ENCOUNTER — Telehealth (HOSPITAL_COMMUNITY): Payer: Self-pay

## 2019-10-13 NOTE — Telephone Encounter (Signed)
Cardiac Rehab Medication Review by a Pharmacist  Does the patient  feel that his/her medications are working for him/her?  yes  Has the patient been experiencing any side effects to the medications prescribed?  no  Does the patient measure his/her own blood pressure or blood glucose at home?  yes   Does the patient have any problems obtaining medications due to transportation or finances?   no  Understanding of regimen: excellent Understanding of indications: excellent Potential of compliance: excellent    Pharmacist Intervention: [place], PharmD PGY-1 Acute Care Pharmacy Resident Office: [phone] 10/13/2019 2:35 PM

## 2019-10-13 NOTE — Telephone Encounter (Signed)
PA for Praluent approved from 10/09/2019 - "until further notice" from [facility]

## 2019-10-14 ENCOUNTER — Telehealth (HOSPITAL_COMMUNITY): Payer: Self-pay

## 2019-10-14 MED ORDER — PRALUENT 150 MG/ML ~~LOC~~ SOAJ: 1.0000 | SUBCUTANEOUS | 11 refills | Status: DC

## 2019-10-14 NOTE — Addendum Note (Signed)
Addended by: Fidel Levy on: 10/14/2019 04:29 PM   Modules accepted: Orders

## 2019-10-14 NOTE — Telephone Encounter (Signed)
MyChart message sent to patient notifying her med is approved, Rx sent to [facility], advised on healthwell grant and/or praluent assistance application

## 2019-10-15 ENCOUNTER — Ambulatory Visit: Payer: Medicare Other | Admitting: Orthotics

## 2019-10-15 ENCOUNTER — Other Ambulatory Visit: Payer: Self-pay

## 2019-10-15 DIAGNOSIS — E0840 Diabetes mellitus due to underlying condition with diabetic neuropathy, unspecified: Secondary | ICD-10-CM

## 2019-10-15 DIAGNOSIS — B351 Tinea unguium: Secondary | ICD-10-CM

## 2019-10-15 DIAGNOSIS — M792 Neuralgia and neuritis, unspecified: Secondary | ICD-10-CM

## 2019-10-15 NOTE — Progress Notes (Signed)
Shaved off some of the metatarsala area on the bottom of f/o to make softer as she was complaining about redness on plantar surface of foot.

## 2019-10-16 ENCOUNTER — Telehealth: Payer: Self-pay | Admitting: Podiatry

## 2019-10-16 NOTE — Telephone Encounter (Signed)
Pt left message on 7.26.2021 stating the shoes she got were causing red places (Blisters) on her feet. She stated her feet were bothering her previously and it is worse with the shoes.  Upon looking pt did call back and was scheduled to see Liliane Channel 7.28.2021.  I still returned call to confirm pt was taken care of and she was and thanked me for calling.

## 2019-10-27 NOTE — Progress Notes (Signed)
Cardiology Clinic Note   Patient Name: Karen West Date of Encounter: 10/28/2019  Primary Care Provider:  Deland Pretty, MD Primary Cardiologist:  Minus Breeding, MD  Patient Profile    Karen West 79 year old female presents today for follow-up of her NSTEMI on 07/03/2019, and recent  rhabdomyolysis.  Past Medical History    Past Medical History:  Diagnosis Date  . Atherosclerosis of aorta ([place])    a. 01/2017/03/2017 - noted on high res chest CTs.  . Breast cancer ([place])    a. Bilateral --> s/p left mastectomy  . Carotid artery disease ([place])    a. 04/5425 w/ 06-23% LICA stenosis and <76% RICA stenosis; b. 10/2015 Carotid U/S: < 50% BICA stenosis  . Chest pain    a. 09/2011 MV: EF 68%, no ischemia/infarct.  . Chronic anemia   . Chronic headaches    denies  . Coronary artery calcification seen on CT scan    a. 01/2017 High res CT: atherosclerotic calcification of the arterial vascularture, including severe involvement of the coronary arteries; b. 03/2017 CT Chest: coronary and Ao atheroscelrosis.  Marland Kitchen GERD (gastroesophageal reflux disease)   . History of echocardiogram    a. 09/2011 Echo: EF 55-60%, no rwma, triv AI, PASP 57mmHg.  Marland Kitchen Hyperlipidemia   . Hypertension   . Hyperthyroidism   . Left upper lobe pulmonary nodule    a. 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 05/2017 s/p VATS.  . Multinodular goiter    a. 02/2017 PET scan- Hypermetabolic nodule;  b. 03/7614 s/p thyroidectomy  . Obesity   . OSA on CPAP    cpap  . Osteoarthritis   . Personal history of radiation therapy 1999  . Right bundle branch block   . Type II or unspecified type diabetes mellitus without mention of complication, uncontrolled    Past Surgical History:  Procedure Laterality Date  . BREAST BIOPSY  1993; 1995; 2000   left; right; left  . BREAST EXCISIONAL BIOPSY Right pt unsure  . CORONARY STENT  INTERVENTION N/A 07/03/2019   Procedure: CORONARY STENT INTERVENTION;  Surgeon: Troy Sine, MD;  Location: [facility];  Service: Cardiovascular;  Laterality: N/A;  RCA  . LEFT HEART CATH AND CORONARY ANGIOGRAPHY N/A 07/03/2019   Procedure: LEFT HEART CATH AND CORONARY ANGIOGRAPHY;  Surgeon: Troy Sine, MD;  Location: [facility];  Service: Cardiovascular;  Laterality: N/A;  . MASTECTOMY Left 2000   left  . THYROIDECTOMY  05/10/2017   VIDEO BRONCHOSCOPY WITH ENDOBRONCHIAL NAVIGATION (N/A)  . THYROIDECTOMY N/A 05/10/2017   Procedure: TOTAL THYROIDECTOMY;  Surgeon: Armandina Gemma, MD;  Location: [facility];  Service: General;  Laterality: N/A;  . VIDEO ASSISTED THORACOSCOPY (VATS)/ LOBECTOMY Left 06/04/2017   Procedure: VIDEO ASSISTED THORACOSCOPY (VATS)/LEFT UPPER LOBECTOMY;  Surgeon: [name], MD;  Location: [facility];  Service: Thoracic;  Laterality: Left;  Marland Kitchen VIDEO BRONCHOSCOPY WITH ENDOBRONCHIAL NAVIGATION N/A 05/10/2017   Procedure: VIDEO BRONCHOSCOPY WITH ENDOBRONCHIAL NAVIGATION;  Surgeon: [name], MD;  Location: [facility];  Service: Thoracic;  Laterality: N/A;  . VIDEO BRONCHOSCOPY WITH ENDOBRONCHIAL ULTRASOUND N/A 06/04/2017   Procedure: VIDEO BRONCHOSCOPY WITH ENDOBRONCHIAL ULTRASOUND;  Surgeon: [name], MD;  Location: [facility];  Service: Thoracic;  Laterality: N/A;    Allergies  Allergies  Allergen Reactions  . Tramadol Nausea And Vomiting and Other (See Comments)    "got sick to my stomach and was throwing  up blood; it put me in the hospital")  . Lisinopril Rash and Other (See Comments)    Renal failure   . Tapazole [Methimazole] Itching and Rash    History of Present Illness    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 She presented to her PCP for evaluation of 2 weeks of intermittent chest pain. Her CP was worse with exertion and had associated shortness of breath. During her PCP appointment she had a recurrent episode of chest pain and was sent to the emergency department. In the emergency department she was given aspirin and found to have HStroponin of 26 with repeat of 52. Her CXR showed no acute cardiopulmonary disease with mitral annulus calcification as well as aortic atherosclerosis. EKG showed new complete RBBB with more pronounced DWI in leads V1, V3, and III. She is also noted to have dependent lower extremity edema. She was started on IV heparin at that time and admitted. She was taken to the Cath Lab on 07/03/2019 and was found to have a proximal-mid LAD lesion 70%, proximal LAD lesion 40%, mid RCA to distal RCA lesion 80%, DES successfully placed, distal RCA 95% DES was successfully placed. Femoral approach was used. Total DES x4 to RCA.  She presentedthe clinic 4/26/2021for follow-up evaluationwith her sisterand statedshe wasfeeling better than she did prior to her cardiac catheterization. She hadbeen walking for 5 minutes every other day. Shewasready to do more around her house and starteddriving. She was interested in further prevention/risk reduction. We talked about the importance of medication compliance, diet, exercise, and keeping follow-up appointments. She statedthat her blood pressure wasslightly elevated at home running between 140s and 130s. I will increasedher metoprolol to 50 mg twice daily, repeateda BMP due to her AKI, and hadher follow-up in 1 month for reevaluation.  She presented to the clinic 08/19/2019 for follow-up evaluation and statedshe has had loose stools for 1 to 2 weeks. They recently subsided over the last 1 to 2  days. She stated she was taking magnesium and had stopped the medication. Her stools had returned to normal. She stated that she did not feel back to normal yet but continued to hydrate and eat small bites of food. She had an appointment with her PCP on June 22. I will gave her instructions for the brat diet, encourage her to continue p.o. hydration, and planned her return in 1 month for reevaluation.  She was admitted to the hospital 08/22/2018 -6 /8/21 with diarrhea bilateral leg tenderness, and abnormal outpatient blood work.  Her AST was elevated at 284, ALT 525, normal bilirubin and creatinine 1.88 which had continued to improve.  She received 500 cc of saline emergency department.  She was diagnosed with rhabdomyolysis.  Her rosuvastatin was held at that time.  Creatinine improved with IV hydration.  Recommendations were given to continue to hold statin until told to resume future.  She presented to the clinic 09/24/2019 for follow-up evaluation and stated  she was feeling somewhat better.  She presented to the emergency department and her Crestor was stopped.  She continued to have bilateral lower extremity soreness.  She had not increased her physical activity but did do 10 minutes of walking most days of the week.  Due to her lower extremity soreness I  continued to hold her statin.  I planned her follow-up in 1 month for reevaluation and repeat lab work.  I also refered her to Dr. Hilty/lipid clinic for further evaluation.    Presented for evaluation of lipids with Dr. Debara Pickett on 10/08/2019.  Due to her rhabdo myelitis he recommended against retrial of statin therapy.  He suggested switching to PCSK9 inhibitor reevaluating liver enzymes around 11/08/2019 and follow-up lipid panel around 01/08/2020.  She presents to the clinic today for follow-up evaluation and states she is feeling much better today.  She notes that her myalgias are nearly gone.  She is tolerating her Praluent medication well.   Follow-up lab work is already been ordered.  She indicates that she has had 1 episode of chest discomfort while she has been at rest.  She indicated that with her chest pain she had elevated blood pressure around 465 range systolic.  Her chest discomfort was relieved with rest.  We have discussed taking 25 mg of metoprolol as needed for blood pressure greater than 160.  Blood pressure well controlled today.  I will have her monitor her blood pressure and follow-up with Dr. Percival Spanish in 3 months.  Today shedenies chest pain, shortness of breath, lower extremity edema, fatigue, palpitations, melena, hematuria, hemoptysis, diaphoresis, weakness, presyncope, syncope, orthopnea, and PND.   Home Medications    Prior to Admission medications   Medication Sig Start Date End Date Taking? Authorizing Provider  acetaminophen (TYLENOL) 500 MG tablet Take 1,000 mg by mouth every 6 (six) hours as needed for moderate pain or headache.    [provider]  Alirocumab (PRALUENT) 150 MG/ML SOAJ Inject 1 Dose into the skin every 14 (fourteen) days. 10/14/19   Hilty, Nadean Corwin, MD  amLODipine (NORVASC) 10 MG tablet Take 10 mg by mouth daily.    [provider]  aspirin 81 MG tablet Take 81 mg by mouth daily.      [provider]  Bacillus Coagulans-Inulin (PROBIOTIC) 1-250 BILLION-MG CAPS Take 1 capsule by mouth daily. 08/15/19   Wieters, Hallie C, PA-C  cholecalciferol (VITAMIN D3) 25 MCG (1000 UNIT) tablet Take 2,000 Units by mouth daily.    [provider]  Cobalamin Combinations (B-12) 7817515533 MCG SUBL Place 1 tablet under the tongue daily.     [provider]  Empagliflozin-linaGLIPtin (GLYXAMBI) 10-5 MG TABS Take by mouth.    [provider]  hydrocortisone cream 1 % Apply 1 application topically 2 (two) times daily as needed for itching.     [provider]  levothyroxine (SYNTHROID, LEVOTHROID) 88 MCG tablet Take 88 mcg by mouth daily. 07/02/17    [provider]  metoprolol tartrate (LOPRESSOR) 50 MG tablet Take 0.5 tablets (25 mg total) by mouth 2 (two) times daily. 08/19/19   Deberah Pelton, NP  Omega-3 Fatty Acids (FISH OIL) 1200 MG CAPS Take 1,200 mg by mouth 2 (two) times daily.     [provider]  omeprazole (PRILOSEC) 20 MG capsule Take 20 mg by mouth 2 (two) times daily. Takes prn    [provider]  ticagrelor (BRILINTA) 90 MG TABS tablet Take 1 tablet (90 mg total) by mouth 2 (two) times daily. 08/04/19   Deberah Pelton, NP    Family History    Family History  Problem Relation Age of Onset  . Diabetes Brother        x3  . Hypertension Brother  x3  . Diabetes Brother   . Stroke Brother   . Heart attack Mother 85       Mother Died of MI age 50  . Diabetes Sister   . Stroke Sister    She indicated that her mother is deceased. She indicated that her father is deceased. She indicated that two of her four brothers are alive.  Social History    Social History   Socioeconomic History  . Marital status: Widowed    Spouse name: Not on file  . Number of children: Not on file  . Years of education: Not on file  . Highest education level: Not on file  Occupational History  . Occupation: retired    Fish farm manager: RETIRED    Comment: accountant  Tobacco Use  . Smoking status: Never Smoker  . Smokeless tobacco: Never Used  Vaping Use  . Vaping Use: Never used  Substance and Sexual Activity  . Alcohol use: Yes    Comment: occ  . Drug use: No  . Sexual activity: Yes    Partners: Male    Birth control/protection: None  Other Topics Concern  . Not on file  Social History Narrative   Lives alone.  Four children.    Social Determinants of Health   Financial Resource Strain:   . Difficulty of Paying Living Expenses:   Food Insecurity: No Food Insecurity  . Worried About Charity fundraiser in the Last Year: Never true  . Ran Out of Food in the Last Year: Never true  Transportation  Needs: No Transportation Needs  . Lack of Transportation (Medical): No  . Lack of Transportation (Non-Medical): No  Physical Activity:   . Days of Exercise per Week:   . Minutes of Exercise per Session:   Stress:   . Feeling of Stress :   Social Connections:   . Frequency of Communication with Friends and Family:   . Frequency of Social Gatherings with Friends and Family:   . Attends Religious Services:   . Active Member of Clubs or Organizations:   . Attends Archivist Meetings:   Marland Kitchen Marital Status:   Intimate Partner Violence: Not At Risk  . Fear of Current or Ex-Partner: No  . Emotionally Abused: No  . Physically Abused: No  . Sexually Abused: No     Review of Systems    General:  No chills, fever, night sweats or weight changes.  Cardiovascular:  No chest pain, dyspnea on exertion, edema, orthopnea, palpitations, paroxysmal nocturnal dyspnea. Dermatological: No rash, lesions/masses Respiratory: No cough, dyspnea Urologic: No hematuria, dysuria Abdominal:   No nausea, vomiting, diarrhea, bright red blood per rectum, melena, or hematemesis Neurologic:  No visual changes, wkns, changes in mental status. All other systems reviewed and are otherwise negative except as noted above.  Physical Exam    VS:  BP 116/63   Pulse 71   Ht 5\' 2"  (1.575 m)   Wt 144 lb 6.4 oz (65.5 kg)   SpO2 100%   BMI 26.41 kg/m  , BMI Body mass index is 26.41 kg/m. GEN: Well nourished, well developed, in no acute distress. HEENT: normal. Neck: Supple, no JVD, carotid bruits, or masses. Cardiac: RRR, no murmurs, rubs, or gallops. No clubbing, cyanosis, edema.  Radials/DP/PT 2+ and equal bilaterally.  Respiratory:  Respirations regular and unlabored, clear to auscultation bilaterally. GI: Soft, nontender, nondistended, BS + x 4. MS: no deformity or atrophy. Skin: warm and dry, no rash. Neuro:  Strength and  sensation are intact. Psych: Normal affect.  Accessory Clinical Findings      Recent Labs: 07/11/2019: Magnesium 2.3 08/26/2019: ALT 181; BUN 18; Creatinine, Ser 1.32; Hemoglobin 8.9; Platelets 301; Potassium 4.1; Sodium 140   Recent Lipid Panel    Component Value Date/Time   CHOL 121 07/03/2019 0051   TRIG 113 07/03/2019 0051   HDL 37 (L) 07/03/2019 0051   CHOLHDL 3.3 07/03/2019 0051   VLDL 23 07/03/2019 0051   LDLCALC 61 07/03/2019 0051    ECG personally reviewed by me today-none today.  EKG 08/19/2019 sinus rhythm with PACs incomplete right bundle branch block prolonged QT 87 bpm  EKG 07/14/2019 normal sinus rhythm possible left atrial enlargement no ST or T wave deviation 79 bpm  Echocardiogram 06/23/2019 1. Left ventricular ejection fraction, by estimation, is 65 to 70%. The  left ventricle has normal function. The left ventricle has no regional  wall motion abnormalities. Left ventricular diastolic parameters are  consistent with Grade I diastolic  dysfunction (impaired relaxation). Elevated left ventricular end-diastolic  pressure.  2. Right ventricular systolic function is normal. The right ventricular  size is normal.  3. The mitral valve is normal in structure. Moderate mitral valve  regurgitation. No evidence of mitral stenosis.  4. Tricuspid valve regurgitation is mild to moderate.  5. The aortic valve is tricuspid. Aortic valve regurgitation is moderate.  Mild aortic valve stenosis. Aortic valve mean gradient measures 7.0 mmHg.  6. The inferior vena cava is normal in size with greater than 50%  respiratory variability, suggesting right atrial pressure of 3 mmHg.  Cardiac catheterization 07/03/2019  Cardiac Catheterization 07/03/19  Prox LAD to Mid LAD lesion is 70% stenosed.  Prox LAD lesion is 40% stenosed.  Mid RCA to Dist RCA lesion is 80% stenosed.  A stent was successfully placed.  Dist RCA lesion is 95% stenosed.  Post intervention, there is a 0% residual stenosis.  Post intervention, there is a 0% residual  stenosis.  There is severe mitral annular calcification.   The LAD has moderate diffuse calcification with 40 followed by 70% proximal stenosis prior to the takeoff of the first diagonal vessel with 20% mid stenosis.  Left circumflex vessel is tortuous and free of significant obstructive disease.  The RCA is a dominant vessel that had 80% diffuse stenosis in the region of the acute margin followed by 95% focal distal stenosis.  Transient 2-1/complete heart block with insertion of catheter into the LV with spontaneous resolution.  PCI performed via the femoral approach after transitioning from the radial approach which was associated with significant radial artery spasm.  Very difficult PCI with very difficult catheter backup with initial PTCA of the distal and acute margin stenoses with subsequent development of guiding catheter induced very proximal RCA dissection extending to the acute margin. Utilizing telescope guidliner support from distal to near ostial for Resolute Onyx DES stents were inserted with the most distal stent being a 2.5 x 18 mm stent followed by 2.5 x 22 mm stent, 2.5 x 38 mm stent and the most proximal stent a 2.75 x 18 mm Resolute Onyx stent with an excellent angiographic result with brisk TIMI-3 flow and no evidence for residual dissection.  RECOMMENDATION: Patient will be hydrated post procedure. Continue DAPT for minimum of 12 months. Initiate medical therapy for ischemic disease. May need future CSI atherectomy to the LAD lesion which is significantly calcified. Aggressive lipid-lowering therapy.  Coronary Diagrams  Diagnostic Dominance: Right  Intervention  Assessment & Plan   1.  Rhabdomyolysis-muscle soreness nearly resolved. Diarrhea has resolved.  Caused by  increased rosuvastatin dosing post NSTEMI.  Rosuvastatin held with hospital admission 08/22/2018 1-6 /8/21 AST was elevated at 284, ALT 525, normal bilirubin and creatinine  1.88 Heart healthy low-sodium diet-salty 6 given Increase physical activity as tolerated Followed by PCP, bring BMP results in one month Now taking Praluent/PCSK9 inhibitor Repeat LFTs 11/08/2019  Essential hypertension-BP [id number].Had previously been [id number] systolic at home. Continue amlodipine Continuemetoprolol to 25 mg twice daily-May take an extra 25 mg metoprolol as needed for a systolic blood pressure greater than 160 Heart healthy low-sodium diet-salty 6 given Increase physical activity as tolerated  NSTEMI-heart rate today 71 bpm.Received DES x4 to his RCA. LAD with 40% and 70% calcified stenosis. DAPT aspirin and Brilinta for 12 months prescribed. Echocardiogram showed an EF of 65-70%, no WMA, G1 DD, moderate MR, mild to moderate TR, moderate AR. Continue aspirin Continue Brilinta-assistance sheet given Continue amlodipine Continuemetoprolol50 mg twice daily Continue Praluent Continue cardiac rehab Order sublingual nitroglycerin Heart healthy low-sodium diet-salty 6 given Increase physical activity as tolerated  Hyperlipidemia-07/03/2019: Cholesterol 121; HDL 37; LDL Cholesterol 61; Triglycerides 113; VLDL 23 Continue to hold statin due to bilateral lower extremity muscle soreness. Heart healthy low-sodium high-fiber diet Increase physical activity as tolerated Refered to Dr. Hilty/lipid clinic-now on Praluent/PCSK9 inhibitor  Acute kidney injury-creatinine at discharge 1.36 07/05/19. On follow-up creatinine 1.53 07/11/19 Continue p.o. hydration Monitored by PCP  Disposition: Follow-up with  Dr. Percival Spanish in 3 months.   Jossie Ng. Cristina Mattern NP-C    10/28/2019, 10:39 AM [address] Office [phone] Fax [phone]  Notice: This dictation was prepared with Dragon dictation along with smaller phrase technology. Any transcriptional errors that result from this process are unintentional and may not  be corrected upon review.

## 2019-10-28 ENCOUNTER — Encounter (HOSPITAL_COMMUNITY)
Admission: RE | Admit: 2019-10-28 | Discharge: 2019-10-28 | Disposition: A | Discharge status: Home / Self Care | Payer: Medicare Other | Source: Ambulatory Visit | Attending: Cardiology | Admitting: Cardiology

## 2019-10-28 ENCOUNTER — Other Ambulatory Visit: Payer: Self-pay

## 2019-10-28 ENCOUNTER — Encounter: Payer: Self-pay | Admitting: General Practice

## 2019-10-28 ENCOUNTER — Ambulatory Visit (INDEPENDENT_AMBULATORY_CARE_PROVIDER_SITE_OTHER): Payer: Medicare Other | Admitting: General Practice

## 2019-10-28 ENCOUNTER — Encounter (HOSPITAL_COMMUNITY): Payer: Self-pay

## 2019-10-28 VITALS — BP 116/63 | HR 71 | Ht 62.0 in | Wt 144.4 lb

## 2019-10-28 VITALS — BP 110/60 | HR 67 | Ht 61.0 in | Wt 144.8 lb

## 2019-10-28 DIAGNOSIS — Z955 Presence of coronary angioplasty implant and graft: Secondary | ICD-10-CM | POA: Insufficient documentation

## 2019-10-28 DIAGNOSIS — N179 Acute kidney failure, unspecified: Secondary | ICD-10-CM

## 2019-10-28 DIAGNOSIS — G720 Drug-induced myopathy: Secondary | ICD-10-CM

## 2019-10-28 DIAGNOSIS — I10 Essential (primary) hypertension: Secondary | ICD-10-CM | POA: Diagnosis not present

## 2019-10-28 DIAGNOSIS — I214 Non-ST elevation (NSTEMI) myocardial infarction: Secondary | ICD-10-CM

## 2019-10-28 DIAGNOSIS — T466X5A Adverse effect of antihyperlipidemic and antiarteriosclerotic drugs, initial encounter: Secondary | ICD-10-CM | POA: Diagnosis not present

## 2019-10-28 DIAGNOSIS — E7800 Pure hypercholesterolemia, unspecified: Secondary | ICD-10-CM

## 2019-10-28 MED ORDER — METOPROLOL TARTRATE 50 MG PO TABS: 25.0000 mg | ORAL_TABLET | Freq: Two times a day (BID) | ORAL | 3 refills | Status: DC

## 2019-10-28 MED ORDER — NITROGLYCERIN 0.4 MG SL SUBL: 0.4000 mg | SUBLINGUAL_TABLET | SUBLINGUAL | 3 refills | Status: AC | PRN

## 2019-10-28 NOTE — Patient Instructions (Signed)
Medication Instructions:  MAY TAKE EXTRA METOPROLOL 25MG (1/2TAB) MAY TAKE 2 ADDITIONAL TIMES DAILY *If you need a refill on your cardiac medications before your next appointment, please call your pharmacy*  Special Instructions TAKE AND LOG YOUR BLOOD PRESSURE 2 TIMES A WEEK, WHEN YOUR BP IS >160   PLEASE READ AND FOLLOW SALTY 6-ATTACHED  Follow-Up: Your next appointment:  3 month(s)  In Person with Minus Breeding, [place], you and your health needs are our priority.  As part of our continuing mission to provide you with exceptional heart care, we have created designated Provider Care Teams.  These Care Teams include your primary Cardiologist (physician) and Advanced Practice Providers (APPs -  Physician Assistants and Nurse Practitioners) who all work together to provide you with the care you need, when you need it.

## 2019-10-28 NOTE — Progress Notes (Signed)
Cardiac Individual Treatment Plan  Patient Details  Name: Karen West MRN: [id number] Date of Birth: [birth year] Referring Provider:     CARDIAC REHAB PHASE II ORIENTATION from 10/28/2019 in [place]  Referring Provider Minus Breeding, MD      Initial Encounter Date:    CARDIAC REHAB PHASE II ORIENTATION from 10/28/2019 in [place]  Date 10/28/19      Visit Diagnosis: NSTEMI (non-ST elevated myocardial infarction) (HCC),07/02/19  S/P DES x 4 07/03/19  Patient's Home Medications on Admission:  Current Outpatient Medications:  .  acetaminophen (TYLENOL) 500 MG tablet, Take 1,000 mg by mouth every 6 (six) hours as needed for moderate pain or headache., Disp: , Rfl:  .  Alirocumab (PRALUENT) 150 MG/ML SOAJ, Inject 1 Dose into the skin every 14 (fourteen) days., Disp: 2 pen, Rfl: 11 .  amLODipine (NORVASC) 10 MG tablet, Take 10 mg by mouth daily., Disp: , Rfl:  .  aspirin 81 MG tablet, Take 81 mg by mouth daily.  , Disp: , Rfl:  .  cholecalciferol (VITAMIN D3) 25 MCG (1000 UNIT) tablet, Take 2,000 Units by mouth daily., Disp: , Rfl:  .  Cobalamin Combinations (B-12) [phone] MCG SUBL, Place 1 tablet under the tongue daily. , Disp: , Rfl:  .  Empagliflozin-linaGLIPtin (GLYXAMBI) 10-5 MG TABS, Take by mouth., Disp: , Rfl:  .  hydrocortisone cream 1 %, Apply 1 application topically 2 (two) times daily as needed for itching. , Disp: , Rfl:  .  levothyroxine (SYNTHROID, LEVOTHROID) 88 MCG tablet, Take 88 mcg by mouth daily., Disp: , Rfl: 5 .  metoprolol tartrate (LOPRESSOR) 50 MG tablet, Take 0.5 tablets (25 mg total) by mouth 2 (two) times daily. MAY TAKE EXTRA 25MG  MAY TAKE 2 ADDITIONAL TIMES DAILY, Disp: 60 tablet, Rfl: 3 .  Omega-3 Fatty Acids (FISH OIL) 1200 MG CAPS, Take 1,200 mg by mouth 2 (two) times daily. , Disp: , Rfl:  .  omeprazole (PRILOSEC) 20 MG capsule, Take 20 mg by mouth 2 (two) times daily. Takes prn,  Disp: , Rfl:  .  ticagrelor (BRILINTA) 90 MG TABS tablet, Take 1 tablet (90 mg total) by mouth 2 (two) times daily., Disp: 180 tablet, Rfl: 3 .  nitroGLYCERIN (NITROSTAT) 0.4 MG SL tablet, Place 1 tablet (0.4 mg total) under the tongue every 5 (five) minutes as needed for chest pain., Disp: 25 tablet, Rfl: 3  Past Medical History: Past Medical History:  Diagnosis Date  . Atherosclerosis of aorta ([place])    a. 01/2017/03/2017 - noted on high res chest CTs.  . Breast cancer ([place])    a. Bilateral --> s/p left mastectomy  . Carotid artery disease ([place])    a. 07/6312 w/ 97-02% LICA stenosis and <63% RICA stenosis; b. 10/2015 Carotid U/S: < 50% BICA stenosis  . Chest pain    a. 09/2011 MV: EF 68%, no ischemia/infarct.  . Chronic anemia   . Chronic headaches    denies  . Coronary artery calcification seen on CT scan    a. 01/2017 High res CT: atherosclerotic calcification of the arterial vascularture, including severe involvement of the coronary arteries; b. 03/2017 CT Chest: coronary and Ao atheroscelrosis.  Marland Kitchen GERD (gastroesophageal reflux disease)   . History of echocardiogram    a. 09/2011 Echo: EF 55-60%, no rwma, triv AI, PASP 67mmHg.  Marland Kitchen Hyperlipidemia   . Hypertension   . Hyperthyroidism   . Left upper lobe pulmonary nodule  a. 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 05/2017 s/p VATS.  . Multinodular goiter    a. 02/2017 PET scan- Hypermetabolic nodule;  b. 03/9620 s/p thyroidectomy  . Obesity   . OSA on CPAP    cpap  . Osteoarthritis   . Personal history of radiation therapy 1999  . Right bundle branch block   . Type II or unspecified type diabetes mellitus without mention of complication, uncontrolled     Tobacco Use: Social History   Tobacco Use  Smoking Status Never Smoker  Smokeless Tobacco Never Used    Labs: Recent Review Flowsheet Data    Labs for ITP Cardiac and Pulmonary Rehab  Latest Ref Rng & Units 05/08/2017 05/31/2017 06/05/2017 07/02/2019 07/03/2019   Cholestrol 0 - 200 mg/dL - - - - 121   LDLCALC 0 - 99 mg/dL - - - - 61   HDL >40 mg/dL - - - - 37(L)   Trlycerides <150 mg/dL - - - - 113   Hemoglobin A1c 4.8 - 5.6 % 6.9(H) - - 7.3(H) -   PHART 7.35 - 7.45 - 7.465(H) 7.460(H) - -   PCO2ART 32 - 48 mmHg - 36.8 37.2 - -   HCO3 20.0 - 28.0 mmol/L - 26.1 26.2 - -   O2SAT % - 98.5 95.1 - -      Capillary Blood Glucose: Lab Results  Component Value Date   GLUCAP 248 (H) 08/26/2019   GLUCAP 157 (H) 08/26/2019   GLUCAP 195 (H) 08/25/2019   GLUCAP 203 (H) 08/25/2019   GLUCAP 208 (H) 08/25/2019     Exercise Target Goals: Exercise Program Goal: Individual exercise prescription set using results from initial 6 min walk test and THRR while considering  patient's activity barriers and safety.   Exercise Prescription Goal: Starting with aerobic activity 30 plus minutes a day, 3 days per week for initial exercise prescription. Provide home exercise prescription and guidelines that participant acknowledges understanding prior to discharge.  Activity Barriers & Risk Stratification:  Activity Barriers & Cardiac Risk Stratification - 10/28/19 0916      Activity Barriers & Cardiac Risk Stratification   Activity Barriers Arthritis;Deconditioning   Pt has bilateral knee pain at times   Cardiac Risk Stratification High           6 Minute Walk:  6 Minute Walk    Row Name 10/28/19 0958         6 Minute Walk   Phase Initial     Distance 957 feet     Walk Time 6 minutes     # of Rest Breaks 0     MPH 1.81     METS 2.02     RPE 11     Perceived Dyspnea  0     VO2 Peak 7.07     Symptoms Yes (comment)     Comments Fatigue     Resting HR 67 bpm     Resting BP 110/60     Resting Oxygen Saturation  100 %     Exercise Oxygen Saturation  during 6 min walk 99 %     Max Ex. HR 114 bpm     Max Ex. BP 140/66     2 Minute Post BP 122/64            Oxygen  Initial Assessment:   Oxygen Re-Evaluation:   Oxygen Discharge (Final Oxygen Re-Evaluation):   Initial Exercise Prescription:  Initial Exercise Prescription - 10/28/19 0900      Date of Initial Exercise RX and Referring Provider   Date 10/28/19    Referring Provider Minus Breeding, MD    Expected Discharge Date 12/26/19      NuStep   Level 1    SPM 75    Minutes 30    METs 1.7      Prescription Details   Frequency (times per week) 3    Duration Progress to 30 minutes of continuous aerobic without signs/symptoms of physical distress      Intensity   THRR 40-80% of Max Heartrate 57-114    Ratings of Perceived Exertion 11-13    Perceived Dyspnea 0-4      Progression   Progression Continue progressive overload as per policy without signs/symptoms or physical distress.      Resistance Training   Training Prescription Yes    Weight 2lbs    Reps 10-15           Perform Capillary Blood Glucose checks as needed.  Exercise Prescription Changes:   Exercise Comments:   Exercise Goals and Review:   Exercise Goals    Row Name 10/28/19 9030             Exercise Goals   Increase Physical Activity Yes       Intervention Provide advice, education, support and counseling about physical activity/exercise needs.;Develop an individualized exercise prescription for aerobic and resistive training based on initial evaluation findings, risk stratification, comorbidities and participant's personal goals.       Expected Outcomes Short Term: Attend rehab on a regular basis to increase amount of physical activity.;Long Term: Add in home exercise to make exercise part of routine and to increase amount of physical activity.;Long Term: Exercising regularly at least 3-5 days a week.       Increase Strength and Stamina Yes       Intervention Provide advice, education, support and counseling about physical activity/exercise needs.;Develop an individualized exercise prescription for  aerobic and resistive training based on initial evaluation findings, risk stratification, comorbidities and participant's personal goals.       Expected Outcomes Short Term: Increase workloads from initial exercise prescription for resistance, speed, and METs.;Short Term: Perform resistance training exercises routinely during rehab and add in resistance training at home;Long Term: Improve cardiorespiratory fitness, muscular endurance and strength as measured by increased METs and functional capacity (6MWT)       Able to understand and use rate of perceived exertion (RPE) scale Yes       Intervention Provide education and explanation on how to use RPE scale       Expected Outcomes Long Term:  Able to use RPE to guide intensity level when exercising independently;Short Term: Able to use RPE daily in rehab to express subjective intensity level       Knowledge and understanding of Target Heart Rate Range (THRR) Yes       Intervention Provide education and explanation of THRR including how the numbers were predicted and where they are located for reference       Expected Outcomes Short Term: Able to state/look up THRR;Long Term: Able to use THRR to govern intensity when exercising independently;Short Term: Able to use daily as guideline for intensity in rehab       Understanding of Exercise Prescription Yes       Intervention Provide education, explanation, and written materials on patient's individual exercise prescription       Expected  Outcomes Short Term: Able to explain program exercise prescription;Long Term: Able to explain home exercise prescription to exercise independently              Exercise Goals Re-Evaluation :    Discharge Exercise Prescription (Final Exercise Prescription Changes):   Nutrition:  Target Goals: Understanding of nutrition guidelines, daily intake of sodium 1500mg , cholesterol 200mg , calories 30% from fat and 7% or less from saturated fats, daily to have 5 or more  servings of fruits and vegetables.  Biometrics:  Pre Biometrics - 10/28/19 0800      Pre Biometrics   Waist Circumference 38 inches    Hip Circumference 43.5 inches    Waist to Hip Ratio 0.87 %    Triceps Skinfold 25 mm    % Body Fat 40.5 %    Grip Strength 21.5 kg    Flexibility 18 in    Single Leg Stand 10.2 seconds            Nutrition Therapy Plan and Nutrition Goals:   Nutrition Assessments:   Nutrition Goals Re-Evaluation:   Nutrition Goals Discharge (Final Nutrition Goals Re-Evaluation):   Psychosocial: Target Goals: Acknowledge presence or absence of significant depression and/or stress, maximize coping skills, provide positive support system. Participant is able to verbalize types and ability to use techniques and skills needed for reducing stress and depression.  Initial Review & Psychosocial Screening:  Initial Psych Review & Screening - 10/28/19 1235      Initial Review   Current issues with None Identified      Family Dynamics   Good Support System? Yes   Caeleigh lives alone. Aubriauna has her children for support who lives in [place]     Barriers   Psychosocial barriers to participate in program There are no identifiable barriers or psychosocial needs.      Screening Interventions   Interventions Encouraged to exercise           Quality of Life Scores:  Quality of Life - 10/28/19 0932      Quality of Life   Select Quality of Life      Quality of Life Scores   Health/Function Pre 25.5 %    Socioeconomic Pre 29.14 %    Psych/Spiritual Pre 29.14 %    Family Pre 27 %    GLOBAL Pre 27.23 %          Scores of 19 and below usually indicate a poorer quality of life in these areas.  A difference of  2-3 points is a clinically meaningful difference.  A difference of 2-3 points in the total score of the Quality of Life Index has been associated with significant improvement in overall quality of life, self-image, physical symptoms, and general  health in studies assessing change in quality of life.  PHQ-9: Recent Review Flowsheet Data    Depression screen [facility] 2/9 10/28/2019 09/12/2019 01/15/2018 10/11/2017 09/26/2017   Decreased Interest 0 0 0 0 0   Down, Depressed, Hopeless 0 0 0 0 0   PHQ - 2 Score 0 0 0 0 0   Altered sleeping - - 1  0 0   Tired, decreased energy - - 1 1 1    Change in appetite - - 2 2 3    Feeling bad or failure about yourself  - - 0 0 0   Trouble concentrating - - 0 0 0   Moving slowly or fidgety/restless - - 0 1 0   Suicidal thoughts - -  0 0 0   PHQ-9 Score - - 4 4 4    Difficult doing work/chores - - Not difficult at all Not difficult at all Not difficult at all     Interpretation of Total Score  Total Score Depression Severity:  1-4 = Minimal depression, 5-9 = Mild depression, 10-14 = Moderate depression, 15-19 = Moderately severe depression, 20-27 = Severe depression   Psychosocial Evaluation and Intervention:   Psychosocial Re-Evaluation:   Psychosocial Discharge (Final Psychosocial Re-Evaluation):   Vocational Rehabilitation: Provide vocational rehab assistance to qualifying candidates.   Vocational Rehab Evaluation & Intervention:  Vocational Rehab - 10/28/19 1237      Initial Vocational Rehab Evaluation & Intervention   Assessment shows need for Vocational Rehabilitation No   Lasha is retired and does not need vocational rehab at this time          Education: Education Goals: Education classes will be provided on a weekly basis, covering required topics. Participant will state understanding/return demonstration of topics presented.  Learning Barriers/Preferences:  Learning Barriers/Preferences - 10/28/19 0933      Learning Barriers/Preferences   Learning Barriers Sight   Wears glasses   Learning Preferences Group Instruction;Individual Instruction;Skilled Demonstration           Education Topics: Hypertension, Hypertension Reduction -Define heart disease and high blood  pressure. Discus how high blood pressure affects the body and ways to reduce high blood pressure.   Exercise and Your Heart -Discuss why it is important to exercise, the FITT principles of exercise, normal and abnormal responses to exercise, and how to exercise safely.   Angina -Discuss definition of angina, causes of angina, treatment of angina, and how to decrease risk of having angina.   Cardiac Medications -Review what the following cardiac medications are used for, how they affect the body, and side effects that may occur when taking the medications.  Medications include Aspirin, Beta blockers, calcium channel blockers, ACE Inhibitors, angiotensin receptor blockers, diuretics, digoxin, and antihyperlipidemics.   Congestive Heart Failure -Discuss the definition of CHF, how to live with CHF, the signs and symptoms of CHF, and how keep track of weight and sodium intake.   Heart Disease and Intimacy -Discus the effect sexual activity has on the heart, how changes occur during intimacy as we age, and safety during sexual activity.   Smoking Cessation / COPD -Discuss different methods to quit smoking, the health benefits of quitting smoking, and the definition of COPD.   Nutrition I: Fats -Discuss the types of cholesterol, what cholesterol does to the heart, and how cholesterol levels can be controlled.   Nutrition II: Labels -Discuss the different components of food labels and how to read food label   Heart Parts/Heart Disease and PAD -Discuss the anatomy of the heart, the pathway of blood circulation through the heart, and these are affected by heart disease.   Stress I: Signs and Symptoms -Discuss the causes of stress, how stress may lead to anxiety and depression, and ways to limit stress.   Stress II: Relaxation -Discuss different types of relaxation techniques to limit stress.   Warning Signs of Stroke / TIA -Discuss definition of a stroke, what the signs and  symptoms are of a stroke, and how to identify when someone is having stroke.   Knowledge Questionnaire Score:  Knowledge Questionnaire Score - 10/28/19 0935      Knowledge Questionnaire Score   Pre Score 20/24           Core Components/Risk Factors/Patient Goals  at Admission:  Personal Goals and Risk Factors at Admission - 10/28/19 1237      Core Components/Risk Factors/Patient Goals on Admission    Weight Management Weight Maintenance    Intervention Weight Management: Develop a combined nutrition and exercise program designed to reach desired caloric intake, while maintaining appropriate intake of nutrient and fiber, sodium and fats, and appropriate energy expenditure required for the weight goal.;Weight Management: Provide education and appropriate resources to help participant work on and attain dietary goals.;Weight Management/Obesity: Establish reasonable short term and long term weight goals.    Admit Weight 144 lb 13.5 oz (65.7 kg)    Expected Outcomes Short Term: Continue to assess and modify interventions until short term weight is achieved;Long Term: Adherence to nutrition and physical activity/exercise program aimed toward attainment of established weight goal;Weight Maintenance: Understanding of the daily nutrition guidelines, which includes 25-35% calories from fat, 7% or less cal from saturated fats, less than 200mg  cholesterol, less than 1.5gm of sodium, & 5 or more servings of fruits and vegetables daily;Weight Loss: Understanding of general recommendations for a balanced deficit meal plan, which promotes 1-2 lb weight loss per week and includes a negative energy balance of [phone] kcal/d;Understanding recommendations for meals to include 15-35% energy as protein, 25-35% energy from fat, 35-60% energy from carbohydrates, less than 200mg  of dietary cholesterol, 20-35 gm of total fiber daily;Understanding of distribution of calorie intake throughout the day with the consumption of  4-5 meals/snacks    Diabetes Yes    Intervention Provide education about signs/symptoms and action to take for hypo/hyperglycemia.;Provide education about proper nutrition, including hydration, and aerobic/resistive exercise prescription along with prescribed medications to achieve blood glucose in normal ranges: Fasting glucose 65-99 mg/dL    Hypertension Yes    Intervention Provide education on lifestyle modifcations including regular physical activity/exercise, weight management, moderate sodium restriction and increased consumption of fresh fruit, vegetables, and low fat dairy, alcohol moderation, and smoking cessation.;Monitor prescription use compliance.    Lipids Yes    Intervention Provide education and support for participant on nutrition & aerobic/resistive exercise along with prescribed medications to achieve LDL 70mg , HDL >40mg .    Expected Outcomes Short Term: Participant states understanding of desired cholesterol values and is compliant with medications prescribed. Participant is following exercise prescription and nutrition guidelines.;Long Term: Cholesterol controlled with medications as prescribed, with individualized exercise RX and with personalized nutrition plan. Value goals: LDL < 70mg , HDL > 40 mg.           Core Components/Risk Factors/Patient Goals Review:    Core Components/Risk Factors/Patient Goals at Discharge (Final Review):    ITP Comments:  ITP Comments    Row Name 10/28/19 1227           ITP Comments Dr Fransico Him MD, Medical Director              Comments: Patient attended orientation on 10/28/2019 to review rules and guidelines for program.  Completed 6 minute walk test, Intitial ITP, and exercise prescription.  VSS. Telemetry-Sinus Rhythm , Bundle Branch Block this has been previously documented. Ardine did report having some fatigue towards the end of her walk test as she is deconditioned other wise  Asymptomatic. Safety measures and social  distancing in place per CDC guidelines.Barnet Pall, RN,BSN 10/28/2019 12:46 PM

## 2019-10-31 DIAGNOSIS — Z8739 Personal history of other diseases of the musculoskeletal system and connective tissue: Secondary | ICD-10-CM | POA: Diagnosis not present

## 2019-10-31 DIAGNOSIS — E039 Hypothyroidism, unspecified: Secondary | ICD-10-CM | POA: Diagnosis not present

## 2019-10-31 DIAGNOSIS — C3490 Malignant neoplasm of unspecified part of unspecified bronchus or lung: Secondary | ICD-10-CM | POA: Diagnosis not present

## 2019-10-31 DIAGNOSIS — E119 Type 2 diabetes mellitus without complications: Secondary | ICD-10-CM | POA: Diagnosis not present

## 2019-10-31 DIAGNOSIS — N390 Urinary tract infection, site not specified: Secondary | ICD-10-CM | POA: Diagnosis not present

## 2019-10-31 DIAGNOSIS — R809 Proteinuria, unspecified: Secondary | ICD-10-CM | POA: Diagnosis not present

## 2019-10-31 DIAGNOSIS — I129 Hypertensive chronic kidney disease with stage 1 through stage 4 chronic kidney disease, or unspecified chronic kidney disease: Secondary | ICD-10-CM | POA: Diagnosis not present

## 2019-10-31 DIAGNOSIS — I10 Essential (primary) hypertension: Secondary | ICD-10-CM | POA: Diagnosis not present

## 2019-10-31 DIAGNOSIS — E785 Hyperlipidemia, unspecified: Secondary | ICD-10-CM | POA: Diagnosis not present

## 2019-10-31 DIAGNOSIS — C50919 Malignant neoplasm of unspecified site of unspecified female breast: Secondary | ICD-10-CM | POA: Diagnosis not present

## 2019-10-31 DIAGNOSIS — E1122 Type 2 diabetes mellitus with diabetic chronic kidney disease: Secondary | ICD-10-CM | POA: Diagnosis not present

## 2019-10-31 DIAGNOSIS — N183 Chronic kidney disease, stage 3 unspecified: Secondary | ICD-10-CM | POA: Diagnosis not present

## 2019-10-31 DIAGNOSIS — N179 Acute kidney failure, unspecified: Secondary | ICD-10-CM | POA: Diagnosis not present

## 2019-10-31 DIAGNOSIS — D631 Anemia in chronic kidney disease: Secondary | ICD-10-CM | POA: Diagnosis not present

## 2019-11-03 ENCOUNTER — Other Ambulatory Visit: Payer: Self-pay

## 2019-11-03 ENCOUNTER — Encounter (HOSPITAL_COMMUNITY)
Admission: RE | Admit: 2019-11-03 | Discharge: 2019-11-03 | Disposition: A | Discharge status: Home / Self Care | Payer: Medicare Other | Source: Ambulatory Visit | Attending: Cardiology | Admitting: Cardiology

## 2019-11-03 DIAGNOSIS — Z955 Presence of coronary angioplasty implant and graft: Secondary | ICD-10-CM | POA: Diagnosis not present

## 2019-11-03 DIAGNOSIS — I214 Non-ST elevation (NSTEMI) myocardial infarction: Secondary | ICD-10-CM

## 2019-11-03 LAB — GLUCOSE, CAPILLARY
Glucose-Capillary: 162 mg/dL — ABNORMAL HIGH (ref 70–99)
Glucose-Capillary: 138 mg/dL — ABNORMAL HIGH (ref 70–99)

## 2019-11-03 NOTE — Progress Notes (Signed)
Daily Session Note  Patient Details  Name: Karen West MRN: [id number] Date of Birth: 09/18/1940 Referring Provider:     CARDIAC REHAB PHASE II ORIENTATION from 10/28/2019 in [place]  Referring Provider Minus Breeding, MD      Encounter Date: 11/03/2019  Check In:  Session Check In - 11/03/19 1100      Check-In   Supervising physician immediately available to respond to emergencies [facility] immediately available    Physician(s) Dr. Alfredia West    Location [facility]    Staff Present Karen Rubenstein, MS, EP-C, CCRP;Karen Nevels, MS,ACSM CEP, Exercise Physiologist;Karen Rollene Rotunda, RN, Karen Melnick, RN, BSN    Virtual Visit No    Medication changes reported     No    Fall or balance concerns reported    No    Tobacco Cessation No Change    Warm-up and Cool-down Performed on first and last piece of equipment    Resistance Training Performed Yes    VAD Patient? No      Pain Assessment   Currently in Pain? No/denies    Pain Score 0-No pain    Multiple Pain Sites No           Capillary Blood Glucose: Results for orders placed or performed during the hospital encounter of 11/03/19 (from the past 24 hour(s))  Glucose, capillary     Status: Abnormal   Collection Time: 11/03/19 10:38 AM  Result Value Ref Range   Glucose-Capillary 162 (H) 70 - 99 mg/dL  Glucose, capillary     Status: Abnormal   Collection Time: 11/03/19 11:33 AM  Result Value Ref Range   Glucose-Capillary 138 (H) 70 - 99 mg/dL     Exercise Prescription Changes - 11/03/19 1300      Response to Exercise   Blood Pressure (Admit) 132/56    Blood Pressure (Exercise) 122/68    Blood Pressure (Exit) 100/60    Heart Rate (Admit) 93 bpm    Heart Rate (Exercise) 101 bpm    Heart Rate (Exit) 70 bpm    Rating of Perceived Exertion (Exercise) 12    Perceived Dyspnea (Exercise) 0    Symptoms None    Comments Pt's first day of exercise     Duration  Progress to 10 minutes continuous walking  at current work load and total walking time to 30-45 min    Intensity THRR unchanged      Progression   Progression Continue to progress workloads to maintain intensity without signs/symptoms of physical distress.    Average METs 2.2      Resistance Training   Training Prescription Yes    Weight 2lbs    Reps 10-15    Time 15 Minutes      Interval Training   Interval Training No      NuStep   Level 1    SPM 85    Minutes 30    METs 2.2           Social History   Tobacco Use  Smoking Status Never Smoker  Smokeless Tobacco Never Used    Goals Met:  Exercise tolerated well No report of cardiac concerns or symptoms  Goals Unmet:  Not Applicable  Comments: Karen West started cardiac rehab today.  Pt tolerated light exercise without difficulty. VSS, telemetry-Sinus Rhythm, Bundle Branch Block, asymptomatic.  Medication list reconciled. Pt denies barriers to medicaiton compliance.  PSYCHOSOCIAL ASSESSMENT:  PHQ-0. Pt exhibits positive coping skills, hopeful  outlook with supportive family. No psychosocial needs identified at this time, no psychosocial interventions necessary.    Pt enjoys reading and playing cards.   Pt oriented to exercise equipment and routine.    Understanding verbalized.Karen Pall, RN,BSN 11/03/2019 1:27 PM   Dr. Fransico West is Medical Director for Cardiac Rehab at [facility]

## 2019-11-04 DIAGNOSIS — I2510 Atherosclerotic heart disease of native coronary artery without angina pectoris: Secondary | ICD-10-CM | POA: Diagnosis not present

## 2019-11-04 DIAGNOSIS — Z0000 Encounter for general adult medical examination without abnormal findings: Secondary | ICD-10-CM | POA: Diagnosis not present

## 2019-11-04 DIAGNOSIS — E785 Hyperlipidemia, unspecified: Secondary | ICD-10-CM | POA: Diagnosis not present

## 2019-11-04 DIAGNOSIS — D649 Anemia, unspecified: Secondary | ICD-10-CM | POA: Diagnosis not present

## 2019-11-04 DIAGNOSIS — N1831 Chronic kidney disease, stage 3a: Secondary | ICD-10-CM | POA: Diagnosis not present

## 2019-11-04 DIAGNOSIS — I6523 Occlusion and stenosis of bilateral carotid arteries: Secondary | ICD-10-CM | POA: Diagnosis not present

## 2019-11-04 DIAGNOSIS — E039 Hypothyroidism, unspecified: Secondary | ICD-10-CM | POA: Diagnosis not present

## 2019-11-04 DIAGNOSIS — K862 Cyst of pancreas: Secondary | ICD-10-CM | POA: Diagnosis not present

## 2019-11-04 DIAGNOSIS — E1165 Type 2 diabetes mellitus with hyperglycemia: Secondary | ICD-10-CM | POA: Diagnosis not present

## 2019-11-04 DIAGNOSIS — E1121 Type 2 diabetes mellitus with diabetic nephropathy: Secondary | ICD-10-CM | POA: Diagnosis not present

## 2019-11-04 DIAGNOSIS — N281 Cyst of kidney, acquired: Secondary | ICD-10-CM | POA: Diagnosis not present

## 2019-11-04 DIAGNOSIS — I10 Essential (primary) hypertension: Secondary | ICD-10-CM | POA: Diagnosis not present

## 2019-11-05 ENCOUNTER — Other Ambulatory Visit: Payer: Self-pay

## 2019-11-05 ENCOUNTER — Encounter (HOSPITAL_COMMUNITY)
Admission: RE | Admit: 2019-11-05 | Discharge: 2019-11-05 | Disposition: A | Discharge status: Home / Self Care | Payer: Medicare Other | Source: Ambulatory Visit | Attending: Cardiology | Admitting: Cardiology

## 2019-11-05 DIAGNOSIS — I214 Non-ST elevation (NSTEMI) myocardial infarction: Secondary | ICD-10-CM | POA: Diagnosis not present

## 2019-11-05 DIAGNOSIS — Z955 Presence of coronary angioplasty implant and graft: Secondary | ICD-10-CM

## 2019-11-05 LAB — GLUCOSE, CAPILLARY: Glucose-Capillary: 187 mg/dL — ABNORMAL HIGH (ref 70–99)

## 2019-11-07 ENCOUNTER — Other Ambulatory Visit: Payer: Self-pay

## 2019-11-07 ENCOUNTER — Encounter: Payer: Self-pay | Admitting: Podiatry

## 2019-11-07 ENCOUNTER — Encounter (HOSPITAL_COMMUNITY)
Admission: RE | Admit: 2019-11-07 | Discharge: 2019-11-07 | Disposition: A | Discharge status: Home / Self Care | Payer: Medicare Other | Source: Ambulatory Visit | Attending: Cardiology | Admitting: Cardiology

## 2019-11-07 ENCOUNTER — Ambulatory Visit (INDEPENDENT_AMBULATORY_CARE_PROVIDER_SITE_OTHER): Payer: Medicare Other | Admitting: Podiatry

## 2019-11-07 DIAGNOSIS — E0840 Diabetes mellitus due to underlying condition with diabetic neuropathy, unspecified: Secondary | ICD-10-CM | POA: Diagnosis not present

## 2019-11-07 DIAGNOSIS — I214 Non-ST elevation (NSTEMI) myocardial infarction: Secondary | ICD-10-CM

## 2019-11-07 DIAGNOSIS — B351 Tinea unguium: Secondary | ICD-10-CM

## 2019-11-07 DIAGNOSIS — Z955 Presence of coronary angioplasty implant and graft: Secondary | ICD-10-CM | POA: Diagnosis not present

## 2019-11-07 DIAGNOSIS — M79675 Pain in left toe(s): Secondary | ICD-10-CM

## 2019-11-07 DIAGNOSIS — M79674 Pain in right toe(s): Secondary | ICD-10-CM | POA: Diagnosis not present

## 2019-11-07 LAB — GLUCOSE, CAPILLARY: Glucose-Capillary: 218 mg/dL — ABNORMAL HIGH (ref 70–99)

## 2019-11-07 NOTE — Progress Notes (Signed)
Karen West 79 y.o. female Nutrition Note   Visit Diagnosis: NSTEMI (non-ST elevated myocardial infarction) (HCC),07/02/19  S/P DES x 4 07/03/19  Past Medical History:  Diagnosis Date  . Atherosclerosis of aorta ([place])    a. 01/2017/03/2017 - noted on high res chest CTs.  . Breast cancer ([place])    a. Bilateral --> s/p left mastectomy  . Carotid artery disease ([place])    a. 08/2829 w/ 51-76% LICA stenosis and <16% RICA stenosis; b. 10/2015 Carotid U/S: < 50% BICA stenosis  . Chest pain    a. 09/2011 MV: EF 68%, no ischemia/infarct.  . Chronic anemia   . Chronic headaches    denies  . Coronary artery calcification seen on CT scan    a. 01/2017 High res CT: atherosclerotic calcification of the arterial vascularture, including severe involvement of the coronary arteries; b. 03/2017 CT Chest: coronary and Ao atheroscelrosis.  Marland Kitchen GERD (gastroesophageal reflux disease)   . History of echocardiogram    a. 09/2011 Echo: EF 55-60%, no rwma, triv AI, PASP 64mmHg.  Marland Kitchen Hyperlipidemia   . Hypertension   . Hyperthyroidism   . Left upper lobe pulmonary nodule    a. 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 05/2017 s/p VATS.  . Multinodular goiter    a. 02/2017 PET scan- Hypermetabolic nodule;  b. 0/6269 s/p thyroidectomy  . Obesity   . OSA on CPAP    cpap  . Osteoarthritis   . Personal history of radiation therapy 1999  . Right bundle branch block   . Type II or unspecified type diabetes mellitus without mention of complication, uncontrolled      Medications reviewed.   Current Outpatient Medications:  .  acetaminophen (TYLENOL) 500 MG tablet, Take 1,000 mg by mouth every 6 (six) hours as needed for moderate pain or headache., Disp: , Rfl:  .  Alirocumab (PRALUENT) 150 MG/ML SOAJ, Inject 1 Dose into the skin every 14 (fourteen) days., Disp: 2 pen, Rfl: 11 .  amLODipine (NORVASC) 10 MG tablet, Take 10 mg by  mouth daily., Disp: , Rfl:  .  aspirin 81 MG tablet, Take 81 mg by mouth daily.  , Disp: , Rfl:  .  cholecalciferol (VITAMIN D3) 25 MCG (1000 UNIT) tablet, Take 2,000 Units by mouth daily., Disp: , Rfl:  .  Cobalamin Combinations (B-12) [phone] MCG SUBL, Place 1 tablet under the tongue daily. , Disp: , Rfl:  .  Empagliflozin-linaGLIPtin (GLYXAMBI) 10-5 MG TABS, Take by mouth., Disp: , Rfl:  .  hydrocortisone cream 1 %, Apply 1 application topically 2 (two) times daily as needed for itching. , Disp: , Rfl:  .  levothyroxine (SYNTHROID, LEVOTHROID) 88 MCG tablet, Take 88 mcg by mouth daily., Disp: , Rfl: 5 .  metoprolol tartrate (LOPRESSOR) 50 MG tablet, Take 0.5 tablets (25 mg total) by mouth 2 (two) times daily. MAY TAKE EXTRA 25MG  MAY TAKE 2 ADDITIONAL TIMES DAILY, Disp: 60 tablet, Rfl: 3 .  nitroGLYCERIN (NITROSTAT) 0.4 MG SL tablet, Place 1 tablet (0.4 mg total) under the tongue every 5 (five) minutes as needed for chest pain., Disp: 25 tablet, Rfl: 3 .  Omega-3 Fatty Acids (FISH OIL) 1200 MG CAPS, Take 1,200 mg by mouth 2 (two) times daily. , Disp: , Rfl:  .  omeprazole (PRILOSEC) 20 MG capsule, Take 20 mg by mouth 2 (two) times daily. Takes prn, Disp: , Rfl:  .  ticagrelor (BRILINTA) 90 MG TABS  tablet, Take 1 tablet (90 mg total) by mouth 2 (two) times daily., Disp: 180 tablet, Rfl: 3   Ht Readings from Last 1 Encounters:  10/28/19 5\' 1"  (1.549 m)     Wt Readings from Last 3 Encounters:  10/28/19 144 lb 13.5 oz (65.7 kg)  10/28/19 144 lb 6.4 oz (65.5 kg)  10/08/19 146 lb (66.2 kg)     There is no height or weight on file to calculate BMI.   Social History   Tobacco Use  Smoking Status Never Smoker  Smokeless Tobacco Never Used     Lab Results  Component Value Date   CHOL 121 07/03/2019   Lab Results  Component Value Date   HDL 37 (L) 07/03/2019   Lab Results  Component Value Date   LDLCALC 61 07/03/2019   Lab Results  Component Value Date   TRIG 113 07/03/2019      Lab Results  Component Value Date   HGBA1C 7.3 (H) 07/02/2019     CBG (last 3)  Recent Labs    11/05/19 1037  GLUCAP 187*     Nutrition Note  Spoke with pt. Nutrition Plan and Nutrition Survey goals reviewed with pt. Pt is following a Heart Healthy diet.  Reviewed recent lipid panel LDL 199 mg/dl after pt was off cholesterol medications. Discussed diet changes per pt request. She is interested in reducing saturated fat and increasing fiber.   Pt has Type 2 Diabetes. Pt reports most recent A1C was 8.0. She recently had metformin and jardiance discontinued. She is now taking Glyxambi.  GFR 45 ml/min in June.  Pt checks CBG's 1 times a day. Fasting CBG's reportedly 120-130 mg/dL. She would like to see it between 80-100 mg/dl. She does check post prandial occasionally ranging180-190 mg/dl.    She does not eat out often. She does not use the salt shaker and she reads labels. She has been incorporating more fiber since STEMI in effort to lower cholesterol.   Pt expressed understanding of the information reviewed.    Nutrition Diagnosis ? Excessive carbohydrate intake related to high intake of convenience foods as evidenced by A1C 8.0 and report of eating nabs/chips/cookies for snacks/lunch  Nutrition Intervention ? Pt's individual nutrition plan reviewed with pt. ? Benefits of adopting Heart Healthy diet discussed when Med[employer]icts reviewed.   ? Pt given handouts for: ? Nutrition I class ? ? Continue client-centered nutrition education by RD, as part of interdisciplinary care.  Goal(s) ? Pt to build a healthy plate including vegetables, fruits, whole grains, and low-fat dairy products in a heart healthy meal plan. ? Improved blood glucose control as evidenced by pt's A1c trending from 8.0 toward less than 7.0. ? Pt to increase fiber intake to meet daily recommendations to lower LDL cholesterol  Plan:    Will provide client-centered nutrition education as part of  interdisciplinary care  Monitor and evaluate progress toward nutrition goal with team.   Michaele Offer, MS, RDN, LDN

## 2019-11-08 NOTE — Progress Notes (Signed)
Subjective: Karen West presents today for follow up of preventative diabetic foot care and painful mycotic nails b/l that are difficult to trim. Pain interferes with ambulation. Aggravating factors include wearing enclosed shoe gear. Pain is relieved with periodic professional debridement.   She states she had to have diabetic shoes modified. She voices no new pedal concerns on today's visit.  Allergies  Allergen Reactions  . Tramadol Nausea And Vomiting and Other (See Comments)    "got sick to my stomach and was throwing up blood; it put me in the hospital")  . Lisinopril Rash and Other (See Comments)    Renal failure   . Tapazole [Methimazole] Itching and Rash     Objective: There were no vitals filed for this visit.  Pt is a pleasant 79 y.o. year old AA female in NAD. AAO x 3.   Vascular Examination:  Capillary fill time to digits <3 seconds b/l. Faintly palpable DP pulses b/l. Faintly palpable PT pulses b/l. Pedal hair present b/l. Skin temperature gradient within normal limits b/l.  Dermatological Examination: Pedal skin with normal turgor, texture and tone bilaterally. No open wounds bilaterally. No interdigital macerations bilaterally. Toenails 1-5 b/l elongated, dystrophic, thickened, crumbly with subungual debris and tenderness to dorsal palpation.  Musculoskeletal: Normal muscle strength 5/5 to all lower extremity muscle groups bilaterally, no pain crepitus or joint limitation noted with ROM b/l, bunion deformity noted b/l and hammertoes noted to the  2-5 bilaterally.  Neurological: Protective sensation intact 5/5 intact bilaterally with 10g monofilament b/l. Vibratory sensation intact b/l.  Assessment: 1. Pain due to onychomycosis of toenails of both feet   2. Diabetes mellitus due to underlying condition with diabetic neuropathy, unspecified whether long term insulin use ([place])     Plan: -No new findings. No new orders. -Continue diabetic foot care  principles. -Toenails 1-5 b/l were debrided in length and girth with sterile nail nippers and dremel without iatrogenic bleeding.  -Patient to report any pedal injuries to medical professional immediately. -Patient to continue soft, supportive shoe gear daily. -Patient/POA to call should there be question/concern in the interim.  Return in about 3 months (around 02/07/2020).

## 2019-11-10 ENCOUNTER — Other Ambulatory Visit: Payer: Self-pay

## 2019-11-10 ENCOUNTER — Encounter (HOSPITAL_COMMUNITY)
Admission: RE | Admit: 2019-11-10 | Discharge: 2019-11-10 | Disposition: A | Discharge status: Home / Self Care | Payer: Medicare Other | Source: Ambulatory Visit | Attending: Cardiology | Admitting: Cardiology

## 2019-11-10 DIAGNOSIS — I214 Non-ST elevation (NSTEMI) myocardial infarction: Secondary | ICD-10-CM | POA: Diagnosis not present

## 2019-11-10 DIAGNOSIS — Z955 Presence of coronary angioplasty implant and graft: Secondary | ICD-10-CM | POA: Diagnosis not present

## 2019-11-11 ENCOUNTER — Ambulatory Visit
Admission: RE | Admit: 2019-11-11 | Discharge: 2019-11-11 | Disposition: A | Discharge status: Home / Self Care | Payer: Medicare Other | Source: Ambulatory Visit | Attending: Urology | Admitting: Urology

## 2019-11-11 DIAGNOSIS — K449 Diaphragmatic hernia without obstruction or gangrene: Secondary | ICD-10-CM | POA: Diagnosis not present

## 2019-11-11 DIAGNOSIS — N281 Cyst of kidney, acquired: Secondary | ICD-10-CM | POA: Diagnosis not present

## 2019-11-11 DIAGNOSIS — K8689 Other specified diseases of pancreas: Secondary | ICD-10-CM | POA: Diagnosis not present

## 2019-11-11 DIAGNOSIS — K862 Cyst of pancreas: Secondary | ICD-10-CM | POA: Diagnosis not present

## 2019-11-11 MED ORDER — GADOBENATE DIMEGLUMINE 529 MG/ML IV SOLN
13.0000 mL | Freq: Once | INTRAVENOUS | Status: AC | PRN
  Administered 2019-11-11: 13 mL via INTRAVENOUS

## 2019-11-12 ENCOUNTER — Encounter (HOSPITAL_COMMUNITY)
Admission: RE | Admit: 2019-11-12 | Discharge: 2019-11-12 | Disposition: A | Discharge status: Home / Self Care | Payer: Medicare Other | Source: Ambulatory Visit | Attending: Cardiology | Admitting: Cardiology

## 2019-11-12 ENCOUNTER — Other Ambulatory Visit: Payer: Self-pay

## 2019-11-12 DIAGNOSIS — Z955 Presence of coronary angioplasty implant and graft: Secondary | ICD-10-CM

## 2019-11-12 DIAGNOSIS — I214 Non-ST elevation (NSTEMI) myocardial infarction: Secondary | ICD-10-CM | POA: Diagnosis not present

## 2019-11-14 ENCOUNTER — Telehealth: Payer: Self-pay | Admitting: Cardiology

## 2019-11-14 ENCOUNTER — Other Ambulatory Visit: Payer: Self-pay

## 2019-11-14 ENCOUNTER — Encounter (HOSPITAL_COMMUNITY)
Admission: RE | Admit: 2019-11-14 | Discharge: 2019-11-14 | Disposition: A | Discharge status: Home / Self Care | Payer: Medicare Other | Source: Ambulatory Visit | Attending: Cardiology | Admitting: Cardiology

## 2019-11-14 DIAGNOSIS — Z955 Presence of coronary angioplasty implant and graft: Secondary | ICD-10-CM | POA: Diagnosis not present

## 2019-11-14 DIAGNOSIS — I214 Non-ST elevation (NSTEMI) myocardial infarction: Secondary | ICD-10-CM

## 2019-11-14 NOTE — Progress Notes (Signed)
Patient is here for exercise at cardiac rehab. Both areas around [place] eye appear puffy especially on the left side. Patient denies any complaints to during exercise. Vital signs stable. Dr Pennie Banter office called and notified. Dr Pennie Banter office to call the patient regarding this. Patient left cardiac rehab. Without complaints or symptoms.Barnet Pall, RN,BSN 11/14/2019 11:42 AM

## 2019-11-14 NOTE — Telephone Encounter (Signed)
Cardiac Rehab nurse called. The patient was at rehab today and the Rehab nurse stated that the patient's eyes are puffier than normal. The Rehab Nurse noted that the patient has just started Praulent. The patient is not in any sort of distress. The Cardiac Rehab nurse just wanted to make the provider aware of her symptoms

## 2019-11-17 ENCOUNTER — Other Ambulatory Visit: Payer: Self-pay

## 2019-11-17 ENCOUNTER — Encounter (HOSPITAL_COMMUNITY)
Admission: RE | Admit: 2019-11-17 | Discharge: 2019-11-17 | Disposition: A | Discharge status: Home / Self Care | Payer: Medicare Other | Source: Ambulatory Visit | Attending: Cardiology | Admitting: Cardiology

## 2019-11-17 DIAGNOSIS — Z955 Presence of coronary angioplasty implant and graft: Secondary | ICD-10-CM | POA: Diagnosis not present

## 2019-11-17 DIAGNOSIS — I214 Non-ST elevation (NSTEMI) myocardial infarction: Secondary | ICD-10-CM | POA: Diagnosis not present

## 2019-11-19 ENCOUNTER — Other Ambulatory Visit: Payer: Self-pay

## 2019-11-19 ENCOUNTER — Encounter (HOSPITAL_COMMUNITY)
Admission: RE | Admit: 2019-11-19 | Discharge: 2019-11-19 | Disposition: A | Discharge status: Home / Self Care | Payer: Medicare Other | Source: Ambulatory Visit | Attending: Cardiology | Admitting: Cardiology

## 2019-11-19 DIAGNOSIS — I214 Non-ST elevation (NSTEMI) myocardial infarction: Secondary | ICD-10-CM | POA: Diagnosis not present

## 2019-11-19 DIAGNOSIS — Z955 Presence of coronary angioplasty implant and graft: Secondary | ICD-10-CM

## 2019-11-21 ENCOUNTER — Other Ambulatory Visit: Payer: Self-pay

## 2019-11-21 ENCOUNTER — Encounter (HOSPITAL_COMMUNITY)
Admission: RE | Admit: 2019-11-21 | Discharge: 2019-11-21 | Disposition: A | Discharge status: Home / Self Care | Payer: Medicare Other | Source: Ambulatory Visit | Attending: Cardiology | Admitting: Cardiology

## 2019-11-21 VITALS — Wt 146.2 lb

## 2019-11-21 DIAGNOSIS — Z955 Presence of coronary angioplasty implant and graft: Secondary | ICD-10-CM

## 2019-11-21 DIAGNOSIS — I214 Non-ST elevation (NSTEMI) myocardial infarction: Secondary | ICD-10-CM | POA: Diagnosis not present

## 2019-11-25 DIAGNOSIS — Z8739 Personal history of other diseases of the musculoskeletal system and connective tissue: Secondary | ICD-10-CM | POA: Diagnosis not present

## 2019-11-25 DIAGNOSIS — I10 Essential (primary) hypertension: Secondary | ICD-10-CM | POA: Diagnosis not present

## 2019-11-25 DIAGNOSIS — Z79899 Other long term (current) drug therapy: Secondary | ICD-10-CM | POA: Diagnosis not present

## 2019-11-25 DIAGNOSIS — E1121 Type 2 diabetes mellitus with diabetic nephropathy: Secondary | ICD-10-CM | POA: Diagnosis not present

## 2019-11-25 DIAGNOSIS — E785 Hyperlipidemia, unspecified: Secondary | ICD-10-CM | POA: Diagnosis not present

## 2019-11-25 DIAGNOSIS — E038 Other specified hypothyroidism: Secondary | ICD-10-CM | POA: Diagnosis not present

## 2019-11-25 DIAGNOSIS — I6523 Occlusion and stenosis of bilateral carotid arteries: Secondary | ICD-10-CM | POA: Diagnosis not present

## 2019-11-25 DIAGNOSIS — I252 Old myocardial infarction: Secondary | ICD-10-CM | POA: Diagnosis not present

## 2019-11-25 DIAGNOSIS — E1165 Type 2 diabetes mellitus with hyperglycemia: Secondary | ICD-10-CM | POA: Diagnosis not present

## 2019-11-25 DIAGNOSIS — N1831 Chronic kidney disease, stage 3a: Secondary | ICD-10-CM | POA: Diagnosis not present

## 2019-11-26 ENCOUNTER — Encounter (HOSPITAL_COMMUNITY)
Admission: RE | Admit: 2019-11-26 | Discharge: 2019-11-26 | Disposition: A | Discharge status: Home / Self Care | Payer: Medicare Other | Source: Ambulatory Visit | Attending: Cardiology | Admitting: Cardiology

## 2019-11-26 ENCOUNTER — Other Ambulatory Visit: Payer: Self-pay

## 2019-11-26 DIAGNOSIS — Z955 Presence of coronary angioplasty implant and graft: Secondary | ICD-10-CM

## 2019-11-26 DIAGNOSIS — I214 Non-ST elevation (NSTEMI) myocardial infarction: Secondary | ICD-10-CM | POA: Diagnosis not present

## 2019-11-26 NOTE — Progress Notes (Signed)
I have reviewed a Home Exercise Prescription with Annamaria Boots . Zianne is currently exercising at home. The patient was advised to continue to walk 4-5 days a week for 5-15 minutes.  Deneise Lever and I discussed how to progress their exercise prescription. The patient states she is now able to walk for 10 minutes at home without rest breaks and that she is getting stronger. The patient stated that they understand the exercise prescription. We reviewed exercise guidelines, target heart rate during exercise, RPE Scale, weather conditions, NTG use, endpoints for exercise, warmup and cool down. Patient is encouraged to come to me with any questions. I will continue to follow up with the patient to assist them with progression and safety.    Carma Lair MS, CEP CSCS 2:46 PM 11/26/2019

## 2019-11-27 NOTE — Progress Notes (Signed)
Cardiac Individual Treatment Plan  Patient Details  Name: Karen West MRN: [id number] Date of Birth: 1940-07-19 Referring Provider:     CARDIAC REHAB PHASE II ORIENTATION from 10/28/2019 in [facility]  Referring Provider Minus Breeding, MD      Initial Encounter Date:    CARDIAC REHAB PHASE II ORIENTATION from 10/28/2019 in [facility]  Date 10/28/19      Visit Diagnosis: NSTEMI (non-ST elevated myocardial infarction) (HCC),07/02/19  S/P DES x 4 07/03/19  Patient's Home Medications on Admission:  Current Outpatient Medications:  .  acetaminophen (TYLENOL) 500 MG tablet, Take 1,000 mg by mouth every 6 (six) hours as needed for moderate pain or headache., Disp: , Rfl:  .  Alirocumab (PRALUENT) 150 MG/ML SOAJ, Inject 1 Dose into the skin every 14 (fourteen) days., Disp: 2 pen, Rfl: 11 .  amLODipine (NORVASC) 10 MG tablet, Take 10 mg by mouth daily., Disp: , Rfl:  .  aspirin 81 MG tablet, Take 81 mg by mouth daily.  , Disp: , Rfl:  .  cholecalciferol (VITAMIN D3) 25 MCG (1000 UNIT) tablet, Take 2,000 Units by mouth daily., Disp: , Rfl:  .  Cobalamin Combinations (B-12) [phone] MCG SUBL, Place 1 tablet under the tongue daily. , Disp: , Rfl:  .  Empagliflozin-linaGLIPtin (GLYXAMBI) 10-5 MG TABS, Take by mouth., Disp: , Rfl:  .  hydrocortisone cream 1 %, Apply 1 application topically 2 (two) times daily as needed for itching. , Disp: , Rfl:  .  levothyroxine (SYNTHROID, LEVOTHROID) 88 MCG tablet, Take 88 mcg by mouth daily., Disp: , Rfl: 5 .  metoprolol tartrate (LOPRESSOR) 50 MG tablet, Take 0.5 tablets (25 mg total) by mouth 2 (two) times daily. MAY TAKE EXTRA 25MG  MAY TAKE 2 ADDITIONAL TIMES DAILY, Disp: 60 tablet, Rfl: 3 .  nitroGLYCERIN (NITROSTAT) 0.4 MG SL tablet, Place 1 tablet (0.4 mg total) under the tongue every 5 (five) minutes as needed for chest pain., Disp: 25 tablet, Rfl: 3 .  Omega-3 Fatty Acids (FISH OIL)  1200 MG CAPS, Take 1,200 mg by mouth 2 (two) times daily. , Disp: , Rfl:  .  omeprazole (PRILOSEC) 20 MG capsule, Take 20 mg by mouth 2 (two) times daily. Takes prn, Disp: , Rfl:  .  ticagrelor (BRILINTA) 90 MG TABS tablet, Take 1 tablet (90 mg total) by mouth 2 (two) times daily., Disp: 180 tablet, Rfl: 3  Past Medical History: Past Medical History:  Diagnosis Date  . Atherosclerosis of aorta ([place])    a. 01/2017/03/2017 - noted on high res chest CTs.  . Breast cancer ([place])    a. Bilateral --> s/p left mastectomy  . Carotid artery disease ([place])    a. 0/9233 w/ 00-76% LICA stenosis and <22% RICA stenosis; b. 10/2015 Carotid U/S: < 50% BICA stenosis  . Chest pain    a. 09/2011 MV: EF 68%, no ischemia/infarct.  . Chronic anemia   . Chronic headaches    denies  . Coronary artery calcification seen on CT scan    a. 01/2017 High res CT: atherosclerotic calcification of the arterial vascularture, including severe involvement of the coronary arteries; b. 03/2017 CT Chest: coronary and Ao atheroscelrosis.  Marland Kitchen GERD (gastroesophageal reflux disease)   . History of echocardiogram    a. 09/2011 Echo: EF 55-60%, no rwma, triv AI, PASP 10mmHg.  Marland Kitchen Hyperlipidemia   . Hypertension   . Hyperthyroidism   . Left upper lobe pulmonary nodule  a. 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 05/2017 s/p VATS.  . Multinodular goiter    a. 02/2017 PET scan- Hypermetabolic nodule;  b. 0/6237 s/p thyroidectomy  . Obesity   . OSA on CPAP    cpap  . Osteoarthritis   . Personal history of radiation therapy 1999  . Right bundle branch block   . Type II or unspecified type diabetes mellitus without mention of complication, uncontrolled     Tobacco Use: Social History   Tobacco Use  Smoking Status Never Smoker  Smokeless Tobacco Never Used    Labs: Recent Review Flowsheet Data    Labs for ITP Cardiac and Pulmonary Rehab  Latest Ref Rng & Units 05/08/2017 05/31/2017 06/05/2017 07/02/2019 07/03/2019   Cholestrol 0 - 200 mg/dL - - - - 121   LDLCALC 0 - 99 mg/dL - - - - 61   HDL >40 mg/dL - - - - 37(L)   Trlycerides <150 mg/dL - - - - 113   Hemoglobin A1c 4.8 - 5.6 % 6.9(H) - - 7.3(H) -   PHART 7.35 - 7.45 - 7.465(H) 7.460(H) - -   PCO2ART 32 - 48 mmHg - 36.8 37.2 - -   HCO3 20.0 - 28.0 mmol/L - 26.1 26.2 - -   O2SAT % - 98.5 95.1 - -      Capillary Blood Glucose: Lab Results  Component Value Date   GLUCAP 218 (H) 11/07/2019   GLUCAP 187 (H) 11/05/2019   GLUCAP 138 (H) 11/03/2019   GLUCAP 162 (H) 11/03/2019   GLUCAP 248 (H) 08/26/2019     Exercise Target Goals: Exercise Program Goal: Individual exercise prescription set using results from initial 6 min walk test and THRR while considering  patient's activity barriers and safety.   Exercise Prescription Goal: Starting with aerobic activity 30 plus minutes a day, 3 days per week for initial exercise prescription. Provide home exercise prescription and guidelines that participant acknowledges understanding prior to discharge.  Activity Barriers & Risk Stratification:  Activity Barriers & Cardiac Risk Stratification - 10/28/19 0916      Activity Barriers & Cardiac Risk Stratification   Activity Barriers Arthritis;Deconditioning   Pt has bilateral knee pain at times   Cardiac Risk Stratification High           6 Minute Walk:  6 Minute Walk    Row Name 10/28/19 0958         6 Minute Walk   Phase Initial     Distance 957 feet     Walk Time 6 minutes     # of Rest Breaks 0     MPH 1.81     METS 2.02     RPE 11     Perceived Dyspnea  0     VO2 Peak 7.07     Symptoms Yes (comment)     Comments Fatigue     Resting HR 67 bpm     Resting BP 110/60     Resting Oxygen Saturation  100 %     Exercise Oxygen Saturation  during 6 min walk 99 %     Max Ex. HR 114 bpm     Max Ex. BP 140/66     2 Minute Post BP 122/64            Oxygen  Initial Assessment:   Oxygen Re-Evaluation:   Oxygen Discharge (Final Oxygen Re-Evaluation):   Initial Exercise Prescription:  Initial Exercise Prescription - 10/28/19 0900      Date of Initial Exercise RX and Referring Provider   Date 10/28/19    Referring Provider Minus Breeding, MD    Expected Discharge Date 12/26/19      NuStep   Level 1    SPM 75    Minutes 30    METs 1.7      Prescription Details   Frequency (times per week) 3    Duration Progress to 30 minutes of continuous aerobic without signs/symptoms of physical distress      Intensity   THRR 40-80% of Max Heartrate 57-114    Ratings of Perceived Exertion 11-13    Perceived Dyspnea 0-4      Progression   Progression Continue progressive overload as per policy without signs/symptoms or physical distress.      Resistance Training   Training Prescription Yes    Weight 2lbs    Reps 10-15           Perform Capillary Blood Glucose checks as needed.  Exercise Prescription Changes:  Exercise Prescription Changes    Row Name 11/03/19 1300 11/26/19 1400           Response to Exercise   Blood Pressure (Admit) 132/56 134/60      Blood Pressure (Exercise) 122/68 142/62      Blood Pressure (Exit) 100/60 102/60      Heart Rate (Admit) 93 bpm 95 bpm      Heart Rate (Exercise) 101 bpm 109 bpm      Heart Rate (Exit) 70 bpm 89 bpm      Rating of Perceived Exertion (Exercise) 12 13      Perceived Dyspnea (Exercise) 0 0      Symptoms None None      Comments Pt's first day of exercise  Reviewed Home Exercise Plan      Duration Progress to 10 minutes continuous walking  at current work load and total walking time to 30-45 min Progress to 30 minutes of  aerobic without signs/symptoms of physical distress      Intensity THRR unchanged THRR unchanged        Progression   Progression Continue to progress workloads to maintain intensity without signs/symptoms of physical distress. Continue to progress workloads to  maintain intensity without signs/symptoms of physical distress.      Average METs 2.2 3.3        Resistance Training   Training Prescription Yes No      Weight 2lbs --      Reps 10-15 --      Time 15 Minutes --        Interval Training   Interval Training No No        NuStep   Level 1 2      SPM 85 95      Minutes 30 30      METs 2.2 3.3        Home Exercise Plan   Plans to continue exercise at -- Home (comment)  Walking      Frequency -- Add 3 additional days to program exercise sessions.      Initial Home Exercises Provided -- 11/26/19             Exercise Comments:  Exercise Comments    Row Name 11/03/19 1311 11/26/19 1448         Exercise Comments Pt's first day of exercise. Pt responded well to exercise prescription. Will continue to  monitor pt. Reviewed Home Exercise Plan. Pt is currently walking at home daily 5-15 minutes. Pt states she is feeling stronger and is able to walk for longer periods of time with no rest breaks. Will continue to monitor and progress pt as tolerated.             Exercise Goals and Review:  Exercise Goals    Row Name 10/28/19 0240             Exercise Goals   Increase Physical Activity Yes       Intervention Provide advice, education, support and counseling about physical activity/exercise needs.;Develop an individualized exercise prescription for aerobic and resistive training based on initial evaluation findings, risk stratification, comorbidities and participant's personal goals.       Expected Outcomes Short Term: Attend rehab on a regular basis to increase amount of physical activity.;Long Term: Add in home exercise to make exercise part of routine and to increase amount of physical activity.;Long Term: Exercising regularly at least 3-5 days a week.       Increase Strength and Stamina Yes       Intervention Provide advice, education, support and counseling about physical activity/exercise needs.;Develop an individualized  exercise prescription for aerobic and resistive training based on initial evaluation findings, risk stratification, comorbidities and participant's personal goals.       Expected Outcomes Short Term: Increase workloads from initial exercise prescription for resistance, speed, and METs.;Short Term: Perform resistance training exercises routinely during rehab and add in resistance training at home;Long Term: Improve cardiorespiratory fitness, muscular endurance and strength as measured by increased METs and functional capacity (6MWT)       Able to understand and use rate of perceived exertion (RPE) scale Yes       Intervention Provide education and explanation on how to use RPE scale       Expected Outcomes Long Term:  Able to use RPE to guide intensity level when exercising independently;Short Term: Able to use RPE daily in rehab to express subjective intensity level       Knowledge and understanding of Target Heart Rate Range (THRR) Yes       Intervention Provide education and explanation of THRR including how the numbers were predicted and where they are located for reference       Expected Outcomes Short Term: Able to state/look up THRR;Long Term: Able to use THRR to govern intensity when exercising independently;Short Term: Able to use daily as guideline for intensity in rehab       Understanding of Exercise Prescription Yes       Intervention Provide education, explanation, and written materials on patient's individual exercise prescription       Expected Outcomes Short Term: Able to explain program exercise prescription;Long Term: Able to explain home exercise prescription to exercise independently              Exercise Goals Re-Evaluation :  Exercise Goals Re-Evaluation    Row Name 11/03/19 1311 11/26/19 1449           Exercise Goal Re-Evaluation   Exercise Goals Review Increase Physical Activity;Able to understand and use rate of perceived exertion (RPE) scale;Understanding of Exercise  Prescription Increase Physical Activity;Increase Strength and Stamina;Able to understand and use rate of perceived exertion (RPE) scale;Knowledge and understanding of Target Heart Rate Range (THRR);Able to check pulse independently;Understanding of Exercise Prescription      Comments Pt's first day of exercise. Pt oriented to exericse equipment. Will continue to monitor  and progress pt. Reviewed Home Exercise Plan. Also discussed THRR, RPE Scale, weather conditions, NTG use, endpoints of exercise, warmup and cool down.      Expected Outcomes Pt will work to increase her strength and stamina. Pt will continue to exercise daily. Pt will walk for 10 minute increments 2x a day. Will continue to increase cardiovascular strength.              Discharge Exercise Prescription (Final Exercise Prescription Changes):  Exercise Prescription Changes - 11/26/19 1400      Response to Exercise   Blood Pressure (Admit) 134/60    Blood Pressure (Exercise) 142/62    Blood Pressure (Exit) 102/60    Heart Rate (Admit) 95 bpm    Heart Rate (Exercise) 109 bpm    Heart Rate (Exit) 89 bpm    Rating of Perceived Exertion (Exercise) 13    Perceived Dyspnea (Exercise) 0    Symptoms None    Comments Reviewed Home Exercise Plan    Duration Progress to 30 minutes of  aerobic without signs/symptoms of physical distress    Intensity THRR unchanged      Progression   Progression Continue to progress workloads to maintain intensity without signs/symptoms of physical distress.    Average METs 3.3      Resistance Training   Training Prescription No      Interval Training   Interval Training No      NuStep   Level 2    SPM 95    Minutes 30    METs 3.3      Home Exercise Plan   Plans to continue exercise at Home (comment)   Walking   Frequency Add 3 additional days to program exercise sessions.    Initial Home Exercises Provided 11/26/19           Nutrition:  Target Goals: Understanding of nutrition  guidelines, daily intake of sodium 1500mg , cholesterol 200mg , calories 30% from fat and 7% or less from saturated fats, daily to have 5 or more servings of fruits and vegetables.  Biometrics:  Pre Biometrics - 10/28/19 0800      Pre Biometrics   Waist Circumference 38 inches    Hip Circumference 43.5 inches    Waist to Hip Ratio 0.87 %    Triceps Skinfold 25 mm    % Body Fat 40.5 %    Grip Strength 21.5 kg    Flexibility 18 in    Single Leg Stand 10.2 seconds            Nutrition Therapy Plan and Nutrition Goals:  Nutrition Therapy & Goals - 11/25/19 1001      Nutrition Therapy   Diet Heart Healthy      Personal Nutrition Goals   Nutrition Goal Pt to build a healthy plate including vegetables, fruits, whole grains, and low-fat dairy products in a heart healthy meal plan.    Personal Goal #2 Improved blood glucose control as evidenced by pt's A1c trending from 8.0 toward less than 7.0.    Personal Goal #3 Pt to increase fiber intake to meet daily recommendations to lower LDL cholesterol      Intervention Plan   Intervention Prescribe, educate and counsel regarding individualized specific dietary modifications aiming towards targeted core components such as weight, hypertension, lipid management, diabetes, heart failure and other comorbidities.;Nutrition handout(s) given to patient.    Expected Outcomes Short Term Goal: A plan has been developed with personal nutrition goals set during dietitian appointment.;Long Term  Goal: Adherence to prescribed nutrition plan.           Nutrition Assessments:  Nutrition Assessments - 11/20/19 1107      MEDFICTS Scores   Pre Score 36           Nutrition Goals Re-Evaluation:  Nutrition Goals Re-Evaluation    Row Name 11/25/19 1001             Goals   Current Weight 146 lb 2.6 oz (66.3 kg)              Nutrition Goals Discharge (Final Nutrition Goals Re-Evaluation):  Nutrition Goals Re-Evaluation - 11/25/19 1001       Goals   Current Weight 146 lb 2.6 oz (66.3 kg)           Psychosocial: Target Goals: Acknowledge presence or absence of significant depression and/or stress, maximize coping skills, provide positive support system. Participant is able to verbalize types and ability to use techniques and skills needed for reducing stress and depression.  Initial Review & Psychosocial Screening:  Initial Psych Review & Screening - 10/28/19 1235      Initial Review   Current issues with None Identified      Family Dynamics   Good Support System? Yes   Kawena lives alone. Yoshie has her children for support who lives in [place]     Barriers   Psychosocial barriers to participate in program There are no identifiable barriers or psychosocial needs.      Screening Interventions   Interventions Encouraged to exercise           Quality of Life Scores:  Quality of Life - 10/28/19 0932      Quality of Life   Select Quality of Life      Quality of Life Scores   Health/Function Pre 25.5 %    Socioeconomic Pre 29.14 %    Psych/Spiritual Pre 29.14 %    Family Pre 27 %    GLOBAL Pre 27.23 %          Scores of 19 and below usually indicate a poorer quality of life in these areas.  A difference of  2-3 points is a clinically meaningful difference.  A difference of 2-3 points in the total score of the Quality of Life Index has been associated with significant improvement in overall quality of life, self-image, physical symptoms, and general health in studies assessing change in quality of life.  PHQ-9: Recent Review Flowsheet Data    Depression screen [facility] 2/9 10/28/2019 09/12/2019 01/15/2018 10/11/2017 09/26/2017   Decreased Interest 0 0 0 0 0   Down, Depressed, Hopeless 0 0 0 0 0   PHQ - 2 Score 0 0 0 0 0   Altered sleeping - - 1  0 0   Tired, decreased energy - - 1 1 1    Change in appetite - - 2 2 3    Feeling bad or failure about yourself  - - 0 0 0   Trouble concentrating - - 0 0 0   Moving  slowly or fidgety/restless - - 0 1 0   Suicidal thoughts - - 0 0 0   PHQ-9 Score - - 4 4 4    Difficult doing work/chores - - Not difficult at all Not difficult at all Not difficult at all     Interpretation of Total Score  Total Score Depression Severity:  1-4 = Minimal depression, 5-9 = Mild depression, 10-14 = Moderate depression, 15-19 =  Moderately severe depression, 20-27 = Severe depression   Psychosocial Evaluation and Intervention:   Psychosocial Re-Evaluation:  Psychosocial Re-Evaluation    [place] Name 11/27/19 1624             Psychosocial Re-Evaluation   Current issues with None Identified       Interventions Encouraged to attend Cardiac Rehabilitation for the exercise       Continue Psychosocial Services  No Follow up required              Psychosocial Discharge (Final Psychosocial Re-Evaluation):  Psychosocial Re-Evaluation - 11/27/19 1624      Psychosocial Re-Evaluation   Current issues with None Identified    Interventions Encouraged to attend Cardiac Rehabilitation for the exercise    Continue Psychosocial Services  No Follow up required           Vocational Rehabilitation: Provide vocational rehab assistance to qualifying candidates.   Vocational Rehab Evaluation & Intervention:  Vocational Rehab - 10/28/19 1237      Initial Vocational Rehab Evaluation & Intervention   Assessment shows need for Vocational Rehabilitation No   Shalona is retired and does not need vocational rehab at this time          Education: Education Goals: Education classes will be provided on a weekly basis, covering required topics. Participant will state understanding/return demonstration of topics presented.  Learning Barriers/Preferences:  Learning Barriers/Preferences - 10/28/19 0933      Learning Barriers/Preferences   Learning Barriers Sight   Wears glasses   Learning Preferences Group Instruction;Individual Instruction;Skilled Demonstration            Education Topics: Hypertension, Hypertension Reduction -Define heart disease and high blood pressure. Discus how high blood pressure affects the body and ways to reduce high blood pressure.   Exercise and Your Heart -Discuss why it is important to exercise, the FITT principles of exercise, normal and abnormal responses to exercise, and how to exercise safely.   Angina -Discuss definition of angina, causes of angina, treatment of angina, and how to decrease risk of having angina.   Cardiac Medications -Review what the following cardiac medications are used for, how they affect the body, and side effects that may occur when taking the medications.  Medications include Aspirin, Beta blockers, calcium channel blockers, ACE Inhibitors, angiotensin receptor blockers, diuretics, digoxin, and antihyperlipidemics.   Congestive Heart Failure -Discuss the definition of CHF, how to live with CHF, the signs and symptoms of CHF, and how keep track of weight and sodium intake.   Heart Disease and Intimacy -Discus the effect sexual activity has on the heart, how changes occur during intimacy as we age, and safety during sexual activity.   Smoking Cessation / COPD -Discuss different methods to quit smoking, the health benefits of quitting smoking, and the definition of COPD.   Nutrition I: Fats -Discuss the types of cholesterol, what cholesterol does to the heart, and how cholesterol levels can be controlled.   Nutrition II: Labels -Discuss the different components of food labels and how to read food label   Heart Parts/Heart Disease and PAD -Discuss the anatomy of the heart, the pathway of blood circulation through the heart, and these are affected by heart disease.   Stress I: Signs and Symptoms -Discuss the causes of stress, how stress may lead to anxiety and depression, and ways to limit stress.   Stress II: Relaxation -Discuss different types of relaxation techniques to limit  stress.   Warning Signs of Stroke /  TIA -Discuss definition of a stroke, what the signs and symptoms are of a stroke, and how to identify when someone is having stroke.   Knowledge Questionnaire Score:  Knowledge Questionnaire Score - 10/28/19 0935      Knowledge Questionnaire Score   Pre Score 20/24           Core Components/Risk Factors/Patient Goals at Admission:  Personal Goals and Risk Factors at Admission - 10/28/19 1237      Core Components/Risk Factors/Patient Goals on Admission    Weight Management Weight Maintenance    Intervention Weight Management: Develop a combined nutrition and exercise program designed to reach desired caloric intake, while maintaining appropriate intake of nutrient and fiber, sodium and fats, and appropriate energy expenditure required for the weight goal.;Weight Management: Provide education and appropriate resources to help participant work on and attain dietary goals.;Weight Management/Obesity: Establish reasonable short term and long term weight goals.    Admit Weight 144 lb 13.5 oz (65.7 kg)    Expected Outcomes Short Term: Continue to assess and modify interventions until short term weight is achieved;Long Term: Adherence to nutrition and physical activity/exercise program aimed toward attainment of established weight goal;Weight Maintenance: Understanding of the daily nutrition guidelines, which includes 25-35% calories from fat, 7% or less cal from saturated fats, less than 200mg  cholesterol, less than 1.5gm of sodium, & 5 or more servings of fruits and vegetables daily;Weight Loss: Understanding of general recommendations for a balanced deficit meal plan, which promotes 1-2 lb weight loss per week and includes a negative energy balance of [phone] kcal/d;Understanding recommendations for meals to include 15-35% energy as protein, 25-35% energy from fat, 35-60% energy from carbohydrates, less than 200mg  of dietary cholesterol, 20-35 gm of total fiber  daily;Understanding of distribution of calorie intake throughout the day with the consumption of 4-5 meals/snacks    Diabetes Yes    Intervention Provide education about signs/symptoms and action to take for hypo/hyperglycemia.;Provide education about proper nutrition, including hydration, and aerobic/resistive exercise prescription along with prescribed medications to achieve blood glucose in normal ranges: Fasting glucose 65-99 mg/dL    Hypertension Yes    Intervention Provide education on lifestyle modifcations including regular physical activity/exercise, weight management, moderate sodium restriction and increased consumption of fresh fruit, vegetables, and low fat dairy, alcohol moderation, and smoking cessation.;Monitor prescription use compliance.    Lipids Yes    Intervention Provide education and support for participant on nutrition & aerobic/resistive exercise along with prescribed medications to achieve LDL 70mg , HDL >40mg .    Expected Outcomes Short Term: Participant states understanding of desired cholesterol values and is compliant with medications prescribed. Participant is following exercise prescription and nutrition guidelines.;Long Term: Cholesterol controlled with medications as prescribed, with individualized exercise RX and with personalized nutrition plan. Value goals: LDL < 70mg , HDL > 40 mg.           Core Components/Risk Factors/Patient Goals Review:   Goals and Risk Factor Review    Row Name 11/27/19 1625             Core Components/Risk Factors/Patient Goals Review   Personal Goals Review Weight Management/Obesity;Hypertension;Diabetes;Lipids       Review Ameliya is doing well with exercise at cardiac rehab. Laelle's vital signs and CBG's have been stable.       Expected Outcomes Syretta wil continue to participate in phase 2 cardiac rehab for exercise, nutrition and lifestyle modifications              Core Components/Risk Factors/Patient  Goals at Discharge  (Final Review):   Goals and Risk Factor Review - 11/27/19 1625      Core Components/Risk Factors/Patient Goals Review   Personal Goals Review Weight Management/Obesity;Hypertension;Diabetes;Lipids    Review Aldena is doing well with exercise at cardiac rehab. Tyiana's vital signs and CBG's have been stable.    Expected Outcomes Florance wil continue to participate in phase 2 cardiac rehab for exercise, nutrition and lifestyle modifications           ITP Comments:  ITP Comments    Row Name 10/28/19 1227 11/27/19 1623         ITP Comments Dr Fransico Him MD, Medical Director 30 Day ITP Review. Keylen is with good participation and attendance in phase 2 cardiac rehab             Comments: See ITP comments. Camber is doing well with exercise.Barnet Pall, RN,BSN 11/27/2019 4:29 PM

## 2019-11-28 ENCOUNTER — Other Ambulatory Visit: Payer: Self-pay

## 2019-11-28 ENCOUNTER — Encounter (HOSPITAL_COMMUNITY)
Admission: RE | Admit: 2019-11-28 | Discharge: 2019-11-28 | Disposition: A | Discharge status: Home / Self Care | Payer: Medicare Other | Source: Ambulatory Visit | Attending: Cardiology | Admitting: Cardiology

## 2019-11-28 DIAGNOSIS — Z955 Presence of coronary angioplasty implant and graft: Secondary | ICD-10-CM

## 2019-11-28 DIAGNOSIS — I214 Non-ST elevation (NSTEMI) myocardial infarction: Secondary | ICD-10-CM

## 2019-12-01 ENCOUNTER — Encounter (HOSPITAL_COMMUNITY)
Admission: RE | Admit: 2019-12-01 | Discharge: 2019-12-01 | Disposition: A | Discharge status: Home / Self Care | Payer: Medicare Other | Source: Ambulatory Visit | Attending: Cardiology | Admitting: Cardiology

## 2019-12-01 ENCOUNTER — Other Ambulatory Visit: Payer: Self-pay

## 2019-12-01 DIAGNOSIS — I214 Non-ST elevation (NSTEMI) myocardial infarction: Secondary | ICD-10-CM

## 2019-12-01 DIAGNOSIS — Z955 Presence of coronary angioplasty implant and graft: Secondary | ICD-10-CM | POA: Diagnosis not present

## 2019-12-03 ENCOUNTER — Encounter (HOSPITAL_COMMUNITY)
Admission: RE | Admit: 2019-12-03 | Discharge: 2019-12-03 | Disposition: A | Discharge status: Home / Self Care | Payer: Medicare Other | Source: Ambulatory Visit | Attending: Cardiology | Admitting: Cardiology

## 2019-12-03 ENCOUNTER — Other Ambulatory Visit: Payer: Self-pay

## 2019-12-03 DIAGNOSIS — Z955 Presence of coronary angioplasty implant and graft: Secondary | ICD-10-CM

## 2019-12-03 DIAGNOSIS — I214 Non-ST elevation (NSTEMI) myocardial infarction: Secondary | ICD-10-CM | POA: Diagnosis not present

## 2019-12-04 DIAGNOSIS — H04123 Dry eye syndrome of bilateral lacrimal glands: Secondary | ICD-10-CM | POA: Diagnosis not present

## 2019-12-04 DIAGNOSIS — H40013 Open angle with borderline findings, low risk, bilateral: Secondary | ICD-10-CM | POA: Diagnosis not present

## 2019-12-04 DIAGNOSIS — Z961 Presence of intraocular lens: Secondary | ICD-10-CM | POA: Diagnosis not present

## 2019-12-04 DIAGNOSIS — E119 Type 2 diabetes mellitus without complications: Secondary | ICD-10-CM | POA: Diagnosis not present

## 2019-12-05 ENCOUNTER — Other Ambulatory Visit: Payer: Self-pay

## 2019-12-05 ENCOUNTER — Encounter (HOSPITAL_COMMUNITY)
Admission: RE | Admit: 2019-12-05 | Discharge: 2019-12-05 | Disposition: A | Discharge status: Home / Self Care | Payer: Medicare Other | Source: Ambulatory Visit | Attending: Cardiology | Admitting: Cardiology

## 2019-12-05 DIAGNOSIS — I214 Non-ST elevation (NSTEMI) myocardial infarction: Secondary | ICD-10-CM | POA: Diagnosis not present

## 2019-12-05 DIAGNOSIS — Z955 Presence of coronary angioplasty implant and graft: Secondary | ICD-10-CM

## 2019-12-05 NOTE — Progress Notes (Signed)
Nutrition Note - Follow up  Checked in with pt during exercise. She continues to follow a heart healthy diet. She continues checking CBGs daily. She had questions about fried foods and eating a balanced diet.  Pt was able to verbalize understanding and asked appropriate questions. Will continue to monitor pt during cardiac rehab.  Michaele Offer, MS, RDN, LDN

## 2019-12-08 ENCOUNTER — Other Ambulatory Visit: Payer: Self-pay

## 2019-12-08 ENCOUNTER — Encounter (HOSPITAL_COMMUNITY)
Admission: RE | Admit: 2019-12-08 | Discharge: 2019-12-08 | Disposition: A | Discharge status: Home / Self Care | Payer: Medicare Other | Source: Ambulatory Visit | Attending: Cardiology | Admitting: Cardiology

## 2019-12-08 ENCOUNTER — Other Ambulatory Visit: Payer: Self-pay | Admitting: *Deleted

## 2019-12-08 DIAGNOSIS — Z955 Presence of coronary angioplasty implant and graft: Secondary | ICD-10-CM | POA: Diagnosis not present

## 2019-12-08 DIAGNOSIS — I214 Non-ST elevation (NSTEMI) myocardial infarction: Secondary | ICD-10-CM | POA: Diagnosis not present

## 2019-12-08 DIAGNOSIS — K862 Cyst of pancreas: Secondary | ICD-10-CM | POA: Diagnosis not present

## 2019-12-08 NOTE — Patient Outreach (Signed)
[place] [facility]) Care Management  12/08/2019  Karen West 1940-10-10 [id number]  Telephone outreach to re-establish communication. Left message and requested a return call.  Karen West returned my call. She is doing well. She has been working diligently on her cardiac rehab. This summer. She says it's hard but I'm pushing on.  She reports her glucose levels have come down and her FBS are running 110-110!  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 These will be followed up in 6 months.  Karen West has a positive attitude and great faith that keep her going.  We agreed to talk again in one month.   [name]. Myrtie Neither, MSN, [facility] Gerontological Nurse Practitioner [facility] Care Management [phone]

## 2019-12-10 ENCOUNTER — Other Ambulatory Visit: Payer: Self-pay

## 2019-12-10 ENCOUNTER — Encounter (HOSPITAL_COMMUNITY)
Admission: RE | Admit: 2019-12-10 | Discharge: 2019-12-10 | Disposition: A | Discharge status: Home / Self Care | Payer: Medicare Other | Source: Ambulatory Visit | Attending: Cardiology | Admitting: Cardiology

## 2019-12-10 DIAGNOSIS — Z955 Presence of coronary angioplasty implant and graft: Secondary | ICD-10-CM

## 2019-12-10 DIAGNOSIS — I214 Non-ST elevation (NSTEMI) myocardial infarction: Secondary | ICD-10-CM

## 2019-12-12 ENCOUNTER — Other Ambulatory Visit: Payer: Self-pay

## 2019-12-12 ENCOUNTER — Encounter (HOSPITAL_COMMUNITY)
Admission: RE | Admit: 2019-12-12 | Discharge: 2019-12-12 | Disposition: A | Discharge status: Home / Self Care | Payer: Medicare Other | Source: Ambulatory Visit | Attending: Cardiology | Admitting: Cardiology

## 2019-12-12 DIAGNOSIS — Z955 Presence of coronary angioplasty implant and graft: Secondary | ICD-10-CM

## 2019-12-12 DIAGNOSIS — I214 Non-ST elevation (NSTEMI) myocardial infarction: Secondary | ICD-10-CM

## 2019-12-15 ENCOUNTER — Other Ambulatory Visit: Payer: Self-pay

## 2019-12-15 ENCOUNTER — Encounter (HOSPITAL_COMMUNITY)
Admission: RE | Admit: 2019-12-15 | Discharge: 2019-12-15 | Disposition: A | Discharge status: Home / Self Care | Payer: Medicare Other | Source: Ambulatory Visit | Attending: Cardiology | Admitting: Cardiology

## 2019-12-15 DIAGNOSIS — Z955 Presence of coronary angioplasty implant and graft: Secondary | ICD-10-CM

## 2019-12-15 DIAGNOSIS — I214 Non-ST elevation (NSTEMI) myocardial infarction: Secondary | ICD-10-CM

## 2019-12-17 ENCOUNTER — Other Ambulatory Visit: Payer: Self-pay

## 2019-12-17 ENCOUNTER — Encounter (HOSPITAL_COMMUNITY)
Admission: RE | Admit: 2019-12-17 | Discharge: 2019-12-17 | Disposition: A | Discharge status: Home / Self Care | Payer: Medicare Other | Source: Ambulatory Visit | Attending: Cardiology | Admitting: Cardiology

## 2019-12-17 DIAGNOSIS — Z955 Presence of coronary angioplasty implant and graft: Secondary | ICD-10-CM

## 2019-12-17 DIAGNOSIS — I214 Non-ST elevation (NSTEMI) myocardial infarction: Secondary | ICD-10-CM

## 2019-12-17 NOTE — Progress Notes (Signed)
Cardiac Individual Treatment Plan  Patient Details  Name: Karen West MRN: [id number] Date of Birth: 1940/07/16 Referring Provider:     CARDIAC REHAB PHASE II ORIENTATION from 10/28/2019 in [facility]  Referring Provider Minus Breeding, MD      Initial Encounter Date:    CARDIAC REHAB PHASE II ORIENTATION from 10/28/2019 in [place]  Date 10/28/19      Visit Diagnosis: NSTEMI (non-ST elevated myocardial infarction) (HCC),07/02/19  S/P DES x 4 07/03/19  Patient's Home Medications on Admission:  Current Outpatient Medications:  .  acetaminophen (TYLENOL) 500 MG tablet, Take 1,000 mg by mouth every 6 (six) hours as needed for moderate pain or headache., Disp: , Rfl:  .  Alirocumab (PRALUENT) 150 MG/ML SOAJ, Inject 1 Dose into the skin every 14 (fourteen) days., Disp: 2 pen, Rfl: 11 .  amLODipine (NORVASC) 10 MG tablet, Take 10 mg by mouth daily., Disp: , Rfl:  .  aspirin 81 MG tablet, Take 81 mg by mouth daily.  , Disp: , Rfl:  .  cholecalciferol (VITAMIN D3) 25 MCG (1000 UNIT) tablet, Take 2,000 Units by mouth daily., Disp: , Rfl:  .  Cobalamin Combinations (B-12) [phone] MCG SUBL, Place 1 tablet under the tongue daily. , Disp: , Rfl:  .  Empagliflozin-linaGLIPtin (GLYXAMBI) 10-5 MG TABS, Take by mouth., Disp: , Rfl:  .  hydrocortisone cream 1 %, Apply 1 application topically 2 (two) times daily as needed for itching. , Disp: , Rfl:  .  levothyroxine (SYNTHROID, LEVOTHROID) 88 MCG tablet, Take 88 mcg by mouth daily., Disp: , Rfl: 5 .  metoprolol tartrate (LOPRESSOR) 50 MG tablet, Take 0.5 tablets (25 mg total) by mouth 2 (two) times daily. MAY TAKE EXTRA 25MG  MAY TAKE 2 ADDITIONAL TIMES DAILY, Disp: 60 tablet, Rfl: 3 .  nitroGLYCERIN (NITROSTAT) 0.4 MG SL tablet, Place 1 tablet (0.4 mg total) under the tongue every 5 (five) minutes as needed for chest pain., Disp: 25 tablet, Rfl: 3 .  Omega-3 Fatty Acids (FISH OIL)  1200 MG CAPS, Take 1,200 mg by mouth 2 (two) times daily. , Disp: , Rfl:  .  omeprazole (PRILOSEC) 20 MG capsule, Take 20 mg by mouth 2 (two) times daily. Takes prn, Disp: , Rfl:  .  ticagrelor (BRILINTA) 90 MG TABS tablet, Take 1 tablet (90 mg total) by mouth 2 (two) times daily., Disp: 180 tablet, Rfl: 3  Past Medical History: Past Medical History:  Diagnosis Date  . Atherosclerosis of aorta ([place])    a. 01/2017/03/2017 - noted on high res chest CTs.  . Breast cancer ([place])    a. Bilateral --> s/p left mastectomy  . Carotid artery disease ([place])    a. 11/3788 w/ 24-09% LICA stenosis and <73% RICA stenosis; b. 10/2015 Carotid U/S: < 50% BICA stenosis  . Chest pain    a. 09/2011 MV: EF 68%, no ischemia/infarct.  . Chronic anemia   . Chronic headaches    denies  . Coronary artery calcification seen on CT scan    a. 01/2017 High res CT: atherosclerotic calcification of the arterial vascularture, including severe involvement of the coronary arteries; b. 03/2017 CT Chest: coronary and Ao atheroscelrosis.  Marland Kitchen GERD (gastroesophageal reflux disease)   . History of echocardiogram    a. 09/2011 Echo: EF 55-60%, no rwma, triv AI, PASP 21mmHg.  Marland Kitchen Hyperlipidemia   . Hypertension   . Hyperthyroidism   . Left upper lobe pulmonary nodule  a. 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 05/2017 s/p VATS.  . Multinodular goiter    a. 02/2017 PET scan- Hypermetabolic nodule;  b. 04/8784 s/p thyroidectomy  . Obesity   . OSA on CPAP    cpap  . Osteoarthritis   . Personal history of radiation therapy 1999  . Right bundle branch block   . Type II or unspecified type diabetes mellitus without mention of complication, uncontrolled     Tobacco Use: Social History   Tobacco Use  Smoking Status Never Smoker  Smokeless Tobacco Never Used    Labs: Recent Review Flowsheet Data    Labs for ITP Cardiac and Pulmonary Rehab  Latest Ref Rng & Units 05/08/2017 05/31/2017 06/05/2017 07/02/2019 07/03/2019   Cholestrol 0 - 200 mg/dL - - - - 121   LDLCALC 0 - 99 mg/dL - - - - 61   HDL >40 mg/dL - - - - 37(L)   Trlycerides <150 mg/dL - - - - 113   Hemoglobin A1c 4.8 - 5.6 % 6.9(H) - - 7.3(H) -   PHART 7.35 - 7.45 - 7.465(H) 7.460(H) - -   PCO2ART 32 - 48 mmHg - 36.8 37.2 - -   HCO3 20.0 - 28.0 mmol/L - 26.1 26.2 - -   O2SAT % - 98.5 95.1 - -      Capillary Blood Glucose: Lab Results  Component Value Date   GLUCAP 218 (H) 11/07/2019   GLUCAP 187 (H) 11/05/2019   GLUCAP 138 (H) 11/03/2019   GLUCAP 162 (H) 11/03/2019   GLUCAP 248 (H) 08/26/2019     Exercise Target Goals: Exercise Program Goal: Individual exercise prescription set using results from initial 6 min walk test and THRR while considering  patient's activity barriers and safety.   Exercise Prescription Goal: Starting with aerobic activity 30 plus minutes a day, 3 days per week for initial exercise prescription. Provide home exercise prescription and guidelines that participant acknowledges understanding prior to discharge.  Activity Barriers & Risk Stratification:  Activity Barriers & Cardiac Risk Stratification - 10/28/19 0916      Activity Barriers & Cardiac Risk Stratification   Activity Barriers Arthritis;Deconditioning   Pt has bilateral knee pain at times   Cardiac Risk Stratification High           6 Minute Walk:  6 Minute Walk    Row Name 10/28/19 0958         6 Minute Walk   Phase Initial     Distance 957 feet     Walk Time 6 minutes     # of Rest Breaks 0     MPH 1.81     METS 2.02     RPE 11     Perceived Dyspnea  0     VO2 Peak 7.07     Symptoms Yes (comment)     Comments Fatigue     Resting HR 67 bpm     Resting BP 110/60     Resting Oxygen Saturation  100 %     Exercise Oxygen Saturation  during 6 min walk 99 %     Max Ex. HR 114 bpm     Max Ex. BP 140/66     2 Minute Post BP 122/64            Oxygen  Initial Assessment:   Oxygen Re-Evaluation:   Oxygen Discharge (Final Oxygen Re-Evaluation):   Initial Exercise Prescription:  Initial Exercise Prescription - 10/28/19 0900      Date of Initial Exercise RX and Referring Provider   Date 10/28/19    Referring Provider Minus Breeding, MD    Expected Discharge Date 12/26/19      NuStep   Level 1    SPM 75    Minutes 30    METs 1.7      Prescription Details   Frequency (times per week) 3    Duration Progress to 30 minutes of continuous aerobic without signs/symptoms of physical distress      Intensity   THRR 40-80% of Max Heartrate 57-114    Ratings of Perceived Exertion 11-13    Perceived Dyspnea 0-4      Progression   Progression Continue progressive overload as per policy without signs/symptoms or physical distress.      Resistance Training   Training Prescription Yes    Weight 2lbs    Reps 10-15           Perform Capillary Blood Glucose checks as needed.  Exercise Prescription Changes:   Exercise Prescription Changes    Row Name 11/03/19 1300 11/26/19 1400 12/08/19 0725 12/17/19 0719       Response to Exercise   Blood Pressure (Admit) 132/56 134/60 118/70 132/72    Blood Pressure (Exercise) 122/68 142/62 142/62 150/80    Blood Pressure (Exit) 100/60 102/60 112/72 112/60    Heart Rate (Admit) 93 bpm 95 bpm 68 bpm 64 bpm    Heart Rate (Exercise) 101 bpm 109 bpm 103 bpm 126 bpm    Heart Rate (Exit) 70 bpm 89 bpm 74 bpm 72 bpm    Rating of Perceived Exertion (Exercise) 12 13 13 13     Perceived Dyspnea (Exercise) 0 0 0 0    Symptoms None None None None    Comments Pt's first day of exercise  Reviewed Home Exercise Plan None None    Duration Progress to 10 minutes continuous walking  at current work load and total walking time to 30-45 min Progress to 30 minutes of  aerobic without signs/symptoms of physical distress Continue with 30 min of aerobic exercise without signs/symptoms of physical distress. Continue  with 30 min of aerobic exercise without signs/symptoms of physical distress.    Intensity THRR unchanged THRR unchanged THRR unchanged THRR unchanged      Progression   Progression Continue to progress workloads to maintain intensity without signs/symptoms of physical distress. Continue to progress workloads to maintain intensity without signs/symptoms of physical distress. Continue to progress workloads to maintain intensity without signs/symptoms of physical distress. Continue to progress workloads to maintain intensity without signs/symptoms of physical distress.    Average METs 2.2 3.3 2.8 2.9      Resistance Training   Training Prescription Yes No Yes No    Weight 2lbs -- 2lbs --    Reps 10-15 -- 10-15 --    Time 15 Minutes -- 10 Minutes --      Interval Training   Interval Training No No No No      NuStep   Level 1 2 2 2     SPM 85 95 95 95    Minutes 30 30 30 30     METs 2.2 3.3 2.8 2.9      Home Exercise Plan   Plans to continue exercise at -- Home (comment)  Walking Home (comment)  Walking Home (comment)  Walking    Frequency -- Add 3 additional days to program exercise sessions. Add  3 additional days to program exercise sessions. Add 3 additional days to program exercise sessions.    Initial Home Exercises Provided -- 11/26/19 11/26/19 11/26/19           Exercise Comments:   Exercise Comments    Row Name 11/03/19 1311 11/26/19 1448 12/18/19 0719       Exercise Comments Pt's first day of exercise. Pt responded well to exercise prescription. Will continue to monitor pt. Reviewed Home Exercise Plan. Pt is currently walking at home daily 5-15 minutes. Pt states she is feeling stronger and is able to walk for longer periods of time with no rest breaks. Will continue to monitor and progress pt as tolerated. Pt is doing very well with exercise prescription. Pt is able to walk for longer distances and puts forth great effort with exercise. Pt is set to finish cardiac rehab next  week, will follow up with pt regarding plans for exercise.            Exercise Goals and Review:   Exercise Goals    Row Name 10/28/19 3818             Exercise Goals   Increase Physical Activity Yes       Intervention Provide advice, education, support and counseling about physical activity/exercise needs.;Develop an individualized exercise prescription for aerobic and resistive training based on initial evaluation findings, risk stratification, comorbidities and participant's personal goals.       Expected Outcomes Short Term: Attend rehab on a regular basis to increase amount of physical activity.;Long Term: Add in home exercise to make exercise part of routine and to increase amount of physical activity.;Long Term: Exercising regularly at least 3-5 days a week.       Increase Strength and Stamina Yes       Intervention Provide advice, education, support and counseling about physical activity/exercise needs.;Develop an individualized exercise prescription for aerobic and resistive training based on initial evaluation findings, risk stratification, comorbidities and participant's personal goals.       Expected Outcomes Short Term: Increase workloads from initial exercise prescription for resistance, speed, and METs.;Short Term: Perform resistance training exercises routinely during rehab and add in resistance training at home;Long Term: Improve cardiorespiratory fitness, muscular endurance and strength as measured by increased METs and functional capacity (6MWT)       Able to understand and use rate of perceived exertion (RPE) scale Yes       Intervention Provide education and explanation on how to use RPE scale       Expected Outcomes Long Term:  Able to use RPE to guide intensity level when exercising independently;Short Term: Able to use RPE daily in rehab to express subjective intensity level       Knowledge and understanding of Target Heart Rate Range (THRR) Yes       Intervention  Provide education and explanation of THRR including how the numbers were predicted and where they are located for reference       Expected Outcomes Short Term: Able to state/look up THRR;Long Term: Able to use THRR to govern intensity when exercising independently;Short Term: Able to use daily as guideline for intensity in rehab       Understanding of Exercise Prescription Yes       Intervention Provide education, explanation, and written materials on patient's individual exercise prescription       Expected Outcomes Short Term: Able to explain program exercise prescription;Long Term: Able to explain home exercise prescription to exercise  independently              Exercise Goals Re-Evaluation :  Exercise Goals Re-Evaluation    [place] Name 11/03/19 1311 11/26/19 1449 12/18/19 0722         Exercise Goal Re-Evaluation   Exercise Goals Review Increase Physical Activity;Able to understand and use rate of perceived exertion (RPE) scale;Understanding of Exercise Prescription Increase Physical Activity;Increase Strength and Stamina;Able to understand and use rate of perceived exertion (RPE) scale;Knowledge and understanding of Target Heart Rate Range (THRR);Able to check pulse independently;Understanding of Exercise Prescription Increase Physical Activity;Increase Strength and Stamina;Able to understand and use rate of perceived exertion (RPE) scale;Knowledge and understanding of Target Heart Rate Range (THRR);Able to check pulse independently;Understanding of Exercise Prescription     Comments Pt's first day of exercise. Pt oriented to exericse equipment. Will continue to monitor and progress pt. Reviewed Home Exercise Plan. Also discussed THRR, RPE Scale, weather conditions, NTG use, endpoints of exercise, warmup and cool down. Pt is responding well to exercise prescription. Will work with pt to set up plan for exercise once she is finished with cardiac rehab.     Expected Outcomes Pt will work to  increase her strength and stamina. Pt will continue to exercise daily. Pt will walk for 10 minute increments 2x a day. Will continue to increase cardiovascular strength. Pt is continuing to walk daily. Pt will continue to increase strength and stamina.             Discharge Exercise Prescription (Final Exercise Prescription Changes):  Exercise Prescription Changes - 12/17/19 0719      Response to Exercise   Blood Pressure (Admit) 132/72    Blood Pressure (Exercise) 150/80    Blood Pressure (Exit) 112/60    Heart Rate (Admit) 64 bpm    Heart Rate (Exercise) 126 bpm    Heart Rate (Exit) 72 bpm    Rating of Perceived Exertion (Exercise) 13    Perceived Dyspnea (Exercise) 0    Symptoms None    Comments None    Duration Continue with 30 min of aerobic exercise without signs/symptoms of physical distress.    Intensity THRR unchanged      Progression   Progression Continue to progress workloads to maintain intensity without signs/symptoms of physical distress.    Average METs 2.9      Resistance Training   Training Prescription No      Interval Training   Interval Training No      NuStep   Level 2    SPM 95    Minutes 30    METs 2.9      Home Exercise Plan   Plans to continue exercise at Home (comment)   Walking   Frequency Add 3 additional days to program exercise sessions.    Initial Home Exercises Provided 11/26/19           Nutrition:  Target Goals: Understanding of nutrition guidelines, daily intake of sodium 1500mg , cholesterol 200mg , calories 30% from fat and 7% or less from saturated fats, daily to have 5 or more servings of fruits and vegetables.  Biometrics:  Pre Biometrics - 10/28/19 0800      Pre Biometrics   Waist Circumference 38 inches    Hip Circumference 43.5 inches    Waist to Hip Ratio 0.87 %    Triceps Skinfold 25 mm    % Body Fat 40.5 %    Grip Strength 21.5 kg    Flexibility 18 in  Single Leg Stand 10.2 seconds             Nutrition Therapy Plan and Nutrition Goals:  Nutrition Therapy & Goals - 11/25/19 1001      Nutrition Therapy   Diet Heart Healthy      Personal Nutrition Goals   Nutrition Goal Pt to build a healthy plate including vegetables, fruits, whole grains, and low-fat dairy products in a heart healthy meal plan.    Personal Goal #2 Improved blood glucose control as evidenced by pt's A1c trending from 8.0 toward less than 7.0.    Personal Goal #3 Pt to increase fiber intake to meet daily recommendations to lower LDL cholesterol      Intervention Plan   Intervention Prescribe, educate and counsel regarding individualized specific dietary modifications aiming towards targeted core components such as weight, hypertension, lipid management, diabetes, heart failure and other comorbidities.;Nutrition handout(s) given to patient.    Expected Outcomes Short Term Goal: A plan has been developed with personal nutrition goals set during dietitian appointment.;Long Term Goal: Adherence to prescribed nutrition plan.           Nutrition Assessments:  Nutrition Assessments - 11/20/19 1107      MEDFICTS Scores   Pre Score 36           Nutrition Goals Re-Evaluation:  Nutrition Goals Re-Evaluation    Row Name 11/25/19 1001 12/16/19 0856           Goals   Current Weight 146 lb 2.6 oz (66.3 kg) 146 lb 13.2 oz (66.6 kg)      Nutrition Goal -- Pt to build a healthy plate including vegetables, fruits, whole grains, and low-fat dairy products in a heart healthy meal plan.      Comment -- Pt maintaining a heart healthy diet, we reviewed balanced eating        Personal Goal #2 Re-Evaluation   Personal Goal #2 -- Improved blood glucose control as evidenced by pt's A1c trending from 8.0 toward less than 7.0.        Personal Goal #3 Re-Evaluation   Personal Goal #3 -- Pt to increase fiber intake to meet daily recommendations to lower LDL cholesterol             Nutrition Goals Discharge (Final  Nutrition Goals Re-Evaluation):  Nutrition Goals Re-Evaluation - 12/16/19 0856      Goals   Current Weight 146 lb 13.2 oz (66.6 kg)    Nutrition Goal Pt to build a healthy plate including vegetables, fruits, whole grains, and low-fat dairy products in a heart healthy meal plan.    Comment Pt maintaining a heart healthy diet, we reviewed balanced eating      Personal Goal #2 Re-Evaluation   Personal Goal #2 Improved blood glucose control as evidenced by pt's A1c trending from 8.0 toward less than 7.0.      Personal Goal #3 Re-Evaluation   Personal Goal #3 Pt to increase fiber intake to meet daily recommendations to lower LDL cholesterol           Psychosocial: Target Goals: Acknowledge presence or absence of significant depression and/or stress, maximize coping skills, provide positive support system. Participant is able to verbalize types and ability to use techniques and skills needed for reducing stress and depression.  Initial Review & Psychosocial Screening:  Initial Psych Review & Screening - 10/28/19 1235      Initial Review   Current issues with None Identified      Family  Dynamics   Good Support System? Yes   Jolane lives alone. Jaionna has her children for support who lives in [place]     Barriers   Psychosocial barriers to participate in program There are no identifiable barriers or psychosocial needs.      Screening Interventions   Interventions Encouraged to exercise           Quality of Life Scores:  Quality of Life - 10/28/19 0932      Quality of Life   Select Quality of Life      Quality of Life Scores   Health/Function Pre 25.5 %    Socioeconomic Pre 29.14 %    Psych/Spiritual Pre 29.14 %    Family Pre 27 %    GLOBAL Pre 27.23 %          Scores of 19 and below usually indicate a poorer quality of life in these areas.  A difference of  2-3 points is a clinically meaningful difference.  A difference of 2-3 points in the total score of the Quality  of Life Index has been associated with significant improvement in overall quality of life, self-image, physical symptoms, and general health in studies assessing change in quality of life.  PHQ-9: Recent Review Flowsheet Data    Depression screen [facility] 2/9 10/28/2019 09/12/2019 01/15/2018 10/11/2017 09/26/2017   Decreased Interest 0 0 0 0 0   Down, Depressed, Hopeless 0 0 0 0 0   PHQ - 2 Score 0 0 0 0 0   Altered sleeping - - 1  0 0   Tired, decreased energy - - 1 1 1    Change in appetite - - 2 2 3    Feeling bad or failure about yourself  - - 0 0 0   Trouble concentrating - - 0 0 0   Moving slowly or fidgety/restless - - 0 1 0   Suicidal thoughts - - 0 0 0   PHQ-9 Score - - 4 4 4    Difficult doing work/chores - - Not difficult at all Not difficult at all Not difficult at all     Interpretation of Total Score  Total Score Depression Severity:  1-4 = Minimal depression, 5-9 = Mild depression, 10-14 = Moderate depression, 15-19 = Moderately severe depression, 20-27 = Severe depression   Psychosocial Evaluation and Intervention:   Psychosocial Re-Evaluation:  Psychosocial Re-Evaluation    Row Name 11/27/19 1624 12/17/19 1202           Psychosocial Re-Evaluation   Current issues with None Identified None Identified      Interventions Encouraged to attend Cardiac Rehabilitation for the exercise Encouraged to attend Cardiac Rehabilitation for the exercise      Continue Psychosocial Services  No Follow up required No Follow up required             Psychosocial Discharge (Final Psychosocial Re-Evaluation):  Psychosocial Re-Evaluation - 12/17/19 1202      Psychosocial Re-Evaluation   Current issues with None Identified    Interventions Encouraged to attend Cardiac Rehabilitation for the exercise    Continue Psychosocial Services  No Follow up required           Vocational Rehabilitation: Provide vocational rehab assistance to qualifying candidates.   Vocational Rehab  Evaluation & Intervention:  Vocational Rehab - 10/28/19 1237      Initial Vocational Rehab Evaluation & Intervention   Assessment shows need for Vocational Rehabilitation No   Maronda is retired and does not  need vocational rehab at this time          Education: Education Goals: Education classes will be provided on a weekly basis, covering required topics. Participant will state understanding/return demonstration of topics presented.  Learning Barriers/Preferences:  Learning Barriers/Preferences - 10/28/19 0933      Learning Barriers/Preferences   Learning Barriers Sight   Wears glasses   Learning Preferences Group Instruction;Individual Instruction;Skilled Demonstration           Education Topics: Hypertension, Hypertension Reduction -Define heart disease and high blood pressure. Discus how high blood pressure affects the body and ways to reduce high blood pressure.   Exercise and Your Heart -Discuss why it is important to exercise, the FITT principles of exercise, normal and abnormal responses to exercise, and how to exercise safely.   Angina -Discuss definition of angina, causes of angina, treatment of angina, and how to decrease risk of having angina.   Cardiac Medications -Review what the following cardiac medications are used for, how they affect the body, and side effects that may occur when taking the medications.  Medications include Aspirin, Beta blockers, calcium channel blockers, ACE Inhibitors, angiotensin receptor blockers, diuretics, digoxin, and antihyperlipidemics.   Congestive Heart Failure -Discuss the definition of CHF, how to live with CHF, the signs and symptoms of CHF, and how keep track of weight and sodium intake.   Heart Disease and Intimacy -Discus the effect sexual activity has on the heart, how changes occur during intimacy as we age, and safety during sexual activity.   Smoking Cessation / COPD -Discuss different methods to quit smoking,  the health benefits of quitting smoking, and the definition of COPD.   Nutrition I: Fats -Discuss the types of cholesterol, what cholesterol does to the heart, and how cholesterol levels can be controlled.   Nutrition II: Labels -Discuss the different components of food labels and how to read food label   Heart Parts/Heart Disease and PAD -Discuss the anatomy of the heart, the pathway of blood circulation through the heart, and these are affected by heart disease.   Stress I: Signs and Symptoms -Discuss the causes of stress, how stress may lead to anxiety and depression, and ways to limit stress.   Stress II: Relaxation -Discuss different types of relaxation techniques to limit stress.   Warning Signs of Stroke / TIA -Discuss definition of a stroke, what the signs and symptoms are of a stroke, and how to identify when someone is having stroke.   Knowledge Questionnaire Score:  Knowledge Questionnaire Score - 10/28/19 0935      Knowledge Questionnaire Score   Pre Score 20/24           Core Components/Risk Factors/Patient Goals at Admission:  Personal Goals and Risk Factors at Admission - 10/28/19 1237      Core Components/Risk Factors/Patient Goals on Admission    Weight Management Weight Maintenance    Intervention Weight Management: Develop a combined nutrition and exercise program designed to reach desired caloric intake, while maintaining appropriate intake of nutrient and fiber, sodium and fats, and appropriate energy expenditure required for the weight goal.;Weight Management: Provide education and appropriate resources to help participant work on and attain dietary goals.;Weight Management/Obesity: Establish reasonable short term and long term weight goals.    Admit Weight 144 lb 13.5 oz (65.7 kg)    Expected Outcomes Short Term: Continue to assess and modify interventions until short term weight is achieved;Long Term: Adherence to nutrition and physical  activity/exercise program aimed toward  attainment of established weight goal;Weight Maintenance: Understanding of the daily nutrition guidelines, which includes 25-35% calories from fat, 7% or less cal from saturated fats, less than 200mg  cholesterol, less than 1.5gm of sodium, & 5 or more servings of fruits and vegetables daily;Weight Loss: Understanding of general recommendations for a balanced deficit meal plan, which promotes 1-2 lb weight loss per week and includes a negative energy balance of [phone] kcal/d;Understanding recommendations for meals to include 15-35% energy as protein, 25-35% energy from fat, 35-60% energy from carbohydrates, less than 200mg  of dietary cholesterol, 20-35 gm of total fiber daily;Understanding of distribution of calorie intake throughout the day with the consumption of 4-5 meals/snacks    Diabetes Yes    Intervention Provide education about signs/symptoms and action to take for hypo/hyperglycemia.;Provide education about proper nutrition, including hydration, and aerobic/resistive exercise prescription along with prescribed medications to achieve blood glucose in normal ranges: Fasting glucose 65-99 mg/dL    Hypertension Yes    Intervention Provide education on lifestyle modifcations including regular physical activity/exercise, weight management, moderate sodium restriction and increased consumption of fresh fruit, vegetables, and low fat dairy, alcohol moderation, and smoking cessation.;Monitor prescription use compliance.    Lipids Yes    Intervention Provide education and support for participant on nutrition & aerobic/resistive exercise along with prescribed medications to achieve LDL 70mg , HDL >40mg .    Expected Outcomes Short Term: Participant states understanding of desired cholesterol values and is compliant with medications prescribed. Participant is following exercise prescription and nutrition guidelines.;Long Term: Cholesterol controlled with medications as  prescribed, with individualized exercise RX and with personalized nutrition plan. Value goals: LDL < 70mg , HDL > 40 mg.           Core Components/Risk Factors/Patient Goals Review:   Goals and Risk Factor Review    Row Name 11/27/19 1625 12/17/19 1202           Core Components/Risk Factors/Patient Goals Review   Personal Goals Review Weight Management/Obesity;Hypertension;Diabetes;Lipids Weight Management/Obesity;Hypertension;Diabetes;Lipids      Review Zakirah is doing well with exercise at cardiac rehab. Shaunette's vital signs and CBG's have been stable. Braniya is doing well with exercise at cardiac rehab. Shirle's vital signs and CBG's have been stable. Anniue has great effort. Clarece will complete cardiac rehab next week      Expected Outcomes Kadian wil continue to participate in phase 2 cardiac rehab for exercise, nutrition and lifestyle modifications Corleen wil continue to participate in phase 2 cardiac rehab for exercise, nutrition and lifestyle modifications             Core Components/Risk Factors/Patient Goals at Discharge (Final Review):   Goals and Risk Factor Review - 12/17/19 1202      Core Components/Risk Factors/Patient Goals Review   Personal Goals Review Weight Management/Obesity;Hypertension;Diabetes;Lipids    Review Deneka is doing well with exercise at cardiac rehab. Yahaira's vital signs and CBG's have been stable. Anniue has great effort. Roya will complete cardiac rehab next week    Expected Outcomes Patrica wil continue to participate in phase 2 cardiac rehab for exercise, nutrition and lifestyle modifications           ITP Comments:  ITP Comments    Row Name 10/28/19 1227 11/27/19 1623 12/17/19 1201       ITP Comments Dr Fransico Him MD, Medical Director 30 Day ITP Review. Baylee is with good participation and attendance in phase 2 cardiac rehab 30 Day ITP Review. Adlean is with good participation and attendance in phase 2  cardiac rehab. Mira will complete cardiac  rehab next week            Comments: See ITP comments.Barnet Pall, RN,BSN 12/18/2019 10:54 AM

## 2019-12-19 ENCOUNTER — Other Ambulatory Visit: Payer: Self-pay

## 2019-12-19 ENCOUNTER — Encounter (HOSPITAL_COMMUNITY)
Admission: RE | Admit: 2019-12-19 | Discharge: 2019-12-19 | Disposition: A | Discharge status: Home / Self Care | Payer: Medicare Other | Source: Ambulatory Visit | Attending: Cardiology | Admitting: Cardiology

## 2019-12-19 VITALS — Ht 61.0 in | Wt 147.3 lb

## 2019-12-19 DIAGNOSIS — I214 Non-ST elevation (NSTEMI) myocardial infarction: Secondary | ICD-10-CM | POA: Diagnosis not present

## 2019-12-19 DIAGNOSIS — Z955 Presence of coronary angioplasty implant and graft: Secondary | ICD-10-CM

## 2019-12-22 ENCOUNTER — Encounter (HOSPITAL_COMMUNITY)
Admission: RE | Admit: 2019-12-22 | Discharge: 2019-12-22 | Disposition: A | Discharge status: Home / Self Care | Payer: Medicare Other | Source: Ambulatory Visit | Attending: Cardiology | Admitting: Cardiology

## 2019-12-22 ENCOUNTER — Other Ambulatory Visit: Payer: Self-pay

## 2019-12-22 DIAGNOSIS — Z955 Presence of coronary angioplasty implant and graft: Secondary | ICD-10-CM

## 2019-12-22 DIAGNOSIS — I214 Non-ST elevation (NSTEMI) myocardial infarction: Secondary | ICD-10-CM

## 2019-12-24 ENCOUNTER — Other Ambulatory Visit: Payer: Self-pay | Admitting: Gastroenterology

## 2019-12-24 ENCOUNTER — Encounter (HOSPITAL_COMMUNITY)
Admission: RE | Admit: 2019-12-24 | Discharge: 2019-12-24 | Disposition: A | Discharge status: Home / Self Care | Payer: Medicare Other | Source: Ambulatory Visit | Attending: Cardiology | Admitting: Cardiology

## 2019-12-24 ENCOUNTER — Other Ambulatory Visit: Payer: Self-pay

## 2019-12-24 DIAGNOSIS — K862 Cyst of pancreas: Secondary | ICD-10-CM | POA: Diagnosis not present

## 2019-12-24 DIAGNOSIS — R933 Abnormal findings on diagnostic imaging of other parts of digestive tract: Secondary | ICD-10-CM | POA: Diagnosis not present

## 2019-12-24 DIAGNOSIS — I214 Non-ST elevation (NSTEMI) myocardial infarction: Secondary | ICD-10-CM | POA: Diagnosis not present

## 2019-12-24 DIAGNOSIS — Z955 Presence of coronary angioplasty implant and graft: Secondary | ICD-10-CM | POA: Diagnosis not present

## 2019-12-24 DIAGNOSIS — N281 Cyst of kidney, acquired: Secondary | ICD-10-CM | POA: Diagnosis not present

## 2019-12-26 ENCOUNTER — Encounter (HOSPITAL_COMMUNITY)
Admission: RE | Admit: 2019-12-26 | Discharge: 2019-12-26 | Disposition: A | Discharge status: Home / Self Care | Payer: Medicare Other | Source: Ambulatory Visit | Attending: Cardiology | Admitting: Cardiology

## 2019-12-26 ENCOUNTER — Other Ambulatory Visit: Payer: Self-pay

## 2019-12-26 DIAGNOSIS — Z23 Encounter for immunization: Secondary | ICD-10-CM | POA: Diagnosis not present

## 2019-12-26 DIAGNOSIS — Z955 Presence of coronary angioplasty implant and graft: Secondary | ICD-10-CM

## 2019-12-26 DIAGNOSIS — I214 Non-ST elevation (NSTEMI) myocardial infarction: Secondary | ICD-10-CM | POA: Diagnosis not present

## 2019-12-26 NOTE — Progress Notes (Signed)
Discharge Progress Report  Patient Details  Name: Karen West MRN: [id number] Date of Birth: [birth year] Referring Provider:     CARDIAC REHAB PHASE II ORIENTATION from 10/28/2019 in [facility]  Referring Provider Minus Breeding, MD       Number of Visits: 23  Reason for Discharge:  Patient reached a stable level of exercise. Patient has met program and personal goals.  Smoking History:  Social History   Tobacco Use  Smoking Status Never Smoker  Smokeless Tobacco Never Used    Diagnosis:  NSTEMI (non-ST elevated myocardial infarction) (HCC),07/02/19  S/P DES x 4 07/03/19  ADL UCSD:   Initial Exercise Prescription:  Initial Exercise Prescription - 10/28/19 0900      Date of Initial Exercise RX and Referring Provider   Date 10/28/19    Referring Provider Minus Breeding, MD    Expected Discharge Date 12/26/19      NuStep   Level 1    SPM 75    Minutes 30    METs 1.7      Prescription Details   Frequency (times per week) 3    Duration Progress to 30 minutes of continuous aerobic without signs/symptoms of physical distress      Intensity   THRR 40-80% of Max Heartrate 57-114    Ratings of Perceived Exertion 11-13    Perceived Dyspnea 0-4      Progression   Progression Continue progressive overload as per policy without signs/symptoms or physical distress.      Resistance Training   Training Prescription Yes    Weight 2lbs    Reps 10-15           Discharge Exercise Prescription (Final Exercise Prescription Changes):  Exercise Prescription Changes - 12/26/19 1129      Response to Exercise   Blood Pressure (Admit) 124/64    Blood Pressure (Exercise) 140/62    Blood Pressure (Exit) 122/68    Heart Rate (Admit) 83 bpm    Heart Rate (Exercise) 109 bpm    Heart Rate (Exit) 82 bpm    Rating of Perceived Exertion (Exercise) 13    Perceived Dyspnea (Exercise) 0    Symptoms None    Comments Pt's last day of exercise      Duration Continue with 30 min of aerobic exercise without signs/symptoms of physical distress.    Intensity THRR unchanged      Progression   Progression Continue to progress workloads to maintain intensity without signs/symptoms of physical distress.    Average METs 2.8      Resistance Training   Training Prescription Yes    Weight 2lbs    Reps 10-15    Time 10 Minutes      Interval Training   Interval Training No      NuStep   Level 2    SPM 105    Minutes 30    METs 2.8      Home Exercise Plan   Plans to continue exercise at Home (comment)   Walking   Frequency Add 3 additional days to program exercise sessions.    Initial Home Exercises Provided 11/26/19           Functional Capacity:  6 Minute Walk    Row Name 10/28/19 0958 12/22/19 0759       6 Minute Walk   Phase Initial Discharge    Distance 957 feet 1070 feet    Distance % Change -- 11.81 %  Distance Feet Change -- 113 ft    Walk Time 6 minutes 6 minutes    # of Rest Breaks 0 0    MPH 1.81 2    METS 2.02 2.2    RPE 11 12    Perceived Dyspnea  0 0    VO2 Peak 7.07 7.8    Symptoms Yes (comment) Yes (comment)    Comments Fatigue SOB +1    Resting HR 67 bpm 64 bpm    Resting BP 110/60 132/72    Resting Oxygen Saturation  100 % --    Exercise Oxygen Saturation  during 6 min walk 99 % --    Max Ex. HR 114 bpm 109 bpm    Max Ex. BP 140/66 150/80    2 Minute Post BP 122/64 112/60           Psychological, QOL, Others - Outcomes: PHQ 2/9: Depression screen [facility] 2/9 01/13/2020 10/28/2019 09/12/2019 01/15/2018 10/11/2017  Decreased Interest 0 0 0 0 0  Down, Depressed, Hopeless 0 0 0 0 0  PHQ - 2 Score 0 0 0 0 0  Altered sleeping - - - 1 0  Tired, decreased energy - - - 1 1  Change in appetite - - - 2 2  Feeling bad or failure about yourself  - - - 0 0  Trouble concentrating - - - 0 0  Moving slowly or fidgety/restless - - - 0 1  Suicidal thoughts - - - 0 0  PHQ-9 Score - - - 4 4  Difficult  doing work/chores - - - Not difficult at all Not difficult at all  Some recent data might be hidden    Quality of Life:  Quality of Life - 12/25/19 1538      Quality of Life Scores   Health/Function Pre 25.5 %    Health/Function Post 25.77 %    Health/Function % Change 1.06 %    Socioeconomic Pre 29.14 %    Socioeconomic Post 27 %    Socioeconomic % Change  -7.34 %    Psych/Spiritual Pre 29.14 %    Psych/Spiritual Post 30 %    Psych/Spiritual % Change 2.95 %    Family Pre 27 %    Family Post 23.8 %    Family % Change -11.85 %    GLOBAL Pre 27.23 %    GLOBAL Post 26.6 %    GLOBAL % Change -2.31 %           Personal Goals: Goals established at orientation with interventions provided to work toward goal.  Personal Goals and Risk Factors at Admission - 10/28/19 1237      Core Components/Risk Factors/Patient Goals on Admission    Weight Management Weight Maintenance    Intervention Weight Management: Develop a combined nutrition and exercise program designed to reach desired caloric intake, while maintaining appropriate intake of nutrient and fiber, sodium and fats, and appropriate energy expenditure required for the weight goal.;Weight Management: Provide education and appropriate resources to help participant work on and attain dietary goals.;Weight Management/Obesity: Establish reasonable short term and long term weight goals.    Admit Weight 144 lb 13.5 oz (65.7 kg)    Expected Outcomes Short Term: Continue to assess and modify interventions until short term weight is achieved;Long Term: Adherence to nutrition and physical activity/exercise program aimed toward attainment of established weight goal;Weight Maintenance: Understanding of the daily nutrition guidelines, which includes 25-35% calories from fat, 7% or less cal from saturated  fats, less than $RemoveB'200mg'aqWCtwyC$  cholesterol, less than 1.5gm of sodium, & 5 or more servings of fruits and vegetables daily;Weight Loss: Understanding of  general recommendations for a balanced deficit meal plan, which promotes 1-2 lb weight loss per week and includes a negative energy balance of [phone] kcal/d;Understanding recommendations for meals to include 15-35% energy as protein, 25-35% energy from fat, 35-60% energy from carbohydrates, less than $RemoveB'200mg'hdcESJBx$  of dietary cholesterol, 20-35 gm of total fiber daily;Understanding of distribution of calorie intake throughout the day with the consumption of 4-5 meals/snacks    Diabetes Yes    Intervention Provide education about signs/symptoms and action to take for hypo/hyperglycemia.;Provide education about proper nutrition, including hydration, and aerobic/resistive exercise prescription along with prescribed medications to achieve blood glucose in normal ranges: Fasting glucose 65-99 mg/dL    Hypertension Yes    Intervention Provide education on lifestyle modifcations including regular physical activity/exercise, weight management, moderate sodium restriction and increased consumption of fresh fruit, vegetables, and low fat dairy, alcohol moderation, and smoking cessation.;Monitor prescription use compliance.    Lipids Yes    Intervention Provide education and support for participant on nutrition & aerobic/resistive exercise along with prescribed medications to achieve LDL '70mg'$ , HDL >$Remo'40mg'thQHk$ .    Expected Outcomes Short Term: Participant states understanding of desired cholesterol values and is compliant with medications prescribed. Participant is following exercise prescription and nutrition guidelines.;Long Term: Cholesterol controlled with medications as prescribed, with individualized exercise RX and with personalized nutrition plan. Value goals: LDL < $Rem'70mg'JdMm$ , HDL > 40 mg.            Personal Goals Discharge:  Goals and Risk Factor Review    Row Name 11/27/19 1625 12/17/19 1202 01/13/20 0911         Core Components/Risk Factors/Patient Goals Review   Personal Goals Review Weight  Management/Obesity;Hypertension;Diabetes;Lipids Weight Management/Obesity;Hypertension;Diabetes;Lipids Weight Management/Obesity;Hypertension;Diabetes;Lipids     Review Karen West is doing well with exercise at cardiac rehab. Karen West's vital signs and CBG's have been stable. Karen West is doing well with exercise at cardiac rehab. Karen West's vital signs and CBG's have been stable. Karen West has great effort. Karen West will complete cardiac rehab next week Karen West completed cardiac rehab on 12/26/19. Patient plans to continue exercise by walking     Expected Outcomes Karen West wil continue to participate in phase 2 cardiac rehab for exercise, nutrition and lifestyle modifications Karen West wil continue to participate in phase 2 cardiac rehab for exercise, nutrition and lifestyle modifications Karen West will continue to exercise follow nutrition and lifestyle modifications upon completion of phase 2 cardiac rehab            Exercise Goals and Review:  Exercise Goals    Row Name 10/28/19 1610             Exercise Goals   Increase Physical Activity Yes       Intervention Provide advice, education, support and counseling about physical activity/exercise needs.;Develop an individualized exercise prescription for aerobic and resistive training based on initial evaluation findings, risk stratification, comorbidities and participant's personal goals.       Expected Outcomes Short Term: Attend rehab on a regular basis to increase amount of physical activity.;Long Term: Add in home exercise to make exercise part of routine and to increase amount of physical activity.;Long Term: Exercising regularly at least 3-5 days a week.       Increase Strength and Stamina Yes       Intervention Provide advice, education, support and counseling about physical activity/exercise needs.;Develop an individualized exercise prescription for  aerobic and resistive training based on initial evaluation findings, risk stratification, comorbidities and participant's  personal goals.       Expected Outcomes Short Term: Increase workloads from initial exercise prescription for resistance, speed, and METs.;Short Term: Perform resistance training exercises routinely during rehab and add in resistance training at home;Long Term: Improve cardiorespiratory fitness, muscular endurance and strength as measured by increased METs and functional capacity ( )       Able to understand and use rate of perceived exertion (RPE) scale Yes       Intervention Provide education and explanation on how to use RPE scale       Expected Outcomes Long Term:  Able to use RPE to guide intensity level when exercising independently;Short Term: Able to use RPE daily in rehab to express subjective intensity level       Knowledge and understanding of Target Heart Rate Range (THRR) Yes       Intervention Provide education and explanation of THRR including how the numbers were predicted and where they are located for reference       Expected Outcomes Short Term: Able to state/look up THRR;Long Term: Able to use THRR to govern intensity when exercising independently;Short Term: Able to use daily as guideline for intensity in rehab       Understanding of Exercise Prescription Yes       Intervention Provide education, explanation, and written materials on patient's individual exercise prescription       Expected Outcomes Short Term: Able to explain program exercise prescription;Long Term: Able to explain home exercise prescription to exercise independently              Exercise Goals Re-Evaluation:  Exercise Goals Re-Evaluation    Row Name 11/03/19 1311 11/26/19 1449 12/18/19 0722 01/08/20 1132       Exercise Goal Re-Evaluation   Exercise Goals Review Increase Physical Activity;Able to understand and use rate of perceived exertion (RPE) scale;Understanding of Exercise Prescription Increase Physical Activity;Increase Strength and Stamina;Able to understand and use rate of perceived exertion  (RPE) scale;Knowledge and understanding of Target Heart Rate Range (THRR);Able to check pulse independently;Understanding of Exercise Prescription Increase Physical Activity;Increase Strength and Stamina;Able to understand and use rate of perceived exertion (RPE) scale;Knowledge and understanding of Target Heart Rate Range (THRR);Able to check pulse independently;Understanding of Exercise Prescription Increase Physical Activity;Increase Strength and Stamina;Able to understand and use rate of perceived exertion (RPE) scale;Knowledge and understanding of Target Heart Rate Range (THRR);Able to check pulse independently;Understanding of Exercise Prescription    Comments Pt's first day of exercise. Pt oriented to exericse equipment. Will continue to monitor and progress pt. Reviewed Home Exercise Plan. Also discussed THRR, RPE Scale, weather conditions, NTG use, endpoints of exercise, warmup and cool down. Pt is responding well to exercise prescription. Will work with pt to set up plan for exercise once she is finished with cardiac rehab. Pt completed 23 sessions of Cardiac Rehab. Pt increased her functional capacity by 11.81%, and her post distance by 135ft. Pt states she is feeling stronger since starting cardiac rehab. She is motivated to continue to exercise on her own post rehab.    Expected Outcomes Pt will work to increase her strength and stamina. Pt will continue to exercise daily. Pt will walk for 10 minute increments 2x a day. Will continue to increase cardiovascular strength. Pt is continuing to walk daily. Pt will continue to increase strength and stamina. Pt will continue to walk for exercise 4-5 days a week  for 30 minutes.           Nutrition & Weight - Outcomes:  Pre Biometrics - 10/28/19 0800      Pre Biometrics   Waist Circumference 38 inches    Hip Circumference 43.5 inches    Waist to Hip Ratio 0.87 %    Triceps Skinfold 25 mm    % Body Fat 40.5 %    Grip Strength 21.5 kg     Flexibility 18 in    Single Leg Stand 10.2 seconds           Post Biometrics - 12/22/19 0800       Post  Biometrics   Height _0  (1.549 m)    Weight 66.8 kg    Waist Circumference 39 inches    Hip Circumference 44.5 inches    Waist to Hip Ratio 0.88 %    BMI (Calculated) 27.84    Triceps Skinfold 24 mm    % Body Fat 40.9 %    Grip Strength 24 kg    Flexibility 18.5 in    Single Leg Stand 9.58 seconds           Nutrition:  Nutrition Therapy & Goals - 11/25/19 1001      Nutrition Therapy   Diet Heart Healthy      Personal Nutrition Goals   Nutrition Goal Pt to build a healthy plate including vegetables, fruits, whole grains, and low-fat dairy products in a heart healthy meal plan.    Personal Goal #2 Improved blood glucose control as evidenced by pt's A1c trending from 8.0 toward less than 7.0.    Personal Goal #3 Pt to increase fiber intake to meet daily recommendations to lower LDL cholesterol      Intervention Plan   Intervention Prescribe, educate and counsel regarding individualized specific dietary modifications aiming towards targeted core components such as weight, hypertension, lipid management, diabetes, heart failure and other comorbidities.;Nutrition handout(s) given to patient.    Expected Outcomes Short Term Goal: A plan has been developed with personal nutrition goals set during dietitian appointment.;Long Term Goal: Adherence to prescribed nutrition plan.           Nutrition Discharge:  Nutrition Assessments - 01/12/20 1340      MEDFICTS Scores   Post Score 9           Education Questionnaire Score:  Knowledge Questionnaire Score - 12/25/19 1538      Knowledge Questionnaire Score   Pre Score 20/24    Post Score 22/24           Goals reviewed with patient; copy given to patient.Karen West graduated from cardiac rehab program today with completion of 23 exercise sessions in Phase II. Pt maintained good attendance and progressed nicely during  his participation in rehab as evidenced by increased MET level.   Medication list reconciled. Repeat  PHQ score-0  .  Pt has made significant lifestyle changes and should be commended for her success. Pt feelss she has achieved her goals during cardiac rehab.   Pt plans to continue exercise by walking for 30 minutes 4-5 days a week. Karen West increased her distance on her post exercise walk test by 113 feet. We are proud of Karen West's progress!Harrell Gave RN BSN

## 2019-12-29 ENCOUNTER — Encounter (HOSPITAL_COMMUNITY): Payer: Self-pay | Admitting: Gastroenterology

## 2019-12-29 ENCOUNTER — Encounter: Payer: Self-pay | Admitting: Cardiology

## 2019-12-29 ENCOUNTER — Other Ambulatory Visit: Payer: Self-pay

## 2019-12-29 NOTE — Telephone Encounter (Signed)
error 

## 2019-12-30 ENCOUNTER — Other Ambulatory Visit (HOSPITAL_COMMUNITY)
Admission: RE | Admit: 2019-12-30 | Discharge: 2019-12-30 | Disposition: A | Discharge status: Home / Self Care | Payer: Medicare Other | Source: Ambulatory Visit | Attending: Gastroenterology | Admitting: Gastroenterology

## 2019-12-30 ENCOUNTER — Telehealth: Payer: Self-pay

## 2019-12-30 DIAGNOSIS — Z01812 Encounter for preprocedural laboratory examination: Secondary | ICD-10-CM | POA: Insufficient documentation

## 2019-12-30 DIAGNOSIS — Z20822 Contact with and (suspected) exposure to covid-19: Secondary | ICD-10-CM | POA: Diagnosis not present

## 2019-12-30 LAB — SARS CORONAVIRUS 2 (TAT 6-24 HRS): SARS Coronavirus 2: NEGATIVE

## 2019-12-30 NOTE — Telephone Encounter (Signed)
Antiplatelet medications to be addressed by pre-op team  Procedure: UPPER ESOPHAGEAL ENDOSCOPIC ULTRASOUND (EUS)  Date of procedure: 01/02/20

## 2019-12-30 NOTE — Telephone Encounter (Signed)
Left message for Ashely, surgery scheduler for Dr. Benson Norway. Left message to call back and confirm if the procedure can be done with the pt on Brilinta. See previous notes. Pt had a stent 07/03/19 with dual antiplatelet therapy.

## 2019-12-30 NOTE — Telephone Encounter (Signed)
Karen West underwent cardiac catheterization with extensive RCA stent placement on 07/03/2019.  Recommendations were made for dual antiplatelet therapy for minimum of 12 months.  Please contact requesting office and ask if it is necessary for her to stop her Brilinta.  Or can the procedure be done while she remains on her Brilinta.  Thank you.

## 2019-12-30 NOTE — Telephone Encounter (Signed)
   [facility] Pre-operative Risk Assessment    Request for surgical clearance:  1. What type of surgery is being performed? UPPER ESOPHAGEAL ENDOSCOPIC ULTRASOUND (EUS)   2. When is this surgery scheduled? 01-02-2020   3. What type of clearance is required (medical clearance vs. Pharmacy clearance to hold med vs. Both)? BOTH  4. Are there any medications that need to be held prior to surgery and how long?BRILINTA   5. Practice name and name of physician performing surgery? [facility] DR PATRICK HUNG    6. What is the office phone number? [phone]   7.   What is the office fax number? [phone]  8.   Anesthesia type (None, local, MAC, general) ? PROPOFOL

## 2019-12-31 NOTE — Telephone Encounter (Signed)
   Primary Cardiologist: Minus Breeding, MD  Chart reviewed as part of pre-operative protocol coverage. Given past medical history and time since last visit, based on ACC/AHA guidelines, SIBONEY REQUEJO would be at acceptable risk for the planned procedure without further cardiovascular testing.   Patient will need to continue Brilinta through procedure.  I will route this recommendation to the requesting party via Epic fax function and remove from pre-op pool.  Please call with questions.  Jossie Ng. Emerita Berkemeier NP-C    12/31/2019, 9:51 AM [place] [address] Office [phone] Fax [phone]

## 2020-01-08 ENCOUNTER — Other Ambulatory Visit: Payer: Self-pay | Admitting: *Deleted

## 2020-01-08 NOTE — Patient Outreach (Signed)
[facility]) Care Management  01/08/2020  Karen West 1940/06/14 [id number]  Telephone outreach for chronic disease management: CAD, DM, HTN  Unsuccessful, left message and requested a return call. Will call next week if she does not return my call.  [name]. Myrtie Neither, MSN, [facility] Gerontological Nurse Practitioner [facility] Care Management [phone]

## 2020-01-14 ENCOUNTER — Other Ambulatory Visit: Payer: Self-pay | Admitting: *Deleted

## 2020-01-14 NOTE — Patient Outreach (Signed)
[place] [facility]) Care Management  01/14/2020  Karen West June 23, 1940 [id number]   Telephone outreach. Unsuccessful, left message and requested a return call.  [name] returned my call. She reports she is doing very well. The cardiopulmonary rehab has been so helpful and she is glad she has been going. She denies any cardiac sxs today, no SOB, CP, or edema.   She reports she is eating a diabetic, heart healthy diet. Her glucose levels are within normal limits. She is getting outdoors weather permitting.  She has and is taking all her medications.  She reports she has a HCPOA and Living Will but this is not in her medical record. Encouraged her to make copies for her daughters and Dr. Shelia Media. She says at this time she wants to be a full code but she would not want to be sustained on life support if her outcome is determended to be poor.  We agreed she is ready for her case to be closed. I did advise her she can call me anytime in the future.  [name]. Myrtie Neither, MSN, [facility] Gerontological Nurse Practitioner [facility] Care Management [phone]

## 2020-01-23 DIAGNOSIS — E038 Other specified hypothyroidism: Secondary | ICD-10-CM | POA: Diagnosis not present

## 2020-01-23 DIAGNOSIS — Z79899 Other long term (current) drug therapy: Secondary | ICD-10-CM | POA: Diagnosis not present

## 2020-01-23 DIAGNOSIS — I10 Essential (primary) hypertension: Secondary | ICD-10-CM | POA: Diagnosis not present

## 2020-01-23 DIAGNOSIS — E1165 Type 2 diabetes mellitus with hyperglycemia: Secondary | ICD-10-CM | POA: Diagnosis not present

## 2020-01-23 DIAGNOSIS — Z8739 Personal history of other diseases of the musculoskeletal system and connective tissue: Secondary | ICD-10-CM | POA: Diagnosis not present

## 2020-01-23 DIAGNOSIS — D649 Anemia, unspecified: Secondary | ICD-10-CM | POA: Diagnosis not present

## 2020-01-23 DIAGNOSIS — E785 Hyperlipidemia, unspecified: Secondary | ICD-10-CM | POA: Diagnosis not present

## 2020-01-27 DIAGNOSIS — D649 Anemia, unspecified: Secondary | ICD-10-CM | POA: Diagnosis not present

## 2020-01-27 DIAGNOSIS — E1121 Type 2 diabetes mellitus with diabetic nephropathy: Secondary | ICD-10-CM | POA: Diagnosis not present

## 2020-01-27 DIAGNOSIS — I10 Essential (primary) hypertension: Secondary | ICD-10-CM | POA: Diagnosis not present

## 2020-01-27 DIAGNOSIS — Z8739 Personal history of other diseases of the musculoskeletal system and connective tissue: Secondary | ICD-10-CM | POA: Diagnosis not present

## 2020-01-27 DIAGNOSIS — E039 Hypothyroidism, unspecified: Secondary | ICD-10-CM | POA: Diagnosis not present

## 2020-01-27 DIAGNOSIS — E782 Mixed hyperlipidemia: Secondary | ICD-10-CM | POA: Diagnosis not present

## 2020-01-27 DIAGNOSIS — I252 Old myocardial infarction: Secondary | ICD-10-CM | POA: Diagnosis not present

## 2020-02-05 ENCOUNTER — Telehealth: Payer: Self-pay | Admitting: Internal Medicine

## 2020-02-05 ENCOUNTER — Ambulatory Visit (INDEPENDENT_AMBULATORY_CARE_PROVIDER_SITE_OTHER): Payer: Medicare Other | Admitting: Internal Medicine

## 2020-02-05 ENCOUNTER — Encounter: Payer: Self-pay | Admitting: Internal Medicine

## 2020-02-05 ENCOUNTER — Other Ambulatory Visit: Payer: Self-pay

## 2020-02-05 VITALS — BP 143/79 | HR 73 | Ht 62.0 in | Wt 150.2 lb

## 2020-02-05 DIAGNOSIS — T466X5A Adverse effect of antihyperlipidemic and antiarteriosclerotic drugs, initial encounter: Secondary | ICD-10-CM | POA: Diagnosis not present

## 2020-02-05 DIAGNOSIS — G720 Drug-induced myopathy: Secondary | ICD-10-CM

## 2020-02-05 DIAGNOSIS — R079 Chest pain, unspecified: Secondary | ICD-10-CM

## 2020-02-05 DIAGNOSIS — E7801 Familial hypercholesterolemia: Secondary | ICD-10-CM | POA: Diagnosis not present

## 2020-02-05 DIAGNOSIS — I2510 Atherosclerotic heart disease of native coronary artery without angina pectoris: Secondary | ICD-10-CM | POA: Diagnosis not present

## 2020-02-05 NOTE — Telephone Encounter (Signed)
PA for praluent submitted via CMM - [facility] Key: [id number] - PA Case ID: [id number]

## 2020-02-05 NOTE — Patient Instructions (Signed)
Medication Instructions:  Your physician recommends that you continue on your current medications as directed. Please refer to the Current Medication list given to you today.  *If you need a refill on your cardiac medications before your next appointment, please call your pharmacy*   Lab Work: FASTING lipid panel in 1 year  If you have labs (blood work) drawn today and your tests are completely normal, you will receive your results only by: Marland Kitchen MyChart Message (if you have MyChart) OR . A paper copy in the mail If you have any lab test that is abnormal or we need to change your treatment, we will call you to review the results.   Testing/Procedures: NONE   Follow-Up: At [facility], you and your health needs are our priority.  As part of our continuing mission to provide you with exceptional heart care, we have created designated Provider Care Teams.  These Care Teams include your primary Cardiologist (physician) and Advanced Practice Providers (APPs -  Physician Assistants and Nurse Practitioners) who all work together to provide you with the care you need, when you need it.  We recommend signing up for the patient portal called "MyChart".  Sign up information is provided on this After Visit Summary.  MyChart is used to connect with patients for Virtual Visits (Telemedicine).  Patients are able to view lab/test results, encounter notes, upcoming appointments, etc.  Non-urgent messages can be sent to your provider as well.   To learn more about what you can do with MyChart, go to [email or web address].    Your next appointment:   12 month(s) - lipid clinic  The format for your next appointment:   In Person  Provider:   K. Mali Hilty, MD   Other Instructions

## 2020-02-05 NOTE — Progress Notes (Signed)
LIPID CLINIC CONSULT NOTE  Chief Complaint:  Follow-up dyslipidemia  Primary Care Physician: Deland Pretty, MD  Primary Cardiologist:  Minus Breeding, MD  HPI:  Karen West is a 79 y.o. female who is being seen today for the evaluation of dyslipidemia at the request of Coletta Memos, NP.  This is a 79 year old female kindly referred for management of dyslipidemia.  Ms. Masse unfortunately coronary artery disease and had PCI in April 2021 to the right coronary artery.  Despite a difficult procedure she is done pretty well with this.  At that time she was ready on 20 mg of rosuvastatin and that was increased up to 40 mg.  Recently however though she was hospitalized with diarrhea, dehydration, markedly elevated liver enzymes and high CK which is suggestive of a rhabdomyolysis.  She had significant bilateral leg pain and weakness with CKs in the thousands.  Subsequently they have improved and labs from her PCP today show normal values with a CK of 62 and an AST and ALT of 15 and 18 respectively.  Her last lipid profile showed total cholesterol 158, triglycerides 109, HDL 54 and LDL of 82.  Subsequently cholesterol was retested in June 2021 which showed total cholesterol 275, triglycerides 135, HDL 51 and LDL of 199.  Findings are very significant and suggestive of familial hyperlipidemia.  This would be consistent with her aggressive coronary artery disease and bilateral carotid artery disease as well as atherosclerosis of the aorta.  02/05/2020  Ms. Eaglin returns today for follow-up.  Overall she seems to be doing well.  We did get repeat lipid numbers from her PCP.  This does show a significant improvement in her cholesterol.  Total is now 158, triglycerides 99, HDL 52 and LDL of 86.  She said she also had had repeat liver enzymes tested which she would provide to [place] but were not available today.  This cholesterol is improved significantly from total cholesterol as high as 275 in June  and LDL of 196 in August.  She also noted recently she has been having some sharp and atypical type chest pains.  She wanted [place] to check an EKG today.  PMHx:  Past Medical History:  Diagnosis Date  . Atherosclerosis of aorta ([place])    a. 01/2017/03/2017 - noted on high res chest CTs.  . Breast cancer ([place])    a. Bilateral --> s/p left mastectomy  . Carotid artery disease ([place])    a. 10/3380 w/ 50-53% LICA stenosis and <97% RICA stenosis; b. 10/2015 Carotid U/S: < 50% BICA stenosis  . Chest pain    a. 09/2011 MV: EF 68%, no ischemia/infarct.  . Chronic anemia   . Chronic headaches    denies  . Coronary artery calcification seen on CT scan    a. 01/2017 High res CT: atherosclerotic calcification of the arterial vascularture, including severe involvement of the coronary arteries; b. 03/2017 CT Chest: coronary and Ao atheroscelrosis.  Marland Kitchen GERD (gastroesophageal reflux disease)   . History of echocardiogram    a. 09/2011 Echo: EF 55-60%, no rwma, triv AI, PASP 65mmHg.  Marland Kitchen Hyperlipidemia   . Hypertension   . Hyperthyroidism   . Left upper lobe pulmonary nodule    a. 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 05/2017 s/p VATS.  . Multinodular goiter    a. 02/2017 PET scan- Hypermetabolic nodule;  b. 11/3788 s/p thyroidectomy  . Myocardial infarction [facility]) 06/2019  NONSTEMI  . Obesity   . OSA on CPAP    cpap  . Osteoarthritis   . Personal history of radiation therapy 1999  . Right bundle branch block   . Type II or unspecified type diabetes mellitus without mention of complication, uncontrolled     Past Surgical History:  Procedure Laterality Date  . BREAST BIOPSY  1993; 1995; 2000   left; right; left  . BREAST EXCISIONAL BIOPSY Right pt unsure  . CARDIAC CATHETERIZATION    . CORONARY STENT INTERVENTION N/A 07/03/2019   Procedure: CORONARY STENT INTERVENTION;  Surgeon: Troy Sine, MD;  Location: [place] CV LAB;  Service: Cardiovascular;  Laterality: N/A;  RCA  . LEFT HEART CATH AND CORONARY ANGIOGRAPHY N/A 07/03/2019   Procedure: LEFT HEART CATH AND CORONARY ANGIOGRAPHY;  Surgeon: Troy Sine, MD;  Location: [place] CV LAB;  Service: Cardiovascular;  Laterality: N/A;  . MASTECTOMY Left 2000   left  . THYROIDECTOMY  05/10/2017   VIDEO BRONCHOSCOPY WITH ENDOBRONCHIAL NAVIGATION (N/A)  . THYROIDECTOMY N/A 05/10/2017   Procedure: TOTAL THYROIDECTOMY;  Surgeon: Armandina Gemma, MD;  Location: [place];  Service: General;  Laterality: N/A;  . VIDEO ASSISTED THORACOSCOPY (VATS)/ LOBECTOMY Left 06/04/2017   Procedure: VIDEO ASSISTED THORACOSCOPY (VATS)/LEFT UPPER LOBECTOMY;  Surgeon: [name], MD;  Location: [place];  Service: Thoracic;  Laterality: Left;  Marland Kitchen VIDEO BRONCHOSCOPY WITH ENDOBRONCHIAL NAVIGATION N/A 05/10/2017   Procedure: VIDEO BRONCHOSCOPY WITH ENDOBRONCHIAL NAVIGATION;  Surgeon: [name], MD;  Location: [place];  Service: Thoracic;  Laterality: N/A;  . VIDEO BRONCHOSCOPY WITH ENDOBRONCHIAL ULTRASOUND N/A 06/04/2017   Procedure: VIDEO BRONCHOSCOPY WITH ENDOBRONCHIAL ULTRASOUND;  Surgeon: [name], MD;  Location: [facility] OR;  Service: Thoracic;  Laterality: N/A;    FAMHx:  Family History  Problem Relation Age of Onset  . Diabetes Brother        x3  . Hypertension Brother        x3  . Diabetes Brother   . Stroke Brother   . Heart attack Mother 35       Mother Died of MI age 67  . Diabetes Sister   . Stroke Sister     SOCHx:   reports that she has never smoked. She has never used smokeless tobacco. She reports current alcohol use. She reports that she does not use drugs.  ALLERGIES:  Allergies  Allergen Reactions  . Tramadol Nausea And Vomiting and Other (See Comments)    "got sick to my stomach and was throwing up blood; it put me in the hospital")  . Lisinopril Rash and Other (See Comments)    Renal failure   . Tapazole [Methimazole]  Itching and Rash    ROS: Pertinent items noted in HPI and remainder of comprehensive ROS otherwise negative.  HOME MEDS: Current Outpatient Medications on File Prior to Visit  Medication Sig Dispense Refill  . acetaminophen (TYLENOL) 500 MG tablet Take 500-1,000 mg by mouth every 6 (six) hours as needed for moderate pain or headache.     . Alirocumab (PRALUENT) 150 MG/ML SOAJ Inject 1 Dose into the skin every 14 (fourteen) days. 2 pen 11  . amLODipine (NORVASC) 10 MG tablet Take 10 mg by mouth daily.    Marland Kitchen aspirin EC 81 MG tablet Take 81 mg by mouth daily. Swallow whole.    . carvedilol (COREG) 6.25 MG tablet Take 6.25 mg by mouth 2 (two) times daily.    . Cholecalciferol (VITAMIN D3) 50  MCG (2000 UT) TABS Take 2,000 Units by mouth daily.    . Empagliflozin-Linaglip-Metform (TRIJARDY XR) 12.5-2.07-998 MG TB24 Take 1 tablet by mouth in the morning and at bedtime.    Marland Kitchen ezetimibe (ZETIA) 10 MG tablet Take 10 mg by mouth every evening.    . hydrocortisone cream 1 % Apply 1 application topically 2 (two) times daily as needed for itching.     . levothyroxine (SYNTHROID, LEVOTHROID) 88 MCG tablet Take 88 mcg by mouth daily before breakfast.   5  . nitroGLYCERIN (NITROSTAT) 0.4 MG SL tablet Place 1 tablet (0.4 mg total) under the tongue every 5 (five) minutes as needed for chest pain. 25 tablet 3  . Omega-3 Fatty Acids (FISH OIL) 1200 MG CAPS Take 1,200 mg by mouth 2 (two) times daily.     Marland Kitchen omeprazole (PRILOSEC) 20 MG capsule Take 20 mg by mouth 2 (two) times daily.     . ticagrelor (BRILINTA) 90 MG TABS tablet Take 1 tablet (90 mg total) by mouth 2 (two) times daily. 180 tablet 3  . vitamin B-12 (CYANOCOBALAMIN) 1000 MCG tablet Take 1,000 mcg by mouth daily.     No current facility-administered medications on file prior to visit.    LABS/IMAGING: No results found for this or any previous visit (from the past 48 hour(s)). No results found.  LIPID PANEL:    Component Value Date/Time   CHOL  121 07/03/2019 0051   TRIG 113 07/03/2019 0051   HDL 37 (L) 07/03/2019 0051   CHOLHDL 3.3 07/03/2019 0051   VLDL 23 07/03/2019 0051   LDLCALC 61 07/03/2019 0051    WEIGHTS: Wt Readings from Last 3 Encounters:  02/05/20 150 lb 3.2 oz (68.1 kg)  12/22/19 147 lb 4.3 oz (66.8 kg)  11/25/19 146 lb 2.6 oz (66.3 kg)    VITALS: BP (!) 143/79   Pulse 73   Ht 5\' 2"  (1.575 m)   Wt 150 lb 3.2 oz (68.1 kg)   SpO2 100%   BMI 27.47 kg/m   EXAM: Deferred  EKG: Normal sinus rhythm at 70, possible left atrial enlargement, incomplete right bundle branch block-personally reviewed  ASSESSMENT: 1. Probable familial hyperlipidemia, LDL greater than 190 off therapy 2. Coronary artery disease with prior PCI 3. Bilateral carotid artery disease 4. Aortic atherosclerosis 5. Type 2 diabetes 6. Hypertension 7. OSA on CPAP 8. Chest pain  PLAN: 1.   Ms. Payer has had marked reduction in her dyslipidemia.  She seems to be tolerating Praluent well without any side effects.  She should remain on ezetimibe as well.  Although LDL remains above target I think with continued diet and more physical activity she may reach that.  Alternatively we may have to reintroduce a low dose of high potency statin such as rosuvastatin 5 mg weekly or every other day.  An EKG was performed at her request today because she had had some recent chest pain that sounded somewhat atypical although she has known coronary disease.  EKG was personally reviewed and does not demonstrate any ischemic changes compared to her prior EKG.  She does have follow-up with her primary cardiologist on December 6.  She can follow-up with me annually or sooner as necessary.  Pixie Casino, MD, [facility], [place] Director of the Advanced Lipid Disorders &  Cardiovascular Risk Reduction Clinic Diplomate of the American Board of Clinical Lipidology Attending Cardiologist  Direct Dial: [phone]  Fax:  [phone]  Website:  [email or web address] 02/05/2020, 10:08 AM

## 2020-02-06 ENCOUNTER — Ambulatory Visit: Payer: Medicare Other | Attending: Internal Medicine

## 2020-02-06 DIAGNOSIS — Z23 Encounter for immunization: Secondary | ICD-10-CM

## 2020-02-06 NOTE — Progress Notes (Signed)
   Covid-19 Vaccination Clinic  Name:  Karen West    MRN: [id number] DOB: 18-Jun-1940  02/06/2020  [name] was observed post Covid-19 immunization for 15 minutes without incident. She was provided with Vaccine Information Sheet and instruction to access the V-Safe system.   [name] was instructed to call 911 with any severe reactions post vaccine: Marland Kitchen Difficulty breathing  . Swelling of face and throat  . A fast heartbeat  . A bad rash all over body  . Dizziness and weakness   Immunizations Administered    No immunizations on file.

## 2020-02-09 NOTE — Telephone Encounter (Signed)
Approved. This drug has been approved under the Member's Medicare Part D benefit. Approved quantity: 2 <> per 28 day(s). You may fill up to a 90 day supply except for those on Specialty Tier 5, which can be filled up to a 30 day supply. Please call the pharmacy to process the prescription claim.

## 2020-02-22 NOTE — Progress Notes (Signed)
Cardiology Office Note   Date:  02/23/2020   ID:  Karen West, DOB 08-11-1940, MRN [id number]  PCP:  Deland Pretty, MD  Cardiologist:   Minus Breeding, MD    Chief Complaint  Patient presents with  . Coronary Artery Disease      History of Present Illness: Karen West is a 79 y.o. female who presents for follow up of CAD.  She is status post PCI/DES to the RCA.  She had rhabdo on Crestor.  She done quite well from a cardiovascular standpoint.  She does some activities and is not describing any cardiovascular symptoms.  She has none of the symptoms that she had at the time of her PCI earlier this year. The patient denies any new symptoms such as chest discomfort, neck or arm discomfort. There has been no new shortness of breath, PND or orthopnea. There have been no reported palpitations, presyncope or syncope.    Past Medical History:  Diagnosis Date  . Atherosclerosis of aorta ([place])    a. 01/2017/03/2017 - noted on high res chest CTs.  . Breast cancer ([place])    a. Bilateral --> s/p left mastectomy  . Carotid artery disease ([place])    a. 06/1935 w/ 90-24% LICA stenosis and <09% RICA stenosis; b. 10/2015 Carotid U/S: < 50% BICA stenosis  . Chest pain    a. 09/2011 MV: EF 68%, no ischemia/infarct.  . Chronic anemia   . Chronic headaches    denies  . Coronary artery calcification seen on CT scan    a. 01/2017 High res CT: atherosclerotic calcification of the arterial vascularture, including severe involvement of the coronary arteries; b. 03/2017 CT Chest: coronary and Ao atheroscelrosis.  Marland Kitchen GERD (gastroesophageal reflux disease)   . History of echocardiogram    a. 09/2011 Echo: EF 55-60%, no rwma, triv AI, PASP 57mmHg.  Marland Kitchen Hyperlipidemia   . Hypertension   . Hyperthyroidism   . Left upper lobe pulmonary nodule    a. 02/2017 PET: slowing enlarging 1.7cm LUL nodule w/ low-grade metabolic activity; b. 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 05/2017 s/p VATS.  . Multinodular  goiter    a. 02/2017 PET scan- Hypermetabolic nodule;  b. 11/9240 s/p thyroidectomy  . Myocardial infarction ([place]) 06/2019   NONSTEMI  . Obesity   . OSA on CPAP    cpap  . Osteoarthritis   . Personal history of radiation therapy 1999  . Right bundle branch block   . Type II or unspecified type diabetes mellitus without mention of complication, uncontrolled     Past Surgical History:  Procedure Laterality Date  . BREAST BIOPSY  1993; 1995; 2000   left; right; left  . BREAST EXCISIONAL BIOPSY Right pt unsure  . CARDIAC CATHETERIZATION    . CORONARY STENT INTERVENTION N/A 07/03/2019   Procedure: CORONARY STENT INTERVENTION;  Surgeon: Troy Sine, MD;  Location: [place] CV LAB;  Service: Cardiovascular;  Laterality: N/A;  RCA  . LEFT HEART CATH AND CORONARY ANGIOGRAPHY N/A 07/03/2019   Procedure: LEFT HEART CATH AND CORONARY ANGIOGRAPHY;  Surgeon: Troy Sine, MD;  Location: [place] CV LAB;  Service: Cardiovascular;  Laterality: N/A;  . MASTECTOMY Left 2000   left  . THYROIDECTOMY  05/10/2017   VIDEO BRONCHOSCOPY WITH ENDOBRONCHIAL NAVIGATION (N/A)  . THYROIDECTOMY N/A 05/10/2017   Procedure: TOTAL THYROIDECTOMY;  Surgeon: Armandina Gemma, MD;  Location: [place];  Service: General;  Laterality: N/A;  . VIDEO ASSISTED THORACOSCOPY (VATS)/ LOBECTOMY Left 06/04/2017  Procedure: VIDEO ASSISTED THORACOSCOPY (VATS)/LEFT UPPER LOBECTOMY;  Surgeon: [name], MD;  Location: [place];  Service: Thoracic;  Laterality: Left;  Marland Kitchen VIDEO BRONCHOSCOPY WITH ENDOBRONCHIAL NAVIGATION N/A 05/10/2017   Procedure: VIDEO BRONCHOSCOPY WITH ENDOBRONCHIAL NAVIGATION;  Surgeon: [name], MD;  Location: [place];  Service: Thoracic;  Laterality: N/A;  . VIDEO BRONCHOSCOPY WITH ENDOBRONCHIAL ULTRASOUND N/A 06/04/2017   Procedure: VIDEO BRONCHOSCOPY WITH ENDOBRONCHIAL ULTRASOUND;  Surgeon: [name], MD;  Location: [facility] OR;  Service: Thoracic;  Laterality: N/A;     Current  Outpatient Medications  Medication Sig Dispense Refill  . acetaminophen (TYLENOL) 500 MG tablet Take 500-1,000 mg by mouth every 6 (six) hours as needed for moderate pain or headache.     . Alirocumab (PRALUENT) 150 MG/ML SOAJ Inject 1 Dose into the skin every 14 (fourteen) days. 2 pen 11  . amLODipine (NORVASC) 10 MG tablet Take 10 mg by mouth daily.    Marland Kitchen aspirin EC 81 MG tablet Take 81 mg by mouth daily. Swallow whole.    . carvedilol (COREG) 12.5 MG tablet Take 1 tablet (12.5 mg total) by mouth 2 (two) times daily. 180 tablet 3  . Cholecalciferol (VITAMIN D3) 50 MCG (2000 UT) TABS Take 2,000 Units by mouth daily.    . Empagliflozin-Linaglip-Metform (TRIJARDY XR) 12.5-2.07-998 MG TB24 Take 1 tablet by mouth in the morning and at bedtime.    Marland Kitchen ezetimibe (ZETIA) 10 MG tablet Take 10 mg by mouth every evening.    . hydrocortisone cream 1 % Apply 1 application topically 2 (two) times daily as needed for itching.     . levothyroxine (SYNTHROID, LEVOTHROID) 88 MCG tablet Take 88 mcg by mouth daily before breakfast.   5  . Omega-3 Fatty Acids (FISH OIL) 1200 MG CAPS Take 1,200 mg by mouth 2 (two) times daily.     Marland Kitchen omeprazole (PRILOSEC) 20 MG capsule Take 20 mg by mouth 2 (two) times daily.     . ticagrelor (BRILINTA) 90 MG TABS tablet Take 1 tablet (90 mg total) by mouth 2 (two) times daily. 180 tablet 3  . vitamin B-12 (CYANOCOBALAMIN) 1000 MCG tablet Take 1,000 mcg by mouth daily.    . Alirocumab (PRALUENT) 150 MG/ML SOAJ Inject 150 mg into the skin every 14 (fourteen) days. 1 mL 0  . nitroGLYCERIN (NITROSTAT) 0.4 MG SL tablet Place 1 tablet (0.4 mg total) under the tongue every 5 (five) minutes as needed for chest pain. 25 tablet 3   No current facility-administered medications for this visit.    Allergies:   Tramadol, Lisinopril, and Tapazole [methimazole]    ROS:  Please see the history of present illness.   Otherwise, review of systems are positive for none.   All other systems are  reviewed and negative.    PHYSICAL EXAM: VS:  BP 134/66   Pulse 73   Ht 5\' 2"  (1.575 m)   Wt 154 lb (69.9 kg)   SpO2 97%   BMI 28.17 kg/m  , BMI Body mass index is 28.17 kg/m. GENERAL:  Well appearing HEENT:  Pupils equal round and reactive, fundi not visualized, oral mucosa unremarkable NECK:  No jugular venous distention, waveform within normal limits, carotid upstroke brisk and symmetric, no bruits, no thyromegaly LYMPHATICS:  No cervical, inguinal adenopathy LUNGS:  Clear to auscultation bilaterally BACK:  No CVA tenderness CHEST:  Unremarkable HEART:  PMI not displaced or sustained,S1 and S2 within normal limits, no S3, no S4, no clicks, no rubs, no  murmurs ABD:  Flat, positive bowel sounds normal in frequency in pitch, no bruits, no rebound, no guarding, no midline pulsatile mass, no hepatomegaly, no splenomegaly EXT:  2 plus pulses throughout, no edema, no cyanosis no clubbing SKIN:  No rashes no nodules NEURO:  Cranial nerves II through XII grossly intact, motor grossly intact throughout PSYCH:  Cognitively intact, oriented to person place and time    EKG:  EKG is not ordered today. NA   Recent Labs: 07/11/2019: Magnesium 2.3 08/26/2019: ALT 181; BUN 18; Creatinine, Ser 1.32; Hemoglobin 8.9; Platelets 301; Potassium 4.1; Sodium 140    Lipid Panel    Component Value Date/Time   CHOL 121 07/03/2019 0051   TRIG 113 07/03/2019 0051   HDL 37 (L) 07/03/2019 0051   CHOLHDL 3.3 07/03/2019 0051   VLDL 23 07/03/2019 0051   LDLCALC 61 07/03/2019 0051      Wt Readings from Last 3 Encounters:  02/23/20 154 lb (69.9 kg)  02/05/20 150 lb 3.2 oz (68.1 kg)  12/22/19 147 lb 4.3 oz (66.8 kg)    Diagnostic cath Dominance: Right  Intervention cath     Other studies Reviewed: Additional studies/ records that were reviewed today include: Cath films reviewed with the patient in the room.  I personally reviewed the images. Review of the above records demonstrates:  Please  see elsewhere in the note.     ASSESSMENT AND PLAN:  DYSLIPIDEMIA:  Probably familial.  She is on PCSK9 per Dr. Debara Pickett.  I would like to have a lipid profile repeated in April when she comes back.  She should come back fasting.  CAD: I am going to medically manage her lesion in the LAD as she has not had symptoms.  I will screen her with a POET (Plain Old Exercise Treadmill) however to make sure there are no high risk features.  To optimize medical therapy I will increase her carvedilol to 12.5 mg twice daily.  Of note given the large degree of stent in her right coronary artery I would continue dual antiplatelet therapy.  DM: A1c is now down to 6.8.  She is on appropriate therapy and followed by Deland Pretty, MD  HTN: This will be managed in the context of optimizing her medical therapy.   Current medicines are reviewed at length with the patient today.  The patient does not have concerns regarding medicines.  The following changes have been made:  no change  Labs/ tests ordered today include:  No orders of the defined types were placed in this encounter.    Disposition:   FU with Coletta Memos NP in April     Signed, Minus Breeding, MD  02/23/2020 10:37 AM    [place]

## 2020-02-23 ENCOUNTER — Other Ambulatory Visit: Payer: Self-pay

## 2020-02-23 ENCOUNTER — Encounter: Payer: Self-pay | Admitting: Cardiology

## 2020-02-23 ENCOUNTER — Ambulatory Visit (INDEPENDENT_AMBULATORY_CARE_PROVIDER_SITE_OTHER): Payer: Medicare Other | Admitting: Cardiology

## 2020-02-23 ENCOUNTER — Telehealth: Payer: Self-pay | Admitting: Cardiology

## 2020-02-23 VITALS — BP 134/66 | HR 73 | Ht 62.0 in | Wt 154.0 lb

## 2020-02-23 DIAGNOSIS — E118 Type 2 diabetes mellitus with unspecified complications: Secondary | ICD-10-CM

## 2020-02-23 DIAGNOSIS — I10 Essential (primary) hypertension: Secondary | ICD-10-CM

## 2020-02-23 DIAGNOSIS — I779 Disorder of arteries and arterioles, unspecified: Secondary | ICD-10-CM | POA: Diagnosis not present

## 2020-02-23 DIAGNOSIS — I2510 Atherosclerotic heart disease of native coronary artery without angina pectoris: Secondary | ICD-10-CM

## 2020-02-23 DIAGNOSIS — E785 Hyperlipidemia, unspecified: Secondary | ICD-10-CM

## 2020-02-23 MED ORDER — CARVEDILOL 12.5 MG PO TABS
12.5000 mg | ORAL_TABLET | Freq: Two times a day (BID) | ORAL | 3 refills | Status: DC
Start: 2020-02-23 — End: 2020-06-23

## 2020-02-23 MED ORDER — PRALUENT 150 MG/ML ~~LOC~~ SOAJ: 150.0000 mg | SUBCUTANEOUS | 0 refills | Status: DC

## 2020-02-23 NOTE — Patient Instructions (Signed)
Medication Instructions:  Increase Carvedilol to 12.5mg  twice a day *If you need a refill on your cardiac medications before your next appointment, please call your pharmacy*  Lab Work: You will need a Covid screen done 3 days before your exercise tolerance test.   Testing/Procedures: Your physician has requested that you have an exercise tolerance test. For further information please visit [email or web address]. Please also follow instruction sheet, as given.  Follow-Up: At [facility], you and your health needs are our priority.  As part of our continuing mission to provide you with exceptional heart care, we have created designated Provider Care Teams.  These Care Teams include your primary Cardiologist (physician) and Advanced Practice Providers (APPs -  Physician Assistants and Nurse Practitioners) who all work together to provide you with the care you need, when you need it.  Your next appointment:   Come back Fasting in April  The format for your next appointment:   In Person  Provider:   Coletta Memos, NP

## 2020-02-23 NOTE — Telephone Encounter (Signed)
Spoke with patient regarding appointment for ETT ordered by Dr. Ival Bible  03/04/20 at 9:45 am at [facility]----COVID prescreening scheduled Monday 03/04/20 at 10:20 am.  WIll mail information to patient and she voiced her understanding.

## 2020-02-27 ENCOUNTER — Telehealth: Payer: Self-pay

## 2020-02-27 DIAGNOSIS — I779 Disorder of arteries and arterioles, unspecified: Secondary | ICD-10-CM

## 2020-02-27 NOTE — Telephone Encounter (Signed)
Error

## 2020-03-01 ENCOUNTER — Other Ambulatory Visit (HOSPITAL_COMMUNITY)
Admission: RE | Admit: 2020-03-01 | Discharge: 2020-03-01 | Disposition: A | Discharge status: Home / Self Care | Payer: Medicare Other | Source: Ambulatory Visit | Attending: Cardiology | Admitting: Cardiology

## 2020-03-01 DIAGNOSIS — Z01812 Encounter for preprocedural laboratory examination: Secondary | ICD-10-CM | POA: Insufficient documentation

## 2020-03-01 DIAGNOSIS — Z20822 Contact with and (suspected) exposure to covid-19: Secondary | ICD-10-CM | POA: Insufficient documentation

## 2020-03-01 LAB — SARS CORONAVIRUS 2 (TAT 6-24 HRS): SARS Coronavirus 2: NEGATIVE

## 2020-03-04 ENCOUNTER — Ambulatory Visit (HOSPITAL_COMMUNITY)
Admission: RE | Admit: 2020-03-04 | Discharge: 2020-03-04 | Disposition: A | Discharge status: Home / Self Care | Payer: Medicare Other | Source: Ambulatory Visit | Attending: Cardiology | Admitting: Cardiology

## 2020-03-04 ENCOUNTER — Other Ambulatory Visit: Payer: Self-pay

## 2020-03-04 DIAGNOSIS — I2510 Atherosclerotic heart disease of native coronary artery without angina pectoris: Secondary | ICD-10-CM

## 2020-03-04 LAB — EXERCISE TOLERANCE TEST
Estimated workload: 4.6 METS
Exercise duration (min): 3 min
Exercise duration (sec): 0 s
MPHR: 143 {beats}/min
Peak HR: 130 {beats}/min
Percent HR: 90 %
Rest HR: 69 {beats}/min

## 2020-03-05 ENCOUNTER — Ambulatory Visit (INDEPENDENT_AMBULATORY_CARE_PROVIDER_SITE_OTHER): Payer: Medicare Other | Admitting: Podiatry

## 2020-03-05 ENCOUNTER — Encounter: Payer: Self-pay | Admitting: Podiatry

## 2020-03-05 DIAGNOSIS — N184 Chronic kidney disease, stage 4 (severe): Secondary | ICD-10-CM | POA: Insufficient documentation

## 2020-03-05 DIAGNOSIS — M2041 Other hammer toe(s) (acquired), right foot: Secondary | ICD-10-CM

## 2020-03-05 DIAGNOSIS — E119 Type 2 diabetes mellitus without complications: Secondary | ICD-10-CM

## 2020-03-05 DIAGNOSIS — B351 Tinea unguium: Secondary | ICD-10-CM | POA: Diagnosis not present

## 2020-03-05 DIAGNOSIS — M79675 Pain in left toe(s): Secondary | ICD-10-CM

## 2020-03-05 DIAGNOSIS — M2012 Hallux valgus (acquired), left foot: Secondary | ICD-10-CM

## 2020-03-05 DIAGNOSIS — I379 Nonrheumatic pulmonary valve disorder, unspecified: Secondary | ICD-10-CM | POA: Insufficient documentation

## 2020-03-05 DIAGNOSIS — E1142 Type 2 diabetes mellitus with diabetic polyneuropathy: Secondary | ICD-10-CM

## 2020-03-05 DIAGNOSIS — E1165 Type 2 diabetes mellitus with hyperglycemia: Secondary | ICD-10-CM | POA: Insufficient documentation

## 2020-03-05 DIAGNOSIS — I6523 Occlusion and stenosis of bilateral carotid arteries: Secondary | ICD-10-CM | POA: Insufficient documentation

## 2020-03-05 DIAGNOSIS — Z853 Personal history of malignant neoplasm of breast: Secondary | ICD-10-CM | POA: Insufficient documentation

## 2020-03-05 DIAGNOSIS — L821 Other seborrheic keratosis: Secondary | ICD-10-CM | POA: Insufficient documentation

## 2020-03-05 DIAGNOSIS — M4120 Other idiopathic scoliosis, site unspecified: Secondary | ICD-10-CM | POA: Insufficient documentation

## 2020-03-05 DIAGNOSIS — Z860100 Personal history of colon polyps, unspecified: Secondary | ICD-10-CM | POA: Insufficient documentation

## 2020-03-05 DIAGNOSIS — M79676 Pain in unspecified toe(s): Secondary | ICD-10-CM | POA: Insufficient documentation

## 2020-03-05 DIAGNOSIS — N281 Cyst of kidney, acquired: Secondary | ICD-10-CM | POA: Insufficient documentation

## 2020-03-05 DIAGNOSIS — K5909 Other constipation: Secondary | ICD-10-CM | POA: Insufficient documentation

## 2020-03-05 DIAGNOSIS — R5383 Other fatigue: Secondary | ICD-10-CM | POA: Insufficient documentation

## 2020-03-05 DIAGNOSIS — R490 Dysphonia: Secondary | ICD-10-CM | POA: Insufficient documentation

## 2020-03-05 DIAGNOSIS — E1121 Type 2 diabetes mellitus with diabetic nephropathy: Secondary | ICD-10-CM | POA: Insufficient documentation

## 2020-03-05 DIAGNOSIS — R350 Frequency of micturition: Secondary | ICD-10-CM | POA: Insufficient documentation

## 2020-03-05 DIAGNOSIS — R002 Palpitations: Secondary | ICD-10-CM | POA: Insufficient documentation

## 2020-03-05 DIAGNOSIS — K5730 Diverticulosis of large intestine without perforation or abscess without bleeding: Secondary | ICD-10-CM | POA: Insufficient documentation

## 2020-03-05 DIAGNOSIS — R945 Abnormal results of liver function studies: Secondary | ICD-10-CM | POA: Insufficient documentation

## 2020-03-05 DIAGNOSIS — I214 Non-ST elevation (NSTEMI) myocardial infarction: Secondary | ICD-10-CM | POA: Insufficient documentation

## 2020-03-05 DIAGNOSIS — E890 Postprocedural hypothyroidism: Secondary | ICD-10-CM | POA: Insufficient documentation

## 2020-03-05 DIAGNOSIS — M79674 Pain in right toe(s): Secondary | ICD-10-CM

## 2020-03-05 DIAGNOSIS — Z0000 Encounter for general adult medical examination without abnormal findings: Secondary | ICD-10-CM | POA: Insufficient documentation

## 2020-03-05 DIAGNOSIS — Z8739 Personal history of other diseases of the musculoskeletal system and connective tissue: Secondary | ICD-10-CM | POA: Insufficient documentation

## 2020-03-05 DIAGNOSIS — I517 Cardiomegaly: Secondary | ICD-10-CM | POA: Insufficient documentation

## 2020-03-05 DIAGNOSIS — M2042 Other hammer toe(s) (acquired), left foot: Secondary | ICD-10-CM

## 2020-03-05 DIAGNOSIS — H9190 Unspecified hearing loss, unspecified ear: Secondary | ICD-10-CM | POA: Insufficient documentation

## 2020-03-05 DIAGNOSIS — M2011 Hallux valgus (acquired), right foot: Secondary | ICD-10-CM | POA: Diagnosis not present

## 2020-03-05 DIAGNOSIS — K862 Cyst of pancreas: Secondary | ICD-10-CM | POA: Insufficient documentation

## 2020-03-05 DIAGNOSIS — Z7982 Long term (current) use of aspirin: Secondary | ICD-10-CM | POA: Insufficient documentation

## 2020-03-05 DIAGNOSIS — I471 Supraventricular tachycardia: Secondary | ICD-10-CM | POA: Insufficient documentation

## 2020-03-05 DIAGNOSIS — D631 Anemia in chronic kidney disease: Secondary | ICD-10-CM | POA: Insufficient documentation

## 2020-03-05 DIAGNOSIS — M791 Myalgia, unspecified site: Secondary | ICD-10-CM | POA: Insufficient documentation

## 2020-03-05 DIAGNOSIS — K863 Pseudocyst of pancreas: Secondary | ICD-10-CM | POA: Insufficient documentation

## 2020-03-05 DIAGNOSIS — R519 Headache, unspecified: Secondary | ICD-10-CM | POA: Insufficient documentation

## 2020-03-05 DIAGNOSIS — R432 Parageusia: Secondary | ICD-10-CM | POA: Insufficient documentation

## 2020-03-05 DIAGNOSIS — E559 Vitamin D deficiency, unspecified: Secondary | ICD-10-CM | POA: Insufficient documentation

## 2020-03-05 DIAGNOSIS — Z9989 Dependence on other enabling machines and devices: Secondary | ICD-10-CM | POA: Insufficient documentation

## 2020-03-08 NOTE — Progress Notes (Signed)
ANNUAL DIABETIC FOOT EXAM  Subjective: Karen West presents today for for annual diabetic foot examination.  Patient relates 8 year h/o diabetes.  Patient denies any h/o foot wounds.  Patient does relate symptoms of foot numbness.  Patient denies symptoms of foot tingling.  Patient denies symptoms of burning in feet.  Patient's blood sugar was 102 mg/dl this morning.   Deland Pretty, MD is patient's PCP. Last visit was 01/27/2020.  Past Medical History:  Diagnosis Date  . Atherosclerosis of aorta ([place])    a. 01/2017/03/2017 - noted on high res chest CTs.  . Breast cancer ([place])    a. Bilateral --> s/p left mastectomy  . Carotid artery disease ([place])    a. 06/345 w/ 42-59% LICA stenosis and <56% RICA stenosis; b. 10/2015 Carotid U/S: < 50% BICA stenosis  . Chest pain    a. 09/2011 MV: EF 68%, no ischemia/infarct.  . Chronic anemia   . Chronic headaches    denies  . Coronary artery calcification seen on CT scan    a. 01/2017 High res CT: atherosclerotic calcification of the arterial vascularture, including severe involvement of the coronary arteries; b. 03/2017 CT Chest: coronary and Ao atheroscelrosis.  Marland Kitchen GERD (gastroesophageal reflux disease)   . History of echocardiogram    a. 09/2011 Echo: EF 55-60%, no rwma, triv AI, PASP 58mmHg.  Marland Kitchen Hyperlipidemia   . Hypertension   . Hyperthyroidism   . Left upper lobe pulmonary nodule    a. 02/2017 PET: slowing enlarging 1.7cm LUL nodule w/ low-grade metabolic activity; b. 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 05/2017 s/p VATS.  . Multinodular goiter    a. 02/2017 PET scan- Hypermetabolic nodule;  b. 06/3327 s/p thyroidectomy  . Myocardial infarction ([place]) 06/2019   NONSTEMI  . Obesity   . OSA on CPAP    cpap  . Osteoarthritis   . Personal history of radiation therapy 1999  . Right bundle branch block   . Type II or unspecified type diabetes mellitus without mention of complication, uncontrolled    Patient Active Problem  List   Diagnosis Date Noted  . Abnormal results of liver function studies 03/05/2020  . Acute non-ST segment elevation myocardial infarction ([place]) 03/05/2020  . Anemia in chronic kidney disease 03/05/2020  . Atherosclerosis of both carotid arteries 03/05/2020  . Atrial tachycardia ([place]) 03/05/2020  . Cardiomegaly 03/05/2020  . Chronic kidney disease, stage 4 (severe) ([place]) 03/05/2020  . Chronic constipation 03/05/2020  . Cyst and pseudocyst of pancreas 03/05/2020  . Cyst of kidney, acquired 03/05/2020  . Dependence on other enabling machines and devices 03/05/2020  . Diabetic renal disease ([place]) 03/05/2020  . Diverticulosis of colon 03/05/2020  . Dysgeusia 03/05/2020  . Encounter for general adult medical examination without abnormal findings 03/05/2020  . Fatigue 03/05/2020  . Hard of hearing 03/05/2020  . Headache 03/05/2020  . History of musculoskeletal disease 03/05/2020  . Hoarseness 03/05/2020  . Hypercalcemia 03/05/2020  . Hyperglycemia due to type 2 diabetes mellitus ([place]) 03/05/2020  . Hypomagnesemia 03/05/2020  . Long term (current) use of aspirin 03/05/2020  . Increased frequency of urination 03/05/2020  . Idiopathic scoliosis 03/05/2020  . Myalgia 03/05/2020  . Palpitations 03/05/2020  . Personal history of colonic polyps 03/05/2020  . 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  . Seborrheic keratosis 03/05/2020  . Toe pain 03/05/2020  . Pulmonary valve disorder 03/05/2020  . Personal history of malignant neoplasm of breast 03/05/2020  . Vitamin D deficiency 03/05/2020  . Rhabdomyolysis 08/22/2019  . Carotid  artery disease ([place])   . Acute renal failure superimposed on stage 3 chronic kidney disease ([place])   . Elevated transaminase level   . NSTEMI (non-ST elevated myocardial infarction) ([place]) 07/02/2019  . Dysphonia 05/21/2018  . Pelvic floor relaxation 10/02/2017  . Blood in urine 10/02/2017  . Left lower quadrant pain 10/02/2017  . Nausea 10/02/2017  .  Nocturia 10/02/2017  . Osteopenia 10/02/2017  . Pain in pelvis 10/02/2017  . Adenocarcinoma of left lung, stage 1 ([place]) 07/26/2017  . Iron deficiency anemia 07/26/2017  . S/P lobectomy of lung   . Long QT interval   . Sinus tachycardia   . Abnormal positron emission tomography (PET) scan 05/06/2017  . Pain of left shoulder joint on movement 04/09/2017  . Rib pain 04/09/2017  . BPPV (benign paroxysmal positional vertigo), left 03/27/2017  . Lipoma 04/06/2016  . Hypersomnia 01/18/2016  . Upper airway resistance syndrome 01/18/2016  . Hyperthyroidism 10/05/2011  . Chest pain at rest 10/04/2011  . Angina at rest [facility]) 10/04/2011  . Headache(784.0) 10/04/2011  . FH: MI (myocardial infarction) 10/04/2011  . Chest pressure 10/04/2011  . Hyperlipidemia   . Hypertension   . OSA on CPAP   . Breast cancer ([place])   . Chronic anemia   . GERD (gastroesophageal reflux disease)   . Obesity   . Osteoarthritis   . Right bundle branch block   . Atherosclerosis of aorta ([place])   . Multiple thyroid nodules   . Type II or unspecified type diabetes mellitus without mention of complication, uncontrolled   . Chronic headaches   . OBESITY 11/17/2009  . OBSTRUCTIVE SLEEP APNEA 01/05/2009  . Type 2 diabetes mellitus with complication, without long-term current use of insulin ([place]) 11/12/2008  . HYPERLIPIDEMIA 11/12/2008  . Essential hypertension 11/12/2008  . Lung nodule 11/12/2008  . G E R D 11/12/2008  . UNSPECIFIED DISORDER OF KIDNEY AND URETER 11/12/2008  . OSTEOARTHRITIS 11/12/2008   Past Surgical History:  Procedure Laterality Date  . BREAST BIOPSY  1993; 1995; 2000   left; right; left  . BREAST EXCISIONAL BIOPSY Right pt unsure  . CARDIAC CATHETERIZATION    . CORONARY STENT INTERVENTION N/A 07/03/2019   Procedure: CORONARY STENT INTERVENTION;  Surgeon: Troy Sine, MD;  Location: [place];  Service: Cardiovascular;  Laterality: N/A;  RCA  . LEFT HEART CATH AND CORONARY  ANGIOGRAPHY N/A 07/03/2019   Procedure: LEFT HEART CATH AND CORONARY ANGIOGRAPHY;  Surgeon: Troy Sine, MD;  Location: [place] [facility];  Service: Cardiovascular;  Laterality: N/A;  . MASTECTOMY Left 2000   left  . THYROIDECTOMY  05/10/2017   VIDEO BRONCHOSCOPY WITH ENDOBRONCHIAL NAVIGATION (N/A)  . THYROIDECTOMY N/A 05/10/2017   Procedure: TOTAL THYROIDECTOMY;  Surgeon: Armandina Gemma, MD;  Location: [facility];  Service: General;  Laterality: N/A;  . VIDEO ASSISTED THORACOSCOPY (VATS)/ LOBECTOMY Left 06/04/2017   Procedure: VIDEO ASSISTED THORACOSCOPY (VATS)/LEFT UPPER LOBECTOMY;  Surgeon: [name], MD;  Location: [facility];  Service: Thoracic;  Laterality: Left;  Marland Kitchen VIDEO BRONCHOSCOPY WITH ENDOBRONCHIAL NAVIGATION N/A 05/10/2017   Procedure: VIDEO BRONCHOSCOPY WITH ENDOBRONCHIAL NAVIGATION;  Surgeon: [name], MD;  Location: [facility];  Service: Thoracic;  Laterality: N/A;  . VIDEO BRONCHOSCOPY WITH ENDOBRONCHIAL ULTRASOUND N/A 06/04/2017   Procedure: VIDEO BRONCHOSCOPY WITH ENDOBRONCHIAL ULTRASOUND;  Surgeon: [name], MD;  [facility];  Service: Thoracic;  Laterality: N/A;   Current Outpatient Medications on File Prior to Visit  Medication Sig Dispense Refill  .  acetaminophen (TYLENOL) 500 MG tablet Take 500-1,000 mg by mouth every 6 (six) hours as needed for moderate pain or headache.     . Alirocumab (PRALUENT) 150 MG/ML SOAJ Inject 1 Dose into the skin every 14 (fourteen) days. 2 pen 11  . Alirocumab (PRALUENT) 150 MG/ML SOAJ Inject 150 mg into the skin every 14 (fourteen) days. 1 mL 0  . amLODipine (NORVASC) 10 MG tablet Take 10 mg by mouth daily.    Marland Kitchen aspirin EC 81 MG tablet Take 81 mg by mouth daily. Swallow whole.    . carvedilol (COREG) 12.5 MG tablet Take 1 tablet (12.5 mg total) by mouth 2 (two) times daily. 180 tablet 3  . Cholecalciferol (VITAMIN D3) 50 MCG (2000 UT) TABS Take 2,000 Units by mouth daily.    . Empagliflozin-Linaglip-Metform  (TRIJARDY XR) 12.5-2.07-998 MG TB24 Take 1 tablet by mouth in the morning and at bedtime.    Marland Kitchen ezetimibe (ZETIA) 10 MG tablet Take 10 mg by mouth every evening.    . hydrocortisone cream 1 % Apply 1 application topically 2 (two) times daily as needed for itching.     . levothyroxine (SYNTHROID, LEVOTHROID) 88 MCG tablet Take 88 mcg by mouth daily before breakfast.   5  . nitroGLYCERIN (NITROSTAT) 0.4 MG SL tablet Place 1 tablet (0.4 mg total) under the tongue every 5 (five) minutes as needed for chest pain. 25 tablet 3  . Omega-3 Fatty Acids (FISH OIL) 1200 MG CAPS Take 1,200 mg by mouth 2 (two) times daily.     Marland Kitchen omeprazole (PRILOSEC) 20 MG capsule Take 20 mg by mouth 2 (two) times daily.     . ticagrelor (BRILINTA) 90 MG TABS tablet Take 1 tablet (90 mg total) by mouth 2 (two) times daily. 180 tablet 3  . vitamin B-12 (CYANOCOBALAMIN) 1000 MCG tablet Take 1,000 mcg by mouth daily.     No current facility-administered medications on file prior to visit.    Allergies  Allergen Reactions  . Tramadol Nausea And Vomiting and Other (See Comments)    "got sick to my stomach and was throwing up blood; it put me in the hospital")  . Lisinopril Rash and Other (See Comments)    Renal failure   . Crestor [Rosuvastatin]     Other reaction(s): rhabdo.  [employer] [Methimazole] Itching and Rash   Social History   Occupational History  . Occupation: retired    Fish farm manager: RETIRED    Comment: accountant  Tobacco Use  . Smoking status: Never Smoker  . Smokeless tobacco: Never Used  Vaping Use  . Vaping Use: Never used  Substance and Sexual Activity  . Alcohol use: Yes    Comment: occ  . Drug use: No  . Sexual activity: Yes    Partners: Male    Birth control/protection: None   Family History  Problem Relation Age of Onset  . Diabetes Brother        x3  . Hypertension Brother        x3  . Diabetes Brother   . Stroke Brother   . Heart attack Mother 83       Mother Died of MI age 57  .  Diabetes Sister   . Stroke Sister    Immunization History  Administered Date(s) Administered  . Influenza,inj,quad, With Preservative 12/19/2015  . Influenza-Unspecified 01/16/2017, 12/18/2017  . Moderna SARS-COV2 Booster Vaccination 02/06/2020  . Moderna Sars-Covid-2 Vaccination 05/30/2019, 06/27/2019  . Pneumococcal-Unspecified 11/19/2007     Review of  Systems: Negative except as noted in the HPI.  Objective: There were no vitals filed for this visit.  Karen West is a pleasant 79 y.o. female in NAD. AAO X 3.  Vascular Examination: Capillary refill time to digits immediate b/l. Palpable DP pulse(s) b/l lower extremities Nonpalpable PT pulse(s) b/l lower extremities. Pedal hair absent. Lower extremity skin temperature gradient within normal limits.  Dermatological Examination: Pedal skin with normal turgor, texture and tone bilaterally. No open wounds bilaterally. No interdigital macerations bilaterally. Toenails 1-5 b/l elongated, discolored, dystrophic, thickened, crumbly with subungual debris and tenderness to dorsal palpation.  Musculoskeletal Examination: Normal muscle strength 5/5 to all lower extremity muscle groups bilaterally. No pain crepitus or joint limitation noted with ROM b/l. Hallux valgus with bunion deformity noted b/l lower extremities. Hammertoes noted to the 2-5 bilaterally.  Footwear Assessment: Does the patient wear appropriate shoes?  Yes. Does the patient need inserts/orthotics? No.  Neurological Examination: Protective sensation intact 5/5 intact bilaterally with 10g monofilament b/l. Vibratory sensation intact b/l. Clonus negative b/l.  Hemoglobin A1C Latest Ref Rng & Units 07/02/2019  HGBA1C 4.8 - 5.6 % 7.3(H)  Some recent data might be hidden   Assessment: 1. Pain due to onychomycosis of toenails of both feet   2. Hallux valgus, acquired, bilateral   3. Acquired hammertoes of both feet   4. Diabetic peripheral neuropathy associated with type  2 diabetes mellitus ([place])   5. Encounter for diabetic foot exam ([place])     ADA Risk Categorization: High Risk  Patient has one or more of the following: Loss of protective sensation Absent pedal pulses Severe Foot deformity History of foot ulcer  Plan: -Examined patient. -Diabetic foot examination performed on today's visit. -Patient to continue soft, supportive shoe gear daily. -Toenails 1-5 b/l were debrided in length and girth with sterile nail nippers and dremel without iatrogenic bleeding.  -Patient to report any pedal injuries to medical professional immediately. -Patient/POA to call should there be question/concern in the interim.  Return in about 4 months (around 07/04/2020) for diabetic foot care.  Marzetta Board, DPM

## 2020-04-17 ENCOUNTER — Other Ambulatory Visit: Payer: Self-pay | Admitting: Medical

## 2020-05-06 DIAGNOSIS — E782 Mixed hyperlipidemia: Secondary | ICD-10-CM | POA: Diagnosis not present

## 2020-05-06 DIAGNOSIS — I10 Essential (primary) hypertension: Secondary | ICD-10-CM | POA: Diagnosis not present

## 2020-05-06 DIAGNOSIS — E039 Hypothyroidism, unspecified: Secondary | ICD-10-CM | POA: Diagnosis not present

## 2020-05-06 DIAGNOSIS — D649 Anemia, unspecified: Secondary | ICD-10-CM | POA: Diagnosis not present

## 2020-05-06 DIAGNOSIS — E1121 Type 2 diabetes mellitus with diabetic nephropathy: Secondary | ICD-10-CM | POA: Diagnosis not present

## 2020-05-07 ENCOUNTER — Other Ambulatory Visit: Payer: Self-pay | Admitting: Internal Medicine

## 2020-05-07 DIAGNOSIS — I6523 Occlusion and stenosis of bilateral carotid arteries: Secondary | ICD-10-CM

## 2020-05-10 DIAGNOSIS — E782 Mixed hyperlipidemia: Secondary | ICD-10-CM | POA: Diagnosis not present

## 2020-05-10 DIAGNOSIS — Z7982 Long term (current) use of aspirin: Secondary | ICD-10-CM | POA: Diagnosis not present

## 2020-05-10 DIAGNOSIS — E0590 Thyrotoxicosis, unspecified without thyrotoxic crisis or storm: Secondary | ICD-10-CM | POA: Diagnosis not present

## 2020-05-10 DIAGNOSIS — E1121 Type 2 diabetes mellitus with diabetic nephropathy: Secondary | ICD-10-CM | POA: Diagnosis not present

## 2020-05-10 DIAGNOSIS — I252 Old myocardial infarction: Secondary | ICD-10-CM | POA: Diagnosis not present

## 2020-05-10 DIAGNOSIS — I10 Essential (primary) hypertension: Secondary | ICD-10-CM | POA: Diagnosis not present

## 2020-05-10 DIAGNOSIS — Z8739 Personal history of other diseases of the musculoskeletal system and connective tissue: Secondary | ICD-10-CM | POA: Diagnosis not present

## 2020-05-11 ENCOUNTER — Other Ambulatory Visit: Payer: Self-pay

## 2020-05-11 ENCOUNTER — Ambulatory Visit
Admission: RE | Admit: 2020-05-11 | Discharge: 2020-05-11 | Disposition: A | Discharge status: Home / Self Care | Payer: Medicare Other | Source: Ambulatory Visit | Attending: Internal Medicine | Admitting: Internal Medicine

## 2020-05-11 DIAGNOSIS — I6523 Occlusion and stenosis of bilateral carotid arteries: Secondary | ICD-10-CM | POA: Diagnosis not present

## 2020-06-03 DIAGNOSIS — E0590 Thyrotoxicosis, unspecified without thyrotoxic crisis or storm: Secondary | ICD-10-CM | POA: Diagnosis not present

## 2020-06-03 DIAGNOSIS — E1121 Type 2 diabetes mellitus with diabetic nephropathy: Secondary | ICD-10-CM | POA: Diagnosis not present

## 2020-06-03 DIAGNOSIS — Z8739 Personal history of other diseases of the musculoskeletal system and connective tissue: Secondary | ICD-10-CM | POA: Diagnosis not present

## 2020-06-03 DIAGNOSIS — I10 Essential (primary) hypertension: Secondary | ICD-10-CM | POA: Diagnosis not present

## 2020-06-03 DIAGNOSIS — I252 Old myocardial infarction: Secondary | ICD-10-CM | POA: Diagnosis not present

## 2020-06-03 DIAGNOSIS — E782 Mixed hyperlipidemia: Secondary | ICD-10-CM | POA: Diagnosis not present

## 2020-06-03 DIAGNOSIS — Z7982 Long term (current) use of aspirin: Secondary | ICD-10-CM | POA: Diagnosis not present

## 2020-06-09 ENCOUNTER — Telehealth: Payer: Self-pay

## 2020-06-09 NOTE — Telephone Encounter (Signed)
NOTES ON FILE FROM [facility] [phone], SENT REFERRAL TO SCHEDULING 

## 2020-06-22 DIAGNOSIS — I2510 Atherosclerotic heart disease of native coronary artery without angina pectoris: Secondary | ICD-10-CM | POA: Insufficient documentation

## 2020-06-22 NOTE — Progress Notes (Signed)
Cardiology Office Note   Date:  06/23/2020   ID:  Karen West, DOB Oct 31, 1940, MRN [id number]  PCP:  Deland Pretty, MD  Cardiologist:   Minus Breeding, MD    Chief Complaint  Patient presents with  . Coronary Artery Disease      History of Present Illness: Karen West is a 80 y.o. female who presents for follow up of CAD.  Karen West is status post PCI/DES to the RCA.  Karen West had rhabdo on Crestor.   After the last visit Karen West had a POET (Plain Old Exercise Treadmill) that was negative for ischemia.   Since I last saw her Karen West does some activities.  Karen West does not get any chest pressure with this.  Karen West does a little walking but Karen West is not particularly active.  Karen West gets short of breath when working on her but Karen West is not describing activity for shortness of breath, PND or orthopnea.  Karen West is not had any new palpitations, presyncope or syncope.  Of note Karen West was switched from beta-blocker metoprolol to carvedilol.  Karen West is not really Karen West has tolerated this as well.  Karen West has had diarrhea at a higher dose.  Karen West has felt more fatigued.   Past Medical History:  Diagnosis Date  . Atherosclerosis of aorta ([place])    a. 01/2017/03/2017 - noted on high res chest CTs.  . Breast cancer ([place])    a. Bilateral --> s/p left mastectomy  . Carotid artery disease ([place])    a. 03/7508 w/ 25-85% LICA stenosis and <27% RICA stenosis; b. 10/2015 Carotid U/S: < 50% BICA stenosis  . Chest pain    a. 09/2011 MV: EF 68%, no ischemia/infarct.  . Chronic anemia   . Chronic headaches    denies  . Coronary artery calcification seen on CT scan    a. 01/2017 High res CT: atherosclerotic calcification of the arterial vascularture, including severe involvement of the coronary arteries; b. 03/2017 CT Chest: coronary and Ao atheroscelrosis.  Marland Kitchen GERD (gastroesophageal reflux disease)   . History of echocardiogram    a. 09/2011 Echo: EF 55-60%, no rwma, triv AI, PASP 94mmHg.  Marland Kitchen Hyperlipidemia   . Hypertension   . Hyperthyroidism    . Left upper lobe pulmonary nodule    a. 02/2017 PET: slowing enlarging 1.7cm LUL nodule w/ low-grade metabolic activity; b. 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 05/2017 s/p VATS.  . Multinodular goiter    a. 02/2017 PET scan- Hypermetabolic nodule;  b. 05/5359 s/p thyroidectomy  . Myocardial infarction ([place]) 06/2019   NONSTEMI  . Obesity   . OSA on CPAP    cpap  . Osteoarthritis   . Personal history of radiation therapy 1999  . Right bundle branch block   . Type II or unspecified type diabetes mellitus without mention of complication, uncontrolled     Past Surgical History:  Procedure Laterality Date  . BREAST BIOPSY  1993; 1995; 2000   left; right; left  . BREAST EXCISIONAL BIOPSY Right pt unsure  . CARDIAC CATHETERIZATION    . CORONARY STENT INTERVENTION N/A 07/03/2019   Procedure: CORONARY STENT INTERVENTION;  Surgeon: Troy Sine, MD;  Location: [facility];  Service: Cardiovascular;  Laterality: N/A;  RCA  . LEFT HEART CATH AND CORONARY ANGIOGRAPHY N/A 07/03/2019   Procedure: LEFT HEART CATH AND CORONARY ANGIOGRAPHY;  Surgeon: Troy Sine, MD;  Location: [facility];  Service: Cardiovascular;  Laterality: N/A;  . MASTECTOMY Left 2000  left  . THYROIDECTOMY  05/10/2017   VIDEO BRONCHOSCOPY WITH ENDOBRONCHIAL NAVIGATION (N/A)  . THYROIDECTOMY N/A 05/10/2017   Procedure: TOTAL THYROIDECTOMY;  Surgeon: Armandina Gemma, MD;  Location: [facility];  Service: General;  Laterality: N/A;  . VIDEO ASSISTED THORACOSCOPY (VATS)/ LOBECTOMY Left 06/04/2017   Procedure: VIDEO ASSISTED THORACOSCOPY (VATS)/LEFT UPPER LOBECTOMY;  Surgeon: [name], MD;  Location: [place];  Service: Thoracic;  Laterality: Left;  Marland Kitchen VIDEO BRONCHOSCOPY WITH ENDOBRONCHIAL NAVIGATION N/A 05/10/2017   Procedure: VIDEO BRONCHOSCOPY WITH ENDOBRONCHIAL NAVIGATION;  Surgeon: [name], MD;  Location: [facility];  Service: Thoracic;  Laterality: N/A;  . VIDEO BRONCHOSCOPY WITH  ENDOBRONCHIAL ULTRASOUND N/A 06/04/2017   Procedure: VIDEO BRONCHOSCOPY WITH ENDOBRONCHIAL ULTRASOUND;  Surgeon: [name], MD;  [facility];  Service: Thoracic;  Laterality: N/A;     Current Outpatient Medications  Medication Sig Dispense Refill  . acetaminophen (TYLENOL) 500 MG tablet Take 500-1,000 mg by mouth every 6 (six) hours as needed for moderate pain or headache.     . Alirocumab (PRALUENT) 150 MG/ML SOAJ Inject 150 mg into the skin every 14 (fourteen) days. 1 mL 0  . amLODipine (NORVASC) 10 MG tablet Take 10 mg by mouth daily.    Marland Kitchen aspirin EC 81 MG tablet Take 81 mg by mouth daily. Swallow whole.    . Cholecalciferol (VITAMIN D3) 50 MCG (2000 UT) TABS Take 2,000 Units by mouth daily.    . Empagliflozin-Linaglip-Metform (TRIJARDY XR) 12.5-2.07-998 MG TB24 Take 1 tablet by mouth in the morning and at bedtime.    Marland Kitchen ezetimibe (ZETIA) 10 MG tablet Take 10 mg by mouth every evening.    . hydrocortisone cream 1 % Apply 1 application topically 2 (two) times daily as needed for itching.     . levothyroxine (SYNTHROID, LEVOTHROID) 88 MCG tablet Take 88 mcg by mouth daily before breakfast.   5  . metoprolol tartrate (LOPRESSOR) 25 MG tablet Take 1 tablet (25 mg total) by mouth 2 (two) times daily. 180 tablet 3  . Omega-3 Fatty Acids (FISH OIL) 1200 MG CAPS Take 1,200 mg by mouth 2 (two) times daily.     Marland Kitchen omeprazole (PRILOSEC) 20 MG capsule Take 20 mg by mouth 2 (two) times daily.     . vitamin B-12 (CYANOCOBALAMIN) 1000 MCG tablet Take 1,000 mcg by mouth daily.    . nitroGLYCERIN (NITROSTAT) 0.4 MG SL tablet Place 1 tablet (0.4 mg total) under the tongue every 5 (five) minutes as needed for chest pain. 25 tablet 3   No current facility-administered medications for this visit.    Allergies:   Tramadol, Lisinopril, Crestor [rosuvastatin], and Tapazole [methimazole]    ROS:  Please see the history of present illness.   Otherwise, review of systems are positive for none.   All  other systems are reviewed and negative.    PHYSICAL EXAM: VS:  BP 136/70   Pulse 68   Ht 5\' 2"  (1.575 m)   Wt 152 lb (68.9 kg)   SpO2 97%   BMI 27.80 kg/m  , BMI Body mass index is 27.8 kg/m. GENERAL:  Well appearing NECK:  No jugular venous distention, waveform within normal limits, carotid upstroke brisk and symmetric, no bruits, no thyromegaly LUNGS:  Clear to auscultation bilaterally CHEST:  Unremarkable HEART:  PMI not displaced or sustained,S1 and S2 within normal limits, no S3, no S4, no clicks, no rubs, 2 out of 6 apical holosystolic murmur, no diastolic murmurs ABD:  Flat, positive bowel  sounds normal in frequency in pitch, no bruits, no rebound, no guarding, no midline pulsatile mass, no hepatomegaly, no splenomegaly EXT:  2 plus pulses throughout, no edema, no cyanosis no clubbing  EKG:  EKG is not ordered today.    Recent Labs: 07/11/2019: Magnesium 2.3 08/26/2019: ALT 181; BUN 18; Creatinine, Ser 1.32; Hemoglobin 8.9; Platelets 301; Potassium 4.1; Sodium 140    Lipid Panel    Component Value Date/Time   CHOL 121 07/03/2019 0051   TRIG 113 07/03/2019 0051   HDL 37 (L) 07/03/2019 0051   CHOLHDL 3.3 07/03/2019 0051   VLDL 23 07/03/2019 0051   LDLCALC 61 07/03/2019 0051      Wt Readings from Last 3 Encounters:  06/23/20 152 lb (68.9 kg)  02/23/20 154 lb (69.9 kg)  02/05/20 150 lb 3.2 oz (68.1 kg)    Diagnostic cath Dominance: Right  Intervention cath     Other studies Reviewed: Additional studies/ records that were reviewed today include: Labs Review of the above records demonstrates:  Please see elsewhere in the note.     ASSESSMENT AND PLAN:  Karen West:    Karen West is treated by Dr. Debara Pickett.   Most recent HDL was 51 with an LDL of 89.  This was in February.**  Probably familial.  Karen West is on PCSK9 per Dr. Debara Pickett.  I would like to have a lipid profile repeated in April when Karen West comes back.  Karen West should come back fasting.  CAD:  Karen West had a negative POET  (Plain Old Exercise Treadmill) late last year.   No further testing is suggested.  Karen West actually can come off the Brilinta has not been a year.  DM: A1c is 6.5 which is the lowest type seen.  Karen West will continue the meds as listed.   HTN:    Her blood pressure is not quite at target.  Karen West states Karen West tolerated the metoprolol better so I will try to titrate this and stop the carvedilol.  I am going to start back with metoprolol tartrate 25 mg twice daily and then Karen West will give me a blood pressure diary in the next step would probably be 37.5 twice daily.  Karen West does have a lower heart rate but Karen West tolerates this.   MR: I will check an echocardiogram.  This was mild to moderate last year.  Current medicines are reviewed at length with the patient today.  The patient does not have concerns regarding medicines.  The following changes have been made:  no change  Labs/ tests ordered today include:   Orders Placed This Encounter  Procedures  . ECHOCARDIOGRAM COMPLETE     Disposition:   FU with me in 1 year yes Karen West wants to stopping that blood work with I Have given her back to Dr. Fraser Din because already labeled   Signed, Minus Breeding, MD  06/23/2020 10:57 AM    [address]

## 2020-06-23 ENCOUNTER — Other Ambulatory Visit: Payer: Self-pay

## 2020-06-23 ENCOUNTER — Encounter: Payer: Self-pay | Admitting: Cardiology

## 2020-06-23 ENCOUNTER — Ambulatory Visit (INDEPENDENT_AMBULATORY_CARE_PROVIDER_SITE_OTHER): Payer: Medicare Other | Admitting: Cardiology

## 2020-06-23 VITALS — BP 136/70 | HR 68 | Ht 62.0 in | Wt 152.0 lb

## 2020-06-23 DIAGNOSIS — I2510 Atherosclerotic heart disease of native coronary artery without angina pectoris: Secondary | ICD-10-CM | POA: Diagnosis not present

## 2020-06-23 DIAGNOSIS — E118 Type 2 diabetes mellitus with unspecified complications: Secondary | ICD-10-CM

## 2020-06-23 DIAGNOSIS — I340 Nonrheumatic mitral (valve) insufficiency: Secondary | ICD-10-CM | POA: Diagnosis not present

## 2020-06-23 DIAGNOSIS — I10 Essential (primary) hypertension: Secondary | ICD-10-CM

## 2020-06-23 DIAGNOSIS — E785 Hyperlipidemia, unspecified: Secondary | ICD-10-CM

## 2020-06-23 DIAGNOSIS — I6523 Occlusion and stenosis of bilateral carotid arteries: Secondary | ICD-10-CM

## 2020-06-23 MED ORDER — METOPROLOL TARTRATE 25 MG PO TABS: 25.0000 mg | ORAL_TABLET | Freq: Two times a day (BID) | ORAL | 3 refills | Status: DC

## 2020-06-23 NOTE — Patient Instructions (Signed)
Medication Instructions:  STOP the Carvedilol STOP the Brilinta  START Metoprolol Tartrate 25 mg twice daily  *If you need a refill on your cardiac medications before your next appointment, please call your pharmacy*   Lab Work: None ordered If you have labs (blood work) drawn today and your tests are completely normal, you will receive your results only by: Marland Kitchen MyChart Message (if you have MyChart) OR . A paper copy in the mail If you have any lab test that is abnormal or we need to change your treatment, we will call you to review the results.   Testing/Procedures: Your physician has requested that you have an echocardiogram. Echocardiography is a painless test that uses sound waves to create images of your heart. It provides your doctor with information about the size and shape of your heart and how well your heart's chambers and valves are working. You may receive an ultrasound enhancing agent through an IV if needed to better visualize your heart during the echo.This procedure takes approximately one hour. There are no restrictions for this procedure. This will take place at the [address].   Follow-Up: At [facility], you and your health needs are our priority.  As part of our continuing mission to provide you with exceptional heart care, we have created designated Provider Care Teams.  These Care Teams include your primary Cardiologist (physician) and Advanced Practice Providers (APPs -  Physician Assistants and Nurse Practitioners) who all work together to provide you with the care you need, when you need it.  We recommend signing up for the patient portal called "MyChart".  Sign up information is provided on this After Visit Summary.  MyChart is used to connect with patients for Virtual Visits (Telemedicine).  Patients are able to view lab/test results, encounter notes, upcoming appointments, etc.  Non-urgent messages can be sent to your provider as well.   To  learn more about what you can do with MyChart, go to [email or web address].    Your next appointment:   12 month(s)  The format for your next appointment:   In Person  Provider:   You may see Minus Breeding, MD or one of the following Advanced Practice Providers on your designated Care Team:    Rosaria Ferries, PA-C  Jory Sims, DNP, ANP

## 2020-06-25 ENCOUNTER — Ambulatory Visit (INDEPENDENT_AMBULATORY_CARE_PROVIDER_SITE_OTHER): Payer: Medicare Other | Admitting: Podiatry

## 2020-06-25 ENCOUNTER — Other Ambulatory Visit: Payer: Self-pay

## 2020-06-25 ENCOUNTER — Encounter: Payer: Self-pay | Admitting: Podiatry

## 2020-06-25 DIAGNOSIS — B351 Tinea unguium: Secondary | ICD-10-CM

## 2020-06-25 DIAGNOSIS — M79675 Pain in left toe(s): Secondary | ICD-10-CM | POA: Diagnosis not present

## 2020-06-25 DIAGNOSIS — M2041 Other hammer toe(s) (acquired), right foot: Secondary | ICD-10-CM

## 2020-06-25 DIAGNOSIS — M2011 Hallux valgus (acquired), right foot: Secondary | ICD-10-CM

## 2020-06-25 DIAGNOSIS — E0821 Diabetes mellitus due to underlying condition with diabetic nephropathy: Secondary | ICD-10-CM

## 2020-06-25 DIAGNOSIS — M2042 Other hammer toe(s) (acquired), left foot: Secondary | ICD-10-CM

## 2020-06-25 DIAGNOSIS — M2012 Hallux valgus (acquired), left foot: Secondary | ICD-10-CM

## 2020-06-25 DIAGNOSIS — M79674 Pain in right toe(s): Secondary | ICD-10-CM | POA: Diagnosis not present

## 2020-06-28 NOTE — Progress Notes (Signed)
Subjective: Karen West presents today for at risk foot care with history of diabetic neuropathy and painful thick toenails that are difficult to trim. Pain interferes with ambulation. Aggravating factors include wearing enclosed shoe gear. Pain is relieved with periodic professional debridement..  Patient's blood sugar was 90 mg/dl this morning.   Deland Pretty, MD is patient's PCP. Last visit was 06/03/2020.   Allergies  Allergen Reactions  . Tramadol Nausea And Vomiting and Other (See Comments)    "got sick to my stomach and was throwing up blood; it put me in the hospital")  . Lisinopril Rash and Other (See Comments)    Renal failure   . Rosuvastatin     Other reaction(s): rhabdo. Other reaction(s): rhabdo.  [employer] [Methimazole] Itching and Rash    Review of Systems: Negative except as noted in the HPI.  Objective: There were no vitals filed for this visit.  Karen West is a pleasant 80 y.o. female in NAD. AAO X 3.  Vascular Examination: Capillary refill time to digits immediate b/l. Palpable DP pulse(s) b/l lower extremities Nonpalpable PT pulse(s) b/l lower extremities. Pedal hair absent. Lower extremity skin temperature gradient within normal limits.  Dermatological Examination: Pedal skin with normal turgor, texture and tone bilaterally. No open wounds bilaterally. No interdigital macerations bilaterally. Toenails 1-5 b/l elongated, discolored, dystrophic, thickened, crumbly with subungual debris and tenderness to dorsal palpation.  Musculoskeletal Examination: Normal muscle strength 5/5 to all lower extremity muscle groups bilaterally. No pain crepitus or joint limitation noted with ROM b/l. Hallux valgus with bunion deformity noted b/l lower extremities. Hammertoes noted to the 2-5 bilaterally.  Neurological Examination: Protective sensation intact 5/5 intact bilaterally with 10g monofilament b/l. Vibratory sensation intact b/l. Clonus negative  b/l.  Assessment: 1. Pain due to onychomycosis of toenails of both feet   2. Hallux valgus, acquired, bilateral   3. Acquired hammertoes of both feet   4. 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 -Diabetic foot examination performed on today's visit. -Patient to continue soft, supportive shoe gear daily. -Toenails 1-5 b/l were debrided in length and girth with sterile nail nippers and dremel without iatrogenic bleeding.  -Patient to report any pedal injuries to medical professional immediately. -Patient/POA to call should there be question/concern in the interim.  Return in about 4 months (around 10/25/2020).  Marzetta Board, DPM

## 2020-07-02 ENCOUNTER — Telehealth: Payer: Self-pay | Admitting: *Deleted

## 2020-07-02 NOTE — Telephone Encounter (Signed)
   [facility] Pre-operative Risk Assessment    Patient Name: Karen West  DOB: 1940-06-22  MRN: [id number]     Request for surgical clearance:  1. What type of surgery is being performed?  Endoscopic Ultrsound  2. When is this surgery scheduled? 07/16/20  3. What type of clearance is required (medical clearance vs. Pharmacy clearance to hold med vs. Both)? Medical  4. Are there any medications that need to be held prior to surgery and how long?Brilintawas stopped 06/23/20 ,Aspirin 81 mg  5. Practice name and name of physician performing surgery? [facility]  6. What is the office phone number? [phone]   7.   What is the office fax number? [phone]  8.   Anesthesia type (None, local, MAC, general) ? propofol   [name] 07/02/2020, 10:58 AM  _________________________________________________________________   (provider comments below)

## 2020-07-05 NOTE — Telephone Encounter (Signed)
    Karen West DOB:  03/05/1941  MRN:  [id number]   Primary Cardiologist: Minus Breeding, MD  Chart reviewed as part of pre-operative protocol coverage. Given past medical history and time since last visit, based on ACC/AHA guidelines, [name] would be at acceptable risk for the planned procedure without further cardiovascular testing. Recent treadmill stress test in Dec 2021 was low risk.   The patient was advised that if she develops new symptoms prior to surgery to contact our office to arrange for a follow-up visit, and she verbalized understanding.  I will route this recommendation to the requesting party via Epic fax function and remove from pre-op pool.  Please call with questions.  [place], [place] 07/05/2020, 11:49 AM

## 2020-07-12 ENCOUNTER — Other Ambulatory Visit: Payer: Self-pay

## 2020-07-12 ENCOUNTER — Encounter (HOSPITAL_COMMUNITY): Payer: Self-pay | Admitting: Gastroenterology

## 2020-07-13 ENCOUNTER — Other Ambulatory Visit (HOSPITAL_COMMUNITY)
Admission: RE | Admit: 2020-07-13 | Discharge: 2020-07-13 | Disposition: A | Discharge status: Home / Self Care | Payer: Medicare Other | Source: Ambulatory Visit | Attending: Gastroenterology | Admitting: Gastroenterology

## 2020-07-13 DIAGNOSIS — Z01812 Encounter for preprocedural laboratory examination: Secondary | ICD-10-CM | POA: Diagnosis not present

## 2020-07-13 DIAGNOSIS — Z20822 Contact with and (suspected) exposure to covid-19: Secondary | ICD-10-CM | POA: Insufficient documentation

## 2020-07-13 LAB — SARS CORONAVIRUS 2 (TAT 6-24 HRS): SARS Coronavirus 2: NEGATIVE

## 2020-07-16 ENCOUNTER — Ambulatory Visit (HOSPITAL_COMMUNITY): Payer: Medicare Other | Admitting: Anesthesiology

## 2020-07-16 ENCOUNTER — Encounter (HOSPITAL_COMMUNITY)
Admission: RE | Disposition: A | Discharge status: Home / Self Care | Payer: Self-pay | Source: Home / Self Care | Attending: Gastroenterology

## 2020-07-16 ENCOUNTER — Encounter (HOSPITAL_COMMUNITY): Payer: Self-pay | Admitting: Gastroenterology

## 2020-07-16 ENCOUNTER — Ambulatory Visit (HOSPITAL_COMMUNITY)
Admission: RE | Admit: 2020-07-16 | Discharge: 2020-07-16 | Disposition: A | Discharge status: Home / Self Care | Payer: Medicare Other | Attending: Gastroenterology | Admitting: Gastroenterology

## 2020-07-16 ENCOUNTER — Other Ambulatory Visit: Payer: Self-pay

## 2020-07-16 DIAGNOSIS — Z955 Presence of coronary angioplasty implant and graft: Secondary | ICD-10-CM | POA: Insufficient documentation

## 2020-07-16 DIAGNOSIS — Z9012 Acquired absence of left breast and nipple: Secondary | ICD-10-CM | POA: Diagnosis not present

## 2020-07-16 DIAGNOSIS — E1122 Type 2 diabetes mellitus with diabetic chronic kidney disease: Secondary | ICD-10-CM | POA: Diagnosis not present

## 2020-07-16 DIAGNOSIS — Z885 Allergy status to narcotic agent status: Secondary | ICD-10-CM | POA: Insufficient documentation

## 2020-07-16 DIAGNOSIS — Z888 Allergy status to other drugs, medicaments and biological substances status: Secondary | ICD-10-CM | POA: Diagnosis not present

## 2020-07-16 DIAGNOSIS — I129 Hypertensive chronic kidney disease with stage 1 through stage 4 chronic kidney disease, or unspecified chronic kidney disease: Secondary | ICD-10-CM | POA: Diagnosis not present

## 2020-07-16 DIAGNOSIS — N184 Chronic kidney disease, stage 4 (severe): Secondary | ICD-10-CM | POA: Diagnosis not present

## 2020-07-16 DIAGNOSIS — K862 Cyst of pancreas: Secondary | ICD-10-CM | POA: Insufficient documentation

## 2020-07-16 DIAGNOSIS — Z853 Personal history of malignant neoplasm of breast: Secondary | ICD-10-CM | POA: Insufficient documentation

## 2020-07-16 DIAGNOSIS — D631 Anemia in chronic kidney disease: Secondary | ICD-10-CM | POA: Diagnosis not present

## 2020-07-16 HISTORY — PX: UPPER ESOPHAGEAL ENDOSCOPIC ULTRASOUND (EUS): SHX6562

## 2020-07-16 HISTORY — PX: ESOPHAGOGASTRODUODENOSCOPY (EGD) WITH PROPOFOL: SHX5813

## 2020-07-16 HISTORY — PX: FINE NEEDLE ASPIRATION: SHX5430

## 2020-07-16 LAB — PANC CYST FLD ANLYS-PATHFNDR-TG

## 2020-07-16 LAB — GLUCOSE, CAPILLARY: Glucose-Capillary: 98 mg/dL (ref 70–99)

## 2020-07-16 SURGERY — UPPER ESOPHAGEAL ENDOSCOPIC ULTRASOUND (EUS)
Anesthesia: Monitor Anesthesia Care

## 2020-07-16 MED ORDER — PHENYLEPHRINE 40 MCG/ML (10ML) SYRINGE FOR IV PUSH (FOR BLOOD PRESSURE SUPPORT)
PREFILLED_SYRINGE | INTRAVENOUS | Status: DC | PRN
  Administered 2020-07-16: 80 ug via INTRAVENOUS
  Administered 2020-07-16: 120 ug via INTRAVENOUS

## 2020-07-16 MED ORDER — SODIUM CHLORIDE 0.9 % IV SOLN: INTRAVENOUS | Status: DC

## 2020-07-16 MED ORDER — CIPROFLOXACIN IN D5W 400 MG/200ML IV SOLN
INTRAVENOUS | Status: DC | PRN
  Administered 2020-07-16: 400 mg via INTRAVENOUS

## 2020-07-16 MED ORDER — EPHEDRINE SULFATE-NACL 50-0.9 MG/10ML-% IV SOSY
PREFILLED_SYRINGE | INTRAVENOUS | Status: DC | PRN
  Administered 2020-07-16: 5 mg via INTRAVENOUS

## 2020-07-16 MED ORDER — LACTATED RINGERS IV SOLN: Freq: Once | INTRAVENOUS | Status: AC

## 2020-07-16 MED ORDER — PROPOFOL 500 MG/50ML IV EMUL
INTRAVENOUS | Status: AC
  Filled 2020-07-16: qty 50

## 2020-07-16 MED ORDER — PROPOFOL 500 MG/50ML IV EMUL
INTRAVENOUS | Status: DC | PRN
  Administered 2020-07-16: 150 ug/kg/min via INTRAVENOUS

## 2020-07-16 MED ORDER — CIPROFLOXACIN IN D5W 400 MG/200ML IV SOLN
INTRAVENOUS | Status: AC
  Filled 2020-07-16: qty 200

## 2020-07-16 MED ORDER — LIDOCAINE 2% (20 MG/ML) 5 ML SYRINGE
INTRAMUSCULAR | Status: DC | PRN
  Administered 2020-07-16: 60 mg via INTRAVENOUS
  Administered 2020-07-16: 40 mg via INTRAVENOUS

## 2020-07-16 NOTE — Discharge Instructions (Signed)

## 2020-07-16 NOTE — Op Note (Signed)
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 Estimated Blood Loss:     Estimated blood loss: none. Procedure:                Pre-Anesthesia Assessment:                           - Prior to the procedure, a History and Physical                            was performed, and patient medications and                            allergies were reviewed. The patient's tolerance of                            previous anesthesia was also reviewed. The risks                            and benefits of the procedure and the sedation                            options and risks were discussed with the patient.                            All questions were answered, and informed consent                            was obtained. Prior Anticoagulants: The patient has                            taken no previous anticoagulant or antiplatelet                            agents. ASA Grade Assessment: II - A patient with                            mild systemic disease. After reviewing the risks  and benefits, the patient was deemed in                            satisfactory condition to undergo the procedure.                           - Sedation was administered by an anesthesia                            professional. Deep sedation was attained.                           After obtaining informed consent, the endoscope was                            passed under direct vision. Throughout the                             procedure, the patient's blood pressure, pulse, and                            oxygen saturations were monitored continuously. The                            [id number] ([id number] Olympus Linear EUS was                            introduced through the mouth, and advanced to the                            second part of duodenum. The upper EUS was                            technically difficult and complex. The patient                            tolerated the procedure well. Scope In: Scope Out: Findings:      ENDOSONOGRAPHIC FINDING: :      An anechoic lesion suggestive of a cyst was identified in the uncinate       process of the pancreas. It is not in obvious communication with the       pancreatic duct. The lesion measured 24 mm by 20 mm in maximal       cross-sectional diameter. There were 2 compartments thickly septated.       The outer wall of the lesion was thin. There was an associated mural       nodule in the wall. There was no internal debris within the fluid-filled       cavity. Diagnostic needle aspiration for fluid was performed. Color       Doppler imaging was utilized prior to needle puncture to confirm a lack       of significant vascular structures within the needle path. Three passes       were made with the 22 gauge needle using a transduodenal approach. A       stylet was used. The  amount of fluid collected was 0.5 mL. The fluid was       clear, white and slightly viscous.      In the head of the pancreas/uncinate process a multilobulated cyst was       identified. There were a total of three large compartments. Two of the       compartments were adjacent, but a third compartment, best viewed from       D2, was further separated. There did appear to be a communication with       the third compartment and the first two compartments. One compartment       exhibited a large 6 mm mural nodule. Multiple attempts to find a safe       windown to FNA  the cyst were performed, but there was an overlying       vessel. The vessel was not able to be bypassed. The second compartment       was successfully drained and the cyst collapsed. From D2 some fluid was       obtained from the thrid compartment, but the cyst did not collapse. The       fluid from this compartment was more viscous comparted to the other       compartment. This fluid was also cloudier. A clear ductal communication       was not readily identified with the main PD. The PD in the body measured       1 mm. Impression:               - A cystic lesion was seen in the uncinate process                            of the pancreas. Fine needle aspiration for fluid                            performed. Moderate Sedation:      Not Applicable - Patient had care per Anesthesia. Recommendation:           - Patient has a contact number available for                            emergencies. The signs and symptoms of potential                            delayed complications were discussed with the                            patient. Return to normal activities tomorrow.                            Written discharge instructions were provided to the                            patient.                           - Resume regular diet.                           - Await  cytology results and await tumor markers. Procedure Code(s):        --- Professional ---                           [phone], Esophagogastroduodenoscopy, flexible,                            transoral; with transendoscopic ultrasound-guided                            intramural or transmural fine needle                            aspiration/biopsy(s), (includes endoscopic                            ultrasound examination limited to the esophagus,                            stomach or duodenum, and adjacent structures) Diagnosis Code(s):        --- Professional ---                           Z56.3, Cyst of pancreas CPT copyright  2019 American [employer]. All rights reserved. The codes documented in this report are preliminary and upon coder review may  be revised to meet current compliance requirements. Carol Ada, MD Carol Ada, MD 07/16/2020 9:23:12 AM This report has been signed electronically. Number of Addenda: 0

## 2020-07-16 NOTE — Transfer of Care (Signed)
**Note De-Identified Karen Obfuscation** Immediate Anesthesia Transfer of Care Note  Patient: Karen West  Procedure(s) Performed: UPPER ESOPHAGEAL ENDOSCOPIC ULTRASOUND (EUS) (N/A ) FINE NEEDLE ASPIRATION (FNA) LINEAR (N/A )  Patient Location: Endoscopy Unit  Anesthesia Type:MAC  Level of Consciousness: drowsy  Airway & Oxygen Therapy: Patient Spontanous Breathing and Patient connected to face mask oxygen  Post-op Assessment: Report given to RN and Post -op Vital signs reviewed and stable  Post vital signs: Reviewed and stable  Last Vitals:  Vitals Value Taken Time  BP    Temp    Pulse 77 07/16/20 0908  Resp 16 07/16/20 0908  SpO2 97 % 07/16/20 0908  Vitals shown include unvalidated device data.  Last Pain:  Vitals:   07/16/20 0729  TempSrc: Oral  PainSc: 0-No pain         Complications: No complications documented.

## 2020-07-16 NOTE — Anesthesia Preprocedure Evaluation (Signed)
Anesthesia Evaluation  Patient identified by MRN, date of birth, ID band Patient awake    Reviewed: Allergy & Precautions, NPO status , Patient's Chart, lab work & pertinent test results  History of Anesthesia Complications Negative for: history of anesthetic complications  Airway Mallampati: III  TM Distance: >3 FB Neck ROM: Full    Dental  (+) Edentulous Upper, Missing, Dental Advisory Given,    Pulmonary neg shortness of breath, sleep apnea , neg COPD, neg recent URI,  Covid-19 Nucleic Acid Test Results Lab Results      Component                Value               Date                      SARSCOV2NAA              NEGATIVE            07/13/2020                [place]              NEGATIVE            03/01/2020                [place]              NEGATIVE            12/30/2019                [place]              NEGATIVE            08/22/2019                [place]              NEGATIVE            08/15/2019              breath sounds clear to auscultation       Cardiovascular hypertension, Pt. on medications (-) angina+ CAD, + Past MI and + Cardiac Stents  + dysrhythmias  Rhythm:Regular + Systolic murmurs 1. Left ventricular ejection fraction, by estimation, is 65 to 70%. The  left ventricle has normal function. The left ventricle has no regional  wall motion abnormalities. Left ventricular diastolic parameters are  consistent with Grade I diastolic  dysfunction (impaired relaxation). Elevated left ventricular end-diastolic  pressure.  2. Right ventricular systolic function is normal. The right ventricular  size is normal.  3. The mitral valve is normal in structure. Moderate mitral valve  regurgitation. No evidence of mitral stenosis.  4. Tricuspid valve regurgitation is mild to moderate.  5. The aortic valve is tricuspid. Aortic valve regurgitation is moderate.  Mild aortic valve stenosis. Aortic  valve mean gradient measures 7.0 mmHg.  6. The inferior vena cava is normal in size with greater than 50%  respiratory variability, suggesting right atrial pressure of 3 mmHg.   2021:   There was no ST segment deviation noted during stress.  The patient walked on a standard Bruce protocol treadmill tell for 3 minutes.  She achieved a peak heart rate of 130 which is 90% predicted maximal heart rate.  At peak exercise there were no ST or T wave changes to suggest ischemia. There was no QRS widening during exercise.  This  is interpreted as a negative stress test. She has only fair exercise capacity. There is no evidence of ischemia.    Prox LAD to Mid LAD lesion is 70% stenosed.  Prox LAD lesion is 40% stenosed.  Mid RCA to Dist RCA lesion is 80% stenosed.  A stent was successfully placed.  Dist RCA lesion is 95% stenosed.  Post intervention, there is a 0% residual stenosis.  Post intervention, there is a 0% residual stenosis.   There is severe mitral annular calcification.    The LAD has moderate diffuse calcification with 40 followed by 70% proximal stenosis prior to the takeoff of the first diagonal vessel with 20% mid stenosis.  Left circumflex vessel is tortuous and free of significant obstructive disease.  The RCA is a dominant vessel that had 80% diffuse stenosis in the region of the acute margin followed by 95% focal distal stenosis.  Transient 2-1/complete heart block with insertion of catheter into the LV with spontaneous resolution.  PCI performed via the femoral approach after transitioning from the radial approach which was associated with significant radial artery spasm.  Very difficult PCI with very difficult catheter backup with initial PTCA of the distal and acute margin stenoses with subsequent development of guiding catheter induced very proximal RCA dissection extending to the acute margin.  Utilizing telescope guidliner support from distal to near  ostial for Resolute Onyx DES stents were inserted with the most distal stent being a 2.5 x 18 mm stent followed by 2.5 x 22 mm stent, 2.5 x 38 mm stent and the most proximal stent a 2.75 x 18 mm Resolute Onyx stent with an excellent angiographic result with brisk TIMI-3 flow and no evidence for residual dissection.    Neuro/Psych  Headaches,  Neuromuscular disease negative psych ROS   GI/Hepatic Neg liver ROS, GERD  Medicated,  Endo/Other  diabetesHypothyroidism   Renal/GU Lab Results      Component                Value               Date                      CREATININE               1.32 (H)            08/26/2019           Lab Results      Component                Value               Date                      K                        4.1                 08/26/2019                Musculoskeletal  (+) Arthritis ,   Abdominal   Peds  Hematology  (+) Blood dyscrasia, anemia , Lab Results      Component                Value               Date  WBC                      7.1                 08/26/2019                HGB                      8.9 (L)             08/26/2019                HCT                      27.2 (L)            08/26/2019                MCV                      85.3                08/26/2019                PLT                      301                 08/26/2019              Anesthesia Other Findings   Reproductive/Obstetrics                             Anesthesia Physical Anesthesia Plan  ASA: III  Anesthesia Plan: MAC   Post-op Pain Management:    Induction: Intravenous  PONV Risk Score and Plan: 2 and Propofol infusion and Treatment may vary due to age or medical condition  Airway Management Planned: Nasal Cannula  Additional Equipment: None  Intra-op Plan:   Post-operative Plan:   Informed Consent: I have reviewed the patients History and Physical, chart, labs and discussed the procedure including  the risks, benefits and alternatives for the proposed anesthesia with the patient or authorized representative who has indicated his/her understanding and acceptance.     Dental advisory given  Plan Discussed with: CRNA  Anesthesia Plan Comments:         Anesthesia Quick Evaluation

## 2020-07-16 NOTE — H&P (Signed)
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  The cyst is in the uncinate process measuring 2.4 x 2.1 cm.  She is asymptomatic from the cyst.  Her CT scan on 04/07/2013 was negative for any cysts in her pancreas and there is no history of a pancreatitis.  She has a history of a NSCLC, Stage I, s/p VATS.  Past Medical History:  Diagnosis Date  . Atherosclerosis of aorta ([place])    a. 01/2017/03/2017 - noted on high res chest CTs.  . Breast cancer ([place])    a. Bilateral --> s/p left mastectomy  . Carotid artery disease ([place])    a. 06/2593 w/ 63-87% LICA stenosis and <56% RICA stenosis; b. 10/2015 Carotid U/S: < 50% BICA stenosis  . Chest pain    a. 09/2011 MV: EF 68%, no ischemia/infarct.  . Chronic anemia   . Chronic headaches    denies  . Coronary artery calcification seen on CT scan    a. 01/2017 High res CT: atherosclerotic calcification of the arterial vascularture, including severe involvement of the coronary arteries; b. 03/2017 CT Chest: coronary and Ao atheroscelrosis.  Marland Kitchen GERD (gastroesophageal reflux disease)   . History of echocardiogram    a. 09/2011 Echo: EF 55-60%, no rwma, triv AI, PASP 48mmHg.  Marland Kitchen Hyperlipidemia   . Hypertension   . Hyperthyroidism   . Left upper lobe pulmonary nodule    a. 02/2017 PET: slowing enlarging 1.7cm LUL nodule w/ low-grade metabolic activity; b. 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 05/2017 s/p VATS.  . Multinodular goiter    a. 02/2017 PET scan- Hypermetabolic nodule;  b. 07/1882 s/p thyroidectomy  . Myocardial infarction ([place]) 06/2019   NONSTEMI  . Obesity   . OSA on CPAP    cpap  . Osteoarthritis   . Personal history of radiation therapy 1999  . Right bundle branch block   . Type II or unspecified type diabetes mellitus without mention of complication, uncontrolled     Past  Surgical History:  Procedure Laterality Date  . BREAST BIOPSY  1993; 1995; 2000   left; right; left  . BREAST EXCISIONAL BIOPSY Right pt unsure  . CARDIAC CATHETERIZATION    . CORONARY STENT INTERVENTION N/A 07/03/2019   Procedure: CORONARY STENT INTERVENTION;  Surgeon: Karen Sine, MD;  Location: [facility];  Service: Cardiovascular;  Laterality: N/A;  RCA  . LEFT HEART CATH AND CORONARY ANGIOGRAPHY N/A 07/03/2019   Procedure: LEFT HEART CATH AND CORONARY ANGIOGRAPHY;  Surgeon: Karen Sine, MD;  Location: [facility];  Service: Cardiovascular;  Laterality: N/A;  . MASTECTOMY Left 2000   left  . THYROIDECTOMY  05/10/2017   VIDEO BRONCHOSCOPY WITH ENDOBRONCHIAL NAVIGATION (N/A)  . THYROIDECTOMY N/A 05/10/2017   Procedure: TOTAL THYROIDECTOMY;  Surgeon: Karen Gemma, MD;  Location: [facility];  Service: General;  Laterality: N/A;  . VIDEO ASSISTED THORACOSCOPY (VATS)/ LOBECTOMY Left 06/04/2017   Procedure: VIDEO ASSISTED THORACOSCOPY (VATS)/LEFT UPPER LOBECTOMY;  Surgeon: Karen Nakayama, MD;  Location: [facility];  Service: Thoracic;  Laterality: Left;  Marland Kitchen VIDEO BRONCHOSCOPY WITH ENDOBRONCHIAL NAVIGATION N/A 05/10/2017   Procedure: VIDEO BRONCHOSCOPY WITH ENDOBRONCHIAL NAVIGATION;  Surgeon: Karen Nakayama, MD;  Location: [facility];  Service: Thoracic;  Laterality: N/A;  . VIDEO BRONCHOSCOPY WITH ENDOBRONCHIAL ULTRASOUND N/A 06/04/2017   Procedure: VIDEO BRONCHOSCOPY WITH ENDOBRONCHIAL  ULTRASOUND;  Surgeon: Karen Nakayama, MD;  [facility];  Service: Thoracic;  Laterality: N/A;    Family History  Problem Relation Age of Onset  . Diabetes Brother        x3  . Hypertension Brother        x3  . Diabetes Brother   . Stroke Brother   . Heart attack Mother 28       Mother Died of MI age 61  . Diabetes Sister   . Stroke Sister     Social History:  reports that she has never smoked. She has never used smokeless tobacco. She reports current alcohol use. She  reports that she does not use drugs.  Allergies:  Allergies  Allergen Reactions  . Tramadol Nausea And Vomiting and Other (See Comments)    "got sick to my stomach and was throwing up blood; it put me in the hospital")  . Lisinopril Rash and Other (See Comments)    Renal failure   . Rosuvastatin     Other reaction(s): rhabdo. Other reaction(s): rhabdo.  Karen West [Methimazole] Itching and Rash    Medications:  Scheduled:  Continuous: . sodium chloride      No results found for this or any previous visit (from the past 24 hour(s)).   No results found.  ROS:  As stated above in the HPI otherwise negative.  Height 5\' 2"  (1.575 m), weight 67.1 kg.    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  Kayvan Hoefling D 07/16/2020, 7:29 AM

## 2020-07-19 ENCOUNTER — Encounter (HOSPITAL_COMMUNITY): Payer: Self-pay | Admitting: Gastroenterology

## 2020-07-20 NOTE — Anesthesia Postprocedure Evaluation (Signed)
Anesthesia Post Note  Patient: BRITTNI HULT  Procedure(s) Performed: UPPER ESOPHAGEAL ENDOSCOPIC ULTRASOUND (EUS) (N/A ) FINE NEEDLE ASPIRATION (FNA) LINEAR (N/A ) ESOPHAGOGASTRODUODENOSCOPY (EGD) WITH PROPOFOL (N/A )     Patient location during evaluation: Endoscopy Anesthesia Type: MAC Level of consciousness: awake and alert Pain management: pain level controlled Vital Signs Assessment: post-procedure vital signs reviewed and stable Respiratory status: spontaneous breathing, nonlabored ventilation, respiratory function stable and patient connected to nasal cannula oxygen Cardiovascular status: stable and blood pressure returned to baseline Postop Assessment: no apparent nausea or vomiting Anesthetic complications: no   No complications documented.  Last Vitals:  Vitals:   07/16/20 0930 07/16/20 0940  BP: (!) 136/45 (!) 126/48  Pulse: 70 64  Resp: (!) 21 17  Temp:    SpO2: 92% 93%    Last Pain:  Vitals:   07/19/20 1310  TempSrc:   PainSc: 0-No pain                 Aloysuis Ribaudo

## 2020-07-22 DIAGNOSIS — C3490 Malignant neoplasm of unspecified part of unspecified bronchus or lung: Secondary | ICD-10-CM | POA: Diagnosis not present

## 2020-07-22 DIAGNOSIS — N189 Chronic kidney disease, unspecified: Secondary | ICD-10-CM | POA: Diagnosis not present

## 2020-07-22 DIAGNOSIS — D631 Anemia in chronic kidney disease: Secondary | ICD-10-CM | POA: Diagnosis not present

## 2020-07-22 DIAGNOSIS — E1122 Type 2 diabetes mellitus with diabetic chronic kidney disease: Secondary | ICD-10-CM | POA: Diagnosis not present

## 2020-07-22 DIAGNOSIS — E039 Hypothyroidism, unspecified: Secondary | ICD-10-CM | POA: Diagnosis not present

## 2020-07-22 DIAGNOSIS — N183 Chronic kidney disease, stage 3 unspecified: Secondary | ICD-10-CM | POA: Diagnosis not present

## 2020-07-22 DIAGNOSIS — N390 Urinary tract infection, site not specified: Secondary | ICD-10-CM | POA: Diagnosis not present

## 2020-07-22 DIAGNOSIS — R809 Proteinuria, unspecified: Secondary | ICD-10-CM | POA: Diagnosis not present

## 2020-07-22 DIAGNOSIS — N179 Acute kidney failure, unspecified: Secondary | ICD-10-CM | POA: Diagnosis not present

## 2020-07-22 DIAGNOSIS — I129 Hypertensive chronic kidney disease with stage 1 through stage 4 chronic kidney disease, or unspecified chronic kidney disease: Secondary | ICD-10-CM | POA: Diagnosis not present

## 2020-07-22 DIAGNOSIS — C50919 Malignant neoplasm of unspecified site of unspecified female breast: Secondary | ICD-10-CM | POA: Diagnosis not present

## 2020-07-27 ENCOUNTER — Ambulatory Visit (HOSPITAL_COMMUNITY)
Admission: RE | Admit: 2020-07-27 | Discharge: 2020-07-27 | Disposition: A | Discharge status: Home / Self Care | Payer: Medicare Other | Source: Ambulatory Visit | Attending: Internal Medicine | Admitting: Internal Medicine

## 2020-07-27 ENCOUNTER — Other Ambulatory Visit: Payer: Self-pay

## 2020-07-27 ENCOUNTER — Inpatient Hospital Stay: Payer: Medicare Other | Attending: Internal Medicine

## 2020-07-27 DIAGNOSIS — C3412 Malignant neoplasm of upper lobe, left bronchus or lung: Secondary | ICD-10-CM | POA: Insufficient documentation

## 2020-07-27 DIAGNOSIS — I2510 Atherosclerotic heart disease of native coronary artery without angina pectoris: Secondary | ICD-10-CM | POA: Insufficient documentation

## 2020-07-27 DIAGNOSIS — C3490 Malignant neoplasm of unspecified part of unspecified bronchus or lung: Secondary | ICD-10-CM | POA: Diagnosis not present

## 2020-07-27 DIAGNOSIS — Z853 Personal history of malignant neoplasm of breast: Secondary | ICD-10-CM | POA: Diagnosis not present

## 2020-07-27 DIAGNOSIS — Z9012 Acquired absence of left breast and nipple: Secondary | ICD-10-CM | POA: Insufficient documentation

## 2020-07-27 DIAGNOSIS — Z902 Acquired absence of lung [part of]: Secondary | ICD-10-CM | POA: Insufficient documentation

## 2020-07-27 DIAGNOSIS — I10 Essential (primary) hypertension: Secondary | ICD-10-CM | POA: Insufficient documentation

## 2020-07-27 DIAGNOSIS — I700 Atherosclerosis of aorta: Secondary | ICD-10-CM | POA: Insufficient documentation

## 2020-07-27 DIAGNOSIS — R0602 Shortness of breath: Secondary | ICD-10-CM | POA: Insufficient documentation

## 2020-07-27 DIAGNOSIS — Z79899 Other long term (current) drug therapy: Secondary | ICD-10-CM | POA: Insufficient documentation

## 2020-07-27 DIAGNOSIS — Z885 Allergy status to narcotic agent status: Secondary | ICD-10-CM | POA: Insufficient documentation

## 2020-07-27 DIAGNOSIS — Z888 Allergy status to other drugs, medicaments and biological substances status: Secondary | ICD-10-CM | POA: Insufficient documentation

## 2020-07-27 DIAGNOSIS — J984 Other disorders of lung: Secondary | ICD-10-CM | POA: Diagnosis not present

## 2020-07-27 DIAGNOSIS — R079 Chest pain, unspecified: Secondary | ICD-10-CM | POA: Insufficient documentation

## 2020-07-27 DIAGNOSIS — E890 Postprocedural hypothyroidism: Secondary | ICD-10-CM | POA: Diagnosis not present

## 2020-07-27 LAB — CMP (CANCER CENTER ONLY)
ALT: 6 U/L (ref 0–44)
AST: 14 U/L — ABNORMAL LOW (ref 15–41)
Albumin: 3.3 g/dL — ABNORMAL LOW (ref 3.5–5.0)
Alkaline Phosphatase: 75 U/L (ref 38–126)
Anion gap: 11 (ref 5–15)
BUN: 32 mg/dL — ABNORMAL HIGH (ref 8–23)
CO2: 22 mmol/L (ref 22–32)
Calcium: 8.7 mg/dL — ABNORMAL LOW (ref 8.9–10.3)
Chloride: 106 mmol/L (ref 98–111)
Creatinine: 1.33 mg/dL — ABNORMAL HIGH (ref 0.44–1.00)
GFR, Estimated: 41 mL/min — ABNORMAL LOW (ref >60)
Glucose, Bld: 95 mg/dL (ref 70–99)
Potassium: 4.3 mmol/L (ref 3.5–5.1)
Sodium: 139 mmol/L (ref 135–145)
Total Bilirubin: 0.3 mg/dL (ref 0.3–1.2)
Total Protein: 7.3 g/dL (ref 6.5–8.1)

## 2020-07-27 LAB — CBC WITH DIFFERENTIAL (CANCER CENTER ONLY)
Abs Immature Granulocytes: 0.03 10*3/uL (ref 0.00–0.07)
Basophils Absolute: 0 10*3/uL (ref 0.0–0.1)
Basophils Relative: 0 %
Eosinophils Absolute: 0.1 10*3/uL (ref 0.0–0.5)
Eosinophils Relative: 1 %
HCT: 31 % — ABNORMAL LOW (ref 36.0–46.0)
Hemoglobin: 10 g/dL — ABNORMAL LOW (ref 12.0–15.0)
Immature Granulocytes: 0 %
Lymphocytes Relative: 15 %
Lymphs Abs: 1.2 10*3/uL (ref 0.7–4.0)
MCH: 27.7 pg (ref 26.0–34.0)
MCHC: 32.3 g/dL (ref 30.0–36.0)
MCV: 85.9 fL (ref 80.0–100.0)
Monocytes Absolute: 0.4 10*3/uL (ref 0.1–1.0)
Monocytes Relative: 5 %
Neutro Abs: 6.2 10*3/uL (ref 1.7–7.7)
Neutrophils Relative %: 79 %
Platelet Count: 373 10*3/uL (ref 150–400)
RBC: 3.61 MIL/uL — ABNORMAL LOW (ref 3.87–5.11)
RDW: 15.6 % — ABNORMAL HIGH (ref 11.5–15.5)
WBC Count: 8 10*3/uL (ref 4.0–10.5)
nRBC: 0 % (ref 0.0–0.2)

## 2020-07-29 ENCOUNTER — Encounter: Payer: Self-pay | Admitting: Internal Medicine

## 2020-07-29 ENCOUNTER — Other Ambulatory Visit: Payer: Self-pay

## 2020-07-29 ENCOUNTER — Inpatient Hospital Stay (HOSPITAL_BASED_OUTPATIENT_CLINIC_OR_DEPARTMENT_OTHER): Payer: Medicare Other | Admitting: Internal Medicine

## 2020-07-29 VITALS — BP 152/55 | HR 61 | Temp 97.2°F | Resp 20 | Ht 62.0 in | Wt 153.3 lb

## 2020-07-29 DIAGNOSIS — Z9012 Acquired absence of left breast and nipple: Secondary | ICD-10-CM | POA: Diagnosis not present

## 2020-07-29 DIAGNOSIS — I6523 Occlusion and stenosis of bilateral carotid arteries: Secondary | ICD-10-CM

## 2020-07-29 DIAGNOSIS — R0602 Shortness of breath: Secondary | ICD-10-CM | POA: Diagnosis not present

## 2020-07-29 DIAGNOSIS — Z902 Acquired absence of lung [part of]: Secondary | ICD-10-CM | POA: Diagnosis not present

## 2020-07-29 DIAGNOSIS — C3412 Malignant neoplasm of upper lobe, left bronchus or lung: Secondary | ICD-10-CM | POA: Diagnosis not present

## 2020-07-29 DIAGNOSIS — C3492 Malignant neoplasm of unspecified part of left bronchus or lung: Secondary | ICD-10-CM | POA: Diagnosis not present

## 2020-07-29 DIAGNOSIS — I700 Atherosclerosis of aorta: Secondary | ICD-10-CM | POA: Diagnosis not present

## 2020-07-29 DIAGNOSIS — I2510 Atherosclerotic heart disease of native coronary artery without angina pectoris: Secondary | ICD-10-CM | POA: Diagnosis not present

## 2020-07-29 DIAGNOSIS — I10 Essential (primary) hypertension: Secondary | ICD-10-CM

## 2020-07-29 DIAGNOSIS — C3490 Malignant neoplasm of unspecified part of unspecified bronchus or lung: Secondary | ICD-10-CM

## 2020-07-29 DIAGNOSIS — Z79899 Other long term (current) drug therapy: Secondary | ICD-10-CM | POA: Diagnosis not present

## 2020-07-29 DIAGNOSIS — R079 Chest pain, unspecified: Secondary | ICD-10-CM | POA: Diagnosis not present

## 2020-07-29 DIAGNOSIS — Z888 Allergy status to other drugs, medicaments and biological substances status: Secondary | ICD-10-CM | POA: Diagnosis not present

## 2020-07-29 DIAGNOSIS — Z885 Allergy status to narcotic agent status: Secondary | ICD-10-CM | POA: Diagnosis not present

## 2020-07-29 NOTE — Progress Notes (Signed)
[facility] [phone]   Fax:([phone]) [place], MD [address] [place] [address]  DIAGNOSIS:  stage IA (T1 a, N0, M0) non-small cell lung cancer, adenocarcinoma presented with left upper lobe lung nodule.  PRIOR THERAPY: status post left upper lobectomy with lymph node dissection under the care of Dr. Roxan Hockey on June 04, 2017.  CURRENT THERAPY: Observation.  INTERVAL HISTORY: Karen West 80 y.o. female returns to the clinic today for follow-up visit.  The patient is feeling fine today with no concerning complaints except for occasional left-sided chest pain and shortness of breath with exertion but no significant cough or hemoptysis.  She denied having any fever or chills.  She has no nausea, vomiting, diarrhea or constipation.  She has no headache or visual changes.  She has no weight loss or night sweats.  She is here today for evaluation with repeat CT scan of the chest for restaging of her disease.  MEDICAL HISTORY: Past Medical History:  Diagnosis Date  . Atherosclerosis of aorta ([place])    a. 01/2017/03/2017 - noted on high res chest CTs.  . Breast cancer ([place])    a. Bilateral --> s/p left mastectomy  . Carotid artery disease ([place])    a. 03/4429 w/ 54-00% LICA stenosis and <86% RICA stenosis; b. 10/2015 Carotid U/S: < 50% BICA stenosis  . Chest pain    a. 09/2011 MV: EF 68%, no ischemia/infarct.  . Chronic anemia   . Chronic headaches    denies  . Coronary artery calcification seen on CT scan    a. 01/2017 High res CT: atherosclerotic calcification of the arterial vascularture, including severe involvement of the coronary arteries; b. 03/2017 CT Chest: coronary and Ao atheroscelrosis.  Marland Kitchen GERD (gastroesophageal reflux disease)   . History of echocardiogram    a. 09/2011 Echo: EF 55-60%, no rwma, triv AI, PASP 45mmHg.  Marland Kitchen Hyperlipidemia   . Hypertension   . Hyperthyroidism   . Left  upper lobe pulmonary nodule    a. 02/2017 PET: slowing enlarging 1.7cm LUL nodule w/ low-grade metabolic activity; b. 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 05/2017 s/p VATS.  . Multinodular goiter    a. 02/2017 PET scan- Hypermetabolic nodule;  b. 0/9326 s/p thyroidectomy  . Myocardial infarction ([place]) 06/2019   NONSTEMI  . Obesity   . OSA on CPAP    cpap  . Osteoarthritis   . Personal history of radiation therapy 1999  . Right bundle branch block   . Type II or unspecified type diabetes mellitus without mention of complication, uncontrolled     ALLERGIES:  is allergic to tramadol, lisinopril, rosuvastatin, and tapazole [methimazole].  MEDICATIONS:  Current Outpatient Medications  Medication Sig Dispense Refill  . acetaminophen (TYLENOL) 500 MG tablet Take 500-1,000 mg by mouth every 6 (six) hours as needed for moderate pain or headache.     . Alirocumab (PRALUENT) 150 MG/ML SOAJ Inject 150 mg into the skin every 14 (fourteen) days. 1 mL 0  . amLODipine (NORVASC) 10 MG tablet Take 10 mg by mouth daily.    Marland Kitchen aspirin EC 81 MG tablet Take 81 mg by mouth daily. Swallow whole.    . carvedilol (COREG) 12.5 MG tablet Take 12.5 mg by mouth 2 (two) times daily with a meal.    . Cholecalciferol (VITAMIN D3) 50 MCG (2000 UT) TABS Take 2,000 Units by mouth daily.    . Empagliflozin-Linaglip-Metform (TRIJARDY XR)  12.5-2.07-998 MG TB24 Take 1 tablet by mouth in the morning and at bedtime.    Marland Kitchen ezetimibe (ZETIA) 10 MG tablet Take 10 mg by mouth every evening.    . hydrocortisone cream 1 % Apply 1 application topically 2 (two) times daily as needed for itching.     . levothyroxine (SYNTHROID, LEVOTHROID) 88 MCG tablet Take 88 mcg by mouth daily before breakfast.   5  . nitroGLYCERIN (NITROSTAT) 0.4 MG SL tablet Place 1 tablet (0.4 mg total) under the tongue every 5 (five) minutes as needed for chest pain. 25 tablet 3  . Omega-3 Fatty Acids (FISH OIL) 1200 MG CAPS Take 1,200 mg by mouth 2 (two)  times daily.     Marland Kitchen omeprazole (PRILOSEC) 20 MG capsule Take 20 mg by mouth 2 (two) times daily.     . vitamin B-12 (CYANOCOBALAMIN) 1000 MCG tablet Take 1,000 mcg by mouth daily.     No current facility-administered medications for this visit.    SURGICAL HISTORY:  Past Surgical History:  Procedure Laterality Date  . BREAST BIOPSY  1993; 1995; 2000   left; right; left  . BREAST EXCISIONAL BIOPSY Right pt unsure  . CARDIAC CATHETERIZATION    . CORONARY STENT INTERVENTION N/A 07/03/2019   Procedure: CORONARY STENT INTERVENTION;  Surgeon: Troy Sine, MD;  Location: [facility];  Service: Cardiovascular;  Laterality: N/A;  RCA  . ESOPHAGOGASTRODUODENOSCOPY (EGD) WITH PROPOFOL N/A 07/16/2020   Procedure: ESOPHAGOGASTRODUODENOSCOPY (EGD) WITH PROPOFOL;  Surgeon: Carol Ada, MD;  Location: [facility];  Service: Endoscopy;  Laterality: N/A;  . FINE NEEDLE ASPIRATION N/A 07/16/2020   Procedure: FINE NEEDLE ASPIRATION (FNA) LINEAR;  Surgeon: Carol Ada, MD;  Location: [facility];  Service: Endoscopy;  Laterality: N/A;  . LEFT HEART CATH AND CORONARY ANGIOGRAPHY N/A 07/03/2019   Procedure: LEFT HEART CATH AND CORONARY ANGIOGRAPHY;  Surgeon: Troy Sine, MD;  Location: [facility];  Service: Cardiovascular;  Laterality: N/A;  . MASTECTOMY Left 2000   left  . THYROIDECTOMY  05/10/2017   VIDEO BRONCHOSCOPY WITH ENDOBRONCHIAL NAVIGATION (N/A)  . THYROIDECTOMY N/A 05/10/2017   Procedure: TOTAL THYROIDECTOMY;  Surgeon: Armandina Gemma, MD;  Location: [facility];  Service: General;  Laterality: N/A;  . UPPER ESOPHAGEAL ENDOSCOPIC ULTRASOUND (EUS) N/A 07/16/2020   Procedure: UPPER ESOPHAGEAL ENDOSCOPIC ULTRASOUND (EUS);  Surgeon: Carol Ada, MD;  Location: [facility];  Service: Endoscopy;  Laterality: N/A;  . VIDEO ASSISTED THORACOSCOPY (VATS)/ LOBECTOMY Left 06/04/2017   Procedure: VIDEO ASSISTED THORACOSCOPY (VATS)/LEFT UPPER LOBECTOMY;  Surgeon: [name], MD;   Location: [facility];  Service: Thoracic;  Laterality: Left;  Marland Kitchen VIDEO BRONCHOSCOPY WITH ENDOBRONCHIAL NAVIGATION N/A 05/10/2017   Procedure: VIDEO BRONCHOSCOPY WITH ENDOBRONCHIAL NAVIGATION;  Surgeon: [name], MD;  Location: [facility];  Service: Thoracic;  Laterality: N/A;  . VIDEO BRONCHOSCOPY WITH ENDOBRONCHIAL ULTRASOUND N/A 06/04/2017   Procedure: VIDEO BRONCHOSCOPY WITH ENDOBRONCHIAL ULTRASOUND;  Surgeon: [name], MD;  Location: [facility];  Service: Thoracic;  Laterality: N/A;    REVIEW OF SYSTEMS:  A comprehensive review of systems was negative except for: Respiratory: positive for pleurisy/chest pain   PHYSICAL EXAMINATION: General appearance: alert, cooperative and no distress Head: Normocephalic, without obvious abnormality, atraumatic Neck: no adenopathy, no JVD, supple, symmetrical, trachea midline and thyroid not enlarged, symmetric, no tenderness/mass/nodules Lymph nodes: Cervical, supraclavicular, and axillary nodes normal. Resp: clear to auscultation bilaterally Back: symmetric, no curvature. ROM normal. No CVA tenderness. Cardio: regular rate and rhythm, S1, S2  normal, no murmur, click, rub or gallop GI: soft, non-tender; bowel sounds normal; no masses,  no organomegaly Extremities: extremities normal, atraumatic, no cyanosis or edema  ECOG PERFORMANCE STATUS: 1 - Symptomatic but completely ambulatory  Blood pressure (!) 152/55, pulse 61, temperature (!) 97.2 F (36.2 C), temperature source Tympanic, resp. rate 20, height 5\' 2"  (1.575 m), weight 153 lb 4.8 oz (69.5 kg), SpO2 99 %.  LABORATORY DATA: Lab Results  Component Value Date   WBC 8.0 07/27/2020   HGB 10.0 (L) 07/27/2020   HCT 31.0 (L) 07/27/2020   MCV 85.9 07/27/2020   PLT 373 07/27/2020      Chemistry      Component Value Date/Time   NA 139 07/27/2020 0930   NA 138 07/21/2019 1033   K 4.3 07/27/2020 0930   CL 106 07/27/2020 0930   CO2 22 07/27/2020 0930   BUN 32 (H) 07/27/2020 0930    BUN 25 07/21/2019 1033   CREATININE 1.33 (H) 07/27/2020 0930   CREATININE 1.16 (H) 09/11/2017 1241      Component Value Date/Time   CALCIUM 8.7 (L) 07/27/2020 0930   ALKPHOS 75 07/27/2020 0930   AST 14 (L) 07/27/2020 0930   ALT 6 07/27/2020 0930   BILITOT 0.3 07/27/2020 0930       RADIOGRAPHIC STUDIES: CT Chest Wo Contrast  Result Date: 07/28/2020 CLINICAL DATA:  Restaging non-small cell lung cancer. Remote history of breast cancer status post left mastectomy and right lumpectomy. EXAM: CT CHEST WITHOUT CONTRAST TECHNIQUE: Multidetector CT imaging of the chest was performed following the standard protocol without IV contrast. COMPARISON:  07/28/2019. FINDINGS: Cardiovascular: Heart size is within normal limits. Small amount of pericardial fluid is again noted anteriorly. Calcifications of the aortic valve and mitral annulus. Aortic atherosclerosis. Coronary artery calcifications. Mediastinum/Nodes: Thyroidectomy. The trachea appears patent and is midline. Normal appearance of the esophagus. Bilateral axillary node dissection. No enlarged axillary, supraclavicular, or mediastinal adenopathy. The hilar lymph nodes are suboptimally evaluated due to lack of IV contrast. Lungs/Pleura: Status post left upper lobectomy. Regional area of ground-glass attenuation within the left upper lung is new from previous exam, nonspecific, image 27/5. Calcified granuloma along the superior aspect of the right major fissure. No suspicious lung nodule. Upper Abdomen: No acute findings within the imaged portions of the upper abdomen. Bilateral lobular appearing kidneys with several the hyperdense left kidney lesions which are incompletely characterized without IV contrast. Similar to the previous exam is the 3.3 cm hyperdense lesion arising off the medial cortex of the upper pole of left kidney, image 126/2. Unchanged in size from previous exam. Additionally, there is a stable hyperdense lesion arising from the anterior  cortex of the upper pole of left kidney measuring 1 cm, image 124/2. Also incompletely characterized without IV contrast. Musculoskeletal: No chest wall mass or suspicious bone lesions identified. Postoperative changes from left mastectomy. IMPRESSION: 1. Stable CT of the chest status post left upper lobectomy. No specific findings identified to suggest residual or recurrence of tumor or metastatic disease. 2. Within the left upper lobe there is a new geographic area of ground-glass attenuation which is nonspecific but favored to represent sequelae of interval inflammation or infection. 3. There are 2 hyperdense lesions arising from the upper pole of left kidney. Although stable compared with previous exam these are incompletely characterized without IV contrast. 4. Aortic atherosclerosis and coronary artery calcifications. Aortic Atherosclerosis (ICD10-I70.0). Electronically Signed   By: Kerby Moors M.D.   On: 07/28/2020 08:59  ASSESSMENT AND PLAN: This is a very pleasant 80 years old African-American female with a stage Ia non-small cell lung cancer, adenocarcinoma status post left upper lobectomy with lymph node dissection in March 2019 under the care of Dr. Roxan Hockey. The patient has been on observation since 2019 and she is feeling fine with no concerning complaints. She had repeat CT scan of the chest performed recently.  I personally and independently reviewed the scans and discussed the results with the patient today. Her scan showed no concerning findings for disease recurrence or metastasis. I recommended for her to continue on observation with repeat CT scan of the chest in 6 months. She was advised to call immediately if she has any concerning symptoms in the interval. The patient voices understanding of current disease status and treatment options and is in agreement with the current care plan. All questions were answered. The patient knows to call the clinic with any problems,  questions or concerns. We can certainly see the patient much sooner if necessary.  Disclaimer: This note was dictated with voice recognition software. Similar sounding words can inadvertently be transcribed and may not be corrected upon review.

## 2020-08-02 ENCOUNTER — Other Ambulatory Visit: Payer: Self-pay | Admitting: Internal Medicine

## 2020-08-02 ENCOUNTER — Other Ambulatory Visit: Payer: Self-pay

## 2020-08-02 ENCOUNTER — Ambulatory Visit (HOSPITAL_COMMUNITY): Payer: Medicare Other | Attending: Cardiology

## 2020-08-02 DIAGNOSIS — I340 Nonrheumatic mitral (valve) insufficiency: Secondary | ICD-10-CM | POA: Diagnosis not present

## 2020-08-02 DIAGNOSIS — Z1231 Encounter for screening mammogram for malignant neoplasm of breast: Secondary | ICD-10-CM

## 2020-08-02 DIAGNOSIS — K862 Cyst of pancreas: Secondary | ICD-10-CM | POA: Diagnosis not present

## 2020-08-02 DIAGNOSIS — R197 Diarrhea, unspecified: Secondary | ICD-10-CM | POA: Diagnosis not present

## 2020-08-02 LAB — ECHOCARDIOGRAM COMPLETE
AR max vel: 1.39 cm2
AV Area VTI: 1.55 cm2
AV Area mean vel: 1.41 cm2
AV Mean grad: 7.7 mmHg
AV Peak grad: 12.8 mmHg
Ao pk vel: 1.79 m/s
Area-P 1/2: 3.37 cm2
MV M vel: 5.05 m/s
MV Peak grad: 102 mmHg
P 1/2 time: 441 msec
S' Lateral: 2.1 cm

## 2020-08-03 DIAGNOSIS — K862 Cyst of pancreas: Secondary | ICD-10-CM | POA: Diagnosis not present

## 2020-08-09 DIAGNOSIS — E0590 Thyrotoxicosis, unspecified without thyrotoxic crisis or storm: Secondary | ICD-10-CM | POA: Diagnosis not present

## 2020-08-09 DIAGNOSIS — E782 Mixed hyperlipidemia: Secondary | ICD-10-CM | POA: Diagnosis not present

## 2020-08-09 DIAGNOSIS — I10 Essential (primary) hypertension: Secondary | ICD-10-CM | POA: Diagnosis not present

## 2020-08-09 DIAGNOSIS — Z7982 Long term (current) use of aspirin: Secondary | ICD-10-CM | POA: Diagnosis not present

## 2020-08-09 DIAGNOSIS — Z8739 Personal history of other diseases of the musculoskeletal system and connective tissue: Secondary | ICD-10-CM | POA: Diagnosis not present

## 2020-08-09 DIAGNOSIS — E1121 Type 2 diabetes mellitus with diabetic nephropathy: Secondary | ICD-10-CM | POA: Diagnosis not present

## 2020-08-11 DIAGNOSIS — Z8739 Personal history of other diseases of the musculoskeletal system and connective tissue: Secondary | ICD-10-CM | POA: Diagnosis not present

## 2020-08-11 DIAGNOSIS — E1121 Type 2 diabetes mellitus with diabetic nephropathy: Secondary | ICD-10-CM | POA: Diagnosis not present

## 2020-08-11 DIAGNOSIS — R195 Other fecal abnormalities: Secondary | ICD-10-CM | POA: Diagnosis not present

## 2020-08-11 DIAGNOSIS — E0590 Thyrotoxicosis, unspecified without thyrotoxic crisis or storm: Secondary | ICD-10-CM | POA: Diagnosis not present

## 2020-08-11 DIAGNOSIS — I252 Old myocardial infarction: Secondary | ICD-10-CM | POA: Diagnosis not present

## 2020-08-11 DIAGNOSIS — Z7982 Long term (current) use of aspirin: Secondary | ICD-10-CM | POA: Diagnosis not present

## 2020-08-11 DIAGNOSIS — E782 Mixed hyperlipidemia: Secondary | ICD-10-CM | POA: Diagnosis not present

## 2020-08-11 DIAGNOSIS — I10 Essential (primary) hypertension: Secondary | ICD-10-CM | POA: Diagnosis not present

## 2020-08-18 ENCOUNTER — Ambulatory Visit: Payer: Medicare Other | Attending: Internal Medicine

## 2020-08-18 DIAGNOSIS — Z23 Encounter for immunization: Secondary | ICD-10-CM

## 2020-08-18 NOTE — Progress Notes (Signed)
   Covid-19 Vaccination Clinic  Name:  Karen West    MRN: [id number] DOB: 21-Jun-1940  08/18/2020  Karen West was observed post Covid-19 immunization for 15 minutes without incident. She was provided with Vaccine Information Sheet and instruction to access the V-Safe system.   Karen West was instructed to call 911 with any severe reactions post vaccine: Marland Kitchen Difficulty breathing  . Swelling of face and throat  . A fast heartbeat  . A bad rash all over body  . Dizziness and weakness   Immunizations Administered    Name Date Dose VIS Date Route   Moderna Covid-19 Booster Vaccine 08/18/2020 10:54 AM 0.25 mL 01/07/2020 Intramuscular   Manufacturer: Moderna   Lot: [id number]   [place]: [phone]

## 2020-08-20 ENCOUNTER — Other Ambulatory Visit (HOSPITAL_COMMUNITY): Payer: Self-pay

## 2020-08-20 MED ORDER — COVID-19 MRNA VACC (MODERNA) 100 MCG/0.5ML IM SUSP
INTRAMUSCULAR | 0 refills | Status: DC
  Filled 2020-08-20: qty 0.5, 17d supply, fill #0

## 2020-08-25 ENCOUNTER — Other Ambulatory Visit (HOSPITAL_COMMUNITY): Payer: Self-pay

## 2020-08-25 NOTE — Telephone Encounter (Signed)
PA for Praluent 150mg /mL submitted via CMM Key: [id number] - PA Case ID: [id number]

## 2020-08-26 NOTE — Telephone Encounter (Signed)
Approved. This drug has been approved under the Member's Medicare Part D benefit. Approved quantity: 2 <> per 28 day(s). You may fill up to a 90 day supply except for those on Specialty Tier 5, which can be filled up to a 30 day supply. Please call the pharmacy to process the prescription claim.

## 2020-09-07 DIAGNOSIS — D649 Anemia, unspecified: Secondary | ICD-10-CM | POA: Diagnosis not present

## 2020-09-29 ENCOUNTER — Inpatient Hospital Stay: Admission: RE | Admit: 2020-09-29 | Payer: Medicare Other | Source: Ambulatory Visit

## 2020-09-29 ENCOUNTER — Other Ambulatory Visit: Payer: Self-pay

## 2020-09-29 ENCOUNTER — Ambulatory Visit
Admission: RE | Admit: 2020-09-29 | Discharge: 2020-09-29 | Disposition: A | Discharge status: Home / Self Care | Payer: Medicare Other | Source: Ambulatory Visit | Attending: Internal Medicine | Admitting: Internal Medicine

## 2020-09-29 DIAGNOSIS — Z1231 Encounter for screening mammogram for malignant neoplasm of breast: Secondary | ICD-10-CM

## 2020-10-04 ENCOUNTER — Ambulatory Visit (INDEPENDENT_AMBULATORY_CARE_PROVIDER_SITE_OTHER): Payer: Medicare Other | Admitting: Podiatry

## 2020-10-04 ENCOUNTER — Other Ambulatory Visit: Payer: Self-pay

## 2020-10-04 ENCOUNTER — Encounter: Payer: Self-pay | Admitting: Podiatry

## 2020-10-04 DIAGNOSIS — M79675 Pain in left toe(s): Secondary | ICD-10-CM | POA: Diagnosis not present

## 2020-10-04 DIAGNOSIS — M79674 Pain in right toe(s): Secondary | ICD-10-CM | POA: Diagnosis not present

## 2020-10-04 DIAGNOSIS — B351 Tinea unguium: Secondary | ICD-10-CM | POA: Diagnosis not present

## 2020-10-05 ENCOUNTER — Other Ambulatory Visit: Payer: Self-pay | Admitting: General Surgery

## 2020-10-05 DIAGNOSIS — K862 Cyst of pancreas: Secondary | ICD-10-CM

## 2020-10-07 NOTE — Progress Notes (Signed)
  Subjective:  Patient ID: Karen West, female    DOB: 11-10-1940,  MRN: [id number]  80 y.o. female presents with at risk foot care with history of diabetic neuropathy and painful thick toenails that are difficult to trim. Pain interferes with ambulation. Aggravating factors include wearing enclosed shoe gear. Pain is relieved with periodic professional debridement..    Patient's blood sugar was 98 mg/dl this morning.  PCP: Deland Pretty, MD and last visit was: 09/07/2020.  Review of Systems: Negative except as noted in the HPI.   Allergies  Allergen Reactions   Tramadol Nausea And Vomiting and Other (See Comments)    "got sick to my stomach and was throwing up blood; it put me in the hospital")   Lisinopril Rash and Other (See Comments)    Renal failure    Rosuvastatin     Other reaction(s): rhabdo. Other reaction(s): rhabdo. Other reaction(s): rhabdo.   Tapazole [Methimazole] Itching and Rash    Objective:  There were no vitals filed for this visit. Constitutional Patient is a pleasant 80 y.o. African American female WD, WN in NAD. AAO x 3.  Vascular Capillary refill time to digits immediate b/l. Palpable DP pulse(s) b/l lower extremities Nonpalpable PT pulse(s) b/l lower extremities. Pedal hair absent. Lower extremity skin temperature gradient within normal limits. No pain with calf compression b/l. No cyanosis or clubbing noted.  Neurologic Normal speech. Protective sensation intact 5/5 intact bilaterally with 10g monofilament b/l. Vibratory sensation intact b/l.  Dermatologic Pedal skin with normal turgor, texture and tone b/l lower extremities. Toenails 1-5 b/l elongated, discolored, dystrophic, thickened, crumbly with subungual debris and tenderness to dorsal palpation. No hyperkeratotic nor porokeratotic lesions present on today's visit.  Orthopedic: Normal muscle strength 5/5 to all lower extremity muscle groups bilaterally. No pain crepitus or joint limitation noted with  ROM b/l. Hallux valgus with bunion deformity noted b/l lower extremities. Hammertoe(s) noted to the 2-5 bilaterally.    Assessment:   1. Pain due to onychomycosis of toenails of both feet    Plan:  Patient was evaluated and treated and all questions answered.  Onychomycosis with pain -Nails palliatively debridement as below. -Educated on self-care  Procedure: Nail Debridement Rationale: Pain Type of Debridement: manual, sharp debridement. Instrumentation: Nail nipper, rotary burr. Number of Nails: 10  -Continue diabetic foot care principles. -Patient to continue soft, supportive shoe gear daily. -Toenails 1-5 b/l were debrided in length and girth with sterile nail nippers and dremel without iatrogenic bleeding.  -Patient to report any pedal injuries to medical professional immediately. -Patient/POA to call should there be question/concern in the interim.  Return in about 3 months (around 01/04/2021).  Marzetta Board, DPM

## 2020-10-23 ENCOUNTER — Other Ambulatory Visit: Payer: Medicare Other

## 2020-10-26 ENCOUNTER — Other Ambulatory Visit: Payer: Self-pay

## 2020-10-26 ENCOUNTER — Ambulatory Visit
Admission: RE | Admit: 2020-10-26 | Discharge: 2020-10-26 | Disposition: A | Discharge status: Home / Self Care | Payer: Medicare Other | Source: Ambulatory Visit | Attending: General Surgery | Admitting: General Surgery

## 2020-10-26 ENCOUNTER — Other Ambulatory Visit: Payer: Self-pay | Admitting: General Surgery

## 2020-10-26 DIAGNOSIS — K862 Cyst of pancreas: Secondary | ICD-10-CM | POA: Diagnosis not present

## 2020-10-26 DIAGNOSIS — R935 Abnormal findings on diagnostic imaging of other abdominal regions, including retroperitoneum: Secondary | ICD-10-CM | POA: Diagnosis not present

## 2020-10-26 DIAGNOSIS — K7689 Other specified diseases of liver: Secondary | ICD-10-CM | POA: Diagnosis not present

## 2020-10-26 DIAGNOSIS — N281 Cyst of kidney, acquired: Secondary | ICD-10-CM | POA: Diagnosis not present

## 2020-10-29 ENCOUNTER — Ambulatory Visit: Payer: Medicare Other | Admitting: Podiatry

## 2020-10-29 DIAGNOSIS — I129 Hypertensive chronic kidney disease with stage 1 through stage 4 chronic kidney disease, or unspecified chronic kidney disease: Secondary | ICD-10-CM | POA: Diagnosis not present

## 2020-10-29 DIAGNOSIS — R809 Proteinuria, unspecified: Secondary | ICD-10-CM | POA: Insufficient documentation

## 2020-10-29 DIAGNOSIS — N1832 Chronic kidney disease, stage 3b: Secondary | ICD-10-CM | POA: Diagnosis not present

## 2020-10-29 DIAGNOSIS — N183 Chronic kidney disease, stage 3 unspecified: Secondary | ICD-10-CM | POA: Diagnosis not present

## 2020-10-29 DIAGNOSIS — E1122 Type 2 diabetes mellitus with diabetic chronic kidney disease: Secondary | ICD-10-CM | POA: Diagnosis not present

## 2020-11-04 DIAGNOSIS — E119 Type 2 diabetes mellitus without complications: Secondary | ICD-10-CM | POA: Diagnosis not present

## 2020-11-04 DIAGNOSIS — E7801 Familial hypercholesterolemia: Secondary | ICD-10-CM | POA: Diagnosis not present

## 2020-11-04 DIAGNOSIS — E0590 Thyrotoxicosis, unspecified without thyrotoxic crisis or storm: Secondary | ICD-10-CM | POA: Diagnosis not present

## 2020-11-04 DIAGNOSIS — E785 Hyperlipidemia, unspecified: Secondary | ICD-10-CM | POA: Diagnosis not present

## 2020-11-04 DIAGNOSIS — I10 Essential (primary) hypertension: Secondary | ICD-10-CM | POA: Diagnosis not present

## 2020-11-09 DIAGNOSIS — E1142 Type 2 diabetes mellitus with diabetic polyneuropathy: Secondary | ICD-10-CM | POA: Diagnosis not present

## 2020-11-09 DIAGNOSIS — E890 Postprocedural hypothyroidism: Secondary | ICD-10-CM | POA: Diagnosis not present

## 2020-11-09 DIAGNOSIS — I6523 Occlusion and stenosis of bilateral carotid arteries: Secondary | ICD-10-CM | POA: Diagnosis not present

## 2020-11-09 DIAGNOSIS — Z0000 Encounter for general adult medical examination without abnormal findings: Secondary | ICD-10-CM | POA: Diagnosis not present

## 2020-11-09 DIAGNOSIS — Z8739 Personal history of other diseases of the musculoskeletal system and connective tissue: Secondary | ICD-10-CM | POA: Diagnosis not present

## 2020-11-09 DIAGNOSIS — I700 Atherosclerosis of aorta: Secondary | ICD-10-CM | POA: Diagnosis not present

## 2020-11-09 DIAGNOSIS — N1831 Chronic kidney disease, stage 3a: Secondary | ICD-10-CM | POA: Diagnosis not present

## 2020-11-09 DIAGNOSIS — E1121 Type 2 diabetes mellitus with diabetic nephropathy: Secondary | ICD-10-CM | POA: Diagnosis not present

## 2020-11-09 DIAGNOSIS — K862 Cyst of pancreas: Secondary | ICD-10-CM | POA: Diagnosis not present

## 2020-11-09 DIAGNOSIS — D519 Vitamin B12 deficiency anemia, unspecified: Secondary | ICD-10-CM | POA: Diagnosis not present

## 2020-11-09 DIAGNOSIS — K2270 Barrett's esophagus without dysplasia: Secondary | ICD-10-CM | POA: Diagnosis not present

## 2020-11-09 DIAGNOSIS — I10 Essential (primary) hypertension: Secondary | ICD-10-CM | POA: Diagnosis not present

## 2020-12-06 DIAGNOSIS — Z961 Presence of intraocular lens: Secondary | ICD-10-CM | POA: Diagnosis not present

## 2020-12-06 DIAGNOSIS — H40013 Open angle with borderline findings, low risk, bilateral: Secondary | ICD-10-CM | POA: Diagnosis not present

## 2020-12-06 DIAGNOSIS — H04123 Dry eye syndrome of bilateral lacrimal glands: Secondary | ICD-10-CM | POA: Diagnosis not present

## 2020-12-06 DIAGNOSIS — Z23 Encounter for immunization: Secondary | ICD-10-CM | POA: Diagnosis not present

## 2020-12-06 DIAGNOSIS — H35363 Drusen (degenerative) of macula, bilateral: Secondary | ICD-10-CM | POA: Diagnosis not present

## 2020-12-22 ENCOUNTER — Other Ambulatory Visit: Payer: Self-pay

## 2020-12-22 ENCOUNTER — Encounter (HOSPITAL_COMMUNITY): Payer: Self-pay | Admitting: Emergency Medicine

## 2020-12-22 ENCOUNTER — Observation Stay (HOSPITAL_COMMUNITY)
Admission: EM | Admit: 2020-12-22 | Discharge: 2020-12-23 | Disposition: A | Discharge status: Home / Self Care | Payer: Medicare Other | Attending: Family Medicine | Admitting: Family Medicine

## 2020-12-22 ENCOUNTER — Emergency Department (HOSPITAL_COMMUNITY): Payer: Medicare Other

## 2020-12-22 DIAGNOSIS — K219 Gastro-esophageal reflux disease without esophagitis: Secondary | ICD-10-CM | POA: Diagnosis present

## 2020-12-22 DIAGNOSIS — Z20822 Contact with and (suspected) exposure to covid-19: Secondary | ICD-10-CM | POA: Insufficient documentation

## 2020-12-22 DIAGNOSIS — Z853 Personal history of malignant neoplasm of breast: Secondary | ICD-10-CM | POA: Diagnosis not present

## 2020-12-22 DIAGNOSIS — I10 Essential (primary) hypertension: Secondary | ICD-10-CM | POA: Diagnosis not present

## 2020-12-22 DIAGNOSIS — E118 Type 2 diabetes mellitus with unspecified complications: Secondary | ICD-10-CM | POA: Diagnosis present

## 2020-12-22 DIAGNOSIS — Z7982 Long term (current) use of aspirin: Secondary | ICD-10-CM | POA: Insufficient documentation

## 2020-12-22 DIAGNOSIS — E119 Type 2 diabetes mellitus without complications: Secondary | ICD-10-CM | POA: Insufficient documentation

## 2020-12-22 DIAGNOSIS — R0789 Other chest pain: Principal | ICD-10-CM | POA: Insufficient documentation

## 2020-12-22 DIAGNOSIS — R079 Chest pain, unspecified: Secondary | ICD-10-CM | POA: Diagnosis present

## 2020-12-22 DIAGNOSIS — I110 Hypertensive heart disease with heart failure: Secondary | ICD-10-CM | POA: Diagnosis not present

## 2020-12-22 DIAGNOSIS — I2510 Atherosclerotic heart disease of native coronary artery without angina pectoris: Secondary | ICD-10-CM | POA: Insufficient documentation

## 2020-12-22 DIAGNOSIS — I5032 Chronic diastolic (congestive) heart failure: Secondary | ICD-10-CM | POA: Diagnosis not present

## 2020-12-22 DIAGNOSIS — E785 Hyperlipidemia, unspecified: Secondary | ICD-10-CM | POA: Diagnosis present

## 2020-12-22 DIAGNOSIS — Z79899 Other long term (current) drug therapy: Secondary | ICD-10-CM | POA: Insufficient documentation

## 2020-12-22 DIAGNOSIS — E1169 Type 2 diabetes mellitus with other specified complication: Secondary | ICD-10-CM | POA: Diagnosis present

## 2020-12-22 DIAGNOSIS — D649 Anemia, unspecified: Secondary | ICD-10-CM | POA: Diagnosis present

## 2020-12-22 LAB — CBC WITH DIFFERENTIAL/PLATELET
Abs Immature Granulocytes: 0.02 10*3/uL (ref 0.00–0.07)
Basophils Absolute: 0 10*3/uL (ref 0.0–0.1)
Basophils Relative: 1 %
Eosinophils Absolute: 0.1 10*3/uL (ref 0.0–0.5)
Eosinophils Relative: 2 %
HCT: 35.1 % — ABNORMAL LOW (ref 36.0–46.0)
Hemoglobin: 11.1 g/dL — ABNORMAL LOW (ref 12.0–15.0)
Immature Granulocytes: 0 %
Lymphocytes Relative: 24 %
Lymphs Abs: 1.8 10*3/uL (ref 0.7–4.0)
MCH: 27.3 pg (ref 26.0–34.0)
MCHC: 31.6 g/dL (ref 30.0–36.0)
MCV: 86.2 fL (ref 80.0–100.0)
Monocytes Absolute: 0.6 10*3/uL (ref 0.1–1.0)
Monocytes Relative: 8 %
Neutro Abs: 4.9 10*3/uL (ref 1.7–7.7)
Neutrophils Relative %: 65 %
Platelets: 320 10*3/uL (ref 150–400)
RBC: 4.07 MIL/uL (ref 3.87–5.11)
RDW: 16.2 % — ABNORMAL HIGH (ref 11.5–15.5)
WBC: 7.5 10*3/uL (ref 4.0–10.5)
nRBC: 0 % (ref 0.0–0.2)

## 2020-12-22 LAB — COMPREHENSIVE METABOLIC PANEL
ALT: 18 U/L (ref 0–44)
AST: 23 U/L (ref 15–41)
Albumin: 3.5 g/dL (ref 3.5–5.0)
Alkaline Phosphatase: 68 U/L (ref 38–126)
Anion gap: 15 (ref 5–15)
BUN: 24 mg/dL — ABNORMAL HIGH (ref 8–23)
CO2: 21 mmol/L — ABNORMAL LOW (ref 22–32)
Calcium: 8.5 mg/dL — ABNORMAL LOW (ref 8.9–10.3)
Chloride: 102 mmol/L (ref 98–111)
Creatinine, Ser: 1.37 mg/dL — ABNORMAL HIGH (ref 0.44–1.00)
GFR, Estimated: 40 mL/min — ABNORMAL LOW (ref >60)
Glucose, Bld: 123 mg/dL — ABNORMAL HIGH (ref 70–99)
Potassium: 3.9 mmol/L (ref 3.5–5.1)
Sodium: 138 mmol/L (ref 135–145)
Total Bilirubin: 0.4 mg/dL (ref 0.3–1.2)
Total Protein: 7.4 g/dL (ref 6.5–8.1)

## 2020-12-22 LAB — LIPASE, BLOOD: Lipase: 87 U/L — ABNORMAL HIGH (ref 11–51)

## 2020-12-22 LAB — TROPONIN I (HIGH SENSITIVITY): Troponin I (High Sensitivity): 12 ng/L (ref <18)

## 2020-12-22 NOTE — ED Provider Notes (Signed)
[facility] Emergency Department Provider Note MRN:  [id number]  Arrival date & time: 12/23/20     Chief Complaint   Chest Pain   History of Present Illness   BREONNA GAFFORD is a 80 y.o. year-old female with a history of hypertension, diabetes, CAD, breast cancer presenting to the ED with chief complaint of chest pain.  Location: Right and central chest Duration: Several hours Onset: Sudden Timing: Constant Description: Pressure, feels similar to prior heart attack back in April Severity: Moderate to severe Exacerbating/Alleviating Factors: Improved with nitroglycerin tablet Associated Symptoms: None Pertinent Negatives: No dizziness or diaphoresis, no nausea vomiting, no trouble breathing, no leg pain or swelling  Additional History: None  Review of Systems  A complete 10 system review of systems was obtained and all systems are negative except as noted in the HPI and PMH.   Patient's Health History    Past Medical History:  Diagnosis Date   Atherosclerosis of aorta ([place])    a. 01/2017/03/2017 - noted on high res chest CTs.   Breast cancer ([place])    a. Bilateral --> s/p left mastectomy   Carotid artery disease ([place])    a. 0/6237 w/ 62-83% LICA stenosis and <15% RICA stenosis; b. 10/2015 Carotid U/S: < 50% BICA stenosis   Chest pain    a. 09/2011 MV: EF 68%, no ischemia/infarct.   Chronic anemia    Chronic headaches    denies   Coronary artery calcification seen on CT scan    a. 01/2017 High res CT: atherosclerotic calcification of the arterial vascularture, including severe involvement of the coronary arteries; b. 03/2017 CT Chest: coronary and Ao atheroscelrosis.   GERD (gastroesophageal reflux disease)    History of echocardiogram    a. 09/2011 Echo: EF 55-60%, no rwma, triv AI, PASP 36mmHg.   Hyperlipidemia    Hypertension    Hyperthyroidism    Left upper lobe pulmonary nodule    a. 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 05/2017 s/p VATS.   Multinodular goiter    a. 02/2017 PET scan- Hypermetabolic nodule;  b. 0/7371 s/p thyroidectomy   Myocardial infarction ([place]) 06/2019   NONSTEMI   Obesity    OSA on CPAP    cpap   Osteoarthritis    Personal history of radiation therapy 1999   Right bundle branch block    Type II or unspecified type diabetes mellitus without mention of complication, uncontrolled     Past Surgical History:  Procedure Laterality Date   [place]; 1995; 2000   left; right; left   BREAST EXCISIONAL BIOPSY Right pt unsure   CARDIAC CATHETERIZATION     CORONARY STENT INTERVENTION N/A 07/03/2019   Procedure: CORONARY STENT INTERVENTION;  Surgeon: Troy Sine, MD;  Location: [facility];  Service: Cardiovascular;  Laterality: N/A;  RCA   ESOPHAGOGASTRODUODENOSCOPY (EGD) WITH PROPOFOL N/A 07/16/2020   Procedure: ESOPHAGOGASTRODUODENOSCOPY (EGD) WITH PROPOFOL;  Surgeon: Carol Ada, MD;  Location: [facility];  Service: Endoscopy;  Laterality: N/A;   FINE NEEDLE ASPIRATION N/A 07/16/2020   Procedure: FINE NEEDLE ASPIRATION (FNA) LINEAR;  Surgeon: Carol Ada, MD;  Location: [facility];  Service: Endoscopy;  Laterality: N/A;   LEFT HEART CATH AND CORONARY ANGIOGRAPHY N/A 07/03/2019   Procedure: LEFT HEART CATH AND CORONARY ANGIOGRAPHY;  Surgeon: Troy Sine, MD;  Location: [facility];  Service: Cardiovascular;  Laterality: N/A;   MASTECTOMY  Left 2000   left   THYROIDECTOMY  05/10/2017   VIDEO BRONCHOSCOPY WITH ENDOBRONCHIAL NAVIGATION (N/A)   THYROIDECTOMY N/A 05/10/2017   Procedure: TOTAL THYROIDECTOMY;  Surgeon: Armandina Gemma, MD;  Location: [facility];  Service: General;  Laterality: N/A;   UPPER ESOPHAGEAL ENDOSCOPIC ULTRASOUND (EUS) N/A 07/16/2020   Procedure: UPPER ESOPHAGEAL ENDOSCOPIC ULTRASOUND (EUS);  Surgeon: Carol Ada, MD;  Location: [facility];  Service: Endoscopy;   Laterality: N/A;   VIDEO ASSISTED THORACOSCOPY (VATS)/ LOBECTOMY Left 06/04/2017   Procedure: VIDEO ASSISTED THORACOSCOPY (VATS)/LEFT UPPER LOBECTOMY;  Surgeon: [name], MD;  Location: [facility];  Service: Thoracic;  Laterality: Left;   VIDEO BRONCHOSCOPY WITH ENDOBRONCHIAL NAVIGATION N/A 05/10/2017   Procedure: VIDEO BRONCHOSCOPY WITH ENDOBRONCHIAL NAVIGATION;  Surgeon: [name], MD;  Location: [facility];  Service: Thoracic;  Laterality: N/A;   VIDEO BRONCHOSCOPY WITH ENDOBRONCHIAL ULTRASOUND N/A 06/04/2017   Procedure: VIDEO BRONCHOSCOPY WITH ENDOBRONCHIAL ULTRASOUND;  Surgeon: [name], MD;  Location: [facility];  Service: Thoracic;  Laterality: N/A;    Family History  Problem Relation Age of Onset   Diabetes Brother        x3   Hypertension Brother        x3   Diabetes Brother    Stroke Brother    Heart attack Mother 36       Mother Died of MI age 30   Diabetes Sister    Stroke Sister     Social History   Socioeconomic History   Marital status: Widowed    Spouse name: Not on file   Number of children: Not on file   Years of education: 16   Highest education level: Bachelor's degree (e.g., BA, AB, BS)  Occupational History   Occupation: retired    Fish farm manager: RETIRED    Comment: accountant  Tobacco Use   Smoking status: Never   Smokeless tobacco: Never  Vaping Use   Vaping Use: Never used  Substance and Sexual Activity   Alcohol use: Yes    Comment: occ   Drug use: No   Sexual activity: Yes    Partners: Male    Birth control/protection: None  Other Topics Concern   Not on file  Social History Narrative   Lives alone.  Four children.    Social Determinants of Health   Financial Resource Strain: Not on file  Food Insecurity: Not on file  Transportation Needs: Not on file  Physical Activity: Not on file  Stress: Not on file  Social Connections: Not on file  Intimate Partner Violence: Not on file     Physical Exam   Vitals:    12/22/20 2126 12/22/20 2341  BP:  (!) 129/58  Pulse:  69  Resp:  18  Temp:  98.6 F (37 C)  SpO2: 98% 99%    CONSTITUTIONAL: Well-appearing, NAD NEURO:  Alert and oriented x 3, no focal deficits EYES:  eyes equal and reactive ENT/NECK:  no LAD, no JVD CARDIO: Regular rate, well-perfused, normal S1 and S2 PULM:  CTAB no wheezing or rhonchi GI/GU:  normal bowel sounds, non-distended, non-tender MSK/SPINE:  No gross deformities, no edema SKIN:  no rash, atraumatic PSYCH:  Appropriate speech and behavior  *Additional and/or pertinent findings included in MDM below  Diagnostic and Interventional Summary    EKG Interpretation  Date/Time:  Wednesday December 22 2020 21:19:56 EDT Ventricular Rate:  72 PR Interval:  120 QRS Duration: 114 QT Interval:  444 QTC Calculation: 486 R Axis:   -  26 Text Interpretation: Normal sinus rhythm Incomplete right bundle branch block Borderline ECG Confirmed by Gerlene Fee [phone]) on 12/22/2020 11:00:08 PM       Labs Reviewed  COMPREHENSIVE METABOLIC PANEL - Abnormal; Notable for the following components:      Result Value   CO2 21 (*)    Glucose, Bld 123 (*)    BUN 24 (*)    Creatinine, Ser 1.37 (*)    Calcium 8.5 (*)    GFR, Estimated 40 (*)    All other components within normal limits  CBC WITH DIFFERENTIAL/PLATELET - Abnormal; Notable for the following components:   Hemoglobin 11.1 (*)    HCT 35.1 (*)    RDW 16.2 (*)    All other components within normal limits  LIPASE, BLOOD - Abnormal; Notable for the following components:   Lipase 87 (*)    All other components within normal limits  RESP PANEL BY RT-PCR (FLU A&B, COVID) ARPGX2  TROPONIN I (HIGH SENSITIVITY)  TROPONIN I (HIGH SENSITIVITY)    DG Chest 2 View  Final Result      Medications - No data to display   Procedures  /  Critical Care Procedures  ED Course and Medical Decision Making  I have reviewed the triage vital signs, the nursing notes, and pertinent  available records from the EMR.  Listed above are laboratory and imaging tests that I personally ordered, reviewed, and interpreted and then considered in my medical decision making (see below for details).  Patient feeling much better at this time but did have some pretty severe chest pain that reminded her of her prior heart attack.  Diffuse coronary disease on last cath, multiple stents placed at that time.  EKG is without significant changes, awaiting troponin.  May need admission.     Troponin is negative x2, patient continues to feel well.  Case discussed with Dr. Chancy Milroy of cardiology, will admit to medicine for further care and cardiology evaluation in the morning.  Barth Kirks. Sedonia Small, Kathleen [email or web address]  Final Clinical Impressions(s) / ED Diagnoses     ICD-10-CM   1. Chest pain, unspecified type  R07.9       ED Discharge Orders     None        Discharge Instructions Discussed with and Provided to Patient:   Discharge Instructions   None       Maudie Flakes, MD 12/23/20 0128

## 2020-12-22 NOTE — ED Provider Notes (Signed)
Emergency Medicine Provider Triage Evaluation Note  Karen West , a 80 y.o. female  was evaluated in triage.  Pt complains of upper centralized chest pain.  This started at about 1400 today while she was sitting watching TV.  Her pain at worst was an 8 out of 10.  She denies any shortness of breath nausea or vomiting does report feeling lightheaded and dizzy.  Before going to bed the pain got worse prompting her to call EMS.  She took aspirin and nitro which improved her pain from a 8 out of 10 down to a 4 out of 10.  She states that this feels similar to when she had a heart attack before.  Review of Systems  Positive: Chest pressure into back Negative: Fevers, cough  Physical Exam  BP (!) 143/74 (BP Location: Right Arm)   Pulse 72   Temp 97.7 F (36.5 C) (Oral)   Resp 18   SpO2 98%  Gen:   Awake, no distress   Resp:  Normal effort  MSK:   Moves extremities without difficulty  Other:  RRR  Medical Decision Making  Medically screening exam initiated at 9:33 PM.  Appropriate orders placed.  Karen West was informed that the remainder of the evaluation will be completed by another provider, this initial triage assessment does not replace that evaluation, and the importance of remaining in the ED until their evaluation is complete.  Note: Portions of this report may have been transcribed using voice recognition software. Every effort was made to ensure accuracy; however, inadvertent computerized transcription errors may be present    Ollen Gross 12/22/20 2134    Valarie Merino, MD 12/22/20 2302

## 2020-12-22 NOTE — ED Triage Notes (Signed)
Pt arrives via [facility] from home for centralized cp that radiates to bil shoulders, started at 1400 today. Pt denies shob, N/V, endorses lightheadedness. Pt states pain got worse before going to bed. Pt took 324mg  ASA and ` SL nitro w/ relief. Pt had MI in 06/2019, 4 stents placed.

## 2020-12-23 ENCOUNTER — Encounter (HOSPITAL_COMMUNITY): Payer: Self-pay | Admitting: Internal Medicine

## 2020-12-23 DIAGNOSIS — K219 Gastro-esophageal reflux disease without esophagitis: Secondary | ICD-10-CM

## 2020-12-23 DIAGNOSIS — D649 Anemia, unspecified: Secondary | ICD-10-CM | POA: Diagnosis not present

## 2020-12-23 DIAGNOSIS — R079 Chest pain, unspecified: Secondary | ICD-10-CM

## 2020-12-23 DIAGNOSIS — I10 Essential (primary) hypertension: Secondary | ICD-10-CM | POA: Diagnosis not present

## 2020-12-23 DIAGNOSIS — E118 Type 2 diabetes mellitus with unspecified complications: Secondary | ICD-10-CM

## 2020-12-23 DIAGNOSIS — R0789 Other chest pain: Secondary | ICD-10-CM | POA: Diagnosis not present

## 2020-12-23 DIAGNOSIS — E785 Hyperlipidemia, unspecified: Secondary | ICD-10-CM | POA: Diagnosis not present

## 2020-12-23 LAB — COMPREHENSIVE METABOLIC PANEL
ALT: 16 U/L (ref 0–44)
AST: 18 U/L (ref 15–41)
Albumin: 3.2 g/dL — ABNORMAL LOW (ref 3.5–5.0)
Alkaline Phosphatase: 65 U/L (ref 38–126)
Anion gap: 12 (ref 5–15)
BUN: 25 mg/dL — ABNORMAL HIGH (ref 8–23)
CO2: 22 mmol/L (ref 22–32)
Calcium: 8.4 mg/dL — ABNORMAL LOW (ref 8.9–10.3)
Chloride: 105 mmol/L (ref 98–111)
Creatinine, Ser: 1.27 mg/dL — ABNORMAL HIGH (ref 0.44–1.00)
GFR, Estimated: 43 mL/min — ABNORMAL LOW (ref >60)
Glucose, Bld: 119 mg/dL — ABNORMAL HIGH (ref 70–99)
Potassium: 3.9 mmol/L (ref 3.5–5.1)
Sodium: 139 mmol/L (ref 135–145)
Total Bilirubin: 0.3 mg/dL (ref 0.3–1.2)
Total Protein: 6.9 g/dL (ref 6.5–8.1)

## 2020-12-23 LAB — RESP PANEL BY RT-PCR (FLU A&B, COVID) ARPGX2
Influenza A by PCR: NEGATIVE
Influenza B by PCR: NEGATIVE
SARS Coronavirus 2 by RT PCR: NEGATIVE

## 2020-12-23 LAB — CBC
HCT: 34.3 % — ABNORMAL LOW (ref 36.0–46.0)
Hemoglobin: 10.8 g/dL — ABNORMAL LOW (ref 12.0–15.0)
MCH: 27.3 pg (ref 26.0–34.0)
MCHC: 31.5 g/dL (ref 30.0–36.0)
MCV: 86.8 fL (ref 80.0–100.0)
Platelets: 301 10*3/uL (ref 150–400)
RBC: 3.95 MIL/uL (ref 3.87–5.11)
RDW: 16 % — ABNORMAL HIGH (ref 11.5–15.5)
WBC: 6.4 10*3/uL (ref 4.0–10.5)
nRBC: 0 % (ref 0.0–0.2)

## 2020-12-23 LAB — PROTIME-INR
INR: 0.9 (ref 0.8–1.2)
Prothrombin Time: 12.1 seconds (ref 11.4–15.2)

## 2020-12-23 LAB — CBG MONITORING, ED: Glucose-Capillary: 103 mg/dL — ABNORMAL HIGH (ref 70–99)

## 2020-12-23 LAB — MAGNESIUM: Magnesium: 1.5 mg/dL — ABNORMAL LOW (ref 1.7–2.4)

## 2020-12-23 LAB — TROPONIN I (HIGH SENSITIVITY)
Troponin I (High Sensitivity): 13 ng/L (ref <18)
Troponin I (High Sensitivity): 12 ng/L (ref <18)

## 2020-12-23 MED ORDER — ACETAMINOPHEN 325 MG PO TABS: 650.0000 mg | ORAL_TABLET | Freq: Four times a day (QID) | ORAL | Status: DC | PRN

## 2020-12-23 MED ORDER — INSULIN ASPART 100 UNIT/ML IJ SOLN: 0.0000 [IU] | Freq: Three times a day (TID) | INTRAMUSCULAR | Status: DC

## 2020-12-23 MED ORDER — NITROGLYCERIN 0.4 MG SL SUBL
0.4000 mg | SUBLINGUAL_TABLET | SUBLINGUAL | Status: DC | PRN
Start: 2020-12-23 — End: 2020-12-23

## 2020-12-23 MED ORDER — LEVOTHYROXINE SODIUM 88 MCG PO TABS
88.0000 ug | ORAL_TABLET | Freq: Every day | ORAL | Status: DC
  Administered 2020-12-23: 88 ug via ORAL
  Filled 2020-12-23 (×2): qty 1

## 2020-12-23 MED ORDER — ASPIRIN EC 81 MG PO TBEC
81.0000 mg | DELAYED_RELEASE_TABLET | Freq: Every day | ORAL | Status: DC
  Administered 2020-12-23: 81 mg via ORAL
  Filled 2020-12-23: qty 1

## 2020-12-23 MED ORDER — EZETIMIBE 10 MG PO TABS: 10.0000 mg | ORAL_TABLET | Freq: Every evening | ORAL | Status: DC

## 2020-12-23 MED ORDER — METOPROLOL TARTRATE 25 MG PO TABS
25.0000 mg | ORAL_TABLET | Freq: Two times a day (BID) | ORAL | Status: DC
  Administered 2020-12-23: 25 mg via ORAL
  Filled 2020-12-23: qty 1

## 2020-12-23 MED ORDER — MAGNESIUM SULFATE 2 GM/50ML IV SOLN
2.0000 g | Freq: Once | INTRAVENOUS | Status: AC
  Administered 2020-12-23: 2 g via INTRAVENOUS
  Filled 2020-12-23: qty 50

## 2020-12-23 MED ORDER — ACETAMINOPHEN 650 MG RE SUPP: 650.0000 mg | Freq: Four times a day (QID) | RECTAL | Status: DC | PRN

## 2020-12-23 MED ORDER — CARVEDILOL 12.5 MG PO TABS
12.5000 mg | ORAL_TABLET | Freq: Two times a day (BID) | ORAL | Status: DC
  Filled 2020-12-23: qty 1

## 2020-12-23 MED ORDER — PANTOPRAZOLE SODIUM 40 MG PO TBEC
40.0000 mg | DELAYED_RELEASE_TABLET | Freq: Every day | ORAL | Status: DC
  Administered 2020-12-23: 40 mg via ORAL
  Filled 2020-12-23: qty 1

## 2020-12-23 MED ORDER — AMLODIPINE BESYLATE 5 MG PO TABS
10.0000 mg | ORAL_TABLET | Freq: Every day | ORAL | Status: DC
  Administered 2020-12-23: 10 mg via ORAL
  Filled 2020-12-23: qty 2

## 2020-12-23 NOTE — H&P (Signed)
History and Physical    PLEASE NOTE THAT DRAGON DICTATION SOFTWARE WAS USED IN THE CONSTRUCTION OF THIS NOTE.   Karen West VQQ:[id number] DOB: 11-20-1940 DOA: 12/22/2020  PCP: Deland Pretty, MD Patient coming from: home   I have personally briefly reviewed patient's old medical records in [place]  Chief Complaint: chest pain  HPI: Karen West is a 80 y.o. female with medical history significant for CAD s/p PCI with DES x 4 in April 4332, chronic diastolic heart failure, hypertension, hyperlipidemia, type 2 diabetes mellitus, who is admitted to [facility] on 12/22/2020 for further evaluation management of presenting chest pain.   The patient reports she was at rest at approximately 1400 on 12/22/2020 when she developed sudden onset of substernal chest pressure without radiation.  She reports no ensuing ambulation or exertion in order to evaluate her response and chest pain to this measure.  Over, she reports that her chest pain remained constant for approximately 5 hours, or contacting EMS who administered a sublingual nitroglycerin, noted the chest pain to completely resolved without subsequent recurrence.  She reports that the chest pain was nonpositional, nonpleuritic, not reproducible with direct palpation over the anterior chest wall.  Denies any associated shortness of breath, nausea, vomiting, diaphoresis, dizziness, palpitations, presyncope, or syncope.  She also denies any associated cough, hemoptysis, calf tenderness, new lower extremity erythema, or worsening of edema in the bilateral lower extremities.  Not associated with any recent subjective fever, chills, rigors, or generalized myalgias.  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  At that time, she  underwent left-sided heart cath which showed 70% stenosis of the proximal LAD to mid LAD, as well as 95% stenosis of the distal RCA for which he underwent placement of 4 drug-eluting stents.  She reports that she has experienced no chest pain from that time the PCI with stent placement in April 2021 until earlier this evening.  Most recent echocardiogram performed in May 2022 was notable for LVEF 60 to 65%, no focal wall motion abnormalities, grade 1 diastolic dysfunction, mildly dilated left atrium and mild mitral regurgitation.  Following the above PCI with stent placement x 4 in April 2021, she was treated with a year of dual antiplatelet therapy followed by daily baby aspirin.  She reports that she has been compliant with all her outpatient cardiac medications.    Denies any recent trauma, travel, surgery, extended periods of diminishing ambulatory activity, or any personal history of malignancy.  She also has a history of hyperlipidemia for which she is on [place] in the absence of any statin medication due to documentation of a history of rhabdomyolysis in response to rosuvastatin.  Medical history also notable for type 2 diabetes mellitus which is on metformin linagliptin and dapagliflozin.    The patient notes that upon onset of her chest pain earlier today, she took 4 baby aspirin at home prior to arrival of EMS, who subsequently brought the patient to [facility] emergency department for further evaluation management of her chest pain.         ED Course:  Vital signs in the ED were notable for the following: Temperature max 98.6, heart rate 70-72; blood pressure 129/58-143/74; respiratory rate 18, oxygen saturation 98 to 100% on room air.  Labs were notable for  the following: CMP notable for the following: Sodium 130, potassium 3.9, creatinine 1.37 relative to most recent prior value 1.33 on 07/27/2020, calcium corrected for mild hypoalbuminemia noted to be 8.9, albumin 3.5, otherwise, liver  enzymes were found to be within normal limits.  Lipase 87.  High-sensitivity troponin I initially noted to be 12, repeat value trending up slightly to 13.  CBC notable for white cell count 7500.  Screening COVID-19/influenza PCR were checked in the ED this evening, with results currently pending.  Imaging and additional notable ED work-up: EKG, in comparison to most recent prior from November 2021 showed sinus rhythm with heart rate 72, incomplete right bundle branch block, nonspecific T wave inversions in leads III, aVF, V1, V3, of which T wave inversion in V3 and V1 are unchanged from most recent prior EKG, while demonstrating no evidence of ST changes, including no evidence of ST elevation.  Chest x-ray shows no evidence of acute cardiopulmonary process, including no evidence of infiltrate, edema, effusion, or pneumothorax.  EDP discussed the patient's case with the on-call cardiologist, Dr. Darrow Bussing, who recommended admission to the hospitalist service for further evaluation and management of presenting chest pain, and cardiology plans to formally consult and see patient in the AM.       Review of Systems: As per HPI otherwise 10 point review of systems negative.   Past Medical History:  Diagnosis Date   Atherosclerosis of aorta ([place])    a. 01/2017/03/2017 - noted on high res chest CTs.   Breast cancer ([place])    a. Bilateral --> s/p left mastectomy   Carotid artery disease ([place])    a. 0/3474 w/ 25-95% LICA stenosis and <63% RICA stenosis; b. 10/2015 Carotid U/S: < 50% BICA stenosis   Chest pain    a. 09/2011 MV: EF 68%, no ischemia/infarct.   Chronic anemia    Chronic headaches    denies   Coronary artery calcification seen on CT scan    a. 01/2017 High res CT: atherosclerotic calcification of the arterial vascularture, including severe involvement of the coronary arteries; b. 03/2017 CT Chest: coronary and Ao atheroscelrosis.   GERD (gastroesophageal reflux disease)    History of  echocardiogram    a. 09/2011 Echo: EF 55-60%, no rwma, triv AI, PASP 61mmHg.   Hyperlipidemia    Hypertension    Hyperthyroidism    Left upper lobe pulmonary nodule    a. 02/2017 PET: slowing enlarging 1.7cm LUL nodule w/ low-grade metabolic activity; b. 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 05/2017 s/p VATS.   Multinodular goiter    a. 02/2017 PET scan- Hypermetabolic nodule;  b. 05/3293 s/p thyroidectomy   Myocardial infarction ([place]) 06/2019   NONSTEMI   Obesity    OSA on CPAP    cpap   Osteoarthritis    Personal history of radiation therapy 1999   Right bundle branch block    Type II or unspecified type diabetes mellitus without mention of complication, uncontrolled     Past Surgical History:  Procedure Laterality Date   [place]; 1995; 2000   left; right; left   BREAST EXCISIONAL BIOPSY Right pt unsure   CARDIAC CATHETERIZATION     CORONARY STENT INTERVENTION N/A 07/03/2019   Procedure: CORONARY STENT INTERVENTION;  Surgeon: Troy Sine, MD;  Location: [place] [facility];  Service: Cardiovascular;  Laterality: N/A;  RCA   ESOPHAGOGASTRODUODENOSCOPY (EGD) WITH PROPOFOL N/A 07/16/2020   Procedure: ESOPHAGOGASTRODUODENOSCOPY (EGD) WITH PROPOFOL;  Surgeon: Carol Ada, MD;  Location:  [facility];  Service: Endoscopy;  Laterality: N/A;   FINE NEEDLE ASPIRATION N/A 07/16/2020   Procedure: FINE NEEDLE ASPIRATION (FNA) LINEAR;  Surgeon: Carol Ada, MD;  Location: [facility];  Service: Endoscopy;  Laterality: N/A;   LEFT HEART CATH AND CORONARY ANGIOGRAPHY N/A 07/03/2019   Procedure: LEFT HEART CATH AND CORONARY ANGIOGRAPHY;  Surgeon: Troy Sine, MD;  Location: [place] [facility];  Service: Cardiovascular;  Laterality: N/A;   MASTECTOMY Left 2000   left   THYROIDECTOMY  05/10/2017   VIDEO BRONCHOSCOPY WITH ENDOBRONCHIAL NAVIGATION (N/A)   THYROIDECTOMY N/A 05/10/2017   Procedure: TOTAL THYROIDECTOMY;  Surgeon: Armandina Gemma, MD;  Location: [facility];  Service:  General;  Laterality: N/A;   UPPER ESOPHAGEAL ENDOSCOPIC ULTRASOUND (EUS) N/A 07/16/2020   Procedure: UPPER ESOPHAGEAL ENDOSCOPIC ULTRASOUND (EUS);  Surgeon: Carol Ada, MD;  Location: [facility];  Service: Endoscopy;  Laterality: N/A;   VIDEO ASSISTED THORACOSCOPY (VATS)/ LOBECTOMY Left 06/04/2017   Procedure: VIDEO ASSISTED THORACOSCOPY (VATS)/LEFT UPPER LOBECTOMY;  Surgeon: [name], MD;  Location: [facility];  Service: Thoracic;  Laterality: Left;   VIDEO BRONCHOSCOPY WITH ENDOBRONCHIAL NAVIGATION N/A 05/10/2017   Procedure: VIDEO BRONCHOSCOPY WITH ENDOBRONCHIAL NAVIGATION;  Surgeon: [name], MD;  Location: [facility];  Service: Thoracic;  Laterality: N/A;   VIDEO BRONCHOSCOPY WITH ENDOBRONCHIAL ULTRASOUND N/A 06/04/2017   Procedure: VIDEO BRONCHOSCOPY WITH ENDOBRONCHIAL ULTRASOUND;  Surgeon: [name], MD;  Location: [place];  Service: Thoracic;  Laterality: N/A;    Social History:  reports that she has never smoked. She has never used smokeless tobacco. She reports current alcohol use. She reports that she does not use drugs.   Allergies  Allergen Reactions   Tramadol Nausea And Vomiting and Other (See Comments)    "got sick to my stomach and was throwing up blood; it put me in the hospital")   Lisinopril Rash and Other (See Comments)    Renal failure    Rosuvastatin     Other reaction(s): rhabdo. Other reaction(s): rhabdo. Other reaction(s): rhabdo.   Tapazole [Methimazole] Itching and Rash    Family History  Problem Relation Age of Onset   Diabetes Brother        x3   Hypertension Brother        x3   Diabetes Brother    Stroke Brother    Heart attack Mother 59       Mother Died of MI age 60   Diabetes Sister    Stroke Sister     Family history reviewed and not pertinent    Prior to Admission medications   Medication Sig Start Date End Date Taking? Authorizing Provider  acetaminophen (TYLENOL) 500 MG tablet Take 500-1,000 mg by mouth  every 6 (six) hours as needed for moderate pain or headache.     [provider]  Alirocumab (PRALUENT) 150 MG/ML SOAJ Inject 150 mg into the skin every 14 (fourteen) days. 02/23/20   Minus Breeding, MD  amLODipine (NORVASC) 10 MG tablet Take 10 mg by mouth daily.    [provider]  aspirin EC 81 MG tablet Take 81 mg by mouth daily. Swallow whole.    [provider]  carvedilol (COREG) 12.5 MG tablet Take 12.5 mg by mouth 2 (two) times daily with a meal.    [provider]  Cholecalciferol (VITAMIN D3) 50 MCG (2000 UT) TABS Take 2,000 Units by mouth daily.    [provider]  COVID-19 mRNA vaccine, Moderna, 100 MCG/0.5ML injection  Inject into the muscle. 08/18/20   Carlyle Basques, MD  Empagliflozin-Linaglip-Metform (TRIJARDY XR) 12.5-2.07-998 MG TB24 Take 1 tablet by mouth in the morning and at bedtime.    [provider]  ezetimibe (ZETIA) 10 MG tablet Take 10 mg by mouth every evening.    [provider]  ferrous sulfate 325 (65 FE) MG tablet Take 325 mg by mouth 2 (two) times daily. 09/27/20   [provider]  hydrocortisone cream 1 % Apply 1 application topically 2 (two) times daily as needed for itching.     [provider]  levothyroxine (SYNTHROID, LEVOTHROID) 88 MCG tablet Take 88 mcg by mouth daily before breakfast.  07/02/17   [provider]  metFORMIN (GLUCOPHAGE) 1000 MG tablet Take 1 tablet by mouth 2 (two) times daily. 02/01/20   [provider]  nitroGLYCERIN (NITROSTAT) 0.4 MG SL tablet Place 1 tablet (0.4 mg total) under the tongue every 5 (five) minutes as needed for chest pain. 10/28/19 02/05/20  Deberah Pelton, NP  Omega-3 Fatty Acids (FISH OIL) 1200 MG CAPS Take 1,200 mg by mouth 2 (two) times daily.     [provider]  omeprazole (PRILOSEC) 20 MG capsule Take 20 mg by mouth 2 (two) times daily.     [provider]  vitamin B-12 (CYANOCOBALAMIN) 1000 MCG tablet  Take 1,000 mcg by mouth daily.    [provider]     Objective    Physical Exam: Vitals:   12/22/20 2121 12/22/20 2126 12/22/20 2341  BP: (!) 143/74  (!) 129/58  Pulse: 72  69  Resp: 18  18  Temp: 97.7 F (36.5 C)  98.6 F (37 C)  TempSrc: Oral  Oral  SpO2: 100% 98% 99%  Weight:   67.1 kg  Height:   5\' 1"  (1.549 m)    General: appears to be stated age; alert, oriented Skin: warm, dry, no rash Head:  AT/[place] Mouth:  Oral mucosa membranes appear moist, normal dentition Neck: supple; trachea midline Heart:  RRR; did not appreciate any M/R/G Lungs: CTAB, did not appreciate any wheezes, rales, or rhonchi Abdomen: + BS; soft, ND, NT Vascular: 2+ pedal pulses b/l; 2+ radial pulses b/l Extremities: no peripheral edema, no muscle wasting Neuro: strength and sensation intact in upper and lower extremities b/l   Labs on Admission: I have personally reviewed following labs and imaging studies  CBC: Recent Labs  Lab 12/22/20 2141  WBC 7.5  NEUTROABS 4.9  HGB 11.1*  HCT 35.1*  MCV 86.2  PLT 193   Basic Metabolic Panel: Recent Labs  Lab 12/22/20 2141  NA 138  K 3.9  CL 102  CO2 21*  GLUCOSE 123*  BUN 24*  CREATININE 1.37*  CALCIUM 8.5*   GFR: Estimated Creatinine Clearance: 29.7 mL/min (A) (by C-G formula based on SCr of 1.37 mg/dL (H)). Liver Function Tests: Recent Labs  Lab 12/22/20 2141  AST 23  ALT 18  ALKPHOS 68  BILITOT 0.4  PROT 7.4  ALBUMIN 3.5   Recent Labs  Lab 12/22/20 2141  LIPASE 87*   No results for input(s): AMMONIA in the last 168 hours. Coagulation Profile: No results for input(s): INR, PROTIME in the last 168 hours. Cardiac Enzymes: No results for input(s): CKTOTAL, CKMB, CKMBINDEX, TROPONINI in the last 168 hours. BNP (last 3 results) No results for input(s): PROBNP in the last 8760 hours. HbA1C: No results for input(s): HGBA1C in the last 72 hours. CBG: No results for input(s): GLUCAP in  the last 168 hours. Lipid  Profile: No results for input(s): CHOL, HDL, LDLCALC, TRIG, CHOLHDL, LDLDIRECT in the last 72 hours. Thyroid Function Tests: No results for input(s): TSH, T4TOTAL, FREET4, T3FREE, THYROIDAB in the last 72 hours. Anemia Panel: No results for input(s): VITAMINB12, FOLATE, FERRITIN, TIBC, IRON, RETICCTPCT in the last 72 hours. Urine analysis:    Component Value Date/Time   COLORURINE STRAW (A) 08/22/2019 2033   APPEARANCEUR CLEAR 08/22/2019 2033   LABSPEC 1.010 08/22/2019 2033   PHURINE 6.0 08/22/2019 2033   GLUCOSEU >=500 (A) 08/22/2019 2033   HGBUR LARGE (A) 08/22/2019 2033   BILIRUBINUR NEGATIVE 08/22/2019 2033   [place] NEGATIVE 08/22/2019 2033   PROTEINUR 100 (A) 08/22/2019 2033   UROBILINOGEN 1.0 02/07/2008 1645   NITRITE NEGATIVE 08/22/2019 2033   LEUKOCYTESUR NEGATIVE 08/22/2019 2033    Radiological Exams on Admission: DG Chest 2 View  Result Date: 12/22/2020 CLINICAL DATA:  Chest pain. EXAM: CHEST - 2 VIEW COMPARISON:  Chest CT 07/27/2020.  Chest x-ray 08/22/2019 FINDINGS: Left upper lobectomy changes are again seen. There is some linear atelectasis or scarring in the left lung base. There is no focal lung infiltrate. Costophrenic angles are clear. There is no pneumothorax. Cardiomediastinal silhouette is within normal limits. There are surgical clips in the bilateral axilla. There is a healed left seventh rib fracture. IMPRESSION: No active cardiopulmonary disease. Electronically Signed   By: Ronney Asters M.D.   On: 12/22/2020 21:53     EKG: Independently reviewed, with result as described above.    Assessment/Plan   Karen West is a 80 y.o. female with medical history significant for CAD s/p PCI with DES x 4 in April 5397, chronic diastolic heart failure, hypertension, hyperlipidemia, type 2 diabetes mellitus, who is admitted to [facility] on 12/22/2020 for further evaluation management of presenting chest pain.    Principal Problem:   Chest pain Active  Problems:   Type 2 diabetes mellitus with complication, without long-term current use of insulin (HCC)   Essential hypertension   G E R D   HLD (hyperlipidemia)   Chronic anemia     #) Chest Pain: 1 episode of substernal chest pressure that completely resolved following sublingual nitroglycerin, without subsequent recurrence, and without interval exertion to evaluate response in ensuing chest pain.  This is in the context of known coronary artery disease status post 4 drug-eluting stents in the distal RCA in April 2021, with associated left heart cath showing 70% stenosis of the proximal to mid LAD, as further detailed above, with the patient noting that this evening's chest pain was very similar to that which she was experiencing leading up to the above left heart cath in April 2021.  Of note, following resolution of chest pain earlier with sublingual nitroglycerin, patient denies any ensuing chest pain but confirms that she is currently chest pain-free.  High-sensitivity troponin IV x2 has been nonelevated, while EKG shows sinus rhythm with nonspecific T wave inversion in aVF in the V 3, as further detailed above, without any evidence of ST changes, including no evidence of ST elevation, while chest x-ray shows no evidence of acute cardiopulmonary process, including no evidence of pneumothorax.   Case was d/w the on-call cardiologist, Dr. Darrow Bussing, who recommended admission to the hospitalist service for further evaluation and management of presenting chest pain, and cardiology plans to formally consult and see patient in the AM, and assisting with determination of the necessity/nature of additional ischemic evaluation.  Of note, the patient  received a full dose aspirin at home earlier this evening resenting to the ED.  Clinically, presentation appears less suggestive of acute pulmonary embolism.  Presenting lipase 87.    Plan: trend serial troponin. Monitor on telemetry. PRN sublingual nitroglycerin.  PRN EKG for subsequent episodes of chest pain. Check serum Mg level and repeat BMP in the morning, with prn supplementation to maintain Mg and potassium levels greater than or equal to 2.0 and 4.0, respectively, to further reduce risk of ventricular arrhythmia. Repeat CBC in the AM.  Continue home daily baby aspirin for now.  Resume home beta-blocker.  As the patient reports history of rhabdomyolysis in response to rosuvastatin, refraining from initiation of statin at this time.  Continue home Zetia.  Echocardiogram ordered for.  Cardiology consulted, as above.  Repeat CMP and CBC in the morning.      #) Chronic diastolic heart failure: documented history of such, with most recent echocardiogram performed in May 2022, with results as further detailed above. No clinical evidence to suggest acutely decompensated heart failure at this time.  Not on any scheduled diuretic medications at home.   Plan: monitor strict I's & O's and daily weights. Repeat BMP in the morning. Check serum magnesium level.  Monitor on telemetry.  Further evaluation management of presenting chest pain, as above.  Monitor on telemetry.  In the setting of presenting chest discomfort, cardiac arrest also been ordered for the.      #) Chronic iron deficiency anemia: Documented history of such, with baseline hemoglobin noted to be in the range of 9-11.  On daily oral iron supplementation.  Presenting labs reflect hemoglobin consistent with this baseline range, without evidence of active bleed at this time.  Plan: Repeat CBC in the morning.  Check INR.       #) GERD: On Meprazole as an outpatient.  Plan: Continue PPI.      #) Essential hypertension: Outpatient hypertensive regimen includes Coreg as well as Norvasc.  Systolic blood pressures in the 120s to 140s mmHg thus far in the ED.  We will resume home beta-blocker, holding home Norvasc for now.  Plan: Resume home beta-blocker.  Hold home Norvasc, as detailed above.   Close monitoring of ensuing blood pressure via routine vital signs.  Monitor oximetry.      #) Hyperlipidemia: Documented history of such, on Zetia in the absence of any statin medications given documented history of rhabdomyolysis in response to rosuvastatin.  Plan: Continue home Zetia.  Refraining from statin administration, and spite of presenting chest discomfort in light of a reported history of rhabdomyolysis secondary to rosuvastatin, as above.      #) Type 2 diabetes mellitus: On empagliflozin, linagliptin, and metformin as an outpatient, in the absence of any insulin at home.  Presenting blood sugar per CMP noted to be 123.  Plan: Hold home oral hypoglycemic agents during this hospitalization.  Accu-Cheks before every meal and at bedtime with sliding scale insulin.       #) Acquired hypothyroidism: Documented history of such, on Synthroid as an outpatient.  Plan: Continue home Synthroid.      #) Chronic kidney disease 3A: With baseline creatinine range of 1.3-1.4, with presenting serum creatinine found to be consistent with this range.  Plan: Monitor strict I's and O's Daily weights.  Attempt to avoid nephrotoxic agents.  Repeat BMP in the morning.     DVT prophylaxis: SCDs Code Status: Full code Family Communication: none Disposition Plan: Per Rounding Team Consults called: case  discussed with cardiology, who will consult and fomally see the patient in the morning, as further detailed above ;  admission status: Observation; cardiac telemetry   Of note, this patient was added by me to the following Admit List/Treatment Team:  mcadmits.   Of note, the Adult Admission Order Set (Multimorbid order set) was used by me in the admission process for this patient.  PLEASE NOTE THAT DRAGON DICTATION SOFTWARE WAS USED IN THE CONSTRUCTION OF THIS NOTE.   Rhetta Mura DO [facility] Pager [phone] From [place]   12/23/2020, 1:34 AM

## 2020-12-23 NOTE — Discharge Summary (Addendum)
Physician Discharge Summary  Karen West OJJ:[id number] DOB: [birth year] DOA: 12/22/2020  PCP: Deland Pretty, MD  Admit date: 12/22/2020 Discharge date: 12/23/2020    Admitted From: Home Disposition: Home  Recommendations for Outpatient Follow-up:  Follow up with PCP in 1-2 weeks Please obtain BMP/CBC in one week Please follow up with your PCP on the following pending results: Unresulted Labs (From admission, onward)    None         Home Health: None Equipment/Devices: None  Discharge Condition: Stable CODE STATUS: Full code Diet recommendation: Cardiac  Subjective: Seen and examined this morning.  No more chest pain.  Wants to go home.   4 over interested readers, I have copied HPI and ED course from our dear colleague admitting hospitalist below. HPI: Karen West is a 80 y.o. female with medical history significant for CAD s/p PCI with DES x 4 in April 9371, chronic diastolic heart failure, hypertension, hyperlipidemia, type 2 diabetes mellitus, who is admitted to [facility] on 12/22/2020 for further evaluation management of presenting chest pain.    The patient reports she was at rest at approximately 1400 on 12/22/2020 when she developed sudden onset of substernal chest pressure without radiation.  She reports no ensuing ambulation or exertion in order to evaluate her response and chest pain to this measure.  Over, she reports that her chest pain remained constant for approximately 5 hours, or contacting EMS who administered a sublingual nitroglycerin, noted the chest pain to completely resolved without subsequent recurrence.  She reports that the chest pain was nonpositional, nonpleuritic, not reproducible with direct palpation over the anterior chest wall.  Denies any associated shortness of breath, nausea, vomiting, diaphoresis, dizziness, palpitations, presyncope, or syncope.  She also denies any associated cough, hemoptysis, calf tenderness, new lower  extremity erythema, or worsening of edema in the bilateral lower extremities.  Not associated with any recent subjective fever, chills, rigors, or generalized myalgias.  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  At that time, she underwent left-sided heart cath which showed 70% stenosis of the proximal LAD to mid LAD, as well as 95% stenosis of the distal RCA for which he underwent placement of 4 drug-eluting stents.  She reports that she has experienced no chest pain from that time the PCI with stent placement in April 2021 until earlier this evening.  Most recent echocardiogram performed in May 2022 was notable for LVEF 60 to 65%, no focal wall motion abnormalities, grade 1 diastolic dysfunction, mildly dilated left atrium and mild mitral regurgitation.  Following the above PCI with stent placement x 4 in April 2021, she was treated with a year of dual antiplatelet therapy followed by daily baby aspirin.  She reports that she has been compliant with all her outpatient cardiac medications.     Denies any recent trauma, travel, surgery, extended periods of diminishing ambulatory activity, or any personal history of malignancy.   She also has a history of hyperlipidemia for which she is on [place] in the absence of any statin medication due to documentation of a history of rhabdomyolysis in response to rosuvastatin.  Medical history also notable for type 2 diabetes mellitus which is on metformin linagliptin and dapagliflozin.     The patient notes that upon onset of her chest pain earlier today,  she took 4 baby aspirin at home prior to arrival of EMS, who subsequently brought the patient to [facility] emergency department for further evaluation management of her chest pain.               ED Course:   Vital signs in the ED were notable for the following: Temperature max 98.6, heart rate 70-72; blood pressure 129/58-143/74; respiratory rate 18, oxygen saturation 98 to 100% on room air.   Labs were notable for the following: CMP notable for the following: Sodium 130, potassium 3.9, creatinine 1.37 relative to most recent prior value 1.33 on 07/27/2020, calcium corrected for mild hypoalbuminemia noted to be 8.9, albumin 3.5, otherwise, liver enzymes were found to be within normal limits.  Lipase 87.  High-sensitivity troponin I initially noted to be 12, repeat value trending up slightly to 13.  CBC notable for white cell count 7500.  Screening COVID-19/influenza PCR were checked in the ED this evening, with results currently pending.   Imaging and additional notable ED work-up: EKG, in comparison to most recent prior from November 2021 showed sinus rhythm with heart rate 72, incomplete right bundle branch block, nonspecific T wave inversions in leads III, aVF, V1, V3, of which T wave inversion in V3 and V1 are unchanged from most recent prior EKG, while demonstrating no evidence of ST changes, including no evidence of ST elevation.  Chest x-ray shows no evidence of acute cardiopulmonary process, including no evidence of infiltrate, edema, effusion, or pneumothorax.   EDP discussed the patient's case with the on-call cardiologist, Dr. Darrow Bussing, who recommended admission to the hospitalist service for further evaluation and management of presenting chest pain, and cardiology plans to formally consult and see patient in the AM.        Brief/Interim Summary: In short, patient came in with chest pain.  Her troponins are completely normal.  Hemodynamically stable.  This morning when I saw her, she had no chest pain.  She is completely ruled out of ACS.  She wanted to go home which was very reasonable so she was discharged in stable condition.  Of note, her magnesium was 1.5/low.  She received 2 g of IV magnesium  sulfate before discharge.  Discharge Diagnoses:  Principal Problem:   Chest pain Active Problems:   Type 2 diabetes mellitus with complication, without long-term current use of insulin (HCC)   Essential hypertension   G E R D   HLD (hyperlipidemia)   Chronic anemia    Discharge Instructions   Allergies as of 12/23/2020       Reactions   Tramadol Nausea And Vomiting, Other (See Comments)   "got sick to my stomach and was throwing up blood; it put me in the hospital")   Lisinopril Rash, Other (See Comments)   Renal failure   Rosuvastatin    Other reaction(s): rhabdo. Other reaction(s): rhabdo. Other reaction(s): rhabdo.   Tapazole [methimazole] Itching, Rash        Medication List     STOP taking these medications    carvedilol 12.5 MG tablet Commonly known as: COREG   Moderna COVID-19 Vaccine 100 MCG/0.5ML injection Generic drug: COVID-19 mRNA vaccine (Moderna)       TAKE these medications    acetaminophen 500 MG tablet Commonly known as: TYLENOL Take 500-1,000 mg by mouth every 6 (six) hours as needed for moderate pain or headache.   amLODipine 10 MG tablet Commonly known as: NORVASC Take 10 mg by mouth daily.   aspirin EC  81 MG tablet Take 81 mg by mouth daily. Swallow whole.   ezetimibe 10 MG tablet Commonly known as: ZETIA Take 10 mg by mouth every evening.   ferrous sulfate 325 (65 FE) MG tablet Take 325 mg by mouth 2 (two) times daily.   Fish Oil 1200 MG Caps Take 1,200 mg by mouth 2 (two) times daily.   hydrocortisone cream 1 % Apply 1 application topically 2 (two) times daily as needed for itching.   levothyroxine 88 MCG tablet Commonly known as: SYNTHROID Take 88 mcg by mouth daily before breakfast.   metFORMIN 1000 MG tablet Commonly known as: GLUCOPHAGE Take 1 tablet by mouth 2 (two) times daily.   nitroGLYCERIN 0.4 MG SL tablet Commonly known as: NITROSTAT Place 1 tablet (0.4 mg total) under the tongue every 5 (five) minutes  as needed for chest pain.   omeprazole 20 MG capsule Commonly known as: PRILOSEC Take 20 mg by mouth 2 (two) times daily.   Praluent 150 MG/ML Soaj Generic drug: Alirocumab Inject 150 mg into the skin every 14 (fourteen) days.   Trijardy XR 12.5-2.07-998 MG Tb24 Generic drug: Empagliflozin-Linaglip-Metform Take 1 tablet by mouth in the morning and at bedtime.   vitamin B-12 1000 MCG tablet Commonly known as: CYANOCOBALAMIN Take 1,000 mcg by mouth daily.   Vitamin D3 50 MCG (2000 UT) Tabs Take 2,000 Units by mouth daily.        Follow-up Information     Deland Pretty, MD Follow up in 1 week(s).   Specialty: Internal Medicine Contact information: [address] [place] [address] [phone]         Minus Breeding, MD .   Specialty: Cardiology Contact information: [address] [place] [address] [phone]                Allergies  Allergen Reactions   Tramadol Nausea And Vomiting and Other (See Comments)    "got sick to my stomach and was throwing up blood; it put me in the hospital")   Lisinopril Rash and Other (See Comments)    Renal failure    Rosuvastatin     Other reaction(s): rhabdo. Other reaction(s): rhabdo. Other reaction(s): rhabdo.   Tapazole [Methimazole] Itching and Rash    Consultations: None   Procedures/Studies: DG Chest 2 View  Result Date: 12/22/2020 CLINICAL DATA:  Chest pain. EXAM: CHEST - 2 VIEW COMPARISON:  Chest CT 07/27/2020.  Chest x-ray 08/22/2019 FINDINGS: Left upper lobectomy changes are again seen. There is some linear atelectasis or scarring in the left lung base. There is no focal lung infiltrate. Costophrenic angles are clear. There is no pneumothorax. Cardiomediastinal silhouette is within normal limits. There are surgical clips in the bilateral axilla. There is a healed left seventh rib fracture. IMPRESSION: No active cardiopulmonary disease. Electronically Signed   By: Ronney Asters M.D.   On: 12/22/2020 21:53     Discharge Exam: Vitals:   12/23/20 0722 12/23/20 0800  BP:  (!) 169/72  Pulse:  88  Resp:  17  Temp: 97.9 F (36.6 C)   SpO2:  99%   Vitals:   12/23/20 0702 12/23/20 0715 12/23/20 0722 12/23/20 0800  BP: (!) 159/76 (!) 156/67  (!) 169/72  Pulse: 72 71  88  Resp: 14 12  17   Temp:   97.9 F (36.6 C)   TempSrc:   Oral   SpO2: 98% 95%  99%  Weight:      Height:  General: Pt is alert, awake, not in acute distress Cardiovascular: RRR, S1/S2 +, no rubs, no gallops Respiratory: CTA bilaterally, no wheezing, no rhonchi Abdominal: Soft, NT, ND, bowel sounds + Extremities: no edema, no cyanosis    The results of significant diagnostics from this hospitalization (including imaging, microbiology, ancillary and laboratory) are listed below for reference.     Microbiology: Recent Results (from the past 240 hour(s))  Resp Panel by RT-PCR (Flu A&B, Covid) Nasopharyngeal Swab     Status: None   Collection Time: 12/23/20  3:37 AM   Specimen: Nasopharyngeal Swab; Nasopharyngeal(NP) swabs in vial transport medium  Result Value Ref Range Status   SARS Coronavirus 2 by RT PCR NEGATIVE NEGATIVE Final    Comment: (NOTE) SARS-CoV-2 target nucleic acids are NOT DETECTED.  The SARS-CoV-2 RNA is generally detectable in upper respiratory specimens during the acute phase of infection. The lowest concentration of SARS-CoV-2 viral copies this assay can detect is 138 copies/mL. A negative result does not preclude SARS-Cov-2 infection and should not be used as the sole basis for treatment or other patient management decisions. A negative result may occur with  improper specimen collection/handling, submission of specimen other than nasopharyngeal swab, presence of viral mutation(s) within the areas targeted by this assay, and inadequate number of viral copies(<138 copies/mL). A negative result must be combined with clinical observations, patient  history, and epidemiological information. The expected result is Negative.  Fact Sheet for Patients:  EntrepreneurPulse.com.au  Fact Sheet for Healthcare Providers:  IncredibleEmployment.be  This test is no t yet approved or cleared by the [place] FDA and  has been authorized for detection and/or diagnosis of SARS-CoV-2 by FDA under an Emergency Use Authorization (EUA). This EUA will remain  in effect (meaning this test can be used) for the duration of the COVID-19 declaration under Section 564(b)(1) of the Act, 21 U.S.C.section 360bbb-3(b)(1), unless the authorization is terminated  or revoked sooner.       Influenza A by PCR NEGATIVE NEGATIVE Final   Influenza B by PCR NEGATIVE NEGATIVE Final    Comment: (NOTE) The Xpert Xpress SARS-CoV-2/FLU/RSV plus assay is intended as an aid in the diagnosis of influenza from Nasopharyngeal swab specimens and should not be used as a sole basis for treatment. Nasal washings and aspirates are unacceptable for Xpert Xpress SARS-CoV-2/FLU/RSV testing.  Fact Sheet for Patients: EntrepreneurPulse.com.au  Fact Sheet for Healthcare Providers: IncredibleEmployment.be  This test is not yet approved or cleared by the [place] FDA and has been authorized for detection and/or diagnosis of SARS-CoV-2 by FDA under an Emergency Use Authorization (EUA). This EUA will remain in effect (meaning this test can be used) for the duration of the COVID-19 declaration under Section 564(b)(1) of the Act, 21 U.S.C. section 360bbb-3(b)(1), unless the authorization is terminated or revoked.  Performed at [facility], [place] [address], [place], [place] [address]      Labs: BNP (last 3 results) No results for input(s): BNP in the last 8760 hours. Basic Metabolic Panel: Recent Labs  Lab 12/22/20 2141 12/23/20 0232  NA 138 139  K 3.9 3.9  CL 102 105  CO2 21* 22   GLUCOSE 123* 119*  BUN 24* 25*  CREATININE 1.37* 1.27*  CALCIUM 8.5* 8.4*  MG  --  1.5*   Liver Function Tests: Recent Labs  Lab 12/22/20 2141 12/23/20 0232  AST 23 18  ALT 18 16  ALKPHOS 68 65  BILITOT 0.4 0.3  PROT 7.4 6.9  ALBUMIN  3.5 3.2*   Recent Labs  Lab 12/22/20 2141  LIPASE 87*   No results for input(s): AMMONIA in the last 168 hours. CBC: Recent Labs  Lab 12/22/20 2141 12/23/20 0232  WBC 7.5 6.4  NEUTROABS 4.9  --   HGB 11.1* 10.8*  HCT 35.1* 34.3*  MCV 86.2 86.8  PLT 320 301   Cardiac Enzymes: No results for input(s): CKTOTAL, CKMB, CKMBINDEX, TROPONINI in the last 168 hours. BNP: Invalid input(s): POCBNP CBG: Recent Labs  Lab 12/23/20 0755  GLUCAP 103*   D-Dimer No results for input(s): DDIMER in the last 72 hours. Hgb A1c No results for input(s): HGBA1C in the last 72 hours. Lipid Profile No results for input(s): CHOL, HDL, LDLCALC, TRIG, CHOLHDL, LDLDIRECT in the last 72 hours. Thyroid function studies No results for input(s): TSH, T4TOTAL, T3FREE, THYROIDAB in the last 72 hours.  Invalid input(s): FREET3 Anemia work up No results for input(s): VITAMINB12, FOLATE, FERRITIN, TIBC, IRON, RETICCTPCT in the last 72 hours. Urinalysis    Component Value Date/Time   COLORURINE STRAW (A) 08/22/2019 2033   APPEARANCEUR CLEAR 08/22/2019 2033   LABSPEC 1.010 08/22/2019 2033   PHURINE 6.0 08/22/2019 2033   GLUCOSEU >=500 (A) 08/22/2019 2033   HGBUR LARGE (A) 08/22/2019 2033   BILIRUBINUR NEGATIVE 08/22/2019 2033   KETONESUR NEGATIVE 08/22/2019 2033   PROTEINUR 100 (A) 08/22/2019 2033   UROBILINOGEN 1.0 02/07/2008 1645   NITRITE NEGATIVE 08/22/2019 2033   LEUKOCYTESUR NEGATIVE 08/22/2019 2033   Sepsis Labs Invalid input(s): PROCALCITONIN,  WBC,  LACTICIDVEN Microbiology Recent Results (from the past 240 hour(s))  Resp Panel by RT-PCR (Flu A&B, Covid) Nasopharyngeal Swab     Status: None   Collection Time: 12/23/20  3:37 AM   Specimen:  Nasopharyngeal Swab; Nasopharyngeal(NP) swabs in vial transport medium  Result Value Ref Range Status   SARS Coronavirus 2 by RT PCR NEGATIVE NEGATIVE Final    Comment: (NOTE) SARS-CoV-2 target nucleic acids are NOT DETECTED.  The SARS-CoV-2 RNA is generally detectable in upper respiratory specimens during the acute phase of infection. The lowest concentration of SARS-CoV-2 viral copies this assay can detect is 138 copies/mL. A negative result does not preclude SARS-Cov-2 infection and should not be used as the sole basis for treatment or other patient management decisions. A negative result may occur with  improper specimen collection/handling, submission of specimen other than nasopharyngeal swab, presence of viral mutation(s) within the areas targeted by this assay, and inadequate number of viral copies(<138 copies/mL). A negative result must be combined with clinical observations, patient history, and epidemiological information. The expected result is Negative.  Fact Sheet for Patients:  EntrepreneurPulse.com.au  Fact Sheet for Healthcare Providers:  IncredibleEmployment.be  This test is no t yet approved or cleared by the [place] FDA and  has been authorized for detection and/or diagnosis of SARS-CoV-2 by FDA under an Emergency Use Authorization (EUA). This EUA will remain  in effect (meaning this test can be used) for the duration of the COVID-19 declaration under Section 564(b)(1) of the Act, 21 U.S.C.section 360bbb-3(b)(1), unless the authorization is terminated  or revoked sooner.       Influenza A by PCR NEGATIVE NEGATIVE Final   Influenza B by PCR NEGATIVE NEGATIVE Final    Comment: (NOTE) The Xpert Xpress SARS-CoV-2/FLU/RSV plus assay is intended as an aid in the diagnosis of influenza from Nasopharyngeal swab specimens and should not be used as a sole basis for treatment. Nasal washings and aspirates are unacceptable for  Xpert Xpress SARS-CoV-2/FLU/RSV testing.  Fact Sheet for Patients: EntrepreneurPulse.com.au  Fact Sheet for Healthcare Providers: IncredibleEmployment.be  This test is not yet approved or cleared by the [place] FDA and has been authorized for detection and/or diagnosis of SARS-CoV-2 by FDA under an Emergency Use Authorization (EUA). This EUA will remain in effect (meaning this test can be used) for the duration of the COVID-19 declaration under Section 564(b)(1) of the Act, 21 U.S.C. section 360bbb-3(b)(1), unless the authorization is terminated or revoked.  Performed at [facility], [place] [address], [place], [place] [address]      Time coordinating discharge: Over 30 minutes  SIGNED:   Darliss Cheney, MD  [facility] 12/23/2020, 8:51 AM  If 7PM-7AM, please contact night-coverage [email or web address]

## 2021-01-26 DIAGNOSIS — C3490 Malignant neoplasm of unspecified part of unspecified bronchus or lung: Secondary | ICD-10-CM | POA: Diagnosis not present

## 2021-01-26 DIAGNOSIS — N179 Acute kidney failure, unspecified: Secondary | ICD-10-CM | POA: Diagnosis not present

## 2021-01-26 DIAGNOSIS — I129 Hypertensive chronic kidney disease with stage 1 through stage 4 chronic kidney disease, or unspecified chronic kidney disease: Secondary | ICD-10-CM | POA: Diagnosis not present

## 2021-01-26 DIAGNOSIS — C50919 Malignant neoplasm of unspecified site of unspecified female breast: Secondary | ICD-10-CM | POA: Diagnosis not present

## 2021-01-26 DIAGNOSIS — D631 Anemia in chronic kidney disease: Secondary | ICD-10-CM | POA: Diagnosis not present

## 2021-01-26 DIAGNOSIS — R809 Proteinuria, unspecified: Secondary | ICD-10-CM | POA: Diagnosis not present

## 2021-01-26 DIAGNOSIS — N183 Chronic kidney disease, stage 3 unspecified: Secondary | ICD-10-CM | POA: Diagnosis not present

## 2021-01-26 DIAGNOSIS — E039 Hypothyroidism, unspecified: Secondary | ICD-10-CM | POA: Diagnosis not present

## 2021-01-26 DIAGNOSIS — N390 Urinary tract infection, site not specified: Secondary | ICD-10-CM | POA: Diagnosis not present

## 2021-01-26 DIAGNOSIS — E1122 Type 2 diabetes mellitus with diabetic chronic kidney disease: Secondary | ICD-10-CM | POA: Diagnosis not present

## 2021-01-31 ENCOUNTER — Inpatient Hospital Stay: Payer: Medicare Other | Attending: Internal Medicine

## 2021-01-31 ENCOUNTER — Ambulatory Visit (HOSPITAL_COMMUNITY)
Admission: RE | Admit: 2021-01-31 | Discharge: 2021-01-31 | Disposition: A | Discharge status: Home / Self Care | Payer: Medicare Other | Source: Ambulatory Visit | Attending: Internal Medicine | Admitting: Internal Medicine

## 2021-01-31 ENCOUNTER — Encounter (HOSPITAL_COMMUNITY): Payer: Self-pay

## 2021-01-31 ENCOUNTER — Other Ambulatory Visit: Payer: Self-pay

## 2021-01-31 DIAGNOSIS — M1990 Unspecified osteoarthritis, unspecified site: Secondary | ICD-10-CM | POA: Insufficient documentation

## 2021-01-31 DIAGNOSIS — I252 Old myocardial infarction: Secondary | ICD-10-CM | POA: Insufficient documentation

## 2021-01-31 DIAGNOSIS — Z9012 Acquired absence of left breast and nipple: Secondary | ICD-10-CM | POA: Insufficient documentation

## 2021-01-31 DIAGNOSIS — I2510 Atherosclerotic heart disease of native coronary artery without angina pectoris: Secondary | ICD-10-CM | POA: Insufficient documentation

## 2021-01-31 DIAGNOSIS — C3490 Malignant neoplasm of unspecified part of unspecified bronchus or lung: Secondary | ICD-10-CM | POA: Insufficient documentation

## 2021-01-31 DIAGNOSIS — K449 Diaphragmatic hernia without obstruction or gangrene: Secondary | ICD-10-CM | POA: Insufficient documentation

## 2021-01-31 DIAGNOSIS — Z888 Allergy status to other drugs, medicaments and biological substances status: Secondary | ICD-10-CM | POA: Insufficient documentation

## 2021-01-31 DIAGNOSIS — R079 Chest pain, unspecified: Secondary | ICD-10-CM | POA: Insufficient documentation

## 2021-01-31 DIAGNOSIS — Z885 Allergy status to narcotic agent status: Secondary | ICD-10-CM | POA: Insufficient documentation

## 2021-01-31 DIAGNOSIS — Z79899 Other long term (current) drug therapy: Secondary | ICD-10-CM | POA: Insufficient documentation

## 2021-01-31 DIAGNOSIS — C3412 Malignant neoplasm of upper lobe, left bronchus or lung: Secondary | ICD-10-CM | POA: Insufficient documentation

## 2021-01-31 DIAGNOSIS — I700 Atherosclerosis of aorta: Secondary | ICD-10-CM | POA: Insufficient documentation

## 2021-01-31 DIAGNOSIS — E119 Type 2 diabetes mellitus without complications: Secondary | ICD-10-CM | POA: Insufficient documentation

## 2021-01-31 DIAGNOSIS — Z902 Acquired absence of lung [part of]: Secondary | ICD-10-CM | POA: Insufficient documentation

## 2021-01-31 DIAGNOSIS — I10 Essential (primary) hypertension: Secondary | ICD-10-CM | POA: Insufficient documentation

## 2021-01-31 DIAGNOSIS — Z923 Personal history of irradiation: Secondary | ICD-10-CM | POA: Insufficient documentation

## 2021-01-31 LAB — CBC WITH DIFFERENTIAL (CANCER CENTER ONLY)
Abs Immature Granulocytes: 0.02 10*3/uL (ref 0.00–0.07)
Basophils Absolute: 0 10*3/uL (ref 0.0–0.1)
Basophils Relative: 0 %
Eosinophils Absolute: 0.1 10*3/uL (ref 0.0–0.5)
Eosinophils Relative: 1 %
HCT: 34.3 % — ABNORMAL LOW (ref 36.0–46.0)
Hemoglobin: 10.9 g/dL — ABNORMAL LOW (ref 12.0–15.0)
Immature Granulocytes: 0 %
Lymphocytes Relative: 23 %
Lymphs Abs: 1.4 10*3/uL (ref 0.7–4.0)
MCH: 26.9 pg (ref 26.0–34.0)
MCHC: 31.8 g/dL (ref 30.0–36.0)
MCV: 84.7 fL (ref 80.0–100.0)
Monocytes Absolute: 0.4 10*3/uL (ref 0.1–1.0)
Monocytes Relative: 7 %
Neutro Abs: 4.1 10*3/uL (ref 1.7–7.7)
Neutrophils Relative %: 69 %
Platelet Count: 340 10*3/uL (ref 150–400)
RBC: 4.05 MIL/uL (ref 3.87–5.11)
RDW: 15.9 % — ABNORMAL HIGH (ref 11.5–15.5)
WBC Count: 6 10*3/uL (ref 4.0–10.5)
nRBC: 0 % (ref 0.0–0.2)

## 2021-01-31 LAB — CMP (CANCER CENTER ONLY)
ALT: 9 U/L (ref 0–44)
AST: 15 U/L (ref 15–41)
Albumin: 3.7 g/dL (ref 3.5–5.0)
Alkaline Phosphatase: 66 U/L (ref 38–126)
Anion gap: 12 (ref 5–15)
BUN: 28 mg/dL — ABNORMAL HIGH (ref 8–23)
CO2: 19 mmol/L — ABNORMAL LOW (ref 22–32)
Calcium: 8.8 mg/dL — ABNORMAL LOW (ref 8.9–10.3)
Chloride: 111 mmol/L (ref 98–111)
Creatinine: 1.29 mg/dL — ABNORMAL HIGH (ref 0.44–1.00)
GFR, Estimated: 42 mL/min — ABNORMAL LOW (ref >60)
Glucose, Bld: 112 mg/dL — ABNORMAL HIGH (ref 70–99)
Potassium: 3.8 mmol/L (ref 3.5–5.1)
Sodium: 142 mmol/L (ref 135–145)
Total Bilirubin: 0.4 mg/dL (ref 0.3–1.2)
Total Protein: 7.8 g/dL (ref 6.5–8.1)

## 2021-02-02 ENCOUNTER — Encounter: Payer: Self-pay | Admitting: Internal Medicine

## 2021-02-02 ENCOUNTER — Other Ambulatory Visit: Payer: Self-pay

## 2021-02-02 ENCOUNTER — Inpatient Hospital Stay (HOSPITAL_BASED_OUTPATIENT_CLINIC_OR_DEPARTMENT_OTHER): Payer: Medicare Other | Admitting: Internal Medicine

## 2021-02-02 VITALS — BP 147/65 | HR 75 | Temp 96.8°F | Resp 19 | Ht 61.0 in | Wt 153.4 lb

## 2021-02-02 DIAGNOSIS — M1990 Unspecified osteoarthritis, unspecified site: Secondary | ICD-10-CM | POA: Diagnosis not present

## 2021-02-02 DIAGNOSIS — R079 Chest pain, unspecified: Secondary | ICD-10-CM | POA: Diagnosis not present

## 2021-02-02 DIAGNOSIS — C3490 Malignant neoplasm of unspecified part of unspecified bronchus or lung: Secondary | ICD-10-CM

## 2021-02-02 DIAGNOSIS — Z885 Allergy status to narcotic agent status: Secondary | ICD-10-CM | POA: Diagnosis not present

## 2021-02-02 DIAGNOSIS — C3492 Malignant neoplasm of unspecified part of left bronchus or lung: Secondary | ICD-10-CM

## 2021-02-02 DIAGNOSIS — Z902 Acquired absence of lung [part of]: Secondary | ICD-10-CM | POA: Diagnosis not present

## 2021-02-02 DIAGNOSIS — K449 Diaphragmatic hernia without obstruction or gangrene: Secondary | ICD-10-CM | POA: Diagnosis not present

## 2021-02-02 DIAGNOSIS — Z888 Allergy status to other drugs, medicaments and biological substances status: Secondary | ICD-10-CM | POA: Diagnosis not present

## 2021-02-02 DIAGNOSIS — E119 Type 2 diabetes mellitus without complications: Secondary | ICD-10-CM | POA: Diagnosis not present

## 2021-02-02 DIAGNOSIS — Z923 Personal history of irradiation: Secondary | ICD-10-CM | POA: Diagnosis not present

## 2021-02-02 DIAGNOSIS — C3412 Malignant neoplasm of upper lobe, left bronchus or lung: Secondary | ICD-10-CM | POA: Diagnosis not present

## 2021-02-02 DIAGNOSIS — Z79899 Other long term (current) drug therapy: Secondary | ICD-10-CM | POA: Diagnosis not present

## 2021-02-02 DIAGNOSIS — Z9012 Acquired absence of left breast and nipple: Secondary | ICD-10-CM | POA: Diagnosis not present

## 2021-02-02 DIAGNOSIS — I700 Atherosclerosis of aorta: Secondary | ICD-10-CM | POA: Diagnosis not present

## 2021-02-02 DIAGNOSIS — I2510 Atherosclerotic heart disease of native coronary artery without angina pectoris: Secondary | ICD-10-CM | POA: Diagnosis not present

## 2021-02-02 DIAGNOSIS — I252 Old myocardial infarction: Secondary | ICD-10-CM | POA: Diagnosis not present

## 2021-02-02 DIAGNOSIS — I10 Essential (primary) hypertension: Secondary | ICD-10-CM | POA: Diagnosis not present

## 2021-02-02 NOTE — Progress Notes (Signed)
[place] [phone]   Fax:([phone]) [place], MD [address] [place] [address]  DIAGNOSIS:  Stage IA (T1 a, N0, M0) non-small cell lung cancer, adenocarcinoma presented with left upper lobe lung nodule.  PRIOR THERAPY: status post left upper lobectomy with lymph node dissection under the care of Dr. Roxan Hockey on June 04, 2017.  CURRENT THERAPY: Observation.  INTERVAL HISTORY: Karen West 80 y.o. female returns to the clinic today for 89-month follow-up visit.  The patient is feeling fine today with no concerning complaints except for intermittent pain on the left side of the chest from the surgical scar.  She denied having any current shortness of breath, cough or hemoptysis.  She denied having any fever or chills.  She has no nausea, vomiting, diarrhea or constipation.  She has no headache or visual changes.  She had repeat CT scan of the chest performed recently and she is here for evaluation and discussion of her risk her results.   MEDICAL HISTORY: Past Medical History:  Diagnosis Date   Atherosclerosis of aorta ([place])    a. 01/2017/03/2017 - noted on high res chest CTs.   Breast cancer ([place])    a. Bilateral --> s/p left mastectomy   Carotid artery disease ([place])    a. 0/2409 w/ 73-53% LICA stenosis and <29% RICA stenosis; b. 10/2015 Carotid U/S: < 50% BICA stenosis   Chest pain    a. 09/2011 MV: EF 68%, no ischemia/infarct.   Chronic anemia    Chronic headaches    denies   Coronary artery calcification seen on CT scan    a. 01/2017 High res CT: atherosclerotic calcification of the arterial vascularture, including severe involvement of the coronary arteries; b. 03/2017 CT Chest: coronary and Ao atheroscelrosis.   GERD (gastroesophageal reflux disease)    History of echocardiogram    a. 09/2011 Echo: EF 55-60%, no rwma, triv AI, PASP 69mmHg.   Hyperlipidemia    Hypertension     Hyperthyroidism    Left upper lobe pulmonary nodule    a. 02/2017 PET: slowing enlarging 1.7cm LUL nodule w/ low-grade metabolic activity; b. 11/2424 Bronch-->mucinous adenocarcinoma;  c. 05/2017 s/p VATS.   Multinodular goiter    a. 02/2017 PET scan- Hypermetabolic nodule;  b. 10/3417 s/p thyroidectomy   Myocardial infarction ([place]) 06/2019   NONSTEMI   Obesity    OSA on CPAP    cpap   Osteoarthritis    Personal history of radiation therapy 1999   Right bundle branch block    Type II or unspecified type diabetes mellitus without mention of complication, uncontrolled     ALLERGIES:  is allergic to tramadol, lisinopril, rosuvastatin, and tapazole [methimazole].  MEDICATIONS:  Current Outpatient Medications  Medication Sig Dispense Refill   Alirocumab (PRALUENT) 150 MG/ML SOAJ Inject 150 mg into the skin every 14 (fourteen) days. 1 mL 0   amLODipine (NORVASC) 10 MG tablet Take 10 mg by mouth daily.     aspirin EC 81 MG tablet Take 81 mg by mouth daily. Swallow whole.     Cholecalciferol (VITAMIN D3) 50 MCG (2000 UT) TABS Take 2,000 Units by mouth daily.     Empagliflozin-Linaglip-Metform (TRIJARDY XR) 12.5-2.07-998 MG TB24 Take 1 tablet by mouth in the morning and at bedtime.     ezetimibe (ZETIA) 10 MG tablet Take 10 mg by mouth every evening.     ferrous sulfate 325 (65 FE) MG  tablet Take 325 mg by mouth 2 (two) times daily.     hydrocortisone cream 1 % Apply 1 application topically 2 (two) times daily as needed for itching.      levothyroxine (SYNTHROID, LEVOTHROID) 88 MCG tablet Take 88 mcg by mouth daily before breakfast.   5   metoprolol tartrate (LOPRESSOR) 25 MG tablet Take 25 mg by mouth 2 (two) times daily.     nitroGLYCERIN (NITROSTAT) 0.4 MG SL tablet Place 1 tablet (0.4 mg total) under the tongue every 5 (five) minutes as needed for chest pain. 25 tablet 3   Omega-3 Fatty Acids (FISH OIL) 1200 MG CAPS Take 1,200 mg by mouth 2 (two) times daily.      omeprazole (PRILOSEC) 20 MG  capsule Take 20 mg by mouth 2 (two) times daily.      vitamin B-12 (CYANOCOBALAMIN) 1000 MCG tablet Take 1,000 mcg by mouth daily.     No current facility-administered medications for this visit.    SURGICAL HISTORY:  Past Surgical History:  Procedure Laterality Date   BREAST BIOPSY  1993; 1995; 2000   left; right; left   BREAST EXCISIONAL BIOPSY Right pt unsure   CARDIAC CATHETERIZATION     CORONARY STENT INTERVENTION N/A 07/03/2019   Procedure: CORONARY STENT INTERVENTION;  Surgeon: Troy Sine, MD;  Location: [facility];  Service: Cardiovascular;  Laterality: N/A;  RCA   ESOPHAGOGASTRODUODENOSCOPY (EGD) WITH PROPOFOL N/A 07/16/2020   Procedure: ESOPHAGOGASTRODUODENOSCOPY (EGD) WITH PROPOFOL;  Surgeon: Carol Ada, MD;  Location: [facility];  Service: Endoscopy;  Laterality: N/A;   FINE NEEDLE ASPIRATION N/A 07/16/2020   Procedure: FINE NEEDLE ASPIRATION (FNA) LINEAR;  Surgeon: Carol Ada, MD;  Location: [facility];  Service: Endoscopy;  Laterality: N/A;   LEFT HEART CATH AND CORONARY ANGIOGRAPHY N/A 07/03/2019   Procedure: LEFT HEART CATH AND CORONARY ANGIOGRAPHY;  Surgeon: Troy Sine, MD;  Location: [facility];  Service: Cardiovascular;  Laterality: N/A;   MASTECTOMY Left 2000   left   THYROIDECTOMY  05/10/2017   VIDEO BRONCHOSCOPY WITH ENDOBRONCHIAL NAVIGATION (N/A)   THYROIDECTOMY N/A 05/10/2017   Procedure: TOTAL THYROIDECTOMY;  Surgeon: Armandina Gemma, MD;  Location: [facility];  Service: General;  Laterality: N/A;   UPPER ESOPHAGEAL ENDOSCOPIC ULTRASOUND (EUS) N/A 07/16/2020   Procedure: UPPER ESOPHAGEAL ENDOSCOPIC ULTRASOUND (EUS);  Surgeon: Carol Ada, MD;  Location: [facility];  Service: Endoscopy;  Laterality: N/A;   VIDEO ASSISTED THORACOSCOPY (VATS)/ LOBECTOMY Left 06/04/2017   Procedure: VIDEO ASSISTED THORACOSCOPY (VATS)/LEFT UPPER LOBECTOMY;  Surgeon: [name], MD;  Location: [facility];  Service: Thoracic;  Laterality: Left;   VIDEO  BRONCHOSCOPY WITH ENDOBRONCHIAL NAVIGATION N/A 05/10/2017   Procedure: VIDEO BRONCHOSCOPY WITH ENDOBRONCHIAL NAVIGATION;  Surgeon: [name], MD;  Location: [facility];  Service: Thoracic;  Laterality: N/A;   VIDEO BRONCHOSCOPY WITH ENDOBRONCHIAL ULTRASOUND N/A 06/04/2017   Procedure: VIDEO BRONCHOSCOPY WITH ENDOBRONCHIAL ULTRASOUND;  Surgeon: [name], MD;  Location: [facility];  Service: Thoracic;  Laterality: N/A;    REVIEW OF SYSTEMS:  A comprehensive review of systems was negative except for: Respiratory: positive for pleurisy/chest pain   PHYSICAL EXAMINATION: General appearance: alert, cooperative, and no distress Head: Normocephalic, without obvious abnormality, atraumatic Neck: no adenopathy, no JVD, supple, symmetrical, trachea midline, and thyroid not enlarged, symmetric, no tenderness/mass/nodules Lymph nodes: Cervical, supraclavicular, and axillary nodes normal. Resp: clear to auscultation bilaterally Back: symmetric, no curvature. ROM normal. No CVA tenderness. Cardio: regular rate and rhythm, S1, S2 normal,  no murmur, click, rub or gallop GI: soft, non-tender; bowel sounds normal; no masses,  no organomegaly Extremities: extremities normal, atraumatic, no cyanosis or edema  ECOG PERFORMANCE STATUS: 1 - Symptomatic but completely ambulatory  Blood pressure (!) 147/65, pulse 75, temperature (!) 96.8 F (36 C), temperature source Tympanic, resp. rate 19, height 5\' 1"  (1.549 m), weight 153 lb 6.4 oz (69.6 kg), SpO2 100 %.  LABORATORY DATA: Lab Results  Component Value Date   WBC 6.0 01/31/2021   HGB 10.9 (L) 01/31/2021   HCT 34.3 (L) 01/31/2021   MCV 84.7 01/31/2021   PLT 340 01/31/2021      Chemistry      Component Value Date/Time   NA 142 01/31/2021 0859   NA 138 07/21/2019 1033   K 3.8 01/31/2021 0859   CL 111 01/31/2021 0859   CO2 19 (L) 01/31/2021 0859   BUN 28 (H) 01/31/2021 0859   BUN 25 07/21/2019 1033   CREATININE 1.29 (H) 01/31/2021 0859    CREATININE 1.16 (H) 09/11/2017 1241      Component Value Date/Time   CALCIUM 8.8 (L) 01/31/2021 0859   ALKPHOS 66 01/31/2021 0859   AST 15 01/31/2021 0859   ALT 9 01/31/2021 0859   BILITOT 0.4 01/31/2021 0859       RADIOGRAPHIC STUDIES: CT Chest Wo Contrast  Result Date: 01/31/2021 CLINICAL DATA:  Non-small lung cancer restaging, status post left upper lobectomy EXAM: CT CHEST WITHOUT CONTRAST TECHNIQUE: Multidetector CT imaging of the chest was performed following the standard protocol without IV contrast. COMPARISON:  CT chest, 07/27/2020, MR abdomen, 10/26/2020 FINDINGS: Cardiovascular: Aortic atherosclerosis. Normal heart size. Extensive three-vessel coronary artery calcifications and stents. No pericardial effusion. Mediastinum/Nodes: No enlarged mediastinal, hilar, or axillary lymph nodes. Small hiatal hernia. Thyroid gland, trachea, and esophagus demonstrate no significant findings. Lungs/Pleura: Redemonstrated postoperative findings of left upper lobectomy. Previously seen ground-glass opacity in the superior segment left lower lobe is resolved. No pleural effusion or pneumothorax. Upper Abdomen: No acute abnormality. Multiple lesions of the bilateral kidneys, particularly hyperdense lesions of the superior pole left kidney, previously characterized as hemorrhagic and proteinaceous cysts by MR. Musculoskeletal: No chest wall mass or suspicious bone lesions identified. Status post left mastectomy. IMPRESSION: 1. Redemonstrated postoperative findings of left upper lobectomy. No evidence of malignant recurrence or metastatic disease in the chest. 2. Previously seen ground-glass opacity in the superior segment left lower lobe is resolved, consistent with resolution of infection or inflammation. 3. Coronary artery disease. Aortic Atherosclerosis (ICD10-I70.0). Electronically Signed   By: Delanna Ahmadi M.D.   On: 01/31/2021 13:46     ASSESSMENT AND PLAN: This is a very pleasant 80 years old  African-American female with a stage Ia non-small cell lung cancer, adenocarcinoma status post left upper lobectomy with lymph node dissection in March 2019 under the care of Dr. Roxan Hockey. The patient has been in observation since her surgery in 2019 and she is feeling fine today. She had repeat CT scan of the chest performed recently.  I personally and independently reviewed the scans and discussed the results with the patient today. Her scan showed no concerning findings for disease recurrence or metastasis.  The previously noted groundglass opacity in the superior segment of the left lower lobe is resolved. I recommended for the patient to continue on observation with repeat CT scan of the chest in 1 year. The patient was advised to call immediately if she has any concerning symptoms in the interval. The patient voices understanding of current  disease status and treatment options and is in agreement with the current care plan. All questions were answered. The patient knows to call the clinic with any problems, questions or concerns. We can certainly see the patient much sooner if necessary.  Disclaimer: This note was dictated with voice recognition software. Similar sounding words can inadvertently be transcribed and may not be corrected upon review.

## 2021-02-04 ENCOUNTER — Ambulatory Visit (INDEPENDENT_AMBULATORY_CARE_PROVIDER_SITE_OTHER): Payer: Medicare Other | Admitting: Podiatry

## 2021-02-04 ENCOUNTER — Other Ambulatory Visit: Payer: Self-pay

## 2021-02-04 ENCOUNTER — Encounter: Payer: Self-pay | Admitting: Podiatry

## 2021-02-04 DIAGNOSIS — M79674 Pain in right toe(s): Secondary | ICD-10-CM

## 2021-02-04 DIAGNOSIS — M79675 Pain in left toe(s): Secondary | ICD-10-CM | POA: Diagnosis not present

## 2021-02-04 DIAGNOSIS — B351 Tinea unguium: Secondary | ICD-10-CM

## 2021-02-04 DIAGNOSIS — E0821 Diabetes mellitus due to underlying condition with diabetic nephropathy: Secondary | ICD-10-CM

## 2021-02-04 DIAGNOSIS — E1142 Type 2 diabetes mellitus with diabetic polyneuropathy: Secondary | ICD-10-CM | POA: Insufficient documentation

## 2021-02-08 NOTE — Progress Notes (Signed)
  Subjective:  Patient ID: Karen West, female    DOB: 1941/03/19,  MRN: [id number]  80 y.o. female presents preventative diabetic foot care and painful elongated mycotic toenails 1-5 bilaterally which are tender when wearing enclosed shoe gear. Pain is relieved with periodic professional debridement.  Patient states blood glucose was 92 mg/dl today.    PCP is Deland Pretty, MD , and last visit was 09/07/2020.  Allergies  Allergen Reactions   Tramadol Nausea And Vomiting and Other (See Comments)    "got sick to my stomach and was throwing up blood; it put me in the hospital")   Lisinopril Rash and Other (See Comments)    Renal failure    Rosuvastatin     Other reaction(s): rhabdo. Other reaction(s): rhabdo. Other reaction(s): rhabdo.   Tapazole [Methimazole] Itching and Rash    Review of Systems: Negative except as noted in the HPI.   Objective:  Vascular Examination: CFT immediate b/l. No edema. No pain with calf compression b/l. Skin temperature gradient WNL b/l. Palpable DP pulse(s) b/l LE. Nonpalpable PT pulse(s) b/l LE. Pedal hair absent. No edema noted b/l LE.  Neurological Examination: Sensation grossly intact b/l with 10 gram monofilament. Vibratory sensation intact b/l.   Dermatological Examination: Pedal skin with normal turgor, texture and tone b/l. Toenails 1-5 b/l thick, discolored, elongated with subungual debris and pain on dorsal palpation. No hyperkeratotic lesions noted b/l.   Musculoskeletal Examination: Muscle strength 5/5 to b/l LE. HAV with bunion deformity noted b/l LE. Hammertoe deformity noted 2-5 b/l.  Radiographs: None Assessment:   1. Pain due to onychomycosis of toenails of both feet   2. 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 No new orders. -Continue diabetic foot care principles: inspect feet daily, monitor glucose as recommended by PCP and/or Endocrinologist, and follow prescribed  diet per PCP, Endocrinologist and/or dietician. -Mycotic toenails 1-5 bilaterally were debrided in length and girth with sterile nail nippers and dremel without incident. -Patient/POA to call should there be question/concern in the interim.  Return in about 3 months (around 05/07/2021).  Marzetta Board, DPM

## 2021-02-14 DIAGNOSIS — D649 Anemia, unspecified: Secondary | ICD-10-CM | POA: Diagnosis not present

## 2021-02-14 DIAGNOSIS — E119 Type 2 diabetes mellitus without complications: Secondary | ICD-10-CM | POA: Diagnosis not present

## 2021-02-14 DIAGNOSIS — K862 Cyst of pancreas: Secondary | ICD-10-CM | POA: Diagnosis not present

## 2021-02-14 DIAGNOSIS — E039 Hypothyroidism, unspecified: Secondary | ICD-10-CM | POA: Diagnosis not present

## 2021-02-14 DIAGNOSIS — I10 Essential (primary) hypertension: Secondary | ICD-10-CM | POA: Diagnosis not present

## 2021-02-14 DIAGNOSIS — E785 Hyperlipidemia, unspecified: Secondary | ICD-10-CM | POA: Diagnosis not present

## 2021-02-14 DIAGNOSIS — N281 Cyst of kidney, acquired: Secondary | ICD-10-CM | POA: Diagnosis not present

## 2021-02-16 DIAGNOSIS — I10 Essential (primary) hypertension: Secondary | ICD-10-CM | POA: Diagnosis not present

## 2021-02-16 DIAGNOSIS — I252 Old myocardial infarction: Secondary | ICD-10-CM | POA: Diagnosis not present

## 2021-02-16 DIAGNOSIS — E039 Hypothyroidism, unspecified: Secondary | ICD-10-CM | POA: Diagnosis not present

## 2021-02-16 DIAGNOSIS — E785 Hyperlipidemia, unspecified: Secondary | ICD-10-CM | POA: Diagnosis not present

## 2021-02-16 DIAGNOSIS — E1121 Type 2 diabetes mellitus with diabetic nephropathy: Secondary | ICD-10-CM | POA: Diagnosis not present

## 2021-02-16 DIAGNOSIS — D631 Anemia in chronic kidney disease: Secondary | ICD-10-CM | POA: Diagnosis not present

## 2021-02-16 DIAGNOSIS — Z8739 Personal history of other diseases of the musculoskeletal system and connective tissue: Secondary | ICD-10-CM | POA: Diagnosis not present

## 2021-02-19 ENCOUNTER — Encounter: Payer: Self-pay | Admitting: Cardiology

## 2021-02-22 DIAGNOSIS — L729 Follicular cyst of the skin and subcutaneous tissue, unspecified: Secondary | ICD-10-CM | POA: Diagnosis not present

## 2021-02-22 DIAGNOSIS — K591 Functional diarrhea: Secondary | ICD-10-CM | POA: Diagnosis not present

## 2021-02-23 DIAGNOSIS — K610 Anal abscess: Secondary | ICD-10-CM | POA: Diagnosis not present

## 2021-02-23 DIAGNOSIS — R197 Diarrhea, unspecified: Secondary | ICD-10-CM | POA: Diagnosis not present

## 2021-02-23 DIAGNOSIS — K6289 Other specified diseases of anus and rectum: Secondary | ICD-10-CM | POA: Diagnosis not present

## 2021-02-24 ENCOUNTER — Telehealth: Payer: Self-pay | Admitting: *Deleted

## 2021-02-24 DIAGNOSIS — K610 Anal abscess: Secondary | ICD-10-CM | POA: Diagnosis not present

## 2021-02-24 NOTE — Telephone Encounter (Signed)
This encounter was created in error - please disregard.

## 2021-02-24 NOTE — Telephone Encounter (Signed)
Please call GI team to find out if patient needs to hold ASA for colonoscopy? Typically do not need to hold this. If so, will need to review with MD and procedure is already 5 days away.

## 2021-02-24 NOTE — Telephone Encounter (Signed)
   Name: Karen West  DOB: 12/02/1940  MRN: [id number]   Primary Cardiologist: Minus Breeding, MD  Chart reviewed as part of pre-operative protocol coverage. Patient was contacted 02/24/2021 in reference to pre-operative risk assessment for pending surgery as outlined below. Karen West was last seen by Dr. Percival Spanish 06/2020. She has history of CAD s/p PCI of RCA 06/2019 with residual disease treated medically, aortic atherosclerosis HTN, thyroid disease, OSA, RBBB amongst other hx outlined. In 06/2020 Dr. Percival Spanish discontinued Brilinta, to continue aspirin monotherapy. Per GI clarification, do not need to hold ASA for colonoscopy.  I reached out to patient for update on how she is doing. She was admitted overnight 12/2020 with chest pain x5 hours and ruled out. She has not had any chest pain since that time either at rest or with exertion. She does have chronic unchanged dyspnea on exertion. She does report her BP has been running higher and sent a fax for Dr. Percival Spanish to review - per 12/3 MyChart msg this is in Dr. Rosezella [place] box for review. Recommed further input from MD whether he advises additional BP management prior to colonoscopy in light of brief overnight admission for chest pain recently. Dr. Percival Spanish - please review and route response to [facility] PREOP (the pre-op pool). Thank you.   Charlie Pitter, PA-C 02/24/2021, 4:53 PM

## 2021-02-24 NOTE — Telephone Encounter (Signed)
S/w Caryl Pina at Dr. Ulyses Amor office. Confirmed pt does NOT need to stop ASA. Pt can remain on ASA. I will update the pre op provider.

## 2021-02-24 NOTE — Telephone Encounter (Signed)
   Karen West Pre-operative Risk Assessment    Patient Name: Karen West  DOB: January 01, 1941 MRN: [id number]    Request for surgical clearance:  What type of surgery is being performed? Colonoscopy  When is this surgery scheduled? 03/01/21  What type of clearance is required (medical clearance vs. Pharmacy clearance to hold med vs. Both)? Medical   Are there any medications that need to be held prior to surgery and how long?  Aspirin 81 mg  Practice name and name of physician performing surgery?  [facility] ; Karen West  What is the office phone number? [phone]   7.   What is the office fax number? [phone]   8.   Anesthesia type (None, local, MAC, general) ?  Propofol    Karen West 02/24/2021, 11:17 AM  _________________________________________________________________   (provider comments below)

## 2021-02-25 MED ORDER — METOPROLOL TARTRATE 50 MG PO TABS: ORAL_TABLET | ORAL | 4 refills | Status: DC

## 2021-02-25 NOTE — Telephone Encounter (Signed)
Called pt and notified that Dr Percival Spanish has increased her metoprolol tartrate to 50mg  in the am and 25mg  in the pm. All questions answered. New rx sent to requested pharmacy. Pt states that she thinks that she has enough for another month of what she has. Pt notified of new tablet dosage of 50mg . She is to take 1 tablet in the am and 1/2 tab in the pm. Pt will need to call pharmacy to fill when her current rx is done. Pt verbalizes understanding.

## 2021-02-25 NOTE — Telephone Encounter (Signed)
   Patient Name: Karen West  DOB: 1941/01/06 MRN: [id number]  Primary Cardiologist: Minus Breeding, MD  Chart revisited as part of pre-operative protocol coverage.   As below, I reached out yesterday to the patient to find out how she was doing. She had been admitted overnight 12/2020 with chest pain x5 hours and ruled out, arguing against cardiac etiology of pain. She had not had any chest pain since that time either at rest or with exertion. She does have chronic unchanged dyspnea on exertion. She felt back to herself. Colonoscopy is low risk procedure in general and per clinic's review with GI team yesterday, she will not have to stop aspirin for this procedure. Therefore, based on ACC/AHA guidelines, the patient would be at acceptable risk for the planned procedure without further cardiovascular testing. The patient was advised yesterday that if she were to develop new symptoms prior to surgery to contact our office to arrange for a follow-up visit, and she verbalized understanding.  After reviewing her most recent BP readings, Dr. Percival Spanish did recommend increasing her metoprolol to 50mg  in the AM and 25mg  in the PM. I called the patient to relay this recommendation but she did not answer. The phone number listed [phone]) does not go to VM, just rings incessantly. The mobile number listed goes to a VM that is not set up yet. Since pre-op team is otherwise finished with chart, I will route this message to the triage team to try again later to notify patient of recommended medication change and that we faxed her clearance to Dr. Benson Norway.  Will route this bundled recommendation to requesting provider via Epic fax function. Please call with questions.  Charlie Pitter, PA-C 02/25/2021, 4:14 PM

## 2021-03-01 ENCOUNTER — Other Ambulatory Visit: Payer: Self-pay | Admitting: *Deleted

## 2021-03-01 DIAGNOSIS — R197 Diarrhea, unspecified: Secondary | ICD-10-CM | POA: Diagnosis not present

## 2021-03-01 DIAGNOSIS — E7801 Familial hypercholesterolemia: Secondary | ICD-10-CM

## 2021-03-01 DIAGNOSIS — K6389 Other specified diseases of intestine: Secondary | ICD-10-CM | POA: Diagnosis not present

## 2021-03-01 DIAGNOSIS — K5730 Diverticulosis of large intestine without perforation or abscess without bleeding: Secondary | ICD-10-CM | POA: Diagnosis not present

## 2021-03-09 DIAGNOSIS — I252 Old myocardial infarction: Secondary | ICD-10-CM | POA: Diagnosis not present

## 2021-03-09 DIAGNOSIS — Z8739 Personal history of other diseases of the musculoskeletal system and connective tissue: Secondary | ICD-10-CM | POA: Diagnosis not present

## 2021-03-09 DIAGNOSIS — I10 Essential (primary) hypertension: Secondary | ICD-10-CM | POA: Diagnosis not present

## 2021-03-09 DIAGNOSIS — E1121 Type 2 diabetes mellitus with diabetic nephropathy: Secondary | ICD-10-CM | POA: Diagnosis not present

## 2021-03-09 DIAGNOSIS — E039 Hypothyroidism, unspecified: Secondary | ICD-10-CM | POA: Diagnosis not present

## 2021-03-09 DIAGNOSIS — D631 Anemia in chronic kidney disease: Secondary | ICD-10-CM | POA: Diagnosis not present

## 2021-03-09 DIAGNOSIS — E785 Hyperlipidemia, unspecified: Secondary | ICD-10-CM | POA: Diagnosis not present

## 2021-03-23 DIAGNOSIS — E039 Hypothyroidism, unspecified: Secondary | ICD-10-CM | POA: Diagnosis not present

## 2021-03-23 DIAGNOSIS — D631 Anemia in chronic kidney disease: Secondary | ICD-10-CM | POA: Diagnosis not present

## 2021-03-23 DIAGNOSIS — Z8739 Personal history of other diseases of the musculoskeletal system and connective tissue: Secondary | ICD-10-CM | POA: Diagnosis not present

## 2021-03-23 DIAGNOSIS — E785 Hyperlipidemia, unspecified: Secondary | ICD-10-CM | POA: Diagnosis not present

## 2021-03-23 DIAGNOSIS — I252 Old myocardial infarction: Secondary | ICD-10-CM | POA: Diagnosis not present

## 2021-03-23 DIAGNOSIS — I10 Essential (primary) hypertension: Secondary | ICD-10-CM | POA: Diagnosis not present

## 2021-03-23 DIAGNOSIS — E1121 Type 2 diabetes mellitus with diabetic nephropathy: Secondary | ICD-10-CM | POA: Diagnosis not present

## 2021-04-11 ENCOUNTER — Ambulatory Visit (INDEPENDENT_AMBULATORY_CARE_PROVIDER_SITE_OTHER): Payer: Medicare Other | Admitting: Internal Medicine

## 2021-04-11 ENCOUNTER — Other Ambulatory Visit: Payer: Self-pay

## 2021-04-11 ENCOUNTER — Encounter (HOSPITAL_BASED_OUTPATIENT_CLINIC_OR_DEPARTMENT_OTHER): Payer: Self-pay | Admitting: Internal Medicine

## 2021-04-11 VITALS — BP 140/68 | HR 79 | Ht 61.0 in | Wt 155.2 lb

## 2021-04-11 DIAGNOSIS — I779 Disorder of arteries and arterioles, unspecified: Secondary | ICD-10-CM

## 2021-04-11 DIAGNOSIS — I2510 Atherosclerotic heart disease of native coronary artery without angina pectoris: Secondary | ICD-10-CM | POA: Diagnosis not present

## 2021-04-11 DIAGNOSIS — G720 Drug-induced myopathy: Secondary | ICD-10-CM | POA: Diagnosis not present

## 2021-04-11 DIAGNOSIS — E7801 Familial hypercholesterolemia: Secondary | ICD-10-CM

## 2021-04-11 DIAGNOSIS — T466X5D Adverse effect of antihyperlipidemic and antiarteriosclerotic drugs, subsequent encounter: Secondary | ICD-10-CM

## 2021-04-11 NOTE — Patient Instructions (Signed)
Medication Instructions:  You could try Metamucil over-the-counter   *If you need a refill on your cardiac medications before your next appointment, please call your pharmacy*   Lab Work: FASTING lipid panel to check cholesterol in 1 year  If you have labs (blood work) drawn today and your tests are completely normal, you will receive your results only by: [place] (if you have MyChart) OR A paper copy in the mail If you have any lab test that is abnormal or we need to change your treatment, we will call you to review the results.   Testing/Procedures: NONE   Follow-Up: At [facility], you and your health needs are our priority.  As part of our continuing mission to provide you with exceptional heart care, we have created designated Provider Care Teams.  These Care Teams include your primary Cardiologist (physician) and Advanced Practice Providers (APPs -  Physician Assistants and Nurse Practitioners) who all work together to provide you with the care you need, when you need it.  We recommend signing up for the patient portal called "MyChart".  Sign up information is provided on this After Visit Summary.  MyChart is used to connect with patients for Virtual Visits (Telemedicine).  Patients are able to view lab/test results, encounter notes, upcoming appointments, etc.  Non-urgent messages can be sent to your provider as well.   To learn more about what you can do with MyChart, go to [email or web address].    Your next appointment:   12 months with Dr. Debara Pickett

## 2021-04-11 NOTE — Progress Notes (Addendum)
LIPID CLINIC CONSULT NOTE  Chief Complaint:  Follow-up dyslipidemia  Primary Care Physician: Deland Pretty, MD  Primary Cardiologist:  Minus Breeding, MD  HPI:  Karen West is a 81 y.o. female who is being seen today for the evaluation of dyslipidemia at the request of Coletta Memos, NP.  This is a 81 year old female kindly referred for management of dyslipidemia.  Karen West unfortunately coronary artery disease and had PCI in April 2021 to the right coronary artery.  Despite a difficult procedure she is done pretty well with this.  At that time she was ready on 20 mg of rosuvastatin and that was increased up to 40 mg.  Recently however though she was hospitalized with diarrhea, dehydration, markedly elevated liver enzymes and high CK which is suggestive of a rhabdomyolysis.  She had significant bilateral leg pain and weakness with CKs in the thousands.  Subsequently they have improved and labs from her PCP today show normal values with a CK of 62 and an AST and ALT of 15 and 18 respectively.  Her last lipid profile showed total cholesterol 158, triglycerides 109, HDL 54 and LDL of 82.  Subsequently cholesterol was retested in June 2021 which showed total cholesterol 275, triglycerides 135, HDL 51 and LDL of 199.  Findings are very significant and suggestive of familial hyperlipidemia.  This would be consistent with her aggressive coronary artery disease and bilateral carotid artery disease as well as atherosclerosis of the aorta.  02/05/2020  Karen West returns today for follow-up.  Overall she seems to be doing well.  We did get repeat lipid numbers from her PCP.  This does show a significant improvement in her cholesterol.  Total is now 158, triglycerides 99, HDL 52 and LDL of 86.  She said she also had had repeat liver enzymes tested which she would provide to [place] but were not available today.  This cholesterol is improved significantly from total cholesterol as high as 275 in June  and LDL of 196 in August.  She also noted recently she has been having some sharp and atypical type chest pains.  She wanted [place] to check an EKG today.  04/11/2021  Karen West is seen today in follow-up.  She denies any chest pain or worsening shortness of breath.  Lipids from her primary care provider show persistent increase in her cholesterol.  Total is gone up from 166 - 182 - 193.  LDL according was increased from 99, 105, 111.  She reports compliance with her PCSK9 inhibitor.  She says her diet has not changed in fact her A1c is down 0.2% with dietary changes.  She reports her blood pressure is still not ideally controlled with systolics between 884 and 160.  She cannot tolerate increases in the dose of her carvedilol because she said she thought it caused her diarrhea.  Recently she underwent a colonoscopy by Dr. Benson Norway and has follow-up with him on Wednesday.  PMHx:  Past Medical History:  Diagnosis Date   Atherosclerosis of aorta ([place])    a. 01/2017/03/2017 - noted on high res chest CTs.   Breast cancer ([place])    a. Bilateral --> s/p left mastectomy   Carotid artery disease ([place])    a. 03/6604 w/ 30-16% LICA stenosis and <01% RICA stenosis; b. 10/2015 Carotid U/S: < 50% BICA stenosis   Chest pain    a. 09/2011 MV: EF 68%, no ischemia/infarct.   Chronic anemia    Chronic headaches    denies  Coronary artery calcification seen on CT scan    a. 01/2017 High res CT: atherosclerotic calcification of the arterial vascularture, including severe involvement of the coronary arteries; b. 03/2017 CT Chest: coronary and Ao atheroscelrosis.   GERD (gastroesophageal reflux disease)    History of echocardiogram    a. 09/2011 Echo: EF 55-60%, no rwma, triv AI, PASP 79mmHg.   Hyperlipidemia    Hypertension    Hyperthyroidism    Left upper lobe pulmonary nodule    a. 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 05/2017 s/p  VATS.   Multinodular goiter    a. 02/2017 PET scan- Hypermetabolic nodule;  b. 08/452 s/p thyroidectomy   Myocardial infarction ([place]) 06/2019   NONSTEMI   Obesity    OSA on CPAP    cpap   Osteoarthritis    Personal history of radiation therapy 1999   Right bundle branch block    Type II or unspecified type diabetes mellitus without mention of complication, uncontrolled     Past Surgical History:  Procedure Laterality Date   [place]; 1995; 2000   left; right; left   BREAST EXCISIONAL BIOPSY Right pt unsure   CARDIAC CATHETERIZATION     CORONARY STENT INTERVENTION N/A 07/03/2019   Procedure: CORONARY STENT INTERVENTION;  Surgeon: Troy Sine, MD;  Location: [facility];  Service: Cardiovascular;  Laterality: N/A;  RCA   ESOPHAGOGASTRODUODENOSCOPY (EGD) WITH PROPOFOL N/A 07/16/2020   Procedure: ESOPHAGOGASTRODUODENOSCOPY (EGD) WITH PROPOFOL;  Surgeon: Carol Ada, MD;  Location: [facility];  Service: Endoscopy;  Laterality: N/A;   FINE NEEDLE ASPIRATION N/A 07/16/2020   Procedure: FINE NEEDLE ASPIRATION (FNA) LINEAR;  Surgeon: Carol Ada, MD;  Location: [facility];  Service: Endoscopy;  Laterality: N/A;   LEFT HEART CATH AND CORONARY ANGIOGRAPHY N/A 07/03/2019   Procedure: LEFT HEART CATH AND CORONARY ANGIOGRAPHY;  Surgeon: Troy Sine, MD;  Location: [facility];  Service: Cardiovascular;  Laterality: N/A;   MASTECTOMY Left 2000   left   THYROIDECTOMY  05/10/2017   VIDEO BRONCHOSCOPY WITH ENDOBRONCHIAL NAVIGATION (N/A)   THYROIDECTOMY N/A 05/10/2017   Procedure: TOTAL THYROIDECTOMY;  Surgeon: Armandina Gemma, MD;  Location: [facility];  Service: General;  Laterality: N/A;   UPPER ESOPHAGEAL ENDOSCOPIC ULTRASOUND (EUS) N/A 07/16/2020   Procedure: UPPER ESOPHAGEAL ENDOSCOPIC ULTRASOUND (EUS);  Surgeon: Carol Ada, MD;  Location: [facility];  Service: Endoscopy;  Laterality: N/A;   VIDEO ASSISTED THORACOSCOPY (VATS)/ LOBECTOMY Left 06/04/2017    Procedure: VIDEO ASSISTED THORACOSCOPY (VATS)/LEFT UPPER LOBECTOMY;  Surgeon: [name], MD;  Location: [facility];  Service: Thoracic;  Laterality: Left;   VIDEO BRONCHOSCOPY WITH ENDOBRONCHIAL NAVIGATION N/A 05/10/2017   Procedure: VIDEO BRONCHOSCOPY WITH ENDOBRONCHIAL NAVIGATION;  Surgeon: [name], MD;  Location: [facility];  Service: Thoracic;  Laterality: N/A;   VIDEO BRONCHOSCOPY WITH ENDOBRONCHIAL ULTRASOUND N/A 06/04/2017   Procedure: VIDEO BRONCHOSCOPY WITH ENDOBRONCHIAL ULTRASOUND;  Surgeon: [name], MD;  Location: [facility];  Service: Thoracic;  Laterality: N/A;    FAMHx:  Family History  Problem Relation Age of Onset   Diabetes Brother        x3   Hypertension Brother        x3   Diabetes Brother    Stroke Brother    Heart attack Mother 51       Mother Died of MI age 68   Diabetes Sister    Stroke Sister  SOCHx:   reports that she has never smoked. She has never used smokeless tobacco. She reports current alcohol use. She reports that she does not use drugs.  ALLERGIES:  Allergies  Allergen Reactions   Tramadol Nausea And Vomiting and Other (See Comments)    "got sick to my stomach and was throwing up blood; it put me in the hospital")   Lisinopril Rash and Other (See Comments)    Renal failure    Rosuvastatin Other (See Comments)    Other reaction(s): rhabdo. Other reaction(s): rhabdo. Other reaction(s): rhabdo.   Tapazole [Methimazole] Itching and Rash    ROS: Pertinent items noted in HPI and remainder of comprehensive ROS otherwise negative.  HOME MEDS: Current Outpatient Medications on File Prior to Visit  Medication Sig Dispense Refill   Alirocumab (PRALUENT) 150 MG/ML SOAJ Inject 150 mg into the skin every 14 (fourteen) days. 1 mL 0   amLODipine (NORVASC) 10 MG tablet Take 10 mg by mouth daily.     aspirin EC 81 MG tablet Take 81 mg by mouth daily. Swallow whole.     carvedilol (COREG) 12.5 MG tablet Take 12.5 mg by mouth 2  (two) times daily.     Cholecalciferol (VITAMIN D3) 50 MCG (2000 UT) TABS Take 2,000 Units by mouth daily.     Empagliflozin-Linaglip-Metform (TRIJARDY XR) 12.5-2.07-998 MG TB24 Take 1 tablet by mouth in the morning and at bedtime.     ezetimibe (ZETIA) 10 MG tablet Take 10 mg by mouth every evening.     ferrous sulfate 325 (65 FE) MG tablet Take 325 mg by mouth 2 (two) times daily.     hydrocortisone cream 1 % Apply 1 application topically 2 (two) times daily as needed for itching.      levothyroxine (SYNTHROID, LEVOTHROID) 88 MCG tablet Take 88 mcg by mouth daily before breakfast.   5   nitroGLYCERIN (NITROSTAT) 0.4 MG SL tablet Place 1 tablet (0.4 mg total) under the tongue every 5 (five) minutes as needed for chest pain. 25 tablet 3   Omega-3 Fatty Acids (FISH OIL) 1200 MG CAPS Take 1,200 mg by mouth 2 (two) times daily.      omeprazole (PRILOSEC) 20 MG capsule Take 20 mg by mouth 2 (two) times daily.      vitamin B-12 (CYANOCOBALAMIN) 1000 MCG tablet Take 1,000 mcg by mouth daily.     metoprolol tartrate (LOPRESSOR) 50 MG tablet Take 1 tablet (50 mg total) by mouth every morning AND 0.5 tablets (25 mg total) every evening. (Patient not taking: Reported on 04/11/2021) 135 tablet 4   No current facility-administered medications on file prior to visit.    LABS/IMAGING: No results found for this or any previous visit (from the past 48 hour(s)). No results found.  LIPID PANEL:    Component Value Date/Time   CHOL 121 07/03/2019 0051   TRIG 113 07/03/2019 0051   HDL 37 (L) 07/03/2019 0051   CHOLHDL 3.3 07/03/2019 0051   VLDL 23 07/03/2019 0051   LDLCALC 61 07/03/2019 0051    WEIGHTS: Wt Readings from Last 3 Encounters:  04/11/21 155 lb 3.2 oz (70.4 kg)  02/02/21 153 lb 6.4 oz (69.6 kg)  12/22/20 148 lb (67.1 kg)    VITALS: BP 140/68    Pulse 79    Ht 5\' 1"  (1.549 m)    Wt 155 lb 3.2 oz (70.4 kg)    SpO2 99%    BMI 29.32 kg/m    EXAM: Deferred  EKG: Deferred  ASSESSMENT: Probable familial hyperlipidemia, LDL greater than 190 off therapy Coronary artery disease with prior PCI Bilateral carotid artery disease Aortic atherosclerosis Type 2 diabetes Hypertension OSA on CPAP Chest pain CKD  PLAN: 1.   Karen West has had a slow progressive increase in her LDL cholesterol over the past year.  Recently she has had issues with diarrhea and worsening renal dysfunction.  She had inquired about other treatments for her hypertension which I would defer to either her nephrologist, primary care provider or primary cardiologist-Dr. Percival Spanish.  She might benefit from the addition of hydralazine.  She is not interested in increases in her carvedilol.  She has had some diarrhea which seem to have improved.  I do not think it is related to her ezetimibe.  She might consider adding Metamucil which could help lower her cholesterol as well.  I encouraged her to discuss this with Dr. Almyra Free when she follows up with him this Wednesday.  Plan follow-up with me annually or sooner as necessary.  Pixie Casino, MD, [facility] [place] Director of the Advanced Lipid Disorders &  Cardiovascular Risk Reduction Clinic Diplomate of the American Board of Clinical Lipidology Attending Cardiologist  Direct Dial: [phone]   Fax: [phone]  Website:  [email or web address] Siegfried Vieth 04/11/2021, 4:00 PM

## 2021-04-13 DIAGNOSIS — R197 Diarrhea, unspecified: Secondary | ICD-10-CM | POA: Diagnosis not present

## 2021-04-23 ENCOUNTER — Encounter (HOSPITAL_COMMUNITY): Payer: Self-pay | Admitting: Emergency Medicine

## 2021-04-23 ENCOUNTER — Emergency Department (HOSPITAL_COMMUNITY): Payer: Medicare Other

## 2021-04-23 ENCOUNTER — Inpatient Hospital Stay (HOSPITAL_COMMUNITY)
Admission: EM | Admit: 2021-04-23 | Discharge: 2021-05-18 | DRG: 246 | Disposition: A | Discharge status: Home Health | Payer: Medicare Other | Attending: Internal Medicine | Admitting: Internal Medicine

## 2021-04-23 ENCOUNTER — Inpatient Hospital Stay (HOSPITAL_COMMUNITY)
Admission: EM | Disposition: A | Discharge status: Home Health | Payer: Self-pay | Source: Home / Self Care | Attending: Cardiovascular Disease

## 2021-04-23 ENCOUNTER — Other Ambulatory Visit: Payer: Self-pay

## 2021-04-23 DIAGNOSIS — I255 Ischemic cardiomyopathy: Secondary | ICD-10-CM | POA: Diagnosis present

## 2021-04-23 DIAGNOSIS — E669 Obesity, unspecified: Secondary | ICD-10-CM | POA: Diagnosis present

## 2021-04-23 DIAGNOSIS — R7303 Prediabetes: Secondary | ICD-10-CM | POA: Diagnosis not present

## 2021-04-23 DIAGNOSIS — I2119 ST elevation (STEMI) myocardial infarction involving other coronary artery of inferior wall: Secondary | ICD-10-CM | POA: Diagnosis not present

## 2021-04-23 DIAGNOSIS — I351 Nonrheumatic aortic (valve) insufficiency: Secondary | ICD-10-CM | POA: Diagnosis not present

## 2021-04-23 DIAGNOSIS — R0600 Dyspnea, unspecified: Secondary | ICD-10-CM

## 2021-04-23 DIAGNOSIS — Z20822 Contact with and (suspected) exposure to covid-19: Secondary | ICD-10-CM | POA: Diagnosis not present

## 2021-04-23 DIAGNOSIS — M1990 Unspecified osteoarthritis, unspecified site: Secondary | ICD-10-CM | POA: Diagnosis present

## 2021-04-23 DIAGNOSIS — I5023 Acute on chronic systolic (congestive) heart failure: Secondary | ICD-10-CM | POA: Diagnosis present

## 2021-04-23 DIAGNOSIS — Z8249 Family history of ischemic heart disease and other diseases of the circulatory system: Secondary | ICD-10-CM

## 2021-04-23 DIAGNOSIS — E890 Postprocedural hypothyroidism: Secondary | ICD-10-CM | POA: Diagnosis not present

## 2021-04-23 DIAGNOSIS — Z79899 Other long term (current) drug therapy: Secondary | ICD-10-CM

## 2021-04-23 DIAGNOSIS — Z885 Allergy status to narcotic agent status: Secondary | ICD-10-CM

## 2021-04-23 DIAGNOSIS — Z833 Family history of diabetes mellitus: Secondary | ICD-10-CM

## 2021-04-23 DIAGNOSIS — I2121 ST elevation (STEMI) myocardial infarction involving left circumflex coronary artery: Secondary | ICD-10-CM | POA: Diagnosis not present

## 2021-04-23 DIAGNOSIS — R000 Tachycardia, unspecified: Secondary | ICD-10-CM | POA: Diagnosis not present

## 2021-04-23 DIAGNOSIS — Z888 Allergy status to other drugs, medicaments and biological substances status: Secondary | ICD-10-CM

## 2021-04-23 DIAGNOSIS — I3139 Other pericardial effusion (noninflammatory): Secondary | ICD-10-CM | POA: Diagnosis not present

## 2021-04-23 DIAGNOSIS — K219 Gastro-esophageal reflux disease without esophagitis: Secondary | ICD-10-CM | POA: Diagnosis present

## 2021-04-23 DIAGNOSIS — Z7984 Long term (current) use of oral hypoglycemic drugs: Secondary | ICD-10-CM

## 2021-04-23 DIAGNOSIS — E1122 Type 2 diabetes mellitus with diabetic chronic kidney disease: Secondary | ICD-10-CM | POA: Diagnosis not present

## 2021-04-23 DIAGNOSIS — I517 Cardiomegaly: Secondary | ICD-10-CM | POA: Diagnosis not present

## 2021-04-23 DIAGNOSIS — I213 ST elevation (STEMI) myocardial infarction of unspecified site: Secondary | ICD-10-CM

## 2021-04-23 DIAGNOSIS — N184 Chronic kidney disease, stage 4 (severe): Secondary | ICD-10-CM | POA: Diagnosis present

## 2021-04-23 DIAGNOSIS — I700 Atherosclerosis of aorta: Secondary | ICD-10-CM | POA: Diagnosis not present

## 2021-04-23 DIAGNOSIS — J90 Pleural effusion, not elsewhere classified: Secondary | ICD-10-CM | POA: Diagnosis not present

## 2021-04-23 DIAGNOSIS — R231 Pallor: Secondary | ICD-10-CM | POA: Diagnosis not present

## 2021-04-23 DIAGNOSIS — I472 Ventricular tachycardia, unspecified: Secondary | ICD-10-CM | POA: Diagnosis not present

## 2021-04-23 DIAGNOSIS — E1165 Type 2 diabetes mellitus with hyperglycemia: Secondary | ICD-10-CM | POA: Diagnosis not present

## 2021-04-23 DIAGNOSIS — D509 Iron deficiency anemia, unspecified: Secondary | ICD-10-CM | POA: Diagnosis present

## 2021-04-23 DIAGNOSIS — I2510 Atherosclerotic heart disease of native coronary artery without angina pectoris: Secondary | ICD-10-CM | POA: Diagnosis not present

## 2021-04-23 DIAGNOSIS — I4510 Unspecified right bundle-branch block: Secondary | ICD-10-CM | POA: Diagnosis present

## 2021-04-23 DIAGNOSIS — M109 Gout, unspecified: Secondary | ICD-10-CM | POA: Diagnosis not present

## 2021-04-23 DIAGNOSIS — E119 Type 2 diabetes mellitus without complications: Secondary | ICD-10-CM | POA: Diagnosis not present

## 2021-04-23 DIAGNOSIS — Z7982 Long term (current) use of aspirin: Secondary | ICD-10-CM

## 2021-04-23 DIAGNOSIS — R0602 Shortness of breath: Secondary | ICD-10-CM

## 2021-04-23 DIAGNOSIS — D631 Anemia in chronic kidney disease: Secondary | ICD-10-CM | POA: Diagnosis present

## 2021-04-23 DIAGNOSIS — N179 Acute kidney failure, unspecified: Secondary | ICD-10-CM | POA: Diagnosis not present

## 2021-04-23 DIAGNOSIS — E7849 Other hyperlipidemia: Secondary | ICD-10-CM | POA: Diagnosis present

## 2021-04-23 DIAGNOSIS — I10 Essential (primary) hypertension: Secondary | ICD-10-CM | POA: Diagnosis not present

## 2021-04-23 DIAGNOSIS — I083 Combined rheumatic disorders of mitral, aortic and tricuspid valves: Secondary | ICD-10-CM | POA: Diagnosis not present

## 2021-04-23 DIAGNOSIS — Z85118 Personal history of other malignant neoplasm of bronchus and lung: Secondary | ICD-10-CM

## 2021-04-23 DIAGNOSIS — I252 Old myocardial infarction: Secondary | ICD-10-CM

## 2021-04-23 DIAGNOSIS — R079 Chest pain, unspecified: Secondary | ICD-10-CM | POA: Diagnosis not present

## 2021-04-23 DIAGNOSIS — I130 Hypertensive heart and chronic kidney disease with heart failure and stage 1 through stage 4 chronic kidney disease, or unspecified chronic kidney disease: Secondary | ICD-10-CM | POA: Diagnosis not present

## 2021-04-23 DIAGNOSIS — R0902 Hypoxemia: Secondary | ICD-10-CM | POA: Diagnosis not present

## 2021-04-23 DIAGNOSIS — I5020 Unspecified systolic (congestive) heart failure: Secondary | ICD-10-CM | POA: Diagnosis not present

## 2021-04-23 DIAGNOSIS — I110 Hypertensive heart disease with heart failure: Secondary | ICD-10-CM | POA: Diagnosis not present

## 2021-04-23 DIAGNOSIS — G4733 Obstructive sleep apnea (adult) (pediatric): Secondary | ICD-10-CM | POA: Diagnosis present

## 2021-04-23 DIAGNOSIS — Z9012 Acquired absence of left breast and nipple: Secondary | ICD-10-CM

## 2021-04-23 DIAGNOSIS — R0789 Other chest pain: Secondary | ICD-10-CM | POA: Diagnosis not present

## 2021-04-23 DIAGNOSIS — I5021 Acute systolic (congestive) heart failure: Secondary | ICD-10-CM | POA: Diagnosis not present

## 2021-04-23 DIAGNOSIS — Z6828 Body mass index (BMI) 28.0-28.9, adult: Secondary | ICD-10-CM

## 2021-04-23 DIAGNOSIS — Z955 Presence of coronary angioplasty implant and graft: Secondary | ICD-10-CM

## 2021-04-23 DIAGNOSIS — Z79890 Hormone replacement therapy (postmenopausal): Secondary | ICD-10-CM

## 2021-04-23 DIAGNOSIS — Z923 Personal history of irradiation: Secondary | ICD-10-CM

## 2021-04-23 DIAGNOSIS — Z853 Personal history of malignant neoplasm of breast: Secondary | ICD-10-CM

## 2021-04-23 DIAGNOSIS — J9811 Atelectasis: Secondary | ICD-10-CM | POA: Diagnosis not present

## 2021-04-23 DIAGNOSIS — I081 Rheumatic disorders of both mitral and tricuspid valves: Secondary | ICD-10-CM | POA: Diagnosis not present

## 2021-04-23 DIAGNOSIS — I340 Nonrheumatic mitral (valve) insufficiency: Secondary | ICD-10-CM | POA: Diagnosis not present

## 2021-04-23 DIAGNOSIS — I088 Other rheumatic multiple valve diseases: Secondary | ICD-10-CM | POA: Diagnosis not present

## 2021-04-23 HISTORY — PX: LEFT HEART CATH AND CORONARY ANGIOGRAPHY: CATH118249

## 2021-04-23 HISTORY — PX: CORONARY/GRAFT ACUTE MI REVASCULARIZATION: CATH118305

## 2021-04-23 LAB — TROPONIN I (HIGH SENSITIVITY)
Troponin I (High Sensitivity): 22485 ng/L (ref <18)
Troponin I (High Sensitivity): >24000 ng/L (ref <18)

## 2021-04-23 LAB — CBC WITH DIFFERENTIAL/PLATELET
Abs Immature Granulocytes: 0.04 10*3/uL (ref 0.00–0.07)
Basophils Absolute: 0 10*3/uL (ref 0.0–0.1)
Basophils Relative: 0 %
Eosinophils Absolute: 0 10*3/uL (ref 0.0–0.5)
Eosinophils Relative: 0 %
HCT: 34.8 % — ABNORMAL LOW (ref 36.0–46.0)
Hemoglobin: 11.3 g/dL — ABNORMAL LOW (ref 12.0–15.0)
Immature Granulocytes: 0 %
Lymphocytes Relative: 12 %
Lymphs Abs: 1.1 10*3/uL (ref 0.7–4.0)
MCH: 27.1 pg (ref 26.0–34.0)
MCHC: 32.5 g/dL (ref 30.0–36.0)
MCV: 83.5 fL (ref 80.0–100.0)
Monocytes Absolute: 0.3 10*3/uL (ref 0.1–1.0)
Monocytes Relative: 3 %
Neutro Abs: 7.8 10*3/uL — ABNORMAL HIGH (ref 1.7–7.7)
Neutrophils Relative %: 85 %
Platelets: 403 10*3/uL — ABNORMAL HIGH (ref 150–400)
RBC: 4.17 MIL/uL (ref 3.87–5.11)
RDW: 14.9 % (ref 11.5–15.5)
WBC: 9.2 10*3/uL (ref 4.0–10.5)
nRBC: 0 % (ref 0.0–0.2)

## 2021-04-23 LAB — PROTIME-INR
INR: 1 (ref 0.8–1.2)
Prothrombin Time: 12.7 seconds (ref 11.4–15.2)

## 2021-04-23 LAB — RESP PANEL BY RT-PCR (FLU A&B, COVID) ARPGX2
Influenza A by PCR: NEGATIVE
Influenza B by PCR: NEGATIVE
SARS Coronavirus 2 by RT PCR: NEGATIVE

## 2021-04-23 LAB — MRSA NEXT GEN BY PCR, NASAL: MRSA by PCR Next Gen: NOT DETECTED

## 2021-04-23 LAB — COMPREHENSIVE METABOLIC PANEL
ALT: 16 U/L (ref 0–44)
AST: 63 U/L — ABNORMAL HIGH (ref 15–41)
Albumin: 3.8 g/dL (ref 3.5–5.0)
Alkaline Phosphatase: 58 U/L (ref 38–126)
Anion gap: 9 (ref 5–15)
BUN: 41 mg/dL — ABNORMAL HIGH (ref 8–23)
CO2: 20 mmol/L — ABNORMAL LOW (ref 22–32)
Calcium: 9.4 mg/dL (ref 8.9–10.3)
Chloride: 105 mmol/L (ref 98–111)
Creatinine, Ser: 1.49 mg/dL — ABNORMAL HIGH (ref 0.44–1.00)
GFR, Estimated: 36 mL/min — ABNORMAL LOW (ref >60)
Glucose, Bld: 180 mg/dL — ABNORMAL HIGH (ref 70–99)
Potassium: 4.1 mmol/L (ref 3.5–5.1)
Sodium: 134 mmol/L — ABNORMAL LOW (ref 135–145)
Total Bilirubin: 0.5 mg/dL (ref 0.3–1.2)
Total Protein: 8.2 g/dL — ABNORMAL HIGH (ref 6.5–8.1)

## 2021-04-23 LAB — POCT ACTIVATED CLOTTING TIME
Activated Clotting Time: 203 seconds
Activated Clotting Time: 293 seconds

## 2021-04-23 LAB — LIPID PANEL
Cholesterol: 189 mg/dL (ref 0–200)
HDL: 57 mg/dL (ref >40)
LDL Cholesterol: 114 mg/dL — ABNORMAL HIGH (ref 0–99)
Total CHOL/HDL Ratio: 3.3 RATIO
Triglycerides: 90 mg/dL (ref <150)
VLDL: 18 mg/dL (ref 0–40)

## 2021-04-23 LAB — HEMOGLOBIN A1C
Hgb A1c MFr Bld: 6.1 % — ABNORMAL HIGH (ref 4.8–5.6)
Mean Plasma Glucose: 128.37 mg/dL

## 2021-04-23 LAB — APTT: aPTT: 27 seconds (ref 24–36)

## 2021-04-23 SURGERY — CORONARY/GRAFT ACUTE MI REVASCULARIZATION
Anesthesia: LOCAL

## 2021-04-23 MED ORDER — HEPARIN SODIUM (PORCINE) 5000 UNIT/ML IJ SOLN
4000.0000 [IU] | Freq: Once | INTRAMUSCULAR | Status: DC
  Filled 2021-04-23: qty 1

## 2021-04-23 MED ORDER — CHLORHEXIDINE GLUCONATE CLOTH 2 % EX PADS
6.0000 | MEDICATED_PAD | Freq: Every day | CUTANEOUS | Status: DC
  Administered 2021-04-24 – 2021-05-02 (×8): 6 via TOPICAL

## 2021-04-23 MED ORDER — ASPIRIN 81 MG PO CHEW
324.0000 mg | CHEWABLE_TABLET | Freq: Once | ORAL | Status: DC
  Filled 2021-04-23: qty 4

## 2021-04-23 MED ORDER — SODIUM CHLORIDE 0.9 % IV SOLN
INTRAVENOUS | Status: AC | PRN
  Administered 2021-04-23: 1.75 mg/kg/h via INTRAVENOUS

## 2021-04-23 MED ORDER — HEPARIN (PORCINE) IN NACL 1000-0.9 UT/500ML-% IV SOLN
INTRAVENOUS | Status: AC
  Filled 2021-04-23: qty 1000

## 2021-04-23 MED ORDER — SODIUM CHLORIDE 0.9% FLUSH: 3.0000 mL | INTRAVENOUS | Status: DC | PRN

## 2021-04-23 MED ORDER — ACETAMINOPHEN 325 MG PO TABS
650.0000 mg | ORAL_TABLET | ORAL | Status: DC | PRN
  Administered 2021-04-28 – 2021-05-17 (×14): 650 mg via ORAL
  Filled 2021-04-23 (×14): qty 2

## 2021-04-23 MED ORDER — PANTOPRAZOLE SODIUM 40 MG PO TBEC
40.0000 mg | DELAYED_RELEASE_TABLET | Freq: Every day | ORAL | Status: DC
  Administered 2021-04-24 – 2021-05-18 (×25): 40 mg via ORAL
  Filled 2021-04-23 (×25): qty 1

## 2021-04-23 MED ORDER — FERROUS SULFATE 325 (65 FE) MG PO TABS
325.0000 mg | ORAL_TABLET | Freq: Two times a day (BID) | ORAL | Status: DC
  Administered 2021-04-24 – 2021-05-18 (×46): 325 mg via ORAL
  Filled 2021-04-23 (×49): qty 1

## 2021-04-23 MED ORDER — LEVOTHYROXINE SODIUM 88 MCG PO TABS
88.0000 ug | ORAL_TABLET | Freq: Every day | ORAL | Status: DC
  Administered 2021-04-24 – 2021-05-18 (×25): 88 ug via ORAL
  Filled 2021-04-23 (×25): qty 1

## 2021-04-23 MED ORDER — BIVALIRUDIN BOLUS VIA INFUSION - CUPID
INTRAVENOUS | Status: DC | PRN
  Administered 2021-04-23: 51 mg via INTRAVENOUS

## 2021-04-23 MED ORDER — ASPIRIN 81 MG PO CHEW
81.0000 mg | CHEWABLE_TABLET | Freq: Every day | ORAL | Status: DC
  Administered 2021-04-24 – 2021-04-29 (×6): 81 mg via ORAL
  Filled 2021-04-23 (×6): qty 1

## 2021-04-23 MED ORDER — TICAGRELOR 90 MG PO TABS
ORAL_TABLET | ORAL | Status: DC | PRN
  Administered 2021-04-23: 180 mg via ORAL

## 2021-04-23 MED ORDER — MIDAZOLAM HCL 2 MG/2ML IJ SOLN
INTRAMUSCULAR | Status: DC | PRN
  Administered 2021-04-23: 1 mg via INTRAVENOUS

## 2021-04-23 MED ORDER — SODIUM CHLORIDE 0.9% FLUSH
3.0000 mL | Freq: Two times a day (BID) | INTRAVENOUS | Status: DC
  Administered 2021-04-23 – 2021-05-12 (×38): 3 mL via INTRAVENOUS

## 2021-04-23 MED ORDER — SODIUM CHLORIDE 0.9 % IV SOLN: INTRAVENOUS | Status: AC

## 2021-04-23 MED ORDER — BIVALIRUDIN TRIFLUOROACETATE 250 MG IV SOLR
INTRAVENOUS | Status: AC
  Filled 2021-04-23: qty 250

## 2021-04-23 MED ORDER — LIDOCAINE HCL (PF) 1 % IJ SOLN
INTRAMUSCULAR | Status: AC
  Filled 2021-04-23: qty 30

## 2021-04-23 MED ORDER — DIAZEPAM 5 MG PO TABS
5.0000 mg | ORAL_TABLET | Freq: Four times a day (QID) | ORAL | Status: DC | PRN
  Administered 2021-04-27 – 2021-05-04 (×5): 5 mg via ORAL
  Filled 2021-04-23 (×6): qty 1

## 2021-04-23 MED ORDER — TIROFIBAN HCL IN NACL 5-0.9 MG/100ML-% IV SOLN
INTRAVENOUS | Status: AC | PRN
  Administered 2021-04-23: .075 ug/kg/min via INTRAVENOUS

## 2021-04-23 MED ORDER — VERAPAMIL HCL 2.5 MG/ML IV SOLN
INTRAVENOUS | Status: AC
  Filled 2021-04-23: qty 2

## 2021-04-23 MED ORDER — FENTANYL CITRATE (PF) 100 MCG/2ML IJ SOLN
INTRAMUSCULAR | Status: AC
  Filled 2021-04-23: qty 2

## 2021-04-23 MED ORDER — NITROGLYCERIN 1 MG/10 ML FOR IR/CATH LAB
INTRA_ARTERIAL | Status: DC | PRN
  Administered 2021-04-23 (×4): 200 ug via INTRACORONARY

## 2021-04-23 MED ORDER — TICAGRELOR 90 MG PO TABS
90.0000 mg | ORAL_TABLET | Freq: Two times a day (BID) | ORAL | Status: DC
  Administered 2021-04-23 – 2021-05-18 (×50): 90 mg via ORAL
  Filled 2021-04-23 (×50): qty 1

## 2021-04-23 MED ORDER — IOHEXOL 350 MG/ML SOLN
INTRAVENOUS | Status: DC | PRN
  Administered 2021-04-23: 150 mL via INTRA_ARTERIAL

## 2021-04-23 MED ORDER — SODIUM CHLORIDE 0.9 % IV SOLN: INTRAVENOUS | Status: DC

## 2021-04-23 MED ORDER — CARVEDILOL 6.25 MG PO TABS
6.2500 mg | ORAL_TABLET | Freq: Two times a day (BID) | ORAL | Status: DC
  Administered 2021-04-23 – 2021-04-24 (×2): 6.25 mg via ORAL
  Filled 2021-04-23 (×2): qty 1

## 2021-04-23 MED ORDER — SODIUM CHLORIDE 0.9 % IV SOLN: 250.0000 mL | INTRAVENOUS | Status: DC | PRN

## 2021-04-23 MED ORDER — HYDRALAZINE HCL 20 MG/ML IJ SOLN
10.0000 mg | INTRAMUSCULAR | Status: AC | PRN
  Administered 2021-04-23 (×2): 10 mg via INTRAVENOUS
  Filled 2021-04-23 (×2): qty 1

## 2021-04-23 MED ORDER — LABETALOL HCL 5 MG/ML IV SOLN
10.0000 mg | INTRAVENOUS | Status: AC | PRN
  Administered 2021-04-23 (×2): 10 mg via INTRAVENOUS
  Filled 2021-04-23 (×2): qty 4

## 2021-04-23 MED ORDER — NITROGLYCERIN 0.4 MG SL SUBL
SUBLINGUAL_TABLET | SUBLINGUAL | Status: AC
  Administered 2021-04-23: 0.4 mg via SUBLINGUAL
  Filled 2021-04-23: qty 1

## 2021-04-23 MED ORDER — HEPARIN SODIUM (PORCINE) 1000 UNIT/ML IJ SOLN
INTRAMUSCULAR | Status: AC
  Filled 2021-04-23: qty 10

## 2021-04-23 MED ORDER — TIROFIBAN HCL IN NACL 5-0.9 MG/100ML-% IV SOLN
INTRAVENOUS | Status: AC
  Filled 2021-04-23: qty 100

## 2021-04-23 MED ORDER — NITROGLYCERIN IN D5W 200-5 MCG/ML-% IV SOLN
INTRAVENOUS | Status: AC
  Filled 2021-04-23: qty 250

## 2021-04-23 MED ORDER — FENTANYL CITRATE (PF) 100 MCG/2ML IJ SOLN
INTRAMUSCULAR | Status: DC | PRN
  Administered 2021-04-23: 25 ug via INTRAVENOUS

## 2021-04-23 MED ORDER — TIROFIBAN HCL IV 12.5 MG/250 ML
0.0750 ug/kg/min | INTRAVENOUS | Status: AC
  Administered 2021-04-23: 0.075 ug/kg/min via INTRAVENOUS
  Filled 2021-04-23: qty 250

## 2021-04-23 MED ORDER — TIROFIBAN (AGGRASTAT) BOLUS VIA INFUSION
INTRAVENOUS | Status: DC | PRN
  Administered 2021-04-23: 1700 ug via INTRAVENOUS

## 2021-04-23 MED ORDER — NITROGLYCERIN 1 MG/10 ML FOR IR/CATH LAB
INTRA_ARTERIAL | Status: AC
  Filled 2021-04-23: qty 10

## 2021-04-23 MED ORDER — LABETALOL HCL 5 MG/ML IV SOLN
5.0000 mg | Freq: Four times a day (QID) | INTRAVENOUS | Status: DC | PRN
  Administered 2021-04-24: 5 mg via INTRAVENOUS
  Filled 2021-04-23: qty 4

## 2021-04-23 MED ORDER — NITROGLYCERIN 0.4 MG SL SUBL
0.4000 mg | SUBLINGUAL_TABLET | SUBLINGUAL | Status: DC | PRN
  Administered 2021-04-23 – 2021-04-30 (×7): 0.4 mg via SUBLINGUAL
  Filled 2021-04-23 (×2): qty 1

## 2021-04-23 MED ORDER — MIDAZOLAM HCL 2 MG/2ML IJ SOLN
INTRAMUSCULAR | Status: AC
  Filled 2021-04-23: qty 2

## 2021-04-23 MED ORDER — HEPARIN (PORCINE) IN NACL 1000-0.9 UT/500ML-% IV SOLN
INTRAVENOUS | Status: DC | PRN
  Administered 2021-04-23 (×2): 500 mL

## 2021-04-23 MED ORDER — TICAGRELOR 90 MG PO TABS
ORAL_TABLET | ORAL | Status: AC
  Filled 2021-04-23: qty 2

## 2021-04-23 SURGICAL SUPPLY — 20 items
BALLN SAPPHIRE 2.5X15 (BALLOONS) ×2
BALLN SAPPHIRE ~~LOC~~ 3.0X15 (BALLOONS) ×1 IMPLANT
BALLOON SAPPHIRE 2.5X15 (BALLOONS) IMPLANT
CATH INFINITI 5FR MULTPACK ANG (CATHETERS) ×1 IMPLANT
CATH VISTA GUIDE 6FR XB3.5 (CATHETERS) ×2 IMPLANT
GLIDESHEATH SLEND SS 6F .021 (SHEATH) ×1 IMPLANT
GUIDEWIRE INQWIRE 1.5J.035X260 (WIRE) IMPLANT
INQWIRE 1.5J .035X260CM (WIRE) ×2
KIT ENCORE 26 ADVANTAGE (KITS) ×1 IMPLANT
KIT HEART LEFT (KITS) ×2 IMPLANT
KIT MICROPUNCTURE NIT STIFF (SHEATH) ×1 IMPLANT
PACK CARDIAC CATHETERIZATION (CUSTOM PROCEDURE TRAY) ×2 IMPLANT
SHEATH PINNACLE 6F 10CM (SHEATH) ×1 IMPLANT
SHEATH PROBE COVER 6X72 (BAG) ×1 IMPLANT
STENT ONYX FRONTIER 2.75X26 (Permanent Stent) ×1 IMPLANT
SYR MEDRAD MARK 7 150ML (SYRINGE) ×2 IMPLANT
TRANSDUCER W/STOPCOCK (MISCELLANEOUS) ×2 IMPLANT
TUBING CIL FLEX 10 FLL-RA (TUBING) ×2 IMPLANT
WIRE COUGAR XT STRL 190CM (WIRE) ×1 IMPLANT
WIRE EMERALD 3MM-J .035X150CM (WIRE) ×1 IMPLANT

## 2021-04-23 NOTE — Progress Notes (Signed)
° °  Progress Note  Patient Name: Karen West Date of Encounter: 04/23/2021  Primary Cardiologist: Minus Breeding, MD  Called by nursing regarding patient with recurrent chest pain requiring nitroglycerin, subsequently episode of emesis.  ECG reviewed showing improvement in previous lateral STEMI pattern, she is hemodynamically stable, hypertensive.  Patient afebrile, systolic blood pressure 694W, heart rate in the 90s in sinus rhythm.  Cardiac exam with RRR no gallop or loud murmur.  Plan to start IV nitroglycerin.  She is on aspirin and Brilinta, also continues on Aggrastat for now.  Continue to observe.  Signed, Rozann Lesches, MD  04/23/2021, 3:14 PM

## 2021-04-23 NOTE — ED Triage Notes (Signed)
Pt woke up at 2am with central chest pain, nonradiating. Some SOB. Pt took 324 mg ASA, total of 4 nitro with no relief. EMS reports STEMI on EKG. Hx of EKG. BP 150/70, HR 70-80. Pain 8/10.

## 2021-04-23 NOTE — Plan of Care (Signed)

## 2021-04-23 NOTE — Progress Notes (Signed)
CARDIAC REHAB PHASE I   Delivered MI book, along with heart healthy and diabetic diets , and exercise to pts room. Will place CRP II order to [facility]. Will f/u for education Monday if still inpatient, or via phone at later time.  [phone] Rufina Falco, RN BSN 04/23/2021 12:40 PM

## 2021-04-23 NOTE — Progress Notes (Signed)
Chaplain responded to STEMI, pt is not available. Checked with RN; no family is present.  Please contact if support is needed.  Minus [place], [place] Pager:  [phone]    04/23/21 [phone]  Clinical Encounter Type  Visited With Patient not available  Visit Type Initial;Critical Care  Referral From Nurse  Consult/Referral To Chaplain  Stress Factors  Patient Stress Factors Health changes  Advance Directives (For Healthcare)  Does Patient Have a Medical Advance Directive? No  Would patient like information on creating a medical advance directive? No - Patient declined  [place]  Does Patient Have a Mental Health Advance Directive? No  Would patient like information on creating a mental health advance directive? No - Patient declined

## 2021-04-23 NOTE — Progress Notes (Signed)
ANTICOAGULATION CONSULT NOTE - Initial Consult  Pharmacy Consult for bivalirudin and tirofiban Indication: chest pain/ACS  Allergies  Allergen Reactions   Tramadol Nausea And Vomiting and Other (See Comments)    "got sick to my stomach and was throwing up blood; it put me in the hospital")   Lisinopril Rash and Other (See Comments)    Renal failure    Rosuvastatin Other (See Comments)    Other reaction(s): rhabdo. Other reaction(s): rhabdo. Other reaction(s): rhabdo.   Tapazole [Methimazole] Itching and Rash    Patient Measurements: Height: 5\' 1"  (154.9 cm) Weight: 69.9 kg (154 lb 1.6 oz) IBW/kg (Calculated) : 47.8  Vital Signs: Temp: 97.8 F (36.6 C) (02/04 1135) Temp Source: Oral (02/04 1135) BP: 165/76 (02/04 1232) Pulse Rate: 80 (02/04 1300)  Labs: Recent Labs    04/23/21 1000  HGB 11.3*  HCT 34.8*  PLT 403*  APTT 27  LABPROT 12.7  INR 1.0  CREATININE 1.49*  TROPONINIHS 22,485*    Estimated Creatinine Clearance: 27.8 mL/min (A) (by C-G formula based on SCr of 1.49 mg/dL (H)).   Medical History: Past Medical History:  Diagnosis Date   Atherosclerosis of aorta ([place])    a. 01/2017/03/2017 - noted on high res chest CTs.   Breast cancer ([place])    a. Bilateral --> s/p left mastectomy   Carotid artery disease ([place])    a. 09/4257 w/ 56-38% LICA stenosis and <75% RICA stenosis; b. 10/2015 Carotid U/S: < 50% BICA stenosis   Chest pain    a. 09/2011 MV: EF 68%, no ischemia/infarct.   Chronic anemia    Chronic headaches    denies   Coronary artery calcification seen on CT scan    a. 01/2017 High res CT: atherosclerotic calcification of the arterial vascularture, including severe involvement of the coronary arteries; b. 03/2017 CT Chest: coronary and Ao atheroscelrosis.   GERD (gastroesophageal reflux disease)    History of echocardiogram    a. 09/2011 Echo: EF 55-60%, no rwma, triv AI, PASP 70mmHg.   Hyperlipidemia    Hypertension    Hyperthyroidism    Left  upper lobe pulmonary nodule    a. 02/2017 PET: slowing enlarging 1.7cm LUL nodule w/ low-grade metabolic activity; b. 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 05/2017 s/p VATS.   Multinodular goiter    a. 02/2017 PET scan- Hypermetabolic nodule;  b. 11/5186 s/p thyroidectomy   Myocardial infarction ([place]) 06/2019   NONSTEMI   Obesity    OSA on CPAP    cpap   Osteoarthritis    Personal history of radiation therapy 1999   Right bundle branch block    Type II or unspecified type diabetes mellitus without mention of complication, uncontrolled     Medications:  Infusions:   sodium chloride 10 mL/hr at 04/23/21 1021   sodium chloride 60 mL/hr at 04/23/21 1318   sodium chloride     tirofiban      Assessment: 81 yo female with chest pain presented as code STEMI. DES placed to circumflex. Patient loaded with Brilinta in cath. Bivalirudin was started in cath and planned to continue until bag is completed. Bag completed and stopped shortly after cath.   Tirofiban for 18 hours after cath to continue until 04/24/21 0600. CrCl= 27 so qualifies for low dose tirofiban. No bolus with initiation as started in cath.   No signs of bleeding per RN.  Goal of Therapy:  Monitor platelets by anticoagulation protocol: Yes   Plan:  Tirofiban 0.075 mcg/kg/min x 18  hours post cath to end 04/24/21 0600 Monitor for s/x of bleeding Daily CBC  Kire Ferg A Itay Mella 04/23/2021,1:20 PM

## 2021-04-23 NOTE — H&P (Addendum)
Cardiology Admission History and Physical:   Patient ID: Karen West MRN: [id number]; DOB: Jan 25, 1941   Admission date: 04/23/2021  PCP:  Deland Pretty, MD   [facility] Providers Cardiologist:  Minus Breeding, MD   Chief Complaint:  inferolateral STEMI  Patient Profile:   Karen West is a 81 y.o. female with CAD s/p DES to RCA in 2021, severe MAC, HTN, HLD, multinodular thyroid s/p thyroidectomy in 2019, anemia, GERD, known RBBB, DM2, OSA, carotid artery disease with 50-69% stenosis bilaterally, breast cancer, and lung cancer s/p LVATS 2019, negative myoview stress test in 2013 who is being seen 04/23/2021 for the evaluation of STEMI.  History of Present Illness:   Karen West has a history of CAD with distal RCA disease. Cath in 7829 complicated by guide catheter induced spiral dissection resulting in placement of four stents total. There was residual disease in the LAD of 60-70% felt may require atherectomy in the future. She had 2:1 heart block associated with significant radial artery spasm.  Echo at that time with preserved EF, no WMA, grade 1 DD, moderate MR, mild to moderate TR, moderate AI. LDL was 61 on crestor and zetia. She was intolerant to higher dose of crestor secondary to elevated CK consistent with rhabdomyolysis.   She was taken off brilinta 06/23/20.   She follows with Dr. Percival Spanish and had a nonischemic POET 03/04/20. She was last seen in lipid clinic on 04/11/21 and denied chest pain. 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   At 0200 today, she woke up with chest pain concerning for angina. She tool 324 mg ASA and SL nitro, but pain persisted prompting her to call EMS. 12-lead in the field revealed ST elevation in inferior and lateral leads and CODE STEMI was activated. HST upon her arrival at 10AM was > 22000.  Past Medical History:  Diagnosis Date   Atherosclerosis of aorta ([place])    a. 01/2017/03/2017 - noted on high  res chest CTs.   Breast cancer ([place])    a. Bilateral --> s/p left mastectomy   Carotid artery disease ([place])    a. 07/6211 w/ 08-65% LICA stenosis and <78% RICA stenosis; b. 10/2015 Carotid U/S: < 50% BICA stenosis   Chest pain    a. 09/2011 MV: EF 68%, no ischemia/infarct.   Chronic anemia    Chronic headaches    denies   Coronary artery calcification seen on CT scan    a. 01/2017 High res CT: atherosclerotic calcification of the arterial vascularture, including severe involvement of the coronary arteries; b. 03/2017 CT Chest: coronary and Ao atheroscelrosis.   GERD (gastroesophageal reflux disease)    History of echocardiogram    a. 09/2011 Echo: EF 55-60%, no rwma, triv AI, PASP 33mHg.   Hyperlipidemia    Hypertension    Hyperthyroidism    Left upper lobe pulmonary nodule    a. 02/2017 PET: slowing enlarging 1.7cm LUL nodule w/ low-grade metabolic activity; b. 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 05/2017 s/p VATS.   Multinodular goiter    a. 02/2017 PET scan- Hypermetabolic nodule;  b. 29/5284s/p thyroidectomy   [place]   NONSTEMI   Obesity    OSA on CPAP    cpap   Osteoarthritis    Personal history of radiation therapy 1999   Right bundle branch block    Type II or unspecified type diabetes mellitus without mention of complication, uncontrolled     Past Surgical History:  Procedure Laterality Date   BREAST BIOPSY  1993; 1995; 2000   left; right; left   BREAST EXCISIONAL BIOPSY Right pt unsure   CARDIAC CATHETERIZATION     CORONARY STENT INTERVENTION N/A 07/03/2019   Procedure: CORONARY STENT INTERVENTION;  Surgeon: Troy Sine, MD;  Location: [facility];  Service: Cardiovascular;  Laterality: N/A;  RCA   ESOPHAGOGASTRODUODENOSCOPY (EGD) WITH PROPOFOL N/A 07/16/2020   Procedure: ESOPHAGOGASTRODUODENOSCOPY (EGD) WITH PROPOFOL;  Surgeon: Carol Ada, MD;  Location: [facility];  Service: Endoscopy;  Laterality: N/A;   FINE NEEDLE  ASPIRATION N/A 07/16/2020   Procedure: FINE NEEDLE ASPIRATION (FNA) LINEAR;  Surgeon: Carol Ada, MD;  Location: [facility];  Service: Endoscopy;  Laterality: N/A;   LEFT HEART CATH AND CORONARY ANGIOGRAPHY N/A 07/03/2019   Procedure: LEFT HEART CATH AND CORONARY ANGIOGRAPHY;  Surgeon: Troy Sine, MD;  Location: [facility];  Service: Cardiovascular;  Laterality: N/A;   MASTECTOMY Left 2000   left   THYROIDECTOMY  05/10/2017   VIDEO BRONCHOSCOPY WITH ENDOBRONCHIAL NAVIGATION (N/A)   THYROIDECTOMY N/A 05/10/2017   Procedure: TOTAL THYROIDECTOMY;  Surgeon: Armandina Gemma, MD;  Location: [facility];  Service: General;  Laterality: N/A;   UPPER ESOPHAGEAL ENDOSCOPIC ULTRASOUND (EUS) N/A 07/16/2020   Procedure: UPPER ESOPHAGEAL ENDOSCOPIC ULTRASOUND (EUS);  Surgeon: Carol Ada, MD;  Location: [facility];  Service: Endoscopy;  Laterality: N/A;   VIDEO ASSISTED THORACOSCOPY (VATS)/ LOBECTOMY Left 06/04/2017   Procedure: VIDEO ASSISTED THORACOSCOPY (VATS)/LEFT UPPER LOBECTOMY;  Surgeon: [name], MD;  Location: [facility];  Service: Thoracic;  Laterality: Left;   VIDEO BRONCHOSCOPY WITH ENDOBRONCHIAL NAVIGATION N/A 05/10/2017   Procedure: VIDEO BRONCHOSCOPY WITH ENDOBRONCHIAL NAVIGATION;  Surgeon: [name], MD;  Location: [facility];  Service: Thoracic;  Laterality: N/A;   VIDEO BRONCHOSCOPY WITH ENDOBRONCHIAL ULTRASOUND N/A 06/04/2017   Procedure: VIDEO BRONCHOSCOPY WITH ENDOBRONCHIAL ULTRASOUND;  Surgeon: [name], MD;  Location: [facility];  Service: Thoracic;  Laterality: N/A;     Medications Prior to Admission: Prior to Admission medications   Medication Sig Start Date End Date Taking? Authorizing Provider  Alirocumab (PRALUENT) 150 MG/ML SOAJ Inject 150 mg into the skin every 14 (fourteen) days. 02/23/20   Minus Breeding, MD  amLODipine (NORVASC) 10 MG tablet Take 10 mg by mouth daily.    [provider]  aspirin EC 81 MG tablet Take 81 mg by mouth  daily. Swallow whole.    [provider]  carvedilol (COREG) 12.5 MG tablet Take 12.5 mg by mouth 2 (two) times daily. 03/09/21   [provider]  Cholecalciferol (VITAMIN D3) 50 MCG (2000 UT) TABS Take 2,000 Units by mouth daily.    [provider]  Empagliflozin-Linaglip-Metform (TRIJARDY XR) 12.5-2.07-998 MG TB24 Take 1 tablet by mouth in the morning and at bedtime.    [provider]  ezetimibe (ZETIA) 10 MG tablet Take 10 mg by mouth every evening.    [provider]  ferrous sulfate 325 (65 FE) MG tablet Take 325 mg by mouth 2 (two) times daily. 09/27/20   [provider]  hydrocortisone cream 1 % Apply 1 application topically 2 (two) times daily as needed for itching.     [provider]  levothyroxine (SYNTHROID, LEVOTHROID) 88 MCG tablet Take 88 mcg by mouth daily before breakfast.  07/02/17   [provider]  metoprolol tartrate (LOPRESSOR) 50 MG tablet Take 1 tablet (50 mg total) by mouth every morning AND 0.5 tablets (25 mg total)  every evening. Patient not taking: Reported on 04/11/2021 02/25/21   Minus Breeding, MD  nitroGLYCERIN (NITROSTAT) 0.4 MG SL tablet Place 1 tablet (0.4 mg total) under the tongue every 5 (five) minutes as needed for chest pain. 10/28/19 04/11/21  Deberah Pelton, NP  Omega-3 Fatty Acids (FISH OIL) 1200 MG CAPS Take 1,200 mg by mouth 2 (two) times daily.     [provider]  omeprazole (PRILOSEC) 20 MG capsule Take 20 mg by mouth 2 (two) times daily.     [provider]  vitamin B-12 (CYANOCOBALAMIN) 1000 MCG tablet Take 1,000 mcg by mouth daily.    [provider]     Allergies:    Allergies  Allergen Reactions   Tramadol Nausea And Vomiting and Other (See Comments)    "got sick to my stomach and was throwing up blood; it put me in the hospital")   Lisinopril Rash and Other (See Comments)    Renal failure    Rosuvastatin Other (See Comments)    Other  reaction(s): rhabdo. Other reaction(s): rhabdo. Other reaction(s): rhabdo.   Tapazole [Methimazole] Itching and Rash    Social History:   Social History   Socioeconomic History   Marital status: Widowed    Spouse name: Not on file   Number of children: Not on file   Years of education: 16   Highest education level: Bachelor's degree (e.g., BA, AB, BS)  Occupational History   Occupation: retired    Fish farm manager: RETIRED    Comment: accountant  Tobacco Use   Smoking status: Never   Smokeless tobacco: Never  Vaping Use   Vaping Use: Never used  Substance and Sexual Activity   Alcohol use: Yes    Comment: occ   Drug use: No   Sexual activity: Yes    Partners: Male    Birth control/protection: None  Other Topics Concern   Not on file  Social History Narrative   Lives alone.  Four children.    Social Determinants of Health   Financial Resource Strain: Not on file  Food Insecurity: Not on file  Transportation Needs: Not on file  Physical Activity: Not on file  Stress: Not on file  Social Connections: Not on file  Intimate Partner Violence: Not on file    Family History:   The patient's family history includes Diabetes in her brother, brother, and sister; Heart attack (age of onset: 17) in her mother; Hypertension in her brother; Stroke in her brother and sister.    ROS:  Please see the history of present illness.  All other ROS reviewed and negative.     Physical Exam/Data:   Vitals:   04/23/21 1131 04/23/21 1135 04/23/21 1155 04/23/21 1200  BP:      Pulse: (!) 0 84    Resp: (!) 0 _0 Temp:  97.8 F (36.6 C)    TempSrc:  Oral    SpO2: (!) 0% 93% 92% 95%  Weight:  69.9 kg    Height:  _1  (1.549 m)     No intake or output data in the 24 hours ending 04/23/21 1209 Last 3 Weights 04/23/2021 04/23/2021 04/11/2021  Weight (lbs) 154 lb 1.6 oz 150 lb 155 lb 3.2 oz  Weight (kg) 69.9 kg 68.04 kg 70.398 kg     Body mass index is 29.12 kg/m.   Patient was  examined by Dr. Claiborne Billings. Physical exam to follow.   EKG:  The ECG that was done  was  personally reviewed and demonstrates sinus rhythm with HR 81, RBBB, STE inferior and lateral leads, ST depression in V1 and AVL  Relevant CV Studies:  Cath read pending  Echo pending  Laboratory Data:  High Sensitivity Troponin:   Recent Labs  Lab 04/23/21 1000  TROPONINIHS 22,485*      Chemistry Recent Labs  Lab 04/23/21 1000  NA 134*  K 4.1  CL 105  CO2 20*  GLUCOSE 180*  BUN 41*  CREATININE 1.49*  CALCIUM 9.4  GFRNONAA 36*  ANIONGAP 9    Recent Labs  Lab 04/23/21 1000  PROT 8.2*  ALBUMIN 3.8  AST 63*  ALT 16  ALKPHOS 58  BILITOT 0.5   Lipids  Recent Labs  Lab 04/23/21 1000  CHOL 189  TRIG 90  HDL 57  LDLCALC 114*  CHOLHDL 3.3   Hematology Recent Labs  Lab 04/23/21 1000  WBC 9.2  RBC 4.17  HGB 11.3*  HCT 34.8*  MCV 83.5  MCH 27.1  MCHC 32.5  RDW 14.9  PLT 403*   Thyroid No results for input(s): TSH, FREET4 in the last 168 hours. BNPNo results for input(s): BNP, PROBNP in the last 168 hours.  DDimer No results for input(s): DDIMER in the last 168 hours.   Radiology/Studies:  DG Chest Port 1 View  Result Date: 04/23/2021 CLINICAL DATA:  Chest pain EXAM: PORTABLE CHEST 1 VIEW COMPARISON:  Previous studies including the chest radiograph done on 12/22/2020 FINDINGS: Transverse diameter of heart is increased. Central pulmonary vessels are more prominent. Increased interstitial markings are seen in the parahilar regions and lower lung fields. Dense calcification is seen in the mitral annulus. There is blunting of left lateral CP angle. There is no pneumothorax. Surgical clips are seen in the right axilla and right chest wall. IMPRESSION: Cardiomegaly. Central pulmonary vessels are more prominent suggesting CHF. Increased interstitial markings are seen in the parahilar regions and lower lung fields suggesting interstitial pulmonary edema or interstitial pneumonitis.  Possible small left pleural effusion. Electronically Signed   By: Elmer Picker M.D.   On: 04/23/2021 10:23     Assessment and Plan:   Inferolateral STEMI CODE STEMI called and patient was taken emergently to the cath lab for definitive angiography and revascularization. LHC showed widely patent RCA but occluded LCX which was successfully treated with DES. Pt tolerated the procedure well and will complete aggrenox gtt. Pt will start ASA and brilinta.    CAD RCA widely patent Recommend longterm DAPT   Hyperlipidemia with LDL goal < 55 04/23/2021: Cholesterol 189; HDL 57; LDL Cholesterol 114; Triglycerides 90; VLDL 18 Question if she is taking her medications - only praluent and zetia ar listed, but LDL has been better controlled on prior checks.    Hypertension - continue coreg, amlodipine as pressure allows - need to clarify BB with patient   DM2 A1c pending SSI    Risk Assessment/Risk Scores:   TIMI Risk Score for ST  Elevation MI:   The patient's TIMI risk score is 5, which indicates a 12.4% risk of all cause mortality at 30 days.       Severity of Illness: The appropriate patient status for this patient is INPATIENT. Inpatient status is judged to be reasonable and necessary in order to provide the required intensity of service to ensure the patient's safety. The patient's presenting symptoms, physical exam findings, and initial radiographic and laboratory data in the context of their chronic comorbidities is felt to place them at high risk  for further clinical deterioration. Furthermore, it is not anticipated that the patient will be medically stable for discharge from the hospital within 2 midnights of admission.   * I certify that at the point of admission it is my clinical judgment that the patient will require inpatient hospital care spanning beyond 2 midnights from the point of admission due to high intensity of service, high risk for further deterioration and high  frequency of surveillance required.*   For questions or updates, please contact [facility] Please consult [email or web address] for contact info under     Signed, Ledora Bottcher, [place]  04/23/2021 12:09 PM   Patient seen and examined. Agree with assessment and plan.  Karen West is a 81 year old female patient who is followed by Dr. Percival Spanish for cardiology care.  She has a history of hypertension,  with probable familial hyperlipidemia with statin intolerance currently on Praluent and Zetia, history of multinodular thyroid status post thyroidectomy in 2019, GERD, right bundle branch block, diabetes mellitus, obstructive sleep apnea, carotid disease in addition to breast and lung cancer.  In April 2021 she was found to have severe disease in her mid distal RCA with subtotal stenosis.  The vessel was very tortuous.  She underwent catheterization which was extremely difficult due to poor catheter backup and complex intervention quiring GuideLiner port to reach the lesions and they will opt external dissection leading to a total of 4 stent placement.  The RCA.  She had concomitant CAD with calcified stenoses in her proximal LAD and a relatively normal circumflex.  She was maintained on DAPT but apparently Brilinta was stopped in April 2022.  She had undergone a routine treadmill test in December 2021.  This morning at approximately 2 AM she was awakened with chest pain.  Her chest pain persisted.  She took nitroglycerin sublingually and baby aspirin.  Ultimately due to continued chest pain she called EMS.  ECG showed inferolateral ST elevation and a code STEMI was activated.  Upon arrival to [facility] ER she was still having chest pain.  She was hypertensive with blood pressure 160/100. There was mild was arcus senilis.  Mallampati scale 3.  There was no JVD.  She had decreased breath sounds at bases without rales or wheezes.  Rhythm was regular with 1/6 systolic murmur.  She had central adiposity.  Pulses were  adequate.  There was no significant edema.  Her ECG in the ER now shows progressive inferolateral ST elevation.  Since previously the catheterization had to be aborted from the radial approach the plan will be emergent cardiac catheterization via the femoral artery.  As result I recommended that she not be given heparin in the ER since she will be taken emergently to the catheterization laboratory and most likely may need bivalirudin following femoral access.  Patient is aware of the need for emergent cardiac catheterization and is aware of risk benefits of the procedure   Troy Sine, MD, [facility] 04/23/2021 12:27 PM

## 2021-04-23 NOTE — ED Provider Notes (Signed)
[facility] EMERGENCY DEPARTMENT Provider Note   CSN: [id number] Arrival date & time: 04/23/21  0944     History  Chief Complaint  Patient presents with   Code STEMI    Karen West is a 81 y.o. female.  The history is provided by the patient, medical records and the EMS personnel. No language interpreter was used.  Chest Pain Pain location:  Substernal area Pain quality: aching, crushing, pressure and radiating   Pain radiates to:  R shoulder and upper back Pain severity:  Severe Onset quality:  Sudden Duration:  2 hours Timing:  Constant Progression:  Worsening Chronicity:  Recurrent Relieved by:  Nothing Worsened by:  Nothing Ineffective treatments:  Aspirin, nitroglycerin and oxygen Associated symptoms: back pain, diaphoresis, fatigue and shortness of breath   Associated symptoms: no abdominal pain, no cough, no fever, no headache, no lower extremity edema, no nausea, no palpitations, no vomiting and no weakness   Risk factors: coronary artery disease       Home Medications Prior to Admission medications   Medication Sig Start Date End Date Taking? Authorizing Provider  Alirocumab (PRALUENT) 150 MG/ML SOAJ Inject 150 mg into the skin every 14 (fourteen) days. 02/23/20   Minus Breeding, MD  amLODipine (NORVASC) 10 MG tablet Take 10 mg by mouth daily.    [provider]  aspirin EC 81 MG tablet Take 81 mg by mouth daily. Swallow whole.    [provider]  carvedilol (COREG) 12.5 MG tablet Take 12.5 mg by mouth 2 (two) times daily. 03/09/21   [provider]  Cholecalciferol (VITAMIN D3) 50 MCG (2000 UT) TABS Take 2,000 Units by mouth daily.    [provider]  Empagliflozin-Linaglip-Metform (TRIJARDY XR) 12.5-2.07-998 MG TB24 Take 1 tablet by mouth in the morning and at bedtime.    [provider]  ezetimibe (ZETIA) 10 MG tablet Take 10 mg by mouth every evening.    [provider]  ferrous  sulfate 325 (65 FE) MG tablet Take 325 mg by mouth 2 (two) times daily. 09/27/20   [provider]  hydrocortisone cream 1 % Apply 1 application topically 2 (two) times daily as needed for itching.     [provider]  levothyroxine (SYNTHROID, LEVOTHROID) 88 MCG tablet Take 88 mcg by mouth daily before breakfast.  07/02/17   [provider]  metoprolol tartrate (LOPRESSOR) 50 MG tablet Take 1 tablet (50 mg total) by mouth every morning AND 0.5 tablets (25 mg total) every evening. Patient not taking: Reported on 04/11/2021 02/25/21   Minus Breeding, MD  nitroGLYCERIN (NITROSTAT) 0.4 MG SL tablet Place 1 tablet (0.4 mg total) under the tongue every 5 (five) minutes as needed for chest pain. 10/28/19 04/11/21  Deberah Pelton, NP  Omega-3 Fatty Acids (FISH OIL) 1200 MG CAPS Take 1,200 mg by mouth 2 (two) times daily.     [provider]  omeprazole (PRILOSEC) 20 MG capsule Take 20 mg by mouth 2 (two) times daily.     [provider]  vitamin B-12 (CYANOCOBALAMIN) 1000 MCG tablet Take 1,000 mcg by mouth daily.    [provider]      Allergies    Tramadol, Lisinopril, Rosuvastatin, and Tapazole [methimazole]    Review of Systems   Review of Systems  Constitutional:  Positive for diaphoresis and fatigue. Negative for chills and fever.  HENT:  Negative for congestion.   Eyes:  Negative for visual disturbance.  Respiratory:  Positive  for chest tightness and shortness of breath. Negative for cough.   Cardiovascular:  Positive for chest pain. Negative for palpitations.  Gastrointestinal:  Negative for abdominal pain, constipation, diarrhea, nausea and vomiting.  Genitourinary:  Negative for flank pain.  Musculoskeletal:  Positive for back pain. Negative for neck pain.  Skin:  Negative for rash and wound.  Neurological:  Negative for weakness and headaches.  Psychiatric/Behavioral:  Negative for agitation and confusion.   All other systems reviewed  and are negative.  Physical Exam Updated Vital Signs BP (!) 163/106    Pulse 78    Temp (!) 97.5 F (36.4 C)    Resp (!) 23    Ht 5\' 1"  (1.549 m)    Wt 68 kg    SpO2 97%    BMI 28.34 kg/m  Physical Exam Vitals and nursing note reviewed.  Constitutional:      General: She is in acute distress.     Appearance: She is well-developed. She is ill-appearing. She is not toxic-appearing or diaphoretic.  HENT:     Head: Normocephalic and atraumatic.     Mouth/Throat:     Mouth: Mucous membranes are moist.  Eyes:     Extraocular Movements: Extraocular movements intact.     Conjunctiva/sclera: Conjunctivae normal.     Pupils: Pupils are equal, round, and reactive to light.  Cardiovascular:     Rate and Rhythm: Normal rate and regular rhythm.     Pulses: Normal pulses.     Heart sounds: No murmur heard. Pulmonary:     Effort: Tachypnea present. No respiratory distress.     Breath sounds: Normal breath sounds. No wheezing, rhonchi or rales.  Chest:     Chest wall: No tenderness.  Abdominal:     General: Abdomen is flat.     Palpations: Abdomen is soft.     Tenderness: There is no abdominal tenderness. There is no guarding or rebound.  Musculoskeletal:        General: No swelling or tenderness.     Cervical back: Neck supple. No tenderness.  Skin:    General: Skin is warm and dry.     Capillary Refill: Capillary refill takes less than 2 seconds.     Findings: No erythema.  Neurological:     General: No focal deficit present.     Mental Status: She is alert.    ED Results / Procedures / Treatments   Labs (all labs ordered are listed, but only abnormal results are displayed) Labs Reviewed  RESP PANEL BY RT-PCR (FLU A&B, COVID) ARPGX2  HEMOGLOBIN A1C  CBC WITH DIFFERENTIAL/PLATELET  PROTIME-INR  APTT  COMPREHENSIVE METABOLIC PANEL  LIPID PANEL  TROPONIN I (HIGH SENSITIVITY)    EKG EKG Interpretation  Date/Time:  Saturday April 23 2021 09:48:09 EST Ventricular Rate:   81 PR Interval:  118 QRS Duration: 123 QT Interval:  420 QTC Calculation: 488 R Axis:   126 Text Interpretation: Sinus rhythm Borderline short PR interval Right bundle branch block Inferolateral infarct, acute (RCA) Anterior infarct, acute Probable RV involvement, suggest recording right precordial leads >>> Acute MI <<< STEMI Confirmed by Antony Blackbird [phone]) on 04/23/2021 9:53:22 AM  Radiology No results found.  Procedures Procedures    CRITICAL CARE Performed by: Gwenyth Allegra Aquanetta Schwarz Total critical care time: 35 minutes Critical care time was exclusive of separately billable procedures and treating other patients. Critical care was necessary to treat or prevent imminent or life-threatening deterioration. Critical care was time spent personally by me  on the following activities: development of treatment plan with patient and/or surrogate as well as nursing, discussions with consultants, evaluation of patient's response to treatment, examination of patient, obtaining history from patient or surrogate, ordering and performing treatments and interventions, ordering and review of laboratory studies, ordering and review of radiographic studies, pulse oximetry and re-evaluation of patient's condition.  EMERGENCY DEPARTMENT  US GUIDANCE EXAM Emergency Ultrasound:  US Guidance for Needle Guidance  INDICATIONS: Difficult vascular access Linear probe used in real-time to visualize location of needle entry through skin.   PERFORMED BY: Myself IMAGES ARCHIVED?: No LIMITATIONS:  scar tissue VIEWS USED: Transverse INTERPRETATION: Needle visualized within vein, Right arm, and Needle gauge 18   Medications Ordered in ED Medications  0.9 %  sodium chloride infusion ( Intravenous New Bag/Given 04/23/21 1007)  heparin injection 4,000 Units (4,000 Units Intravenous Not Given 04/23/21 1006)    ED Course/ Medical Decision Making/ A&P                           Medical Decision Making Amount  and/or Complexity of Data Reviewed Labs: ordered. Radiology: ordered.  Risk Prescription drug management. Decision regarding hospitalization.   TERRION POBLANO is a 81 y.o. female with a past medical history significant for hypertension, hyperlipidemia, diabetes, sleep apnea, breast cancer, and previous CAD with MI and stents who presents with acute chest pain.  According to patient and EMS, she woke up with pain this morning that is pressure in her central chest.  Does go to her upper back and her right shoulder.  She reports it feels similar in quality to her previous MIs but is worse in severity.  She describes as 10 out of 10 in severity initially and is now 8 out of 10.  She did not report other symptoms aside from some shortness of breath.  She felt cool and clammy and sweaty with EMS and was placed on oxygen although they did not experience hypoxia in route.  Patient denies any other preceding symptoms and was doing well yesterday.  EMS reports glucose was about 200.  Patient was reportedly activated as a STEMI by EMS in route but then was canceled by cardiology in route.   Upon arrival, initial EKG is concerning for STEMI when compared to prior.  We will reactivate STEMI.  On exam, lungs clear and chest was nontender.  I did not appreciate significant murmur.  Abdomen nontender.  Good pulses in extremities.  Patient appears uncomfortable and is short of breath.  Oxygen saturations were in the 70s initially so her oxygen was increased.  It is back into the 90s.  Nursing had difficult time obtaining access so I placed an ultrasound-guided IV into her right arm as her left arm is restricted.  EKG does show STEMI.  Dr. Claiborne Billings with cardiology came down to the bedside and will take patient to the Cath Lab.  Patient was started on heparin and had other screening work-up initiated including a COVID swab, will get chest x-ray, and basic labs.  Patient will be admitted to cardiology catheter  trip to Cath Lab.  Patient will get heparin and will go upstairs shortly.         Final Clinical Impression(s) / ED Diagnoses Final diagnoses:  ST elevation myocardial infarction (STEMI), unspecified artery (HCC)     Clinical Impression: 1. ST elevation myocardial infarction (STEMI), unspecified artery ([place])     Disposition: Admit  This note was prepared  with assistance of Systems analyst. Occasional wrong-word or sound-a-like substitutions may have occurred due to the inherent limitations of voice recognition software.     Amory Zbikowski, Gwenyth Allegra, MD 04/23/21 1014

## 2021-04-24 ENCOUNTER — Inpatient Hospital Stay (HOSPITAL_COMMUNITY): Payer: Medicare Other

## 2021-04-24 DIAGNOSIS — I2121 ST elevation (STEMI) myocardial infarction involving left circumflex coronary artery: Secondary | ICD-10-CM

## 2021-04-24 LAB — BASIC METABOLIC PANEL
Anion gap: 16 — ABNORMAL HIGH (ref 5–15)
BUN: 37 mg/dL — ABNORMAL HIGH (ref 8–23)
CO2: 16 mmol/L — ABNORMAL LOW (ref 22–32)
Calcium: 8.5 mg/dL — ABNORMAL LOW (ref 8.9–10.3)
Chloride: 108 mmol/L (ref 98–111)
Creatinine, Ser: 1.6 mg/dL — ABNORMAL HIGH (ref 0.44–1.00)
GFR, Estimated: 33 mL/min — ABNORMAL LOW (ref >60)
Glucose, Bld: 164 mg/dL — ABNORMAL HIGH (ref 70–99)
Potassium: 3.5 mmol/L (ref 3.5–5.1)
Sodium: 140 mmol/L (ref 135–145)

## 2021-04-24 LAB — POTASSIUM: Potassium: 4.2 mmol/L (ref 3.5–5.1)

## 2021-04-24 LAB — CBC
HCT: 30.4 % — ABNORMAL LOW (ref 36.0–46.0)
Hemoglobin: 10.1 g/dL — ABNORMAL LOW (ref 12.0–15.0)
MCH: 27.1 pg (ref 26.0–34.0)
MCHC: 33.2 g/dL (ref 30.0–36.0)
MCV: 81.5 fL (ref 80.0–100.0)
Platelets: 353 10*3/uL (ref 150–400)
RBC: 3.73 MIL/uL — ABNORMAL LOW (ref 3.87–5.11)
RDW: 15.4 % (ref 11.5–15.5)
WBC: 10.2 10*3/uL (ref 4.0–10.5)
nRBC: 0 % (ref 0.0–0.2)

## 2021-04-24 LAB — ECHOCARDIOGRAM COMPLETE
AR max vel: 1.84 cm2
AV Area VTI: 2.09 cm2
AV Area mean vel: 1.94 cm2
AV Mean grad: 4 mmHg
AV Peak grad: 7.6 mmHg
Ao pk vel: 1.38 m/s
Area-P 1/2: 3.61 cm2
Calc EF: 41.8 %
Height: 61 in
MV M vel: 5.32 m/s
MV Peak grad: 113.2 mmHg
MV VTI: 1.59 cm2
P 1/2 time: 345 msec
Radius: 0.5 cm
S' Lateral: 3.7 cm
Single Plane A2C EF: 37.5 %
Single Plane A4C EF: 49.1 %
Weight: 2444.46 oz

## 2021-04-24 LAB — POCT ACTIVATED CLOTTING TIME: Activated Clotting Time: 119 seconds

## 2021-04-24 LAB — TSH: TSH: 0.57 u[IU]/mL (ref 0.350–4.500)

## 2021-04-24 MED ORDER — FUROSEMIDE 10 MG/ML IJ SOLN
40.0000 mg | Freq: Every day | INTRAMUSCULAR | Status: DC
  Administered 2021-04-24 – 2021-04-25 (×2): 40 mg via INTRAVENOUS
  Filled 2021-04-24 (×2): qty 4

## 2021-04-24 MED ORDER — CARVEDILOL 6.25 MG PO TABS
9.3750 mg | ORAL_TABLET | Freq: Two times a day (BID) | ORAL | Status: DC
  Administered 2021-04-24 – 2021-04-27 (×6): 9.375 mg via ORAL
  Filled 2021-04-24 (×6): qty 1

## 2021-04-24 MED ORDER — GLUCERNA SHAKE PO LIQD
237.0000 mL | Freq: Three times a day (TID) | ORAL | Status: AC
  Administered 2021-04-24 – 2021-04-28 (×6): 237 mL via ORAL
  Filled 2021-04-24: qty 237

## 2021-04-24 MED ORDER — ISOSORBIDE MONONITRATE ER 30 MG PO TB24
30.0000 mg | ORAL_TABLET | Freq: Every day | ORAL | Status: DC
  Administered 2021-04-24 – 2021-05-11 (×18): 30 mg via ORAL
  Filled 2021-04-24 (×20): qty 1

## 2021-04-24 MED ORDER — HYDRALAZINE HCL 10 MG PO TABS
10.0000 mg | ORAL_TABLET | Freq: Three times a day (TID) | ORAL | Status: DC
  Administered 2021-04-24 – 2021-04-26 (×6): 10 mg via ORAL
  Filled 2021-04-24 (×6): qty 1

## 2021-04-24 MED ORDER — POTASSIUM CHLORIDE CRYS ER 20 MEQ PO TBCR
40.0000 meq | EXTENDED_RELEASE_TABLET | Freq: Two times a day (BID) | ORAL | Status: AC
  Administered 2021-04-24: 40 meq via ORAL
  Filled 2021-04-24: qty 2

## 2021-04-24 MED ORDER — CARVEDILOL 3.125 MG PO TABS
3.1250 mg | ORAL_TABLET | Freq: Once | ORAL | Status: AC
  Administered 2021-04-24: 3.125 mg via ORAL
  Filled 2021-04-24: qty 1

## 2021-04-24 NOTE — TOC Progression Note (Signed)
Transition of Care [facility]) - Progression Note    Patient Details  Name: Karen West MRN: [id number] Date of Birth: 02/28/1941  Transition of Care [facility]) CM/SW Contact  Zenon Mayo, RN Phone Number: 04/24/2021, 8:32 AM  Clinical Narrative:     Transition of Care [facility]) Screening Note   Patient Details  Name: Karen West Date of Birth: 1940/05/27   Transition of Care [facility]) CM/SW Contact:    Zenon Mayo, RN Phone Number: 04/24/2021, 8:32 AM    Transition of Care Department [facility]) has reviewed patient and no TOC needs have been identified at this time. We will continue to monitor patient advancement through interdisciplinary progression rounds. If new patient transition needs arise, please place a TOC consult.          Expected Discharge Plan and Services                                                 Social Determinants of Health (SDOH) Interventions    Readmission Risk Interventions No flowsheet data found.

## 2021-04-24 NOTE — Plan of Care (Signed)
°  Problem: Education: Goal: Knowledge of General Education information will improve Description: Including pain rating scale, medication(s)/side effects and non-pharmacologic comfort measures Outcome: Progressing   Problem: Health Behavior/Discharge Planning: Goal: Ability to manage health-related needs will improve Outcome: Progressing   Problem: Clinical Measurements: Goal: Ability to maintain clinical measurements within normal limits will improve Outcome: Progressing Goal: Will remain free from infection Outcome: Progressing Goal: Diagnostic test results will improve Outcome: Progressing Goal: Respiratory complications will improve Outcome: Progressing Goal: Cardiovascular complication will be avoided Outcome: Progressing   Problem: Activity: Goal: Risk for activity intolerance will decrease Outcome: Progressing   Problem: Nutrition: Goal: Adequate nutrition will be maintained Outcome: Progressing   Problem: Coping: Goal: Level of anxiety will decrease Outcome: Progressing   Problem: Pain Managment: Goal: General experience of comfort will improve Outcome: Progressing   Problem: Safety: Goal: Ability to remain free from injury will improve Outcome: Progressing

## 2021-04-24 NOTE — Progress Notes (Signed)
°  Echocardiogram 2D Echocardiogram has been performed.  Karen West 04/24/2021, 9:37 AM

## 2021-04-24 NOTE — Progress Notes (Addendum)
Progress Note  Patient Name: Karen West Date of Encounter: 04/24/2021  Primary Cardiologist: Minus Breeding, MD  Subjective   Chest pain improved since yesterday.  Did have some left lower quadrant discomfort this morning.  Also short of breath overnight.  Inpatient Medications    Scheduled Meds:  aspirin  81 mg Oral Daily   carvedilol  6.25 mg Oral BID WC   Chlorhexidine Gluconate Cloth  6 each Topical Daily   ferrous sulfate  325 mg Oral BID WC   levothyroxine  88 mcg Oral Q0600   pantoprazole  40 mg Oral Daily   potassium chloride  40 mEq Oral BID   sodium chloride flush  3 mL Intravenous Q12H   ticagrelor  90 mg Oral BID   Continuous Infusions:  sodium chloride 10 mL/hr at 04/23/21 1021   sodium chloride     PRN Meds: sodium chloride, acetaminophen, diazepam, labetalol, nitroGLYCERIN, sodium chloride flush   Vital Signs    Vitals:   04/24/21 0600 04/24/21 0630 04/24/21 0700 04/24/21 0800  BP: (!) 149/82  (!) 146/80 (!) 152/87  Pulse:   87 88  Resp: (!) 25 (!) 23 (!) 23 19  Temp:      TempSrc:      SpO2: 92% 90% 92% 93%  Weight:  69.3 kg    Height:        Intake/Output Summary (Last 24 hours) at 04/24/2021 0839 Last data filed at 04/24/2021 0700 Gross per 24 hour  Intake 1598.82 ml  Output 1225 ml  Net 373.82 ml   Filed Weights   04/23/21 0947 04/23/21 1135 04/24/21 0630  Weight: 68 kg 69.9 kg 69.3 kg    Telemetry    Sinus rhythm with bursts of NSVT.  Personally reviewed.  ECG    An ECG dated 04/24/2021 was personally reviewed today and demonstrated:  Sinus rhythm with residual ST segment changes consistent with recent inferolateral STEMI.  Physical Exam   GEN: No acute distress.   Neck: No JVD. Cardiac: RRR, 3/6 apical systolic murmur consistent with MR, no gallop.  Respiratory: Nonlabored.  Crackles at bases. GI: Soft, nontender, bowel sounds decreased. MS: No edema; No deformity. Neuro:  Nonfocal. Psych: Alert and oriented x 3. Normal  affect.  Labs    Chemistry Recent Labs  Lab 04/23/21 1000 04/24/21 0625  NA 134* 140  K 4.1 3.5  CL 105 108  CO2 20* 16*  GLUCOSE 180* 164*  BUN 41* 37*  CREATININE 1.49* 1.60*  CALCIUM 9.4 8.5*  PROT 8.2*  --   ALBUMIN 3.8  --   AST 63*  --   ALT 16  --   ALKPHOS 58  --   BILITOT 0.5  --   GFRNONAA 36* 33*  ANIONGAP 9 16*     Hematology Recent Labs  Lab 04/23/21 1000 04/24/21 0625  WBC 9.2 10.2  RBC 4.17 3.73*  HGB 11.3* 10.1*  HCT 34.8* 30.4*  MCV 83.5 81.5  MCH 27.1 27.1  MCHC 32.5 33.2  RDW 14.9 15.4  PLT 403* 353    Cardiac Enzymes Recent Labs  Lab 04/23/21 1000 04/23/21 1222  TROPONINIHS 22,485* >24,000*    Radiology    CARDIAC CATHETERIZATION  Result Date: 04/23/2021   Prox LAD to Mid LAD lesion is 70% stenosed.   Prox LAD lesion is 50% stenosed.   Mid Cx lesion is 100% stenosed.   Dist Cx lesion is 80% stenosed.   3rd Mrg lesion is 100% stenosed.  Non-stenotic Dist RCA lesion was previously treated.   Non-stenotic Mid RCA to Dist RCA lesion was previously treated.   A drug-eluting stent was successfully placed.   Post intervention, there is a 0% residual stenosis.   Post intervention, there is a 0% residual stenosis.   There is moderate left ventricular systolic dysfunction. Acute inferolateral wall ST segment elevation secondary to total occlusion of a large left circumflex coronary artery with TIMI 0 flow. Calcification involving the proximal LAD with 50 and 70% stenoses. Widely patent dominant RCA with previously placed 4 tandem stents proximally to distally without restenosis. Moderate acute LV dysfunction with EF estimated at 35 to 40% with hypocontractility involving the mid distal anterolateral wall.  LVEDP 30 mm Successful percutaneous coronary intervention to the left circumflex coronary artery with ultimate insertion of a 2.75 x 26 mm Medtronic Onyx frontiers stent postdilated to 3.04 mm with the 100% occlusion being reduced to 0% and brisk  TIMI-3 flow in the major vessel.  There is residual thrombus and a small marginal branch arising from the stented segment which was totally occluded initially.  There also was faint thrombus in a very distal branch of the circumflex.  The plan is to continue Aggrastat for 18 hours post procedure. RECOMMENDATION: DAPT indefinitely.  We will continue Aggrastat 18 hours postinfusion.  Optimize blood pressure.  Plan 2D echo Doppler study.  Continue carvedilol, consider ARB and transition to [facility] if LV function remains impaired.  Continue Praluent/Zetia with statin intolerance and probable familial hyperlipidemia.   DG Chest Port 1 View  Result Date: 04/23/2021 CLINICAL DATA:  Chest pain EXAM: PORTABLE CHEST 1 VIEW COMPARISON:  Previous studies including the chest radiograph done on 12/22/2020 FINDINGS: Transverse diameter of heart is increased. Central pulmonary vessels are more prominent. Increased interstitial markings are seen in the parahilar regions and lower lung fields. Dense calcification is seen in the mitral annulus. There is blunting of left lateral CP angle. There is no pneumothorax. Surgical clips are seen in the right axilla and right chest wall. IMPRESSION: Cardiomegaly. Central pulmonary vessels are more prominent suggesting CHF. Increased interstitial markings are seen in the parahilar regions and lower lung fields suggesting interstitial pulmonary edema or interstitial pneumonitis. Possible small left pleural effusion. Electronically Signed   By: Elmer Picker M.D.   On: 04/23/2021 10:23    Cardiac Studies   Echocardiogram pending.  Assessment & Plan    1.  Inferolateral STEMI status post DES to culprit occlusion of the mid circumflex.  Residual thrombus noted in small obtuse marginal and also distal circumflex.  Patient on Aggrastat for 18 hours postprocedure.  Prior stent sites x4 in RCA were widely patent.  Follow-up ECG with improvement in ST segment changes although not  complete resolution.  LVEF described as 35 to 40% range at angiography.  Cardiac murmur consistent with mitral regurgitation, echocardiogram pending.  2.  Mixed hyperlipidemia, on Praluent and Zetia as an outpatient with history of statin intolerance.  Uncertain about consistency with use given recent LDL 114 and previously 61.  3.  Essential hypertension, recent systolic blood pressure 812X to 150s.  4.  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  Discussed with nursing.  Patient getting echocardiogram this morning.  Continue aspirin and Brilinta, uptitrate Coreg.  We will also give dose of IV Lasix today and follow response.  Supplement potassium.  She has an ACE inhibitor intolerance listed (renal failure), does not necessarily sound like true allergy but need  to be cautious particularly with current renal function.  Will hold off on ARB and start hydralazine instead along with Imdur.  Would keep in unit.  Signed, Rozann Lesches, MD  04/24/2021, 8:39 AM

## 2021-04-25 ENCOUNTER — Other Ambulatory Visit (HOSPITAL_COMMUNITY): Payer: Self-pay

## 2021-04-25 ENCOUNTER — Encounter (HOSPITAL_COMMUNITY): Payer: Self-pay | Admitting: Cardiovascular Disease

## 2021-04-25 DIAGNOSIS — I2121 ST elevation (STEMI) myocardial infarction involving left circumflex coronary artery: Secondary | ICD-10-CM | POA: Diagnosis not present

## 2021-04-25 DIAGNOSIS — N179 Acute kidney failure, unspecified: Secondary | ICD-10-CM

## 2021-04-25 DIAGNOSIS — I340 Nonrheumatic mitral (valve) insufficiency: Secondary | ICD-10-CM | POA: Diagnosis not present

## 2021-04-25 DIAGNOSIS — I5021 Acute systolic (congestive) heart failure: Secondary | ICD-10-CM

## 2021-04-25 DIAGNOSIS — R7303 Prediabetes: Secondary | ICD-10-CM

## 2021-04-25 LAB — BASIC METABOLIC PANEL
Anion gap: 13 (ref 5–15)
BUN: 44 mg/dL — ABNORMAL HIGH (ref 8–23)
CO2: 17 mmol/L — ABNORMAL LOW (ref 22–32)
Calcium: 8.1 mg/dL — ABNORMAL LOW (ref 8.9–10.3)
Chloride: 105 mmol/L (ref 98–111)
Creatinine, Ser: 1.78 mg/dL — ABNORMAL HIGH (ref 0.44–1.00)
GFR, Estimated: 29 mL/min — ABNORMAL LOW (ref >60)
Glucose, Bld: 170 mg/dL — ABNORMAL HIGH (ref 70–99)
Potassium: 3.6 mmol/L (ref 3.5–5.1)
Sodium: 135 mmol/L (ref 135–145)

## 2021-04-25 LAB — POCT I-STAT, CHEM 8
BUN: 38 mg/dL — ABNORMAL HIGH (ref 8–23)
Calcium, Ion: 1.27 mmol/L (ref 1.15–1.40)
Chloride: 105 mmol/L (ref 98–111)
Creatinine, Ser: 1.2 mg/dL — ABNORMAL HIGH (ref 0.44–1.00)
Glucose, Bld: 160 mg/dL — ABNORMAL HIGH (ref 70–99)
HCT: 33 % — ABNORMAL LOW (ref 36.0–46.0)
Hemoglobin: 11.2 g/dL — ABNORMAL LOW (ref 12.0–15.0)
Potassium: 3.8 mmol/L (ref 3.5–5.1)
Sodium: 137 mmol/L (ref 135–145)
TCO2: 20 mmol/L — ABNORMAL LOW (ref 22–32)

## 2021-04-25 LAB — GLUCOSE, CAPILLARY
Glucose-Capillary: 302 mg/dL — ABNORMAL HIGH (ref 70–99)
Glucose-Capillary: 187 mg/dL — ABNORMAL HIGH (ref 70–99)
Glucose-Capillary: 142 mg/dL — ABNORMAL HIGH (ref 70–99)
Glucose-Capillary: 130 mg/dL — ABNORMAL HIGH (ref 70–99)

## 2021-04-25 LAB — HEMOGLOBIN A1C
Hgb A1c MFr Bld: 6 % — ABNORMAL HIGH (ref 4.8–5.6)
Mean Plasma Glucose: 125.5 mg/dL

## 2021-04-25 LAB — CBC
HCT: 28 % — ABNORMAL LOW (ref 36.0–46.0)
Hemoglobin: 9.1 g/dL — ABNORMAL LOW (ref 12.0–15.0)
MCH: 26.7 pg (ref 26.0–34.0)
MCHC: 32.5 g/dL (ref 30.0–36.0)
MCV: 82.1 fL (ref 80.0–100.0)
Platelets: 314 10*3/uL (ref 150–400)
RBC: 3.41 MIL/uL — ABNORMAL LOW (ref 3.87–5.11)
RDW: 15.5 % (ref 11.5–15.5)
WBC: 12.5 10*3/uL — ABNORMAL HIGH (ref 4.0–10.5)
nRBC: 0.2 % (ref 0.0–0.2)

## 2021-04-25 MED ORDER — INSULIN ASPART 100 UNIT/ML IJ SOLN: 0.0000 [IU] | Freq: Three times a day (TID) | INTRAMUSCULAR | Status: DC

## 2021-04-25 MED ORDER — FUROSEMIDE 10 MG/ML IJ SOLN
40.0000 mg | Freq: Once | INTRAMUSCULAR | Status: AC
  Administered 2021-04-25: 40 mg via INTRAVENOUS
  Filled 2021-04-25: qty 4

## 2021-04-25 MED ORDER — ENOXAPARIN SODIUM 30 MG/0.3ML IJ SOSY
30.0000 mg | PREFILLED_SYRINGE | INTRAMUSCULAR | Status: DC
  Administered 2021-04-25 – 2021-05-06 (×12): 30 mg via SUBCUTANEOUS
  Filled 2021-04-25 (×12): qty 0.3

## 2021-04-25 MED ORDER — INSULIN ASPART 100 UNIT/ML IJ SOLN
0.0000 [IU] | Freq: Three times a day (TID) | INTRAMUSCULAR | Status: DC
  Administered 2021-04-25: 2 [IU] via SUBCUTANEOUS
  Administered 2021-04-25: 11 [IU] via SUBCUTANEOUS
  Administered 2021-04-26 (×3): 2 [IU] via SUBCUTANEOUS
  Administered 2021-04-27: 3 [IU] via SUBCUTANEOUS
  Administered 2021-04-27: 2 [IU] via SUBCUTANEOUS
  Administered 2021-04-27: 13:00:00 3 [IU] via SUBCUTANEOUS
  Administered 2021-04-28 (×2): 8 [IU] via SUBCUTANEOUS
  Administered 2021-04-28: 3 [IU] via SUBCUTANEOUS
  Administered 2021-04-29: 17:00:00 8 [IU] via SUBCUTANEOUS
  Administered 2021-04-29: 07:00:00 2 [IU] via SUBCUTANEOUS
  Administered 2021-04-29: 12:00:00 8 [IU] via SUBCUTANEOUS
  Administered 2021-04-30: 5 [IU] via SUBCUTANEOUS
  Administered 2021-04-30: 17:00:00 11 [IU] via SUBCUTANEOUS
  Administered 2021-04-30: 07:00:00 2 [IU] via SUBCUTANEOUS
  Administered 2021-05-01 (×2): 8 [IU] via SUBCUTANEOUS
  Administered 2021-05-02 (×2): 2 [IU] via SUBCUTANEOUS
  Administered 2021-05-02 – 2021-05-03 (×4): 3 [IU] via SUBCUTANEOUS
  Administered 2021-05-04: 5 [IU] via SUBCUTANEOUS
  Administered 2021-05-04 (×2): 3 [IU] via SUBCUTANEOUS
  Administered 2021-05-05: 5 [IU] via SUBCUTANEOUS
  Administered 2021-05-05 – 2021-05-06 (×3): 3 [IU] via SUBCUTANEOUS
  Administered 2021-05-06: 5 [IU] via SUBCUTANEOUS
  Administered 2021-05-07: 2 [IU] via SUBCUTANEOUS
  Administered 2021-05-07: 8 [IU] via SUBCUTANEOUS
  Administered 2021-05-07 – 2021-05-08 (×3): 3 [IU] via SUBCUTANEOUS
  Administered 2021-05-08: 5 [IU] via SUBCUTANEOUS
  Administered 2021-05-09: 3 [IU] via SUBCUTANEOUS
  Administered 2021-05-09: 8 [IU] via SUBCUTANEOUS
  Administered 2021-05-10: 2 [IU] via SUBCUTANEOUS
  Administered 2021-05-10: 5 [IU] via SUBCUTANEOUS
  Administered 2021-05-10: 8 [IU] via SUBCUTANEOUS
  Administered 2021-05-11 (×2): 2 [IU] via SUBCUTANEOUS
  Administered 2021-05-11 – 2021-05-12 (×3): 5 [IU] via SUBCUTANEOUS
  Administered 2021-05-12 – 2021-05-13 (×3): 3 [IU] via SUBCUTANEOUS
  Administered 2021-05-13: 8 [IU] via SUBCUTANEOUS
  Administered 2021-05-14 (×3): 3 [IU] via SUBCUTANEOUS
  Administered 2021-05-15: 5 [IU] via SUBCUTANEOUS
  Administered 2021-05-15: 8 [IU] via SUBCUTANEOUS
  Administered 2021-05-15 – 2021-05-16 (×2): 3 [IU] via SUBCUTANEOUS
  Administered 2021-05-16: 5 [IU] via SUBCUTANEOUS
  Administered 2021-05-16: 2 [IU] via SUBCUTANEOUS
  Administered 2021-05-17 (×2): 8 [IU] via SUBCUTANEOUS
  Administered 2021-05-17 – 2021-05-18 (×2): 3 [IU] via SUBCUTANEOUS

## 2021-04-25 MED ORDER — POTASSIUM CHLORIDE CRYS ER 20 MEQ PO TBCR
40.0000 meq | EXTENDED_RELEASE_TABLET | Freq: Once | ORAL | Status: AC
  Administered 2021-04-25: 40 meq via ORAL
  Filled 2021-04-25: qty 2

## 2021-04-25 NOTE — Progress Notes (Signed)
CARDIAC REHAB PHASE I   PRE:  Rate/Rhythm: 81 SR    BP: sitting 104/67    SaO2: 100 3L, 99 2L, 95 RA  MODE:  Ambulation: 100 ft   POST:  Rate/Rhythm: 97 SR then 150 SVT brief    BP: sitting 125/97     SaO2: 94 3L (had just put on)  Pt eager to ambulate. Sts she is SOB at baseline, probably due to lung surgery in the past. saO2 95 RA before walking. C/o right groin pain upon standing therefore used RW. Slow and steady. SaO2 100 RA after 10 ft in hall however with distance SaO2 would not register. Pt tired with distance. Also c/o groin soreness with some relief with distance. Upon return to recliner pt suddenly had brief SVT 150. C/o dizziness. Resolved on its own. Reapplied O2 during this episode. Pt surprised at her fatigue with walking. RN present. Will f/u tomorrow for more ambulation. [place], ACSM 04/25/2021 2:17 PM

## 2021-04-25 NOTE — Progress Notes (Addendum)
Progress Note  Patient Name: Karen West Date of Encounter: 04/25/2021  [facility] Cardiologist: Minus Breeding, MD   Subjective   No CP. SHe was short of breath but this has improved with diuresis.   Inpatient Medications    Scheduled Meds:  aspirin  81 mg Oral Daily   carvedilol  9.375 mg Oral BID WC   Chlorhexidine Gluconate Cloth  6 each Topical Daily   feeding supplement (GLUCERNA SHAKE)  237 mL Oral TID BM   ferrous sulfate  325 mg Oral BID WC   furosemide  40 mg Intravenous Daily   hydrALAZINE  10 mg Oral Q8H   insulin aspart  0-15 Units Subcutaneous TID WC   isosorbide mononitrate  30 mg Oral Daily   levothyroxine  88 mcg Oral Q0600   pantoprazole  40 mg Oral Daily   sodium chloride flush  3 mL Intravenous Q12H   ticagrelor  90 mg Oral BID   Continuous Infusions:  sodium chloride 10 mL/hr at 04/23/21 1021   sodium chloride     PRN Meds: sodium chloride, acetaminophen, diazepam, labetalol, nitroGLYCERIN, sodium chloride flush   Vital Signs    Vitals:   04/25/21 0600 04/25/21 0700 04/25/21 0751 04/25/21 0800  BP: 136/82 132/78  124/60  Pulse:      Resp: (!) 21 17  (!) 21  Temp:   98 F (36.7 C)   TempSrc:   Oral   SpO2: 94% 94%  96%  Weight:      Height:        Intake/Output Summary (Last 24 hours) at 04/25/2021 0857 Last data filed at 04/25/2021 0545 Gross per 24 hour  Intake 780 ml  Output 1875 ml  Net -1095 ml   Last 3 Weights 04/25/2021 04/24/2021 04/23/2021  Weight (lbs) 143 lb 11.8 oz 152 lb 12.5 oz 154 lb 1.6 oz  Weight (kg) 65.2 kg 69.3 kg 69.9 kg      Telemetry    NSR - Personally Reviewed  ECG      Physical Exam   GEN: No acute distress.  frail Neck: No JVD Cardiac: RRR, 3/6 systolic murmurs, no rubs, or gallops.  Respiratory: Bibasilar crackles to auscultation bilaterally. GI: Soft, nontender, non-distended  MS: No edema; No deformity. No right groin hematoma; 2+ right DP pulse Neuro:  Nonfocal  Psych: Normal affect    Labs    High Sensitivity Troponin:   Recent Labs  Lab 04/23/21 1000 04/23/21 1222  TROPONINIHS 22,485* >24,000*     Chemistry Recent Labs  Lab 04/23/21 1000 04/23/21 1042 04/24/21 0625 04/24/21 1455 04/25/21 0252  NA 134* 137 140  --  135  K 4.1 3.8 3.5 4.2 3.6  CL 105 105 108  --  105  CO2 20*  --  16*  --  17*  GLUCOSE 180* 160* 164*  --  170*  BUN 41* 38* 37*  --  44*  CREATININE 1.49* 1.20* 1.60*  --  1.78*  CALCIUM 9.4  --  8.5*  --  8.1*  PROT 8.2*  --   --   --   --   ALBUMIN 3.8  --   --   --   --   AST 63*  --   --   --   --   ALT 16  --   --   --   --   ALKPHOS 58  --   --   --   --   BILITOT 0.5  --   --   --   --  GFRNONAA 36*  --  33*  --  29*  ANIONGAP 9  --  16*  --  13    Lipids  Recent Labs  Lab 04/23/21 1000  CHOL 189  TRIG 90  HDL 57  LDLCALC 114*  CHOLHDL 3.3    Hematology Recent Labs  Lab 04/23/21 1000 04/23/21 1042 04/24/21 0625 04/25/21 0252  WBC 9.2  --  10.2 12.5*  RBC 4.17  --  3.73* 3.41*  HGB 11.3* 11.2* 10.1* 9.1*  HCT 34.8* 33.0* 30.4* 28.0*  MCV 83.5  --  81.5 82.1  MCH 27.1  --  27.1 26.7  MCHC 32.5  --  33.2 32.5  RDW 14.9  --  15.4 15.5  PLT 403*  --  353 314   Thyroid  Recent Labs  Lab 04/24/21 0625  TSH 0.570    BNPNo results for input(s): BNP, PROBNP in the last 168 hours.  DDimer No results for input(s): DDIMER in the last 168 hours.   Radiology    CARDIAC CATHETERIZATION  Result Date: 04/23/2021   Prox LAD to Mid LAD lesion is 70% stenosed.   Prox LAD lesion is 50% stenosed.   Mid Cx lesion is 100% stenosed.   Dist Cx lesion is 80% stenosed.   3rd Mrg lesion is 100% stenosed.   Non-stenotic Dist RCA lesion was previously treated.   Non-stenotic Mid RCA to Dist RCA lesion was previously treated.   A drug-eluting stent was successfully placed.   Post intervention, there is a 0% residual stenosis.   Post intervention, there is a 0% residual stenosis.   There is moderate left ventricular systolic  dysfunction. Acute inferolateral wall ST segment elevation secondary to total occlusion of a large left circumflex coronary artery with TIMI 0 flow. Calcification involving the proximal LAD with 50 and 70% stenoses. Widely patent dominant RCA with previously placed 4 tandem stents proximally to distally without restenosis. Moderate acute LV dysfunction with EF estimated at 35 to 40% with hypocontractility involving the mid distal anterolateral wall.  LVEDP 30 mm Successful percutaneous coronary intervention to the left circumflex coronary artery with ultimate insertion of a 2.75 x 26 mm Medtronic Onyx frontiers stent postdilated to 3.04 mm with the 100% occlusion being reduced to 0% and brisk TIMI-3 flow in the major vessel.  There is residual thrombus and a small marginal branch arising from the stented segment which was totally occluded initially.  There also was faint thrombus in a very distal branch of the circumflex.  The plan is to continue Aggrastat for 18 hours post procedure. RECOMMENDATION: DAPT indefinitely.  We will continue Aggrastat 18 hours postinfusion.  Optimize blood pressure.  Plan 2D echo Doppler study.  Continue carvedilol, consider ARB and transition to [facility] if LV function remains impaired.  Continue Praluent/Zetia with statin intolerance and probable familial hyperlipidemia.   DG Chest Port 1 View  Result Date: 04/23/2021 CLINICAL DATA:  Chest pain EXAM: PORTABLE CHEST 1 VIEW COMPARISON:  Previous studies including the chest radiograph done on 12/22/2020 FINDINGS: Transverse diameter of heart is increased. Central pulmonary vessels are more prominent. Increased interstitial markings are seen in the parahilar regions and lower lung fields. Dense calcification is seen in the mitral annulus. There is blunting of left lateral CP angle. There is no pneumothorax. Surgical clips are seen in the right axilla and right chest wall. IMPRESSION: Cardiomegaly. Central pulmonary vessels are more  prominent suggesting CHF. Increased interstitial markings are seen in the parahilar regions and lower lung  fields suggesting interstitial pulmonary edema or interstitial pneumonitis. Possible small left pleural effusion. Electronically Signed   By: Elmer Picker M.D.   On: 04/23/2021 10:23   ECHOCARDIOGRAM COMPLETE  Result Date: 04/24/2021    ECHOCARDIOGRAM REPORT   Patient Name:   Karen West Date of Exam: 04/24/2021 Medical Rec #:  [id number]        Height:       61.0 in Accession #:    [id number]       Weight:       152.8 lb Date of Birth:  [birth year]         BSA:          1.684 m Patient Age:    81 years         BP:           146/80 mmHg Patient Gender: F                HR:           89 bpm. Exam Location:  Inpatient Procedure: 2D Echo, Cardiac Doppler, Color Doppler and 3D Echo Indications:    Acute myocardial infarction  History:        Patient has prior history of Echocardiogram examinations, most                 recent 08/02/2020. Arrythmias:RBBB, Signs/Symptoms:Chest Pain;                 Risk Factors:Hypertension, Dyslipidemia, Diabetes and Sleep                 Apnea. S/P PCI. CKD.  Sonographer:    Clayton Lefort RDCS (AE) Referring Phys: [place]  1. Septal , apical, lateral and posterior lateral hypokinesis . Left ventricular ejection fraction, by estimation, is 30 to 35%. The left ventricle has moderately decreased function. The left ventricle demonstrates regional wall motion abnormalities (see scoring diagram/findings for description). The left ventricular internal cavity size was moderately dilated. There is mild left ventricular hypertrophy. Left ventricular diastolic parameters are indeterminate.  2. Right ventricular systolic function is normal. The right ventricular size is normal. There is moderately elevated pulmonary artery systolic pressure.  3. Left atrial size was mildly dilated.  4. The mitral valve is abnormal. Moderate to severe mitral valve regurgitation. No  evidence of mitral stenosis. Severe mitral annular calcification.  5. Tricuspid valve regurgitation is moderate.  6. The aortic valve is normal in structure. There is moderate calcification of the aortic valve. There is moderate thickening of the aortic valve. Aortic valve regurgitation is mild to moderate. Mild aortic valve stenosis.  7. The inferior vena cava is normal in size with greater than 50% respiratory variability, suggesting right atrial pressure of 3 mmHg. FINDINGS  Left Ventricle: Septal , apical, lateral and posterior lateral hypokinesis. Left ventricular ejection fraction, by estimation, is 30 to 35%. The left ventricle has moderately decreased function. The left ventricle demonstrates regional wall motion abnormalities. The left ventricular internal cavity size was moderately dilated. There is mild left ventricular hypertrophy. Left ventricular diastolic parameters are indeterminate. Right Ventricle: The right ventricular size is normal. No increase in right ventricular wall thickness. Right ventricular systolic function is normal. There is moderately elevated pulmonary artery systolic pressure. The tricuspid regurgitant velocity is 3.48 m/s, and with an assumed right atrial pressure of 8 mmHg, the estimated right ventricular systolic pressure is 25.3 mmHg. Left Atrium: Left atrial size was mildly dilated. Right Atrium:  Right atrial size was normal in size. Pericardium: There is no evidence of pericardial effusion. Mitral Valve: The mitral valve is abnormal. There is severe thickening of the mitral valve leaflet(s). There is severe calcification of the mitral valve leaflet(s). Severe mitral annular calcification. Moderate to severe mitral valve regurgitation. No evidence of mitral valve stenosis. MV peak gradient, 8.0 mmHg. The mean mitral valve gradient is 3.0 mmHg. Tricuspid Valve: The tricuspid valve is normal in structure. Tricuspid valve regurgitation is moderate . No evidence of tricuspid  stenosis. Aortic Valve: The aortic valve is normal in structure. There is moderate calcification of the aortic valve. There is moderate thickening of the aortic valve. Aortic valve regurgitation is mild to moderate. Aortic regurgitation PHT measures 345 msec. Mild  aortic stenosis is present. Aortic valve mean gradient measures 4.0 mmHg. Aortic valve peak gradient measures 7.6 mmHg. Aortic valve area, by VTI measures 2.09 cm. Pulmonic Valve: The pulmonic valve was normal in structure. Pulmonic valve regurgitation is not visualized. No evidence of pulmonic stenosis. Aorta: The aortic root is normal in size and structure. Venous: The inferior vena cava is normal in size with greater than 50% respiratory variability, suggesting right atrial pressure of 3 mmHg. IAS/Shunts: No atrial level shunt detected by color flow Doppler.  LEFT VENTRICLE PLAX 2D LVIDd:         4.30 cm     Diastology LVIDs:         3.70 cm     LV e' medial:    5.44 cm/s LV PW:         1.30 cm     LV E/e' medial:  18.0 LV IVS:        1.20 cm     LV e' lateral:   4.90 cm/s LVOT diam:     2.00 cm     LV E/e' lateral: 20.0 LV SV:         47 LV SV Index:   28 LVOT Area:     3.14 cm                             3D Volume EF: LV Volumes (MOD)           3D EF:        42 % LV vol d, MOD A2C: 67.5 ml LV EDV:       103 ml LV vol d, MOD A4C: 80.9 ml LV ESV:       60 ml LV vol s, MOD A2C: 42.2 ml LV SV:        43 ml LV vol s, MOD A4C: 41.2 ml LV SV MOD A2C:     25.3 ml LV SV MOD A4C:     80.9 ml LV SV MOD BP:      30.9 ml RIGHT VENTRICLE             IVC RV Basal diam:  3.00 cm     IVC diam: 1.50 cm RV S prime:     12.60 cm/s TAPSE (M-mode): 1.6 cm LEFT ATRIUM             Index        RIGHT ATRIUM          Index LA diam:        4.00 cm 2.37 cm/m   RA Area:     8.49 cm LA Vol (A2C):   41.8 ml 24.81 ml/m  RA Volume:   16.20 ml 9.62 ml/m LA Vol (A4C):   68.0 ml 40.37 ml/m LA Biplane Vol: 54.3 ml 32.24 ml/m  AORTIC VALVE AV Area (Vmax):    1.84 cm AV Area  (Vmean):   1.94 cm AV Area (VTI):     2.09 cm AV Vmax:           138.00 cm/s AV Vmean:          91.700 cm/s AV VTI:            0.225 m AV Peak Grad:      7.6 mmHg AV Mean Grad:      4.0 mmHg LVOT Vmax:         80.90 cm/s LVOT Vmean:        56.600 cm/s LVOT VTI:          0.150 m LVOT/AV VTI ratio: 0.67 AI PHT:            345 msec  AORTA Ao Root diam: 2.70 cm Ao Asc diam:  3.40 cm MITRAL VALVE                  TRICUSPID VALVE MV Area (PHT): 3.61 cm       TR Peak grad:   48.4 mmHg MV Area VTI:   1.59 cm       TR Vmax:        348.00 cm/s MV Peak grad:  8.0 mmHg MV Mean grad:  3.0 mmHg       SHUNTS MV Vmax:       1.41 m/s       Systemic VTI:  0.15 m MV Vmean:      81.1 cm/s      Systemic Diam: 2.00 cm MV Decel Time: 210 msec MR Peak grad:    113.2 mmHg MR Mean grad:    81.0 mmHg MR Vmax:         532.00 cm/s MR Vmean:        428.0 cm/s MR PISA:         1.57 cm MR PISA Eff ROA: 11 mm MR PISA Radius:  0.50 cm MV E velocity: 97.90 cm/s MV A velocity: 116.00 cm/s MV E/A ratio:  0.84 [name] MD Electronically signed by [name] MD Signature Date/Time: 04/24/2021/11:05:56 AM    Final     Cardiac Studies   Cath films personally reviewed  Patient Profile     81 y.o. female with inferoposterior MI.  Successful PCI of the circumflex.  Now with severe mitral regurgitation by echocardiogram.  Assessment & Plan    CAD/MI: Continue dual antiplatelet therapy.  Continue aggressive secondary prevention.  Given the acute mitral regurgitation in the setting of ischemia, she does need diuresis.  LVEDP was 32 at the time of cath which goes along with volume overload.  Her symptoms have responded to diuresis.  Moderate disease noted in the LAD.  Would manage this medically.   Acute renal failure: She will get Lasix this morning.  We will hold Lasix after that see where her creatinine settles out.  She does feel like she cannot lie flat more.  Mitral regurgitation: We will have structural team look at her mitral  valve to see if there are any less invasive interventional options for her.  Prediabetes: A1c 6.1.  Long-term, she will need a healthy diet.  We will see how she does getting out of bed.  If she is able to tolerate more activity,  can consider moving her out of the ICU.   For questions or updates, please contact [place] Please consult [email or web address] for contact info under        Signed, Larae Grooms, MD  04/25/2021, 8:57 AM    Addendum: updated daughter, Anderson Malta, by phone.  All questions answered.    Jettie Booze, MD

## 2021-04-25 NOTE — TOC Benefit Eligibility Note (Signed)
Patient Teacher, English as a foreign language completed.    The patient is currently admitted and upon discharge could be taking Brilinta 90 mg.  The current 30 day co-pay is, $47.00.   The patient is insured through [place], [place] Patient Advocate Specialist [place] Patient Advocate Team Direct Number: [phone]  Fax: [phone]

## 2021-04-25 NOTE — Progress Notes (Signed)
Pt just received lasix and was significantly SOB getting to recliner with RN therefore will walk with pt later today. Discussed with pt MI, restrictions, stent, Brilinta importance, diet, NTG, and CRPII. Pt very receptive, asking appropriate questions. Will refer to [place]. Will f/u for reiteration and ambulation. [id number] [place], ACSM 11:14 AM 04/25/2021

## 2021-04-26 DIAGNOSIS — I2121 ST elevation (STEMI) myocardial infarction involving left circumflex coronary artery: Secondary | ICD-10-CM | POA: Diagnosis not present

## 2021-04-26 DIAGNOSIS — I340 Nonrheumatic mitral (valve) insufficiency: Secondary | ICD-10-CM | POA: Diagnosis not present

## 2021-04-26 DIAGNOSIS — N179 Acute kidney failure, unspecified: Secondary | ICD-10-CM | POA: Diagnosis not present

## 2021-04-26 DIAGNOSIS — R7303 Prediabetes: Secondary | ICD-10-CM | POA: Diagnosis not present

## 2021-04-26 LAB — GLUCOSE, CAPILLARY
Glucose-Capillary: 137 mg/dL — ABNORMAL HIGH (ref 70–99)
Glucose-Capillary: 154 mg/dL — ABNORMAL HIGH (ref 70–99)
Glucose-Capillary: 190 mg/dL — ABNORMAL HIGH (ref 70–99)
Glucose-Capillary: 143 mg/dL — ABNORMAL HIGH (ref 70–99)

## 2021-04-26 LAB — CBC
HCT: 27 % — ABNORMAL LOW (ref 36.0–46.0)
Hemoglobin: 8.9 g/dL — ABNORMAL LOW (ref 12.0–15.0)
MCH: 27.1 pg (ref 26.0–34.0)
MCHC: 33 g/dL (ref 30.0–36.0)
MCV: 82.1 fL (ref 80.0–100.0)
Platelets: 264 10*3/uL (ref 150–400)
RBC: 3.29 MIL/uL — ABNORMAL LOW (ref 3.87–5.11)
RDW: 15.5 % (ref 11.5–15.5)
WBC: 11.2 10*3/uL — ABNORMAL HIGH (ref 4.0–10.5)
nRBC: 0.2 % (ref 0.0–0.2)

## 2021-04-26 LAB — BASIC METABOLIC PANEL
Anion gap: 15 (ref 5–15)
BUN: 61 mg/dL — ABNORMAL HIGH (ref 8–23)
CO2: 17 mmol/L — ABNORMAL LOW (ref 22–32)
Calcium: 8 mg/dL — ABNORMAL LOW (ref 8.9–10.3)
Chloride: 103 mmol/L (ref 98–111)
Creatinine, Ser: 2.55 mg/dL — ABNORMAL HIGH (ref 0.44–1.00)
GFR, Estimated: 19 mL/min — ABNORMAL LOW (ref >60)
Glucose, Bld: 131 mg/dL — ABNORMAL HIGH (ref 70–99)
Potassium: 4.1 mmol/L (ref 3.5–5.1)
Sodium: 135 mmol/L (ref 135–145)

## 2021-04-26 MED FILL — Verapamil HCl IV Soln 2.5 MG/ML: INTRAVENOUS | Qty: 2 | Status: AC

## 2021-04-26 MED FILL — Lidocaine HCl Local Preservative Free (PF) Inj 1%: INTRAMUSCULAR | Qty: 30 | Status: AC

## 2021-04-26 NOTE — Progress Notes (Signed)
Progress Note  Patient Name: Karen West Date of Encounter: 04/26/2021  [facility] Cardiologist: Minus Breeding, MD   Subjective   Had fatigue with walking yesterday.  Brief run of SVT after walking with cardiac rehab yesterday.  Did better walking today with cardiac rehab.  Oxygen saturation stayed above 90%.  She just feels fatigued.  Breathing has improved with diuresis.  She is now able to lie flat without any oxygen.  Inpatient Medications    Scheduled Meds:  aspirin  81 mg Oral Daily   carvedilol  9.375 mg Oral BID WC   Chlorhexidine Gluconate Cloth  6 each Topical Daily   enoxaparin (LOVENOX) injection  30 mg Subcutaneous Q24H   feeding supplement (GLUCERNA SHAKE)  237 mL Oral TID BM   ferrous sulfate  325 mg Oral BID WC   hydrALAZINE  10 mg Oral Q8H   insulin aspart  0-15 Units Subcutaneous TID WC   isosorbide mononitrate  30 mg Oral Daily   levothyroxine  88 mcg Oral Q0600   pantoprazole  40 mg Oral Daily   sodium chloride flush  3 mL Intravenous Q12H   ticagrelor  90 mg Oral BID   Continuous Infusions:  sodium chloride 10 mL/hr at 04/23/21 1021   sodium chloride     PRN Meds: sodium chloride, acetaminophen, diazepam, labetalol, nitroGLYCERIN, sodium chloride flush   Vital Signs    Vitals:   04/26/21 0300 04/26/21 0400 04/26/21 0500 04/26/21 0600  BP: 108/60 114/61 115/63 113/60  Pulse:  85    Resp: 15 (!) 25 19 16   Temp:  98.8 F (37.1 C)    TempSrc:  Oral    SpO2: 99% 97% 97% 97%  Weight:    67.3 kg  Height:        Intake/Output Summary (Last 24 hours) at 04/26/2021 0811 Last data filed at 04/26/2021 9735 Gross per 24 hour  Intake --  Output 700 ml  Net -700 ml   Last 3 Weights 04/26/2021 04/25/2021 04/24/2021  Weight (lbs) 148 lb 5.9 oz 143 lb 11.8 oz 152 lb 12.5 oz  Weight (kg) 67.3 kg 65.2 kg 69.3 kg      Telemetry    Normal sinus rhythm- Personally Reviewed  ECG      Physical Exam   GEN: No acute distress.   Neck: No  JVD Cardiac: RRR, no murmurs, rubs, or gallops.  Respiratory: Clear to auscultation bilaterally. GI: Soft, nontender, non-distended  MS: No edema; No deformity. Neuro:  Nonfocal  Psych: Normal affect   Labs    High Sensitivity Troponin:   Recent Labs  Lab 04/23/21 1000 04/23/21 1222  TROPONINIHS 22,485* >24,000*     Chemistry Recent Labs  Lab 04/23/21 1000 04/23/21 1042 04/24/21 0625 04/24/21 1455 04/25/21 0252 04/26/21 0122  NA 134*   < > 140  --  135 135  K 4.1   < > 3.5 4.2 3.6 4.1  CL 105   < > 108  --  105 103  CO2 20*  --  16*  --  17* 17*  GLUCOSE 180*   < > 164*  --  170* 131*  BUN 41*   < > 37*  --  44* 61*  CREATININE 1.49*   < > 1.60*  --  1.78* 2.55*  CALCIUM 9.4  --  8.5*  --  8.1* 8.0*  PROT 8.2*  --   --   --   --   --   ALBUMIN 3.8  --   --   --   --   --  AST 63*  --   --   --   --   --   ALT 16  --   --   --   --   --   ALKPHOS 58  --   --   --   --   --   BILITOT 0.5  --   --   --   --   --   GFRNONAA 36*  --  33*  --  29* 19*  ANIONGAP 9  --  16*  --  13 15   < > = values in this interval not displayed.    Lipids  Recent Labs  Lab 04/23/21 1000  CHOL 189  TRIG 90  HDL 57  LDLCALC 114*  CHOLHDL 3.3    Hematology Recent Labs  Lab 04/24/21 0625 04/25/21 0252 04/26/21 0122  WBC 10.2 12.5* 11.2*  RBC 3.73* 3.41* 3.29*  HGB 10.1* 9.1* 8.9*  HCT 30.4* 28.0* 27.0*  MCV 81.5 82.1 82.1  MCH 27.1 26.7 27.1  MCHC 33.2 32.5 33.0  RDW 15.4 15.5 15.5  PLT 353 314 264   Thyroid  Recent Labs  Lab 04/24/21 0625  TSH 0.570    BNPNo results for input(s): BNP, PROBNP in the last 168 hours.  DDimer No results for input(s): DDIMER in the last 168 hours.   Radiology    ECHOCARDIOGRAM COMPLETE  Result Date: 04/24/2021    ECHOCARDIOGRAM REPORT   Patient Name:   Karen West Date of Exam: 04/24/2021 Medical Rec #:  [id number]        Height:       61.0 in Accession #:    [id number]       Weight:       152.8 lb Date of Birth:  1941-03-06          BSA:          1.684 m Patient Age:    81 years         BP:           146/80 mmHg Patient Gender: F                HR:           89 bpm. Exam Location:  Inpatient Procedure: 2D Echo, Cardiac Doppler, Color Doppler and 3D Echo Indications:    Acute myocardial infarction  History:        Patient has prior history of Echocardiogram examinations, most                 recent 08/02/2020. Arrythmias:RBBB, Signs/Symptoms:Chest Pain;                 Risk Factors:Hypertension, Dyslipidemia, Diabetes and Sleep                 Apnea. S/P PCI. CKD.  Sonographer:    Clayton Lefort RDCS (AE) Referring Phys: [place]  1. Septal , apical, lateral and posterior lateral hypokinesis . Left ventricular ejection fraction, by estimation, is 30 to 35%. The left ventricle has moderately decreased function. The left ventricle demonstrates regional wall motion abnormalities (see scoring diagram/findings for description). The left ventricular internal cavity size was moderately dilated. There is mild left ventricular hypertrophy. Left ventricular diastolic parameters are indeterminate.  2. Right ventricular systolic function is normal. The right ventricular size is normal. There is moderately elevated pulmonary artery systolic pressure.  3. Left atrial size was mildly dilated.  4. The mitral  valve is abnormal. Moderate to severe mitral valve regurgitation. No evidence of mitral stenosis. Severe mitral annular calcification.  5. Tricuspid valve regurgitation is moderate.  6. The aortic valve is normal in structure. There is moderate calcification of the aortic valve. There is moderate thickening of the aortic valve. Aortic valve regurgitation is mild to moderate. Mild aortic valve stenosis.  7. The inferior vena cava is normal in size with greater than 50% respiratory variability, suggesting right atrial pressure of 3 mmHg. FINDINGS  Left Ventricle: Septal , apical, lateral and posterior lateral hypokinesis. Left ventricular  ejection fraction, by estimation, is 30 to 35%. The left ventricle has moderately decreased function. The left ventricle demonstrates regional wall motion abnormalities. The left ventricular internal cavity size was moderately dilated. There is mild left ventricular hypertrophy. Left ventricular diastolic parameters are indeterminate. Right Ventricle: The right ventricular size is normal. No increase in right ventricular wall thickness. Right ventricular systolic function is normal. There is moderately elevated pulmonary artery systolic pressure. The tricuspid regurgitant velocity is 3.48 m/s, and with an assumed right atrial pressure of 8 mmHg, the estimated right ventricular systolic pressure is 81.8 mmHg. Left Atrium: Left atrial size was mildly dilated. Right Atrium: Right atrial size was normal in size. Pericardium: There is no evidence of pericardial effusion. Mitral Valve: The mitral valve is abnormal. There is severe thickening of the mitral valve leaflet(s). There is severe calcification of the mitral valve leaflet(s). Severe mitral annular calcification. Moderate to severe mitral valve regurgitation. No evidence of mitral valve stenosis. MV peak gradient, 8.0 mmHg. The mean mitral valve gradient is 3.0 mmHg. Tricuspid Valve: The tricuspid valve is normal in structure. Tricuspid valve regurgitation is moderate . No evidence of tricuspid stenosis. Aortic Valve: The aortic valve is normal in structure. There is moderate calcification of the aortic valve. There is moderate thickening of the aortic valve. Aortic valve regurgitation is mild to moderate. Aortic regurgitation PHT measures 345 msec. Mild  aortic stenosis is present. Aortic valve mean gradient measures 4.0 mmHg. Aortic valve peak gradient measures 7.6 mmHg. Aortic valve area, by VTI measures 2.09 cm. Pulmonic Valve: The pulmonic valve was normal in structure. Pulmonic valve regurgitation is not visualized. No evidence of pulmonic stenosis. Aorta:  The aortic root is normal in size and structure. Venous: The inferior vena cava is normal in size with greater than 50% respiratory variability, suggesting right atrial pressure of 3 mmHg. IAS/Shunts: No atrial level shunt detected by color flow Doppler.  LEFT VENTRICLE PLAX 2D LVIDd:         4.30 cm     Diastology LVIDs:         3.70 cm     LV e' medial:    5.44 cm/s LV PW:         1.30 cm     LV E/e' medial:  18.0 LV IVS:        1.20 cm     LV e' lateral:   4.90 cm/s LVOT diam:     2.00 cm     LV E/e' lateral: 20.0 LV SV:         47 LV SV Index:   28 LVOT Area:     3.14 cm                             3D Volume EF: LV Volumes (MOD)           3D EF:  42 % LV vol d, MOD A2C: 67.5 ml LV EDV:       103 ml LV vol d, MOD A4C: 80.9 ml LV ESV:       60 ml LV vol s, MOD A2C: 42.2 ml LV SV:        43 ml LV vol s, MOD A4C: 41.2 ml LV SV MOD A2C:     25.3 ml LV SV MOD A4C:     80.9 ml LV SV MOD BP:      30.9 ml RIGHT VENTRICLE             IVC RV Basal diam:  3.00 cm     IVC diam: 1.50 cm RV S prime:     12.60 cm/s TAPSE (M-mode): 1.6 cm LEFT ATRIUM             Index        RIGHT ATRIUM          Index LA diam:        4.00 cm 2.37 cm/m   RA Area:     8.49 cm LA Vol (A2C):   41.8 ml 24.81 ml/m  RA Volume:   16.20 ml 9.62 ml/m LA Vol (A4C):   68.0 ml 40.37 ml/m LA Biplane Vol: 54.3 ml 32.24 ml/m  AORTIC VALVE AV Area (Vmax):    1.84 cm AV Area (Vmean):   1.94 cm AV Area (VTI):     2.09 cm AV Vmax:           138.00 cm/s AV Vmean:          91.700 cm/s AV VTI:            0.225 m AV Peak Grad:      7.6 mmHg AV Mean Grad:      4.0 mmHg LVOT Vmax:         80.90 cm/s LVOT Vmean:        56.600 cm/s LVOT VTI:          0.150 m LVOT/AV VTI ratio: 0.67 AI PHT:            345 msec  AORTA Ao Root diam: 2.70 cm Ao Asc diam:  3.40 cm MITRAL VALVE                  TRICUSPID VALVE MV Area (PHT): 3.61 cm       TR Peak grad:   48.4 mmHg MV Area VTI:   1.59 cm       TR Vmax:        348.00 cm/s MV Peak grad:  8.0 mmHg MV Mean grad:   3.0 mmHg       SHUNTS MV Vmax:       1.41 m/s       Systemic VTI:  0.15 m MV Vmean:      81.1 cm/s      Systemic Diam: 2.00 cm MV Decel Time: 210 msec MR Peak grad:    113.2 mmHg MR Mean grad:    81.0 mmHg MR Vmax:         532.00 cm/s MR Vmean:        428.0 cm/s MR PISA:         1.57 cm MR PISA Eff ROA: 11 mm MR PISA Radius:  0.50 cm MV E velocity: 97.90 cm/s MV A velocity: 116.00 cm/s MV E/A ratio:  0.84 [name] MD Electronically signed by [name] MD Signature Date/Time: 04/24/2021/11:05:56 AM  Final     Cardiac Studies   Cath films reviewed: Circumflex stent  Patient Profile     81 y.o. female recent inferior MI with severe mitral regurgitation  Assessment & Plan    CAD/MI: Continue dual antiplatelet therapy.  No other revascularization planned.  Allergic to statins.  Will need lipid clinic post discharge for consideration of other therapy.   Acute renal failure: Likely secondary to combination of MI, dye load and need for diuresis.  Creatinine has jumped significantly so we will have to hold diuresis at this point despite volume overload.  Keep I's and O's even.  When creatinine starts to trend down, could really address diuresis.  Respiratory status much improved.  Okay to move to telemetry today.  For questions or updates, please contact [place] Please consult [email or web address] for contact info under        Signed, Larae Grooms, MD  04/26/2021, 8:11 AM

## 2021-04-26 NOTE — Progress Notes (Signed)
CARDIAC REHAB PHASE I   PRE:  Rate/Rhythm: 78 SR    BP: lying 97/51    SaO2: 98 1L, 93-97 RA  MODE:  Ambulation: 100 ft   POST:  Rate/Rhythm: 92 SR    BP: sitting 118/89     SaO2: 96-98 RA  Pt moved out of bed and walked with standby assist and RW. Checked SaO2 at rest and during walk and maintained 96-98 RA. Pt does fatigue and took x2 rest stops for short distance. Return to bed, SaO2 92-94 RA lying flat. Left off O2 and notified RN. [phone]   [place], ACSM 04/26/2021 9:40 AM

## 2021-04-27 ENCOUNTER — Inpatient Hospital Stay (HOSPITAL_COMMUNITY): Payer: Medicare Other

## 2021-04-27 DIAGNOSIS — I2121 ST elevation (STEMI) myocardial infarction involving left circumflex coronary artery: Secondary | ICD-10-CM | POA: Diagnosis not present

## 2021-04-27 DIAGNOSIS — N179 Acute kidney failure, unspecified: Secondary | ICD-10-CM | POA: Diagnosis not present

## 2021-04-27 DIAGNOSIS — I340 Nonrheumatic mitral (valve) insufficiency: Secondary | ICD-10-CM | POA: Diagnosis not present

## 2021-04-27 DIAGNOSIS — R7303 Prediabetes: Secondary | ICD-10-CM | POA: Diagnosis not present

## 2021-04-27 LAB — BRAIN NATRIURETIC PEPTIDE: B Natriuretic Peptide: 1077.8 pg/mL — ABNORMAL HIGH (ref 0.0–100.0)

## 2021-04-27 LAB — GLUCOSE, CAPILLARY
Glucose-Capillary: 149 mg/dL — ABNORMAL HIGH (ref 70–99)
Glucose-Capillary: 186 mg/dL — ABNORMAL HIGH (ref 70–99)
Glucose-Capillary: 187 mg/dL — ABNORMAL HIGH (ref 70–99)
Glucose-Capillary: 280 mg/dL — ABNORMAL HIGH (ref 70–99)

## 2021-04-27 LAB — BASIC METABOLIC PANEL
Anion gap: 14 (ref 5–15)
BUN: 71 mg/dL — ABNORMAL HIGH (ref 8–23)
CO2: 18 mmol/L — ABNORMAL LOW (ref 22–32)
Calcium: 7.8 mg/dL — ABNORMAL LOW (ref 8.9–10.3)
Chloride: 100 mmol/L (ref 98–111)
Creatinine, Ser: 2.59 mg/dL — ABNORMAL HIGH (ref 0.44–1.00)
GFR, Estimated: 18 mL/min — ABNORMAL LOW (ref >60)
Glucose, Bld: 164 mg/dL — ABNORMAL HIGH (ref 70–99)
Potassium: 3.9 mmol/L (ref 3.5–5.1)
Sodium: 132 mmol/L — ABNORMAL LOW (ref 135–145)

## 2021-04-27 LAB — CBC
HCT: 27.8 % — ABNORMAL LOW (ref 36.0–46.0)
Hemoglobin: 9.3 g/dL — ABNORMAL LOW (ref 12.0–15.0)
MCH: 27.5 pg (ref 26.0–34.0)
MCHC: 33.5 g/dL (ref 30.0–36.0)
MCV: 82.2 fL (ref 80.0–100.0)
Platelets: 224 10*3/uL (ref 150–400)
RBC: 3.38 MIL/uL — ABNORMAL LOW (ref 3.87–5.11)
RDW: 15.7 % — ABNORMAL HIGH (ref 11.5–15.5)
WBC: 9.9 10*3/uL (ref 4.0–10.5)
nRBC: 0.4 % — ABNORMAL HIGH (ref 0.0–0.2)

## 2021-04-27 MED ORDER — CARVEDILOL 6.25 MG PO TABS
6.2500 mg | ORAL_TABLET | Freq: Two times a day (BID) | ORAL | Status: DC
  Administered 2021-04-27: 6.25 mg via ORAL
  Filled 2021-04-27: qty 1

## 2021-04-27 NOTE — Progress Notes (Signed)
Attempted to wean pt back down to room air. Pt stable for about 2 hours, dyspnea increased, O2 86%. Placed pt back on [facility].  Raelyn Number, RN

## 2021-04-27 NOTE — Progress Notes (Addendum)
Progress Note  Patient Name: Karen West Date of Encounter: 04/27/2021  [facility] Cardiologist: Minus Breeding, MD   Subjective   Had episode of chest pain with dyspnea and hypoxia this morning. Chest pain resolved. Remains on O2, still feels short of breath.   Inpatient Medications    Scheduled Meds:  aspirin  81 mg Oral Daily   carvedilol  9.375 mg Oral BID WC   Chlorhexidine Gluconate Cloth  6 each Topical Daily   enoxaparin (LOVENOX) injection  30 mg Subcutaneous Q24H   feeding supplement (GLUCERNA SHAKE)  237 mL Oral TID BM   ferrous sulfate  325 mg Oral BID WC   hydrALAZINE  10 mg Oral Q8H   insulin aspart  0-15 Units Subcutaneous TID WC   isosorbide mononitrate  30 mg Oral Daily   levothyroxine  88 mcg Oral Q0600   pantoprazole  40 mg Oral Daily   sodium chloride flush  3 mL Intravenous Q12H   ticagrelor  90 mg Oral BID   Continuous Infusions:  sodium chloride 10 mL/hr at 04/23/21 1021   sodium chloride     PRN Meds: sodium chloride, acetaminophen, diazepam, labetalol, nitroGLYCERIN, sodium chloride flush   Vital Signs    Vitals:   04/27/21 0600 04/27/21 0628 04/27/21 0700 04/27/21 0802  BP: (!) 82/53 (!) 102/55 (!) 102/57 114/64  Pulse: 72 87 75 80  Resp: (!) _0 Temp: 98 F (36.7 C)   97.9 F (36.6 C)  TempSrc: Oral   Oral  SpO2: 97% 95% 94% 97%  Weight:      Height:        Intake/Output Summary (Last 24 hours) at 04/27/2021 0921 Last data filed at 04/26/2021 2203 Gross per 24 hour  Intake 370 ml  Output 325 ml  Net 45 ml   Last 3 Weights 04/26/2021 04/25/2021 04/24/2021  Weight (lbs) 148 lb 5.9 oz 143 lb 11.8 oz 152 lb 12.5 oz  Weight (kg) 67.3 kg 65.2 kg 69.3 kg      Telemetry    SR, short episode of SVT (rates 150-160s) this morning - Personally Reviewed  ECG    SR, RBBB with continues ST elevation in lead v5-v6 with ST depression v2-v4 - Personally Reviewed  Physical Exam   GEN: Slightly dyspneic with conversation, NC2  _1  Neck: No JVD Cardiac: RRR, + systolic murmur LLSB, rubs, or gallops.  Respiratory: Diminished in bases, faint crackles  GI: Soft, nontender, non-distended  MS: No edema; No deformity. Neuro:  Nonfocal  Psych: Normal affect   Labs    High Sensitivity Troponin:   Recent Labs  Lab 04/23/21 1000 04/23/21 1222  TROPONINIHS 22,485* >24,000*     Chemistry Recent Labs  Lab 04/23/21 1000 04/23/21 1042 04/25/21 0252 04/26/21 0122 04/27/21 0349  NA 134*   < > 135 135 132*  K 4.1   < > 3.6 4.1 3.9  CL 105   < > 105 103 100  CO2 20*   < > 17* 17* 18*  GLUCOSE 180*   < > 170* 131* 164*  BUN 41*   < > 44* 61* 71*  CREATININE 1.49*   < > 1.78* 2.55* 2.59*  CALCIUM 9.4   < > 8.1* 8.0* 7.8*  PROT 8.2*  --   --   --   --   ALBUMIN 3.8  --   --   --   --   AST 63*  --   --   --   --  ALT 16  --   --   --   --   ALKPHOS 58  --   --   --   --   BILITOT 0.5  --   --   --   --   GFRNONAA 36*   < > 29* 19* 18*  ANIONGAP 9   < > _0 < > = values in this interval not displayed.    Lipids  Recent Labs  Lab 04/23/21 1000  CHOL 189  TRIG 90  HDL 57  LDLCALC 114*  CHOLHDL 3.3    Hematology Recent Labs  Lab 04/24/21 0625 04/25/21 0252 04/26/21 0122  WBC 10.2 12.5* 11.2*  RBC 3.73* 3.41* 3.29*  HGB 10.1* 9.1* 8.9*  HCT 30.4* 28.0* 27.0*  MCV 81.5 82.1 82.1  MCH 27.1 26.7 27.1  MCHC 33.2 32.5 33.0  RDW 15.4 15.5 15.5  PLT 353 314 264   Thyroid  Recent Labs  Lab 04/24/21 0625  TSH 0.570    BNPNo results for input(s): BNP, PROBNP in the last 168 hours.  DDimer No results for input(s): DDIMER in the last 168 hours.   Radiology    No results found.  Cardiac Studies   Cath: 04/23/21    Prox LAD to Mid LAD lesion is 70% stenosed.   Prox LAD lesion is 50% stenosed.   Mid Cx lesion is 100% stenosed.   Dist Cx lesion is 80% stenosed.   3rd Mrg lesion is 100% stenosed.   Non-stenotic Dist RCA lesion was previously treated.   Non-stenotic Mid RCA to Dist RCA  lesion was previously treated.   A drug-eluting stent was successfully placed.   Post intervention, there is a 0% residual stenosis.   Post intervention, there is a 0% residual stenosis.   There is moderate left ventricular systolic dysfunction.   Acute inferolateral wall ST segment elevation secondary to total occlusion of a large left circumflex coronary artery with TIMI 0 flow.   Calcification involving the proximal LAD with 50 and 70% stenoses.   Widely patent dominant RCA with previously placed 4 tandem stents proximally to distally without restenosis.   Moderate acute LV dysfunction with EF estimated at 35 to 40% with hypocontractility involving the mid distal anterolateral wall.  LVEDP 30 mm   Successful percutaneous coronary intervention to the left circumflex coronary artery with ultimate insertion of a 2.75 x 26 mm Medtronic Onyx frontiers stent postdilated to 3.04 mm with the 100% occlusion being reduced to 0% and brisk TIMI-3 flow in the major vessel.  There is residual thrombus and a small marginal branch arising from the stented segment which was totally occluded initially.  There also was faint thrombus in a very distal branch of the circumflex.  The plan is to continue Aggrastat for 18 hours post procedure.   RECOMMENDATION: DAPT indefinitely.  We will continue Aggrastat 18 hours postinfusion.  Optimize blood pressure.  Plan 2D echo Doppler study.  Continue carvedilol, consider ARB and transition to [facility] if LV function remains impaired.  Continue Praluent/Zetia with statin intolerance and probable familial hyperlipidemia.   Diagnostic Dominance: Right Intervention    Echo: 04/24/21  IMPRESSIONS     1. Septal , apical, lateral and posterior lateral hypokinesis . Left  ventricular ejection fraction, by estimation, is 30 to 35%. The left  ventricle has moderately decreased function. The left ventricle  demonstrates regional wall motion abnormalities  (see scoring  diagram/findings for description). The left ventricular  internal  cavity size was moderately dilated. There is mild left  ventricular hypertrophy. Left ventricular diastolic parameters are  indeterminate.   2. Right ventricular systolic function is normal. The right ventricular  size is normal. There is moderately elevated pulmonary artery systolic  pressure.   3. Left atrial size was mildly dilated.   4. The mitral valve is abnormal. Moderate to severe mitral valve  regurgitation. No evidence of mitral stenosis. Severe mitral annular  calcification.   5. Tricuspid valve regurgitation is moderate.   6. The aortic valve is normal in structure. There is moderate  calcification of the aortic valve. There is moderate thickening of the  aortic valve. Aortic valve regurgitation is mild to moderate. Mild aortic  valve stenosis.   7. The inferior vena cava is normal in size with greater than 50%  respiratory variability, suggesting right atrial pressure of 3 mmHg.   FINDINGS   Left Ventricle: Septal , apical, lateral and posterior lateral  hypokinesis. Left ventricular ejection fraction, by estimation, is 30 to  35%. The left ventricle has moderately decreased function. The left  ventricle demonstrates regional wall motion  abnormalities. The left ventricular internal cavity size was moderately  dilated. There is mild left ventricular hypertrophy. Left ventricular  diastolic parameters are indeterminate.   Right Ventricle: The right ventricular size is normal. No increase in  right ventricular wall thickness. Right ventricular systolic function is  normal. There is moderately elevated pulmonary artery systolic pressure.  The tricuspid regurgitant velocity is  3.48 m/s, and with an assumed right atrial pressure of 8 mmHg, the  estimated right ventricular systolic pressure is 72.5 mmHg.   Left Atrium: Left atrial size was mildly dilated.   Right Atrium: Right atrial size was normal in  size.   Pericardium: There is no evidence of pericardial effusion.   Mitral Valve: The mitral valve is abnormal. There is severe thickening of  the mitral valve leaflet(s). There is severe calcification of the mitral  valve leaflet(s). Severe mitral annular calcification. Moderate to severe  mitral valve regurgitation. No  evidence of mitral valve stenosis. MV peak gradient, 8.0 mmHg. The mean  mitral valve gradient is 3.0 mmHg.   Tricuspid Valve: The tricuspid valve is normal in structure. Tricuspid  valve regurgitation is moderate . No evidence of tricuspid stenosis.   Aortic Valve: The aortic valve is normal in structure. There is moderate  calcification of the aortic valve. There is moderate thickening of the  aortic valve. Aortic valve regurgitation is mild to moderate. Aortic  regurgitation PHT measures 345 msec. Mild   aortic stenosis is present. Aortic valve mean gradient measures 4.0 mmHg.  Aortic valve peak gradient measures 7.6 mmHg. Aortic valve area, by VTI  measures 2.09 cm.   Pulmonic Valve: The pulmonic valve was normal in structure. Pulmonic valve  regurgitation is not visualized. No evidence of pulmonic stenosis.   Aorta: The aortic root is normal in size and structure.   Venous: The inferior vena cava is normal in size with greater than 50%  respiratory variability, suggesting right atrial pressure of 3 mmHg.   IAS/Shunts: No atrial level shunt detected by color flow Doppler.   Patient Profile     81 y.o. female  with PMH of CAD s/p DES to RCA in 2021, severe MAC, HTN, HLD, multinodular thyroid s/p thyroidectomy in 2019, anemia, GERD, known RBBB, DM2, OSA, carotid artery disease with 50-69% stenosis bilaterally, breast cancer, and lung cancer s/p LVATS 2019, negative myoview stress test in  2013 who was seen 04/23/2021 for the evaluation of STEMI.  Assessment & Plan    STEMI: underwent cardiac cath noted above with successful PCI/DES x1  of mLcx. Did have  residual thrombus in small marginal branch from stented segment. Also with 50-70% pLAD stenosis to be treated medically. Treated with aggrastat for 18 hr post cath. hsTn >24000. Plan for DAPT with ASA/Brilnta for at least one year. Working with CR -- on ASA, Brilinta, statin, BB, Imdur   HFrEF/ICM: LVEF of 30-35%, with septal, apical, lateral and posterior lateral hypokinesis. Episode of dyspnea this morning, remains on [place] @ 3L. Suspect she may need some lasix, but Cr remains elevated. Will discuss plan with MD -- check CXR, BNP -- GDMT limited in the setting of AKI and low blood pressures -- continue BB for now  Acute kidney injury: baseline Cr 1.3, up to 2.59 post cath with diuresis.  -- follow BMET  Moderate to severe MR: MV peak gradient, 8.0 mmHg. The mean  mitral valve gradient is 3.0 mmHg.   HLD: LDL 114? -- on praluent PTA reports compliance -- recheck lipids in am  HTN: blood pressures have been borderline soft -- reduce coreg 6.13m BID, stop hydralazine for now  DM: Hgb A1c 6.1 -- on Trijardy PTA (empag-linaglip-metfor) -- SSI while inpatient   For questions or updates, please contact [place] consult [email or web address] for contact info under        Signed, LReino Bellis NP  04/27/2021, 9:21 AM    I have examined the patient and reviewed assessment and plan and discussed with patient.  Agree with above as stated.    Had chest pain last night.  Known thrombus in the OM.  May have had a transient flow disturbance.  Relieve with SL NTG.  Some lateral ST changes noted.   Cr leveling off.  Once it is betetr, can resune diuresis.  Will need further eval of mitral regurgitation when more stable, with TEE and right heart cath.   JLarae Grooms

## 2021-04-27 NOTE — Care Management Important Message (Signed)
Important Message  Patient Details  Name: Karen West MRN: [id number] Date of Birth: Aug 08, 1940   Medicare Important Message Given:  Yes     [name] 04/27/2021, 8:07 AM

## 2021-04-27 NOTE — Progress Notes (Signed)
CARDIAC REHAB PHASE I   PRE:  Rate/Rhythm: 79 SR    BP: sitting 101/58    SaO2: 100 1 1/2L  MODE:  Ambulation: 90 ft   POST:  Rate/Rhythm: 95 SR    BP: sitting 103/56     SaO2: 95 1L  Pt SOB in bed, gasping noted. Sat up and no longer gasping. Ambulated to BR on 1L then in hall with RW and 1L. C/o fatigue, some SOB. Rest x2. To recliner. SOB better being up than it was in bed. Left on 1 1/2L.  [place], ACSM 04/27/2021 12:21 PM

## 2021-04-27 NOTE — Progress Notes (Addendum)
Pt transferred from [facility] to 4E26 at 9:40 pm. Her SPO2 95-98% on room air. BP soft per Elmyra Ricks, [facility] RN reported. Hydralazine has held since am.   At 12:55 am, Pt suddenly woke up  with acute left mid chest pain score 8/10, anxious and diaphoresis. SPO2 84% on room air, BP 116/85 mmHg, HR 73-94.  O2 NCL 3-5 LPM given. Auscultated left and right basilar breath sound was diminished, no crackles, no wheezing. Heart sound--> positive for loud murmur grade 4, no gallops.   Stat EKG:  27-Apr-2021 01:01:49 am, -->Critical Test Result: STEMI Sinus rhythm with Premature supraventricular complexes Right bundle branch block Lateral infarct , possibly acute Inferior injury pattern ** ** ACUTE MI / STEMI ** ** Abnormal ECG When compared with ECG of 25-Apr-2021 06:48, PREVIOUS ECG IS PRESENT  Nitroglycerine sublingual 0.4 mg x 3 doses given. The chest pain was relieved and she was able to sleep after NTG. Her vital signs stable.   Shanon Brow, RRT and Dr. Conley Canal, on-call cardiologist was notified. No new order from MD. We will continue to monitor.  Malcolm Metro, RN

## 2021-04-28 DIAGNOSIS — I2121 ST elevation (STEMI) myocardial infarction involving left circumflex coronary artery: Secondary | ICD-10-CM | POA: Diagnosis not present

## 2021-04-28 DIAGNOSIS — I340 Nonrheumatic mitral (valve) insufficiency: Secondary | ICD-10-CM | POA: Diagnosis not present

## 2021-04-28 DIAGNOSIS — N179 Acute kidney failure, unspecified: Secondary | ICD-10-CM | POA: Diagnosis not present

## 2021-04-28 DIAGNOSIS — R7303 Prediabetes: Secondary | ICD-10-CM | POA: Diagnosis not present

## 2021-04-28 LAB — GLUCOSE, CAPILLARY
Glucose-Capillary: 163 mg/dL — ABNORMAL HIGH (ref 70–99)
Glucose-Capillary: 283 mg/dL — ABNORMAL HIGH (ref 70–99)
Glucose-Capillary: 297 mg/dL — ABNORMAL HIGH (ref 70–99)
Glucose-Capillary: 63 mg/dL — ABNORMAL LOW (ref 70–99)
Glucose-Capillary: 162 mg/dL — ABNORMAL HIGH (ref 70–99)

## 2021-04-28 LAB — CBC
HCT: 25 % — ABNORMAL LOW (ref 36.0–46.0)
Hemoglobin: 8 g/dL — ABNORMAL LOW (ref 12.0–15.0)
MCH: 26.8 pg (ref 26.0–34.0)
MCHC: 32 g/dL (ref 30.0–36.0)
MCV: 83.9 fL (ref 80.0–100.0)
Platelets: 257 10*3/uL (ref 150–400)
RBC: 2.98 MIL/uL — ABNORMAL LOW (ref 3.87–5.11)
RDW: 15.4 % (ref 11.5–15.5)
WBC: 7.9 10*3/uL (ref 4.0–10.5)
nRBC: 0.3 % — ABNORMAL HIGH (ref 0.0–0.2)

## 2021-04-28 LAB — BASIC METABOLIC PANEL
Anion gap: 16 — ABNORMAL HIGH (ref 5–15)
BUN: 79 mg/dL — ABNORMAL HIGH (ref 8–23)
CO2: 16 mmol/L — ABNORMAL LOW (ref 22–32)
Calcium: 7.6 mg/dL — ABNORMAL LOW (ref 8.9–10.3)
Chloride: 97 mmol/L — ABNORMAL LOW (ref 98–111)
Creatinine, Ser: 2.68 mg/dL — ABNORMAL HIGH (ref 0.44–1.00)
GFR, Estimated: 18 mL/min — ABNORMAL LOW (ref >60)
Glucose, Bld: 217 mg/dL — ABNORMAL HIGH (ref 70–99)
Potassium: 4.1 mmol/L (ref 3.5–5.1)
Sodium: 129 mmol/L — ABNORMAL LOW (ref 135–145)

## 2021-04-28 LAB — LIPID PANEL
Cholesterol: 123 mg/dL (ref 0–200)
HDL: 45 mg/dL (ref >40)
LDL Cholesterol: 58 mg/dL (ref 0–99)
Total CHOL/HDL Ratio: 2.7 RATIO
Triglycerides: 99 mg/dL (ref <150)
VLDL: 20 mg/dL (ref 0–40)

## 2021-04-28 MED ORDER — FUROSEMIDE 10 MG/ML IJ SOLN
80.0000 mg | Freq: Once | INTRAMUSCULAR | Status: AC
  Administered 2021-04-28: 80 mg via INTRAVENOUS
  Filled 2021-04-28: qty 8

## 2021-04-28 MED ORDER — FUROSEMIDE 10 MG/ML IJ SOLN
40.0000 mg | Freq: Every day | INTRAMUSCULAR | Status: DC
  Administered 2021-04-28 – 2021-05-01 (×4): 40 mg via INTRAVENOUS
  Filled 2021-04-28 (×4): qty 4

## 2021-04-28 MED ORDER — CARVEDILOL 3.125 MG PO TABS
3.1250 mg | ORAL_TABLET | Freq: Two times a day (BID) | ORAL | Status: DC
  Administered 2021-04-28 – 2021-05-03 (×10): 3.125 mg via ORAL
  Filled 2021-04-28 (×10): qty 1

## 2021-04-28 NOTE — Progress Notes (Signed)
° °  Went by to check patient this evening to reassess her breathing status and response to Lasix. Upon entering the room, patient sitting on the edge of bed in no acute distress. She states she thinks her breathing is a little better from earlier today but she states she has not had much urine output. Only 300cc of documented urine output. Spoke with the RN who did state some of urine output has not been captured. Breathing becomes labored with talking and she easily becomes tachypneic. On exam, she has decreased breath sounds in the right base and mild crackles in left base. Diuresis has been limited by renal function - creatinine 2.68 today. Discussed with Dr. Johney Frame who also went to see patient - we will give another dose of IV Lasix 80mg  tonight and can reassess in the morning.    Darreld Mclean, PA-C 04/28/2021 8:04 PM

## 2021-04-28 NOTE — Progress Notes (Signed)
Checked with pt x2 today after receiving lasix. Flat affect on EOB, trying to eat. Sts she feels very tired. Declined ambulation. On RA, SaO2 93-98. Will allow rest. Pt declined recliner for now. [place], ACSM 3:19 PM 04/28/2021

## 2021-04-28 NOTE — Progress Notes (Addendum)
Progress Note  Patient Name: Karen West Date of Encounter: 04/28/2021  [facility] Cardiologist: Minus Breeding, MD   Subjective   Sitting up bed, orthopnea overnight. Remains on O2. No chest pain, breathing is worse today.   Inpatient Medications    Scheduled Meds:  aspirin  81 mg Oral Daily   carvedilol  6.25 mg Oral BID WC   Chlorhexidine Gluconate Cloth  6 each Topical Daily   enoxaparin (LOVENOX) injection  30 mg Subcutaneous Q24H   feeding supplement (GLUCERNA SHAKE)  237 mL Oral TID BM   ferrous sulfate  325 mg Oral BID WC   insulin aspart  0-15 Units Subcutaneous TID WC   isosorbide mononitrate  30 mg Oral Daily   levothyroxine  88 mcg Oral Q0600   pantoprazole  40 mg Oral Daily   sodium chloride flush  3 mL Intravenous Q12H   ticagrelor  90 mg Oral BID   Continuous Infusions:  sodium chloride 10 mL/hr at 04/23/21 1021   sodium chloride     PRN Meds: sodium chloride, acetaminophen, diazepam, labetalol, nitroGLYCERIN, sodium chloride flush   Vital Signs    Vitals:   04/27/21 1742 04/27/21 1934 04/27/21 2335 04/28/21 0822  BP:  (!) 103/59 116/69 124/70  Pulse:  80 82 91  Resp:  (!) _0 Temp:  98.3 F (36.8 C) 98.3 F (36.8 C) 97.7 F (36.5 C)  TempSrc:  Oral Oral Oral  SpO2: (!) 86% 95% 95% 91%  Weight:      Height:       No intake or output data in the 24 hours ending 04/28/21 0857 Last 3 Weights 04/26/2021 04/25/2021 04/24/2021  Weight (lbs) 148 lb 5.9 oz 143 lb 11.8 oz 152 lb 12.5 oz  Weight (kg) 67.3 kg 65.2 kg 69.3 kg      Telemetry    SR - Personally Reviewed  ECG    No new tracing this morning  Physical Exam   GEN: Sitting up in bed, [place] _1   Neck: + JVD Cardiac: RRR, + systolic murmur LLUB, no rubs, or gallops.  Respiratory: Diminished bilaterally, crackles GI: Soft, nontender, non-distended  MS: No edema; No deformity. Neuro:  Nonfocal  Psych: Normal affect   Labs    High Sensitivity Troponin:   Recent Labs  Lab  04/23/21 1000 04/23/21 1222  TROPONINIHS 22,485* >24,000*     Chemistry Recent Labs  Lab 04/23/21 1000 04/23/21 1042 04/26/21 0122 04/27/21 0349 04/28/21 0204  NA 134*   < > 135 132* 129*  K 4.1   < > 4.1 3.9 4.1  CL 105   < > 103 100 97*  CO2 20*   < > 17* 18* 16*  GLUCOSE 180*   < > 131* 164* 217*  BUN 41*   < > 61* 71* 79*  CREATININE 1.49*   < > 2.55* 2.59* 2.68*  CALCIUM 9.4   < > 8.0* 7.8* 7.6*  PROT 8.2*  --   --   --   --   ALBUMIN 3.8  --   --   --   --   AST 63*  --   --   --   --   ALT 16  --   --   --   --   ALKPHOS 58  --   --   --   --   BILITOT 0.5  --   --   --   --   GFRNONAA 36*   < >  19* 18* 18*  ANIONGAP 9   < > 15 14 16*   < > = values in this interval not displayed.    Lipids  Recent Labs  Lab 04/28/21 0204  CHOL 123  TRIG 99  HDL 45  LDLCALC 58  CHOLHDL 2.7    Hematology Recent Labs  Lab 04/26/21 0122 04/27/21 1038 04/28/21 0204  WBC 11.2* 9.9 7.9  RBC 3.29* 3.38* 2.98*  HGB 8.9* 9.3* 8.0*  HCT 27.0* 27.8* 25.0*  MCV 82.1 82.2 83.9  MCH 27.1 27.5 26.8  MCHC 33.0 33.5 32.0  RDW 15.5 15.7* 15.4  PLT 264 224 257   Thyroid  Recent Labs  Lab 04/24/21 0625  TSH 0.570    BNP Recent Labs  Lab 04/27/21 0349  BNP 1,077.8*    DDimer No results for input(s): DDIMER in the last 168 hours.   Radiology    DG Chest 2 View  Result Date: 04/27/2021 CLINICAL DATA:  STEMI. EXAM: CHEST - 2 VIEW COMPARISON:  April 23, 2021. FINDINGS: Stable cardiomegaly. Hypoinflation of the lungs is noted. Ill-defined right upper lobe opacity is noted suggesting edema or possibly infiltrate. Left basilar atelectasis and associated pleural effusion is noted. Old left rib fractures are noted. IMPRESSION: Hypoinflation of the lungs. Ill-defined right upper lobe opacity is noted suggesting edema or possibly infiltrate. Left basilar atelectasis and associated pleural effusion is noted. Electronically Signed   By: Marijo Conception M.D.   On: 04/27/2021 12:43     Cardiac Studies   Cath: 04/23/21     Prox LAD to Mid LAD lesion is 70% stenosed.   Prox LAD lesion is 50% stenosed.   Mid Cx lesion is 100% stenosed.   Dist Cx lesion is 80% stenosed.   3rd Mrg lesion is 100% stenosed.   Non-stenotic Dist RCA lesion was previously treated.   Non-stenotic Mid RCA to Dist RCA lesion was previously treated.   A drug-eluting stent was successfully placed.   Post intervention, there is a 0% residual stenosis.   Post intervention, there is a 0% residual stenosis.   There is moderate left ventricular systolic dysfunction.   Acute inferolateral wall ST segment elevation secondary to total occlusion of a large left circumflex coronary artery with TIMI 0 flow.   Calcification involving the proximal LAD with 50 and 70% stenoses.   Widely patent dominant RCA with previously placed 4 tandem stents proximally to distally without restenosis.   Moderate acute LV dysfunction with EF estimated at 35 to 40% with hypocontractility involving the mid distal anterolateral wall.  LVEDP 30 mm   Successful percutaneous coronary intervention to the left circumflex coronary artery with ultimate insertion of a 2.75 x 26 mm Medtronic Onyx frontiers stent postdilated to 3.04 mm with the 100% occlusion being reduced to 0% and brisk TIMI-3 flow in the major vessel.  There is residual thrombus and a small marginal branch arising from the stented segment which was totally occluded initially.  There also was faint thrombus in a very distal branch of the circumflex.  The plan is to continue Aggrastat for 18 hours post procedure.   RECOMMENDATION: DAPT indefinitely.  We will continue Aggrastat 18 hours postinfusion.  Optimize blood pressure.  Plan 2D echo Doppler study.  Continue carvedilol, consider ARB and transition to [facility] if LV function remains impaired.  Continue Praluent/Zetia with statin intolerance and probable familial hyperlipidemia.   Diagnostic Dominance:  Right Intervention     Echo: 04/24/21   IMPRESSIONS  1. Septal , apical, lateral and posterior lateral hypokinesis . Left  ventricular ejection fraction, by estimation, is 30 to 35%. The left  ventricle has moderately decreased function. The left ventricle  demonstrates regional wall motion abnormalities  (see scoring diagram/findings for description). The left ventricular  internal cavity size was moderately dilated. There is mild left  ventricular hypertrophy. Left ventricular diastolic parameters are  indeterminate.   2. Right ventricular systolic function is normal. The right ventricular  size is normal. There is moderately elevated pulmonary artery systolic  pressure.   3. Left atrial size was mildly dilated.   4. The mitral valve is abnormal. Moderate to severe mitral valve  regurgitation. No evidence of mitral stenosis. Severe mitral annular  calcification.   5. Tricuspid valve regurgitation is moderate.   6. The aortic valve is normal in structure. There is moderate  calcification of the aortic valve. There is moderate thickening of the  aortic valve. Aortic valve regurgitation is mild to moderate. Mild aortic  valve stenosis.   7. The inferior vena cava is normal in size with greater than 50%  respiratory variability, suggesting right atrial pressure of 3 mmHg.   FINDINGS   Left Ventricle: Septal , apical, lateral and posterior lateral  hypokinesis. Left ventricular ejection fraction, by estimation, is 30 to  35%. The left ventricle has moderately decreased function. The left  ventricle demonstrates regional wall motion  abnormalities. The left ventricular internal cavity size was moderately  dilated. There is mild left ventricular hypertrophy. Left ventricular  diastolic parameters are indeterminate.   Right Ventricle: The right ventricular size is normal. No increase in  right ventricular wall thickness. Right ventricular systolic function is  normal. There is  moderately elevated pulmonary artery systolic pressure.  The tricuspid regurgitant velocity is  3.48 m/s, and with an assumed right atrial pressure of 8 mmHg, the  estimated right ventricular systolic pressure is 29.7 mmHg.   Left Atrium: Left atrial size was mildly dilated.   Right Atrium: Right atrial size was normal in size.   Pericardium: There is no evidence of pericardial effusion.   Mitral Valve: The mitral valve is abnormal. There is severe thickening of  the mitral valve leaflet(s). There is severe calcification of the mitral  valve leaflet(s). Severe mitral annular calcification. Moderate to severe  mitral valve regurgitation. No  evidence of mitral valve stenosis. MV peak gradient, 8.0 mmHg. The mean  mitral valve gradient is 3.0 mmHg.   Tricuspid Valve: The tricuspid valve is normal in structure. Tricuspid  valve regurgitation is moderate . No evidence of tricuspid stenosis.   Aortic Valve: The aortic valve is normal in structure. There is moderate  calcification of the aortic valve. There is moderate thickening of the  aortic valve. Aortic valve regurgitation is mild to moderate. Aortic  regurgitation PHT measures 345 msec. Mild   aortic stenosis is present. Aortic valve mean gradient measures 4.0 mmHg.  Aortic valve peak gradient measures 7.6 mmHg. Aortic valve area, by VTI  measures 2.09 cm.   Pulmonic Valve: The pulmonic valve was normal in structure. Pulmonic valve  regurgitation is not visualized. No evidence of pulmonic stenosis.   Aorta: The aortic root is normal in size and structure.   Venous: The inferior vena cava is normal in size with greater than 50%  respiratory variability, suggesting right atrial pressure of 3 mmHg.   IAS/Shunts: No atrial level shunt detected by color flow Doppler.   Patient Profile     81  y.o. female   with PMH of CAD s/p DES to RCA in 2021, severe MAC, HTN, HLD, multinodular thyroid s/p thyroidectomy in 2019, anemia, GERD,  known RBBB, DM2, OSA, carotid artery disease with 50-69% stenosis bilaterally, breast cancer, and lung cancer s/p LVATS 2019, negative myoview stress test in 2013 who was seen 04/23/2021 for the evaluation of STEMI.  Assessment & Plan    STEMI: underwent cardiac cath noted above with successful PCI/DES x1  of mLcx. Did have residual thrombus in small marginal branch from stented segment. Also with 50-70% pLAD stenosis to be treated medically. Treated with aggrastat for 18 hr post cath. hsTn >24000. Plan for DAPT with ASA/Brilnta for at least one year. Working with CR -- on ASA, Brilinta, statin, BB, Imdur    HFrEF/ICM: LVEF of 30-35%, with septal, apical, lateral and posterior lateral hypokinesis. Remains dyspneic this morning, requiring O2. CXR with bilateral pulmonary edema. BNP 1077.  -- IV diuresis initially held with worsening renal function, but now significantly volume overload. Reviewed with MD, order IV lasix 43m.  -- GDMT limited in the setting of AKI and low blood pressures -- continue BB for now, but will reduce to 3.1219mBID to allow for diuresis   Acute kidney injury: baseline Cr 1.3, 1.6>>1.78>>2.55>>2.59>>2.68 post cath with diuresis.  -- follow BMET -- no room for ACE/ARB/spiro -- will diurese today, hopefully renal function will improve    Moderate to severe MR: MV peak gradient, 8.0 mmHg. The mean  mitral valve gradient is 3.0 mmHg.    HLD: LDL 58 -- on praluent PTA   HTN: blood pressures have been borderline soft -- reduced coreg 6.2554mID, and stopped hydralazine for now   DM: Hgb A1c 6.1 -- on Trijardy PTA (empag-linaglip-metfor) -- SSI while inpatient  Anemia: Hgb down today at 8.0, from baseline around 10 -- no reports of bleeding -- check hemacult -- follow CBC -- check anemia panel  For questions or updates, please contact [facility] consult [email or web address] for contact info under        Signed, LinReino BellisP  04/28/2021, 8:57 AM     I have examined the patient and reviewed assessment and plan and discussed with patient.  Agree with above as stated.     Decreased breath sounds at the right base.  More short of breath today compared to yesterday.  Despite mild increase in creatinine from yesterday, we will have to give IV diuresis.  I suspect this is multifactorial with LV dysfunction and her mitral regurgitation is also playing a part in the volume overload.  Difficult situation.  Worsening anemia as well.  Check for any GI bleeding.  No further chest pain.   We will speak to daughter by phone later today.  JayLarae Grooms

## 2021-04-29 DIAGNOSIS — N179 Acute kidney failure, unspecified: Secondary | ICD-10-CM | POA: Diagnosis not present

## 2021-04-29 DIAGNOSIS — R7303 Prediabetes: Secondary | ICD-10-CM | POA: Diagnosis not present

## 2021-04-29 DIAGNOSIS — I2121 ST elevation (STEMI) myocardial infarction involving left circumflex coronary artery: Secondary | ICD-10-CM | POA: Diagnosis not present

## 2021-04-29 DIAGNOSIS — I340 Nonrheumatic mitral (valve) insufficiency: Secondary | ICD-10-CM | POA: Diagnosis not present

## 2021-04-29 LAB — BASIC METABOLIC PANEL
Anion gap: 14 (ref 5–15)
BUN: 80 mg/dL — ABNORMAL HIGH (ref 8–23)
CO2: 20 mmol/L — ABNORMAL LOW (ref 22–32)
Calcium: 7.8 mg/dL — ABNORMAL LOW (ref 8.9–10.3)
Chloride: 100 mmol/L (ref 98–111)
Creatinine, Ser: 2.51 mg/dL — ABNORMAL HIGH (ref 0.44–1.00)
GFR, Estimated: 19 mL/min — ABNORMAL LOW (ref >60)
Glucose, Bld: 104 mg/dL — ABNORMAL HIGH (ref 70–99)
Potassium: 3.5 mmol/L (ref 3.5–5.1)
Sodium: 134 mmol/L — ABNORMAL LOW (ref 135–145)

## 2021-04-29 LAB — VITAMIN B12: Vitamin B-12: 2585 pg/mL — ABNORMAL HIGH (ref 180–914)

## 2021-04-29 LAB — CBC
HCT: 24.1 % — ABNORMAL LOW (ref 36.0–46.0)
Hemoglobin: 7.7 g/dL — ABNORMAL LOW (ref 12.0–15.0)
MCH: 26.3 pg (ref 26.0–34.0)
MCHC: 32 g/dL (ref 30.0–36.0)
MCV: 82.3 fL (ref 80.0–100.0)
Platelets: 264 10*3/uL (ref 150–400)
RBC: 2.93 MIL/uL — ABNORMAL LOW (ref 3.87–5.11)
RDW: 15.6 % — ABNORMAL HIGH (ref 11.5–15.5)
WBC: 8 10*3/uL (ref 4.0–10.5)
nRBC: 0.5 % — ABNORMAL HIGH (ref 0.0–0.2)

## 2021-04-29 LAB — RETICULOCYTES
Immature Retic Fract: 32.1 % — ABNORMAL HIGH (ref 2.3–15.9)
RBC.: 2.94 MIL/uL — ABNORMAL LOW (ref 3.87–5.11)
Retic Count, Absolute: 82.3 10*3/uL (ref 19.0–186.0)
Retic Ct Pct: 2.8 % (ref 0.4–3.1)

## 2021-04-29 LAB — GLUCOSE, CAPILLARY
Glucose-Capillary: 133 mg/dL — ABNORMAL HIGH (ref 70–99)
Glucose-Capillary: 261 mg/dL — ABNORMAL HIGH (ref 70–99)
Glucose-Capillary: 274 mg/dL — ABNORMAL HIGH (ref 70–99)
Glucose-Capillary: 242 mg/dL — ABNORMAL HIGH (ref 70–99)

## 2021-04-29 LAB — MAGNESIUM: Magnesium: 2.1 mg/dL (ref 1.7–2.4)

## 2021-04-29 LAB — FOLATE: Folate: 9.9 ng/mL (ref >5.9)

## 2021-04-29 LAB — IRON AND TIBC
Iron: 22 ug/dL — ABNORMAL LOW (ref 28–170)
Saturation Ratios: 7 % — ABNORMAL LOW (ref 10.4–31.8)
TIBC: 312 ug/dL (ref 250–450)
UIBC: 290 ug/dL

## 2021-04-29 LAB — FERRITIN: Ferritin: 46 ng/mL (ref 11–307)

## 2021-04-29 MED ORDER — GLUCERNA SHAKE PO LIQD
237.0000 mL | Freq: Three times a day (TID) | ORAL | Status: DC
  Administered 2021-04-29 – 2021-05-16 (×18): 237 mL via ORAL
  Filled 2021-04-29 (×3): qty 237

## 2021-04-29 MED ORDER — POTASSIUM CHLORIDE CRYS ER 20 MEQ PO TBCR
20.0000 meq | EXTENDED_RELEASE_TABLET | Freq: Once | ORAL | Status: AC
  Administered 2021-04-29: 20 meq via ORAL
  Filled 2021-04-29: qty 1

## 2021-04-29 MED ORDER — SODIUM CHLORIDE 0.9 % IV SOLN
250.0000 mg | Freq: Every day | INTRAVENOUS | Status: AC
  Administered 2021-04-29 – 2021-04-30 (×2): 250 mg via INTRAVENOUS
  Filled 2021-04-29 (×2): qty 20

## 2021-04-29 NOTE — Care Management Important Message (Signed)
Important Message  Patient Details  Name: Karen West MRN: [id number] Date of Birth: Nov 15, 1940   Medicare Important Message Given:  Yes     [name] 04/29/2021, 10:22 AM

## 2021-04-29 NOTE — Progress Notes (Signed)
Hypoglycemic Event  CBG: 63 at 2110  Treatment: 4 oz juice/soda at 2120  Symptoms: None  Follow-up CBG: Time:2148 CBG Result:162  Possible Reasons for Event: Inadequate meal intake  Comments/MD notified: No    Florestine Avers

## 2021-04-29 NOTE — Progress Notes (Signed)
CARDIAC REHAB PHASE I   PRE:  Rate/Rhythm: 85 SR    BP: sitting     SaO2: 98 RA  MODE:  Ambulation: 180 ft   POST:  Rate/Rhythm: 105 ST    BP: sitting to BSC     SaO2: 98 RA  Pt on EOB, ready to walk. Increased SOB and anxiety walking but wanted to increase distance. Rest x2. Tired after walk. To BSC, SOB less after sitting. She does have SOB PTA as well.  [place], ACSM 04/29/2021 3:17 PM

## 2021-04-29 NOTE — Progress Notes (Signed)
Inpatient Diabetes Program Recommendations  AACE/ADA: New Consensus Statement on Inpatient Glycemic Control (2015)  Target Ranges:  Prepandial:   less than 140 mg/dL      Peak postprandial:   less than 180 mg/dL (1-2 hours)      Critically ill patients:  140 - 180 mg/dL   Lab Results  Component Value Date   GLUCAP 261 (H) 04/29/2021   HGBA1C 6.0 (H) 04/25/2021    Review of Glycemic Control  Latest Reference Range & Units 04/28/21 21:10 04/28/21 21:48 04/29/21 06:18 04/29/21 11:46  Glucose-Capillary 70 - 99 mg/dL 63 (L) 162 (H) 133 (H) 261 (H)  (L): Data is abnormally low (H): Data is abnormally high Diabetes history: Type 2 DM Outpatient Diabetes medications: Trijardy 12.5-2.07-998 mg BID Current orders for Inpatient glycemic control: Novolog 0-15 units TID  Inpatient Diabetes Program Recommendations:    Noted hypoglycemia of 63 mg/dL following dose of correction.  Consider decreasing correction to Novolog 0-6 units TID & HS.  Thanks, Bronson Curb, MSN, RNC-OB Diabetes Coordinator [phone] (8a-5p)

## 2021-04-29 NOTE — Plan of Care (Signed)
°  Problem: Clinical Measurements: Goal: Respiratory complications will improve Outcome: Progressing Goal: Cardiovascular complication will be avoided Outcome: Progressing   Problem: Activity: Goal: Risk for activity intolerance will decrease Outcome: Progressing   Problem: Nutrition: Goal: Adequate nutrition will be maintained Outcome: Progressing   Problem: Pain Managment: Goal: General experience of comfort will improve Outcome: Progressing

## 2021-04-29 NOTE — Progress Notes (Addendum)
Progress Note  Patient Name: Karen West Date of Encounter: 04/29/2021  [facility] Cardiologist: Minus Breeding, MD   Subjective   Had increased shortness of breath yesterday.  Additional Lasix was given last night.  Breathing much improved today.  She is able to lie flat without difficulty.  Inpatient Medications    Scheduled Meds:  aspirin  81 mg Oral Daily   carvedilol  3.125 mg Oral BID WC   Chlorhexidine Gluconate Cloth  6 each Topical Daily   enoxaparin (LOVENOX) injection  30 mg Subcutaneous Q24H   feeding supplement (GLUCERNA SHAKE)  237 mL Oral TID BM   ferrous sulfate  325 mg Oral BID WC   furosemide  40 mg Intravenous Daily   insulin aspart  0-15 Units Subcutaneous TID WC   isosorbide mononitrate  30 mg Oral Daily   levothyroxine  88 mcg Oral Q0600   pantoprazole  40 mg Oral Daily   sodium chloride flush  3 mL Intravenous Q12H   ticagrelor  90 mg Oral BID   Continuous Infusions:  sodium chloride 10 mL/hr at 04/23/21 1021   sodium chloride     PRN Meds: sodium chloride, acetaminophen, diazepam, labetalol, nitroGLYCERIN, sodium chloride flush   Vital Signs    Vitals:   04/28/21 1929 04/28/21 2321 04/29/21 0355 04/29/21 0730  BP: 128/63 116/69 118/72 (!) 131/58  Pulse: 98 79 81 80  Resp: 17 19 18 18   Temp: (!) 97.5 F (36.4 C) 98.3 F (36.8 C) 97.6 F (36.4 C) 98.5 F (36.9 C)  TempSrc: Oral Oral Oral Oral  SpO2: 100% 100% 100% 97%  Weight:      Height:        Intake/Output Summary (Last 24 hours) at 04/29/2021 0804 Last data filed at 04/29/2021 0093 Gross per 24 hour  Intake 600 ml  Output 1700 ml  Net -1100 ml   Last 3 Weights 04/26/2021 04/25/2021 04/24/2021  Weight (lbs) 148 lb 5.9 oz 143 lb 11.8 oz 152 lb 12.5 oz  Weight (kg) 67.3 kg 65.2 kg 69.3 kg      Telemetry    Normal sinus rhythm- Personally Reviewed  ECG      Physical Exam   GEN: No acute distress.   Neck: No JVD Cardiac: RRR, no murmurs, rubs, or gallops.   Respiratory: Bibasilar crackles to auscultation bilaterally.  Improved aeration of the right base GI: Soft, nontender, non-distended  MS: No edema; No deformity. Neuro:  Nonfocal  Psych: Normal affect   Labs    High Sensitivity Troponin:   Recent Labs  Lab 04/23/21 1000 04/23/21 1222  TROPONINIHS 22,485* >24,000*     Chemistry Recent Labs  Lab 04/23/21 1000 04/23/21 1042 04/27/21 0349 04/28/21 0204 04/29/21 0146  NA 134*   < > 132* 129* 134*  K 4.1   < > 3.9 4.1 3.5  CL 105   < > 100 97* 100  CO2 20*   < > 18* 16* 20*  GLUCOSE 180*   < > 164* 217* 104*  BUN 41*   < > 71* 79* 80*  CREATININE 1.49*   < > 2.59* 2.68* 2.51*  CALCIUM 9.4   < > 7.8* 7.6* 7.8*  MG  --   --   --   --  2.1  PROT 8.2*  --   --   --   --   ALBUMIN 3.8  --   --   --   --   AST 63*  --   --   --   --  ALT 16  --   --   --   --   ALKPHOS 58  --   --   --   --   BILITOT 0.5  --   --   --   --   GFRNONAA 36*   < > 18* 18* 19*  ANIONGAP 9   < > 14 16* 14   < > = values in this interval not displayed.    Lipids  Recent Labs  Lab 04/28/21 0204  CHOL 123  TRIG 99  HDL 45  LDLCALC 58  CHOLHDL 2.7    Hematology Recent Labs  Lab 04/26/21 0122 04/27/21 1038 04/28/21 0204 04/29/21 0146  WBC 11.2* 9.9 7.9  --   RBC 3.29* 3.38* 2.98* 2.94*  HGB 8.9* 9.3* 8.0*  --   HCT 27.0* 27.8* 25.0*  --   MCV 82.1 82.2 83.9  --   MCH 27.1 27.5 26.8  --   MCHC 33.0 33.5 32.0  --   RDW 15.5 15.7* 15.4  --   PLT 264 224 257  --    Thyroid  Recent Labs  Lab 04/24/21 0625  TSH 0.570    BNP Recent Labs  Lab 04/27/21 0349  BNP 1,077.8*    DDimer No results for input(s): DDIMER in the last 168 hours.   Radiology    DG Chest 2 View  Result Date: 04/27/2021 CLINICAL DATA:  STEMI. EXAM: CHEST - 2 VIEW COMPARISON:  April 23, 2021. FINDINGS: Stable cardiomegaly. Hypoinflation of the lungs is noted. Ill-defined right upper lobe opacity is noted suggesting edema or possibly infiltrate. Left  basilar atelectasis and associated pleural effusion is noted. Old left rib fractures are noted. IMPRESSION: Hypoinflation of the lungs. Ill-defined right upper lobe opacity is noted suggesting edema or possibly infiltrate. Left basilar atelectasis and associated pleural effusion is noted. Electronically Signed   By: Marijo Conception M.D.   On: 04/27/2021 12:43    Cardiac Studies   Cath results reviewed  Patient Profile     81 y.o. female inferolateral STEMI, LV dysfunction, mitral regurgitation  Assessment & Plan    Inferolateral STEMI/CAD: She had DES to the mid circumflex.  She did have some residual thrombus in a small marginal branch.  Moderate LAD disease which was going to be treated medically.  Acute systolic heart failure/mitral regurgitation.  LVEF decreased.  Diuresis has been limited by acute renal failure/chronic kidney disease.  Today, her creatinine is trending towards normal.  I think we can continue with diuresis.  In regards to her mitral regurgitation, I did speak with the [facility] team on Monday.  She will at some point need a TEE.  They were hoping to get her diuresed and likely right heart repeated when euvolemic.  She was not in a respiratory status to undergo TEE yesterday.  We will plan for TEE on Monday, scheduled for 1:30 PM.  Her respiratory status is significantly improved.  Continue diuresis as her renal function will allow.  All questionsabout TEE answered.  PLan discussed with her daughter by phone.   Hyperlipidemia: Continue Praluent.  Due for a dose on Monday.   Hypertension: Reduced beta-blocker yesterday as her heart failure seems to be worsening.  Diabetes: Continue aggressive medical therapy.  Anemia: Hemoccult pending.  Giving iron IV today. Will stop aspirin and continue Brilinta monotherapy at this time.  Type and screen in AM just in case transfusion is needed.   For questions or updates, please contact [place]  Please consult [email or web address] for  contact info under        Signed, Larae Grooms, MD  04/29/2021, 8:04 AM

## 2021-04-30 DIAGNOSIS — I2121 ST elevation (STEMI) myocardial infarction involving left circumflex coronary artery: Secondary | ICD-10-CM | POA: Diagnosis not present

## 2021-04-30 LAB — BASIC METABOLIC PANEL
Anion gap: 16 — ABNORMAL HIGH (ref 5–15)
BUN: 78 mg/dL — ABNORMAL HIGH (ref 8–23)
CO2: 20 mmol/L — ABNORMAL LOW (ref 22–32)
Calcium: 8 mg/dL — ABNORMAL LOW (ref 8.9–10.3)
Chloride: 101 mmol/L (ref 98–111)
Creatinine, Ser: 2.53 mg/dL — ABNORMAL HIGH (ref 0.44–1.00)
GFR, Estimated: 19 mL/min — ABNORMAL LOW (ref >60)
Glucose, Bld: 111 mg/dL — ABNORMAL HIGH (ref 70–99)
Potassium: 3.5 mmol/L (ref 3.5–5.1)
Sodium: 137 mmol/L (ref 135–145)

## 2021-04-30 LAB — GLUCOSE, CAPILLARY
Glucose-Capillary: 141 mg/dL — ABNORMAL HIGH (ref 70–99)
Glucose-Capillary: 219 mg/dL — ABNORMAL HIGH (ref 70–99)
Glucose-Capillary: 301 mg/dL — ABNORMAL HIGH (ref 70–99)
Glucose-Capillary: 139 mg/dL — ABNORMAL HIGH (ref 70–99)

## 2021-04-30 LAB — TYPE AND SCREEN
ABO/RH(D): B POS
Antibody Screen: NEGATIVE

## 2021-04-30 NOTE — Progress Notes (Signed)
CARDIAC REHAB PHASE I   PRE:  Rate/Rhythm: 78 SR  BP:  Supine: 93/47  Sitting: 117/55   Standing:    SaO2: 100% RA  MODE:  Ambulation: 200 ft   POST:  Rate/Rhythm: 97 SR  BP:  Supine:   Sitting: 90/56 recheck 96/61 Standing:    SaO2: 100% RA  1005-1103 Patient tolerated ambulation fair w/ assist x1 and pushing rolling walker. Two standing rest breaks taken. Patient c/o fatigue. Blood pressure low before and after walk, no symptoms other than fatigue. Assisted to bedside commode for bowl movement after walk then to chair with call bell within reach, IV intact.  Sol Passer, MS, ACSM CEP

## 2021-04-30 NOTE — Progress Notes (Signed)
EKG not showing up in Epic. Repeat EKG completed and able to be viewed in results [name], RN

## 2021-04-30 NOTE — Progress Notes (Signed)
Patient reported 5/10 mid chest and teeth pain. EKG done reporting acute STEMI NSR. VSS. One dose of sublingual nitroglycerine administered. Patient's pain is now gone. MD notified. Cecelia Graciano B Iran, RN

## 2021-04-30 NOTE — Progress Notes (Addendum)
Progress Note  Patient Name: Karen West Date of Encounter: 04/30/2021  Primary Cardiologist:   Minus Breeding, MD   Subjective   The patient says that she is breathing OK.  No pain.  Was able to be flat in bed last night.    Inpatient Medications    Scheduled Meds:  carvedilol  3.125 mg Oral BID WC   Chlorhexidine Gluconate Cloth  6 each Topical Daily   enoxaparin (LOVENOX) injection  30 mg Subcutaneous Q24H   feeding supplement (GLUCERNA SHAKE)  237 mL Oral TID BM   ferrous sulfate  325 mg Oral BID WC   furosemide  40 mg Intravenous Daily   insulin aspart  0-15 Units Subcutaneous TID WC   isosorbide mononitrate  30 mg Oral Daily   levothyroxine  88 mcg Oral Q0600   pantoprazole  40 mg Oral Daily   sodium chloride flush  3 mL Intravenous Q12H   ticagrelor  90 mg Oral BID   Continuous Infusions:  sodium chloride 10 mL/hr at 04/23/21 1021   sodium chloride     PRN Meds: sodium chloride, acetaminophen, diazepam, labetalol, nitroGLYCERIN, sodium chloride flush   Vital Signs    Vitals:   04/30/21 0003 04/30/21 0433 04/30/21 0757 04/30/21 0800  BP: 122/60 (!) 111/47 (!) 108/56   Pulse: 82 81 82   Resp: 20 20 19    Temp: 98.5 F (36.9 C) 98.6 F (37 C) 98.9 F (37.2 C)   TempSrc: Oral Oral Oral   SpO2: 90% 99% 99% 99%  Weight:  68.7 kg    Height:        Intake/Output Summary (Last 24 hours) at 04/30/2021 1041 Last data filed at 04/30/2021 0830 Gross per 24 hour  Intake 1380 ml  Output 1000 ml  Net 380 ml   Filed Weights   04/25/21 0530 04/26/21 0600 04/30/21 0433  Weight: 65.2 kg 67.3 kg 68.7 kg    Telemetry    NSR - Personally Reviewed  ECG    NA - Personally Reviewed  Physical Exam   GEN: No acute distress.   Neck: No  JVD Cardiac: RRR, holosystolic murmur at the apex, no diastolic murmurs, rubs, or gallops.  Respiratory: Clear  to auscultation bilaterally. GI: Soft, nontender, non-distended  MS: No  edema; No deformity. Neuro:   Nonfocal  Psych: Normal affect   Labs    Chemistry Recent Labs  Lab 04/28/21 0204 04/29/21 0146 04/30/21 0205  NA 129* 134* 137  K 4.1 3.5 3.5  CL 97* 100 101  CO2 16* 20* 20*  GLUCOSE 217* 104* 111*  BUN 79* 80* 78*  CREATININE 2.68* 2.51* 2.53*  CALCIUM 7.6* 7.8* 8.0*  GFRNONAA 18* 19* 19*  ANIONGAP 16* 14 16*     Hematology Recent Labs  Lab 04/27/21 1038 04/28/21 0204 04/29/21 0146  WBC 9.9 7.9 8.0  RBC 3.38* 2.98* 2.93*   2.94*  HGB 9.3* 8.0* 7.7*  HCT 27.8* 25.0* 24.1*  MCV 82.2 83.9 82.3  MCH 27.5 26.8 26.3  MCHC 33.5 32.0 32.0  RDW 15.7* 15.4 15.6*  PLT 224 257 264    Cardiac EnzymesNo results for input(s): TROPONINI in the last 168 hours. No results for input(s): TROPIPOC in the last 168 hours.   BNP Recent Labs  Lab 04/27/21 0349  BNP 1,077.8*     DDimer No results for input(s): DDIMER in the last 168 hours.   Radiology    No results found.  Cardiac Studies   ECHO:  1. Septal , apical, lateral and posterior lateral hypokinesis . Left  ventricular ejection fraction, by estimation, is 30 to 35%. The left  ventricle has moderately decreased function. The left ventricle  demonstrates regional wall motion abnormalities  (see scoring diagram/findings for description). The left ventricular  internal cavity size was moderately dilated. There is mild left  ventricular hypertrophy. Left ventricular diastolic parameters are  indeterminate.   2. Right ventricular systolic function is normal. The right ventricular  size is normal. There is moderately elevated pulmonary artery systolic  pressure.   3. Left atrial size was mildly dilated.   4. The mitral valve is abnormal. Moderate to severe mitral valve  regurgitation. No evidence of mitral stenosis. Severe mitral annular  calcification.   5. Tricuspid valve regurgitation is moderate.   6. The aortic valve is normal in structure. There is moderate  calcification of the aortic valve. There is  moderate thickening of the  aortic valve. Aortic valve regurgitation is mild to moderate. Mild aortic  valve stenosis.   7. The inferior vena cava is normal in size with greater than 50%  respiratory variability, suggesting right atrial pressure of 3 mmHg.    Cardiac cath:   Diagnostic Dominance: Right Intervention     Patient Profile     81 y.o. female inferolateral STEMI, LV dysfunction, mitral regurgitation.    Assessment & Plan    STEMI:  DES to mid circ.    No further pain.  Continue DAPT.    ACUTE SYSTOLIC HF:   Net negative 2.2 liters.   I will continue the current diuresis.    MR :  Plan is for TEE on Monday.  Discussed with Structural Heart Team.     (Greater than 50 minutes reviewing all data with greater than 50% face to face with the patient).  DYSLIPIDEMIA:  Continue current therapy.   HTN:  BP is soft but I will try again to titrate beta blocker in the AM.    DM:  Continue current therapy.     AKI:  Creat is stable from yesterday.  Follow closely.    ANEMIA:   Stopped ASA.   No obvious bleeding.    For questions or updates, please contact [place] Please consult [email or web address] for contact info under Cardiology/STEMI.   Signed, Minus Breeding, MD  04/30/2021, 10:41 AM

## 2021-04-30 NOTE — Progress Notes (Signed)
Pt reported upper chest and Jaw pain, nitroglycerin given, we'll continue to monitor.

## 2021-05-01 DIAGNOSIS — I2121 ST elevation (STEMI) myocardial infarction involving left circumflex coronary artery: Secondary | ICD-10-CM | POA: Diagnosis not present

## 2021-05-01 LAB — CBC
HCT: 28.2 % — ABNORMAL LOW (ref 36.0–46.0)
Hemoglobin: 9 g/dL — ABNORMAL LOW (ref 12.0–15.0)
MCH: 27.2 pg (ref 26.0–34.0)
MCHC: 31.9 g/dL (ref 30.0–36.0)
MCV: 85.2 fL (ref 80.0–100.0)
Platelets: 342 10*3/uL (ref 150–400)
RBC: 3.31 MIL/uL — ABNORMAL LOW (ref 3.87–5.11)
RDW: 15.9 % — ABNORMAL HIGH (ref 11.5–15.5)
WBC: 8.6 10*3/uL (ref 4.0–10.5)
nRBC: 0.3 % — ABNORMAL HIGH (ref 0.0–0.2)

## 2021-05-01 LAB — GLUCOSE, CAPILLARY
Glucose-Capillary: 129 mg/dL — ABNORMAL HIGH (ref 70–99)
Glucose-Capillary: 257 mg/dL — ABNORMAL HIGH (ref 70–99)
Glucose-Capillary: 284 mg/dL — ABNORMAL HIGH (ref 70–99)
Glucose-Capillary: 249 mg/dL — ABNORMAL HIGH (ref 70–99)

## 2021-05-01 LAB — BASIC METABOLIC PANEL
Anion gap: 17 — ABNORMAL HIGH (ref 5–15)
BUN: 74 mg/dL — ABNORMAL HIGH (ref 8–23)
CO2: 17 mmol/L — ABNORMAL LOW (ref 22–32)
Calcium: 7.7 mg/dL — ABNORMAL LOW (ref 8.9–10.3)
Chloride: 100 mmol/L (ref 98–111)
Creatinine, Ser: 2.51 mg/dL — ABNORMAL HIGH (ref 0.44–1.00)
GFR, Estimated: 19 mL/min — ABNORMAL LOW (ref >60)
Glucose, Bld: 289 mg/dL — ABNORMAL HIGH (ref 70–99)
Potassium: 3.6 mmol/L (ref 3.5–5.1)
Sodium: 134 mmol/L — ABNORMAL LOW (ref 135–145)

## 2021-05-01 MED ORDER — FUROSEMIDE 40 MG PO TABS
40.0000 mg | ORAL_TABLET | Freq: Every day | ORAL | Status: DC
  Administered 2021-05-01: 40 mg via ORAL
  Filled 2021-05-01: qty 1

## 2021-05-01 NOTE — Progress Notes (Signed)
Progress Note  Patient Name: Karen West Date of Encounter: 05/01/2021  Primary Cardiologist:   Minus Breeding, MD   Subjective   She had some jaw pain last night.  However, this was not like her angina.    Inpatient Medications    Scheduled Meds:  carvedilol  3.125 mg Oral BID WC   Chlorhexidine Gluconate Cloth  6 each Topical Daily   enoxaparin (LOVENOX) injection  30 mg Subcutaneous Q24H   feeding supplement (GLUCERNA SHAKE)  237 mL Oral TID BM   ferrous sulfate  325 mg Oral BID WC   furosemide  40 mg Intravenous Daily   insulin aspart  0-15 Units Subcutaneous TID WC   isosorbide mononitrate  30 mg Oral Daily   levothyroxine  88 mcg Oral Q0600   pantoprazole  40 mg Oral Daily   sodium chloride flush  3 mL Intravenous Q12H   ticagrelor  90 mg Oral BID   Continuous Infusions:  sodium chloride 10 mL/hr at 04/23/21 1021   sodium chloride     PRN Meds: sodium chloride, acetaminophen, diazepam, labetalol, nitroGLYCERIN, sodium chloride flush   Vital Signs    Vitals:   04/30/21 1734 04/30/21 2326 05/01/21 0424 05/01/21 0740  BP: (!) 118/54 104/62 108/67 (!) 110/52  Pulse:  92 87 80  Resp:  20 19 19   Temp:  99 F (37.2 C) 97.7 F (36.5 C) 98.4 F (36.9 C)  TempSrc:  Oral Oral Oral  SpO2:  93% 100% 97%  Weight:   67.8 kg   Height:        Intake/Output Summary (Last 24 hours) at 05/01/2021 1103 Last data filed at 05/01/2021 0430 Gross per 24 hour  Intake 720 ml  Output 700 ml  Net 20 ml   Filed Weights   04/26/21 0600 04/30/21 0433 05/01/21 0424  Weight: 67.3 kg 68.7 kg 67.8 kg    Telemetry    NSR - Personally Reviewed  ECG    NA - Personally Reviewed  Physical Exam   GENERAL:  Well appearing NECK:  No jugular venous distention, waveform within normal limits, carotid upstroke brisk and symmetric, no bruits, no thyromegaly LUNGS:  Clear to auscultation bilaterally CHEST:  Unremarkable HEART:  PMI not displaced or sustained,S1 and S2 within  normal limits, no S3, no S4, no clicks, no rubs, 3/6 apical systolic murmur, no diastolic ABD:  Flat, positive bowel sounds normal in frequency in pitch, no bruits, no rebound, no guarding, no midline pulsatile mass, no hepatomegaly, no splenomegaly EXT:  2 plus pulses throughout, no edema, no cyanosis no clubbing    Labs    Chemistry Recent Labs  Lab 04/28/21 0204 04/29/21 0146 04/30/21 0205  NA 129* 134* 137  K 4.1 3.5 3.5  CL 97* 100 101  CO2 16* 20* 20*  GLUCOSE 217* 104* 111*  BUN 79* 80* 78*  CREATININE 2.68* 2.51* 2.53*  CALCIUM 7.6* 7.8* 8.0*  GFRNONAA 18* 19* 19*  ANIONGAP 16* 14 16*     Hematology Recent Labs  Lab 04/27/21 1038 04/28/21 0204 04/29/21 0146  WBC 9.9 7.9 8.0  RBC 3.38* 2.98* 2.93*   2.94*  HGB 9.3* 8.0* 7.7*  HCT 27.8* 25.0* 24.1*  MCV 82.2 83.9 82.3  MCH 27.5 26.8 26.3  MCHC 33.5 32.0 32.0  RDW 15.7* 15.4 15.6*  PLT 224 257 264    Cardiac EnzymesNo results for input(s): TROPONINI in the last 168 hours. No results for input(s): TROPIPOC in the last 168 hours.  BNP Recent Labs  Lab 04/27/21 0349  BNP 1,077.8*     DDimer No results for input(s): DDIMER in the last 168 hours.   Radiology    No results found.  Cardiac Studies   ECHO:  1. Septal , apical, lateral and posterior lateral hypokinesis . Left  ventricular ejection fraction, by estimation, is 30 to 35%. The left  ventricle has moderately decreased function. The left ventricle  demonstrates regional wall motion abnormalities  (see scoring diagram/findings for description). The left ventricular  internal cavity size was moderately dilated. There is mild left  ventricular hypertrophy. Left ventricular diastolic parameters are  indeterminate.   2. Right ventricular systolic function is normal. The right ventricular  size is normal. There is moderately elevated pulmonary artery systolic  pressure.   3. Left atrial size was mildly dilated.   4. The mitral valve is  abnormal. Moderate to severe mitral valve  regurgitation. No evidence of mitral stenosis. Severe mitral annular  calcification.   5. Tricuspid valve regurgitation is moderate.   6. The aortic valve is normal in structure. There is moderate  calcification of the aortic valve. There is moderate thickening of the  aortic valve. Aortic valve regurgitation is mild to moderate. Mild aortic  valve stenosis.   7. The inferior vena cava is normal in size with greater than 50%  respiratory variability, suggesting right atrial pressure of 3 mmHg.    Cardiac cath:   Diagnostic Dominance: Right Intervention     Patient Profile     81 y.o. female inferolateral STEMI, LV dysfunction, mitral regurgitation.     Assessment & Plan    STEMI:  DES to mid circ.   Chest pain this morning.  Continue medical management.    On ticagrelor alone because of anemia.     ACUTE SYSTOLIC HF:   Net negative 2.5 liters.    Scheduled for TEE Monday at 1:30.  Low BPs.  Unable to titrate meds with low BPs.   I will change to PO Lasix.   MR :  Plan is for TEE on Monday.    DYSLIPIDEMIA:    Continue current therapy.   HTN:  BP is low.  No med titration at this time.   DM:     Continue current therapy.     AKI:  Creat is mildly elevated yesterday.  Check BMET and CBC today.      ANEMIA:   Stopped ASA.     No obvious bleeding.  Check CBC.    For questions or updates, please contact [place] Please consult [email or web address] for contact info under Cardiology/STEMI.   Signed, Minus Breeding, MD  05/01/2021, 11:03 AM

## 2021-05-01 NOTE — Progress Notes (Signed)
° °  Pt's Cr same today so will hold lasix for now.  I stopped it will check in AM again.   Cecilie Kicks, FNP-C [facility] [place]  [phone] or after 5pm and on weekends call [phone] 05/01/2021.

## 2021-05-01 NOTE — H&P (View-Only) (Signed)
Progress Note  Patient Name: Karen West Date of Encounter: 05/01/2021  Primary Cardiologist:   Minus Breeding, MD   Subjective   She had some jaw pain last night.  However, this was not like her angina.    Inpatient Medications    Scheduled Meds:  carvedilol  3.125 mg Oral BID WC   Chlorhexidine Gluconate Cloth  6 each Topical Daily   enoxaparin (LOVENOX) injection  30 mg Subcutaneous Q24H   feeding supplement (GLUCERNA SHAKE)  237 mL Oral TID BM   ferrous sulfate  325 mg Oral BID WC   furosemide  40 mg Intravenous Daily   insulin aspart  0-15 Units Subcutaneous TID WC   isosorbide mononitrate  30 mg Oral Daily   levothyroxine  88 mcg Oral Q0600   pantoprazole  40 mg Oral Daily   sodium chloride flush  3 mL Intravenous Q12H   ticagrelor  90 mg Oral BID   Continuous Infusions:  sodium chloride 10 mL/hr at 04/23/21 1021   sodium chloride     PRN Meds: sodium chloride, acetaminophen, diazepam, labetalol, nitroGLYCERIN, sodium chloride flush   Vital Signs    Vitals:   04/30/21 1734 04/30/21 2326 05/01/21 0424 05/01/21 0740  BP: (!) 118/54 104/62 108/67 (!) 110/52  Pulse:  92 87 80  Resp:  20 19 19   Temp:  99 F (37.2 C) 97.7 F (36.5 C) 98.4 F (36.9 C)  TempSrc:  Oral Oral Oral  SpO2:  93% 100% 97%  Weight:   67.8 kg   Height:        Intake/Output Summary (Last 24 hours) at 05/01/2021 1103 Last data filed at 05/01/2021 0430 Gross per 24 hour  Intake 720 ml  Output 700 ml  Net 20 ml   Filed Weights   04/26/21 0600 04/30/21 0433 05/01/21 0424  Weight: 67.3 kg 68.7 kg 67.8 kg    Telemetry    NSR - Personally Reviewed  ECG    NA - Personally Reviewed  Physical Exam   GENERAL:  Well appearing NECK:  No jugular venous distention, waveform within normal limits, carotid upstroke brisk and symmetric, no bruits, no thyromegaly LUNGS:  Clear to auscultation bilaterally CHEST:  Unremarkable HEART:  PMI not displaced or sustained,S1 and S2 within  normal limits, no S3, no S4, no clicks, no rubs, 3/6 apical systolic murmur, no diastolic ABD:  Flat, positive bowel sounds normal in frequency in pitch, no bruits, no rebound, no guarding, no midline pulsatile mass, no hepatomegaly, no splenomegaly EXT:  2 plus pulses throughout, no edema, no cyanosis no clubbing    Labs    Chemistry Recent Labs  Lab 04/28/21 0204 04/29/21 0146 04/30/21 0205  NA 129* 134* 137  K 4.1 3.5 3.5  CL 97* 100 101  CO2 16* 20* 20*  GLUCOSE 217* 104* 111*  BUN 79* 80* 78*  CREATININE 2.68* 2.51* 2.53*  CALCIUM 7.6* 7.8* 8.0*  GFRNONAA 18* 19* 19*  ANIONGAP 16* 14 16*     Hematology Recent Labs  Lab 04/27/21 1038 04/28/21 0204 04/29/21 0146  WBC 9.9 7.9 8.0  RBC 3.38* 2.98* 2.93*   2.94*  HGB 9.3* 8.0* 7.7*  HCT 27.8* 25.0* 24.1*  MCV 82.2 83.9 82.3  MCH 27.5 26.8 26.3  MCHC 33.5 32.0 32.0  RDW 15.7* 15.4 15.6*  PLT 224 257 264    Cardiac EnzymesNo results for input(s): TROPONINI in the last 168 hours. No results for input(s): TROPIPOC in the last 168 hours.  BNP Recent Labs  Lab 04/27/21 0349  BNP 1,077.8*     DDimer No results for input(s): DDIMER in the last 168 hours.   Radiology    No results found.  Cardiac Studies   ECHO:  1. Septal , apical, lateral and posterior lateral hypokinesis . Left  ventricular ejection fraction, by estimation, is 30 to 35%. The left  ventricle has moderately decreased function. The left ventricle  demonstrates regional wall motion abnormalities  (see scoring diagram/findings for description). The left ventricular  internal cavity size was moderately dilated. There is mild left  ventricular hypertrophy. Left ventricular diastolic parameters are  indeterminate.   2. Right ventricular systolic function is normal. The right ventricular  size is normal. There is moderately elevated pulmonary artery systolic  pressure.   3. Left atrial size was mildly dilated.   4. The mitral valve is  abnormal. Moderate to severe mitral valve  regurgitation. No evidence of mitral stenosis. Severe mitral annular  calcification.   5. Tricuspid valve regurgitation is moderate.   6. The aortic valve is normal in structure. There is moderate  calcification of the aortic valve. There is moderate thickening of the  aortic valve. Aortic valve regurgitation is mild to moderate. Mild aortic  valve stenosis.   7. The inferior vena cava is normal in size with greater than 50%  respiratory variability, suggesting right atrial pressure of 3 mmHg.    Cardiac cath:   Diagnostic Dominance: Right Intervention     Patient Profile     81 y.o. female inferolateral STEMI, LV dysfunction, mitral regurgitation.     Assessment & Plan    STEMI:  DES to mid circ.   Chest pain this morning.  Continue medical management.    On ticagrelor alone because of anemia.     ACUTE SYSTOLIC HF:   Net negative 2.5 liters.    Scheduled for TEE Monday at 1:30.  Low BPs.  Unable to titrate meds with low BPs.   I will change to PO Lasix.   MR :  Plan is for TEE on Monday.    DYSLIPIDEMIA:    Continue current therapy.   HTN:  BP is low.  No med titration at this time.   DM:     Continue current therapy.     AKI:  Creat is mildly elevated yesterday.  Check BMET and CBC today.      ANEMIA:   Stopped ASA.     No obvious bleeding.  Check CBC.    For questions or updates, please contact [place] Please consult [email or web address] for contact info under Cardiology/STEMI.   Signed, Minus Breeding, MD  05/01/2021, 11:03 AM

## 2021-05-02 ENCOUNTER — Encounter (HOSPITAL_COMMUNITY): Payer: Self-pay | Admitting: Cardiovascular Disease

## 2021-05-02 ENCOUNTER — Inpatient Hospital Stay (HOSPITAL_COMMUNITY): Payer: Medicare Other | Admitting: Certified Registered Nurse Anesthetist

## 2021-05-02 ENCOUNTER — Encounter (HOSPITAL_COMMUNITY)
Admission: EM | Disposition: A | Discharge status: Home Health | Payer: Self-pay | Source: Home / Self Care | Attending: Cardiovascular Disease

## 2021-05-02 ENCOUNTER — Ambulatory Visit: Payer: Medicare Other | Admitting: Internal Medicine

## 2021-05-02 ENCOUNTER — Inpatient Hospital Stay (HOSPITAL_COMMUNITY): Payer: Medicare Other

## 2021-05-02 DIAGNOSIS — I3139 Other pericardial effusion (noninflammatory): Secondary | ICD-10-CM

## 2021-05-02 DIAGNOSIS — I252 Old myocardial infarction: Secondary | ICD-10-CM

## 2021-05-02 DIAGNOSIS — I081 Rheumatic disorders of both mitral and tricuspid valves: Secondary | ICD-10-CM

## 2021-05-02 DIAGNOSIS — I340 Nonrheumatic mitral (valve) insufficiency: Secondary | ICD-10-CM | POA: Diagnosis not present

## 2021-05-02 DIAGNOSIS — I2510 Atherosclerotic heart disease of native coronary artery without angina pectoris: Secondary | ICD-10-CM

## 2021-05-02 DIAGNOSIS — I10 Essential (primary) hypertension: Secondary | ICD-10-CM

## 2021-05-02 DIAGNOSIS — N179 Acute kidney failure, unspecified: Secondary | ICD-10-CM | POA: Diagnosis not present

## 2021-05-02 DIAGNOSIS — I351 Nonrheumatic aortic (valve) insufficiency: Secondary | ICD-10-CM

## 2021-05-02 DIAGNOSIS — I255 Ischemic cardiomyopathy: Secondary | ICD-10-CM

## 2021-05-02 DIAGNOSIS — I2121 ST elevation (STEMI) myocardial infarction involving left circumflex coronary artery: Secondary | ICD-10-CM | POA: Diagnosis not present

## 2021-05-02 HISTORY — PX: TEE WITHOUT CARDIOVERSION: SHX5443

## 2021-05-02 LAB — BASIC METABOLIC PANEL
Anion gap: 17 — ABNORMAL HIGH (ref 5–15)
BUN: 73 mg/dL — ABNORMAL HIGH (ref 8–23)
CO2: 19 mmol/L — ABNORMAL LOW (ref 22–32)
Calcium: 7.9 mg/dL — ABNORMAL LOW (ref 8.9–10.3)
Chloride: 104 mmol/L (ref 98–111)
Creatinine, Ser: 2.38 mg/dL — ABNORMAL HIGH (ref 0.44–1.00)
GFR, Estimated: 20 mL/min — ABNORMAL LOW (ref >60)
Glucose, Bld: 134 mg/dL — ABNORMAL HIGH (ref 70–99)
Potassium: 3.6 mmol/L (ref 3.5–5.1)
Sodium: 140 mmol/L (ref 135–145)

## 2021-05-02 LAB — GLUCOSE, CAPILLARY
Glucose-Capillary: 168 mg/dL — ABNORMAL HIGH (ref 70–99)
Glucose-Capillary: 150 mg/dL — ABNORMAL HIGH (ref 70–99)
Glucose-Capillary: 134 mg/dL — ABNORMAL HIGH (ref 70–99)
Glucose-Capillary: 235 mg/dL — ABNORMAL HIGH (ref 70–99)

## 2021-05-02 LAB — ECHO TEE
MV M vel: 5.05 m/s
MV Peak grad: 102 mmHg
Radius: 0.8 cm

## 2021-05-02 SURGERY — ECHOCARDIOGRAM, TRANSESOPHAGEAL
Anesthesia: Monitor Anesthesia Care

## 2021-05-02 MED ORDER — POTASSIUM CHLORIDE CRYS ER 20 MEQ PO TBCR
20.0000 meq | EXTENDED_RELEASE_TABLET | Freq: Once | ORAL | Status: AC
  Administered 2021-05-02: 20 meq via ORAL
  Filled 2021-05-02: qty 1

## 2021-05-02 MED ORDER — PHENYLEPHRINE 40 MCG/ML (10ML) SYRINGE FOR IV PUSH (FOR BLOOD PRESSURE SUPPORT)
PREFILLED_SYRINGE | INTRAVENOUS | Status: DC | PRN
  Administered 2021-05-02 (×3): 80 ug via INTRAVENOUS
  Administered 2021-05-02 (×2): 120 ug via INTRAVENOUS
  Administered 2021-05-02: 40 ug via INTRAVENOUS

## 2021-05-02 MED ORDER — PROPOFOL 500 MG/50ML IV EMUL
INTRAVENOUS | Status: DC | PRN
  Administered 2021-05-02: 50 ug/kg/min via INTRAVENOUS

## 2021-05-02 MED ORDER — ASPIRIN EC 81 MG PO TBEC
81.0000 mg | DELAYED_RELEASE_TABLET | Freq: Every day | ORAL | Status: DC
  Administered 2021-05-02 – 2021-05-12 (×11): 81 mg via ORAL
  Filled 2021-05-02 (×11): qty 1

## 2021-05-02 NOTE — CV Procedure (Signed)
Brief TEE Note  LVEF ~40% No LA/LAA thrombus or masses Moderate-severe mitral regurgitation.  MV regurgitant volume 50 mL.  No pulmonary vein flow reversal. Mitral annular calcification.  Moderate tricuspid regurgitation.   For additional details see full report.   [name]. Oval Linsey, MD, [facility] 05/02/2021 2:09 PM

## 2021-05-02 NOTE — Interval H&P Note (Signed)
History and Physical Interval Note:  05/02/2021 1:01 PM  Karen West  has presented today for surgery, with the diagnosis of MR.  The various methods of treatment have been discussed with the patient and family. After consideration of risks, benefits and other options for treatment, the patient has consented to  Procedure(s): TRANSESOPHAGEAL ECHOCARDIOGRAM (TEE) (N/A) as a surgical intervention.  The patient's history has been reviewed, patient examined, no change in status, stable for surgery.  I have reviewed the patient's chart and labs.  Questions were answered to the patient's satisfaction.     Skeet Latch, MD

## 2021-05-02 NOTE — Interval H&P Note (Signed)
History and Physical Interval Note:  05/02/2021 10:43 AM  Karen West  has presented today for surgery, with the diagnosis of MR.  The various methods of treatment have been discussed with the patient and family. After consideration of risks, benefits and other options for treatment, the patient has consented to  Procedure(s): TRANSESOPHAGEAL ECHOCARDIOGRAM (TEE) (N/A) as a surgical intervention.  The patient's history has been reviewed, patient examined, no change in status, stable for surgery.  I have reviewed the patient's chart and labs.  Questions were answered to the patient's satisfaction.     Skeet Latch, MD

## 2021-05-02 NOTE — Progress Notes (Signed)
Inpatient Diabetes Program Recommendations  AACE/ADA: New Consensus Statement on Inpatient Glycemic Control (2015)  Target Ranges:  Prepandial:   less than 140 mg/dL      Peak postprandial:   less than 180 mg/dL (1-2 hours)      Critically ill patients:  140 - 180 mg/dL   Lab Results  Component Value Date   GLUCAP 168 (H) 05/02/2021   HGBA1C 6.0 (H) 04/25/2021    Review of Glycemic Control  Latest Reference Range & Units 05/01/21 06:28 05/01/21 11:34 05/01/21 16:21 05/01/21 21:14 05/02/21 06:02  Glucose-Capillary 70 - 99 mg/dL 129 (H) 257 (H)  Novolog 8 units 284 (H)  Novolog 8 units 249 (H) 168 (H)  Novolog 3 units   Diabetes history: DM 2 Outpatient Diabetes medications: Trijardy 12.5-2.07-998 mg bid (Empagliflozin - linagliptin - metformin) Current orders for Inpatient glycemic control:  Novolog 0-15 units tid  Glucerna tid between meals A1c 6% on 2/6  Inpatient Diabetes Program Recommendations:    - Add Novolog 3 units tid meal coverage when eating to prevent postprandial spikes.  Thanks,  Tama Headings RN, MSN, BC-ADM Inpatient Diabetes Coordinator Team Pager [phone] (8a-5p)

## 2021-05-02 NOTE — Progress Notes (Addendum)
Endo called to inform about pts current state/request. Pt is experiencing high levels of anxiety and requested MD to provide a second teach back of procedure. Anxiety meds will be given 1 hr prior to the procedure to help w/ anxiety related to procedure.

## 2021-05-02 NOTE — Progress Notes (Addendum)
Progress Note  Patient Name: Karen West Date of Encounter: 05/02/2021  [facility] Cardiologist: Minus Breeding, MD   Subjective   Overall, feeling better. Planned for TEE today. No chest pain. Breathing is stable, on RA.  Inpatient Medications    Scheduled Meds:  carvedilol  3.125 mg Oral BID WC   Chlorhexidine Gluconate Cloth  6 each Topical Daily   enoxaparin (LOVENOX) injection  30 mg Subcutaneous Q24H   feeding supplement (GLUCERNA SHAKE)  237 mL Oral TID BM   ferrous sulfate  325 mg Oral BID WC   insulin aspart  0-15 Units Subcutaneous TID WC   isosorbide mononitrate  30 mg Oral Daily   levothyroxine  88 mcg Oral Q0600   pantoprazole  40 mg Oral Daily   sodium chloride flush  3 mL Intravenous Q12H   ticagrelor  90 mg Oral BID   Continuous Infusions:  sodium chloride 10 mL/hr at 04/23/21 1021   sodium chloride     PRN Meds: sodium chloride, acetaminophen, diazepam, labetalol, nitroGLYCERIN, sodium chloride flush   Vital Signs    Vitals:   05/01/21 1953 05/02/21 0229 05/02/21 0309 05/02/21 0724  BP: 137/62 (!) 143/78 (!) 126/57 (!) 118/49  Pulse: (!) 20 89 86 93  Resp: _0 Temp: (!) 97.5 F (36.4 C) 98.1 F (36.7 C) 98.6 F (37 C) 98.4 F (36.9 C)  TempSrc: Oral Oral Oral Oral  SpO2: 100% 100% 100% 97%  Weight:      Height:        Intake/Output Summary (Last 24 hours) at 05/02/2021 0953 Last data filed at 05/02/2021 0745 Gross per 24 hour  Intake --  Output 400 ml  Net -400 ml   Last 3 Weights 05/01/2021 04/30/2021 04/26/2021  Weight (lbs) 149 lb 8 oz 151 lb 7.3 oz 148 lb 5.9 oz  Weight (kg) 67.813 kg 68.7 kg 67.3 kg      Telemetry    SR, PVCs - Personally Reviewed  ECG    No new tracing  Physical Exam   GEN: No acute distress.   Neck: No JVD Cardiac: RRR, + systolic murmur LLSB, no rubs, or gallops.  Respiratory: Clear to auscultation bilaterally. GI: Soft, nontender, non-distended  MS: No edema; No deformity. Neuro:   Nonfocal  Psych: Normal affect   Labs    High Sensitivity Troponin:   Recent Labs  Lab 04/23/21 1000 04/23/21 1222  TROPONINIHS 22,485* >24,000*     Chemistry Recent Labs  Lab 04/29/21 0146 04/30/21 0205 05/01/21 1354 05/02/21 0211  NA 134* 137 134* 140  K 3.5 3.5 3.6 3.6  CL 100 101 100 104  CO2 20* 20* 17* 19*  GLUCOSE 104* 111* 289* 134*  BUN 80* 78* 74* 73*  CREATININE 2.51* 2.53* 2.51* 2.38*  CALCIUM 7.8* 8.0* 7.7* 7.9*  MG 2.1  --   --   --   GFRNONAA 19* 19* 19* 20*  ANIONGAP 14 16* 17* 17*    Lipids  Recent Labs  Lab 04/28/21 0204  CHOL 123  TRIG 99  HDL 45  LDLCALC 58  CHOLHDL 2.7    Hematology Recent Labs  Lab 04/28/21 0204 04/29/21 0146 05/01/21 1354  WBC 7.9 8.0 8.6  RBC 2.98* 2.93*   2.94* 3.31*  HGB 8.0* 7.7* 9.0*  HCT 25.0* 24.1* 28.2*  MCV 83.9 82.3 85.2  MCH 26.8 26.3 27.2  MCHC 32.0 32.0 31.9  RDW 15.4 15.6* 15.9*  PLT 257 264 342  Thyroid No results for input(s): TSH, FREET4 in the last 168 hours.  BNP Recent Labs  Lab 04/27/21 0349  BNP 1,077.8*    DDimer No results for input(s): DDIMER in the last 168 hours.   Radiology    No results found.  Cardiac Studies   Cath: 04/23/21     Prox LAD to Mid LAD lesion is 70% stenosed.   Prox LAD lesion is 50% stenosed.   Mid Cx lesion is 100% stenosed.   Dist Cx lesion is 80% stenosed.   3rd Mrg lesion is 100% stenosed.   Non-stenotic Dist RCA lesion was previously treated.   Non-stenotic Mid RCA to Dist RCA lesion was previously treated.   A drug-eluting stent was successfully placed.   Post intervention, there is a 0% residual stenosis.   Post intervention, there is a 0% residual stenosis.   There is moderate left ventricular systolic dysfunction.   Acute inferolateral wall ST segment elevation secondary to total occlusion of a large left circumflex coronary artery with TIMI 0 flow.   Calcification involving the proximal LAD with 50 and 70% stenoses.   Widely patent  dominant RCA with previously placed 4 tandem stents proximally to distally without restenosis.   Moderate acute LV dysfunction with EF estimated at 35 to 40% with hypocontractility involving the mid distal anterolateral wall.  LVEDP 30 mm   Successful percutaneous coronary intervention to the left circumflex coronary artery with ultimate insertion of a 2.75 x 26 mm Medtronic Onyx frontiers stent postdilated to 3.04 mm with the 100% occlusion being reduced to 0% and brisk TIMI-3 flow in the major vessel.  There is residual thrombus and a small marginal branch arising from the stented segment which was totally occluded initially.  There also was faint thrombus in a very distal branch of the circumflex.  The plan is to continue Aggrastat for 18 hours post procedure.   RECOMMENDATION: DAPT indefinitely.  We will continue Aggrastat 18 hours postinfusion.  Optimize blood pressure.  Plan 2D echo Doppler study.  Continue carvedilol, consider ARB and transition to [facility] if LV function remains impaired.  Continue Praluent/Zetia with statin intolerance and probable familial hyperlipidemia.   Diagnostic Dominance: Right Intervention     Echo: 04/24/21   IMPRESSIONS     1. Septal , apical, lateral and posterior lateral hypokinesis . Left  ventricular ejection fraction, by estimation, is 30 to 35%. The left  ventricle has moderately decreased function. The left ventricle  demonstrates regional wall motion abnormalities  (see scoring diagram/findings for description). The left ventricular  internal cavity size was moderately dilated. There is mild left  ventricular hypertrophy. Left ventricular diastolic parameters are  indeterminate.   2. Right ventricular systolic function is normal. The right ventricular  size is normal. There is moderately elevated pulmonary artery systolic  pressure.   3. Left atrial size was mildly dilated.   4. The mitral valve is abnormal. Moderate to severe mitral valve   regurgitation. No evidence of mitral stenosis. Severe mitral annular  calcification.   5. Tricuspid valve regurgitation is moderate.   6. The aortic valve is normal in structure. There is moderate  calcification of the aortic valve. There is moderate thickening of the  aortic valve. Aortic valve regurgitation is mild to moderate. Mild aortic  valve stenosis.   7. The inferior vena cava is normal in size with greater than 50%  respiratory variability, suggesting right atrial pressure of 3 mmHg.   FINDINGS   Left Ventricle: Septal ,  apical, lateral and posterior lateral  hypokinesis. Left ventricular ejection fraction, by estimation, is 30 to  35%. The left ventricle has moderately decreased function. The left  ventricle demonstrates regional wall motion  abnormalities. The left ventricular internal cavity size was moderately  dilated. There is mild left ventricular hypertrophy. Left ventricular  diastolic parameters are indeterminate.   Right Ventricle: The right ventricular size is normal. No increase in  right ventricular wall thickness. Right ventricular systolic function is  normal. There is moderately elevated pulmonary artery systolic pressure.  The tricuspid regurgitant velocity is  3.48 m/s, and with an assumed right atrial pressure of 8 mmHg, the  estimated right ventricular systolic pressure is 35.0 mmHg.   Left Atrium: Left atrial size was mildly dilated.   Right Atrium: Right atrial size was normal in size.   Pericardium: There is no evidence of pericardial effusion.   Mitral Valve: The mitral valve is abnormal. There is severe thickening of  the mitral valve leaflet(s). There is severe calcification of the mitral  valve leaflet(s). Severe mitral annular calcification. Moderate to severe  mitral valve regurgitation. No  evidence of mitral valve stenosis. MV peak gradient, 8.0 mmHg. The mean  mitral valve gradient is 3.0 mmHg.   Tricuspid Valve: The tricuspid valve  is normal in structure. Tricuspid  valve regurgitation is moderate . No evidence of tricuspid stenosis.   Aortic Valve: The aortic valve is normal in structure. There is moderate  calcification of the aortic valve. There is moderate thickening of the  aortic valve. Aortic valve regurgitation is mild to moderate. Aortic  regurgitation PHT measures 345 msec. Mild   aortic stenosis is present. Aortic valve mean gradient measures 4.0 mmHg.  Aortic valve peak gradient measures 7.6 mmHg. Aortic valve area, by VTI  measures 2.09 cm.   Pulmonic Valve: The pulmonic valve was normal in structure. Pulmonic valve  regurgitation is not visualized. No evidence of pulmonic stenosis.   Aorta: The aortic root is normal in size and structure.   Venous: The inferior vena cava is normal in size with greater than 50%  respiratory variability, suggesting right atrial pressure of 3 mmHg.   IAS/Shunts: No atrial level shunt detected by color flow Doppler.   Patient Profile     81 y.o. female  with PMH of CAD s/p DES to RCA in 2021, severe MAC, HTN, HLD, multinodular thyroid s/p thyroidectomy in 2019, anemia, GERD, known RBBB, DM2, OSA, carotid artery disease with 50-69% stenosis bilaterally, breast cancer, and lung cancer s/p LVATS 2019, negative myoview stress test in 2013 who was seen 04/23/2021 for the evaluation of STEMI.  Assessment & Plan    STEMI: underwent cardiac cath noted above with successful PCI/DES x1  of mLcx. Did have residual thrombus in small marginal branch from stented segment. Also with 50-70% pLAD stenosis to be treated medically. Treated with aggrastat for 18 hr post cath. hsTn >24000. Plan for DAPT with ASA/Brilnta for at least one year. Working with CR. ASA was held briefly as her Hgb dropped, but will resume now with improvement. -- on ASA, Brilinta, statin, BB, Imdur    HFrEF/ICM: LVEF of 30-35%, with septal, apical, lateral and posterior lateral hypokinesis. Diuresed with IV lasix,   net - 2.8L, weight down 154.1>>149.5lbs -- GDMT limited in the setting of AKI and low blood pressures -- continue BB, attempt to further titrate prior to DC as BP allows   Acute kidney injury: baseline Cr 1.3, up to 2.59 post cath with  diuresis, stable at 2.38 this morning -- follow BMET   Moderate to severe MR: MV peak gradient, 8.0 mmHg. The mean  mitral valve gradient is 3.0 mmHg.  -- planned for TEE today   HLD: LDL 58 -- on praluent PTA reports compliance   HTN: blood pressures have been borderline soft -- continue coreg 3.121m BID   DM: Hgb A1c 6.1 -- on Trijardy PTA (empag-linaglip-metfor) -- SSI while inpatient  Anemia: steady decline since admission 10.1>>7.7. Improved this morning at 9.0 -- ASA initially held with drop, will resume  -- Stool cards pending   For questions or updates, please contact CHoughtonPlease consult [email or web address] for contact info under        Signed, LReino Bellis NP  05/02/2021, 9:53 AM    Patient seen and examined. Agree with assessment and plan. No recurrent chest pain. For TEE today for further assessment of  MR and hopefully improvement in LV fxn. Cr slowly improving post cath/PCI now 2.38. I/O -[id number] since admission.    TTroy Sine MD, FLiber[facility]2/13/2023 10:48 AM

## 2021-05-02 NOTE — Care Management Important Message (Signed)
Important Message  Patient Details  Name: Karen West MRN: [id number] Date of Birth: 1940/04/09   Medicare Important Message Given:  Yes     [name] 05/02/2021, 9:18 AM

## 2021-05-02 NOTE — Transfer of Care (Signed)
Immediate Anesthesia Transfer of Care Note  Patient: Karen West  Procedure(s) Performed: TRANSESOPHAGEAL ECHOCARDIOGRAM (TEE)  Patient Location: PACU and Endoscopy Unit  Anesthesia Type:MAC  Level of Consciousness: drowsy  Airway & Oxygen Therapy: Patient Spontanous Breathing and Patient connected to nasal cannula oxygen  Post-op Assessment: Report given to RN and Post -op Vital signs reviewed and stable  Post vital signs: Reviewed and stable  Last Vitals:  Vitals Value Taken Time  BP    Temp    Pulse    Resp    SpO2      Last Pain:  Vitals:   05/02/21 1251  TempSrc: Temporal  PainSc: 0-No pain      Patients Stated Pain Goal: 0 (44/69/50 7225)  Complications: No notable events documented.

## 2021-05-02 NOTE — Progress Notes (Signed)
°  Echocardiogram Echocardiogram Transesophageal has been performed.  Karen West 05/02/2021, 2:33 PM

## 2021-05-02 NOTE — Anesthesia Preprocedure Evaluation (Signed)
Anesthesia Evaluation  Patient identified by MRN, date of birth, ID band Patient awake    Reviewed: Allergy & Precautions, NPO status , Patient's Chart, lab work & pertinent test results  History of Anesthesia Complications Negative for: history of anesthetic complications  Airway Mallampati: III  TM Distance: >3 FB Neck ROM: Full    Dental  (+) Dental Advisory Given, Teeth Intact   Pulmonary shortness of breath, sleep apnea ,    + rhonchi        Cardiovascular hypertension, Pt. on home beta blockers and Pt. on medications (-) angina+ CAD, + Past MI and + Cardiac Stents   Rhythm:Regular  1. Septal , apical, lateral and posterior lateral hypokinesis . Left  ventricular ejection fraction, by estimation, is 30 to 35%. The left  ventricle has moderately decreased function. The left ventricle  demonstrates regional wall motion abnormalities  (see scoring diagram/findings for description). The left ventricular  internal cavity size was moderately dilated. There is mild left  ventricular hypertrophy. Left ventricular diastolic parameters are  indeterminate.  2. Right ventricular systolic function is normal. The right ventricular  size is normal. There is moderately elevated pulmonary artery systolic  pressure.  3. Left atrial size was mildly dilated.  4. The mitral valve is abnormal. Moderate to severe mitral valve  regurgitation. No evidence of mitral stenosis. Severe mitral annular  calcification.  5. Tricuspid valve regurgitation is moderate.  6. The aortic valve is normal in structure. There is moderate  calcification of the aortic valve. There is moderate thickening of the  aortic valve. Aortic valve regurgitation is mild to moderate. Mild aortic  valve stenosis.  7. The inferior vena cava is normal in size with greater than 50%  respiratory variability, suggesting right atrial pressure of 3 mmHg.     Prox LAD to Mid  LAD lesion is 70% stenosed.   Prox LAD lesion is 50% stenosed.   Mid Cx lesion is 100% stenosed.   Dist Cx lesion is 80% stenosed.   3rd Mrg lesion is 100% stenosed.   Non-stenotic Dist RCA lesion was previously treated.   Non-stenotic Mid RCA to Dist RCA lesion was previously treated.   A drug-eluting stent was successfully placed.   Post intervention, there is a 0% residual stenosis.   Post intervention, there is a 0% residual stenosis.   There is moderate left ventricular systolic dysfunction.  Acute inferolateral wall ST segment elevation secondary to total occlusion of a large left circumflex coronary artery with TIMI 0 flow.  Calcification involving the proximal LAD with 50 and 70% stenoses.  Widely patent dominant RCA with previously placed 4 tandem stents proximally to distally without restenosis.  Moderate acute LV dysfunction with EF estimated at 35 to 40% with hypocontractility involving the mid distal anterolateral wall.  LVEDP 30 mm  Successful percutaneous coronary intervention to the left circumflex coronary artery with ultimate insertion of a 2.75 x 26 mm Medtronic Onyx frontiers stent postdilated to 3.04 mm with the 100% occlusion being reduced to 0% and brisk TIMI-3 flow in the major vessel.  There is residual thrombus and a small marginal branch arising from the stented segment which was totally occluded initially.  There also was faint thrombus in a very distal branch of the circumflex.  The plan is to continue Aggrastat for 18 hours post procedure.    Neuro/Psych  Headaches,  Neuromuscular disease negative psych ROS   GI/Hepatic Neg liver ROS, GERD  ,  Endo/Other  diabetesHypothyroidism Lab Results  Component                Value               Date                      HGBA1C                   6.0 (H)             04/25/2021             Renal/GU Renal InsufficiencyRenal diseaseLab Results      Component                Value               Date                       CREATININE               2.38 (H)            05/02/2021                Musculoskeletal  (+) Arthritis ,   Abdominal   Peds  Hematology  (+) Blood dyscrasia, anemia , Lab Results      Component                Value               Date                      WBC                      8.6                 05/01/2021                HGB                      9.0 (L)             05/01/2021                HCT                      28.2 (L)            05/01/2021                MCV                      85.2                05/01/2021                PLT                      342                 05/01/2021              Anesthesia Other Findings   Reproductive/Obstetrics                            Anesthesia Physical Anesthesia Plan  ASA: 4  Anesthesia Plan: MAC   Post-op Pain Management:  Minimal or no pain anticipated   Induction: Intravenous  PONV Risk Score and Plan: 2 and Treatment may vary due to age or medical condition  Airway Management Planned: Nasal Cannula  Additional Equipment: None  Intra-op Plan:   Post-operative Plan:   Informed Consent: I have reviewed the patients History and Physical, chart, labs and discussed the procedure including the risks, benefits and alternatives for the proposed anesthesia with the patient or authorized representative who has indicated his/her understanding and acceptance.     Dental advisory given  Plan Discussed with: CRNA and Anesthesiologist  Anesthesia Plan Comments:         Anesthesia Quick Evaluation

## 2021-05-02 NOTE — Progress Notes (Signed)
CARDIAC REHAB PHASE I   PRE:  Rate/Rhythm: 89 SR    BP: sitting 131/76    SaO2: 98 RA  MODE:  Ambulation: 180 ft   POST:  Rate/Rhythm: 110 ST    BP: sitting 118/58    SaO2: 97 RA   Fairly steady with RW and standby assist. C/o SOB and fatigue with rest x3, bending over for relief. Encouraged deep breaths standing upright. VSS, to chair in front of sink as pt had been planning to wash up. Left with call bell in reach. Denied CP. [phone]  Darrick Meigs CES, ACSM 05/02/2021 11:01 AM

## 2021-05-03 DIAGNOSIS — N179 Acute kidney failure, unspecified: Secondary | ICD-10-CM | POA: Diagnosis not present

## 2021-05-03 DIAGNOSIS — I340 Nonrheumatic mitral (valve) insufficiency: Secondary | ICD-10-CM | POA: Diagnosis not present

## 2021-05-03 DIAGNOSIS — I2121 ST elevation (STEMI) myocardial infarction involving left circumflex coronary artery: Secondary | ICD-10-CM | POA: Diagnosis not present

## 2021-05-03 LAB — GLUCOSE, CAPILLARY
Glucose-Capillary: 181 mg/dL — ABNORMAL HIGH (ref 70–99)
Glucose-Capillary: 189 mg/dL — ABNORMAL HIGH (ref 70–99)
Glucose-Capillary: 192 mg/dL — ABNORMAL HIGH (ref 70–99)
Glucose-Capillary: 227 mg/dL — ABNORMAL HIGH (ref 70–99)

## 2021-05-03 LAB — BASIC METABOLIC PANEL
Anion gap: 13 (ref 5–15)
BUN: 66 mg/dL — ABNORMAL HIGH (ref 8–23)
CO2: 21 mmol/L — ABNORMAL LOW (ref 22–32)
Calcium: 7.7 mg/dL — ABNORMAL LOW (ref 8.9–10.3)
Chloride: 101 mmol/L (ref 98–111)
Creatinine, Ser: 2.37 mg/dL — ABNORMAL HIGH (ref 0.44–1.00)
GFR, Estimated: 20 mL/min — ABNORMAL LOW (ref >60)
Glucose, Bld: 260 mg/dL — ABNORMAL HIGH (ref 70–99)
Potassium: 3.6 mmol/L (ref 3.5–5.1)
Sodium: 135 mmol/L (ref 135–145)

## 2021-05-03 MED ORDER — METOPROLOL SUCCINATE ER 25 MG PO TB24: 12.5000 mg | ORAL_TABLET | Freq: Every day | ORAL | Status: DC

## 2021-05-03 MED ORDER — CARVEDILOL 3.125 MG PO TABS
3.1250 mg | ORAL_TABLET | Freq: Two times a day (BID) | ORAL | Status: DC
  Administered 2021-05-03 – 2021-05-11 (×16): 3.125 mg via ORAL
  Filled 2021-05-03 (×16): qty 1

## 2021-05-03 NOTE — Progress Notes (Signed)
Mobility Specialist Progress Note   05/03/21 1210  Mobility  Activity Ambulated with assistance in hallway  Level of Assistance Minimal assist, patient does 75% or more  Assistive Device None  Distance Ambulated (ft) 230 ft  Activity Response Tolerated fair  $Mobility charge 1 Mobility   Received pt EOB w/ no complaints and eager for mobility. While ambulating pt having present signs of DOE and CP.  x2 standing rest breaks w/ pt bending over while catching breath on second break. Advised pt on standing tall and practicing PLB to regain breath. As CP continued max encouragement required to have pt take X1 seated break. Wheeled back to room for a vital check and signs were stable. Pt left sitting EOB w/ call bell in reach. RN notified     Pre Mobility: 82 HR, 107/64 BP During Mobility: 96 HR, Post Mobility: 84 HR, 117/64 BP  Holland Falling Mobility Specialist Phone Number [phone]

## 2021-05-03 NOTE — Progress Notes (Addendum)
Progress Note  Patient Name: Karen West Date of Encounter: 05/03/2021  [facility] Cardiologist: Minus Breeding, MD   Subjective   No chest pain this morning. Was able to ambulate with CR yesterday.   Inpatient Medications    Scheduled Meds:  aspirin EC  81 mg Oral Daily   carvedilol  3.125 mg Oral BID WC   Chlorhexidine Gluconate Cloth  6 each Topical Daily   enoxaparin (LOVENOX) injection  30 mg Subcutaneous Q24H   feeding supplement (GLUCERNA SHAKE)  237 mL Oral TID BM   ferrous sulfate  325 mg Oral BID WC   insulin aspart  0-15 Units Subcutaneous TID WC   isosorbide mononitrate  30 mg Oral Daily   levothyroxine  88 mcg Oral Q0600   pantoprazole  40 mg Oral Daily   sodium chloride flush  3 mL Intravenous Q12H   ticagrelor  90 mg Oral BID   Continuous Infusions:  sodium chloride Stopped (05/02/21 1428)   sodium chloride     PRN Meds: sodium chloride, acetaminophen, diazepam, labetalol, nitroGLYCERIN, sodium chloride flush   Vital Signs    Vitals:   05/02/21 2325 05/03/21 0400 05/03/21 0412 05/03/21 0750  BP: 115/69   90/62  Pulse: 82  90 72  Resp: _0 Temp: 98.5 F (36.9 C)  97.8 F (36.6 C) 97.7 F (36.5 C)  TempSrc: Oral  Oral Oral  SpO2: 100%  98% 98%  Weight:      Height:        Intake/Output Summary (Last 24 hours) at 05/03/2021 0924 Last data filed at 05/03/2021 0800 Gross per 24 hour  Intake 100 ml  Output 800 ml  Net -700 ml   Last 3 Weights 05/01/2021 04/30/2021 04/26/2021  Weight (lbs) 149 lb 8 oz 151 lb 7.3 oz 148 lb 5.9 oz  Weight (kg) 67.813 kg 68.7 kg 67.3 kg      Telemetry    SR - Personally Reviewed  ECG    No new tracing  Physical Exam   GEN: No acute distress.   Neck: No JVD Cardiac: RRR, + systolic murmur LLUB, no rubs, or gallops.  Respiratory: Clear to auscultation bilaterally. GI: Soft, nontender, non-distended  MS: No edema; No deformity. Neuro:  Nonfocal  Psych: Normal affect   Labs    High  Sensitivity Troponin:   Recent Labs  Lab 04/23/21 1000 04/23/21 1222  TROPONINIHS 22,485* >24,000*     Chemistry Recent Labs  Lab 04/29/21 0146 04/30/21 0205 05/01/21 1354 05/02/21 0211  NA 134* 137 134* 140  K 3.5 3.5 3.6 3.6  CL 100 101 100 104  CO2 20* 20* 17* 19*  GLUCOSE 104* 111* 289* 134*  BUN 80* 78* 74* 73*  CREATININE 2.51* 2.53* 2.51* 2.38*  CALCIUM 7.8* 8.0* 7.7* 7.9*  MG 2.1  --   --   --   GFRNONAA 19* 19* 19* 20*  ANIONGAP 14 16* 17* 17*    Lipids  Recent Labs  Lab 04/28/21 0204  CHOL 123  TRIG 99  HDL 45  LDLCALC 58  CHOLHDL 2.7    Hematology Recent Labs  Lab 04/28/21 0204 04/29/21 0146 05/01/21 1354  WBC 7.9 8.0 8.6  RBC 2.98* 2.93*   2.94* 3.31*  HGB 8.0* 7.7* 9.0*  HCT 25.0* 24.1* 28.2*  MCV 83.9 82.3 85.2  MCH 26.8 26.3 27.2  MCHC 32.0 32.0 31.9  RDW 15.4 15.6* 15.9*  PLT 257 264 342   Thyroid No  results for input(s): TSH, FREET4 in the last 168 hours.  BNP Recent Labs  Lab 04/27/21 0349  BNP 1,077.8*    DDimer No results for input(s): DDIMER in the last 168 hours.   Radiology    ECHO TEE  Result Date: 05/02/2021    TRANSESOPHOGEAL ECHO REPORT   Patient Name:   Karen West Date of Exam: 05/02/2021 Medical Rec #:  [id number]        Height:       61.0 in Accession #:    [id number]       Weight:       149.5 lb Date of Birth:  May 02, 1940         BSA:          1.669 m Patient Age:    81 years         BP:           114/53 mmHg Patient Gender: F                HR:           75 bpm. Exam Location:  Inpatient Procedure: 3D Echo, Transesophageal Echo, Cardiac Doppler and Color Doppler Indications:     Mitral regurgitation  History:         Patient has prior history of Echocardiogram examinations, most                  recent 04/24/2021. Mitral Valve Disease, Arrythmias:RBBB; Risk                  Factors:Hypertension, Dyslipidemia and Diabetes.  Sonographer:     Clayton Lefort RDCS (AE) Referring Phys:  [id number] [facility] [place] Diagnosing Phys:  Skeet Latch MD PROCEDURE: After discussion of the risks and benefits of a TEE, an informed consent was obtained from the patient. The transesophogeal probe was passed without difficulty through the esophogus of the patient. Sedation performed by different physician. The patient was monitored while under deep sedation. Anesthestetic sedation was provided intravenously by Anesthesiology: 144.73m of Propofol. Image quality was adequate. The patient's vital signs; including heart rate, blood pressure, and oxygen saturation; remained stable throughout the procedure. The patient developed no complications during the procedure. IMPRESSIONS  1. Anterior, inferior, posterolateral, and anterolateral hypokinesis. Left ventricular ejection fraction, by estimation, is 40 to 45%. The left ventricle has mildly decreased function. The left ventricle has no regional wall motion abnormalities.  2. Right ventricular systolic function is normal. The right ventricular size is normal.  3. No left atrial/left atrial appendage thrombus was detected.  4. A small pericardial effusion is present.  5. Regurgitatnt volume 50 mL. No evidence of pulmonary vein flow reversal. The mitral valve is normal in structure. Moderate mitral valve regurgitation. No evidence of mitral stenosis.  6. Restricted motion of the left coronary cusp. The aortic valve is tricuspid. There is mild calcification of the aortic valve. There is mild thickening of the aortic valve. Aortic valve regurgitation is mild. No aortic stenosis is present.  7. There is mild (Grade II) atheroma plaque involving the descending aorta.  8. The inferior vena cava is normal in size with greater than 50% respiratory variability, suggesting right atrial pressure of 3 mmHg. Conclusion(s)/Recommendation(s): Normal biventricular function without evidence of hemodynamically significant valvular heart disease. FINDINGS  Left Ventricle: Anterior, inferior, posterolateral, and anterolateral  hypokinesis. Left ventricular ejection fraction, by estimation, is 40 to 45%. The left ventricle has mildly decreased function. The left ventricle has no regional  wall motion abnormalities. The left ventricular internal cavity size was normal in size. There is no left ventricular hypertrophy. Right Ventricle: The right ventricular size is normal. No increase in right ventricular wall thickness. Right ventricular systolic function is normal. Left Atrium: Left atrial size was normal in size. No left atrial/left atrial appendage thrombus was detected. Right Atrium: Right atrial size was normal in size. Pericardium: A small pericardial effusion is present. Mitral Valve: Regurgitatnt volume 50 mL. No evidence of pulmonary vein flow reversal. The mitral valve is normal in structure. Mild mitral annular calcification. Moderate mitral valve regurgitation. No evidence of mitral valve stenosis. Tricuspid Valve: The tricuspid valve is normal in structure. Tricuspid valve regurgitation is mild . No evidence of tricuspid stenosis. Aortic Valve: Restricted motion of the left coronary cusp. The aortic valve is tricuspid. There is mild calcification of the aortic valve. There is mild thickening of the aortic valve. Aortic valve regurgitation is mild. No aortic stenosis is present. Pulmonic Valve: The pulmonic valve was normal in structure. Pulmonic valve regurgitation is not visualized. No evidence of pulmonic stenosis. Aorta: The aortic root is normal in size and structure. There is mild (Grade II) atheroma plaque involving the descending aorta. Venous: The inferior vena cava is normal in size with greater than 50% respiratory variability, suggesting right atrial pressure of 3 mmHg. IAS/Shunts: No atrial level shunt detected by color flow Doppler.  MR Peak grad:    102.0 mmHg   TRICUSPID VALVE MR Mean grad:    72.0 mmHg    TR Peak grad:   50.4 mmHg MR Vmax:         505.00 cm/s  TR Vmax:        355.00 cm/s MR Vmean:        403.0  cm/s MR PISA:         4.02 cm MR PISA Eff ROA: 31 mm MR PISA Radius:  0.80 cm Skeet Latch MD Electronically signed by Skeet Latch MD Signature Date/Time: 05/02/2021/6:21:48 PM    Final     Cardiac Studies   Cath: 04/23/21     Prox LAD to Mid LAD lesion is 70% stenosed.   Prox LAD lesion is 50% stenosed.   Mid Cx lesion is 100% stenosed.   Dist Cx lesion is 80% stenosed.   3rd Mrg lesion is 100% stenosed.   Non-stenotic Dist RCA lesion was previously treated.   Non-stenotic Mid RCA to Dist RCA lesion was previously treated.   A drug-eluting stent was successfully placed.   Post intervention, there is a 0% residual stenosis.   Post intervention, there is a 0% residual stenosis.   There is moderate left ventricular systolic dysfunction.   Acute inferolateral wall ST segment elevation secondary to total occlusion of a large left circumflex coronary artery with TIMI 0 flow.   Calcification involving the proximal LAD with 50 and 70% stenoses.   Widely patent dominant RCA with previously placed 4 tandem stents proximally to distally without restenosis.   Moderate acute LV dysfunction with EF estimated at 35 to 40% with hypocontractility involving the mid distal anterolateral wall.  LVEDP 30 mm   Successful percutaneous coronary intervention to the left circumflex coronary artery with ultimate insertion of a 2.75 x 26 mm Medtronic Onyx frontiers stent postdilated to 3.04 mm with the 100% occlusion being reduced to 0% and brisk TIMI-3 flow in the major vessel.  There is residual thrombus and a small marginal branch arising from the stented segment which was  totally occluded initially.  There also was faint thrombus in a very distal branch of the circumflex.  The plan is to continue Aggrastat for 18 hours post procedure.   RECOMMENDATION: DAPT indefinitely.  We will continue Aggrastat 18 hours postinfusion.  Optimize blood pressure.  Plan 2D echo Doppler study.  Continue carvedilol,  consider ARB and transition to [facility] if LV function remains impaired.  Continue Praluent/Zetia with statin intolerance and probable familial hyperlipidemia.   Diagnostic Dominance: Right Intervention     Echo: 04/24/21   IMPRESSIONS     1. Septal , apical, lateral and posterior lateral hypokinesis . Left  ventricular ejection fraction, by estimation, is 30 to 35%. The left  ventricle has moderately decreased function. The left ventricle  demonstrates regional wall motion abnormalities  (see scoring diagram/findings for description). The left ventricular  internal cavity size was moderately dilated. There is mild left  ventricular hypertrophy. Left ventricular diastolic parameters are  indeterminate.   2. Right ventricular systolic function is normal. The right ventricular  size is normal. There is moderately elevated pulmonary artery systolic  pressure.   3. Left atrial size was mildly dilated.   4. The mitral valve is abnormal. Moderate to severe mitral valve  regurgitation. No evidence of mitral stenosis. Severe mitral annular  calcification.   5. Tricuspid valve regurgitation is moderate.   6. The aortic valve is normal in structure. There is moderate  calcification of the aortic valve. There is moderate thickening of the  aortic valve. Aortic valve regurgitation is mild to moderate. Mild aortic  valve stenosis.   7. The inferior vena cava is normal in size with greater than 50%  respiratory variability, suggesting right atrial pressure of 3 mmHg.   FINDINGS   Left Ventricle: Septal , apical, lateral and posterior lateral  hypokinesis. Left ventricular ejection fraction, by estimation, is 30 to  35%. The left ventricle has moderately decreased function. The left  ventricle demonstrates regional wall motion  abnormalities. The left ventricular internal cavity size was moderately  dilated. There is mild left ventricular hypertrophy. Left ventricular  diastolic parameters  are indeterminate.   Right Ventricle: The right ventricular size is normal. No increase in  right ventricular wall thickness. Right ventricular systolic function is  normal. There is moderately elevated pulmonary artery systolic pressure.  The tricuspid regurgitant velocity is  3.48 m/s, and with an assumed right atrial pressure of 8 mmHg, the  estimated right ventricular systolic pressure is 82.5 mmHg.   Left Atrium: Left atrial size was mildly dilated.   Right Atrium: Right atrial size was normal in size.   Pericardium: There is no evidence of pericardial effusion.   Mitral Valve: The mitral valve is abnormal. There is severe thickening of  the mitral valve leaflet(s). There is severe calcification of the mitral  valve leaflet(s). Severe mitral annular calcification. Moderate to severe  mitral valve regurgitation. No  evidence of mitral valve stenosis. MV peak gradient, 8.0 mmHg. The mean  mitral valve gradient is 3.0 mmHg.   Tricuspid Valve: The tricuspid valve is normal in structure. Tricuspid  valve regurgitation is moderate . No evidence of tricuspid stenosis.   Aortic Valve: The aortic valve is normal in structure. There is moderate  calcification of the aortic valve. There is moderate thickening of the  aortic valve. Aortic valve regurgitation is mild to moderate. Aortic  regurgitation PHT measures 345 msec. Mild   aortic stenosis is present. Aortic valve mean gradient measures 4.0 mmHg.  Aortic  valve peak gradient measures 7.6 mmHg. Aortic valve area, by VTI  measures 2.09 cm.   Pulmonic Valve: The pulmonic valve was normal in structure. Pulmonic valve  regurgitation is not visualized. No evidence of pulmonic stenosis.   Aorta: The aortic root is normal in size and structure.   Venous: The inferior vena cava is normal in size with greater than 50%  respiratory variability, suggesting right atrial pressure of 3 mmHg.   IAS/Shunts: No atrial level shunt detected by  color flow Doppler.    TEE: 05/02/21  IMPRESSIONS    1. Anterior, inferior, posterolateral, and anterolateral hypokinesis.  Left ventricular ejection fraction, by estimation, is 40 to 45%. The left  ventricle has mildly decreased function. The left ventricle has no  regional wall motion abnormalities.   2. Right ventricular systolic function is normal. The right ventricular  size is normal.   3. No left atrial/left atrial appendage thrombus was detected.   4. A small pericardial effusion is present.   5. Regurgitatnt volume 50 mL. No evidence of pulmonary vein flow  reversal. The mitral valve is normal in structure. Moderate mitral valve  regurgitation. No evidence of mitral stenosis.   6. Restricted motion of the left coronary cusp. The aortic valve is  tricuspid. There is mild calcification of the aortic valve. There is mild  thickening of the aortic valve. Aortic valve regurgitation is mild. No  aortic stenosis is present.   7. There is mild (Grade II) atheroma plaque involving the descending  aorta.   8. The inferior vena cava is normal in size with greater than 50%  respiratory variability, suggesting right atrial pressure of 3 mmHg.   Conclusion(s)/Recommendation(s): Normal biventricular function without  evidence of hemodynamically significant valvular heart disease.   FINDINGS   Left Ventricle: Anterior, inferior, posterolateral, and anterolateral  hypokinesis. Left ventricular ejection fraction, by estimation, is 40 to  45%. The left ventricle has mildly decreased function. The left ventricle  has no regional wall motion  abnormalities. The left ventricular internal cavity size was normal in  size. There is no left ventricular hypertrophy.   Right Ventricle: The right ventricular size is normal. No increase in  right ventricular wall thickness. Right ventricular systolic function is  normal.   Left Atrium: Left atrial size was normal in size. No left atrial/left   atrial appendage thrombus was detected.   Right Atrium: Right atrial size was normal in size.   Pericardium: A small pericardial effusion is present.   Mitral Valve: Regurgitatnt volume 50 mL. No evidence of pulmonary vein  flow reversal. The mitral valve is normal in structure. Mild mitral  annular calcification. Moderate mitral valve regurgitation. No evidence of  mitral valve stenosis.   Tricuspid Valve: The tricuspid valve is normal in structure. Tricuspid  valve regurgitation is mild . No evidence of tricuspid stenosis.   Aortic Valve: Restricted motion of the left coronary cusp. The aortic  valve is tricuspid. There is mild calcification of the aortic valve. There  is mild thickening of the aortic valve. Aortic valve regurgitation is  mild. No aortic stenosis is present.   Pulmonic Valve: The pulmonic valve was normal in structure. Pulmonic valve  regurgitation is not visualized. No evidence of pulmonic stenosis.   Aorta: The aortic root is normal in size and structure. There is mild  (Grade II) atheroma plaque involving the descending aorta.   Venous: The inferior vena cava is normal in size with greater than 50%  respiratory variability, suggesting  right atrial pressure of 3 mmHg.   IAS/Shunts: No atrial level shunt detected by color flow Doppler.   Patient Profile     81 y.o. female with PMH of CAD s/p DES to RCA in 2021, severe MAC, HTN, HLD, multinodular thyroid s/p thyroidectomy in 2019, anemia, GERD, known RBBB, DM2, OSA, carotid artery disease with 50-69% stenosis bilaterally, breast cancer, and lung cancer s/p LVATS 2019, negative myoview stress test in 2013 who was seen 04/23/2021 for the evaluation of STEMI.  Assessment & Plan    STEMI: underwent cardiac cath noted above with successful PCI/DES x1  of mLcx. Did have residual thrombus in small marginal branch from stented segment. Also with 50-70% pLAD stenosis to be treated medically. Treated with aggrastat for 18  hr post cath. hsTn >24000. Plan for DAPT with ASA/Brilnta for at least one year. Working with CR. ASA was held briefly as her Hgb dropped, but now resumed now with improvement. -- on ASA, Brilinta, statin, BB, Imdur    HFrEF/ICM: LVEF of 30-35%, with septal, apical, lateral and posterior lateral hypokinesis. Diuresed with IV lasix,  net - 3.5L, weight down 154.1>>149.5lbs -- GDMT limited in the setting of AKI and low blood pressures -- continue BB    Acute kidney injury: baseline Cr 1.3, up to 2.59 post cath with diuresis, stable at 2.38. Repeat labs pending this morning  -- follow BMET   Moderate to severe MR: MV peak gradient, 8.0 mmHg. The mean  mitral valve gradient is 3.0 mmHg.  -- TEE yesterday noted above with moderate/severe MR, MV regurg volume 25m -- will touch base with structural team to determine plan   HLD: LDL 58 -- on praluent PTA reports compliance   HTN: blood pressures have been borderline soft -- continue coreg 3.1214mBID   DM: Hgb A1c 6.1 -- on Trijardy PTA (empag-linaglip-metfor) -- SSI while inpatient   Anemia: steady decline since admission 10.1>>7.7. Improved at 9.0 -- ASA initially held with drop, now resumed  -- Stool cards pending   For questions or updates, please contact [facility] consult [email or web address] for contact info under     Signed, LiReino BellisNP  05/03/2021, 9:24 AM     Patient seen and examined. Agree with assessment and plan. TEE her demonstrated improvement in LV function following successful revascularization with EF now at 40 to 45%, improved from 30 to 35% admission.  Mitral valve regurgitant volume 50 mL.  There is no evidence for pulmonary vein reversal.  There is moderate mitral valve regurgitation without evidence for mitral valve stenosis . Creatinine continues to slowly improve, now 2.38.      ThTroy SineMD, FAWisconsin Surgery Center LLC/14/2023 11:41 AM

## 2021-05-04 ENCOUNTER — Encounter (HOSPITAL_COMMUNITY): Payer: Self-pay | Admitting: Cardiovascular Disease

## 2021-05-04 DIAGNOSIS — I2121 ST elevation (STEMI) myocardial infarction involving left circumflex coronary artery: Secondary | ICD-10-CM | POA: Diagnosis not present

## 2021-05-04 DIAGNOSIS — I340 Nonrheumatic mitral (valve) insufficiency: Secondary | ICD-10-CM | POA: Diagnosis not present

## 2021-05-04 DIAGNOSIS — N179 Acute kidney failure, unspecified: Secondary | ICD-10-CM | POA: Diagnosis not present

## 2021-05-04 LAB — CBC
HCT: 24.6 % — ABNORMAL LOW (ref 36.0–46.0)
Hemoglobin: 8 g/dL — ABNORMAL LOW (ref 12.0–15.0)
MCH: 27.8 pg (ref 26.0–34.0)
MCHC: 32.5 g/dL (ref 30.0–36.0)
MCV: 85.4 fL (ref 80.0–100.0)
Platelets: 343 10*3/uL (ref 150–400)
RBC: 2.88 MIL/uL — ABNORMAL LOW (ref 3.87–5.11)
RDW: 16.3 % — ABNORMAL HIGH (ref 11.5–15.5)
WBC: 7.3 10*3/uL (ref 4.0–10.5)
nRBC: 0 % (ref 0.0–0.2)

## 2021-05-04 LAB — GLUCOSE, CAPILLARY
Glucose-Capillary: 168 mg/dL — ABNORMAL HIGH (ref 70–99)
Glucose-Capillary: 173 mg/dL — ABNORMAL HIGH (ref 70–99)
Glucose-Capillary: 238 mg/dL — ABNORMAL HIGH (ref 70–99)
Glucose-Capillary: 151 mg/dL — ABNORMAL HIGH (ref 70–99)

## 2021-05-04 LAB — BASIC METABOLIC PANEL
Anion gap: 14 (ref 5–15)
BUN: 67 mg/dL — ABNORMAL HIGH (ref 8–23)
CO2: 21 mmol/L — ABNORMAL LOW (ref 22–32)
Calcium: 7.5 mg/dL — ABNORMAL LOW (ref 8.9–10.3)
Chloride: 101 mmol/L (ref 98–111)
Creatinine, Ser: 2.51 mg/dL — ABNORMAL HIGH (ref 0.44–1.00)
GFR, Estimated: 19 mL/min — ABNORMAL LOW (ref >60)
Glucose, Bld: 182 mg/dL — ABNORMAL HIGH (ref 70–99)
Potassium: 4 mmol/L (ref 3.5–5.1)
Sodium: 136 mmol/L (ref 135–145)

## 2021-05-04 MED ORDER — SODIUM CHLORIDE 0.9 % IV SOLN: INTRAVENOUS | Status: DC

## 2021-05-04 NOTE — Progress Notes (Signed)
Mobility Specialist Progress Note:   05/04/21 1000  Mobility  Activity Ambulated with assistance in hallway  Level of Assistance Minimal assist, patient does 75% or more  Assistive Device None  Distance Ambulated (ft) 240 ft  Activity Response Tolerated fair  Transport method Ambulatory    Pre Mobility: HR 93bpm; SpO2 99% During Mobility: SpO2 99% Post Mobility: HR 91bpm; SpO2 96%  Pt eager for OOB mobility this am. Displayed SOB upon standing, SpO2 99%. Educated pt on pursed lip breathing, with no symptom relief. Pt c/o difficulty to take deep breaths d/t CP. Would likely benefit from rollator for energy conservation as multiple standing rest breaks were taken in hallway. Pt left in chair in front of sink per NT request.   [place] Phone: [phone] Office Phone: [phone]

## 2021-05-04 NOTE — Plan of Care (Signed)
  Problem: Clinical Measurements: Goal: Will remain free from infection Outcome: Progressing Goal: Respiratory complications will improve Outcome: Progressing Goal: Cardiovascular complication will be avoided Outcome: Progressing   Problem: Activity: Goal: Risk for activity intolerance will decrease Outcome: Progressing   

## 2021-05-04 NOTE — Progress Notes (Signed)
CARDIAC REHAB PHASE I   PRE:  Rate/Rhythm: 86 SR    BP: sitting 102/53    SaO2: 96 RA  MODE:  Ambulation: 180 ft   POST:  Rate/Rhythm: 100 ST    BP: sitting 105/49     SaO2: 96 RA  Pt on EOB. To [facility] then ambulated hall with RW. Pt c/o chest tightness after 90 ft. Stopped and rested, leaning over. Pt ambulated back to room but tightness continued. Pt unsafe at times in room in her hurry to sit down. Upon sitting c/o 8/10 chest tightness. Encouraged deep breaths, relaxing. Applied 2L. CP down to 3/10 with rest and 2L. Left pt with feet elevated. VSS.  [id number]  Darrick Meigs CES, ACSM 05/04/2021 2:24 PM

## 2021-05-04 NOTE — Anesthesia Postprocedure Evaluation (Signed)
Anesthesia Post Note  Patient: Karen West  Procedure(s) Performed: TRANSESOPHAGEAL ECHOCARDIOGRAM (TEE)     Patient location during evaluation: PACU Anesthesia Type: MAC Level of consciousness: patient cooperative and awake Pain management: pain level controlled Vital Signs Assessment: post-procedure vital signs reviewed and stable Respiratory status: spontaneous breathing, nonlabored ventilation, respiratory function stable and patient connected to nasal cannula oxygen Cardiovascular status: stable and blood pressure returned to baseline Postop Assessment: no apparent nausea or vomiting Anesthetic complications: no   No notable events documented.  Last Vitals:  Vitals:   05/04/21 1633 05/04/21 2016  BP: 125/78 (!) 108/53  Pulse: 82 88  Resp: 20 18  Temp: (!) 36.3 C (!) 36.4 C  SpO2: 98% 95%    Last Pain:  Vitals:   05/04/21 2016  TempSrc: Oral  PainSc:                  Tiffini Blacksher

## 2021-05-04 NOTE — Progress Notes (Addendum)
Progress Note  Patient Name: Karen West Date of Encounter: 05/04/2021  [facility] Cardiologist: Minus Breeding, MD   Subjective   No chest pain, breathing is stable.   Inpatient Medications    Scheduled Meds:  aspirin EC  81 mg Oral Daily   carvedilol  3.125 mg Oral BID WC   enoxaparin (LOVENOX) injection  30 mg Subcutaneous Q24H   feeding supplement (GLUCERNA SHAKE)  237 mL Oral TID BM   ferrous sulfate  325 mg Oral BID WC   insulin aspart  0-15 Units Subcutaneous TID WC   isosorbide mononitrate  30 mg Oral Daily   levothyroxine  88 mcg Oral Q0600   pantoprazole  40 mg Oral Daily   sodium chloride flush  3 mL Intravenous Q12H   ticagrelor  90 mg Oral BID   Continuous Infusions:  sodium chloride Stopped (05/02/21 1428)   sodium chloride     PRN Meds: sodium chloride, acetaminophen, diazepam, labetalol, nitroGLYCERIN, sodium chloride flush   Vital Signs    Vitals:   05/04/21 0300 05/04/21 0319 05/04/21 0715 05/04/21 1045  BP:   120/69 (!) 110/59  Pulse:  86 95 88  Resp: _0 Temp:   97.6 F (36.4 C) 97.6 F (36.4 C)  TempSrc:   Oral Oral  SpO2:   95% 98%  Weight:      Height:        Intake/Output Summary (Last 24 hours) at 05/04/2021 1125 Last data filed at 05/04/2021 0900 Gross per 24 hour  Intake 238 ml  Output 950 ml  Net -712 ml   Last 3 Weights 05/01/2021 04/30/2021 04/26/2021  Weight (lbs) 149 lb 8 oz 151 lb 7.3 oz 148 lb 5.9 oz  Weight (kg) 67.813 kg 68.7 kg 67.3 kg      Telemetry    SR - Personally Reviewed  ECG    No new tracing.   Physical Exam   GEN: No acute distress.   Neck: No JVD Cardiac: RRR, + systolic murmur LLSB, no rubs, or gallops.  Respiratory: Clear to auscultation bilaterally. GI: Soft, nontender, non-distended  MS: No edema; No deformity. Neuro:  Nonfocal  Psych: Normal affect   Labs    High Sensitivity Troponin:   Recent Labs  Lab 04/23/21 1000 04/23/21 1222  TROPONINIHS 22,485* >24,000*      Chemistry Recent Labs  Lab 04/29/21 0146 04/30/21 0205 05/02/21 0211 05/03/21 0918 05/04/21 0646  NA 134*   < > 140 135 136  K 3.5   < > 3.6 3.6 4.0  CL 100   < > 104 101 101  CO2 20*   < > 19* 21* 21*  GLUCOSE 104*   < > 134* 260* 182*  BUN 80*   < > 73* 66* 67*  CREATININE 2.51*   < > 2.38* 2.37* 2.51*  CALCIUM 7.8*   < > 7.9* 7.7* 7.5*  MG 2.1  --   --   --   --   GFRNONAA 19*   < > 20* 20* 19*  ANIONGAP 14   < > 17* 13 14   < > = values in this interval not displayed.    Lipids  Recent Labs  Lab 04/28/21 0204  CHOL 123  TRIG 99  HDL 45  LDLCALC 58  CHOLHDL 2.7    Hematology Recent Labs  Lab 04/29/21 0146 05/01/21 1354 05/04/21 0646  WBC 8.0 8.6 7.3  RBC 2.93*   2.94* 3.31* 2.88*  HGB 7.7* 9.0* 8.0*  HCT 24.1* 28.2* 24.6*  MCV 82.3 85.2 85.4  MCH 26.3 27.2 27.8  MCHC 32.0 31.9 32.5  RDW 15.6* 15.9* 16.3*  PLT 264 342 343   Thyroid No results for input(s): TSH, FREET4 in the last 168 hours.  BNPNo results for input(s): BNP, PROBNP in the last 168 hours.  DDimer No results for input(s): DDIMER in the last 168 hours.   Radiology    ECHO TEE  Result Date: 05/02/2021    TRANSESOPHOGEAL ECHO REPORT   Patient Name:   Karen West Date of Exam: 05/02/2021 Medical Rec #:  [id number]        Height:       61.0 in Accession #:    [id number]       Weight:       149.5 lb Date of Birth:  [birth year]         BSA:          1.669 m Patient Age:    12 years         BP:           114/53 mmHg Patient Gender: F                HR:           75 bpm. Exam Location:  Inpatient Procedure: 3D Echo, Transesophageal Echo, Cardiac Doppler and Color Doppler Indications:     Mitral regurgitation  History:         Patient has prior history of Echocardiogram examinations, most                  recent 04/24/2021. Mitral Valve Disease, Arrythmias:RBBB; Risk                  Factors:Hypertension, Dyslipidemia and Diabetes.  Sonographer:     Clayton Lefort RDCS (AE) Referring Phys:  [id number] [facility]  [place] Diagnosing Phys: Skeet Latch MD PROCEDURE: After discussion of the risks and benefits of a TEE, an informed consent was obtained from the patient. The transesophogeal probe was passed without difficulty through the esophogus of the patient. Sedation performed by different physician. The patient was monitored while under deep sedation. Anesthestetic sedation was provided intravenously by Anesthesiology: 144.56m of Propofol. Image quality was adequate. The patient's vital signs; including heart rate, blood pressure, and oxygen saturation; remained stable throughout the procedure. The patient developed no complications during the procedure. IMPRESSIONS  1. Anterior, inferior, posterolateral, and anterolateral hypokinesis. Left ventricular ejection fraction, by estimation, is 40 to 45%. The left ventricle has mildly decreased function. The left ventricle has no regional wall motion abnormalities.  2. Right ventricular systolic function is normal. The right ventricular size is normal.  3. No left atrial/left atrial appendage thrombus was detected.  4. A small pericardial effusion is present.  5. Regurgitatnt volume 50 mL. No evidence of pulmonary vein flow reversal. The mitral valve is normal in structure. Moderate mitral valve regurgitation. No evidence of mitral stenosis.  6. Restricted motion of the left coronary cusp. The aortic valve is tricuspid. There is mild calcification of the aortic valve. There is mild thickening of the aortic valve. Aortic valve regurgitation is mild. No aortic stenosis is present.  7. There is mild (Grade II) atheroma plaque involving the descending aorta.  8. The inferior vena cava is normal in size with greater than 50% respiratory variability, suggesting right atrial pressure of 3 mmHg. Conclusion(s)/Recommendation(s): Normal biventricular function without evidence of hemodynamically significant  valvular heart disease. FINDINGS  Left Ventricle: Anterior, inferior,  posterolateral, and anterolateral hypokinesis. Left ventricular ejection fraction, by estimation, is 40 to 45%. The left ventricle has mildly decreased function. The left ventricle has no regional wall motion abnormalities. The left ventricular internal cavity size was normal in size. There is no left ventricular hypertrophy. Right Ventricle: The right ventricular size is normal. No increase in right ventricular wall thickness. Right ventricular systolic function is normal. Left Atrium: Left atrial size was normal in size. No left atrial/left atrial appendage thrombus was detected. Right Atrium: Right atrial size was normal in size. Pericardium: A small pericardial effusion is present. Mitral Valve: Regurgitatnt volume 50 mL. No evidence of pulmonary vein flow reversal. The mitral valve is normal in structure. Mild mitral annular calcification. Moderate mitral valve regurgitation. No evidence of mitral valve stenosis. Tricuspid Valve: The tricuspid valve is normal in structure. Tricuspid valve regurgitation is mild . No evidence of tricuspid stenosis. Aortic Valve: Restricted motion of the left coronary cusp. The aortic valve is tricuspid. There is mild calcification of the aortic valve. There is mild thickening of the aortic valve. Aortic valve regurgitation is mild. No aortic stenosis is present. Pulmonic Valve: The pulmonic valve was normal in structure. Pulmonic valve regurgitation is not visualized. No evidence of pulmonic stenosis. Aorta: The aortic root is normal in size and structure. There is mild (Grade II) atheroma plaque involving the descending aorta. Venous: The inferior vena cava is normal in size with greater than 50% respiratory variability, suggesting right atrial pressure of 3 mmHg. IAS/Shunts: No atrial level shunt detected by color flow Doppler.  MR Peak grad:    102.0 mmHg   TRICUSPID VALVE MR Mean grad:    72.0 mmHg    TR Peak grad:   50.4 mmHg MR Vmax:         505.00 cm/s  TR Vmax:         355.00 cm/s MR Vmean:        403.0 cm/s MR PISA:         4.02 cm MR PISA Eff ROA: 31 mm MR PISA Radius:  0.80 cm Skeet Latch MD Electronically signed by Skeet Latch MD Signature Date/Time: 05/02/2021/6:21:48 PM    Final     Cardiac Studies   Cath: 04/23/21     Prox LAD to Mid LAD lesion is 70% stenosed.   Prox LAD lesion is 50% stenosed.   Mid Cx lesion is 100% stenosed.   Dist Cx lesion is 80% stenosed.   3rd Mrg lesion is 100% stenosed.   Non-stenotic Dist RCA lesion was previously treated.   Non-stenotic Mid RCA to Dist RCA lesion was previously treated.   A drug-eluting stent was successfully placed.   Post intervention, there is a 0% residual stenosis.   Post intervention, there is a 0% residual stenosis.   There is moderate left ventricular systolic dysfunction.   Acute inferolateral wall ST segment elevation secondary to total occlusion of a large left circumflex coronary artery with TIMI 0 flow.   Calcification involving the proximal LAD with 50 and 70% stenoses.   Widely patent dominant RCA with previously placed 4 tandem stents proximally to distally without restenosis.   Moderate acute LV dysfunction with EF estimated at 35 to 40% with hypocontractility involving the mid distal anterolateral wall.  LVEDP 30 mm   Successful percutaneous coronary intervention to the left circumflex coronary artery with ultimate insertion of a 2.75 x 26 mm Medtronic Onyx frontiers stent  postdilated to 3.04 mm with the 100% occlusion being reduced to 0% and brisk TIMI-3 flow in the major vessel.  There is residual thrombus and a small marginal branch arising from the stented segment which was totally occluded initially.  There also was faint thrombus in a very distal branch of the circumflex.  The plan is to continue Aggrastat for 18 hours post procedure.   RECOMMENDATION: DAPT indefinitely.  We will continue Aggrastat 18 hours postinfusion.  Optimize blood pressure.  Plan 2D echo Doppler  study.  Continue carvedilol, consider ARB and transition to [facility] if LV function remains impaired.  Continue Praluent/Zetia with statin intolerance and probable familial hyperlipidemia.   Diagnostic Dominance: Right Intervention     Echo: 04/24/21   IMPRESSIONS     1. Septal , apical, lateral and posterior lateral hypokinesis . Left  ventricular ejection fraction, by estimation, is 30 to 35%. The left  ventricle has moderately decreased function. The left ventricle  demonstrates regional wall motion abnormalities  (see scoring diagram/findings for description). The left ventricular  internal cavity size was moderately dilated. There is mild left  ventricular hypertrophy. Left ventricular diastolic parameters are  indeterminate.   2. Right ventricular systolic function is normal. The right ventricular  size is normal. There is moderately elevated pulmonary artery systolic  pressure.   3. Left atrial size was mildly dilated.   4. The mitral valve is abnormal. Moderate to severe mitral valve  regurgitation. No evidence of mitral stenosis. Severe mitral annular  calcification.   5. Tricuspid valve regurgitation is moderate.   6. The aortic valve is normal in structure. There is moderate  calcification of the aortic valve. There is moderate thickening of the  aortic valve. Aortic valve regurgitation is mild to moderate. Mild aortic  valve stenosis.   7. The inferior vena cava is normal in size with greater than 50%  respiratory variability, suggesting right atrial pressure of 3 mmHg.   FINDINGS   Left Ventricle: Septal , apical, lateral and posterior lateral  hypokinesis. Left ventricular ejection fraction, by estimation, is 30 to  35%. The left ventricle has moderately decreased function. The left  ventricle demonstrates regional wall motion  abnormalities. The left ventricular internal cavity size was moderately  dilated. There is mild left ventricular hypertrophy. Left  ventricular  diastolic parameters are indeterminate.   Right Ventricle: The right ventricular size is normal. No increase in  right ventricular wall thickness. Right ventricular systolic function is  normal. There is moderately elevated pulmonary artery systolic pressure.  The tricuspid regurgitant velocity is  3.48 m/s, and with an assumed right atrial pressure of 8 mmHg, the  estimated right ventricular systolic pressure is 77.8 mmHg.   Left Atrium: Left atrial size was mildly dilated.   Right Atrium: Right atrial size was normal in size.   Pericardium: There is no evidence of pericardial effusion.   Mitral Valve: The mitral valve is abnormal. There is severe thickening of  the mitral valve leaflet(s). There is severe calcification of the mitral  valve leaflet(s). Severe mitral annular calcification. Moderate to severe  mitral valve regurgitation. No  evidence of mitral valve stenosis. MV peak gradient, 8.0 mmHg. The mean  mitral valve gradient is 3.0 mmHg.   Tricuspid Valve: The tricuspid valve is normal in structure. Tricuspid  valve regurgitation is moderate . No evidence of tricuspid stenosis.   Aortic Valve: The aortic valve is normal in structure. There is moderate  calcification of the aortic valve. There is  moderate thickening of the  aortic valve. Aortic valve regurgitation is mild to moderate. Aortic  regurgitation PHT measures 345 msec. Mild   aortic stenosis is present. Aortic valve mean gradient measures 4.0 mmHg.  Aortic valve peak gradient measures 7.6 mmHg. Aortic valve area, by VTI  measures 2.09 cm.   Pulmonic Valve: The pulmonic valve was normal in structure. Pulmonic valve  regurgitation is not visualized. No evidence of pulmonic stenosis.   Aorta: The aortic root is normal in size and structure.   Venous: The inferior vena cava is normal in size with greater than 50%  respiratory variability, suggesting right atrial pressure of 3 mmHg.   IAS/Shunts:  No atrial level shunt detected by color flow Doppler.      TEE: 05/02/21   IMPRESSIONS    1. Anterior, inferior, posterolateral, and anterolateral hypokinesis.  Left ventricular ejection fraction, by estimation, is 40 to 45%. The left  ventricle has mildly decreased function. The left ventricle has no  regional wall motion abnormalities.   2. Right ventricular systolic function is normal. The right ventricular  size is normal.   3. No left atrial/left atrial appendage thrombus was detected.   4. A small pericardial effusion is present.   5. Regurgitatnt volume 50 mL. No evidence of pulmonary vein flow  reversal. The mitral valve is normal in structure. Moderate mitral valve  regurgitation. No evidence of mitral stenosis.   6. Restricted motion of the left coronary cusp. The aortic valve is  tricuspid. There is mild calcification of the aortic valve. There is mild  thickening of the aortic valve. Aortic valve regurgitation is mild. No  aortic stenosis is present.   7. There is mild (Grade II) atheroma plaque involving the descending  aorta.   8. The inferior vena cava is normal in size with greater than 50%  respiratory variability, suggesting right atrial pressure of 3 mmHg.   Conclusion(s)/Recommendation(s): Normal biventricular function without  evidence of hemodynamically significant valvular heart disease.   FINDINGS   Left Ventricle: Anterior, inferior, posterolateral, and anterolateral  hypokinesis. Left ventricular ejection fraction, by estimation, is 40 to  45%. The left ventricle has mildly decreased function. The left ventricle  has no regional wall motion  abnormalities. The left ventricular internal cavity size was normal in  size. There is no left ventricular hypertrophy.   Right Ventricle: The right ventricular size is normal. No increase in  right ventricular wall thickness. Right ventricular systolic function is  normal.   Left Atrium: Left atrial size was  normal in size. No left atrial/left  atrial appendage thrombus was detected.   Right Atrium: Right atrial size was normal in size.   Pericardium: A small pericardial effusion is present.   Mitral Valve: Regurgitatnt volume 50 mL. No evidence of pulmonary vein  flow reversal. The mitral valve is normal in structure. Mild mitral  annular calcification. Moderate mitral valve regurgitation. No evidence of  mitral valve stenosis.   Tricuspid Valve: The tricuspid valve is normal in structure. Tricuspid  valve regurgitation is mild . No evidence of tricuspid stenosis.   Aortic Valve: Restricted motion of the left coronary cusp. The aortic  valve is tricuspid. There is mild calcification of the aortic valve. There  is mild thickening of the aortic valve. Aortic valve regurgitation is  mild. No aortic stenosis is present.   Pulmonic Valve: The pulmonic valve was normal in structure. Pulmonic valve  regurgitation is not visualized. No evidence of pulmonic stenosis.   Aorta:  The aortic root is normal in size and structure. There is mild  (Grade II) atheroma plaque involving the descending aorta.   Venous: The inferior vena cava is normal in size with greater than 50%  respiratory variability, suggesting right atrial pressure of 3 mmHg.   IAS/Shunts: No atrial level shunt detected by color flow Doppler.   Patient Profile     81 y.o. female with PMH of CAD s/p DES to RCA in 2021, severe MAC, HTN, HLD, multinodular thyroid s/p thyroidectomy in 2019, anemia, GERD, known RBBB, DM2, OSA, carotid artery disease with 50-69% stenosis bilaterally, breast cancer, and lung cancer s/p LVATS 2019, negative myoview stress test in 2013 who was seen 04/23/2021 for the evaluation of STEMI.  Assessment & Plan    STEMI: underwent cardiac cath noted above with successful PCI/DES x1  of mLcx. Did have residual thrombus in small marginal branch from stented segment. Also with 50-70% pLAD stenosis to be treated  medically. Treated with aggrastat for 18 hr post cath. hsTn >24000. Plan for DAPT with ASA/Brilnta for at least one year. Working with CR.  -- ASA was held briefly as her Hgb dropped to 7.7, but restarted with improved Hgb. -- on ASA, Brilinta, statin, BB, Imdur    HFrEF/ICM: LVEF of 30-35%, with septal, apical, lateral and posterior lateral hypokinesis. Diuresed with IV lasix,  net - 4.2L, weight down 154.1>>149.5lbs. No recent weight, will order for today -- TEE actually showed improved EF of 40-45% -- GDMT limited in the setting of AKI and low blood pressures -- continue BB    Acute kidney injury: baseline Cr 1.3, up to 2.59 post cath with diuresis. Improved to 2.3, but slight increase at 2.51 -- follow BMET   Moderate to severe MR: MV peak gradient, 8.0 mmHg. The mean  mitral valve gradient is 3.0 mmHg.  -- TEE yesterday noted above with moderate/severe MR, MV regurg volume 96m -- structural team plans to evaluate as an outpatient   HLD: LDL 58 -- on praluent PTA reports compliance   HTN: blood pressures have been borderline soft -- continue coreg 3.129mBID   DM: Hgb A1c 6.1 -- on Trijardy PTA (empag-linaglip-metfor) -- SSI while inpatient   Anemia: steady decline since admission 10.1>>7.7. Improved at 9.0, back down to 8 this morning -- ASA initially held with drop, now resumed. Will continue for now. If Hgb remains lower, may need to switch to plavix -- Stool cards pending   PT/OT evaluate     For questions or updates, please contact [facility] Please consult [email or web address] for contact info under        Signed, LiReino BellisNP  05/04/2021, 11:25 AM     Patient seen and examined. Agree with assessment and plan.  No recurrent chest pain or significant shortness of breath.  Creatinine is further increased today now again at 2.51 after decreasing to 2.37.  Will initiate low-dose hydration with normal saline at 40 cc an hour for 1 L. Hemoglobin continues to  decrease now at 8.  Patient states she has had several endoscopies over the past several years.  We will repeat CBC in a.m. if further decreases may need packed red blood cell transfusion and follow-up GI evaluation.  Stool cards pending   ThTroy SineMD, [facility]/15/2023 1:55 PM

## 2021-05-05 ENCOUNTER — Telehealth: Payer: Self-pay

## 2021-05-05 DIAGNOSIS — I2121 ST elevation (STEMI) myocardial infarction involving left circumflex coronary artery: Secondary | ICD-10-CM | POA: Diagnosis not present

## 2021-05-05 DIAGNOSIS — N179 Acute kidney failure, unspecified: Secondary | ICD-10-CM | POA: Diagnosis not present

## 2021-05-05 DIAGNOSIS — I255 Ischemic cardiomyopathy: Secondary | ICD-10-CM | POA: Diagnosis not present

## 2021-05-05 DIAGNOSIS — I340 Nonrheumatic mitral (valve) insufficiency: Secondary | ICD-10-CM | POA: Diagnosis not present

## 2021-05-05 LAB — CBC WITH DIFFERENTIAL/PLATELET
Abs Immature Granulocytes: 0.15 10*3/uL — ABNORMAL HIGH (ref 0.00–0.07)
Basophils Absolute: 0 10*3/uL (ref 0.0–0.1)
Basophils Relative: 0 %
Eosinophils Absolute: 0.2 10*3/uL (ref 0.0–0.5)
Eosinophils Relative: 2 %
HCT: 25.4 % — ABNORMAL LOW (ref 36.0–46.0)
Hemoglobin: 7.9 g/dL — ABNORMAL LOW (ref 12.0–15.0)
Immature Granulocytes: 2 %
Lymphocytes Relative: 13 %
Lymphs Abs: 1 10*3/uL (ref 0.7–4.0)
MCH: 26.7 pg (ref 26.0–34.0)
MCHC: 31.1 g/dL (ref 30.0–36.0)
MCV: 85.8 fL (ref 80.0–100.0)
Monocytes Absolute: 0.5 10*3/uL (ref 0.1–1.0)
Monocytes Relative: 6 %
Neutro Abs: 6.2 10*3/uL (ref 1.7–7.7)
Neutrophils Relative %: 77 %
Platelets: 362 10*3/uL (ref 150–400)
RBC: 2.96 MIL/uL — ABNORMAL LOW (ref 3.87–5.11)
RDW: 16.9 % — ABNORMAL HIGH (ref 11.5–15.5)
WBC: 8 10*3/uL (ref 4.0–10.5)
nRBC: 0.5 % — ABNORMAL HIGH (ref 0.0–0.2)

## 2021-05-05 LAB — COMPREHENSIVE METABOLIC PANEL
ALT: 20 U/L (ref 0–44)
AST: 30 U/L (ref 15–41)
Albumin: 2.7 g/dL — ABNORMAL LOW (ref 3.5–5.0)
Alkaline Phosphatase: 67 U/L (ref 38–126)
Anion gap: 15 (ref 5–15)
BUN: 65 mg/dL — ABNORMAL HIGH (ref 8–23)
CO2: 20 mmol/L — ABNORMAL LOW (ref 22–32)
Calcium: 7.7 mg/dL — ABNORMAL LOW (ref 8.9–10.3)
Chloride: 99 mmol/L (ref 98–111)
Creatinine, Ser: 2.32 mg/dL — ABNORMAL HIGH (ref 0.44–1.00)
GFR, Estimated: 21 mL/min — ABNORMAL LOW (ref >60)
Glucose, Bld: 200 mg/dL — ABNORMAL HIGH (ref 70–99)
Potassium: 3.9 mmol/L (ref 3.5–5.1)
Sodium: 134 mmol/L — ABNORMAL LOW (ref 135–145)
Total Bilirubin: 0.4 mg/dL (ref 0.3–1.2)
Total Protein: 6.5 g/dL (ref 6.5–8.1)

## 2021-05-05 LAB — GLUCOSE, CAPILLARY
Glucose-Capillary: 183 mg/dL — ABNORMAL HIGH (ref 70–99)
Glucose-Capillary: 157 mg/dL — ABNORMAL HIGH (ref 70–99)
Glucose-Capillary: 237 mg/dL — ABNORMAL HIGH (ref 70–99)
Glucose-Capillary: 190 mg/dL — ABNORMAL HIGH (ref 70–99)

## 2021-05-05 LAB — PREPARE RBC (CROSSMATCH)

## 2021-05-05 LAB — HEMOGLOBIN AND HEMATOCRIT, BLOOD
HCT: 26.8 % — ABNORMAL LOW (ref 36.0–46.0)
Hemoglobin: 8.8 g/dL — ABNORMAL LOW (ref 12.0–15.0)

## 2021-05-05 MED ORDER — SODIUM CHLORIDE 0.9% IV SOLUTION: Freq: Once | INTRAVENOUS | Status: AC

## 2021-05-05 NOTE — Progress Notes (Signed)
Mobility Specialist Progress Note   05/05/21 1527  Mobility  Activity Refused mobility   Pt inappropriate for mobility specialist at this time given advisement by RN. Mobility specialist to hold today, will continue to follow for readiness.   Holland Falling Mobility Specialist Phone Number [phone]

## 2021-05-05 NOTE — Evaluation (Signed)
Physical Therapy Evaluation Patient Details Name: Karen West MRN: [id number] DOB: October 28, 1940 Today's Date: 05/05/2021  History of Present Illness  Karen West is a 81 y.o. female admitted 04/23/21 with STEMI. Coronary/Graft Acute MI Revascularization (04/23/2021); LEFT HEART CATH AND CORONARY ANGIOGRAPHY (04/23/2021); and TEE without cardioversion (05/02/2021) PMH includes CAD s/p DES to RCA in 2021, severe MAC, HTN, HLD, multinodular thyroid s/p thyroidectomy in 2019, anemia, GERD, known RBBB, DM2 (uncontrolled), OSA, carotid artery disease with 50-69% stenosis bilaterally, breast cancer, and lung cancer s/p LVATS 2019  Clinical Impression  Pt presents to PT with steady gait but with decr functional activity tolerance. Instructed pt to pace herself on return home with activities. Pt reports she already takes frequent rest breaks at home. Recommend HHPT to assess home activities after dc.   Will defer further mobility here to cardiac rehab and mobility speciallists.      Recommendations for follow up therapy are one component of a multi-disciplinary discharge planning process, led by the attending physician.  Recommendations may be updated based on patient status, additional functional criteria and insurance authorization.  Follow Up Recommendations Home health PT    Assistance Recommended at Discharge Intermittent Supervision/Assistance  Patient can return home with the following       Equipment Recommendations None recommended by PT  Recommendations for Other Services       Functional Status Assessment Patient has had a recent decline in their functional status and/or demonstrates limited ability to make significant improvements in function in a reasonable and predictable amount of time     Precautions / Restrictions Precautions Precautions: Fall Precaution Comments: watch O2 Restrictions Weight Bearing Restrictions: No      Mobility  Bed Mobility Overal bed mobility: Modified  Independent                  Transfers Overall transfer level: Needs assistance Equipment used: Rolling walker (2 wheels) Transfers: Sit to/from Stand Sit to Stand: Supervision           General transfer comment: Stood from chair and bed without phyiscal assist    Ambulation/Gait Ambulation/Gait assistance: Supervision Gait Distance (Feet): 200 Feet Assistive device: Rolling walker (2 wheels) Gait Pattern/deviations: Step-through pattern, Decreased stride length Gait velocity: decr Gait velocity interpretation: <1.31 ft/sec, indicative of household ambulator   General Gait Details: Steady gait with pt taking 4-5 standing rest breaks leaning forward onto walker  Stairs Stairs: Yes Stairs assistance: Min guard Stair Management: Step to pattern, Forwards, Backwards Number of Stairs: 1 General stair comments: Used portable step and hand held assist  Wheelchair Mobility    Modified Rankin (Stroke Patients Only)       Balance Overall balance assessment: Mild deficits observed, not formally tested                                           Pertinent Vitals/Pain Pain Assessment Pain Assessment: Faces Faces Pain Scale: Hurts a little bit Pain Location: Abdomen from getting all of those shots Pain Descriptors / Indicators: Moaning Pain Intervention(s): Monitored during session    Home Living Family/patient expects to be discharged to:: Private residence Living Arrangements: Alone Available Help at Discharge: Family;Available PRN/intermittently Type of Home: House Home Access: Stairs to enter   [employer] of Steps: 1 (up back by where she parks car)   Home Layout: One level Home Equipment: Conservation officer, nature (2  wheels);Shower seat - built in °Additional Comments: drives  °  °Prior Function Prior Level of Function : Independent/Modified Independent;Driving °  °  °  °  °  °  °Mobility Comments: reports not using RW, but has one °ADLs  Comments: uses shower chair, paces herself fir IADL °  ° ° °Hand Dominance  ° Dominant Hand: Right ° °  °Extremity/Trunk Assessment  ° Upper Extremity Assessment °Upper Extremity Assessment: Defer to OT evaluation °  ° °Lower Extremity Assessment °Lower Extremity Assessment: Generalized weakness °  ° °Cervical / Trunk Assessment °Cervical / Trunk Assessment: Kyphotic  °Communication  ° Communication: No difficulties  °Cognition Arousal/Alertness: Awake/alert °Behavior During Therapy: Anxious °Overall Cognitive Status: No family/caregiver present to determine baseline cognitive functioning °  °  °  °  °  °  °  °  °  °  °  °  °  °  °  °  °  °  °  ° °  °General Comments General comments (skin integrity, edema, etc.): Pt DOE with activity, SpO2 WFL on RA ° °  °Exercises    ° °Assessment/Plan  °  °PT Assessment All further PT needs can be met in the next venue of care  °PT Problem List Decreased strength;Decreased mobility;Decreased activity tolerance ° °   °  °PT Treatment Interventions     ° °PT Goals (Current goals can be found in the Care Plan section)  °Acute Rehab PT Goals °PT Goal Formulation: All assessment and education complete, DC therapy ° °  °Frequency   °  ° ° °Co-evaluation   °  °  °  °  ° ° °  °AM-PAC PT "6 Clicks" Mobility  °Outcome Measure Help needed turning from your back to your side while in a flat bed without using bedrails?: None °Help needed moving from lying on your back to sitting on the side of a flat bed without using bedrails?: None °Help needed moving to and from a bed to a chair (including a wheelchair)?: A Little °Help needed standing up from a chair using your arms (e.g., wheelchair or bedside chair)?: A Little °Help needed to walk in hospital room?: A Little °Help needed climbing 3-5 steps with a railing? : A Little °6 Click Score: 20 ° °  °End of Session   °Activity Tolerance: Patient tolerated treatment well (with frequent rest breaks) °Patient left: in bed;with call bell/phone within  reach °  °PT Visit Diagnosis: Muscle weakness (generalized) (M62.81) °  ° °Time: 1218-1232 °PT Time Calculation (min) (ACUTE ONLY): 14 min ° ° °Charges:   PT Evaluation °$PT Eval Moderate Complexity: 1 Mod °  °  °   ° ° °  PT °Acute Rehabilitation Services °Pager [phone] °Office [phone] ° ° ° [facility] °05/05/2021, 1:25 PM ° °

## 2021-05-05 NOTE — Care Management Important Message (Signed)
Important Message  Patient Details  Name: Karen West MRN: [id number] Date of Birth: Jan 27, 1941   Medicare Important Message Given:  Yes     [name] 05/05/2021, 9:50 AM

## 2021-05-05 NOTE — Telephone Encounter (Signed)
[facility] review of 05/02/2021 TEE: "For patient AW: This is secondary MR  This may be a clippable valve. The fossa looks approachable for transseptal puncture in of the Bicaval and SAXB views.  LA dimensions are large enough for straddle and steering of device.. The MR is caused by a lack of coaptation. The posterior leaflet measures 0.674cm in the LVOT grasping view. MVA is 3.03cm(Small), unsure of gradient(need info).  Based on this information, I'd recommend starting with an NT/W placed at A2/P2 and assessing for gradient.  *TR noted *LARGE piece of calcium under posterior leaflet. Questionable posterior leaflet quality and graspability."

## 2021-05-05 NOTE — Evaluation (Signed)
Occupational Therapy Evaluation Patient Details Name: Karen West MRN: [id number] DOB: 08-02-1940 Today's Date: 05/05/2021   History of Present Illness Karen West is a 81 y.o. female admitted 04/23/21 with STEMI. Coronary/Graft Acute MI Revascularization (04/23/2021); LEFT HEART CATH AND CORONARY ANGIOGRAPHY (04/23/2021); and TEE without cardioversion (05/02/2021) PMH includes CAD s/p DES to RCA in 2021, severe MAC, HTN, HLD, multinodular thyroid s/p thyroidectomy in 2019, anemia, GERD, known RBBB, DM2 (uncontrolled), OSA, carotid artery disease with 50-69% stenosis bilaterally, breast cancer, and lung cancer s/p LVATS 2019   Clinical Impression   Pt is typically mod I for mobility and ADL, paces herself at baseline for IADL, utilizes shower chair for bathing, has a RW for PRN instability with ambulation. Today Pt is DOE 2/4 but self-regulates taking frequent seated rest breaks. She was able to demonstrate LB and UB dressing, toilet transfer, sink level grooming at min guard/min guard A. Pt SpO2 >  90% on RA. At this time recommending [facility] post-acute to maximize safety and independence in ADL and functional transfers. OT will continue to follow acutely, next session take energy conservation and plan on OOB activity for ADL      Recommendations for follow up therapy are one component of a multi-disciplinary discharge planning process, led by the attending physician.  Recommendations may be updated based on patient status, additional functional criteria and insurance authorization.   Follow Up Recommendations  Home health OT    Assistance Recommended at Discharge Intermittent Supervision/Assistance  Patient can return home with the following A little help with walking and/or transfers;A little help with bathing/dressing/bathroom;Assistance with cooking/housework;Assist for transportation;Help with stairs or ramp for entrance    Functional Status Assessment  Patient has had a recent decline in  their functional status and demonstrates the ability to make significant improvements in function in a reasonable and predictable amount of time.  Equipment Recommendations  BSC/3in1    Recommendations for Other Services PT consult     Precautions / Restrictions Precautions Precautions: Fall Precaution Comments: watch O2 Restrictions Weight Bearing Restrictions: No      Mobility Bed Mobility Overal bed mobility: Modified Independent                  Transfers Overall transfer level: Needs assistance Equipment used: Rolling walker (2 wheels) Transfers: Sit to/from Stand Sit to Stand: Min assist, From elevated surface           General transfer comment: balance and boost from lower toilet      Balance Overall balance assessment: Mild deficits observed, not formally tested                                         ADL either performed or assessed with clinical judgement   ADL Overall ADL's : Needs assistance/impaired Eating/Feeding: Modified independent;Sitting Eating/Feeding Details (indicate cue type and reason): sitting EOB and eating when OT entered the room Grooming: Wash/dry hands;Min guard;Standing Grooming Details (indicate cue type and reason): fatigues quickly Upper Body Bathing: Set up;Sitting Upper Body Bathing Details (indicate cue type and reason): sits at baseline Lower Body Bathing: Min guard;Sitting/lateral leans   Upper Body Dressing : Set up;Sitting Upper Body Dressing Details (indicate cue type and reason): donning extra gown as robe Lower Body Dressing: Set up;Sitting/lateral leans   Toilet Transfer: Min guard;Ambulation;Rolling walker (2 wheels)   Toileting- Clothing Manipulation and Hygiene: Set up;Sitting/lateral lean  Functional mobility during ADLs: Min guard;Rolling walker (2 wheels) General ADL Comments: decreased activity tolerance, DOE     Vision Baseline Vision/History: 1 Wears glasses Ability to  See in Adequate Light: 0 Adequate Patient Visual Report: No change from baseline Vision Assessment?: No apparent visual deficits     Perception     Praxis      Pertinent Vitals/Pain Pain Assessment Pain Assessment: Faces Faces Pain Scale: Hurts a little bit Pain Location: generalized Pain Descriptors / Indicators: Grimacing, Moaning Pain Intervention(s): Monitored during session     Hand Dominance Right   Extremity/Trunk Assessment Upper Extremity Assessment Upper Extremity Assessment: Overall WFL for tasks assessed;Generalized weakness   Lower Extremity Assessment Lower Extremity Assessment: Defer to PT evaluation   Cervical / Trunk Assessment Cervical / Trunk Assessment: Kyphotic   Communication Communication Communication: No difficulties   Cognition Arousal/Alertness: Awake/alert Behavior During Therapy: Anxious Overall Cognitive Status: No family/caregiver present to determine baseline cognitive functioning                                       General Comments  Pt DOE with activity, SpO2 WFL on RA    Exercises     Shoulder Instructions      Home Living Family/patient expects to be discharged to:: Private residence Living Arrangements: Alone Available Help at Discharge: Family;Available PRN/intermittently Type of Home: House Home Access: Stairs to enter [employer] of Steps: 1 (up back by where she parks car)   Home Layout: One level     Bathroom Shower/Tub: Occupational psychologist: Handicapped height Bathroom Accessibility: Yes How Accessible: Accessible via walker Home Equipment: Conservation officer, nature (2 wheels);Shower seat - built in   Additional Comments: drives      Prior Functioning/Environment Prior Level of Function : Independent/Modified Independent;Driving             Mobility Comments: reports not using RW, but has one ADLs Comments: uses shower chair, paces herself fir IADL        OT Problem  List: Decreased activity tolerance;Impaired balance (sitting and/or standing);Decreased safety awareness;Cardiopulmonary status limiting activity      OT Treatment/Interventions: Self-care/ADL training;Therapeutic exercise;Energy conservation;DME and/or AE instruction;Therapeutic activities;Patient/family education;Balance training    OT Goals(Current goals can be found in the care plan section) Acute Rehab OT Goals Patient Stated Goal: feel better OT Goal Formulation: With patient Time For Goal Achievement: 05/19/21 Potential to Achieve Goals: Good  OT Frequency: Min 2X/week    Co-evaluation              AM-PAC OT "6 Clicks" Daily Activity     Outcome Measure Help from another person eating meals?: None Help from another person taking care of personal grooming?: A Little Help from another person toileting, which includes using toliet, bedpan, or urinal?: A Little Help from another person bathing (including washing, rinsing, drying)?: A Little Help from another person to put on and taking off regular upper body clothing?: None Help from another person to put on and taking off regular lower body clothing?: A Little 6 Click Score: 20   End of Session Equipment Utilized During Treatment: Rolling walker (2 wheels) Nurse Communication: Mobility status  Activity Tolerance: Patient tolerated treatment well Patient left: Other (comment) (walking with PT in the hall)  OT Visit Diagnosis: Other abnormalities of gait and mobility (R26.89);Muscle weakness (generalized) (M62.81)  Time: 1201-1218 OT Time Calculation (min): 17 min Charges:  OT General Charges $OT Visit: 1 Visit OT Evaluation $OT Eval Moderate Complexity: Hughes OTR/L Acute Rehabilitation Services Pager: [phone] Office: [place] 05/05/2021, 12:57 PM

## 2021-05-05 NOTE — Progress Notes (Addendum)
Progress Note  Patient Name: Karen West Date of Encounter: 05/05/2021  [facility] Cardiologist: Minus Breeding, MD   Subjective   No specific complaints. Feels weak, breathing is at her baseline. Soreness in her belly from lovenox injections  Inpatient Medications    Scheduled Meds:  aspirin EC  81 mg Oral Daily   carvedilol  3.125 mg Oral BID WC   enoxaparin (LOVENOX) injection  30 mg Subcutaneous Q24H   feeding supplement (GLUCERNA SHAKE)  237 mL Oral TID BM   ferrous sulfate  325 mg Oral BID WC   insulin aspart  0-15 Units Subcutaneous TID WC   isosorbide mononitrate  30 mg Oral Daily   levothyroxine  88 mcg Oral Q0600   pantoprazole  40 mg Oral Daily   sodium chloride flush  3 mL Intravenous Q12H   ticagrelor  90 mg Oral BID   Continuous Infusions:  sodium chloride Stopped (05/02/21 1428)   sodium chloride     sodium chloride 40 mL/hr at 05/04/21 1656   PRN Meds: sodium chloride, acetaminophen, diazepam, labetalol, nitroGLYCERIN, sodium chloride flush   Vital Signs    Vitals:   05/04/21 2313 05/05/21 0514 05/05/21 0532 05/05/21 0840  BP: 123/72 113/73  120/69  Pulse: 93 89  90  Resp: _0 Temp: 98.2 F (36.8 C) (!) 97.5 F (36.4 C)  (!) 96.6 F (35.9 C)  TempSrc: Oral Oral  Axillary  SpO2: 97% 99%  96%  Weight:   67.8 kg   Height:        Intake/Output Summary (Last 24 hours) at 05/05/2021 1050 Last data filed at 05/05/2021 0300 Gross per 24 hour  Intake 406.04 ml  Output 600 ml  Net -193.96 ml   Last 3 Weights 05/05/2021 05/04/2021 05/01/2021  Weight (lbs) 149 lb 7.6 oz 149 lb 11.1 oz 149 lb 8 oz  Weight (kg) 67.8 kg 67.9 kg 67.813 kg      Telemetry    SR - Personally Reviewed  ECG    No new tracing  Physical Exam   GEN: No acute distress.   Neck: No JVD Cardiac: RRR, no murmurs, rubs, or gallops.  Respiratory: Clear to auscultation bilaterally. GI: Soft, nontender, non-distended  MS: No edema; No deformity. Neuro:   Nonfocal  Psych: Normal affect   Labs    High Sensitivity Troponin:   Recent Labs  Lab 04/23/21 1000 04/23/21 1222  TROPONINIHS 22,485* >24,000*     Chemistry Recent Labs  Lab 04/29/21 0146 04/30/21 0205 05/03/21 0918 05/04/21 0646 05/05/21 0204  NA 134*   < > 135 136 134*  K 3.5   < > 3.6 4.0 3.9  CL 100   < > 101 101 99  CO2 20*   < > 21* 21* 20*  GLUCOSE 104*   < > 260* 182* 200*  BUN 80*   < > 66* 67* 65*  CREATININE 2.51*   < > 2.37* 2.51* 2.32*  CALCIUM 7.8*   < > 7.7* 7.5* 7.7*  MG 2.1  --   --   --   --   PROT  --   --   --   --  6.5  ALBUMIN  --   --   --   --  2.7*  AST  --   --   --   --  30  ALT  --   --   --   --  20  ALKPHOS  --   --   --   --  67  BILITOT  --   --   --   --  0.4  GFRNONAA 19*   < > 20* 19* 21*  ANIONGAP 14   < > _0 < > = values in this interval not displayed.    Lipids No results for input(s): CHOL, TRIG, HDL, LABVLDL, LDLCALC, CHOLHDL in the last 168 hours.  Hematology Recent Labs  Lab 05/01/21 1354 05/04/21 0646 05/05/21 0204  WBC 8.6 7.3 8.0  RBC 3.31* 2.88* 2.96*  HGB 9.0* 8.0* 7.9*  HCT 28.2* 24.6* 25.4*  MCV 85.2 85.4 85.8  MCH 27.2 27.8 26.7  MCHC 31.9 32.5 31.1  RDW 15.9* 16.3* 16.9*  PLT 342 343 362   Thyroid No results for input(s): TSH, FREET4 in the last 168 hours.  BNPNo results for input(s): BNP, PROBNP in the last 168 hours.  DDimer No results for input(s): DDIMER in the last 168 hours.   Radiology    No results found.  Cardiac Studies   Cath: 04/23/21     Prox LAD to Mid LAD lesion is 70% stenosed.   Prox LAD lesion is 50% stenosed.   Mid Cx lesion is 100% stenosed.   Dist Cx lesion is 80% stenosed.   3rd Mrg lesion is 100% stenosed.   Non-stenotic Dist RCA lesion was previously treated.   Non-stenotic Mid RCA to Dist RCA lesion was previously treated.   A drug-eluting stent was successfully placed.   Post intervention, there is a 0% residual stenosis.   Post intervention, there is a 0%  residual stenosis.   There is moderate left ventricular systolic dysfunction.   Acute inferolateral wall ST segment elevation secondary to total occlusion of a large left circumflex coronary artery with TIMI 0 flow.   Calcification involving the proximal LAD with 50 and 70% stenoses.   Widely patent dominant RCA with previously placed 4 tandem stents proximally to distally without restenosis.   Moderate acute LV dysfunction with EF estimated at 35 to 40% with hypocontractility involving the mid distal anterolateral wall.  LVEDP 30 mm   Successful percutaneous coronary intervention to the left circumflex coronary artery with ultimate insertion of a 2.75 x 26 mm Medtronic Onyx frontiers stent postdilated to 3.04 mm with the 100% occlusion being reduced to 0% and brisk TIMI-3 flow in the major vessel.  There is residual thrombus and a small marginal branch arising from the stented segment which was totally occluded initially.  There also was faint thrombus in a very distal branch of the circumflex.  The plan is to continue Aggrastat for 18 hours post procedure.   RECOMMENDATION: DAPT indefinitely.  We will continue Aggrastat 18 hours postinfusion.  Optimize blood pressure.  Plan 2D echo Doppler study.  Continue carvedilol, consider ARB and transition to [facility] if LV function remains impaired.  Continue Praluent/Zetia with statin intolerance and probable familial hyperlipidemia.   Diagnostic Dominance: Right Intervention     Echo: 04/24/21   IMPRESSIONS     1. Septal , apical, lateral and posterior lateral hypokinesis . Left  ventricular ejection fraction, by estimation, is 30 to 35%. The left  ventricle has moderately decreased function. The left ventricle  demonstrates regional wall motion abnormalities  (see scoring diagram/findings for description). The left ventricular  internal cavity size was moderately dilated. There is mild left  ventricular hypertrophy. Left ventricular  diastolic parameters are  indeterminate.   2. Right ventricular systolic function is normal. The right ventricular  size is normal.  There is moderately elevated pulmonary artery systolic  pressure.   3. Left atrial size was mildly dilated.   4. The mitral valve is abnormal. Moderate to severe mitral valve  regurgitation. No evidence of mitral stenosis. Severe mitral annular  calcification.   5. Tricuspid valve regurgitation is moderate.   6. The aortic valve is normal in structure. There is moderate  calcification of the aortic valve. There is moderate thickening of the  aortic valve. Aortic valve regurgitation is mild to moderate. Mild aortic  valve stenosis.   7. The inferior vena cava is normal in size with greater than 50%  respiratory variability, suggesting right atrial pressure of 3 mmHg.   FINDINGS   Left Ventricle: Septal , apical, lateral and posterior lateral  hypokinesis. Left ventricular ejection fraction, by estimation, is 30 to  35%. The left ventricle has moderately decreased function. The left  ventricle demonstrates regional wall motion  abnormalities. The left ventricular internal cavity size was moderately  dilated. There is mild left ventricular hypertrophy. Left ventricular  diastolic parameters are indeterminate.   Right Ventricle: The right ventricular size is normal. No increase in  right ventricular wall thickness. Right ventricular systolic function is  normal. There is moderately elevated pulmonary artery systolic pressure.  The tricuspid regurgitant velocity is  3.48 m/s, and with an assumed right atrial pressure of 8 mmHg, the  estimated right ventricular systolic pressure is 16.1 mmHg.   Left Atrium: Left atrial size was mildly dilated.   Right Atrium: Right atrial size was normal in size.   Pericardium: There is no evidence of pericardial effusion.   Mitral Valve: The mitral valve is abnormal. There is severe thickening of  the mitral valve  leaflet(s). There is severe calcification of the mitral  valve leaflet(s). Severe mitral annular calcification. Moderate to severe  mitral valve regurgitation. No  evidence of mitral valve stenosis. MV peak gradient, 8.0 mmHg. The mean  mitral valve gradient is 3.0 mmHg.   Tricuspid Valve: The tricuspid valve is normal in structure. Tricuspid  valve regurgitation is moderate . No evidence of tricuspid stenosis.   Aortic Valve: The aortic valve is normal in structure. There is moderate  calcification of the aortic valve. There is moderate thickening of the  aortic valve. Aortic valve regurgitation is mild to moderate. Aortic  regurgitation PHT measures 345 msec. Mild   aortic stenosis is present. Aortic valve mean gradient measures 4.0 mmHg.  Aortic valve peak gradient measures 7.6 mmHg. Aortic valve area, by VTI  measures 2.09 cm.   Pulmonic Valve: The pulmonic valve was normal in structure. Pulmonic valve  regurgitation is not visualized. No evidence of pulmonic stenosis.   Aorta: The aortic root is normal in size and structure.   Venous: The inferior vena cava is normal in size with greater than 50%  respiratory variability, suggesting right atrial pressure of 3 mmHg.   IAS/Shunts: No atrial level shunt detected by color flow Doppler.      TEE: 05/02/21   IMPRESSIONS    1. Anterior, inferior, posterolateral, and anterolateral hypokinesis.  Left ventricular ejection fraction, by estimation, is 40 to 45%. The left  ventricle has mildly decreased function. The left ventricle has no  regional wall motion abnormalities.   2. Right ventricular systolic function is normal. The right ventricular  size is normal.   3. No left atrial/left atrial appendage thrombus was detected.   4. A small pericardial effusion is present.   5. Regurgitatnt volume 50 mL. No  evidence of pulmonary vein flow  reversal. The mitral valve is normal in structure. Moderate mitral valve  regurgitation. No  evidence of mitral stenosis.   6. Restricted motion of the left coronary cusp. The aortic valve is  tricuspid. There is mild calcification of the aortic valve. There is mild  thickening of the aortic valve. Aortic valve regurgitation is mild. No  aortic stenosis is present.   7. There is mild (Grade II) atheroma plaque involving the descending  aorta.   8. The inferior vena cava is normal in size with greater than 50%  respiratory variability, suggesting right atrial pressure of 3 mmHg.   Conclusion(s)/Recommendation(s): Normal biventricular function without  evidence of hemodynamically significant valvular heart disease.   FINDINGS   Left Ventricle: Anterior, inferior, posterolateral, and anterolateral  hypokinesis. Left ventricular ejection fraction, by estimation, is 40 to  45%. The left ventricle has mildly decreased function. The left ventricle  has no regional wall motion  abnormalities. The left ventricular internal cavity size was normal in  size. There is no left ventricular hypertrophy.   Right Ventricle: The right ventricular size is normal. No increase in  right ventricular wall thickness. Right ventricular systolic function is  normal.   Left Atrium: Left atrial size was normal in size. No left atrial/left  atrial appendage thrombus was detected.   Right Atrium: Right atrial size was normal in size.   Pericardium: A small pericardial effusion is present.   Mitral Valve: Regurgitatnt volume 50 mL. No evidence of pulmonary vein  flow reversal. The mitral valve is normal in structure. Mild mitral  annular calcification. Moderate mitral valve regurgitation. No evidence of  mitral valve stenosis.   Tricuspid Valve: The tricuspid valve is normal in structure. Tricuspid  valve regurgitation is mild . No evidence of tricuspid stenosis.   Aortic Valve: Restricted motion of the left coronary cusp. The aortic  valve is tricuspid. There is mild calcification of the aortic  valve. There  is mild thickening of the aortic valve. Aortic valve regurgitation is  mild. No aortic stenosis is present.   Pulmonic Valve: The pulmonic valve was normal in structure. Pulmonic valve  regurgitation is not visualized. No evidence of pulmonic stenosis.   Aorta: The aortic root is normal in size and structure. There is mild  (Grade II) atheroma plaque involving the descending aorta.   Venous: The inferior vena cava is normal in size with greater than 50%  respiratory variability, suggesting right atrial pressure of 3 mmHg.   IAS/Shunts: No atrial level shunt detected by color flow Doppler.   Patient Profile     81 y.o. female with PMH of CAD s/p DES to RCA in 2021, severe MAC, HTN, HLD, multinodular thyroid s/p thyroidectomy in 2019, anemia, GERD, known RBBB, DM2, OSA, carotid artery disease with 50-69% stenosis bilaterally, breast cancer, and lung cancer s/p LVATS 2019, negative myoview stress test in 2013 who was seen 04/23/2021 for the evaluation of STEMI.  Assessment & Plan    STEMI: underwent cardiac cath noted above with successful PCI/DES x1  of mLcx. Did have residual thrombus in small marginal branch from stented segment. Also with 50-70% pLAD stenosis to be treated medically. Treated with aggrastat for 18 hr post cath. hsTn >24000. Plan for DAPT with ASA/Brilnta for at least one year. Working with CR, PT, OT -- on ASA, Brilinta, statin, BB, Imdur    HFrEF/ICM: LVEF of 30-35%, with septal, apical, lateral and posterior lateral hypokinesis. Diuresed with IV lasix,  net - 4.4L, weight down 154.1>>149.5lbs -- TEE actually showed improved EF of 40-45% -- GDMT limited in the setting of AKI and low blood pressures -- continue BB    Acute kidney injury: baseline Cr 1.3, up to 2.59>>2.32 post cath with diuresis. Given gentle IVFs overnight, will stop this morning. -- follow BMET   Moderate to severe MR: MV peak gradient, 8.0 mmHg. The mean  mitral valve gradient is 3.0  mmHg.  -- TEE yesterday noted above with moderate/severe MR, MV regurg volume 39m -- structural team plans to evaluate as an outpatient   HLD: LDL 58 -- on praluent PTA reports compliance   HTN: blood pressures have been borderline soft -- continue coreg 3.124mBID   DM: Hgb A1c 6.1 -- on Trijardy PTA (empag-linaglip-metfor) -- SSI while inpatient   Anemia: steady decline since admission 10.1>>7.7. Improved at 9.0, back down to 8>>7.9 -- will transfuse 1 unit PRBCs -- Stool cards pending  Lung CA s/p VATS Breast CA GERD   PT/OT evaluate     For questions or updates, please contact [facility] Please consult [email or web address] for contact info under        Signed, LiReino BellisNP  05/05/2021, 10:50 AM     Patient seen and examined. Agree with assessment and plan.  Renal function improved with hydration overnight; Cr 2.32 today.  She is to receive a unit of packed red blood cells today for continued slow decline in hemoglobin.  Stool guaiacs pending.   ThTroy SineMD, FAWest Norman Endoscopy/16/2023 11:40 AM

## 2021-05-05 NOTE — Progress Notes (Addendum)
Pt completed transfusion of 1 unit PRBCs. At completion pt is [facility] and tachypneic. Pt states it is no change from her usual episodes of SHOB. Pt denies any CP, back pain, and is afebrile.   Raelyn Number, RN

## 2021-05-05 NOTE — Progress Notes (Signed)
Pt complains of severe headache unable to sleep. Pt stated its more of vertigo. Pt denies other symptoms. Pt denies side weakness. Pt able to use bedside commode independently. Tylenol given. Rose,MD notified. RN will continue to monitor pt.

## 2021-05-06 ENCOUNTER — Inpatient Hospital Stay (HOSPITAL_COMMUNITY): Payer: Medicare Other

## 2021-05-06 DIAGNOSIS — R0602 Shortness of breath: Secondary | ICD-10-CM

## 2021-05-06 DIAGNOSIS — N184 Chronic kidney disease, stage 4 (severe): Secondary | ICD-10-CM

## 2021-05-06 DIAGNOSIS — I2121 ST elevation (STEMI) myocardial infarction involving left circumflex coronary artery: Secondary | ICD-10-CM | POA: Diagnosis not present

## 2021-05-06 DIAGNOSIS — I340 Nonrheumatic mitral (valve) insufficiency: Secondary | ICD-10-CM | POA: Diagnosis not present

## 2021-05-06 LAB — TYPE AND SCREEN
ABO/RH(D): B POS
Antibody Screen: NEGATIVE
Unit division: 0

## 2021-05-06 LAB — BPAM RBC
Blood Product Expiration Date: 202303022359
ISSUE DATE / TIME: 202302161530
Unit Type and Rh: 7300

## 2021-05-06 LAB — CBC
HCT: 28.5 % — ABNORMAL LOW (ref 36.0–46.0)
Hemoglobin: 9.4 g/dL — ABNORMAL LOW (ref 12.0–15.0)
MCH: 27.2 pg (ref 26.0–34.0)
MCHC: 33 g/dL (ref 30.0–36.0)
MCV: 82.4 fL (ref 80.0–100.0)
Platelets: 342 10*3/uL (ref 150–400)
RBC: 3.46 MIL/uL — ABNORMAL LOW (ref 3.87–5.11)
RDW: 18.8 % — ABNORMAL HIGH (ref 11.5–15.5)
WBC: 8.6 10*3/uL (ref 4.0–10.5)
nRBC: 1.5 % — ABNORMAL HIGH (ref 0.0–0.2)

## 2021-05-06 LAB — BASIC METABOLIC PANEL
Anion gap: 14 (ref 5–15)
BUN: 68 mg/dL — ABNORMAL HIGH (ref 8–23)
CO2: 19 mmol/L — ABNORMAL LOW (ref 22–32)
Calcium: 7.9 mg/dL — ABNORMAL LOW (ref 8.9–10.3)
Chloride: 99 mmol/L (ref 98–111)
Creatinine, Ser: 2.34 mg/dL — ABNORMAL HIGH (ref 0.44–1.00)
GFR, Estimated: 21 mL/min — ABNORMAL LOW (ref >60)
Glucose, Bld: 196 mg/dL — ABNORMAL HIGH (ref 70–99)
Potassium: 4.3 mmol/L (ref 3.5–5.1)
Sodium: 132 mmol/L — ABNORMAL LOW (ref 135–145)

## 2021-05-06 LAB — GLUCOSE, CAPILLARY
Glucose-Capillary: 172 mg/dL — ABNORMAL HIGH (ref 70–99)
Glucose-Capillary: 185 mg/dL — ABNORMAL HIGH (ref 70–99)
Glucose-Capillary: 218 mg/dL — ABNORMAL HIGH (ref 70–99)
Glucose-Capillary: 255 mg/dL — ABNORMAL HIGH (ref 70–99)

## 2021-05-06 MED ORDER — FUROSEMIDE 10 MG/ML IJ SOLN
40.0000 mg | Freq: Once | INTRAMUSCULAR | Status: AC
  Administered 2021-05-06: 40 mg via INTRAVENOUS
  Filled 2021-05-06: qty 4

## 2021-05-06 NOTE — Progress Notes (Addendum)
Progress Note  Patient Name: Karen West Date of Encounter: 05/06/2021  [facility] Cardiologist: Minus Breeding, MD   Subjective   Increased dyspnea last night after blood transfusion.   Inpatient Medications    Scheduled Meds:  aspirin EC  81 mg Oral Daily   carvedilol  3.125 mg Oral BID WC   enoxaparin (LOVENOX) injection  30 mg Subcutaneous Q24H   feeding supplement (GLUCERNA SHAKE)  237 mL Oral TID BM   ferrous sulfate  325 mg Oral BID WC   insulin aspart  0-15 Units Subcutaneous TID WC   isosorbide mononitrate  30 mg Oral Daily   levothyroxine  88 mcg Oral Q0600   pantoprazole  40 mg Oral Daily   sodium chloride flush  3 mL Intravenous Q12H   ticagrelor  90 mg Oral BID   Continuous Infusions:  sodium chloride Stopped (05/02/21 1428)   sodium chloride     PRN Meds: sodium chloride, acetaminophen, diazepam, labetalol, nitroGLYCERIN, sodium chloride flush   Vital Signs    Vitals:   05/06/21 0414 05/06/21 0428 05/06/21 0810 05/06/21 1030  BP: 124/78  109/72 114/65  Pulse: 89  80 82  Resp: 20  20 (!) 21  Temp: 97.6 F (36.4 C)  (!) 97.5 F (36.4 C) 97.6 F (36.4 C)  TempSrc: Oral  Oral Oral  SpO2: 95%  93% 100%  Weight:  67.8 kg    Height:        Intake/Output Summary (Last 24 hours) at 05/06/2021 1343 Last data filed at 05/06/2021 1029 Gross per 24 hour  Intake 728.5 ml  Output 550 ml  Net 178.5 ml   Last 3 Weights 05/06/2021 05/05/2021 05/04/2021  Weight (lbs) 149 lb 7.6 oz 149 lb 7.6 oz 149 lb 11.1 oz  Weight (kg) 67.8 kg 67.8 kg 67.9 kg      Telemetry    NSR without significant ventricular ectopy - Personally Reviewed  ECG    NSR with RBBB - Personally Reviewed  Physical Exam   GEN: No acute distress.   Neck: No JVD Cardiac: RRR, no murmurs, rubs, or gallops.  Respiratory: Clear to auscultation bilaterally. Mildly diminished breath sound without crackles. Inspiratory course sound near throat area on inspiration.  GI: Soft,  nontender, non-distended  MS: No edema; No deformity. Neuro:  Nonfocal  Psych: Normal affect   Labs    High Sensitivity Troponin:   Recent Labs  Lab 04/23/21 1000 04/23/21 1222  TROPONINIHS 22,485* >24,000*     Chemistry Recent Labs  Lab 05/04/21 0646 05/05/21 0204 05/06/21 0127  NA 136 134* 132*  K 4.0 3.9 4.3  CL 101 99 99  CO2 21* 20* 19*  GLUCOSE 182* 200* 196*  BUN 67* 65* 68*  CREATININE 2.51* 2.32* 2.34*  CALCIUM 7.5* 7.7* 7.9*  PROT  --  6.5  --   ALBUMIN  --  2.7*  --   AST  --  30  --   ALT  --  20  --   ALKPHOS  --  67  --   BILITOT  --  0.4  --   GFRNONAA 19* 21* 21*  ANIONGAP _0 Lipids No results for input(s): CHOL, TRIG, HDL, LABVLDL, LDLCALC, CHOLHDL in the last 168 hours.  Hematology Recent Labs  Lab 05/04/21 0646 05/05/21 0204 05/05/21 2053 05/06/21 0127  WBC 7.3 8.0  --  8.6  RBC 2.88* 2.96*  --  3.46*  HGB 8.0* 7.9* 8.8* 9.4*  HCT 24.6* 25.4* 26.8* 28.5*  MCV 85.4 85.8  --  82.4  MCH 27.8 26.7  --  27.2  MCHC 32.5 31.1  --  33.0  RDW 16.3* 16.9*  --  18.8*  PLT 343 362  --  342   Thyroid No results for input(s): TSH, FREET4 in the last 168 hours.  BNPNo results for input(s): BNP, PROBNP in the last 168 hours.  DDimer No results for input(s): DDIMER in the last 168 hours.   Radiology    No results found.  Cardiac Studies     Prox LAD to Mid LAD lesion is 70% stenosed.   Prox LAD lesion is 50% stenosed.   Mid Cx lesion is 100% stenosed.   Dist Cx lesion is 80% stenosed.   3rd Mrg lesion is 100% stenosed.   Non-stenotic Dist RCA lesion was previously treated.   Non-stenotic Mid RCA to Dist RCA lesion was previously treated.   A drug-eluting stent was successfully placed.   Post intervention, there is a 0% residual stenosis.   Post intervention, there is a 0% residual stenosis.   There is moderate left ventricular systolic dysfunction.   Acute inferolateral wall ST segment elevation secondary to total occlusion of a  large left circumflex coronary artery with TIMI 0 flow.   Calcification involving the proximal LAD with 50 and 70% stenoses.   Widely patent dominant RCA with previously placed 4 tandem stents proximally to distally without restenosis.   Moderate acute LV dysfunction with EF estimated at 35 to 40% with hypocontractility involving the mid distal anterolateral wall.  LVEDP 30 mm   Successful percutaneous coronary intervention to the left circumflex coronary artery with ultimate insertion of a 2.75 x 26 mm Medtronic Onyx frontiers stent postdilated to 3.04 mm with the 100% occlusion being reduced to 0% and brisk TIMI-3 flow in the major vessel.  There is residual thrombus and a small marginal branch arising from the stented segment which was totally occluded initially.  There also was faint thrombus in a very distal branch of the circumflex.  The plan is to continue Aggrastat for 18 hours post procedure.   RECOMMENDATION: DAPT indefinitely.  We will continue Aggrastat 18 hours postinfusion.  Optimize blood pressure.  Plan 2D echo Doppler study.  Continue carvedilol, consider ARB and transition to [facility] if LV function remains impaired.  Continue Praluent/Zetia with statin intolerance and probable familial hyperlipidemia.   Left Ventricle The left ventricular size is normal. There is moderate left ventricular systolic dysfunction. Moderate LV dysfunction with EF estimated at 35 - 40%.  Hypocontractility is present in the anterolateral wall.  LVEDP 30 mm   Diagnostic Dominance: Right Intervention    Patient Profile     81 y.o. female with PMH of CAD s/p DES to RCA in 2021, severe MAC, HTN, HLD, multinodular thyropid s/p thyroidectomy 2019, anemia, GERD, known RBBB, DM II, OSA, carotid artery disease, breast CA and luing CA s/p LVATS 2019 presented with inferolateral STEMI on 04/23/2021. Cath showed occluded LVC which was successfully treated. Post cath course complicated by AKI, CHF and  anemia  Assessment & Plan    Inferolateral STEMI - hs trop > 24000 - cath 04/23/2021 showed 70% prox LAD, 50% prox LAD, 100% mid LCx, 80% distal LCx, 100% OM3, patent stents in RCA. EF 35-40%. LVEDP 30 mmHg. Both the 100% mid LCx and 80% distal LCx lesion were treated with stent.  - ASA, Brilinta, statin, imdur and BB. ASA was held briefly due  to anemia, now resumed  Dyspnea:  - increased dyspnea after blood transfusion yesterday.  - consider give a single dose of IV lasix 74m for today. Obtain 2v CXR. SOB seems to be out of proportion to the degree of volume overload, if CXR normal, may need to consider VQ scan to rule out PE  HFrEF - EF 35-40% by cath - Echo 04/23/2021 EF 30-35%, septal, apical, lateral and posterior lateral hypokinesis, mild LVH, moderately elevated PASP, mild LAE, moderate to severe MR, moderate TR, mild to moderate AI with mild AS - underwent IV diuresis due to acute exacerbation after cath.   AKI: baseline Cr 1.3, up to 2.59 post cath, now down to 2.34  Moderate to severe MR - felt may be a candidate for mitraclip, underwent TEE on 05/02/2021 showed EF 40%, moderate to severe MR, no pulm vein flow reversal, MV regurgitant volume 50 ml.   Anemia: ASA was held briefly due to anemia, now resumed - transfused 1 units of PRBC - hgb improved to 9.4 this morning.   HTN: coreg 3.125 mg BID.   HLD: intolerant of crestor with history of elevated CK and rhabdomyolysis  CKD stage IV, baseline stage III: on Trijardy  For questions or updates, please contact CCrumplerPlease consult [email or web address] for contact info under        Signed, HAlmyra Deforest PPocola 05/06/2021, 1:43 PM      Patient seen and examined. Agree with assessment and plan.  Patient is to development of shortness of breath early a.m.  O2 sat remained stable.  Hemoglobin is increased to 9.4 this morning.  Lung exam reveals decreased breath sounds bilaterally.  Will give Lasix 40 mg IV now and check PA and  lateral chest x-ray.  Creatinine 2.34 today, improved from 2.51.  If continued shortness of breath persists, may need to check VQ scan.  Not a candidate for contrast with renal dysfunction.   TTroy Sine MD, FMedical Center Navicent [facility]2/17/2023 2:37 PM

## 2021-05-07 DIAGNOSIS — I2121 ST elevation (STEMI) myocardial infarction involving left circumflex coronary artery: Secondary | ICD-10-CM | POA: Diagnosis not present

## 2021-05-07 LAB — BASIC METABOLIC PANEL
Anion gap: 16 — ABNORMAL HIGH (ref 5–15)
BUN: 70 mg/dL — ABNORMAL HIGH (ref 8–23)
CO2: 18 mmol/L — ABNORMAL LOW (ref 22–32)
Calcium: 8 mg/dL — ABNORMAL LOW (ref 8.9–10.3)
Chloride: 101 mmol/L (ref 98–111)
Creatinine, Ser: 2.34 mg/dL — ABNORMAL HIGH (ref 0.44–1.00)
GFR, Estimated: 21 mL/min — ABNORMAL LOW (ref >60)
Glucose, Bld: 148 mg/dL — ABNORMAL HIGH (ref 70–99)
Potassium: 4.1 mmol/L (ref 3.5–5.1)
Sodium: 135 mmol/L (ref 135–145)

## 2021-05-07 LAB — GLUCOSE, CAPILLARY
Glucose-Capillary: 161 mg/dL — ABNORMAL HIGH (ref 70–99)
Glucose-Capillary: 144 mg/dL — ABNORMAL HIGH (ref 70–99)
Glucose-Capillary: 292 mg/dL — ABNORMAL HIGH (ref 70–99)
Glucose-Capillary: 253 mg/dL — ABNORMAL HIGH (ref 70–99)

## 2021-05-07 MED ORDER — HYDRALAZINE HCL 10 MG PO TABS
10.0000 mg | ORAL_TABLET | Freq: Three times a day (TID) | ORAL | Status: DC
  Administered 2021-05-07 – 2021-05-08 (×4): 10 mg via ORAL
  Filled 2021-05-07 (×5): qty 1

## 2021-05-07 MED ORDER — FUROSEMIDE 10 MG/ML IJ SOLN
40.0000 mg | Freq: Two times a day (BID) | INTRAMUSCULAR | Status: DC
  Administered 2021-05-07 – 2021-05-08 (×4): 40 mg via INTRAVENOUS
  Filled 2021-05-07 (×4): qty 4

## 2021-05-07 MED ORDER — EZETIMIBE 10 MG PO TABS
10.0000 mg | ORAL_TABLET | Freq: Every day | ORAL | Status: DC
  Administered 2021-05-07 – 2021-05-18 (×12): 10 mg via ORAL
  Filled 2021-05-07 (×12): qty 1

## 2021-05-07 MED ORDER — HEPARIN SODIUM (PORCINE) 5000 UNIT/ML IJ SOLN
5000.0000 [IU] | Freq: Three times a day (TID) | INTRAMUSCULAR | Status: DC
  Administered 2021-05-07 – 2021-05-10 (×10): 5000 [IU] via SUBCUTANEOUS
  Filled 2021-05-07 (×10): qty 1

## 2021-05-07 MED ORDER — POTASSIUM CHLORIDE CRYS ER 20 MEQ PO TBCR
40.0000 meq | EXTENDED_RELEASE_TABLET | Freq: Once | ORAL | Status: AC
  Administered 2021-05-07: 40 meq via ORAL
  Filled 2021-05-07: qty 2

## 2021-05-07 NOTE — Plan of Care (Signed)
?  Problem: Clinical Measurements: ?Goal: Will remain free from infection ?Outcome: Progressing ?  ?

## 2021-05-07 NOTE — Progress Notes (Signed)
Cardiology Progress Note  Patient ID: Karen West MRN: [id number] DOB: [birth year] Date of Encounter: 05/07/2021  Primary Cardiologist: Minus Breeding, MD  Subjective   Chief Complaint: Shortness of breath  HPI: Very short of breath.  Elevated JVD.  Very prominent murmur of mitral valve regurgitation.  Not that volume overloaded but I do worry that her valve will be an issue.  ROS:  All other ROS reviewed and negative. Pertinent positives noted in the HPI.     Inpatient Medications  Scheduled Meds:  aspirin EC  81 mg Oral Daily   carvedilol  3.125 mg Oral BID WC   enoxaparin (LOVENOX) injection  30 mg Subcutaneous Q24H   feeding supplement (GLUCERNA SHAKE)  237 mL Oral TID BM   ferrous sulfate  325 mg Oral BID WC   furosemide  40 mg Intravenous BID   insulin aspart  0-15 Units Subcutaneous TID WC   isosorbide mononitrate  30 mg Oral Daily   levothyroxine  88 mcg Oral Q0600   pantoprazole  40 mg Oral Daily   potassium chloride  40 mEq Oral Once   sodium chloride flush  3 mL Intravenous Q12H   ticagrelor  90 mg Oral BID   Continuous Infusions:  sodium chloride Stopped (05/02/21 1428)   sodium chloride     PRN Meds: sodium chloride, acetaminophen, diazepam, labetalol, nitroGLYCERIN, sodium chloride flush   Vital Signs   Vitals:   05/07/21 0039 05/07/21 0500 05/07/21 0907 05/07/21 1100  BP: 127/73 109/73 118/74 126/78  Pulse: 85 73 86 86  Resp: 20 18 20 20   Temp: (!) 97 F (36.1 C) 98 F (36.7 C) 98 F (36.7 C)   TempSrc: Axillary Oral Oral   SpO2: 100% 97% 98% 100%  Weight:      Height:        Intake/Output Summary (Last 24 hours) at 05/07/2021 1108 Last data filed at 05/07/2021 0222 Gross per 24 hour  Intake 410 ml  Output --  Net 410 ml   Last 3 Weights 05/06/2021 05/05/2021 05/04/2021  Weight (lbs) 149 lb 7.6 oz 149 lb 7.6 oz 149 lb 11.1 oz  Weight (kg) 67.8 kg 67.8 kg 67.9 kg      Telemetry  Overnight telemetry shows sinus rhythm in the 80s, which I  personally reviewed.   Physical Exam   Vitals:   05/07/21 0039 05/07/21 0500 05/07/21 0907 05/07/21 1100  BP: 127/73 109/73 118/74 126/78  Pulse: 85 73 86 86  Resp: 20 18 20 20   Temp: (!) 97 F (36.1 C) 98 F (36.7 C) 98 F (36.7 C)   TempSrc: Axillary Oral Oral   SpO2: 100% 97% 98% 100%  Weight:      Height:        Intake/Output Summary (Last 24 hours) at 05/07/2021 1108 Last data filed at 05/07/2021 0222 Gross per 24 hour  Intake 410 ml  Output --  Net 410 ml    Last 3 Weights 05/06/2021 05/05/2021 05/04/2021  Weight (lbs) 149 lb 7.6 oz 149 lb 7.6 oz 149 lb 11.1 oz  Weight (kg) 67.8 kg 67.8 kg 67.9 kg    Body mass index is 28.24 kg/m.   General: Well nourished, well developed, in no acute distress Head: Atraumatic, normal size  Eyes: PEERLA, EOMI  Neck: Supple, JVD 12 to 15 cm of water, prominent HJR Endocrine: No thryomegaly Cardiac: Normal S1, S2; RRR; 3 out of 6 harsh holosystolic murmur Lungs: Crackles at the lung bases Abd: Soft, nontender,  no hepatomegaly  Ext: No edema, pulses 2+ Musculoskeletal: No deformities, BUE and BLE strength normal and equal Skin: Warm and dry, no rashes   Neuro: Alert and oriented to person, place, time, and situation, CNII-XII grossly intact, no focal deficits  Psych: Normal mood and affect   Labs  High Sensitivity Troponin:   Recent Labs  Lab 04/23/21 1000 04/23/21 1222  TROPONINIHS 22,485* >24,000*     Cardiac EnzymesNo results for input(s): TROPONINI in the last 168 hours. No results for input(s): TROPIPOC in the last 168 hours.  Chemistry Recent Labs  Lab 05/04/21 0646 05/05/21 0204 05/06/21 0127  NA 136 134* 132*  K 4.0 3.9 4.3  CL 101 99 99  CO2 21* 20* 19*  GLUCOSE 182* 200* 196*  BUN 67* 65* 68*  CREATININE 2.51* 2.32* 2.34*  CALCIUM 7.5* 7.7* 7.9*  PROT  --  6.5  --   ALBUMIN  --  2.7*  --   AST  --  30  --   ALT  --  20  --   ALKPHOS  --  67  --   BILITOT  --  0.4  --   GFRNONAA 19* 21* 21*  ANIONGAP  14 15 14     Hematology Recent Labs  Lab 05/04/21 0646 05/05/21 0204 05/05/21 2053 05/06/21 0127  WBC 7.3 8.0  --  8.6  RBC 2.88* 2.96*  --  3.46*  HGB 8.0* 7.9* 8.8* 9.4*  HCT 24.6* 25.4* 26.8* 28.5*  MCV 85.4 85.8  --  82.4  MCH 27.8 26.7  --  27.2  MCHC 32.5 31.1  --  33.0  RDW 16.3* 16.9*  --  18.8*  PLT 343 362  --  342   BNPNo results for input(s): BNP, PROBNP in the last 168 hours.  DDimer No results for input(s): DDIMER in the last 168 hours.   Radiology  DG Chest 2 View  Result Date: 05/06/2021 CLINICAL DATA:  Dyspnea EXAM: CHEST - 2 VIEW COMPARISON:  Radiograph 04/27/2021, chest CT 01/31/2021 FINDINGS: The cardiac silhouette is obscured. There is a small to moderate size layering left pleural effusion, decreased from the prior exam. Adjacent left basilar opacities. No visible pneumothorax. Right lung is clear. No acute osseous abnormality. Right axillary surgical clips. IMPRESSION: Small to moderate sized layering left pleural effusion with adjacent left basilar opacities which could be atelectasis or infection. Size of this effusion has decreased since the prior exam. Electronically Signed   By: Maurine Simmering M.D.   On: 05/06/2021 17:28    Cardiac Studies  TTE 04/23/2021   Prox LAD to Mid LAD lesion is 70% stenosed.   Prox LAD lesion is 50% stenosed.   Mid Cx lesion is 100% stenosed.   Dist Cx lesion is 80% stenosed.   3rd Mrg lesion is 100% stenosed.   Non-stenotic Dist RCA lesion was previously treated.   Non-stenotic Mid RCA to Dist RCA lesion was previously treated.   A drug-eluting stent was successfully placed.   Post intervention, there is a 0% residual stenosis.   Post intervention, there is a 0% residual stenosis.   There is moderate left ventricular systolic dysfunction.  TTE 04/24/2021  1. Septal , apical, lateral and posterior lateral hypokinesis . Left  ventricular ejection fraction, by estimation, is 30 to 35%. The left  ventricle has moderately decreased  function. The left ventricle  demonstrates regional wall motion abnormalities  (see scoring diagram/findings for description). The left ventricular  internal cavity size was moderately  dilated. There is mild left  ventricular hypertrophy. Left ventricular diastolic parameters are  indeterminate.   2. Right ventricular systolic function is normal. The right ventricular  size is normal. There is moderately elevated pulmonary artery systolic  pressure.   3. Left atrial size was mildly dilated.   4. The mitral valve is abnormal. Moderate to severe mitral valve  regurgitation. No evidence of mitral stenosis. Severe mitral annular  calcification.   5. Tricuspid valve regurgitation is moderate.   6. The aortic valve is normal in structure. There is moderate  calcification of the aortic valve. There is moderate thickening of the  aortic valve. Aortic valve regurgitation is mild to moderate. Mild aortic  valve stenosis.   7. The inferior vena cava is normal in size with greater than 50%  respiratory variability, suggesting right atrial pressure of 3 mmHg.   Patient Profile  Karen West is a 81 y.o. female with CAD status post PCI to the RCA, hypertension, CKD stage IV, anemia, diabetes, sleep apnea, carotid artery disease who was admitted 04/23/2021 for inferolateral STEMI.  Course has been complicated by severe mitral valve regurgitation, AKI, systolic heart failure and anemia.  Assessment & Plan   #Inferolateral STEMI -Occluded circumflex status post PCI.  She does have 70% proximal LAD disease that is being treated medically. -Continue aspirin Brilinta for 1 year. -Course has been complicated by severe mitral valve regurgitation.  See discussion below. -No further chest pain episodes. -Also with acute systolic heart failure in the setting of large myocardial infarction. -Continue Coreg 3.125 mg twice daily. -Intolerant of statins.  On Praluent at home. Zetia 10 mg daily.    #Acute  systolic heart failure, EF 30-35% with regional wall motion abnormalities -Secondary to large MI. -Appears volume up.  Very short of breath.  Suspect this is related to MR.  See discussion below. -Continue Coreg 3.125 mg twice daily.  On Imdur 30 mg daily.  Add hydralazine 10 mg 3 times daily. -Increase diuresis to 40 mg IV twice daily. -Not a candidate for ACE/ARB/ARNI/MRA given CKD stage IV.  #SOB #Severe MR -I have reviewed her echocardiograms.  She has severe mitral valve regurgitation secondary to restricted posterior mitral valve leaflet movement in systole (3B pathology).  She has very prominent murmur on exam.  She is short of breath with elevated JVD. -Suspect shortness of breath was worsened on 05/05/2021 after receiving a blood transfusion.  Clearly in the setting of severe MR this can cause shortness of breath.  She is improved with diuresis.  I am not concerned for PE. -We will increase her Lasix to 40 mg IV twice daily. -I would like to repeat her echocardiogram.  I do have concerns about her going home.  We will need to have the valve evaluated again and may be need to readdress this with the structural heart team. -For now continue with Lasix and repeat echo.  #CKD stage IV -Creatinine stable.  #Carotid artery disease -lipid lowering agents  #HTN -above meds   #Anemia -suspect 2/2 CKD -iron supplement.  -no bleeding.  -received blood transfusion 2/16 due to hemoglobin 7.9.  We will transfuse to maintain hemoglobin above 8.  She had an appropriate response.  No concern for bleeding.  FEN -No intravenous fluids -DVT PPx: Stop Lovenox.  Start heparin given CKD. -Code: Full -Disposition: Home health PT has been recommended.  Shortness of breath is concerning.  Severe MR is present.  Needs further diuresis and further work-up.  For  questions or updates, please contact [place] Please consult [email or web address] for contact info under   Signed, Lake Bells T. Audie Box, MD,  [place]  05/07/2021 11:08 AM

## 2021-05-07 NOTE — Progress Notes (Signed)
CARDIAC REHAB PHASE I   PRE:  Rate/Rhythm: 84 SR    BP: sitting 99/61    SaO2: 98 RA  MODE:  Ambulation: 100 ft   POST:  Rate/Rhythm: 100 ST    BP: sitting 108/57     SaO2: 96 RA   Pt to BSC to urinate then back to EOB to rest. SOB with activity. Pt ambulated with RW, rest x2 for fatigue and SOB. Return to EOB. VSS. Pt did have short run of ectopy before walking. None while walking. [place], ACSM 05/07/2021 2:23 PM

## 2021-05-08 ENCOUNTER — Inpatient Hospital Stay (HOSPITAL_COMMUNITY): Payer: Medicare Other

## 2021-05-08 DIAGNOSIS — I2121 ST elevation (STEMI) myocardial infarction involving left circumflex coronary artery: Secondary | ICD-10-CM | POA: Diagnosis not present

## 2021-05-08 LAB — BASIC METABOLIC PANEL
Anion gap: 14 (ref 5–15)
BUN: 73 mg/dL — ABNORMAL HIGH (ref 8–23)
CO2: 19 mmol/L — ABNORMAL LOW (ref 22–32)
Calcium: 8.1 mg/dL — ABNORMAL LOW (ref 8.9–10.3)
Chloride: 102 mmol/L (ref 98–111)
Creatinine, Ser: 2.25 mg/dL — ABNORMAL HIGH (ref 0.44–1.00)
GFR, Estimated: 22 mL/min — ABNORMAL LOW (ref >60)
Glucose, Bld: 154 mg/dL — ABNORMAL HIGH (ref 70–99)
Potassium: 4.3 mmol/L (ref 3.5–5.1)
Sodium: 135 mmol/L (ref 135–145)

## 2021-05-08 LAB — GLUCOSE, CAPILLARY
Glucose-Capillary: 156 mg/dL — ABNORMAL HIGH (ref 70–99)
Glucose-Capillary: 161 mg/dL — ABNORMAL HIGH (ref 70–99)
Glucose-Capillary: 241 mg/dL — ABNORMAL HIGH (ref 70–99)
Glucose-Capillary: 228 mg/dL — ABNORMAL HIGH (ref 70–99)

## 2021-05-08 NOTE — Plan of Care (Signed)
  Problem: Clinical Measurements: Goal: Will remain free from infection Outcome: Progressing   Problem: Health Behavior/Discharge Planning: Goal: Ability to manage health-related needs will improve Outcome: Not Progressing   

## 2021-05-08 NOTE — Progress Notes (Signed)
Cardiology Progress Note  Patient ID: Karen West MRN: [id number] DOB: 1941-03-02 Date of Encounter: 05/08/2021  Primary Cardiologist: Minus Breeding, MD  Subjective   Chief Complaint: SOB  HPI: Still short of breath with exertion.  Good diuresis.  Murmur less prominent but still there.  Planning for further diuresis.  Needs to get more active.  ROS:  All other ROS reviewed and negative. Pertinent positives noted in the HPI.     Inpatient Medications  Scheduled Meds:  aspirin EC  81 mg Oral Daily   carvedilol  3.125 mg Oral BID WC   ezetimibe  10 mg Oral Daily   feeding supplement (GLUCERNA SHAKE)  237 mL Oral TID BM   ferrous sulfate  325 mg Oral BID WC   furosemide  40 mg Intravenous BID   heparin  5,000 Units Subcutaneous Q8H   hydrALAZINE  10 mg Oral Q8H   insulin aspart  0-15 Units Subcutaneous TID WC   isosorbide mononitrate  30 mg Oral Daily   levothyroxine  88 mcg Oral Q0600   pantoprazole  40 mg Oral Daily   sodium chloride flush  3 mL Intravenous Q12H   ticagrelor  90 mg Oral BID   Continuous Infusions:  sodium chloride Stopped (05/02/21 1428)   sodium chloride     PRN Meds: sodium chloride, acetaminophen, diazepam, labetalol, nitroGLYCERIN, sodium chloride flush   Vital Signs   Vitals:   05/07/21 2139 05/08/21 0001 05/08/21 0427 05/08/21 0843  BP: 109/70 122/80 115/64 118/74  Pulse: 85 85 80 86  Resp: 20 20 20 19   Temp: 98.2 F (36.8 C) 97.6 F (36.4 C) 97.6 F (36.4 C) 97.6 F (36.4 C)  TempSrc: Oral Oral Oral Oral  SpO2: 97% 96% 97% 96%  Weight:      Height:        Intake/Output Summary (Last 24 hours) at 05/08/2021 0955 Last data filed at 05/08/2021 0900 Gross per 24 hour  Intake 290 ml  Output 2250 ml  Net -1960 ml   Last 3 Weights 05/06/2021 05/05/2021 05/04/2021  Weight (lbs) 149 lb 7.6 oz 149 lb 7.6 oz 149 lb 11.1 oz  Weight (kg) 67.8 kg 67.8 kg 67.9 kg      Telemetry  Overnight telemetry shows sinus rhythm in the 70s, which I  personally reviewed.   Physical Exam   Vitals:   05/07/21 2139 05/08/21 0001 05/08/21 0427 05/08/21 0843  BP: 109/70 122/80 115/64 118/74  Pulse: 85 85 80 86  Resp: 20 20 20 19   Temp: 98.2 F (36.8 C) 97.6 F (36.4 C) 97.6 F (36.4 C) 97.6 F (36.4 C)  TempSrc: Oral Oral Oral Oral  SpO2: 97% 96% 97% 96%  Weight:      Height:        Intake/Output Summary (Last 24 hours) at 05/08/2021 0955 Last data filed at 05/08/2021 0900 Gross per 24 hour  Intake 290 ml  Output 2250 ml  Net -1960 ml    Last 3 Weights 05/06/2021 05/05/2021 05/04/2021  Weight (lbs) 149 lb 7.6 oz 149 lb 7.6 oz 149 lb 11.1 oz  Weight (kg) 67.8 kg 67.8 kg 67.9 kg    Body mass index is 28.24 kg/m.   General: Well nourished, well developed, in no acute distress Head: Atraumatic, normal size  Eyes: PEERLA, EOMI  Neck: Supple, no JVD Endocrine: No thryomegaly Cardiac: Normal S1, S2; RRR; 3 out of 6 holosystolic murmur Lungs: Diminished breath sounds Abd: Soft, nontender, no hepatomegaly  Ext: No  edema, pulses 2+ Musculoskeletal: No deformities, BUE and BLE strength normal and equal Skin: Warm and dry, no rashes   Neuro: Alert and oriented to person, place, time, and situation, CNII-XII grossly intact, no focal deficits  Psych: Normal mood and affect   Labs  High Sensitivity Troponin:   Recent Labs  Lab 04/23/21 1000 04/23/21 1222  TROPONINIHS 22,485* >24,000*     Cardiac EnzymesNo results for input(s): TROPONINI in the last 168 hours. No results for input(s): TROPIPOC in the last 168 hours.  Chemistry Recent Labs  Lab 05/05/21 0204 05/06/21 0127 05/07/21 1132 05/08/21 0206  NA 134* 132* 135 135  K 3.9 4.3 4.1 4.3  CL 99 99 101 102  CO2 20* 19* 18* 19*  GLUCOSE 200* 196* 148* 154*  BUN 65* 68* 70* 73*  CREATININE 2.32* 2.34* 2.34* 2.25*  CALCIUM 7.7* 7.9* 8.0* 8.1*  PROT 6.5  --   --   --   ALBUMIN 2.7*  --   --   --   AST 30  --   --   --   ALT 20  --   --   --   ALKPHOS 67  --   --   --    BILITOT 0.4  --   --   --   GFRNONAA 21* 21* 21* 22*  ANIONGAP 15 14 16* 14    Hematology Recent Labs  Lab 05/04/21 0646 05/05/21 0204 05/05/21 2053 05/06/21 0127  WBC 7.3 8.0  --  8.6  RBC 2.88* 2.96*  --  3.46*  HGB 8.0* 7.9* 8.8* 9.4*  HCT 24.6* 25.4* 26.8* 28.5*  MCV 85.4 85.8  --  82.4  MCH 27.8 26.7  --  27.2  MCHC 32.5 31.1  --  33.0  RDW 16.3* 16.9*  --  18.8*  PLT 343 362  --  342   BNPNo results for input(s): BNP, PROBNP in the last 168 hours.  DDimer No results for input(s): DDIMER in the last 168 hours.   Radiology  DG Chest 2 View  Result Date: 05/06/2021 CLINICAL DATA:  Dyspnea EXAM: CHEST - 2 VIEW COMPARISON:  Radiograph 04/27/2021, chest CT 01/31/2021 FINDINGS: The cardiac silhouette is obscured. There is a small to moderate size layering left pleural effusion, decreased from the prior exam. Adjacent left basilar opacities. No visible pneumothorax. Right lung is clear. No acute osseous abnormality. Right axillary surgical clips. IMPRESSION: Small to moderate sized layering left pleural effusion with adjacent left basilar opacities which could be atelectasis or infection. Size of this effusion has decreased since the prior exam. Electronically Signed   By: Maurine Simmering M.D.   On: 05/06/2021 17:28    Cardiac Studies  LHC 04/23/2021   Prox LAD to Mid LAD lesion is 70% stenosed.   Prox LAD lesion is 50% stenosed.   Mid Cx lesion is 100% stenosed.   Dist Cx lesion is 80% stenosed.   3rd Mrg lesion is 100% stenosed.   Non-stenotic Dist RCA lesion was previously treated.   Non-stenotic Mid RCA to Dist RCA lesion was previously treated.   A drug-eluting stent was successfully placed.   Post intervention, there is a 0% residual stenosis.   Post intervention, there is a 0% residual stenosis.   There is moderate left ventricular systolic dysfunction.  TTE 04/24/2021  1. Septal , apical, lateral and posterior lateral hypokinesis . Left  ventricular ejection fraction, by  estimation, is 30 to 35%. The left  ventricle has moderately decreased  function. The left ventricle  demonstrates regional wall motion abnormalities  (see scoring diagram/findings for description). The left ventricular  internal cavity size was moderately dilated. There is mild left  ventricular hypertrophy. Left ventricular diastolic parameters are  indeterminate.   2. Right ventricular systolic function is normal. The right ventricular  size is normal. There is moderately elevated pulmonary artery systolic  pressure.   3. Left atrial size was mildly dilated.   4. The mitral valve is abnormal. Moderate to severe mitral valve  regurgitation. No evidence of mitral stenosis. Severe mitral annular  calcification.   5. Tricuspid valve regurgitation is moderate.   6. The aortic valve is normal in structure. There is moderate  calcification of the aortic valve. There is moderate thickening of the  aortic valve. Aortic valve regurgitation is mild to moderate. Mild aortic  valve stenosis.   7. The inferior vena cava is normal in size with greater than 50%  respiratory variability, suggesting right atrial pressure of 3 mmHg.   Patient Profile  Karen West is a 81 y.o. female with CAD status post PCI to the RCA, hypertension, CKD stage IV, anemia, diabetes, sleep apnea, carotid artery disease who was admitted 04/23/2021 for inferolateral STEMI.  Course has been complicated by severe mitral valve regurgitation, AKI, systolic heart failure and anemia.  Assessment & Plan   #Inferolateral STEMI -Admitted with inferolateral STEMI.  Status post PCI.  Does have proximal 70% LAD disease that is being treated medically. -Continue aspirin Brilinta for 1 year. -Course complicated by severe mitral valve regurgitation. -No chest pain episodes.  On Coreg 3.125 mg twice daily.  On home Praluent.  On Zetia as well.  Intolerant of statins.  #Acute systolic heart failure, EF 30-35% -Continue with diuresis  40 mg IV twice daily.  Good response yesterday.  Net -1 L.  She has CKD stage IV so we will be cautious. -Continue Coreg 3.125 mg twice daily.  On Imdur 30 mg daily.  On hydralazine 10 mg 3 times daily. -Not a candidate for ACE/ARB/Arni/MRA given CKD stage IV. -Hopefully transition to oral diuretics tomorrow.  #Severe mitral valve regurgitation -Secondary to wall motion abnormality. -Prominent murmur on exam. -Still short of breath with activity. -Continue diuresis as above. -I would like to repeat her echocardiogram.  Her volume status is much improved with diuresis yesterday.  We will continue with this.  Kidney function is stable. -May need to reevaluate her for MitraClip this admission.  We will see how she does over the next 24 to 48 hours.  #CKD stage IV -Stable creatinine.  #Carotid artery disease. -On lipid-lowering agents.  #Anemia -Repeat CBC tomorrow.  Has received 1 transfusion of packed red blood cells.  Suspect this is secondary to CKD.  No obvious signs of bleeding.  #FEN -No intravenous fluids -DVT PPx: Subcutaneous heparin -Code: Full -Disposition: Anticipate home in the next 24 to 48 hours pending symptoms and volume removal.  She does have severe MR which is symptomatic for her.  May need to be evaluated inpatient for MitraClip again.  For questions or updates, please contact [place] Please consult [email or web address] for contact info under     Signed, Lake Bells T. Audie Box, MD, [place]  05/08/2021 9:55 AM

## 2021-05-08 NOTE — Progress Notes (Signed)
PT Cancellation Note  Patient Details Name: Karen West MRN: [id number] DOB: 06-13-1940   Cancelled Treatment:    Reason Eval/Treat Not Completed: PT screened, no needs identified, will sign off  See PT evaluation of 05/05/21. Received new order with no new needs identified. Continue to mobilize with [facility].    Arby Barrette, PT Acute Rehabilitation Services  Pager [phone] Office [phone]  [name] 05/08/2021, 2:25 PM

## 2021-05-09 ENCOUNTER — Inpatient Hospital Stay (HOSPITAL_COMMUNITY): Payer: Medicare Other

## 2021-05-09 DIAGNOSIS — I2121 ST elevation (STEMI) myocardial infarction involving left circumflex coronary artery: Secondary | ICD-10-CM | POA: Diagnosis not present

## 2021-05-09 DIAGNOSIS — I5023 Acute on chronic systolic (congestive) heart failure: Secondary | ICD-10-CM | POA: Diagnosis not present

## 2021-05-09 DIAGNOSIS — I340 Nonrheumatic mitral (valve) insufficiency: Secondary | ICD-10-CM

## 2021-05-09 LAB — ECHOCARDIOGRAM COMPLETE
Area-P 1/2: 7.86 cm2
Calc EF: 43.2 %
Height: 61 in
MV M vel: 5.11 m/s
MV Peak grad: 104.2 mmHg
MV VTI: 1.8 cm2
P 1/2 time: 264 msec
Radius: 0.6 cm
S' Lateral: 3.8 cm
Single Plane A2C EF: 35.9 %
Single Plane A4C EF: 45.6 %
Weight: 2391.55 oz

## 2021-05-09 LAB — BASIC METABOLIC PANEL
Anion gap: 15 (ref 5–15)
BUN: 66 mg/dL — ABNORMAL HIGH (ref 8–23)
CO2: 21 mmol/L — ABNORMAL LOW (ref 22–32)
Calcium: 8.2 mg/dL — ABNORMAL LOW (ref 8.9–10.3)
Chloride: 104 mmol/L (ref 98–111)
Creatinine, Ser: 2.25 mg/dL — ABNORMAL HIGH (ref 0.44–1.00)
GFR, Estimated: 22 mL/min — ABNORMAL LOW (ref >60)
Glucose, Bld: 198 mg/dL — ABNORMAL HIGH (ref 70–99)
Potassium: 3.8 mmol/L (ref 3.5–5.1)
Sodium: 140 mmol/L (ref 135–145)

## 2021-05-09 LAB — CBC
HCT: 31.5 % — ABNORMAL LOW (ref 36.0–46.0)
Hemoglobin: 10 g/dL — ABNORMAL LOW (ref 12.0–15.0)
MCH: 27.4 pg (ref 26.0–34.0)
MCHC: 31.7 g/dL (ref 30.0–36.0)
MCV: 86.3 fL (ref 80.0–100.0)
Platelets: 360 10*3/uL (ref 150–400)
RBC: 3.65 MIL/uL — ABNORMAL LOW (ref 3.87–5.11)
RDW: 20.8 % — ABNORMAL HIGH (ref 11.5–15.5)
WBC: 7.8 10*3/uL (ref 4.0–10.5)
nRBC: 0.4 % — ABNORMAL HIGH (ref 0.0–0.2)

## 2021-05-09 LAB — GLUCOSE, CAPILLARY
Glucose-Capillary: 171 mg/dL — ABNORMAL HIGH (ref 70–99)
Glucose-Capillary: 155 mg/dL — ABNORMAL HIGH (ref 70–99)
Glucose-Capillary: 284 mg/dL — ABNORMAL HIGH (ref 70–99)
Glucose-Capillary: 169 mg/dL — ABNORMAL HIGH (ref 70–99)

## 2021-05-09 MED ORDER — FUROSEMIDE 10 MG/ML IJ SOLN
40.0000 mg | Freq: Two times a day (BID) | INTRAMUSCULAR | Status: DC
  Administered 2021-05-09 – 2021-05-12 (×6): 40 mg via INTRAVENOUS
  Filled 2021-05-09 (×6): qty 4

## 2021-05-09 MED ORDER — FUROSEMIDE 40 MG PO TABS
40.0000 mg | ORAL_TABLET | Freq: Every day | ORAL | Status: DC
  Administered 2021-05-09: 40 mg via ORAL
  Filled 2021-05-09: qty 1

## 2021-05-09 NOTE — Care Management Important Message (Signed)
Important Message  Patient Details  Name: Karen West MRN: [id number] Date of Birth: Dec 07, 1940   Medicare Important Message Given:  Yes     [name] 05/09/2021, 9:02 AM

## 2021-05-09 NOTE — Progress Notes (Addendum)
Progress Note  Patient Name: Karen West Date of Encounter: 05/09/2021  [facility] Cardiologist: Minus Breeding, MD   Subjective   Feeling much better today. Sitting up on the side of the bed. Breathing is stable.   Inpatient Medications    Scheduled Meds:  aspirin EC  81 mg Oral Daily   carvedilol  3.125 mg Oral BID WC   ezetimibe  10 mg Oral Daily   feeding supplement (GLUCERNA SHAKE)  237 mL Oral TID BM   ferrous sulfate  325 mg Oral BID WC   furosemide  40 mg Intravenous BID   heparin  5,000 Units Subcutaneous Q8H   hydrALAZINE  10 mg Oral Q8H   insulin aspart  0-15 Units Subcutaneous TID WC   isosorbide mononitrate  30 mg Oral Daily   levothyroxine  88 mcg Oral Q0600   pantoprazole  40 mg Oral Daily   sodium chloride flush  3 mL Intravenous Q12H   ticagrelor  90 mg Oral BID   Continuous Infusions:  sodium chloride Stopped (05/02/21 1428)   sodium chloride     PRN Meds: sodium chloride, acetaminophen, diazepam, labetalol, nitroGLYCERIN, sodium chloride flush   Vital Signs    Vitals:   05/08/21 2311 05/09/21 0543 05/09/21 0815 05/09/21 0816  BP: 128/73 123/78 138/85 132/73  Pulse: 85 92 95 95  Resp: 20 (!) 21 20 20   Temp: 97.7 F (36.5 C) 97.6 F (36.4 C) 97.6 F (36.4 C)   TempSrc: Oral Oral Oral   SpO2: 100% 96% 100% 93%  Weight:      Height:        Intake/Output Summary (Last 24 hours) at 05/09/2021 0818 Last data filed at 05/08/2021 2300 Gross per 24 hour  Intake 0 ml  Output 601 ml  Net -601 ml   Last 3 Weights 05/06/2021 05/05/2021 05/04/2021  Weight (lbs) 149 lb 7.6 oz 149 lb 7.6 oz 149 lb 11.1 oz  Weight (kg) 67.8 kg 67.8 kg 67.9 kg      Telemetry    SR, PVCs - Personally Reviewed  ECG    No new tracing  Physical Exam   GEN: No acute distress.   Neck: No JVD Cardiac: RRR, + systolic murmur at LLUB, rubs, or gallops.  Respiratory: Clear to auscultation bilaterally. GI: Soft, nontender, non-distended  MS: No edema; No  deformity. Neuro:  Nonfocal  Psych: Normal affect   Labs    High Sensitivity Troponin:   Recent Labs  Lab 04/23/21 1000 04/23/21 1222  TROPONINIHS 22,485* >24,000*     Chemistry Recent Labs  Lab 05/05/21 0204 05/06/21 0127 05/07/21 1132 05/08/21 0206  NA 134* 132* 135 135  K 3.9 4.3 4.1 4.3  CL 99 99 101 102  CO2 20* 19* 18* 19*  GLUCOSE 200* 196* 148* 154*  BUN 65* 68* 70* 73*  CREATININE 2.32* 2.34* 2.34* 2.25*  CALCIUM 7.7* 7.9* 8.0* 8.1*  PROT 6.5  --   --   --   ALBUMIN 2.7*  --   --   --   AST 30  --   --   --   ALT 20  --   --   --   ALKPHOS 67  --   --   --   BILITOT 0.4  --   --   --   GFRNONAA 21* 21* 21* 22*  ANIONGAP 15 14 16* 14    Lipids No results for input(s): CHOL, TRIG, HDL, LABVLDL, LDLCALC, CHOLHDL in  the last 168 hours.  Hematology Recent Labs  Lab 05/05/21 0204 05/05/21 2053 05/06/21 0127 05/09/21 0732  WBC 8.0  --  8.6 7.8  RBC 2.96*  --  3.46* 3.65*  HGB 7.9* 8.8* 9.4* 10.0*  HCT 25.4* 26.8* 28.5* 31.5*  MCV 85.8  --  82.4 86.3  MCH 26.7  --  27.2 27.4  MCHC 31.1  --  33.0 31.7  RDW 16.9*  --  18.8* 20.8*  PLT 362  --  342 360   Thyroid No results for input(s): TSH, FREET4 in the last 168 hours.  BNPNo results for input(s): BNP, PROBNP in the last 168 hours.  DDimer No results for input(s): DDIMER in the last 168 hours.   Radiology    No results found.  Cardiac Studies   LHC 04/23/2021    Prox LAD to Mid LAD lesion is 70% stenosed.   Prox LAD lesion is 50% stenosed.   Mid Cx lesion is 100% stenosed.   Dist Cx lesion is 80% stenosed.   3rd Mrg lesion is 100% stenosed.   Non-stenotic Dist RCA lesion was previously treated.   Non-stenotic Mid RCA to Dist RCA lesion was previously treated.   A drug-eluting stent was successfully placed.   Post intervention, there is a 0% residual stenosis.   Post intervention, there is a 0% residual stenosis.   There is moderate left ventricular systolic dysfunction.   TTE 04/24/2021    1. Septal , apical, lateral and posterior lateral hypokinesis . Left  ventricular ejection fraction, by estimation, is 30 to 35%. The left  ventricle has moderately decreased function. The left ventricle  demonstrates regional wall motion abnormalities  (see scoring diagram/findings for description). The left ventricular  internal cavity size was moderately dilated. There is mild left  ventricular hypertrophy. Left ventricular diastolic parameters are  indeterminate.   2. Right ventricular systolic function is normal. The right ventricular  size is normal. There is moderately elevated pulmonary artery systolic  pressure.   3. Left atrial size was mildly dilated.   4. The mitral valve is abnormal. Moderate to severe mitral valve  regurgitation. No evidence of mitral stenosis. Severe mitral annular  calcification.   5. Tricuspid valve regurgitation is moderate.   6. The aortic valve is normal in structure. There is moderate  calcification of the aortic valve. There is moderate thickening of the  aortic valve. Aortic valve regurgitation is mild to moderate. Mild aortic  valve stenosis.   7. The inferior vena cava is normal in size with greater than 50%  respiratory variability, suggesting right atrial pressure of 3 mmHg.     Patient Profile     81 y.o. female with CAD status post PCI to the RCA, hypertension, CKD stage IV, anemia, diabetes, sleep apnea, carotid artery disease who was admitted 04/23/2021 for inferolateral STEMI.  Course has been complicated by severe mitral valve regurgitation, AKI, systolic heart failure and anemia.  Assessment & Plan    Inferolateral STEMI: Admitted with inferolateral STEMI.  Status post PCI. Does have proximal 70% LAD disease that is being treated medically. No further episodes of chest pain. -- planned for DAPT with aspirin/Brilinta for 1 year. -- On Coreg 3.125 mg twice daily.  On home Praluent.  On Zetia as well.  Intolerant of statins.   Acute  systolic heart failure: EF 30-35% with improvement to 40-45% on TEE -- has been diuresing with IV lasix, net - 4.8L. Diuresis has been limited with her CKD. Will  transition to lasix 40mg  PO daily. -- Continue Coreg 3.125 mg twice daily.  On Imdur 30 mg daily.  On hydralazine 10 mg 3 times daily. -- Not a candidate for ACE/ARB/Arni/MRA given CKD stage IV.  Severe mitral valve regurgitation: Still short of breath with activity. -- transition to lasix 40mg  PO daily today -- May need to reevaluate her for MitraClip this admission.  We will see how she does over the next 24 to 48 hours.   CKD stage IV: Cr remains stable round 2.3-2.2   Carotid artery disease: on Praluent and Zetia    Anemia: Has received 1 transfusion of packed red blood cells.   -- Suspect this is secondary to CKD.  No obvious signs of bleeding -- Hgb stable at 10  For questions or updates, please contact [place] Please consult [email or web address] for contact info under        Signed, Reino Bellis, NP  05/09/2021, 8:18 AM     ATTENDING ATTESTATION:  After conducting a review of all available clinical information with the care team, interviewing the patient, and performing a physical exam, I agree with the findings and plan described in this note.   Patient somewhat better after aggressive diuresis.  She is net negative almost 5 L.  She is still quite dyspneic and orthopneic.  I reviewed all of her relevant imaging.  I believe her TEE demonstrates carp NTA class IIIb severe mitral regurgitation.  Her mitral valve area is 3.3 cm with a mean gradient of 2 mmHg on her TTE.  I did broach the subject of potential transcatheter edge-to-edge repair with the patient.  I do not think that she would be a candidate for conventional surgery given her advanced age, chronic kidney disease which is now worse with a creatinine of around 2.5, and a recent acute myocardial infarction in severe LV dysfunction.  We will continue to  aggressively diurese her with IV Lasix.  We will monitor creatinine.  If she is unable to ambulate and become less orthopneic then I think she will need an intervention prior to discharge.  We will review at structural heart meeting tomorrow.  GEN: No acute distress.   Cardiac: RRR, 4/6 holosystolic murmur Respiratory: Clear to auscultation bilaterally. GI: Soft, nontender, non-distended  MS: No edema; No deformity. Neuro:  Nonfocal  Vasc:  +2 radial pulses   Lenna Sciara, MD Pager [phone]

## 2021-05-09 NOTE — Plan of Care (Signed)
  Problem: Clinical Measurements: Goal: Ability to maintain clinical measurements within normal limits will improve Outcome: Progressing Goal: Will remain free from infection Outcome: Progressing Goal: Respiratory complications will improve Outcome: Progressing Goal: Cardiovascular complication will be avoided Outcome: Progressing   

## 2021-05-09 NOTE — Progress Notes (Signed)
°  Echocardiogram 2D Echocardiogram has been performed.  Darlina Sicilian M 05/09/2021, 10:42 AM

## 2021-05-09 NOTE — Progress Notes (Signed)
Occupational Therapy Treatment Patient Details Name: Karen West MRN: [id number] DOB: Feb 07, 1941 Today's Date: 05/09/2021   History of present illness Karen West is a 81 y.o. female admitted 04/23/21 with STEMI. Coronary/Graft Acute MI Revascularization (04/23/2021); LEFT HEART CATH AND CORONARY ANGIOGRAPHY (04/23/2021); and TEE without cardioversion (05/02/2021) PMH includes CAD s/p DES to RCA in 2021, severe MAC, HTN, HLD, multinodular thyroid s/p thyroidectomy in 2019, anemia, GERD, known RBBB, DM2 (uncontrolled), OSA, carotid artery disease with 50-69% stenosis bilaterally, breast cancer, and lung cancer s/p LVATS 2019   OT comments  Patient continues to make steady progress towards goals in skilled OT session. Patient's session encompassed  education with regard to energy conservation with handout provided. Patient is completing the majority of the energy conservation strategies at baseline, with verbal acknowledgement for the remainder of the strategies. Discharge remains appropriate at this time, therapy will continue to follow.     Recommendations for follow up therapy are one component of a multi-disciplinary discharge planning process, led by the attending physician.  Recommendations may be updated based on patient status, additional functional criteria and insurance authorization.    Follow Up Recommendations  Home health OT    Assistance Recommended at Discharge Intermittent Supervision/Assistance  Patient can return home with the following  A little help with walking and/or transfers;A little help with bathing/dressing/bathroom;Assistance with cooking/housework;Assist for transportation;Help with stairs or ramp for entrance   Equipment Recommendations  BSC/3in1    Recommendations for Other Services      Precautions / Restrictions Precautions Precautions: Fall Precaution Comments: watch O2 Restrictions Weight Bearing Restrictions: No       Mobility Bed Mobility                General bed mobility comments: up in chair upon arrival    Transfers                         Balance                                           ADL either performed or assessed with clinical judgement   ADL Overall ADL's : Needs assistance/impaired                                       General ADL Comments: Session focus on energy conservation education with handout provided    Extremity/Trunk Assessment              Vision       Perception     Praxis      Cognition Arousal/Alertness: Awake/alert Behavior During Therapy: WFL for tasks assessed/performed Overall Cognitive Status: Within Functional Limits for tasks assessed                                          Exercises      Shoulder Instructions       General Comments      Pertinent Vitals/ Pain       Pain Assessment Pain Assessment: Faces Faces Pain Scale: Hurts a little bit Pain Location: cramping and discomfort in chest and abdomen Pain Descriptors / Indicators: Cramping Pain Intervention(s): Limited activity within  patient's tolerance, Monitored during session  Home Living                                          Prior Functioning/Environment              Frequency  Min 2X/week        Progress Toward Goals  OT Goals(current goals can now be found in the care plan section)  Progress towards OT goals: Progressing toward goals  Acute Rehab OT Goals Patient Stated Goal: to go home OT Goal Formulation: With patient Time For Goal Achievement: 05/19/21 Potential to Achieve Goals: Good  Plan Discharge plan remains appropriate    Co-evaluation                 AM-PAC OT "6 Clicks" Daily Activity     Outcome Measure   Help from another person eating meals?: None Help from another person taking care of personal grooming?: None Help from another person toileting, which includes using  toliet, bedpan, or urinal?: A Little Help from another person bathing (including washing, rinsing, drying)?: A Little Help from another person to put on and taking off regular upper body clothing?: None Help from another person to put on and taking off regular lower body clothing?: A Little 6 Click Score: 21    End of Session Equipment Utilized During Treatment: Other (comment) (energy conservation handout)  OT Visit Diagnosis: Other abnormalities of gait and mobility (R26.89);Muscle weakness (generalized) (M62.81)   Activity Tolerance Patient tolerated treatment well   Patient Left in chair;with call bell/phone within reach   Nurse Communication Mobility status        Time: 1445-1459 OT Time Calculation (min): 14 min  Charges: OT General Charges $OT Visit: 1 Visit OT Treatments $Self Care/Home Management : 8-22 mins  Corinne Ports E. Finnleigh Marchetti, OTR/L Acute Rehabilitation Services [phone] [place] 05/09/2021, 3:35 PM

## 2021-05-09 NOTE — Progress Notes (Signed)
CARDIAC REHAB PHASE I   PRE:  Rate/Rhythm: 95 SR    BP: sitting 113/59    SaO2:   MODE:  Ambulation: 170 ft   POST:  Rate/Rhythm: 110 ST    BP: sitting 116/50     SaO2: 95 RA  On arrival pt on EOB, sts she has CP, points to upper right chest. Sts she thinks it is from straining for BM. Pt ambulated with RW, SOB decreased from Saturday. Pt rested after 85 ft, her normal spot for rest. Gasping for breath but resolved with pursed lip breathing. Pt rested one more time on return trip. Tired and SOB toward end of walk. VSS. Sts CP did not increase but rates it 5/10. Rest on EOB then moved to recliner for lunch.  [place], ACSM 05/09/2021 1:31 PM

## 2021-05-09 NOTE — Progress Notes (Signed)
Inpatient Diabetes Program Recommendations  AACE/ADA: New Consensus Statement on Inpatient Glycemic Control   Target Ranges:  Prepandial:   less than 140 mg/dL      Peak postprandial:   less than 180 mg/dL (1-2 hours)      Critically ill patients:  140 - 180 mg/dL    Latest Reference Range & Units 05/08/21 06:23 05/08/21 11:32 05/08/21 15:11 05/08/21 21:35 05/09/21 05:43  Glucose-Capillary 70 - 99 mg/dL 156 (H) 161 (H) 241 (H) 228 (H) 171 (H)   Review of Glycemic Control  Diabetes history: DM2 Outpatient Diabetes medications: Trijardy XR 12.5-2.07-998 mg BID Current orders for Inpatient glycemic control: Novolog 0-15 units TID with meals  Inpatient Diabetes Program Recommendations:    Insulin: Please consider ordering Novolog 2 units TID with meals for meal coverage if patient eats at least 50% of meals.  Thanks, Barnie Alderman, RN, MSN, CDE Diabetes Coordinator Inpatient Diabetes Program [phone] (Team Pager from 8am to 5pm)

## 2021-05-10 DIAGNOSIS — I2121 ST elevation (STEMI) myocardial infarction involving left circumflex coronary artery: Secondary | ICD-10-CM | POA: Diagnosis not present

## 2021-05-10 DIAGNOSIS — I5023 Acute on chronic systolic (congestive) heart failure: Secondary | ICD-10-CM | POA: Diagnosis not present

## 2021-05-10 LAB — GLUCOSE, CAPILLARY
Glucose-Capillary: 147 mg/dL — ABNORMAL HIGH (ref 70–99)
Glucose-Capillary: 206 mg/dL — ABNORMAL HIGH (ref 70–99)
Glucose-Capillary: 266 mg/dL — ABNORMAL HIGH (ref 70–99)
Glucose-Capillary: 114 mg/dL — ABNORMAL HIGH (ref 70–99)

## 2021-05-10 LAB — BASIC METABOLIC PANEL
Anion gap: 15 (ref 5–15)
BUN: 66 mg/dL — ABNORMAL HIGH (ref 8–23)
CO2: 20 mmol/L — ABNORMAL LOW (ref 22–32)
Calcium: 8 mg/dL — ABNORMAL LOW (ref 8.9–10.3)
Chloride: 103 mmol/L (ref 98–111)
Creatinine, Ser: 2.14 mg/dL — ABNORMAL HIGH (ref 0.44–1.00)
GFR, Estimated: 23 mL/min — ABNORMAL LOW (ref >60)
Glucose, Bld: 151 mg/dL — ABNORMAL HIGH (ref 70–99)
Potassium: 3.5 mmol/L (ref 3.5–5.1)
Sodium: 138 mmol/L (ref 135–145)

## 2021-05-10 LAB — CBC
HCT: 29.8 % — ABNORMAL LOW (ref 36.0–46.0)
Hemoglobin: 9.2 g/dL — ABNORMAL LOW (ref 12.0–15.0)
MCH: 27.1 pg (ref 26.0–34.0)
MCHC: 30.9 g/dL (ref 30.0–36.0)
MCV: 87.9 fL (ref 80.0–100.0)
Platelets: 318 10*3/uL (ref 150–400)
RBC: 3.39 MIL/uL — ABNORMAL LOW (ref 3.87–5.11)
RDW: 21.2 % — ABNORMAL HIGH (ref 11.5–15.5)
WBC: 7.2 10*3/uL (ref 4.0–10.5)
nRBC: 0.4 % — ABNORMAL HIGH (ref 0.0–0.2)

## 2021-05-10 LAB — MAGNESIUM: Magnesium: 1.8 mg/dL (ref 1.7–2.4)

## 2021-05-10 MED ORDER — SODIUM CHLORIDE 0.9 % IV SOLN: INTRAVENOUS | Status: DC

## 2021-05-10 MED ORDER — METOLAZONE 5 MG PO TABS
2.5000 mg | ORAL_TABLET | Freq: Once | ORAL | Status: AC
  Administered 2021-05-10: 2.5 mg via ORAL
  Filled 2021-05-10: qty 1

## 2021-05-10 MED ORDER — POTASSIUM CHLORIDE CRYS ER 20 MEQ PO TBCR
30.0000 meq | EXTENDED_RELEASE_TABLET | Freq: Once | ORAL | Status: AC
  Administered 2021-05-10: 30 meq via ORAL
  Filled 2021-05-10: qty 1

## 2021-05-10 MED ORDER — HYDRALAZINE HCL 10 MG PO TABS
10.0000 mg | ORAL_TABLET | Freq: Three times a day (TID) | ORAL | Status: DC
  Administered 2021-05-10: 10 mg via ORAL
  Filled 2021-05-10 (×2): qty 1

## 2021-05-10 MED ORDER — ENOXAPARIN SODIUM 30 MG/0.3ML IJ SOSY
30.0000 mg | PREFILLED_SYRINGE | INTRAMUSCULAR | Status: DC
  Administered 2021-05-10 – 2021-05-17 (×8): 30 mg via SUBCUTANEOUS
  Filled 2021-05-10 (×8): qty 0.3

## 2021-05-10 MED ORDER — MAGNESIUM SULFATE 2 GM/50ML IV SOLN
2.0000 g | Freq: Once | INTRAVENOUS | Status: AC
  Administered 2021-05-10: 2 g via INTRAVENOUS
  Filled 2021-05-10: qty 50

## 2021-05-10 NOTE — Progress Notes (Addendum)
Progress Note  Patient Name: Karen West Date of Encounter: 05/10/2021  [facility] Cardiologist: Minus Breeding, MD   Subjective   SOB w/ minimal exertion, wants to stay in her home, will consider procedures so that she can live alone.   Inpatient Medications    Scheduled Meds:  aspirin EC  81 mg Oral Daily   carvedilol  3.125 mg Oral BID WC   ezetimibe  10 mg Oral Daily   feeding supplement (GLUCERNA SHAKE)  237 mL Oral TID BM   ferrous sulfate  325 mg Oral BID WC   furosemide  40 mg Intravenous BID   heparin  5,000 Units Subcutaneous Q8H   hydrALAZINE  10 mg Oral Q8H   insulin aspart  0-15 Units Subcutaneous TID WC   isosorbide mononitrate  30 mg Oral Daily   levothyroxine  88 mcg Oral Q0600   pantoprazole  40 mg Oral Daily   potassium chloride  30 mEq Oral Once   sodium chloride flush  3 mL Intravenous Q12H   ticagrelor  90 mg Oral BID   Continuous Infusions:  sodium chloride Stopped (05/02/21 1428)   sodium chloride     magnesium sulfate bolus IVPB     PRN Meds: sodium chloride, acetaminophen, diazepam, labetalol, nitroGLYCERIN, sodium chloride flush   Vital Signs    Vitals:   05/09/21 2132 05/09/21 2356 05/10/21 0324 05/10/21 0726  BP: 114/74 (!) 106/59 (!) 91/48 115/64  Pulse: 81 82 77 90  Resp: 20 16 20 20   Temp:  97.6 F (36.4 C) 98 F (36.7 C) 97.8 F (36.6 C)  TempSrc:  Oral Oral Oral  SpO2: 98% 100% 96% 97%  Weight:      Height:        Intake/Output Summary (Last 24 hours) at 05/10/2021 1610 Last data filed at 05/09/2021 2200 Gross per 24 hour  Intake 490 ml  Output --  Net 490 ml   Last 3 Weights 05/06/2021 05/05/2021 05/04/2021  Weight (lbs) 149 lb 7.6 oz 149 lb 7.6 oz 149 lb 11.1 oz  Weight (kg) 67.8 kg 67.8 kg 67.9 kg      Telemetry    SR, episode SVT - Personally Reviewed  ECG    No new tracing  Physical Exam   'General: Well developed, elderly, female in no acute distress Head: Eyes PERRLA, Head normocephalic and  atraumatic Lungs: clear bilaterally to auscultation. Heart: HRRR S1 S2, without rub or gallop.4/6 murmur. 4/4 extremity pulses are 2+ & equal. JVD to jaw Abdomen: Bowel sounds are present, abdomen soft and non-tender without masses or  hernias noted. Msk: Normal strength and tone for age. Extremities: No clubbing, cyanosis or edema.    Skin:  No rashes or lesions noted. Neuro: Alert and oriented X 3. Psych:  Good affect, responds appropriately   Labs    High Sensitivity Troponin:   Recent Labs  Lab 04/23/21 1000 04/23/21 1222  TROPONINIHS 22,485* >24,000*     Chemistry Recent Labs  Lab 05/05/21 0204 05/06/21 0127 05/08/21 0206 05/09/21 0732 05/10/21 0345  NA 134*   < > 135 140 138  K 3.9   < > 4.3 3.8 3.5  CL 99   < > 102 104 103  CO2 20*   < > 19* 21* 20*  GLUCOSE 200*   < > 154* 198* 151*  BUN 65*   < > 73* 66* 66*  CREATININE 2.32*   < > 2.25* 2.25* 2.14*  CALCIUM 7.7*   < >  8.1* 8.2* 8.0*  MG  --   --   --   --  1.8  PROT 6.5  --   --   --   --   ALBUMIN 2.7*  --   --   --   --   AST 30  --   --   --   --   ALT 20  --   --   --   --   ALKPHOS 67  --   --   --   --   BILITOT 0.4  --   --   --   --   GFRNONAA 21*   < > 22* 22* 23*  ANIONGAP 15   < > 14 15 15    < > = values in this interval not displayed.    Lipids No results for input(s): CHOL, TRIG, HDL, LABVLDL, LDLCALC, CHOLHDL in the last 168 hours.  Hematology Recent Labs  Lab 05/06/21 0127 05/09/21 0732 05/10/21 0345  WBC 8.6 7.8 7.2  RBC 3.46* 3.65* 3.39*  HGB 9.4* 10.0* 9.2*  HCT 28.5* 31.5* 29.8*  MCV 82.4 86.3 87.9  MCH 27.2 27.4 27.1  MCHC 33.0 31.7 30.9  RDW 18.8* 20.8* 21.2*  PLT 342 360 318   Thyroid No results for input(s): TSH, FREET4 in the last 168 hours.  BNPNo results for input(s): BNP, PROBNP in the last 168 hours.  DDimer No results for input(s): DDIMER in the last 168 hours.   Radiology    ECHOCARDIOGRAM COMPLETE  Result Date: 05/09/2021    ECHOCARDIOGRAM REPORT    Patient Name:   TAHIRA OLIVAREZ Date of Exam: 05/09/2021 Medical Rec #:  [id number]        Height:       61.0 in Accession #:    [id number]       Weight:       149.5 lb Date of Birth:  [birth year]         BSA:          1.669 m Patient Age:    4 years         BP:           137/73 mmHg Patient Gender: F                HR:           92 bpm. Exam Location:  Inpatient Procedure: 2D Echo, 3D Echo, Cardiac Doppler and Color Doppler Indications:    Mitral valve insufficiency I34.0  History:        Patient has prior history of Echocardiogram examinations, most                 recent 05/02/2021. CAD and Previous Myocardial Infarction,                 Carotid Disease, Aortic Valve Disease and Mitral Valve Disease,                 Signs/Symptoms:Dyspnea; Risk Factors:Hypertension, Sleep Apnea,                 Diabetes and Dyslipidemia. Chronic kidney disease. Anemia. GERD.                 Past history of breast cancer.  Sonographer:    Darlina Sicilian RDCS Referring Phys: [id number] [place]  Sonographer Comments: Image acquisition challenging due to respiratory motion. IMPRESSIONS  1. Left ventricular ejection fraction, by estimation, is 35 to 40%.  The left ventricle has moderately decreased function. The left ventricle demonstrates regional wall motion abnormalities (see scoring diagram/findings for description). The left ventricular internal cavity size was moderately dilated. There is mild left ventricular hypertrophy. Left ventricular diastolic parameters are consistent with Grade II diastolic dysfunction (pseudonormalization). There is severe akinesis of the left ventricular, entire inferior wall.  2. Right ventricular systolic function is normal. The right ventricular size is normal. There is severely elevated pulmonary artery systolic pressure.  3. Left atrial size was moderately dilated.  4. Right atrial size was moderately dilated.  5. The mitral valve is degenerative. Moderate to severe mitral valve  regurgitation. Moderate mitral annular calcification.  6. Tricuspid valve regurgitation is moderate.  7. The aortic valve is calcified. Aortic valve regurgitation is mild to moderate. FINDINGS  Left Ventricle: Left ventricular ejection fraction, by estimation, is 35 to 40%. The left ventricle has moderately decreased function. The left ventricle demonstrates regional wall motion abnormalities. Severe akinesis of the left ventricular, entire inferior wall. The left ventricular internal cavity size was moderately dilated. There is mild left ventricular hypertrophy. Left ventricular diastolic parameters are consistent with Grade II diastolic dysfunction (pseudonormalization). Right Ventricle: The right ventricular size is normal. Right vetricular wall thickness was not well visualized. Right ventricular systolic function is normal. There is severely elevated pulmonary artery systolic pressure. The tricuspid regurgitant velocity is 3.51 m/s, and with an assumed right atrial pressure of 15 mmHg, the estimated right ventricular systolic pressure is 86.7 mmHg. Left Atrium: Left atrial size was moderately dilated. Right Atrium: Right atrial size was moderately dilated. Pericardium: There is no evidence of pericardial effusion. Mitral Valve: The mitral valve is degenerative in appearance. Moderate mitral annular calcification. Moderate to severe mitral valve regurgitation. MV peak gradient, 5.0 mmHg. The mean mitral valve gradient is 2.0 mmHg. Tricuspid Valve: The tricuspid valve is grossly normal. Tricuspid valve regurgitation is moderate. Aortic Valve: The aortic valve is calcified. Aortic valve regurgitation is mild to moderate. Aortic regurgitation PHT measures 264 msec. Pulmonic Valve: The pulmonic valve was normal in structure. Pulmonic valve regurgitation is trivial. Aorta: The aortic root and ascending aorta are structurally normal, with no evidence of dilitation. IAS/Shunts: The atrial septum is grossly normal.   LEFT VENTRICLE PLAX 2D LVIDd:         4.20 cm     Diastology LVIDs:         3.80 cm     LV e' medial:    4.46 cm/s LV PW:         1.10 cm     LV E/e' medial:  25.4 LV IVS:        1.20 cm     LV e' lateral:   3.70 cm/s LVOT diam:     1.70 cm     LV E/e' lateral: 30.7 LV SV:         28 LV SV Index:   17 LVOT Area:     2.27 cm  LV Volumes (MOD) LV vol d, MOD A2C: 86.4 ml LV vol d, MOD A4C: 81.5 ml LV vol s, MOD A2C: 55.4 ml LV vol s, MOD A4C: 44.3 ml LV SV MOD A2C:     31.0 ml LV SV MOD A4C:     81.5 ml LV SV MOD BP:      37.8 ml RIGHT VENTRICLE RV S prime:     7.51 cm/s TAPSE (M-mode): 1.3 cm LEFT ATRIUM  Index        RIGHT ATRIUM           Index LA diam:        4.20 cm 2.52 cm/m   RA Area:     12.00 cm LA Vol (A2C):   75.9 ml 45.48 ml/m  RA Volume:   26.30 ml  15.76 ml/m LA Vol (A4C):   62.2 ml 37.27 ml/m LA Biplane Vol: 73.0 ml 43.74 ml/m  AORTIC VALVE LVOT Vmax:   80.30 cm/s LVOT Vmean:  55.200 cm/s LVOT VTI:    0.125 m AI PHT:      264 msec  AORTA Ao Root diam: 2.60 cm Ao Asc diam:  3.50 cm MITRAL VALVE                  TRICUSPID VALVE MV Area (PHT): 7.86 cm       TR Peak grad:   49.3 mmHg MV Area VTI:   1.80 cm       TR Vmax:        351.00 cm/s MV Peak grad:  5.0 mmHg MV Mean grad:  2.0 mmHg       SHUNTS MV Vmax:       1.12 m/s       Systemic VTI:  0.12 m MV Vmean:      54.7 cm/s      Systemic Diam: 1.70 cm MV Decel Time: 97 msec MR Peak grad:    104.2 mmHg MR Mean grad:    69.5 mmHg MR Vmax:         510.50 cm/s MR Vmean:        398.5 cm/s MR PISA:         2.26 cm MR PISA Eff ROA: 18 mm MR PISA Radius:  0.60 cm MV E velocity: 113.50 cm/s MV A velocity: 73.50 cm/s MV E/A ratio:  1.54 Mertie Moores MD Electronically signed by Mertie Moores MD Signature Date/Time: 05/09/2021/12:09:49 PM    Final     Cardiac Studies   LHC 04/23/2021    Prox LAD to Mid LAD lesion is 70% stenosed.   Prox LAD lesion is 50% stenosed.   Mid Cx lesion is 100% stenosed.   Dist Cx lesion is 80% stenosed.   3rd  Mrg lesion is 100% stenosed.   Non-stenotic Dist RCA lesion was previously treated.   Non-stenotic Mid RCA to Dist RCA lesion was previously treated.   A drug-eluting stent was successfully placed.   Post intervention, there is a 0% residual stenosis.   Post intervention, there is a 0% residual stenosis.   There is moderate left ventricular systolic dysfunction. Acute inferolateral wall ST segment elevation secondary to total occlusion of a large left circumflex coronary artery with TIMI 0 flow.   Calcification involving the proximal LAD with 50 and 70% stenoses.   Widely patent dominant RCA with previously placed 4 tandem stents proximally to distally without restenosis.   Moderate acute LV dysfunction with EF estimated at 35 to 40% with hypocontractility involving the mid distal anterolateral wall.  LVEDP 30 mm   Successful percutaneous coronary intervention to the left circumflex coronary artery with ultimate insertion of a 2.75 x 26 mm Medtronic Onyx frontiers stent postdilated to 3.04 mm with the 100% occlusion being reduced to 0% and brisk TIMI-3 flow in the major vessel.  There is residual thrombus and a small marginal branch arising from the stented segment which was totally occluded initially.  There also was  faint thrombus in a very distal branch of the circumflex.  The plan is to continue Aggrastat for 18 hours post procedure.   RECOMMENDATION: DAPT indefinitely.  We will continue Aggrastat 18 hours postinfusion.  Optimize blood pressure.  Plan 2D echo Doppler study.  Continue carvedilol, consider ARB and transition to [facility] if LV function remains impaired.  Continue Praluent/Zetia with statin intolerance and probable familial hyperlipidemia.     TTE 04/24/2021   1. Septal , apical, lateral and posterior lateral hypokinesis . Left  ventricular ejection fraction, by estimation, is 30 to 35%. The left  ventricle has moderately decreased function. The left ventricle  demonstrates  regional wall motion abnormalities  (see scoring diagram/findings for description). The left ventricular  internal cavity size was moderately dilated. There is mild left  ventricular hypertrophy. Left ventricular diastolic parameters are  indeterminate.   2. Right ventricular systolic function is normal. The right ventricular  size is normal. There is moderately elevated pulmonary artery systolic  pressure.   3. Left atrial size was mildly dilated.   4. The mitral valve is abnormal. Moderate to severe mitral valve  regurgitation. No evidence of mitral stenosis. Severe mitral annular  calcification.   5. Tricuspid valve regurgitation is moderate.   6. The aortic valve is normal in structure. There is moderate  calcification of the aortic valve. There is moderate thickening of the  aortic valve. Aortic valve regurgitation is mild to moderate. Mild aortic  valve stenosis.   7. The inferior vena cava is normal in size with greater than 50%  respiratory variability, suggesting right atrial pressure of 3 mmHg.     Patient Profile     81 y.o. female with CAD status post PCI to the RCA, hypertension, CKD stage IV, anemia, diabetes, sleep apnea, carotid artery disease who was admitted 04/23/2021 for inferolateral STEMI.  Course has been complicated by severe mitral valve regurgitation, AKI, systolic heart failure and anemia.  Assessment & Plan    Inferolateral STEMI:  - IMI 04/23/2021 s/p DES CFX w/ med rs for 70% LAD - no ischemic sx - DAPT indefinitely per cath note - on Coreg 3.125 mg bid, BP limits titration - continue Praluent and Zetia (no statins)   Acute systolic heart failure:  -Initial EF 30-35%, was 40-45% on TEE -Was diuresed with IV Lasix initially, and a few days of no Lasix and oral Lasix, then started back on Lasix 40 mg IV twice daily -Diuresis limited by CKD and blood pressure -I's/O very incomplete with no urine output recorded for yesterday -No weights since 2/17, will  order, peak weight 154 pounds -No ACE/ARB/Arni/MRA given CKD stage IV. 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   GEN: No acute distress.   Cardiac: RRR, 4/6 holosystolic murmur +JVD Respiratory: Clear to auscultation bilaterally. GI: Soft, nontender, non-distended  MS: No edema; No deformity. Neuro:  Nonfocal  Vasc:  +2 radial pulses  Patient continues to be fairly symptomatic with dyspnea on exertion.  She can be a bit more supine than before but she is still relatively orthopneic.  Her creatinine however has improved.  Continue afterload reduction with hydralazine for goal blood pressure 90 to 110 mmHg.  We will continue 40 mg of Lasix IV twice daily.  We will give metolazone 2.5 mg p.o. x1.  I would like to diurese her and then refer her for right heart catheterization on Thursday.  I had the opportunity to review her imaging with our  structural heart disease section.  I think a repeat TEE in order to assess the feasibility of transcatheter mitral edge-to-edge repair.  She had a late presentation myocardial infarction as she started having chest pain Friday and did not present for PCI until Saturday.  For this reason it is unclear how or whether her inferolateral wall will recover any contractility.  It may be that MitraClip is her only option but again this will depend on repeat TEE imaging.  As this is a secondary MR patient with class IIIb pathology we will need heart failure opinion.  I will likely obtain this after right heart catheterization on Thursday.  Lenna Sciara, MD Pager [phone]

## 2021-05-10 NOTE — Progress Notes (Signed)
BP has been soft in 91/48-114/74 mmHg tonight. Hydralazine was held tonight. Pt is asymptomatic, sleeping comfortably tonight. Sinus rhythm on the monitor, HR 70s-80s, SPO2 96-98% on room air, afebrile. No complaints of chest pain , SOB or dizziness. No immediate distress. We will continue to monitor.   Kennyth Lose, RN

## 2021-05-10 NOTE — Plan of Care (Signed)
Plan of care reviewed. Pt is progressing. She is hemodynamically stable. No acute distress. We will monitor.   Problem: Clinical Measurements: Goal: Respiratory complications will improve Outcome: Progressing   Problem: Clinical Measurements: Goal: Cardiovascular complication will be avoided Outcome: Progressing   Problem: Activity: Goal: Risk for activity intolerance will decrease Outcome: Progressing   Problem: Elimination: Goal: Will not experience complications related to urinary retention Outcome: Progressing   Problem: Pain Managment: Goal: General experience of comfort will improve Outcome: Progressing   Kennyth Lose, RN

## 2021-05-11 ENCOUNTER — Other Ambulatory Visit (HOSPITAL_COMMUNITY): Payer: Self-pay

## 2021-05-11 DIAGNOSIS — I5023 Acute on chronic systolic (congestive) heart failure: Secondary | ICD-10-CM | POA: Diagnosis not present

## 2021-05-11 DIAGNOSIS — I2121 ST elevation (STEMI) myocardial infarction involving left circumflex coronary artery: Secondary | ICD-10-CM | POA: Diagnosis not present

## 2021-05-11 LAB — CBC
HCT: 30.7 % — ABNORMAL LOW (ref 36.0–46.0)
Hemoglobin: 9.5 g/dL — ABNORMAL LOW (ref 12.0–15.0)
MCH: 27 pg (ref 26.0–34.0)
MCHC: 30.9 g/dL (ref 30.0–36.0)
MCV: 87.2 fL (ref 80.0–100.0)
Platelets: 339 10*3/uL (ref 150–400)
RBC: 3.52 MIL/uL — ABNORMAL LOW (ref 3.87–5.11)
RDW: 21.5 % — ABNORMAL HIGH (ref 11.5–15.5)
WBC: 7.3 10*3/uL (ref 4.0–10.5)
nRBC: 0.7 % — ABNORMAL HIGH (ref 0.0–0.2)

## 2021-05-11 LAB — BASIC METABOLIC PANEL
Anion gap: 15 (ref 5–15)
BUN: 64 mg/dL — ABNORMAL HIGH (ref 8–23)
CO2: 23 mmol/L (ref 22–32)
Calcium: 8.6 mg/dL — ABNORMAL LOW (ref 8.9–10.3)
Chloride: 98 mmol/L (ref 98–111)
Creatinine, Ser: 2.18 mg/dL — ABNORMAL HIGH (ref 0.44–1.00)
GFR, Estimated: 23 mL/min — ABNORMAL LOW (ref >60)
Glucose, Bld: 113 mg/dL — ABNORMAL HIGH (ref 70–99)
Potassium: 3.6 mmol/L (ref 3.5–5.1)
Sodium: 136 mmol/L (ref 135–145)

## 2021-05-11 LAB — GLUCOSE, CAPILLARY
Glucose-Capillary: 132 mg/dL — ABNORMAL HIGH (ref 70–99)
Glucose-Capillary: 139 mg/dL — ABNORMAL HIGH (ref 70–99)
Glucose-Capillary: 225 mg/dL — ABNORMAL HIGH (ref 70–99)
Glucose-Capillary: 199 mg/dL — ABNORMAL HIGH (ref 70–99)

## 2021-05-11 LAB — MAGNESIUM: Magnesium: 2.3 mg/dL (ref 1.7–2.4)

## 2021-05-11 MED ORDER — ISOSORB DINITRATE-HYDRALAZINE 20-37.5 MG PO TABS: 1.0000 | ORAL_TABLET | Freq: Three times a day (TID) | ORAL | Status: DC

## 2021-05-11 MED ORDER — METOLAZONE 5 MG PO TABS: 2.5000 mg | ORAL_TABLET | Freq: Once | ORAL | Status: DC

## 2021-05-11 MED ORDER — METOPROLOL SUCCINATE ER 25 MG PO TB24
12.5000 mg | ORAL_TABLET | Freq: Every day | ORAL | Status: DC
  Administered 2021-05-11 – 2021-05-17 (×7): 12.5 mg via ORAL
  Filled 2021-05-11 (×7): qty 1

## 2021-05-11 MED ORDER — POTASSIUM CHLORIDE CRYS ER 20 MEQ PO TBCR
20.0000 meq | EXTENDED_RELEASE_TABLET | Freq: Once | ORAL | Status: AC
  Administered 2021-05-11: 20 meq via ORAL
  Filled 2021-05-11: qty 1

## 2021-05-11 MED ORDER — ISOSORB DINITRATE-HYDRALAZINE 20-37.5 MG PO TABS
1.0000 | ORAL_TABLET | Freq: Three times a day (TID) | ORAL | Status: DC
  Administered 2021-05-12 – 2021-05-17 (×13): 1 via ORAL
  Filled 2021-05-11 (×15): qty 1

## 2021-05-11 MED ORDER — HYDRALAZINE HCL 25 MG PO TABS
37.5000 mg | ORAL_TABLET | Freq: Three times a day (TID) | ORAL | Status: AC
  Administered 2021-05-11: 37.5 mg via ORAL
  Filled 2021-05-11: qty 2

## 2021-05-11 NOTE — Progress Notes (Signed)
Progress Note  Patient Name: Karen West Date of Encounter: 05/11/2021  [facility] Cardiologist: Minus Breeding, MD   Subjective   No acute events overnight.  Diuresed better.  Feels somewhat better but still dyspneic at times.  Inpatient Medications    Scheduled Meds:  aspirin EC  81 mg Oral Daily   carvedilol  3.125 mg Oral BID WC   enoxaparin (LOVENOX) injection  30 mg Subcutaneous Q24H   ezetimibe  10 mg Oral Daily   feeding supplement (GLUCERNA SHAKE)  237 mL Oral TID BM   ferrous sulfate  325 mg Oral BID WC   furosemide  40 mg Intravenous BID   hydrALAZINE  10 mg Oral Q8H   insulin aspart  0-15 Units Subcutaneous TID WC   isosorbide mononitrate  30 mg Oral Daily   levothyroxine  88 mcg Oral Q0600   pantoprazole  40 mg Oral Daily   potassium chloride  20 mEq Oral Once   sodium chloride flush  3 mL Intravenous Q12H   ticagrelor  90 mg Oral BID   Continuous Infusions:  sodium chloride Stopped (05/02/21 1428)   sodium chloride     sodium chloride     PRN Meds: sodium chloride, acetaminophen, diazepam, labetalol, nitroGLYCERIN, sodium chloride flush   Vital Signs    Vitals:   05/10/21 2221 05/11/21 0352 05/11/21 0616 05/11/21 0819  BP: 103/70 100/61  107/66  Pulse: 85 81 79 89  Resp: 20 20 20 20   Temp: 98.4 F (36.9 C) 97.7 F (36.5 C)  98.3 F (36.8 C)  TempSrc: Oral Oral  Oral  SpO2: 99% 97% 93% 97%  Weight:   66.1 kg   Height:        Intake/Output Summary (Last 24 hours) at 05/11/2021 1320 Last data filed at 05/11/2021 0934 Gross per 24 hour  Intake 490 ml  Output 1650 ml  Net -1160 ml   Last 3 Weights 05/11/2021 05/06/2021 05/05/2021  Weight (lbs) 145 lb 11.2 oz 149 lb 7.6 oz 149 lb 7.6 oz  Weight (kg) 66.089 kg 67.8 kg 67.8 kg      Telemetry    Sinus rhythm- Personally Reviewed  ECG    None today  Physical Exam   GEN: No acute distress.   Neck: No JVD Cardiac: RRR, 3/6 holosystolic murmur Respiratory: Clear to auscultation  bilaterally. GI: Soft, nontender, non-distended  MS: No edema; No deformity. Neuro:  Nonfocal  Psych: Normal affect   Labs    High Sensitivity Troponin:   Recent Labs  Lab 04/23/21 1000 04/23/21 1222  TROPONINIHS 22,485* >24,000*     Chemistry Recent Labs  Lab 05/05/21 0204 05/06/21 0127 05/09/21 0732 05/10/21 0345 05/11/21 0120  NA 134*   < > 140 138 136  K 3.9   < > 3.8 3.5 3.6  CL 99   < > 104 103 98  CO2 20*   < > 21* 20* 23  GLUCOSE 200*   < > 198* 151* 113*  BUN 65*   < > 66* 66* 64*  CREATININE 2.32*   < > 2.25* 2.14* 2.18*  CALCIUM 7.7*   < > 8.2* 8.0* 8.6*  MG  --   --   --  1.8 2.3  PROT 6.5  --   --   --   --   ALBUMIN 2.7*  --   --   --   --   AST 30  --   --   --   --  ALT 20  --   --   --   --   ALKPHOS 67  --   --   --   --   BILITOT 0.4  --   --   --   --   GFRNONAA 21*   < > 22* 23* 23*  ANIONGAP 15   < > 15 15 15    < > = values in this interval not displayed.    Lipids No results for input(s): CHOL, TRIG, HDL, LABVLDL, LDLCALC, CHOLHDL in the last 168 hours.  Hematology Recent Labs  Lab 05/09/21 0732 05/10/21 0345 05/11/21 0120  WBC 7.8 7.2 7.3  RBC 3.65* 3.39* 3.52*  HGB 10.0* 9.2* 9.5*  HCT 31.5* 29.8* 30.7*  MCV 86.3 87.9 87.2  MCH 27.4 27.1 27.0  MCHC 31.7 30.9 30.9  RDW 20.8* 21.2* 21.5*  PLT 360 318 339   Thyroid No results for input(s): TSH, FREET4 in the last 168 hours.  BNPNo results for input(s): BNP, PROBNP in the last 168 hours.  DDimer No results for input(s): DDIMER in the last 168 hours.   Radiology    No results found.  Cardiac Studies   LHC 04/23/2021    Prox LAD to Mid LAD lesion is 70% stenosed.   Prox LAD lesion is 50% stenosed.   Mid Cx lesion is 100% stenosed.   Dist Cx lesion is 80% stenosed.   3rd Mrg lesion is 100% stenosed.   Non-stenotic Dist RCA lesion was previously treated.   Non-stenotic Mid RCA to Dist RCA lesion was previously treated.   A drug-eluting stent was successfully placed.   Post  intervention, there is a 0% residual stenosis.   Post intervention, there is a 0% residual stenosis.   There is moderate left ventricular systolic dysfunction. Acute inferolateral wall ST segment elevation secondary to total occlusion of a large left circumflex coronary artery with TIMI 0 flow.   Calcification involving the proximal LAD with 50 and 70% stenoses.   Widely patent dominant RCA with previously placed 4 tandem stents proximally to distally without restenosis.   Moderate acute LV dysfunction with EF estimated at 35 to 40% with hypocontractility involving the mid distal anterolateral wall.  LVEDP 30 mm   Successful percutaneous coronary intervention to the left circumflex coronary artery with ultimate insertion of a 2.75 x 26 mm Medtronic Onyx frontiers stent postdilated to 3.04 mm with the 100% occlusion being reduced to 0% and brisk TIMI-3 flow in the major vessel.  There is residual thrombus and a small marginal branch arising from the stented segment which was totally occluded initially.  There also was faint thrombus in a very distal branch of the circumflex.  The plan is to continue Aggrastat for 18 hours post procedure.   RECOMMENDATION: DAPT indefinitely.  We will continue Aggrastat 18 hours postinfusion.  Optimize blood pressure.  Plan 2D echo Doppler study.  Continue carvedilol, consider ARB and transition to [facility] if LV function remains impaired.  Continue Praluent/Zetia with statin intolerance and probable familial hyperlipidemia.     TTE 04/24/2021   1. Septal , apical, lateral and posterior lateral hypokinesis . Left  ventricular ejection fraction, by estimation, is 30 to 35%. The left  ventricle has moderately decreased function. The left ventricle  demonstrates regional wall motion abnormalities  (see scoring diagram/findings for description). The left ventricular  internal cavity size was moderately dilated. There is mild left  ventricular hypertrophy. Left  ventricular diastolic parameters are  indeterminate.  2. Right ventricular systolic function is normal. The right ventricular  size is normal. There is moderately elevated pulmonary artery systolic  pressure.   3. Left atrial size was mildly dilated.   4. The mitral valve is abnormal. Moderate to severe mitral valve  regurgitation. No evidence of mitral stenosis. Severe mitral annular  calcification.   5. Tricuspid valve regurgitation is moderate.   6. The aortic valve is normal in structure. There is moderate  calcification of the aortic valve. There is moderate thickening of the  aortic valve. Aortic valve regurgitation is mild to moderate. Mild aortic  valve stenosis.   7. The inferior vena cava is normal in size with greater than 50%  respiratory variability, suggesting right atrial pressure of 3 mmHg.   Patient Profile     81 y.o. female with CAD status post PCI to the RCA, hypertension, CKD stage IV, anemia, diabetes, sleep apnea, carotid artery disease who was admitted 04/23/2021 for inferolateral STEMI.  Course has been complicated by severe mitral valve regurgitation, AKI, systolic heart failure and anemia.  Assessment & Plan     Acute on chronic systolic heart failure: This is due to I believe significant mitral regurgitation due to late presentation lateral infarction.  I would like to keep her well afterload reduced to promote forward flow.  We stop hydralazine and Imdur and start bidil.  Change to toprol xl 12.5mg  qpm.  Her GFR is low and I do not think she is a good candidate for an SGLT2 inhibitor right now.  Continue Lasix 40 IV twice daily and keep lites stable.    I had a long conversation with the patient.  We will plan on right heart catheterization on Friday.  I am hoping her BUN and creatinine may start bumping which would suggest that she is euvolemic if not dry.  At that time we will decide whether she is stable enough for discharge home.  If she is not we will keep  her in-house and plan for repeat TEE on Monday for the purposes of MitraClip evaluation.  After the right heart catheterization and depending on the patient's disposition will involve heart failure.  2.  Inferolateral ST elevation myocardial infarction: This is a late presentation.  The patient likely had some degree of mitral regurgitation prior to this and this late presentation MI has now compounded her likely chronic moderate mitral regurgitation.  We will continue DAPT.  We will change Toprol as above.  Continue current therapy for lipids.  3.  Severe mitral regurgitation: See discussion above.  I am hoping that she will be able to be discharged home but it may be that we will need to pursue intervention prior to discharge.  4.  Chronic kidney disease: Creatinine is stable continue to monitor for now.  For questions or updat 1.  Acute on chronic systolic heart failure.es, please contact [place] Please consult [email or web address] for contact info under        Signed, Early Osmond, MD  05/11/2021, 1:20 PM

## 2021-05-11 NOTE — Plan of Care (Signed)
Plan of care reviewed. Pt is progressing. Rested well toning, denied chest pain or SOB. No acute distress. Vital signs remain stable, afebrile. Lungs clear auscultated bilaterally. Heart sound has loud murmur. NSR on monitor with RRR.   Pt refused Hydralazine which scheduled for tonight. Medication necessity explained. Pt stated her blood pressure is already soft.     NPO after midnight. TEE informed consent was signed.    Urine output day shift 400 ml, night 1350 ml. Total 24 hours 1730 ml. We will monitor.  Problem: Clinical Measurements: Goal: Will remain free from infection Outcome: Progressing   Problem: Clinical Measurements: Goal: Respiratory complications will improve Outcome: Progressing   Problem: Clinical Measurements: Goal: Cardiovascular complication will be avoided Outcome: Progressing   Problem: Activity: Goal: Risk for activity intolerance will decrease Outcome: Progressing   Problem: Elimination: Goal: Will not experience complications related to urinary retention Outcome: Progressing   Problem: Elimination: Goal: Will not experience complications related to bowel motility Outcome: Progressing   Problem: Pain Managment: Goal: General experience of comfort will improve Outcome: Progressing   Kennyth Lose, RN

## 2021-05-11 NOTE — Progress Notes (Signed)
CARDIAC REHAB PHASE I   PRE:  Rate/Rhythm: 86 SR    BP: sitting 112/74    SaO2:   MODE:  Ambulation: 160 ft   POST:  Rate/Rhythm: 102 ST    BP: sitting 118/70     SaO2: 94 RA  Pt feeling some better, walking around the room holding to furniture, not as SOB after sitting. Ambulated with contact guard, no RW today. Rest at 80 ft against wall, significant SOB, which is her norm. Rest x1 min then returned back to room, again SOB after sitting.  [id number]   [place], ACSM 05/11/2021 11:49 AM

## 2021-05-11 NOTE — TOC Benefit Eligibility Note (Signed)
Patient Teacher, English as a foreign language completed.    The patient is currently admitted and upon discharge could be taking Bidil tablets.  Product Not on Formulary  The patient is insured through [place] Patient Advocate Specialist [place] Patient Advocate Team Direct Number: [phone]  Fax: [phone]

## 2021-05-12 DIAGNOSIS — I2121 ST elevation (STEMI) myocardial infarction involving left circumflex coronary artery: Secondary | ICD-10-CM | POA: Diagnosis not present

## 2021-05-12 DIAGNOSIS — I5023 Acute on chronic systolic (congestive) heart failure: Secondary | ICD-10-CM | POA: Diagnosis not present

## 2021-05-12 LAB — BASIC METABOLIC PANEL
Anion gap: 16 — ABNORMAL HIGH (ref 5–15)
BUN: 71 mg/dL — ABNORMAL HIGH (ref 8–23)
CO2: 24 mmol/L (ref 22–32)
Calcium: 8.8 mg/dL — ABNORMAL LOW (ref 8.9–10.3)
Chloride: 97 mmol/L — ABNORMAL LOW (ref 98–111)
Creatinine, Ser: 2.49 mg/dL — ABNORMAL HIGH (ref 0.44–1.00)
GFR, Estimated: 19 mL/min — ABNORMAL LOW (ref >60)
Glucose, Bld: 175 mg/dL — ABNORMAL HIGH (ref 70–99)
Potassium: 3.9 mmol/L (ref 3.5–5.1)
Sodium: 137 mmol/L (ref 135–145)

## 2021-05-12 LAB — GLUCOSE, CAPILLARY
Glucose-Capillary: 162 mg/dL — ABNORMAL HIGH (ref 70–99)
Glucose-Capillary: 248 mg/dL — ABNORMAL HIGH (ref 70–99)
Glucose-Capillary: 278 mg/dL — ABNORMAL HIGH (ref 70–99)
Glucose-Capillary: 162 mg/dL — ABNORMAL HIGH (ref 70–99)

## 2021-05-12 LAB — MAGNESIUM: Magnesium: 2.1 mg/dL (ref 1.7–2.4)

## 2021-05-12 MED ORDER — FUROSEMIDE 40 MG PO TABS
40.0000 mg | ORAL_TABLET | Freq: Every day | ORAL | Status: DC
  Administered 2021-05-13 – 2021-05-17 (×5): 40 mg via ORAL
  Filled 2021-05-12 (×5): qty 1

## 2021-05-12 MED ORDER — ASPIRIN 81 MG PO CHEW
81.0000 mg | CHEWABLE_TABLET | ORAL | Status: AC
  Administered 2021-05-13: 81 mg via ORAL
  Filled 2021-05-12: qty 1

## 2021-05-12 MED ORDER — ASPIRIN EC 81 MG PO TBEC
81.0000 mg | DELAYED_RELEASE_TABLET | Freq: Every day | ORAL | Status: DC
Start: 2021-05-14 — End: 2021-05-18
  Administered 2021-05-14 – 2021-05-18 (×5): 81 mg via ORAL
  Filled 2021-05-12 (×5): qty 1

## 2021-05-12 NOTE — Progress Notes (Signed)
Progress Note  Patient Name: Karen West Date of Encounter: 05/12/2021  [facility] Cardiologist: Minus Breeding, MD   Subjective   No acute events overnight.  Diuresed briskly overnight with mildly elevated BUN/CR.  Feels well without dyspnea with exertion.  Inpatient Medications    Scheduled Meds:  aspirin EC  81 mg Oral Daily   enoxaparin (LOVENOX) injection  30 mg Subcutaneous Q24H   ezetimibe  10 mg Oral Daily   feeding supplement (GLUCERNA SHAKE)  237 mL Oral TID BM   ferrous sulfate  325 mg Oral BID WC   [START ON 05/13/2021] furosemide  40 mg Oral Daily   insulin aspart  0-15 Units Subcutaneous TID WC   isosorbide-hydrALAZINE  1 tablet Oral TID   levothyroxine  88 mcg Oral Q0600   metoprolol succinate  12.5 mg Oral QHS   pantoprazole  40 mg Oral Daily   ticagrelor  90 mg Oral BID   Continuous Infusions:   PRN Meds: acetaminophen, diazepam, labetalol, nitroGLYCERIN   Vital Signs    Vitals:   05/11/21 1927 05/11/21 2312 05/12/21 0321 05/12/21 1000  BP: 118/69 103/60 (!) 108/58 120/78  Pulse: 90 85 85 86  Resp: (!) 21 20 18 14   Temp: (!) 97.4 F (36.3 C) 98.2 F (36.8 C) 97.6 F (36.4 C) 97.7 F (36.5 C)  TempSrc: Oral Oral Oral Oral  SpO2: 94% 100% 99% 96%  Weight:   65.6 kg   Height:        Intake/Output Summary (Last 24 hours) at 05/12/2021 1050 Last data filed at 05/12/2021 1000 Gross per 24 hour  Intake 360 ml  Output 2175 ml  Net -1815 ml    Last 3 Weights 05/12/2021 05/11/2021 05/06/2021  Weight (lbs) 144 lb 11.2 oz 145 lb 11.2 oz 149 lb 7.6 oz  Weight (kg) 65.635 kg 66.089 kg 67.8 kg      Telemetry    Sinus rhythm- Personally Reviewed  ECG    None today  Physical Exam   GEN: No acute distress.   Neck: No JVD Cardiac: RRR, 3/6 holosystolic murmur Respiratory: Clear to auscultation bilaterally. GI: Soft, nontender, non-distended  MS: No edema; No deformity. Neuro:  Nonfocal  Psych: Normal affect   Labs    High  Sensitivity Troponin:   Recent Labs  Lab 04/23/21 1000 04/23/21 1222  TROPONINIHS 22,485* >24,000*      Chemistry Recent Labs  Lab 05/10/21 0345 05/11/21 0120 05/12/21 0125  NA 138 136 137  K 3.5 3.6 3.9  CL 103 98 97*  CO2 20* 23 24  GLUCOSE 151* 113* 175*  BUN 66* 64* 71*  CREATININE 2.14* 2.18* 2.49*  CALCIUM 8.0* 8.6* 8.8*  MG 1.8 2.3 2.1  GFRNONAA 23* 23* 19*  ANIONGAP 15 15 16*     Lipids No results for input(s): CHOL, TRIG, HDL, LABVLDL, LDLCALC, CHOLHDL in the last 168 hours.  Hematology Recent Labs  Lab 05/09/21 0732 05/10/21 0345 05/11/21 0120  WBC 7.8 7.2 7.3  RBC 3.65* 3.39* 3.52*  HGB 10.0* 9.2* 9.5*  HCT 31.5* 29.8* 30.7*  MCV 86.3 87.9 87.2  MCH 27.4 27.1 27.0  MCHC 31.7 30.9 30.9  RDW 20.8* 21.2* 21.5*  PLT 360 318 339    Thyroid No results for input(s): TSH, FREET4 in the last 168 hours.  BNPNo results for input(s): BNP, PROBNP in the last 168 hours.  DDimer No results for input(s): DDIMER in the last 168 hours.   Radiology  No results found.  Cardiac Studies   LHC 04/23/2021    Prox LAD to Mid LAD lesion is 70% stenosed.   Prox LAD lesion is 50% stenosed.   Mid Cx lesion is 100% stenosed.   Dist Cx lesion is 80% stenosed.   3rd Mrg lesion is 100% stenosed.   Non-stenotic Dist RCA lesion was previously treated.   Non-stenotic Mid RCA to Dist RCA lesion was previously treated.   A drug-eluting stent was successfully placed.   Post intervention, there is a 0% residual stenosis.   Post intervention, there is a 0% residual stenosis.   There is moderate left ventricular systolic dysfunction. Acute inferolateral wall ST segment elevation secondary to total occlusion of a large left circumflex coronary artery with TIMI 0 flow.   Calcification involving the proximal LAD with 50 and 70% stenoses.   Widely patent dominant RCA with previously placed 4 tandem stents proximally to distally without restenosis.   Moderate acute LV  dysfunction with EF estimated at 35 to 40% with hypocontractility involving the mid distal anterolateral wall.  LVEDP 30 mm   Successful percutaneous coronary intervention to the left circumflex coronary artery with ultimate insertion of a 2.75 x 26 mm Medtronic Onyx frontiers stent postdilated to 3.04 mm with the 100% occlusion being reduced to 0% and brisk TIMI-3 flow in the major vessel.  There is residual thrombus and a small marginal branch arising from the stented segment which was totally occluded initially.  There also was faint thrombus in a very distal branch of the circumflex.  The plan is to continue Aggrastat for 18 hours post procedure.   RECOMMENDATION: DAPT indefinitely.  We will continue Aggrastat 18 hours postinfusion.  Optimize blood pressure.  Plan 2D echo Doppler study.  Continue carvedilol, consider ARB and transition to [facility] if LV function remains impaired.  Continue Praluent/Zetia with statin intolerance and probable familial hyperlipidemia.     TTE 04/24/2021   1. Septal , apical, lateral and posterior lateral hypokinesis . Left  ventricular ejection fraction, by estimation, is 30 to 35%. The left  ventricle has moderately decreased function. The left ventricle  demonstrates regional wall motion abnormalities  (see scoring diagram/findings for description). The left ventricular  internal cavity size was moderately dilated. There is mild left  ventricular hypertrophy. Left ventricular diastolic parameters are  indeterminate.   2. Right ventricular systolic function is normal. The right ventricular  size is normal. There is moderately elevated pulmonary artery systolic  pressure.   3. Left atrial size was mildly dilated.   4. The mitral valve is abnormal. Moderate to severe mitral valve  regurgitation. No evidence of mitral stenosis. Severe mitral annular  calcification.   5. Tricuspid valve regurgitation is moderate.   6. The aortic valve is normal in structure.  There is moderate  calcification of the aortic valve. There is moderate thickening of the  aortic valve. Aortic valve regurgitation is mild to moderate. Mild aortic  valve stenosis.   7. The inferior vena cava is normal in size with greater than 50%  respiratory variability, suggesting right atrial pressure of 3 mmHg.   Patient Profile     81 y.o. female with CAD status post PCI to the RCA, hypertension, CKD stage IV, anemia, diabetes, sleep apnea, carotid artery disease who was admitted 04/23/2021 for inferolateral STEMI.  Course has been complicated by severe mitral valve regurgitation, AKI, systolic heart failure and anemia.  Assessment & Plan     Acute on chronic systolic  heart failure: The patient's volume status has markedly improved.  We will continue BiDil, Toprol, and change Lasix to 40 mg p.o. daily.  We will refer for right heart catheterization tomorrow to assess volume status.  We will also assess her ability to ambulate without dyspnea tomorrow.  If she cannot then we will keep in house and plan for TEE next week as inpatient.  I will also likely get heart failure involved over the weekend.  If she feels well then possible discharge on p.o. diuretics with close structural heart follow-up.  Given her fluctuating creatinine I do not think she is a good candidate for either Entresto or losartan.  We will hold on spironolactone for now as well.  Heart rate is above goal for coronary artery disease but I think this is beneficial given her poor forward stroke-volume.  2.  Inferolateral ST elevation myocardial infarction: This is a late presentation.  The patient likely had some degree of mitral regurgitation prior to this and this late presentation MI has now compounded her likely chronic moderate mitral regurgitation.  We will continue DAPT.  Continue Toprol.  Continue current therapy for lipids.  3.  Severe mitral regurgitation: See discussion above.  I we will make decision about discharge  versus keeping in house for continued evaluation for possible mitral intervention.  4.  Chronic kidney disease: BUN and creatinine are mildly elevated: We will change to p.o. Lasix and monitor.  For questions or updat 1.  Acute on chronic systolic heart failure.es, please contact [place] Please consult [email or web address] for contact info under        Signed, Early Osmond, MD  05/12/2021, 10:50 AM

## 2021-05-12 NOTE — Progress Notes (Signed)
CARDIAC REHAB PHASE I   PRE:  Rate/Rhythm: 83 SR    BP: sitting 116/63    SaO2: 97 RA  MODE:  Ambulation: 170 ft   POST:  Rate/Rhythm: 108 ST    BP: sitting 134/69     SaO2: 97 RA  Pt ambulated without AD however unsteady at times with slight LOB therefore contact guard. Rested at 22 ft. Pt always has SOB however today she was distracted by the newspaper therefore did not gasp as she rested like she normally does. We talked then pt walked back to room. Eager to sit down and rest. Had pt converse to distract her as it seems if she notices her SOB, it is more exaggerated. She admits that she has had SOB for a long time at home but did not pay attention to it. H/o lung resection contributes. Left pt in recliner, eating lunch. [id number]   [place], ACSM 05/12/2021 2:03 PM

## 2021-05-12 NOTE — H&P (View-Only) (Signed)
Progress Note  Patient Name: Karen West Date of Encounter: 05/12/2021  [facility] Cardiologist: Minus Breeding, MD   Subjective   No acute events overnight.  Diuresed briskly overnight with mildly elevated BUN/CR.  Feels well without dyspnea with exertion.  Inpatient Medications    Scheduled Meds:  aspirin EC  81 mg Oral Daily   enoxaparin (LOVENOX) injection  30 mg Subcutaneous Q24H   ezetimibe  10 mg Oral Daily   feeding supplement (GLUCERNA SHAKE)  237 mL Oral TID BM   ferrous sulfate  325 mg Oral BID WC   [START ON 05/13/2021] furosemide  40 mg Oral Daily   insulin aspart  0-15 Units Subcutaneous TID WC   isosorbide-hydrALAZINE  1 tablet Oral TID   levothyroxine  88 mcg Oral Q0600   metoprolol succinate  12.5 mg Oral QHS   pantoprazole  40 mg Oral Daily   ticagrelor  90 mg Oral BID   Continuous Infusions:   PRN Meds: acetaminophen, diazepam, labetalol, nitroGLYCERIN   Vital Signs    Vitals:   05/11/21 1927 05/11/21 2312 05/12/21 0321 05/12/21 1000  BP: 118/69 103/60 (!) 108/58 120/78  Pulse: 90 85 85 86  Resp: (!) 21 20 18 14   Temp: (!) 97.4 F (36.3 C) 98.2 F (36.8 C) 97.6 F (36.4 C) 97.7 F (36.5 C)  TempSrc: Oral Oral Oral Oral  SpO2: 94% 100% 99% 96%  Weight:   65.6 kg   Height:        Intake/Output Summary (Last 24 hours) at 05/12/2021 1050 Last data filed at 05/12/2021 1000 Gross per 24 hour  Intake 360 ml  Output 2175 ml  Net -1815 ml    Last 3 Weights 05/12/2021 05/11/2021 05/06/2021  Weight (lbs) 144 lb 11.2 oz 145 lb 11.2 oz 149 lb 7.6 oz  Weight (kg) 65.635 kg 66.089 kg 67.8 kg      Telemetry    Sinus rhythm- Personally Reviewed  ECG    None today  Physical Exam   GEN: No acute distress.   Neck: No JVD Cardiac: RRR, 3/6 holosystolic murmur Respiratory: Clear to auscultation bilaterally. GI: Soft, nontender, non-distended  MS: No edema; No deformity. Neuro:  Nonfocal  Psych: Normal affect   Labs    High  Sensitivity Troponin:   Recent Labs  Lab 04/23/21 1000 04/23/21 1222  TROPONINIHS 22,485* >24,000*      Chemistry Recent Labs  Lab 05/10/21 0345 05/11/21 0120 05/12/21 0125  NA 138 136 137  K 3.5 3.6 3.9  CL 103 98 97*  CO2 20* 23 24  GLUCOSE 151* 113* 175*  BUN 66* 64* 71*  CREATININE 2.14* 2.18* 2.49*  CALCIUM 8.0* 8.6* 8.8*  MG 1.8 2.3 2.1  GFRNONAA 23* 23* 19*  ANIONGAP 15 15 16*     Lipids No results for input(s): CHOL, TRIG, HDL, LABVLDL, LDLCALC, CHOLHDL in the last 168 hours.  Hematology Recent Labs  Lab 05/09/21 0732 05/10/21 0345 05/11/21 0120  WBC 7.8 7.2 7.3  RBC 3.65* 3.39* 3.52*  HGB 10.0* 9.2* 9.5*  HCT 31.5* 29.8* 30.7*  MCV 86.3 87.9 87.2  MCH 27.4 27.1 27.0  MCHC 31.7 30.9 30.9  RDW 20.8* 21.2* 21.5*  PLT 360 318 339    Thyroid No results for input(s): TSH, FREET4 in the last 168 hours.  BNPNo results for input(s): BNP, PROBNP in the last 168 hours.  DDimer No results for input(s): DDIMER in the last 168 hours.   Radiology  No results found.  Cardiac Studies   LHC 04/23/2021    Prox LAD to Mid LAD lesion is 70% stenosed.   Prox LAD lesion is 50% stenosed.   Mid Cx lesion is 100% stenosed.   Dist Cx lesion is 80% stenosed.   3rd Mrg lesion is 100% stenosed.   Non-stenotic Dist RCA lesion was previously treated.   Non-stenotic Mid RCA to Dist RCA lesion was previously treated.   A drug-eluting stent was successfully placed.   Post intervention, there is a 0% residual stenosis.   Post intervention, there is a 0% residual stenosis.   There is moderate left ventricular systolic dysfunction. Acute inferolateral wall ST segment elevation secondary to total occlusion of a large left circumflex coronary artery with TIMI 0 flow.   Calcification involving the proximal LAD with 50 and 70% stenoses.   Widely patent dominant RCA with previously placed 4 tandem stents proximally to distally without restenosis.   Moderate acute LV  dysfunction with EF estimated at 35 to 40% with hypocontractility involving the mid distal anterolateral wall.  LVEDP 30 mm   Successful percutaneous coronary intervention to the left circumflex coronary artery with ultimate insertion of a 2.75 x 26 mm Medtronic Onyx frontiers stent postdilated to 3.04 mm with the 100% occlusion being reduced to 0% and brisk TIMI-3 flow in the major vessel.  There is residual thrombus and a small marginal branch arising from the stented segment which was totally occluded initially.  There also was faint thrombus in a very distal branch of the circumflex.  The plan is to continue Aggrastat for 18 hours post procedure.   RECOMMENDATION: DAPT indefinitely.  We will continue Aggrastat 18 hours postinfusion.  Optimize blood pressure.  Plan 2D echo Doppler study.  Continue carvedilol, consider ARB and transition to [facility] if LV function remains impaired.  Continue Praluent/Zetia with statin intolerance and probable familial hyperlipidemia.     TTE 04/24/2021   1. Septal , apical, lateral and posterior lateral hypokinesis . Left  ventricular ejection fraction, by estimation, is 30 to 35%. The left  ventricle has moderately decreased function. The left ventricle  demonstrates regional wall motion abnormalities  (see scoring diagram/findings for description). The left ventricular  internal cavity size was moderately dilated. There is mild left  ventricular hypertrophy. Left ventricular diastolic parameters are  indeterminate.   2. Right ventricular systolic function is normal. The right ventricular  size is normal. There is moderately elevated pulmonary artery systolic  pressure.   3. Left atrial size was mildly dilated.   4. The mitral valve is abnormal. Moderate to severe mitral valve  regurgitation. No evidence of mitral stenosis. Severe mitral annular  calcification.   5. Tricuspid valve regurgitation is moderate.   6. The aortic valve is normal in structure.  There is moderate  calcification of the aortic valve. There is moderate thickening of the  aortic valve. Aortic valve regurgitation is mild to moderate. Mild aortic  valve stenosis.   7. The inferior vena cava is normal in size with greater than 50%  respiratory variability, suggesting right atrial pressure of 3 mmHg.   Patient Profile     81 y.o. female with CAD status post PCI to the RCA, hypertension, CKD stage IV, anemia, diabetes, sleep apnea, carotid artery disease who was admitted 04/23/2021 for inferolateral STEMI.  Course has been complicated by severe mitral valve regurgitation, AKI, systolic heart failure and anemia.  Assessment & Plan     Acute on chronic systolic  heart failure: The patient's volume status has markedly improved.  We will continue BiDil, Toprol, and change Lasix to 40 mg p.o. daily.  We will refer for right heart catheterization tomorrow to assess volume status.  We will also assess her ability to ambulate without dyspnea tomorrow.  If she cannot then we will keep in house and plan for TEE next week as inpatient.  I will also likely get heart failure involved over the weekend.  If she feels well then possible discharge on p.o. diuretics with close structural heart follow-up.  Given her fluctuating creatinine I do not think she is a good candidate for either Entresto or losartan.  We will hold on spironolactone for now as well.  Heart rate is above goal for coronary artery disease but I think this is beneficial given her poor forward stroke-volume.  2.  Inferolateral ST elevation myocardial infarction: This is a late presentation.  The patient likely had some degree of mitral regurgitation prior to this and this late presentation MI has now compounded her likely chronic moderate mitral regurgitation.  We will continue DAPT.  Continue Toprol.  Continue current therapy for lipids.  3.  Severe mitral regurgitation: See discussion above.  I we will make decision about discharge  versus keeping in house for continued evaluation for possible mitral intervention.  4.  Chronic kidney disease: BUN and creatinine are mildly elevated: We will change to p.o. Lasix and monitor.  For questions or updat 1.  Acute on chronic systolic heart failure.es, please contact [place] Please consult [email or web address] for contact info under        Signed, Early Osmond, MD  05/12/2021, 10:50 AM

## 2021-05-12 NOTE — Progress Notes (Signed)
Occupational Therapy Treatment Patient Details Name: Karen West MRN: [id number] DOB: Dec 09, 1940 Today's Date: 05/12/2021   History of present illness Karen West is a 81 y.o. female admitted 04/23/21 with STEMI. Coronary/Graft Acute MI Revascularization (04/23/2021); LEFT HEART CATH AND CORONARY ANGIOGRAPHY (04/23/2021); and TEE without cardioversion (05/02/2021) PMH includes CAD s/p DES to RCA in 2021, severe MAC, HTN, HLD, multinodular thyroid s/p thyroidectomy in 2019, anemia, GERD, known RBBB, DM2 (uncontrolled), OSA, carotid artery disease with 50-69% stenosis bilaterally, breast cancer, and lung cancer s/p LVATS 2019   OT comments  Pt in bathroom upon entry, min guard/supervision for sink level ADL, Pt DOE and fatigues quickly requiring seated position for 2/3 grooming tasks. Pt then ambulated to chair min guard/supervision without DME - but she does reach for environmental support with balance. Reviewed energy conservation education. OT will continue to follow acutely for activity tolerance, and ADL/transfer.   Recommendations for follow up therapy are one component of a multi-disciplinary discharge planning process, led by the attending physician.  Recommendations may be updated based on patient status, additional functional criteria and insurance authorization.    Follow Up Recommendations  Home health OT    Assistance Recommended at Discharge Intermittent Supervision/Assistance  Patient can return home with the following  A little help with walking and/or transfers;A little help with bathing/dressing/bathroom;Assistance with cooking/housework;Assist for transportation;Help with stairs or ramp for entrance   Equipment Recommendations   (Pt has appropriate DME)    Recommendations for Other Services PT consult    Precautions / Restrictions Precautions Precautions: Fall Precaution Comments: watch O2 Restrictions Weight Bearing Restrictions: No       Mobility Bed Mobility                General bed mobility comments: in bathroom upon arrival    Transfers Overall transfer level: Needs assistance Equipment used: None Transfers: Sit to/from Stand Sit to Stand: Supervision           General transfer comment: Stood from toilet     Balance Overall balance assessment: Mild deficits observed, not formally tested                                         ADL either performed or assessed with clinical judgement   ADL Overall ADL's : Needs assistance/impaired     Grooming: Wash/dry hands;Min guard;Standing;Oral care;Wash/dry face;Sitting Grooming Details (indicate cue type and reason): fatigues quickly, seated rest break after oral care - then performed fac and hand washing in seated position         Upper Body Dressing : Set up;Sitting Upper Body Dressing Details (indicate cue type and reason): donning extra gown as robe     Toilet Transfer: Min guard;Ambulation   Toileting- Clothing Manipulation and Hygiene: Set up;Sitting/lateral lean       Functional mobility during ADLs: Min guard (reaches out for environmental support)      Extremity/Trunk Assessment Upper Extremity Assessment Upper Extremity Assessment: Generalized weakness   Lower Extremity Assessment Lower Extremity Assessment: Defer to PT evaluation        Vision   Vision Assessment?: No apparent visual deficits   Perception     Praxis      Cognition Arousal/Alertness: Awake/alert Behavior During Therapy: WFL for tasks assessed/performed Overall Cognitive Status: Within Functional Limits for tasks assessed  Exercises      Shoulder Instructions       General Comments DOE3/4 with activity    Pertinent Vitals/ Pain       Pain Assessment Pain Assessment: Faces Faces Pain Scale: Hurts a little bit Pain Location: generalized Pain Intervention(s): Limited activity within patient's  tolerance, Monitored during session, Repositioned  Home Living                                          Prior Functioning/Environment              Frequency  Min 2X/week        Progress Toward Goals  OT Goals(current goals can now be found in the care plan section)  Progress towards OT goals: Progressing toward goals  Acute Rehab OT Goals Patient Stated Goal: to get home OT Goal Formulation: With patient Time For Goal Achievement: 05/19/21 Potential to Achieve Goals: Good ADL Goals Pt Will Perform Grooming: with modified independence;standing Pt Will Perform Upper Body Dressing: with modified independence;sitting Pt Will Perform Lower Body Dressing: with modified independence;sit to/from stand Pt Will Transfer to Toilet: with modified independence;ambulating Pt Will Perform Toileting - Clothing Manipulation and hygiene: with modified independence;sit to/from stand Additional ADL Goal #1: Pt will verbalize 3 strategies for energy conservation to use for ADL/IADL with no cues  Plan Discharge plan remains appropriate    Co-evaluation                 AM-PAC OT "6 Clicks" Daily Activity     Outcome Measure   Help from another person eating meals?: None Help from another person taking care of personal grooming?: None Help from another person toileting, which includes using toliet, bedpan, or urinal?: A Little Help from another person bathing (including washing, rinsing, drying)?: A Little Help from another person to put on and taking off regular upper body clothing?: None Help from another person to put on and taking off regular lower body clothing?: A Little 6 Click Score: 21    End of Session    OT Visit Diagnosis: Other abnormalities of gait and mobility (R26.89);Muscle weakness (generalized) (M62.81)   Activity Tolerance Patient tolerated treatment well   Patient Left in chair;with call bell/phone within reach;with nursing/sitter in  room (going to get new IV)   Nurse Communication Mobility status        Time: 4270-6237 OT Time Calculation (min): 21 min  Charges: OT General Charges $OT Visit: 1 Visit OT Treatments $Self Care/Home Management : 8-22 mins  Karen West OTR/L Acute Rehabilitation Services Pager: [phone] Office: [place] 05/12/2021, 11:45 AM

## 2021-05-12 NOTE — Progress Notes (Signed)
This patient is not going for heart cath until 2/24. D/t R arm restrictions and limited access, instructed nurse to have consult placed closer to time of heart cath. Nurse VU. Fran Lowes RN VAST

## 2021-05-13 ENCOUNTER — Encounter (HOSPITAL_COMMUNITY): Payer: Self-pay | Admitting: Internal Medicine

## 2021-05-13 ENCOUNTER — Inpatient Hospital Stay (HOSPITAL_COMMUNITY)
Admission: EM | Disposition: A | Discharge status: Home Health | Payer: Self-pay | Source: Home / Self Care | Attending: Cardiovascular Disease

## 2021-05-13 DIAGNOSIS — I340 Nonrheumatic mitral (valve) insufficiency: Secondary | ICD-10-CM | POA: Diagnosis not present

## 2021-05-13 DIAGNOSIS — I5023 Acute on chronic systolic (congestive) heart failure: Secondary | ICD-10-CM | POA: Diagnosis not present

## 2021-05-13 DIAGNOSIS — I2121 ST elevation (STEMI) myocardial infarction involving left circumflex coronary artery: Secondary | ICD-10-CM | POA: Diagnosis not present

## 2021-05-13 HISTORY — PX: RIGHT HEART CATH: CATH118263

## 2021-05-13 LAB — BASIC METABOLIC PANEL
Anion gap: 13 (ref 5–15)
BUN: 79 mg/dL — ABNORMAL HIGH (ref 8–23)
CO2: 23 mmol/L (ref 22–32)
Calcium: 8.4 mg/dL — ABNORMAL LOW (ref 8.9–10.3)
Chloride: 99 mmol/L (ref 98–111)
Creatinine, Ser: 2.4 mg/dL — ABNORMAL HIGH (ref 0.44–1.00)
GFR, Estimated: 20 mL/min — ABNORMAL LOW (ref >60)
Glucose, Bld: 162 mg/dL — ABNORMAL HIGH (ref 70–99)
Potassium: 3.6 mmol/L (ref 3.5–5.1)
Sodium: 135 mmol/L (ref 135–145)

## 2021-05-13 LAB — MAGNESIUM: Magnesium: 2 mg/dL (ref 1.7–2.4)

## 2021-05-13 LAB — GLUCOSE, CAPILLARY
Glucose-Capillary: 172 mg/dL — ABNORMAL HIGH (ref 70–99)
Glucose-Capillary: 185 mg/dL — ABNORMAL HIGH (ref 70–99)
Glucose-Capillary: 264 mg/dL — ABNORMAL HIGH (ref 70–99)
Glucose-Capillary: 232 mg/dL — ABNORMAL HIGH (ref 70–99)

## 2021-05-13 SURGERY — RIGHT HEART CATH
Anesthesia: LOCAL

## 2021-05-13 MED ORDER — FENTANYL CITRATE (PF) 100 MCG/2ML IJ SOLN
INTRAMUSCULAR | Status: AC
  Filled 2021-05-13: qty 2

## 2021-05-13 MED ORDER — MIDAZOLAM HCL 2 MG/2ML IJ SOLN
INTRAMUSCULAR | Status: AC
  Filled 2021-05-13: qty 2

## 2021-05-13 MED ORDER — MIDAZOLAM HCL 2 MG/2ML IJ SOLN
INTRAMUSCULAR | Status: DC | PRN
  Administered 2021-05-13: 1 mg via INTRAVENOUS

## 2021-05-13 MED ORDER — SODIUM CHLORIDE 0.9% FLUSH: 3.0000 mL | Freq: Two times a day (BID) | INTRAVENOUS | Status: DC

## 2021-05-13 MED ORDER — LIDOCAINE HCL (PF) 1 % IJ SOLN
INTRAMUSCULAR | Status: AC
  Filled 2021-05-13: qty 30

## 2021-05-13 MED ORDER — HEPARIN (PORCINE) IN NACL 1000-0.9 UT/500ML-% IV SOLN
INTRAVENOUS | Status: DC | PRN
  Administered 2021-05-13: 500 mL

## 2021-05-13 MED ORDER — HEPARIN (PORCINE) IN NACL 1000-0.9 UT/500ML-% IV SOLN
INTRAVENOUS | Status: AC
  Filled 2021-05-13: qty 1000

## 2021-05-13 MED ORDER — POTASSIUM CHLORIDE CRYS ER 20 MEQ PO TBCR
20.0000 meq | EXTENDED_RELEASE_TABLET | Freq: Once | ORAL | Status: AC
  Administered 2021-05-13: 20 meq via ORAL
  Filled 2021-05-13: qty 1

## 2021-05-13 MED ORDER — FENTANYL CITRATE (PF) 100 MCG/2ML IJ SOLN
INTRAMUSCULAR | Status: DC | PRN
  Administered 2021-05-13: 25 ug via INTRAVENOUS

## 2021-05-13 MED ORDER — SODIUM CHLORIDE 0.9 % IV SOLN: INTRAVENOUS | Status: DC

## 2021-05-13 MED ORDER — SODIUM CHLORIDE 0.9% FLUSH: 3.0000 mL | INTRAVENOUS | Status: DC | PRN

## 2021-05-13 MED ORDER — LIDOCAINE HCL (PF) 1 % IJ SOLN
INTRAMUSCULAR | Status: DC | PRN
  Administered 2021-05-13: 2 mL via INTRADERMAL

## 2021-05-13 MED ORDER — SODIUM CHLORIDE 0.9 % IV SOLN: 250.0000 mL | INTRAVENOUS | Status: DC | PRN

## 2021-05-13 SURGICAL SUPPLY — 11 items
CATH SWAN GANZ 7F STRAIGHT (CATHETERS) ×1 IMPLANT
GUIDEWIRE .025 260CM (WIRE) ×1 IMPLANT
PACK CARDIAC CATHETERIZATION (CUSTOM PROCEDURE TRAY) ×2 IMPLANT
PROTECTION STATION PRESSURIZED (MISCELLANEOUS) ×2
SHEATH GLIDE SLENDER 4/5FR (SHEATH) IMPLANT
SHEATH PINNACLE 7F 10CM (SHEATH) ×1 IMPLANT
SHEATH PROBE COVER 6X72 (BAG) ×1 IMPLANT
STATION PROTECTION PRESSURIZED (MISCELLANEOUS) IMPLANT
TRANSDUCER W/STOPCOCK (MISCELLANEOUS) ×2 IMPLANT
TUBING ART PRESS 72  MALE/MALE (TUBING) ×1 IMPLANT
WIRE MICRO SET SILHO 5FR 7 (SHEATH) IMPLANT

## 2021-05-13 NOTE — Interval H&P Note (Signed)
History and Physical Interval Note:  05/13/2021 7:33 AM  Karen West  has presented today for surgery, with the diagnosis of mitral regurgitation The various methods of treatment have been discussed with the patient and family. After consideration of risks, benefits and other options for treatment, the patient has consented to  Procedure(s): RIGHT HEART CATH (N/A) as a surgical intervention.  The patient's history has been reviewed, patient examined, no change in status, stable for surgery.  I have reviewed the patient's chart and labs.  Questions were answered to the patient's satisfaction.     Keyira Mondesir

## 2021-05-13 NOTE — Progress Notes (Signed)
Pt with family currently. Declines ambulation. Sts she will ambulate later with staff. Feeling well. Encouraged PLB when she walks. [place], ACSM 3:07 PM 05/13/2021

## 2021-05-13 NOTE — Progress Notes (Signed)
Mobility Specialist Criteria Algorithm Info. ° ° 05/13/21 1500  °Mobility  °Activity Ambulated with assistance in hallway °(in chair before and after ambulation)  °Range of Motion/Exercises Active;All extremities  °Level of Assistance Contact guard assist, steadying assist  °Assistive Device None (Refused AD)  °Distance Ambulated (ft) 240 ft  °Activity Response Tolerated well °(x1 standing rest break)  ° °Patient received in chair, initially reluctant to participate but agreed with encouragement from family members. Ambulated in hallway min guard with slow steady gait. Required standing rest break x1 secondary to fatigue and SOB. Returned to room without complaint or incident, was left in recliner chair with all needs met, call bell in reach. ° °05/13/2021 °3:52 PM ° ° , CMS, BS EXP °Acute Rehabilitation Services  °Phone:[phone] °Office: [phone] ° °

## 2021-05-13 NOTE — Progress Notes (Signed)
Progress Note  Patient Name: Karen West Date of Encounter: 05/13/2021  [facility] Cardiologist: Minus Breeding, MD   Subjective   No acute events overnight.  RHC with RA 3 and V waves to 77mmHg over respiratory cycle.  Says she feels ok.  Able to ambulate without much dyspnea.  Inpatient Medications    Scheduled Meds:  [START ON 05/14/2021] aspirin EC  81 mg Oral Daily   enoxaparin (LOVENOX) injection  30 mg Subcutaneous Q24H   ezetimibe  10 mg Oral Daily   feeding supplement (GLUCERNA SHAKE)  237 mL Oral TID BM   ferrous sulfate  325 mg Oral BID WC   furosemide  40 mg Oral Daily   insulin aspart  0-15 Units Subcutaneous TID WC   isosorbide-hydrALAZINE  1 tablet Oral TID   levothyroxine  88 mcg Oral Q0600   metoprolol succinate  12.5 mg Oral QHS   pantoprazole  40 mg Oral Daily   potassium chloride  20 mEq Oral Once   ticagrelor  90 mg Oral BID   Continuous Infusions:   PRN Meds: acetaminophen, diazepam, labetalol, nitroGLYCERIN   Vital Signs    Vitals:   05/13/21 0750 05/13/21 0755 05/13/21 0800 05/13/21 0814  BP: 137/74 (!) 143/60 (!) 133/56 116/74  Pulse: 89 88 85 82  Resp: (!) 22 18 18 17   Temp:    97.7 F (36.5 C)  TempSrc:    Oral  SpO2: 98% 99% 95% 100%  Weight:      Height:        Intake/Output Summary (Last 24 hours) at 05/13/2021 0953 Last data filed at 05/13/2021 0540 Gross per 24 hour  Intake 140 ml  Output 1725 ml  Net -1585 ml    Last 3 Weights 05/13/2021 05/12/2021 05/12/2021  Weight (lbs) 145 lb 8.1 oz 145 lb 4.5 oz 144 lb 11.2 oz  Weight (kg) 66 kg 65.9 kg 65.635 kg      Telemetry    Sinus rhythm- Personally Reviewed  ECG    None today  Physical Exam   GEN: No acute distress.   Neck: No JVD Cardiac: RRR, 3/6 holosystolic murmur Respiratory: Clear to auscultation bilaterally. GI: Soft, nontender, non-distended  MS: No edema; No deformity. Neuro:  Nonfocal  Psych: Normal affect   Labs    High Sensitivity  Troponin:   Recent Labs  Lab 04/23/21 1000 04/23/21 1222  TROPONINIHS 22,485* >24,000*      Chemistry Recent Labs  Lab 05/11/21 0120 05/12/21 0125 05/13/21 0150  NA 136 137 135  K 3.6 3.9 3.6  CL 98 97* 99  CO2 23 24 23   GLUCOSE 113* 175* 162*  BUN 64* 71* 79*  CREATININE 2.18* 2.49* 2.40*  CALCIUM 8.6* 8.8* 8.4*  MG 2.3 2.1 2.0  GFRNONAA 23* 19* 20*  ANIONGAP 15 16* 13     Lipids No results for input(s): CHOL, TRIG, HDL, LABVLDL, LDLCALC, CHOLHDL in the last 168 hours.  Hematology Recent Labs  Lab 05/09/21 0732 05/10/21 0345 05/11/21 0120  WBC 7.8 7.2 7.3  RBC 3.65* 3.39* 3.52*  HGB 10.0* 9.2* 9.5*  HCT 31.5* 29.8* 30.7*  MCV 86.3 87.9 87.2  MCH 27.4 27.1 27.0  MCHC 31.7 30.9 30.9  RDW 20.8* 21.2* 21.5*  PLT 360 318 339    Thyroid No results for input(s): TSH, FREET4 in the last 168 hours.  BNPNo results for input(s): BNP, PROBNP in the last 168 hours.  DDimer No results for input(s): DDIMER in the  last 168 hours.   Radiology    No results found.  Cardiac Studies   LHC 04/23/2021    Prox LAD to Mid LAD lesion is 70% stenosed.   Prox LAD lesion is 50% stenosed.   Mid Cx lesion is 100% stenosed.   Dist Cx lesion is 80% stenosed.   3rd Mrg lesion is 100% stenosed.   Non-stenotic Dist RCA lesion was previously treated.   Non-stenotic Mid RCA to Dist RCA lesion was previously treated.   A drug-eluting stent was successfully placed.   Post intervention, there is a 0% residual stenosis.   Post intervention, there is a 0% residual stenosis.   There is moderate left ventricular systolic dysfunction. Acute inferolateral wall ST segment elevation secondary to total occlusion of a large left circumflex coronary artery with TIMI 0 flow.   Calcification involving the proximal LAD with 50 and 70% stenoses.   Widely patent dominant RCA with previously placed 4 tandem stents proximally to distally without restenosis.   Moderate acute LV dysfunction with EF  estimated at 35 to 40% with hypocontractility involving the mid distal anterolateral wall.  LVEDP 30 mm   Successful percutaneous coronary intervention to the left circumflex coronary artery with ultimate insertion of a 2.75 x 26 mm Medtronic Onyx frontiers stent postdilated to 3.04 mm with the 100% occlusion being reduced to 0% and brisk TIMI-3 flow in the major vessel.  There is residual thrombus and a small marginal branch arising from the stented segment which was totally occluded initially.  There also was faint thrombus in a very distal branch of the circumflex.  The plan is to continue Aggrastat for 18 hours post procedure.   RECOMMENDATION: DAPT indefinitely.  We will continue Aggrastat 18 hours postinfusion.  Optimize blood pressure.  Plan 2D echo Doppler study.  Continue carvedilol, consider ARB and transition to [facility] if LV function remains impaired.  Continue Praluent/Zetia with statin intolerance and probable familial hyperlipidemia.     TTE 04/24/2021   1. Septal , apical, lateral and posterior lateral hypokinesis . Left  ventricular ejection fraction, by estimation, is 30 to 35%. The left  ventricle has moderately decreased function. The left ventricle  demonstrates regional wall motion abnormalities  (see scoring diagram/findings for description). The left ventricular  internal cavity size was moderately dilated. There is mild left  ventricular hypertrophy. Left ventricular diastolic parameters are  indeterminate.   2. Right ventricular systolic function is normal. The right ventricular  size is normal. There is moderately elevated pulmonary artery systolic  pressure.   3. Left atrial size was mildly dilated.   4. The mitral valve is abnormal. Moderate to severe mitral valve  regurgitation. No evidence of mitral stenosis. Severe mitral annular  calcification.   5. Tricuspid valve regurgitation is moderate.   6. The aortic valve is normal in structure. There is moderate   calcification of the aortic valve. There is moderate thickening of the  aortic valve. Aortic valve regurgitation is mild to moderate. Mild aortic  valve stenosis.   7. The inferior vena cava is normal in size with greater than 50%  respiratory variability, suggesting right atrial pressure of 3 mmHg.   Patient Profile     81 y.o. female with CAD status post PCI to the RCA, hypertension, CKD stage IV, anemia, diabetes, sleep apnea, carotid artery disease who was admitted 04/23/2021 for inferolateral STEMI.  Course has been complicated by severe mitral valve regurgitation, AKI, systolic heart failure and anemia.  Assessment &  Plan     Acute on chronic systolic heart failure:  Patient clinically improved but I am quite concerned given her severe MR.  She remains tenous.  I think it best to keep her in house, obtain Mitraclip specific TEE on Monday to assess feasibility of edge to edge repair (prior TEE with limited assessment of posterior leaflet).  Cont PO lasix, BiDil, Toprol.  If Cr improves, could consider losartan vs Entresto + spironolactone but I am doubtful.  Discussed with Dr. Haroldine Laws, CHF to manage over the weekend.  My sense is she will need intervention prior to (or closely after) discharge.  2.  Inferolateral ST elevation myocardial infarction:  Late presentation STEMI that has compounded her chronic moderate mitral regurgitation.  We will continue DAPT.  Continue Toprol.  HR 80s which is ok given her degree of MR.  Continue current therapy for lipids.  3.  Severe mitral regurgitation: She is tenuous. Will keep in house and obtain repeat TEE Monday for Mitraclip purposes.  4.  Chronic kidney disease: BUN slightly elevated but bicarb ok.  Cont PO lasix 40 Qday.  For questions or updat 1.  Acute on chronic systolic heart failure.es, please contact [place] Please consult [email or web address] for contact info under        Signed, Early Osmond, MD  05/13/2021, 9:53 AM

## 2021-05-14 DIAGNOSIS — I5023 Acute on chronic systolic (congestive) heart failure: Secondary | ICD-10-CM | POA: Diagnosis not present

## 2021-05-14 LAB — BASIC METABOLIC PANEL
Anion gap: 13 (ref 5–15)
BUN: 68 mg/dL — ABNORMAL HIGH (ref 8–23)
CO2: 23 mmol/L (ref 22–32)
Calcium: 8.5 mg/dL — ABNORMAL LOW (ref 8.9–10.3)
Chloride: 102 mmol/L (ref 98–111)
Creatinine, Ser: 2.11 mg/dL — ABNORMAL HIGH (ref 0.44–1.00)
GFR, Estimated: 24 mL/min — ABNORMAL LOW (ref >60)
Glucose, Bld: 147 mg/dL — ABNORMAL HIGH (ref 70–99)
Potassium: 3.5 mmol/L (ref 3.5–5.1)
Sodium: 138 mmol/L (ref 135–145)

## 2021-05-14 LAB — GLUCOSE, CAPILLARY
Glucose-Capillary: 170 mg/dL — ABNORMAL HIGH (ref 70–99)
Glucose-Capillary: 179 mg/dL — ABNORMAL HIGH (ref 70–99)
Glucose-Capillary: 179 mg/dL — ABNORMAL HIGH (ref 70–99)
Glucose-Capillary: 250 mg/dL — ABNORMAL HIGH (ref 70–99)

## 2021-05-14 LAB — MAGNESIUM: Magnesium: 1.8 mg/dL (ref 1.7–2.4)

## 2021-05-14 NOTE — Progress Notes (Signed)
Progress Note  Patient Name: Karen West Date of Encounter: 05/14/2021  [facility] Cardiologist: Minus Breeding, MD   Subjective    [place] on 2/24 Ao (non-invasive) = 132/77 RA = 3 RV = 60/8 PA = 55/21 (37) PCW = 22 (v waves to 40) Thermo cardiac output/index = 3.3/2.0 Fick CO/CI = 5.0/3.0 PVR = 4.5 (Fick) 3.0 (Thermal) FA sat = 98% PA sat = 63%, 64% PAPi  = 11   Feels ok. Denies SOB, orthopnea or PND.    Inpatient Medications    Scheduled Meds:  aspirin EC  81 mg Oral Daily   enoxaparin (LOVENOX) injection  30 mg Subcutaneous Q24H   ezetimibe  10 mg Oral Daily   feeding supplement (GLUCERNA SHAKE)  237 mL Oral TID BM   ferrous sulfate  325 mg Oral BID WC   furosemide  40 mg Oral Daily   insulin aspart  0-15 Units Subcutaneous TID WC   isosorbide-hydrALAZINE  1 tablet Oral TID   levothyroxine  88 mcg Oral Q0600   metoprolol succinate  12.5 mg Oral QHS   pantoprazole  40 mg Oral Daily   ticagrelor  90 mg Oral BID   Continuous Infusions:   PRN Meds: acetaminophen, diazepam, labetalol, nitroGLYCERIN   Vital Signs    Vitals:   05/14/21 0520 05/14/21 0524 05/14/21 0744 05/14/21 1347  BP:  112/61 112/61 121/69  Pulse:  (!) 103 93 90  Resp:  18    Temp:  98.8 F (37.1 C) 98.8 F (37.1 C) 98.6 F (37 C)  TempSrc:  Oral Oral Oral  SpO2:  94% 94% 99%  Weight: 65.3 kg     Height: 5\' 1"  (1.549 m)       Intake/Output Summary (Last 24 hours) at 05/14/2021 1529 Last data filed at 05/14/2021 0536 Gross per 24 hour  Intake 60 ml  Output 1500 ml  Net -1440 ml    Last 3 Weights 05/14/2021 05/13/2021 05/12/2021  Weight (lbs) 143 lb 15.4 oz 145 lb 8.1 oz 145 lb 4.5 oz  Weight (kg) 65.3 kg 66 kg 65.9 kg      Telemetry    Sinus 90-100 Personally reviewed  Physical Exam   General:  Sitting in chair Well appearing. No resp difficulty HEENT: normal Neck: supple. JVP 5-6 Carotids 2+ bilat; no bruits. No lymphadenopathy or thryomegaly appreciated. Cor:  PMI nondisplaced. Regular rate & rhythm. Soft MR Lungs: clear Abdomen: soft, nontender, nondistended. No hepatosplenomegaly. No bruits or masses. Good bowel sounds. Extremities: no cyanosis, clubbing, rash, edema Neuro: alert & orientedx3, cranial nerves grossly intact. moves all 4 extremities w/o difficulty. Affect pleasant   Labs    High Sensitivity Troponin:   Recent Labs  Lab 04/23/21 1000 04/23/21 1222  TROPONINIHS 22,485* >24,000*      Chemistry Recent Labs  Lab 05/12/21 0125 05/13/21 0150 05/14/21 0426  NA 137 135 138  K 3.9 3.6 3.5  CL 97* 99 102  CO2 24 23 23   GLUCOSE 175* 162* 147*  BUN 71* 79* 68*  CREATININE 2.49* 2.40* 2.11*  CALCIUM 8.8* 8.4* 8.5*  MG 2.1 2.0 1.8  GFRNONAA 19* 20* 24*  ANIONGAP 16* 13 13     Lipids No results for input(s): CHOL, TRIG, HDL, LABVLDL, LDLCALC, CHOLHDL in the last 168 hours.  Hematology Recent Labs  Lab 05/09/21 0732 05/10/21 0345 05/11/21 0120  WBC 7.8 7.2 7.3  RBC 3.65* 3.39* 3.52*  HGB 10.0* 9.2* 9.5*  HCT 31.5* 29.8* 30.7*  MCV 86.3 87.9 87.2  MCH 27.4 27.1 27.0  MCHC 31.7 30.9 30.9  RDW 20.8* 21.2* 21.5*  PLT 360 318 339    Thyroid No results for input(s): TSH, FREET4 in the last 168 hours.  BNPNo results for input(s): BNP, PROBNP in the last 168 hours.  DDimer No results for input(s): DDIMER in the last 168 hours.   Radiology    CARDIAC CATHETERIZATION  Result Date: 05/13/2021 Findings: Ao (non-invasive) = 132/77 RA = 3 RV = 60/8 PA = 55/21 (37) PCW = 22 (v waves to 40) Thermo cardiac output/index = 3.3/2.0 Fick CO/CI = 5.0/3.0 PVR = 4.5 (Fick) 3.0 (Thermal) FA sat = 98% PA sat = 63%, 64% PAPi  = 11 Assessment: 1. Elevated filling pressure with prominent v-waves in PW tracing suggestive of significant MR 2. Preserved CO by Fick (reduced by thermo) 3. Inability to pass swan wire and catheter into the left PA (Patient developed cough but no hemoptysis) Plan/Discussion: RHC suggestive of significant mitral  regurgitation. D/w Dr. Ali Lowe in the cath lab. Glori Bickers, MD 10:14 AM   Cardiac Studies   LHC 04/23/2021    Prox LAD to Mid LAD lesion is 70% stenosed.   Prox LAD lesion is 50% stenosed.   Mid Cx lesion is 100% stenosed.   Dist Cx lesion is 80% stenosed.   3rd Mrg lesion is 100% stenosed.   Non-stenotic Dist RCA lesion was previously treated.   Non-stenotic Mid RCA to Dist RCA lesion was previously treated.   A drug-eluting stent was successfully placed.   Post intervention, there is a 0% residual stenosis.   Post intervention, there is a 0% residual stenosis.   There is moderate left ventricular systolic dysfunction. Acute inferolateral wall ST segment elevation secondary to total occlusion of a large left circumflex coronary artery with TIMI 0 flow.   Calcification involving the proximal LAD with 50 and 70% stenoses.   Widely patent dominant RCA with previously placed 4 tandem stents proximally to distally without restenosis.   Moderate acute LV dysfunction with EF estimated at 35 to 40% with hypocontractility involving the mid distal anterolateral wall.  LVEDP 30 mm   Successful percutaneous coronary intervention to the left circumflex coronary artery with ultimate insertion of a 2.75 x 26 mm Medtronic Onyx frontiers stent postdilated to 3.04 mm with the 100% occlusion being reduced to 0% and brisk TIMI-3 flow in the major vessel.  There is residual thrombus and a small marginal branch arising from the stented segment which was totally occluded initially.  There also was faint thrombus in a very distal branch of the circumflex.  The plan is to continue Aggrastat for 18 hours post procedure.   RECOMMENDATION: DAPT indefinitely.  We will continue Aggrastat 18 hours postinfusion.  Optimize blood pressure.  Plan 2D echo Doppler study.  Continue carvedilol, consider ARB and transition to [facility] if LV function remains impaired.  Continue Praluent/Zetia with statin intolerance and  probable familial hyperlipidemia.     TTE 04/24/2021   1. Septal , apical, lateral and posterior lateral hypokinesis . Left  ventricular ejection fraction, by estimation, is 30 to 35%. The left  ventricle has moderately decreased function. The left ventricle  demonstrates regional wall motion abnormalities  (see scoring diagram/findings for description). The left ventricular  internal cavity size was moderately dilated. There is mild left  ventricular hypertrophy. Left ventricular diastolic parameters are  indeterminate.   2. Right ventricular systolic function is normal. The right ventricular  size  is normal. There is moderately elevated pulmonary artery systolic  pressure.   3. Left atrial size was mildly dilated.   4. The mitral valve is abnormal. Moderate to severe mitral valve  regurgitation. No evidence of mitral stenosis. Severe mitral annular  calcification.   5. Tricuspid valve regurgitation is moderate.   6. The aortic valve is normal in structure. There is moderate  calcification of the aortic valve. There is moderate thickening of the  aortic valve. Aortic valve regurgitation is mild to moderate. Mild aortic  valve stenosis.   7. The inferior vena cava is normal in size with greater than 50%  respiratory variability, suggesting right atrial pressure of 3 mmHg.   Patient Profile     81 y.o. female with CAD status post PCI to the RCA, hypertension, CKD stage IV, anemia, diabetes, sleep apnea, carotid artery disease who was admitted 04/23/2021 for inferolateral STEMI.  Course has been complicated by severe mitral valve regurgitation, AKI, systolic heart failure and anemia.  Assessment & Plan   1. Acute on chronic systolic heart failure:  - due to iCM EF 30-35% and severe MR - volume status ok on lasix 40mg  po daily - Continue Toprol 12.5mg  daily - No ACE/ARB/ARNI/MAR with AKI for now with AKI. Hopefully soon - Continue bidil 1 tid - consider SGLT2i soon if GFR > 20  2.  CAD with Inferolateral ST elevation myocardial infarction:  Late presentation STEMI that has compounded her chronic moderate mitral regurgitation.  - No s/s angina - Continue DAPT, statin and b-blocker - CR to follow  3.  Severe mitral regurgitation: She is tenuous. Will keep in house and obtain repeat TEE Monday for Mitraclip purposes.  4.  AKI on CKD 4  -  baseline Scr ~1.3-1.5 - Today 2.4 -> 2.1 (GFR 24) - Suspect ATN/contrast - continue supportive care. Avoid nephrotoxic agents.   For questions or updat 1.  Acute on chronic systolic heart failure.es, please contact [place] Please consult [email or web address] for contact info under        Signed, Glori Bickers, MD  05/14/2021, 3:29 PM

## 2021-05-14 NOTE — Progress Notes (Signed)
Mobility Specialist Criteria Algorithm Info.   05/14/21 1600  Mobility  Activity Ambulated with assistance in hallway  Range of Motion/Exercises Active;All extremities  Level of Assistance Contact guard assist, steadying assist  Assistive Device None  Distance Ambulated (ft) 100 ft  Activity Response Tolerated well (x1 standing rest break)   Patient received in chair, agreeable to participate in mobility. Ambulated in hallway supervision level with slow steady gait. Required standing rest break x1, denied dizzinsess/lightheadedness or SOB. Returned to room without complaint or incident. Was left with all needs met, call bell in reach.  05/14/2021 4:10 PM  [place] Jouri Threat, [place], [place]  NTJXK:[phone] Office: [phone]

## 2021-05-14 NOTE — Progress Notes (Signed)
CARDIAC REHAB PHASE I   PRE:  Rate/Rhythm: 94 SR  BP:  Sitting: 113/63      SaO2: 98 RA  MODE:  Ambulation: 240 ft   POST:  Rate/Rhythm: 98 SR  BP:  Sitting: 105/61      SaO2: 100 RA  Came to walk pt and she agreed. Pt tolerated exercise well and amb 240 ft with contact guard, steadying assist. Pt denies CP or dizziness throughout walk. Pt did have some SOB and too x1 standing rest break. Reviewed over restrictions, heart healthy diet, and exercise. Will continue to follow.  [id number] Sheppard Plumber, MS, ACSM-CEP 05/14/2021 11:48 AM

## 2021-05-15 DIAGNOSIS — I5023 Acute on chronic systolic (congestive) heart failure: Secondary | ICD-10-CM | POA: Diagnosis not present

## 2021-05-15 LAB — BASIC METABOLIC PANEL
Anion gap: 18 — ABNORMAL HIGH (ref 5–15)
BUN: 73 mg/dL — ABNORMAL HIGH (ref 8–23)
CO2: 17 mmol/L — ABNORMAL LOW (ref 22–32)
Calcium: 8.8 mg/dL — ABNORMAL LOW (ref 8.9–10.3)
Chloride: 102 mmol/L (ref 98–111)
Creatinine, Ser: 2.15 mg/dL — ABNORMAL HIGH (ref 0.44–1.00)
GFR, Estimated: 23 mL/min — ABNORMAL LOW (ref >60)
Glucose, Bld: 165 mg/dL — ABNORMAL HIGH (ref 70–99)
Potassium: 4 mmol/L (ref 3.5–5.1)
Sodium: 137 mmol/L (ref 135–145)

## 2021-05-15 LAB — GLUCOSE, CAPILLARY
Glucose-Capillary: 176 mg/dL — ABNORMAL HIGH (ref 70–99)
Glucose-Capillary: 204 mg/dL — ABNORMAL HIGH (ref 70–99)
Glucose-Capillary: 271 mg/dL — ABNORMAL HIGH (ref 70–99)
Glucose-Capillary: 162 mg/dL — ABNORMAL HIGH (ref 70–99)

## 2021-05-15 LAB — MAGNESIUM: Magnesium: 2 mg/dL (ref 1.7–2.4)

## 2021-05-15 NOTE — Plan of Care (Signed)
  Problem: Education: Goal: Knowledge of General Education information will improve Description: Including pain rating scale, medication(s)/side effects and non-pharmacologic comfort measures Outcome: Progressing   Problem: Clinical Measurements: Goal: Will remain free from infection Outcome: Progressing Goal: Diagnostic test results will improve Outcome: Progressing   

## 2021-05-15 NOTE — Progress Notes (Signed)
Mobility Specialist Criteria Algorithm Info.   05/15/21 1130  Mobility  Activity Ambulated with assistance in hallway  Range of Motion/Exercises Active;All extremities  Level of Assistance Contact guard assist, steadying assist  Assistive Device None  Distance Ambulated (ft) 380 ft  Activity Response Tolerated well   Patient ambulated in hallway min guard with steady gait. Required standing rest break x2 secondary to SOB. Returned to room without complaint or incident. Was left lying supine in bed with all needs met.   05/15/2021 5:19 PM  Karen West, [place], [place]  FUWTK:[phone] Office: [phone]

## 2021-05-15 NOTE — Progress Notes (Signed)
Progress Note  Patient Name: Karen West Date of Encounter: 05/15/2021  [facility] Cardiologist: Minus Breeding, MD   Subjective    [place] on 2/24 Ao (non-invasive) = 132/77 RA = 3 RV = 60/8 PA = 55/21 (37) PCW = 22 (v waves to 40) Thermo cardiac output/index = 3.3/2.0 Fick CO/CI = 5.0/3.0 PVR = 4.5 (Fick) 3.0 (Thermal) FA sat = 98% PA sat = 63%, 64% PAPi  = 11   On po lasix. Feels ok. Denies CP, SOB, orthopnea. Weight stable. Scr stable at 2.1. Serum bicarb down today but no evidence of hypoperfusion.  SBP 102-126    Inpatient Medications    Scheduled Meds:  aspirin EC  81 mg Oral Daily   enoxaparin (LOVENOX) injection  30 mg Subcutaneous Q24H   ezetimibe  10 mg Oral Daily   feeding supplement (GLUCERNA SHAKE)  237 mL Oral TID BM   ferrous sulfate  325 mg Oral BID WC   furosemide  40 mg Oral Daily   insulin aspart  0-15 Units Subcutaneous TID WC   isosorbide-hydrALAZINE  1 tablet Oral TID   levothyroxine  88 mcg Oral Q0600   metoprolol succinate  12.5 mg Oral QHS   pantoprazole  40 mg Oral Daily   ticagrelor  90 mg Oral BID   Continuous Infusions:   PRN Meds: acetaminophen, diazepam, labetalol, nitroGLYCERIN   Vital Signs    Vitals:   05/14/21 1839 05/14/21 2017 05/15/21 0445 05/15/21 0916  BP: 120/62 (!) 102/46 126/74 122/75  Pulse:  95 98   Resp:  16  18  Temp:  (!) 97.5 F (36.4 C) 98.5 F (36.9 C) 99.1 F (37.3 C)  TempSrc:  Oral Oral Oral  SpO2:  99% 91%   Weight:   65 kg   Height:        Intake/Output Summary (Last 24 hours) at 05/15/2021 1024 Last data filed at 05/15/2021 0600 Gross per 24 hour  Intake 180 ml  Output 2200 ml  Net -2020 ml    Last 3 Weights 05/15/2021 05/14/2021 05/13/2021  Weight (lbs) 143 lb 4.8 oz 143 lb 15.4 oz 145 lb 8.1 oz  Weight (kg) 65 kg 65.3 kg 66 kg      Telemetry    Sinus 90s Personally reviewed   Physical Exam   General:  Sitting in chair  No resp difficulty HEENT: normal Neck: supple. no  JVD. Carotids 2+ bilat; no bruits. No lymphadenopathy or thryomegaly appreciated. Cor: PMI nondisplaced. Irregular rate & rhythm.Soft MR Lungs: clear but decreased  Abdomen: soft, nontender, nondistended. No hepatosplenomegaly. No bruits or masses. Good bowel sounds. Extremities: no cyanosis, clubbing, rash, edema Neuro: alert & orientedx3, cranial nerves grossly intact. moves all 4 extremities w/o difficulty. Affect pleasant    Labs    High Sensitivity Troponin:   Recent Labs  Lab 04/23/21 1000 04/23/21 1222  TROPONINIHS 22,485* >24,000*      Chemistry Recent Labs  Lab 05/13/21 0150 05/14/21 0426 05/15/21 0247  NA 135 138 137  K 3.6 3.5 4.0  CL 99 102 102  CO2 23 23 17*  GLUCOSE 162* 147* 165*  BUN 79* 68* 73*  CREATININE 2.40* 2.11* 2.15*  CALCIUM 8.4* 8.5* 8.8*  MG 2.0 1.8 2.0  GFRNONAA 20* 24* 23*  ANIONGAP 13 13 18*     Lipids No results for input(s): CHOL, TRIG, HDL, LABVLDL, LDLCALC, CHOLHDL in the last 168 hours.  Hematology Recent Labs  Lab 05/09/21 0732 05/10/21 0345 05/11/21 0120  WBC 7.8 7.2 7.3  RBC 3.65* 3.39* 3.52*  HGB 10.0* 9.2* 9.5*  HCT 31.5* 29.8* 30.7*  MCV 86.3 87.9 87.2  MCH 27.4 27.1 27.0  MCHC 31.7 30.9 30.9  RDW 20.8* 21.2* 21.5*  PLT 360 318 339    Thyroid No results for input(s): TSH, FREET4 in the last 168 hours.  BNPNo results for input(s): BNP, PROBNP in the last 168 hours.  DDimer No results for input(s): DDIMER in the last 168 hours.   Radiology    No results found.  Cardiac Studies   LHC 04/23/2021    Prox LAD to Mid LAD lesion is 70% stenosed.   Prox LAD lesion is 50% stenosed.   Mid Cx lesion is 100% stenosed.   Dist Cx lesion is 80% stenosed.   3rd Mrg lesion is 100% stenosed.   Non-stenotic Dist RCA lesion was previously treated.   Non-stenotic Mid RCA to Dist RCA lesion was previously treated.   A drug-eluting stent was successfully placed.   Post intervention, there is a 0% residual stenosis.   Post  intervention, there is a 0% residual stenosis.   There is moderate left ventricular systolic dysfunction. Acute inferolateral wall ST segment elevation secondary to total occlusion of a large left circumflex coronary artery with TIMI 0 flow.   Calcification involving the proximal LAD with 50 and 70% stenoses.   Widely patent dominant RCA with previously placed 4 tandem stents proximally to distally without restenosis.   Moderate acute LV dysfunction with EF estimated at 35 to 40% with hypocontractility involving the mid distal anterolateral wall.  LVEDP 30 mm   Successful percutaneous coronary intervention to the left circumflex coronary artery with ultimate insertion of a 2.75 x 26 mm Medtronic Onyx frontiers stent postdilated to 3.04 mm with the 100% occlusion being reduced to 0% and brisk TIMI-3 flow in the major vessel.  There is residual thrombus and a small marginal branch arising from the stented segment which was totally occluded initially.  There also was faint thrombus in a very distal branch of the circumflex.  The plan is to continue Aggrastat for 18 hours post procedure.   RECOMMENDATION: DAPT indefinitely.  We will continue Aggrastat 18 hours postinfusion.  Optimize blood pressure.  Plan 2D echo Doppler study.  Continue carvedilol, consider ARB and transition to [facility] if LV function remains impaired.  Continue Praluent/Zetia with statin intolerance and probable familial hyperlipidemia.     TTE 04/24/2021   1. Septal , apical, lateral and posterior lateral hypokinesis . Left  ventricular ejection fraction, by estimation, is 30 to 35%. The left  ventricle has moderately decreased function. The left ventricle  demonstrates regional wall motion abnormalities  (see scoring diagram/findings for description). The left ventricular  internal cavity size was moderately dilated. There is mild left  ventricular hypertrophy. Left ventricular diastolic parameters are  indeterminate.   2.  Right ventricular systolic function is normal. The right ventricular  size is normal. There is moderately elevated pulmonary artery systolic  pressure.   3. Left atrial size was mildly dilated.   4. The mitral valve is abnormal. Moderate to severe mitral valve  regurgitation. No evidence of mitral stenosis. Severe mitral annular  calcification.   5. Tricuspid valve regurgitation is moderate.   6. The aortic valve is normal in structure. There is moderate  calcification of the aortic valve. There is moderate thickening of the  aortic valve. Aortic valve regurgitation is mild to moderate. Mild aortic  valve stenosis.  7. The inferior vena cava is normal in size with greater than 50%  respiratory variability, suggesting right atrial pressure of 3 mmHg.   Patient Profile     81 y.o. female with CAD status post PCI to the RCA, hypertension, CKD stage IV, anemia, diabetes, sleep apnea, carotid artery disease who was admitted 04/23/2021 for inferolateral STEMI.  Course has been complicated by severe mitral valve regurgitation, AKI, systolic heart failure and anemia.  Assessment & Plan   1. 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 Weight stable  -  Serum bicarb down today but no evidence of hypoperfusion.  SBP 102-126  - Continue Toprol 12.5mg  daily - No ACE/ARB/ARNI/MAR with AKI for now with AKI. Hopefully soon - Continue bidil 1 tid - consider SGLT2i soon if GFR > 20  2. CAD with Inferolateral ST elevation myocardial infarction:  Late presentation STEMI that has compounded her chronic moderate mitral regurgitation.  - No s/s angina - Continue DAPT, statin and b-blocker - CR to follow  3.  Severe mitral regurgitation: She is tenuous. Will keep in house and obtain repeat TEE tomorrow for Mitraclip purposes.  4.  AKI on CKD 4  -  baseline Scr ~1.3-1.5 - Stable today at 2.1 (GFR 24) - Cardiac output ok on  RHC. Suspect ATN/contrast - continue supportive care. Avoid nephrotoxic agents.   For questions or updat 1.  Acute on chronic systolic heart failure.es, please contact [place] Please consult [email or web address] for contact info under        Signed, Glori Bickers, MD  05/15/2021, 10:24 AM

## 2021-05-15 NOTE — Plan of Care (Signed)

## 2021-05-16 ENCOUNTER — Encounter (HOSPITAL_COMMUNITY)
Admission: EM | Disposition: A | Discharge status: Home Health | Payer: Self-pay | Source: Home / Self Care | Attending: Cardiovascular Disease

## 2021-05-16 ENCOUNTER — Other Ambulatory Visit (HOSPITAL_COMMUNITY): Payer: Self-pay

## 2021-05-16 ENCOUNTER — Encounter (HOSPITAL_COMMUNITY): Payer: Self-pay | Admitting: Cardiovascular Disease

## 2021-05-16 ENCOUNTER — Inpatient Hospital Stay (HOSPITAL_COMMUNITY): Payer: Medicare Other | Admitting: Anesthesiology

## 2021-05-16 ENCOUNTER — Inpatient Hospital Stay (HOSPITAL_COMMUNITY): Payer: Medicare Other

## 2021-05-16 DIAGNOSIS — I110 Hypertensive heart disease with heart failure: Secondary | ICD-10-CM

## 2021-05-16 DIAGNOSIS — I5020 Unspecified systolic (congestive) heart failure: Secondary | ICD-10-CM

## 2021-05-16 DIAGNOSIS — I2510 Atherosclerotic heart disease of native coronary artery without angina pectoris: Secondary | ICD-10-CM

## 2021-05-16 DIAGNOSIS — I2121 ST elevation (STEMI) myocardial infarction involving left circumflex coronary artery: Secondary | ICD-10-CM | POA: Diagnosis not present

## 2021-05-16 DIAGNOSIS — I5023 Acute on chronic systolic (congestive) heart failure: Secondary | ICD-10-CM | POA: Diagnosis not present

## 2021-05-16 DIAGNOSIS — I340 Nonrheumatic mitral (valve) insufficiency: Secondary | ICD-10-CM | POA: Diagnosis not present

## 2021-05-16 DIAGNOSIS — I088 Other rheumatic multiple valve diseases: Secondary | ICD-10-CM

## 2021-05-16 HISTORY — PX: TEE WITHOUT CARDIOVERSION: SHX5443

## 2021-05-16 LAB — GLUCOSE, CAPILLARY
Glucose-Capillary: 153 mg/dL — ABNORMAL HIGH (ref 70–99)
Glucose-Capillary: 127 mg/dL — ABNORMAL HIGH (ref 70–99)
Glucose-Capillary: 215 mg/dL — ABNORMAL HIGH (ref 70–99)
Glucose-Capillary: 229 mg/dL — ABNORMAL HIGH (ref 70–99)
Glucose-Capillary: 195 mg/dL — ABNORMAL HIGH (ref 70–99)

## 2021-05-16 LAB — BASIC METABOLIC PANEL
Anion gap: 12 (ref 5–15)
BUN: 70 mg/dL — ABNORMAL HIGH (ref 8–23)
CO2: 25 mmol/L (ref 22–32)
Calcium: 8.5 mg/dL — ABNORMAL LOW (ref 8.9–10.3)
Chloride: 100 mmol/L (ref 98–111)
Creatinine, Ser: 2.35 mg/dL — ABNORMAL HIGH (ref 0.44–1.00)
GFR, Estimated: 21 mL/min — ABNORMAL LOW (ref >60)
Glucose, Bld: 140 mg/dL — ABNORMAL HIGH (ref 70–99)
Potassium: 3.4 mmol/L — ABNORMAL LOW (ref 3.5–5.1)
Sodium: 137 mmol/L (ref 135–145)

## 2021-05-16 LAB — ECHO TEE
MV M vel: 5.7 m/s
MV Peak grad: 130 mmHg
MV VTI: 1.36 cm2
Radius: 0.7 cm

## 2021-05-16 LAB — MAGNESIUM: Magnesium: 1.9 mg/dL (ref 1.7–2.4)

## 2021-05-16 SURGERY — ECHOCARDIOGRAM, TRANSESOPHAGEAL
Anesthesia: Monitor Anesthesia Care

## 2021-05-16 MED ORDER — POTASSIUM CHLORIDE CRYS ER 20 MEQ PO TBCR
40.0000 meq | EXTENDED_RELEASE_TABLET | Freq: Once | ORAL | Status: AC
  Administered 2021-05-16: 40 meq via ORAL
  Filled 2021-05-16 (×2): qty 2

## 2021-05-16 MED ORDER — MAGNESIUM SULFATE 2 GM/50ML IV SOLN
2.0000 g | Freq: Once | INTRAVENOUS | Status: AC
  Administered 2021-05-16: 2 g via INTRAVENOUS
  Filled 2021-05-16 (×2): qty 50

## 2021-05-16 MED ORDER — SODIUM CHLORIDE 0.9 % IV SOLN: INTRAVENOUS | Status: DC | PRN

## 2021-05-16 MED ORDER — PROPOFOL 10 MG/ML IV BOLUS
INTRAVENOUS | Status: DC | PRN
Start: 2021-05-16 — End: 2021-05-16
  Administered 2021-05-16: 20 mg via INTRAVENOUS

## 2021-05-16 MED ORDER — PROPOFOL 500 MG/50ML IV EMUL
INTRAVENOUS | Status: DC | PRN
Start: 2021-05-16 — End: 2021-05-16
  Administered 2021-05-16: 100 ug/kg/min via INTRAVENOUS

## 2021-05-16 MED FILL — Heparin Sod (Porcine)-NaCl IV Soln 1000 Unit/500ML-0.9%: INTRAVENOUS | Qty: 500 | Status: AC

## 2021-05-16 NOTE — Transfer of Care (Signed)
Immediate Anesthesia Transfer of Care Note  Patient: Karen West  Procedure(s) Performed: TRANSESOPHAGEAL ECHOCARDIOGRAM (TEE)  Patient Location: Endoscopy Unit  Anesthesia Type:MAC  Level of Consciousness: awake and drowsy  Airway & Oxygen Therapy: Patient Spontanous Breathing and Patient connected to nasal cannula oxygen  Post-op Assessment: Report given to RN and Post -op Vital signs reviewed and stable  Post vital signs: Reviewed and stable  Last Vitals:  Vitals Value Taken Time  BP 106/59 05/16/21 1349  Temp    Pulse 80 05/16/21 1352  Resp 28 05/16/21 1352  SpO2 96 % 05/16/21 1352  Vitals shown include unvalidated device data.  Last Pain:  Vitals:   05/16/21 1140  TempSrc: Temporal  PainSc: 0-No pain      Patients Stated Pain Goal: 0 (15/94/58 5929)  Complications: No notable events documented.

## 2021-05-16 NOTE — CV Procedure (Signed)
° ° °  TRANSESOPHAGEAL ECHOCARDIOGRAM   NAME:  EMMAJANE ALTAMURA    MRN: [id number] DOB:  Aug 06, 1940    ADMIT DATE: 04/23/2021  INDICATIONS: Mitral Valve disease  PROCEDURE:   Informed consent was obtained prior to the procedure. The risks, benefits and alternatives for the procedure were discussed and the patient comprehended these risks.  Risks include, but are not limited to, cough, sore throat, vomiting, nausea, somnolence, esophageal and stomach trauma or perforation, bleeding, low blood pressure, aspiration, pneumonia, infection, trauma to the teeth and death.    Procedural time out performed. The oropharynx was anesthetized with topical 1% benzocaine.    Anesthesia was administered by Dr. Ambrose Pancoast and team.  The patient was administered a total of Propofol 188 mg to achieve and maintain moderate to deep conscious sedation.  The patient's heart rate, blood pressure, and oxygen saturation are monitored continuously during the procedure. The period of conscious sedation is 20 minutes, of which I was present face-to-face 100% of this time.   The transesophageal probe was inserted in the esophagus and stomach without difficulty and multiple views were obtained.   COMPLICATIONS:    There were no immediate complications.  KEY FINDINGS:  Severe Mitral Regurgitation.  Anatomy for potential TEER interventions in the full report. Heart Failure with reduced EF- 35% Mild AI, Moderate AS (Low flow). Aortic atherosclerosis. Full report to follow. Further management per primary team.   Rudean Haskell, MD [place]  2:20 PM

## 2021-05-16 NOTE — Progress Notes (Signed)
Progress Note  Patient Name: Karen West Date of Encounter: 05/16/2021  [facility] Cardiologist: Minus Breeding, MD   Subjective   No acute events overnight.  TEE today confirms severe MR (mixed flail and ischemic II/IIIB).  Patient doing well, ambulating without much difficulty.  Diuresing well, Cr 2.35.    Inpatient Medications    Scheduled Meds:  aspirin EC  81 mg Oral Daily   enoxaparin (LOVENOX) injection  30 mg Subcutaneous Q24H   ezetimibe  10 mg Oral Daily   feeding supplement (GLUCERNA SHAKE)  237 mL Oral TID BM   ferrous sulfate  325 mg Oral BID WC   furosemide  40 mg Oral Daily   insulin aspart  0-15 Units Subcutaneous TID WC   isosorbide-hydrALAZINE  1 tablet Oral TID   levothyroxine  88 mcg Oral Q0600   metoprolol succinate  12.5 mg Oral QHS   pantoprazole  40 mg Oral Daily   ticagrelor  90 mg Oral BID   Continuous Infusions:  magnesium sulfate bolus IVPB      PRN Meds: acetaminophen, diazepam, labetalol, nitroGLYCERIN   Vital Signs    Vitals:   05/16/21 1349 05/16/21 1350 05/16/21 1359 05/16/21 1409  BP: (!) 106/59  (!) 125/55 (!) 176/62  Pulse: 80  87 87  Resp: (!) 29  20 13   Temp:  (!) 97.2 F (36.2 C)    TempSrc:  Temporal    SpO2: 95%  97% 96%  Weight:      Height:        Intake/Output Summary (Last 24 hours) at 05/16/2021 1547 Last data filed at 05/16/2021 1342 Gross per 24 hour  Intake 540 ml  Output 1175 ml  Net -635 ml    Last 3 Weights 05/16/2021 05/15/2021 05/14/2021  Weight (lbs) 143 lb 4.8 oz 143 lb 4.8 oz 143 lb 15.4 oz  Weight (kg) 65 kg 65 kg 65.3 kg      Telemetry    Sinus rhythm- Personally Reviewed  ECG    None today  Physical Exam   GEN: No acute distress.   Neck: No JVD Cardiac: RRR, 3/6 holosystolic murmur Respiratory: Clear to auscultation bilaterally. GI: Soft, nontender, non-distended  MS: No edema; No deformity. Neuro:  Nonfocal  Psych: Normal affect   Labs    High Sensitivity  Troponin:   Recent Labs  Lab 04/23/21 1000 04/23/21 1222  TROPONINIHS 22,485* >24,000*      Chemistry Recent Labs  Lab 05/14/21 0426 05/15/21 0247 05/16/21 0338  NA 138 137 137  K 3.5 4.0 3.4*  CL 102 102 100  CO2 23 17* 25  GLUCOSE 147* 165* 140*  BUN 68* 73* 70*  CREATININE 2.11* 2.15* 2.35*  CALCIUM 8.5* 8.8* 8.5*  MG 1.8 2.0 1.9  GFRNONAA 24* 23* 21*  ANIONGAP 13 18* 12     Lipids No results for input(s): CHOL, TRIG, HDL, LABVLDL, LDLCALC, CHOLHDL in the last 168 hours.  Hematology Recent Labs  Lab 05/10/21 0345 05/11/21 0120  WBC 7.2 7.3  RBC 3.39* 3.52*  HGB 9.2* 9.5*  HCT 29.8* 30.7*  MCV 87.9 87.2  MCH 27.1 27.0  MCHC 30.9 30.9  RDW 21.2* 21.5*  PLT 318 339    Thyroid No results for input(s): TSH, FREET4 in the last 168 hours.  BNPNo results for input(s): BNP, PROBNP in the last 168 hours.  DDimer No results for input(s): DDIMER in the last 168 hours.   Radiology    ECHO TEE  Result Date: 05/16/2021    TRANSESOPHOGEAL ECHO REPORT   Patient Name:   Karen West Date of Exam: 05/16/2021 Medical Rec #:  [id number]        Height:       61.0 in Accession #:    [id number]       Weight:       143.3 lb Date of Birth:  13-May-1940         BSA:          1.639 m Patient Age:    81 years         BP:           132/80 mmHg Patient Gender: F                HR:           88 bpm. Exam Location:  Inpatient Procedure: Transesophageal Echo, 3D Echo, Color Doppler and Cardiac Doppler Indications:     I34.0 Nonrheumatic mitral (valve) insufficiency  History:         Patient has prior history of Echocardiogram examinations, most                  recent 05/09/2021. Risk Factors:Hypertension, Diabetes,                  Dyslipidemia and Sleep Apnea.  Sonographer:     Raquel Sarna Senior RDCS Referring Phys:  [id number] [facility] Diagnosing Phys: Rudean Haskell MD PROCEDURE: After discussion of the risks and benefits of a TEE, an informed consent was obtained from the  patient. The transesophogeal probe was passed without difficulty through the esophogus of the patient. Sedation performed by different physician. The patient was monitored while under deep sedation. Anesthestetic sedation was provided intravenously by Anesthesiology: 188mg  of Propofol. The patient developed no complications during the procedure. IMPRESSIONS  1. Left ventricular ejection fraction, by estimation, is 35 to 40%. Left ventricular ejection fraction by 3D volume is 35 %. The left ventricle has moderately decreased function. Anterolateral wall motion abdnormalities.  2. The mitral valve is degenerative. Severe mitral valve regurgitation there is a functional componet after LCX infarct associated WMA and calcified posterior leaflet. There is pulmonary vein systolic flow reversal of the left sized veins. 3D Vena contracta area 0.42 cm2. Mean gradient of 2 mm Hg. There posterior leaflet length of 1.15 cm as best and the MVA by 3D planimetry of 3.83 cm2 are poor prognostic signs for TEER interventions.  3. Right ventricular systolic function is normal. The right ventricular size is normal.  4. Left atrial size was dilated. No left atrial/left atrial appendage thrombus was detected.  5. A small pericardial effusion is present. The pericardial effusion is anterior to the right ventricle.  6. The aortic valve is calcified. Aortic valve regurgitation is mild. Moderate aortic valve stenosis.  7. There is Moderate (Grade III) plaque involving the descending aorta. Comparison(s): Mitral regurgitation appears worse from prior study. FINDINGS  Left Ventricle: Left ventricular ejection fraction, by estimation, is 35 to 40%. Left ventricular ejection fraction by 3D volume is 35 %. The left ventricle has moderately decreased function. The left ventricular internal cavity size was normal in size.  LV Wall Scoring: The entire lateral wall is hypokinetic. Right Ventricle: The right ventricular size is normal. No increase in  right ventricular wall thickness. Right ventricular systolic function is normal. Left Atrium: Left atrial size was dilated. No left atrial/left atrial appendage thrombus was detected. Right Atrium: Right atrial  size was normal in size. Prominent Chiari network. Pericardium: A small pericardial effusion is present. The pericardial effusion is anterior to the right ventricle. Mitral Valve: The mitral valve is degenerative in appearance. Severe mitral valve regurgitation. MV peak gradient, 5.5 mmHg. The mean mitral valve gradient is 2.0 mmHg. Tricuspid Valve: The tricuspid valve is normal in structure. Tricuspid valve regurgitation is trivial. No evidence of tricuspid stenosis. Aortic Valve: The aortic valve is calcified. Aortic valve regurgitation is mild. Moderate aortic stenosis is present. Pulmonic Valve: The pulmonic valve was grossly normal. Pulmonic valve regurgitation is not visualized. Aorta: The aortic root and ascending aorta are structurally normal, with no evidence of dilitation. There is moderate (Grade III) plaque involving the descending aorta. IAS/Shunts: No atrial level shunt detected by color flow Doppler.  LEFT VENTRICLE PLAX 2D LVOT diam:     1.80 cm LV SV:         36 LV SV Index:   22              3D Volume EF LVOT Area:     2.54 cm        LV 3D EF:    Left                                             ventricul                                             ar                                             ejection                                             fraction                                             by 3D                                             volume is                                             35 %.                                LV 3D EDV:   92.87 ml                                LV 3D ESV:   59.96 ml  3D Volume EF:                                3D EF:        35 % AORTIC VALVE LVOT Vmax:   83.60 cm/s LVOT Vmean:  55.100 cm/s LVOT VTI:    0.141 m  MITRAL VALVE MV Area VTI:  1.36 cm        SHUNTS MV Peak grad: 5.5 mmHg        Systemic VTI:  0.14 m MV Mean grad: 2.0 mmHg        Systemic Diam: 1.80 cm MV Vmax:      1.17 m/s MV Vmean:     73.0 cm/s MR Peak grad:    130.0 mmHg MR Mean grad:    81.0 mmHg MR Vmax:         570.00 cm/s MR Vmean:        419.0 cm/s MR PISA:         3.08 cm MR PISA Eff ROA: 21 mm MR PISA Radius:  0.70 cm Rudean Haskell MD Electronically signed by Rudean Haskell MD Signature Date/Time: 05/16/2021/2:30:51 PM    Final     Cardiac Studies   LHC 04/23/2021    Prox LAD to Mid LAD lesion is 70% stenosed.   Prox LAD lesion is 50% stenosed.   Mid Cx lesion is 100% stenosed.   Dist Cx lesion is 80% stenosed.   3rd Mrg lesion is 100% stenosed.   Non-stenotic Dist RCA lesion was previously treated.   Non-stenotic Mid RCA to Dist RCA lesion was previously treated.   A drug-eluting stent was successfully placed.   Post intervention, there is a 0% residual stenosis.   Post intervention, there is a 0% residual stenosis.   There is moderate left ventricular systolic dysfunction. Acute inferolateral wall ST segment elevation secondary to total occlusion of a large left circumflex coronary artery with TIMI 0 flow.   Calcification involving the proximal LAD with 50 and 70% stenoses.   Widely patent dominant RCA with previously placed 4 tandem stents proximally to distally without restenosis.   Moderate acute LV dysfunction with EF estimated at 35 to 40% with hypocontractility involving the mid distal anterolateral wall.  LVEDP 30 mm   Successful percutaneous coronary intervention to the left circumflex coronary artery with ultimate insertion of a 2.75 x 26 mm Medtronic Onyx frontiers stent postdilated to 3.04 mm with the 100% occlusion being reduced to 0% and brisk TIMI-3 flow in the major vessel.  There is residual thrombus and a small marginal branch arising from the stented segment which was totally occluded  initially.  There also was faint thrombus in a very distal branch of the circumflex.  The plan is to continue Aggrastat for 18 hours post procedure.   RECOMMENDATION: DAPT indefinitely.  We will continue Aggrastat 18 hours postinfusion.  Optimize blood pressure.  Plan 2D echo Doppler study.  Continue carvedilol, consider ARB and transition to [facility] if LV function remains impaired.  Continue Praluent/Zetia with statin intolerance and probable familial hyperlipidemia.     TTE 04/24/2021   1. Septal , apical, lateral and posterior lateral hypokinesis . Left  ventricular ejection fraction, by estimation, is 30 to 35%. The left  ventricle has moderately decreased function. The left ventricle  demonstrates regional wall motion abnormalities  (see scoring diagram/findings for description). The left ventricular  internal cavity size was moderately dilated.  There is mild left  ventricular hypertrophy. Left ventricular diastolic parameters are  indeterminate.   2. Right ventricular systolic function is normal. The right ventricular  size is normal. There is moderately elevated pulmonary artery systolic  pressure.   3. Left atrial size was mildly dilated.   4. The mitral valve is abnormal. Moderate to severe mitral valve  regurgitation. No evidence of mitral stenosis. Severe mitral annular  calcification.   5. Tricuspid valve regurgitation is moderate.   6. The aortic valve is normal in structure. There is moderate  calcification of the aortic valve. There is moderate thickening of the  aortic valve. Aortic valve regurgitation is mild to moderate. Mild aortic  valve stenosis.   7. The inferior vena cava is normal in size with greater than 50%  respiratory variability, suggesting right atrial pressure of 3 mmHg.   Patient Profile     81 y.o. female with CAD status post PCI to the RCA, hypertension, CKD stage IV, anemia, diabetes, sleep apnea, carotid artery disease who was admitted 04/23/2021 for  inferolateral STEMI.  Course has been complicated by severe mitral valve regurgitation, AKI, systolic heart failure and anemia.  Assessment & Plan     Acute on chronic systolic heart failure:  Patient clinically better.  Cont current medical regimen with Bidil, jardiance, and lasix.  Discussed with Dr. Jeffie Pollock, plan on d/c tomorrow and has f/u with me on 3/6.  Greatly appreciate HF care/management.  2.  Inferolateral ST elevation myocardial infarction:  DAPT, BB.  3.  Severe mitral regurgitation: Will see if she can be managed medically.  Her anatomy is not straightforward and I think she will likely need mTEER in the future.    4.  Chronic kidney disease: Cr mildly elevate vs yesterday.  Monitor for now.  Euvolemic on exam.  For questions or updat 1.  Acute on chronic systolic heart failure.es, please contact [facility] Please consult [email or web address] for contact info under        Signed, Early Osmond, MD  05/16/2021, 3:47 PM

## 2021-05-16 NOTE — Anesthesia Postprocedure Evaluation (Signed)
Anesthesia Post Note  Patient: Karen West  Procedure(s) Performed: TRANSESOPHAGEAL ECHOCARDIOGRAM (TEE)     Patient location during evaluation: PACU Anesthesia Type: MAC Level of consciousness: awake and alert Pain management: pain level controlled Vital Signs Assessment: post-procedure vital signs reviewed and stable Respiratory status: spontaneous breathing, nonlabored ventilation, respiratory function stable and patient connected to nasal cannula oxygen Cardiovascular status: stable and blood pressure returned to baseline Postop Assessment: no apparent nausea or vomiting Anesthetic complications: no   No notable events documented.  Last Vitals:  Vitals:   05/16/21 1409 05/16/21 1636  BP: (!) 176/62 (!) 97/54  Pulse: 87 97  Resp: 13 18  Temp:  36.4 C  SpO2: 96% 97%    Last Pain:  Vitals:   05/16/21 1636  TempSrc: Oral  PainSc:                  Akshar Starnes

## 2021-05-16 NOTE — Progress Notes (Addendum)
CARDIAC REHAB PHASE I   PRE:  Rate/Rhythm: 89 SR    BP: sitting 109/64    SaO2:   MODE:  Ambulation: 330 ft   POST:  Rate/Rhythm: 119 ST    BP: sitting 126/61     SaO2:   Pt donned bedroom shoes as her feet are hurting. Able to walk hall with contact guard. SOB with distance but able walk double the distance of last week. Also SOB much more controlled, pt now utilizing pursed lip breathing. Brief rest against wall at halfway point. Return to EOB, HR elevated. [id number]  [place], ACSM 05/16/2021 10:30 AM

## 2021-05-16 NOTE — Progress Notes (Addendum)
Progress Note   Patient Name: Karen West Date of Encounter: 05/16/2021   [facility] Cardiologist: Minus Breeding, MD    Subjective      Hoffman on 2/24 Ao (non-invasive) = 132/77 RA = 3 RV = 60/8 PA = 55/21 (37) PCW = 22 (v waves to 40) Thermo cardiac output/index = 3.3/2.0 Fick CO/CI = 5.0/3.0 PVR = 4.5 (Fick) 3.0 (Thermal) FA sat = 98% PA sat = 63%, 64% PAPi  = 11    On po lasix. Scr 2.11>2.15>2.35. K 3.4 and Mag 1.9.    Weight stable. 1575 cc UOP yesterday + unmeasured void.   SBP stable 100s-110s   Feels well. Dyspnea significantly improved. No orthopnea or PND. Ambulated 380 feet in halls yesterday.      Inpatient Medications    Scheduled Meds:  aspirin EC  81 mg Oral Daily   enoxaparin (LOVENOX) injection  30 mg Subcutaneous Q24H   ezetimibe  10 mg Oral Daily   feeding supplement (GLUCERNA SHAKE)  237 mL Oral TID BM   ferrous sulfate  325 mg Oral BID WC   furosemide  40 mg Oral Daily   insulin aspart  0-15 Units Subcutaneous TID WC   isosorbide-hydrALAZINE  1 tablet Oral TID   levothyroxine  88 mcg Oral Q0600   metoprolol succinate  12.5 mg Oral QHS   pantoprazole  40 mg Oral Daily   ticagrelor  90 mg Oral BID    Continuous Infusions:     PRN Meds: acetaminophen, diazepam, labetalol, nitroGLYCERIN    Vital Signs          Vitals:    05/15/21 1743 05/15/21 2100 05/16/21 0517 05/16/21 0732  BP: 120/71 (!) 110/56 (!) 105/57 (!) 112/59  Pulse:   92 88 94  Resp: 18 18   18   Temp:   98.6 F (37 C) 98.6 F (37 C) 98.7 F (37.1 C)  TempSrc:   Oral Oral Oral  SpO2:   99% 100% 96%  Weight:     65 kg    Height:              Intake/Output Summary (Last 24 hours) at 05/16/2021 0857 Last data filed at 05/16/2021 0500    Gross per 24 hour  Intake 390 ml  Output 1575 ml  Net -1185 ml    Last 3 Weights 05/16/2021 05/15/2021 05/14/2021  Weight (lbs) 143 lb 4.8 oz 143 lb 4.8 oz 143 lb 15.4 oz  Weight (kg) 65 kg 65 kg 65.3 kg       Telemetry     Sinus 90s, 1-5 PVCs/min (personally reviewed)     Physical Exam    General:  No distress. Lying comfortably in bed. HEENT: normal Neck: supple. no JVD. Carotids 2+ bilat; no bruits.  Cor: PMI nondisplaced. Regular rate & rhythm. No rubs, gallops, 3/6 HSM Lungs: clear Abdomen: soft, nontender, nondistended.  Extremities: no cyanosis, clubbing, rash, edema Neuro: alert & orientedx3, cranial nerves grossly intact. moves all 4 extremities w/o difficulty. Affect pleasant         Labs    High Sensitivity Troponin:   Last Labs       Recent Labs  Lab 04/23/21 1000 04/23/21 1222  TROPONINIHS 22,485* >24,000*       Chemistry Last Labs        Recent Labs  Lab 05/14/21 0426 05/15/21 0247 05/16/21 0338  NA 138 137 137  K 3.5 4.0 3.4*  CL 102 102 100  CO2  23 17* 25  GLUCOSE 147* 165* 140*  BUN 68* 73* 70*  CREATININE 2.11* 2.15* 2.35*  CALCIUM 8.5* 8.8* 8.5*  MG 1.8 2.0 1.9  GFRNONAA 24* 23* 21*  ANIONGAP 13 18* 12      Lipids  Last Labs   No results for input(s): CHOL, TRIG, HDL, LABVLDL, LDLCALC, CHOLHDL in the last 168 hours.    Hematology Last Labs       Recent Labs  Lab 05/10/21 0345 05/11/21 0120  WBC 7.2 7.3  RBC 3.39* 3.52*  HGB 9.2* 9.5*  HCT 29.8* 30.7*  MCV 87.9 87.2  MCH 27.1 27.0  MCHC 30.9 30.9  RDW 21.2* 21.5*  PLT 318 339      Thyroid  Last Labs   No results for input(s): TSH, FREET4 in the last 168 hours.    BNP Last Labs   No results for input(s): BNP, PROBNP in the last 168 hours.    DDimer  Last Labs   No results for input(s): DDIMER in the last 168 hours.      Radiology    Imaging Results (Last 48 hours)  No results found.     Cardiac Studies    LHC 04/23/2021     Prox LAD to Mid LAD lesion is 70% stenosed.   Prox LAD lesion is 50% stenosed.   Mid Cx lesion is 100% stenosed.   Dist Cx lesion is 80% stenosed.   3rd Mrg lesion is 100% stenosed.   Non-stenotic Dist RCA lesion was previously treated.    Non-stenotic Mid RCA to Dist RCA lesion was previously treated.   A drug-eluting stent was successfully placed.   Post intervention, there is a 0% residual stenosis.   Post intervention, there is a 0% residual stenosis.   There is moderate left ventricular systolic dysfunction. Acute inferolateral wall ST segment elevation secondary to total occlusion of a large left circumflex coronary artery with TIMI 0 flow.   Calcification involving the proximal LAD with 50 and 70% stenoses.   Widely patent dominant RCA with previously placed 4 tandem stents proximally to distally without restenosis.   Moderate acute LV dysfunction with EF estimated at 35 to 40% with hypocontractility involving the mid distal anterolateral wall.  LVEDP 30 mm   Successful percutaneous coronary intervention to the left circumflex coronary artery with ultimate insertion of a 2.75 x 26 mm Medtronic Onyx frontiers stent postdilated to 3.04 mm with the 100% occlusion being reduced to 0% and brisk TIMI-3 flow in the major vessel.  There is residual thrombus and a small marginal branch arising from the stented segment which was totally occluded initially.  There also was faint thrombus in a very distal branch of the circumflex.  The plan is to continue Aggrastat for 18 hours post procedure.   RECOMMENDATION: DAPT indefinitely.  We will continue Aggrastat 18 hours postinfusion.  Optimize blood pressure.  Plan 2D echo Doppler study.  Continue carvedilol, consider ARB and transition to [facility] if LV function remains impaired.  Continue Praluent/Zetia with statin intolerance and probable familial hyperlipidemia.     TTE 04/24/2021    1. Septal , apical, lateral and posterior lateral hypokinesis . Left  ventricular ejection fraction, by estimation, is 30 to 35%. The left  ventricle has moderately decreased function. The left ventricle  demonstrates regional wall motion abnormalities  (see scoring diagram/findings for description). The  left ventricular  internal cavity size was moderately dilated. There is mild left  ventricular hypertrophy. Left ventricular diastolic  parameters are  indeterminate.   2. Right ventricular systolic function is normal. The right ventricular  size is normal. There is moderately elevated pulmonary artery systolic  pressure.   3. Left atrial size was mildly dilated.   4. The mitral valve is abnormal. Moderate to severe mitral valve  regurgitation. No evidence of mitral stenosis. Severe mitral annular  calcification.   5. Tricuspid valve regurgitation is moderate.   6. The aortic valve is normal in structure. There is moderate  calcification of the aortic valve. There is moderate thickening of the  aortic valve. Aortic valve regurgitation is mild to moderate. Mild aortic  valve stenosis.   7. The inferior vena cava is normal in size with greater than 50%  respiratory variability, suggesting right atrial pressure of 3 mmHg.    Patient Profile     81 y.o. female with CAD status post PCI to the RCA, hypertension, CKD stage IV, anemia, diabetes, sleep apnea, carotid artery disease who was admitted 04/23/2021 for inferolateral STEMI.  Course has been complicated by severe mitral valve regurgitation, AKI, systolic heart failure and anemia.   Assessment & Plan    1. 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 Weight stable. Scr ranging between 2.1-2.4. Watch closely. - Continue Toprol 12.5mg  daily - No ACE/ARB/ARNI/MAR with AKI for now with AKI. Hopefully soon - Continue bidil 1 tid - consider SGLT2i soon if GFR > 20. GFR borderline at 21.   2. CAD with Inferolateral ST elevation myocardial infarction:  Late presentation STEMI that has compounded her chronic moderate mitral regurgitation.  - No s/s angina - Continue DAPT, statin and b-blocker - CR to follow  3.  Severe mitral regurgitation: She is tenuous. Repeat  TEE planned for this afternoon for Mitraclip purposes.  4.  AKI on CKD 4  -  baseline Scr ~1.3-1.5 - Scr up slightly 2.15>2.35, 2.1-2.4 this past week. Daily labs. - SBP stable - Cardiac output ok on RHC. Suspect ATN/contrast - continue supportive care. Avoid nephrotoxic agents.        FINCH, LINDSAY N, PA-C  05/16/2021, 8:57 AM     Patient seen and examined with the above-signed Advanced Practice Provider and/or Housestaff. I personally reviewed laboratory data, imaging studies and relevant notes. I independently examined the patient and formulated the important aspects of the plan. I have edited the note to reflect any of my changes or salient points. I have personally discussed the plan with the patient and/or family.   She looks good today. Denies CP, SOB orthopnea or PND. Scr stabilized around 2.1-2.4. Volume status looks ok. For TEE today.    General:  Sitting in chair. No resp difficulty HEENT: normal Neck: supple. no JVD. Carotids 2+ bilat; no bruits. No lymphadenopathy or thryomegaly appreciated. Cor: PMI nondisplaced. Regular rate & rhythm. 2/6 MR Lungs: clear Abdomen: soft, nontender, nondistended. No hepatosplenomegaly. No bruits or masses. Good bowel sounds. Extremities: no cyanosis, clubbing, rash, edema Neuro: alert & orientedx3, cranial nerves grossly intact. moves all 4 extremities w/o difficulty. Affect pleasant   Stable from HF perspective. For TEE today to look at MV. Will d/w with Structural Team after.    Glori Bickers, MD  12:38 PM

## 2021-05-16 NOTE — Anesthesia Preprocedure Evaluation (Addendum)
Anesthesia Evaluation  Patient identified by MRN, date of birth, ID band Patient awake    Reviewed: Allergy & Precautions, NPO status , Patient's Chart, lab work & pertinent test results  History of Anesthesia Complications Negative for: history of anesthetic complications  Airway Mallampati: II  TM Distance: >3 FB Neck ROM: Full    Dental no notable dental hx. (+) Dental Advisory Given, Teeth Intact,    Pulmonary shortness of breath, sleep apnea and Continuous Positive Airway Pressure Ventilation ,    Pulmonary exam normal breath sounds clear to auscultation       Cardiovascular hypertension, Pt. on home beta blockers and Pt. on medications (-) angina+ CAD, + Past MI and + Cardiac Stents  + Valvular Problems/Murmurs MR  Rhythm:Regular Rate:Normal + Systolic murmurs 1. Septal , apical, lateral and posterior lateral hypokinesis . Left  ventricular ejection fraction, by estimation, is 30 to 35%. The left  ventricle has moderately decreased function. The left ventricle  demonstrates regional wall motion abnormalities  (see scoring diagram/findings for description). The left ventricular  internal cavity size was moderately dilated. There is mild left  ventricular hypertrophy. Left ventricular diastolic parameters are  indeterminate.  2. Right ventricular systolic function is normal. The right ventricular  size is normal. There is moderately elevated pulmonary artery systolic  pressure.  3. Left atrial size was mildly dilated.  4. The mitral valve is abnormal. Moderate to severe mitral valve  regurgitation. No evidence of mitral stenosis. Severe mitral annular  calcification.  5. Tricuspid valve regurgitation is moderate.  6. The aortic valve is normal in structure. There is moderate  calcification of the aortic valve. There is moderate thickening of the  aortic valve. Aortic valve regurgitation is mild to moderate. Mild  aortic  valve stenosis.  7. The inferior vena cava is normal in size with greater than 50%  respiratory variability, suggesting right atrial pressure of 3 mmHg.     Prox LAD to Mid LAD lesion is 70% stenosed.   Prox LAD lesion is 50% stenosed.   Mid Cx lesion is 100% stenosed.   Dist Cx lesion is 80% stenosed.   3rd Mrg lesion is 100% stenosed.   Non-stenotic Dist RCA lesion was previously treated.   Non-stenotic Mid RCA to Dist RCA lesion was previously treated.   A drug-eluting stent was successfully placed.   Post intervention, there is a 0% residual stenosis.   Post intervention, there is a 0% residual stenosis.   There is moderate left ventricular systolic dysfunction.  Acute inferolateral wall ST segment elevation secondary to total occlusion of a large left circumflex coronary artery with TIMI 0 flow.  Calcification involving the proximal LAD with 50 and 70% stenoses.  Widely patent dominant RCA with previously placed 4 tandem stents proximally to distally without restenosis.  Moderate acute LV dysfunction with EF estimated at 35 to 40% with hypocontractility involving the mid distal anterolateral wall.  LVEDP 30 mm  Successful percutaneous coronary intervention to the left circumflex coronary artery with ultimate insertion of a 2.75 x 26 mm Medtronic Onyx frontiers stent postdilated to 3.04 mm with the 100% occlusion being reduced to 0% and brisk TIMI-3 flow in the major vessel.  There is residual thrombus and a small marginal branch arising from the stented segment which was totally occluded initially.  There also was faint thrombus in a very distal branch of the circumflex.  The plan is to continue Aggrastat for 18 hours post procedure.    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 10/2015 Carotid U/S: < 50% BICA stenosis  Neuromuscular disease negative psych ROS   GI/Hepatic Neg liver ROS, GERD  Medicated,  Endo/Other   diabetes, Type 2Hypothyroidism Lab Results      Component                Value               Date                      HGBA1C                   6.0 (H)             04/25/2021             Renal/GU Renal InsufficiencyRenal disease      Musculoskeletal  (+) Arthritis ,   Abdominal   Peds  Hematology  (+) Blood dyscrasia, anemia ,     Anesthesia Other Findings   Reproductive/Obstetrics                          Anesthesia Physical  Anesthesia Plan  ASA: 4  Anesthesia Plan: MAC   Post-op Pain Management: Minimal or no pain anticipated   Induction: Intravenous  PONV Risk Score and Plan: 2 and Treatment may vary due to age or medical condition  Airway Management Planned: Nasal Cannula, Natural Airway, Simple Face Mask and Mask  Additional Equipment: None  Intra-op Plan:   Post-operative Plan:   Informed Consent: I have reviewed the patients History and Physical, chart, labs and discussed the procedure including the risks, benefits and alternatives for the proposed anesthesia with the patient or authorized representative who has indicated his/her understanding and acceptance.     Dental advisory given  Plan Discussed with: CRNA and Anesthesiologist  Anesthesia Plan Comments: (RIGHT heart cath Findings: Ao (non-invasive) = 132/77 RA = 3 RV = 60/8 PA = 55/21 (37) PCW = 22 (v waves to 40) Thermo cardiac output/index = 3.3/2.0 Fick CO/CI = 5.0/3.0 PVR = 4.5 (Fick) 3.0 (Thermal) FA sat = 98% PA sat = 63%, 64% PAPi  = 11   Assessment:  1. Elevated filling pressure with prominent v-waves in PW tracing suggestive of significant MR 2. Preserved CO by Fick (reduced by thermo) 3. Inability to pass swan wire and catheter into the left PA (Patient developed cough but no hemoptysis)  RHC suggestive of significant mitral regurgitation. D/w Dr. Ali Lowe in the cath lab. )      Anesthesia Quick Evaluation

## 2021-05-16 NOTE — TOC Benefit Eligibility Note (Signed)
Patient Teacher, English as a foreign language completed.    The patient is currently admitted and upon discharge could be taking Brilinta 90 mg.  The current 30 day co-pay is, $47.00.   The patient is insured through [employer] Patient Advocate Specialist [place] Patient Advocate Team Direct Number: [phone]  Fax: [phone]

## 2021-05-17 ENCOUNTER — Other Ambulatory Visit (HOSPITAL_COMMUNITY): Payer: Self-pay

## 2021-05-17 ENCOUNTER — Encounter (HOSPITAL_COMMUNITY): Payer: Self-pay | Admitting: Internal Medicine

## 2021-05-17 DIAGNOSIS — I5023 Acute on chronic systolic (congestive) heart failure: Secondary | ICD-10-CM | POA: Diagnosis not present

## 2021-05-17 LAB — GLUCOSE, CAPILLARY
Glucose-Capillary: 168 mg/dL — ABNORMAL HIGH (ref 70–99)
Glucose-Capillary: 287 mg/dL — ABNORMAL HIGH (ref 70–99)
Glucose-Capillary: 251 mg/dL — ABNORMAL HIGH (ref 70–99)
Glucose-Capillary: 87 mg/dL (ref 70–99)

## 2021-05-17 LAB — CBC
HCT: 28.6 % — ABNORMAL LOW (ref 36.0–46.0)
Hemoglobin: 9.2 g/dL — ABNORMAL LOW (ref 12.0–15.0)
MCH: 28.1 pg (ref 26.0–34.0)
MCHC: 32.2 g/dL (ref 30.0–36.0)
MCV: 87.5 fL (ref 80.0–100.0)
Platelets: 289 10*3/uL (ref 150–400)
RBC: 3.27 MIL/uL — ABNORMAL LOW (ref 3.87–5.11)
RDW: 21.7 % — ABNORMAL HIGH (ref 11.5–15.5)
WBC: 4.6 10*3/uL (ref 4.0–10.5)
nRBC: 0 % (ref 0.0–0.2)

## 2021-05-17 LAB — POCT I-STAT EG7
Acid-Base Excess: 0 mmol/L (ref 0.0–2.0)
Acid-Base Excess: 2 mmol/L (ref 0.0–2.0)
Bicarbonate: 24.8 mmol/L (ref 20.0–28.0)
Bicarbonate: 26.9 mmol/L (ref 20.0–28.0)
Calcium, Ion: 1.01 mmol/L — ABNORMAL LOW (ref 1.15–1.40)
Calcium, Ion: 1.11 mmol/L — ABNORMAL LOW (ref 1.15–1.40)
HCT: 28 % — ABNORMAL LOW (ref 36.0–46.0)
HCT: 30 % — ABNORMAL LOW (ref 36.0–46.0)
Hemoglobin: 10.2 g/dL — ABNORMAL LOW (ref 12.0–15.0)
Hemoglobin: 9.5 g/dL — ABNORMAL LOW (ref 12.0–15.0)
O2 Saturation: 62 %
O2 Saturation: 66 %
Potassium: 3.2 mmol/L — ABNORMAL LOW (ref 3.5–5.1)
Potassium: 3.5 mmol/L (ref 3.5–5.1)
Sodium: 138 mmol/L (ref 135–145)
Sodium: 142 mmol/L (ref 135–145)
TCO2: 26 mmol/L (ref 22–32)
TCO2: 28 mmol/L (ref 22–32)
pCO2, Ven: 39.6 mmHg — ABNORMAL LOW (ref 44–60)
pCO2, Ven: 41.9 mmHg — ABNORMAL LOW (ref 44–60)
pH, Ven: 7.405 (ref 7.25–7.43)
pH, Ven: 7.416 (ref 7.25–7.43)
pO2, Ven: 32 mmHg (ref 32–45)
pO2, Ven: 34 mmHg (ref 32–45)

## 2021-05-17 LAB — BASIC METABOLIC PANEL
Anion gap: 12 (ref 5–15)
BUN: 68 mg/dL — ABNORMAL HIGH (ref 8–23)
CO2: 23 mmol/L (ref 22–32)
Calcium: 8.6 mg/dL — ABNORMAL LOW (ref 8.9–10.3)
Chloride: 102 mmol/L (ref 98–111)
Creatinine, Ser: 2.29 mg/dL — ABNORMAL HIGH (ref 0.44–1.00)
GFR, Estimated: 21 mL/min — ABNORMAL LOW (ref >60)
Glucose, Bld: 129 mg/dL — ABNORMAL HIGH (ref 70–99)
Potassium: 4 mmol/L (ref 3.5–5.1)
Sodium: 137 mmol/L (ref 135–145)

## 2021-05-17 LAB — MAGNESIUM: Magnesium: 2.5 mg/dL — ABNORMAL HIGH (ref 1.7–2.4)

## 2021-05-17 LAB — URIC ACID: Uric Acid, Serum: 14.9 mg/dL — ABNORMAL HIGH (ref 2.5–7.1)

## 2021-05-17 MED ORDER — HYDRALAZINE HCL 25 MG PO TABS
37.5000 mg | ORAL_TABLET | Freq: Three times a day (TID) | ORAL | Status: DC
  Administered 2021-05-17 – 2021-05-18 (×3): 37.5 mg via ORAL
  Filled 2021-05-17 (×4): qty 2

## 2021-05-17 MED ORDER — METOPROLOL SUCCINATE ER 25 MG PO TB24: 12.5000 mg | ORAL_TABLET | Freq: Every day | ORAL | 6 refills | Status: AC

## 2021-05-17 MED ORDER — HYDRALAZINE HCL 25 MG PO TABS: 37.5000 mg | ORAL_TABLET | Freq: Three times a day (TID) | ORAL | 6 refills | Status: AC

## 2021-05-17 MED ORDER — ISOSORBIDE MONONITRATE ER 30 MG PO TB24: 30.0000 mg | ORAL_TABLET | Freq: Once | ORAL | Status: AC

## 2021-05-17 MED ORDER — ISOSORBIDE MONONITRATE ER 60 MG PO TB24
60.0000 mg | ORAL_TABLET | Freq: Every day | ORAL | Status: DC
  Administered 2021-05-18: 60 mg via ORAL
  Filled 2021-05-17: qty 1

## 2021-05-17 MED ORDER — FUROSEMIDE 10 MG/ML IJ SOLN
60.0000 mg | Freq: Once | INTRAMUSCULAR | Status: AC
  Administered 2021-05-17: 60 mg via INTRAVENOUS
  Filled 2021-05-17: qty 6

## 2021-05-17 MED ORDER — EMPAGLIFLOZIN 10 MG PO TABS: 10.0000 mg | ORAL_TABLET | Freq: Every day | ORAL | 6 refills | Status: DC

## 2021-05-17 MED ORDER — ISOSORBIDE MONONITRATE ER 60 MG PO TB24: 60.0000 mg | ORAL_TABLET | Freq: Every day | ORAL | 6 refills | Status: AC

## 2021-05-17 MED ORDER — PREDNISONE 20 MG PO TABS
40.0000 mg | ORAL_TABLET | Freq: Every day | ORAL | Status: DC
  Administered 2021-05-17 – 2021-05-18 (×2): 40 mg via ORAL
  Filled 2021-05-17 (×2): qty 2

## 2021-05-17 MED ORDER — TICAGRELOR 90 MG PO TABS: 90.0000 mg | ORAL_TABLET | Freq: Two times a day (BID) | ORAL | 6 refills | Status: DC

## 2021-05-17 NOTE — Progress Notes (Signed)
CARDIAC REHAB PHASE I   PRE:  Rate/Rhythm: 93 SR    BP: sitting 116/52    SaO2: 95 RA  MODE:  Ambulation: 330 ft   POST:  Rate/Rhythm: 121 ST    BP: sitting 138/65     SaO2: 98 RA  Pt feeling well in room but more SOB today than yesterday walking. Same distance, very dyspneic, she rates 4/4, at halfway. Rested then returned to room. Again, significant SOB. Encouraged PLB. Pt c/o chest tightness once sitting that lasted 2-3 minutes. Resolved with rest and pursed lip breathing.   Gave pt HF booklet and encouraged her to read. Will f/u to discuss. [place], ACSM 05/17/2021 12:02 PM

## 2021-05-17 NOTE — Discharge Summary (Addendum)
Advanced Heart Failure Team  Discharge Summary   Patient ID: Karen West MRN: [id number], DOB/AGE: 79-05-1940 79 y.o. Admit date: 04/23/2021 D/C date:     05/18/2021   Primary Discharge Diagnoses:  A/C HFrHF CAD with Inferolateral ST elevation MI Severe MR  AKI on CKD Stage IV Foot pain  Hospital Course:   81 y.o. female with CAD status post PCI to the RCA, hypertension, CKD stage IV, anemia, diabetes, sleep apnea, and carotid artery disease .  Admitted 04/23/2021 for inferolateral STEMI.  Course has been complicated by severe mitral valve regurgitation, AKI, systolic heart failure and anemia. Underwent cath on 2/4 with multivessel coronary disease requiring PCI /DES  CFX with medical therapy for 70% LAD. Plan to continue DAPT + praluent and zetia. Intolerant statins.   Initial EF 30-35% but TEE 40-45% with severe mitral regurgitation.  Diuresed with IV lasix. Placed on bb, hydralazine/imdur,  and SGLT2i. No diuretics with addition of SGLT2i. Plan to try an manage medically.   Had foot pain. Uric acid 14.9. Started on prednisone 40 mg daily x 4 days.   She has f/u with structural heart team next week and will be seen in the HF clinic in 2 weeks.  Plan to send to [facility] to consider mitral valve replacement.   Please see below for detailed problem list.   1. Acute on chronic systolic heart failure:  - due to iCM EF 30-35% and severe MR - TEE 35-40% severe MR - Diuresed with IV lasix.  - Continue Jardiance 10 (no longer able to take metformin with low GFR) - Continue Toprol 12.5mg  daily - No ACE/ARB/ARNI/MAR with AKI for now with AKI. Hopefully soon - Continue bidil 1 tid - Will follow in HF Clinic in 2 weeks.    2. CAD with Inferolateral ST elevation myocardial infarction:  Late presentation STEMI that has compounded her chronic moderate mitral regurgitation.  -- Continue DAPT, statin and b-blocker  3.  Severe mitral regurgitation:  -  TEE 2/27 reviewed EF 35-40% severe MR. -  Reviewed with Structural Team. Not candidate for MV Clip due to perivalvular calcium and short posterior leaflet.  Will f/u with Dr Ali Lowe with possible referral to [facility] for percutaneous MVR  4.  AKI on CKD 4  -  baseline Scr ~1.3-1.5 - Renal function followed closely. Creatinine 2.3  - Cardiac output ok on RHC. Suspect ATN/contrast - continue supportive care.  - Start SGLT2i   5. Acute Gout Flare -Uric Acid 14.9  -Started on prednisone 40 mg daily x 4 day.   Discharge Vitals: Blood pressure 115/76, pulse 89, temperature 97.6 F (36.4 C), temperature source Oral, resp. rate 16, height 5\' 1"  (1.549 m), weight 65.2 kg, SpO2 98 %.  Labs: Lab Results  Component Value Date   WBC 4.6 05/17/2021   HGB 9.2 (L) 05/17/2021   HCT 28.6 (L) 05/17/2021   MCV 87.5 05/17/2021   PLT 289 05/17/2021    Recent Labs  Lab 05/18/21 0246  NA 135  K 3.9  CL 102  CO2 21*  BUN 71*  CREATININE 2.14*  CALCIUM 8.6*  GLUCOSE 231*   Lab Results  Component Value Date   CHOL 123 04/28/2021   HDL 45 04/28/2021   LDLCALC 58 04/28/2021   TRIG 99 04/28/2021   BNP (last 3 results) Recent Labs    04/27/21 0349  BNP 1,077.8*    ProBNP (last 3 results) No results for input(s): PROBNP in the last 8760 hours.   Diagnostic  Studies/Procedures  LHC 04/23/2021    Prox LAD to Mid LAD lesion is 70% stenosed.   Prox LAD lesion is 50% stenosed.   Mid Cx lesion is 100% stenosed.   Dist Cx lesion is 80% stenosed.   3rd Mrg lesion is 100% stenosed.   Non-stenotic Dist RCA lesion was previously treated.   Non-stenotic Mid RCA to Dist RCA lesion was previously treated.   A drug-eluting stent was successfully placed.   Post intervention, there is a 0% residual stenosis.   Post intervention, there is a 0% residual stenosis.   There is moderate left ventricular systolic dysfunction. Acute inferolateral wall ST segment elevation secondary to total occlusion of a large left circumflex coronary artery with  TIMI 0 flow.   Calcification involving the proximal LAD with 50 and 70% stenoses.   Widely patent dominant RCA with previously placed 4 tandem stents proximally to distally without restenosis.   Moderate acute LV dysfunction with EF estimated at 35 to 40% with hypocontractility involving the mid distal anterolateral wall.  LVEDP 30 mm   Successful percutaneous coronary intervention to the left circumflex coronary artery with ultimate insertion of a 2.75 x 26 mm Medtronic Onyx frontiers stent postdilated to 3.04 mm with the 100% occlusion being reduced to 0% and brisk TIMI-3 flow in the major vessel.  There is residual thrombus and a small marginal branch arising from the stented segment which was totally occluded initially.  There also was faint thrombus in a very distal branch of the circumflex.  The plan is to continue Aggrastat for 18 hours post procedure.   RECOMMENDATION: DAPT indefinitely.  We will continue Aggrastat 18 hours postinfusion.  Optimize blood pressure.  Plan 2D echo Doppler study.  Continue carvedilol, consider ARB and transition to [facility] if LV function remains impaired.  Continue Praluent/Zetia with statin intolerance and probable familial hyperlipidemia.     TTE 04/24/2021   1. Septal , apical, lateral and posterior lateral hypokinesis . Left  ventricular ejection fraction, by estimation, is 30 to 35%. The left  ventricle has moderately decreased function. The left ventricle  demonstrates regional wall motion abnormalities  (see scoring diagram/findings for description). The left ventricular  internal cavity size was moderately dilated. There is mild left  ventricular hypertrophy. Left ventricular diastolic parameters are  indeterminate.   2. Right ventricular systolic function is normal. The right ventricular  size is normal. There is moderately elevated pulmonary artery systolic  pressure.   3. Left atrial size was mildly dilated.   4. The mitral valve is abnormal.  Moderate to severe mitral valve  regurgitation. No evidence of mitral stenosis. Severe mitral annular  calcification.   5. Tricuspid valve regurgitation is moderate.   6. The aortic valve is normal in structure. There is moderate  calcification of the aortic valve. There is moderate thickening of the  aortic valve. Aortic valve regurgitation is mild to moderate. Mild aortic  valve stenosis.   7. The inferior vena cava is normal in size with greater than 50%  respiratory variability, suggesting right atrial pressure of 3 mmHg.    ECHO TEE  Result Date: 05/16/2021    TRANSESOPHOGEAL ECHO REPORT   Patient Name:   Karen West Date of Exam: 05/16/2021 Medical Rec #:  [id number]        Height:       61.0 in Accession #:    [id number]       Weight:       143.3 lb Date  of Birth:  06-28-1940         BSA:          1.639 m Patient Age:    3 years         BP:           132/80 mmHg Patient Gender: F                HR:           88 bpm. Exam Location:  Inpatient Procedure: Transesophageal Echo, 3D Echo, Color Doppler and Cardiac Doppler Indications:     I34.0 Nonrheumatic mitral (valve) insufficiency  History:         Patient has prior history of Echocardiogram examinations, most                  recent 05/09/2021. Risk Factors:Hypertension, Diabetes,                  Dyslipidemia and Sleep Apnea.  Sonographer:     Raquel Sarna Senior RDCS Referring Phys:  [id number] [facility] A Gasper Sells Diagnosing Phys: Rudean Haskell MD PROCEDURE: After discussion of the risks and benefits of a TEE, an informed consent was obtained from the patient. The transesophogeal probe was passed without difficulty through the esophogus of the patient. Sedation performed by different physician. The patient was monitored while under deep sedation. Anesthestetic sedation was provided intravenously by Anesthesiology: 188mg  of Propofol. The patient developed no complications during the procedure. IMPRESSIONS  1. Left ventricular ejection  fraction, by estimation, is 35 to 40%. Left ventricular ejection fraction by 3D volume is 35 %. The left ventricle has moderately decreased function. Anterolateral wall motion abdnormalities.  2. The mitral valve is degenerative. Severe mitral valve regurgitation there is a functional componet after LCX infarct associated WMA and calcified posterior leaflet. There is pulmonary vein systolic flow reversal of the left sized veins. 3D Vena contracta area 0.42 cm2. Mean gradient of 2 mm Hg. There posterior leaflet length of 1.15 cm as best and the MVA by 3D planimetry of 3.83 cm2 are poor prognostic signs for TEER interventions.  3. Right ventricular systolic function is normal. The right ventricular size is normal.  4. Left atrial size was dilated. No left atrial/left atrial appendage thrombus was detected.  5. A small pericardial effusion is present. The pericardial effusion is anterior to the right ventricle.  6. The aortic valve is calcified. Aortic valve regurgitation is mild. Moderate aortic valve stenosis.  7. There is Moderate (Grade III) plaque involving the descending aorta. Comparison(s): Mitral regurgitation appears worse from prior study. FINDINGS  Left Ventricle: Left ventricular ejection fraction, by estimation, is 35 to 40%. Left ventricular ejection fraction by 3D volume is 35 %. The left ventricle has moderately decreased function. The left ventricular internal cavity size was normal in size.  LV Wall Scoring: The entire lateral wall is hypokinetic. Right Ventricle: The right ventricular size is normal. No increase in right ventricular wall thickness. Right ventricular systolic function is normal. Left [facility]: Left atrial size was dilated. No left atrial/left atrial appendage thrombus was detected. Right [facility]: Right atrial size was normal in size. Prominent Chiari network. Pericardium: A small pericardial effusion is present. The pericardial effusion is anterior to the right ventricle. Mitral Valve:  The mitral valve is degenerative in appearance. Severe mitral valve regurgitation. MV peak gradient, 5.5 mmHg. The mean mitral valve gradient is 2.0 mmHg. Tricuspid Valve: The tricuspid valve is normal in structure. Tricuspid valve regurgitation is trivial. No  evidence of tricuspid stenosis. Aortic Valve: The aortic valve is calcified. Aortic valve regurgitation is mild. Moderate aortic stenosis is present. Pulmonic Valve: The pulmonic valve was grossly normal. Pulmonic valve regurgitation is not visualized. Aorta: The aortic root and ascending aorta are structurally normal, with no evidence of dilitation. There is moderate (Grade III) plaque involving the descending aorta. IAS/Shunts: No atrial level shunt detected by color flow Doppler.  LEFT VENTRICLE PLAX 2D LVOT diam:     1.80 cm LV SV:         36 LV SV Index:   22              3D Volume EF LVOT Area:     2.54 cm        LV 3D EF:    Left                                             ventricul                                             ar                                             ejection                                             fraction                                             by 3D                                             volume is                                             35 %.                                LV 3D EDV:   92.87 ml                                LV 3D ESV:   59.96 ml                                 3D Volume EF:                                3D EF:  35 % AORTIC VALVE LVOT Vmax:   83.60 cm/s LVOT Vmean:  55.100 cm/s LVOT VTI:    0.141 m MITRAL VALVE MV Area VTI:  1.36 cm        SHUNTS MV Peak grad: 5.5 mmHg        Systemic VTI:  0.14 m MV Mean grad: 2.0 mmHg        Systemic Diam: 1.80 cm MV Vmax:      1.17 m/s MV Vmean:     73.0 cm/s MR Peak grad:    130.0 mmHg MR Mean grad:    81.0 mmHg MR Vmax:         570.00 cm/s MR Vmean:        419.0 cm/s MR PISA:         3.08 cm MR PISA Eff ROA: 21 mm MR PISA Radius:  0.70 cm Rudean Haskell MD Electronically signed by Rudean Haskell MD Signature Date/Time: 05/16/2021/2:30:51 PM    Final     Discharge Medications   Allergies as of 05/18/2021       Reactions   Tramadol Nausea And Vomiting, Other (See Comments)   "got sick to my stomach and was throwing up blood; it put me in the hospital")   Lisinopril Rash, Other (See Comments)   Renal failure   Rosuvastatin Other (See Comments)   Other reaction(s): rhabdo   Tapazole [methimazole] Itching, Rash        Medication List     STOP taking these medications    amLODipine 10 MG tablet Commonly known as: NORVASC   carvedilol 12.5 MG tablet Commonly known as: COREG   metoprolol tartrate 50 MG tablet Commonly known as: LOPRESSOR   Trijardy XR 12.5-2.07-998 MG Tb24 Generic drug: Empagliflozin-Linaglip-Metform       TAKE these medications    aspirin EC 81 MG tablet Take 81 mg by mouth daily. Swallow whole.   empagliflozin 10 MG Tabs tablet Commonly known as: Jardiance Take 1 tablet (10 mg total) by mouth daily before breakfast.   ezetimibe 10 MG tablet Commonly known as: ZETIA Take 10 mg by mouth every evening.   ferrous sulfate 325 (65 FE) MG tablet Take 325 mg by mouth 2 (two) times daily.   Fish Oil 1200 MG Caps Take 1,200 mg by mouth 2 (two) times daily.   hydrALAZINE 25 MG tablet Commonly known as: APRESOLINE Take 1.5 tablets (37.5 mg total) by mouth every 8 (eight) hours.   hydrocortisone cream 1 % Apply 1 application topically 2 (two) times daily as needed for itching.   isosorbide mononitrate 60 MG 24 hr tablet Commonly known as: IMDUR Take 1 tablet (60 mg total) by mouth daily.   levothyroxine 88 MCG tablet Commonly known as: SYNTHROID Take 88 mcg by mouth daily before breakfast.   metoprolol succinate 25 MG 24 hr tablet Commonly known as: TOPROL-XL Take 0.5 tablets (12.5 mg total) by mouth at bedtime.   nitroGLYCERIN 0.4 MG SL tablet Commonly known as:  NITROSTAT Place 1 tablet (0.4 mg total) under the tongue every 5 (five) minutes as needed for chest pain.   omeprazole 20 MG capsule Commonly known as: PRILOSEC Take 20 mg by mouth 2 (two) times daily.   Praluent 150 MG/ML Soaj Generic drug: Alirocumab Inject 150 mg into the skin every 14 (fourteen) days.   predniSONE 20 MG tablet Commonly known as: DELTASONE Take 2 tablets (40 mg total) by mouth daily with breakfast. Start taking on: May 19, 2021  ticagrelor 90 MG Tabs tablet Commonly known as: BRILINTA Take 1 tablet (90 mg total) by mouth 2 (two) times daily.   vitamin B-12 1000 MCG tablet Commonly known as: CYANOCOBALAMIN Take 1,000 mcg by mouth daily.   Vitamin D3 50 MCG (2000 UT) Tabs Take 2,000 Units by mouth daily.        Disposition   The patient will be discharged in stable condition to home. Discharge Instructions     (HEART FAILURE PATIENTS) Call MD:  Anytime you have any of the following symptoms: 1) 3 pound weight gain in 24 hours or 5 pounds in 1 week 2) shortness of breath, with or without a dry hacking cough 3) swelling in the hands, feet or stomach 4) if you have to sleep on extra pillows at night in order to breathe.   Complete by: As directed    AMB Referral to Advanced Lipid Disorders Clinic   Complete by: As directed    Internal Lipid Clinic Referral Scheduling  Internal lipid clinic referrals are providers within [facility], who wish to refer established patients for routine management (help in starting PCSK9 inhibitor therapy) or advanced therapies.  Internal MD referral criteria:              1. All patients with LDL>190 mg/dL  2. All patients with Triglycerides >500 mg/dL  3. Patients with suspected or confirmed heterozygous familial hyperlipidemia (HeFH) or homozygous familial hyperlipidemia (HoFH)  4. Patients with family history of suspicious for genetic dyslipidemia desiring genetic testing  5. Patients refractory to standard guideline based  therapy  6. Patients with statin intolerance (failed 2 statins, one of which must be a high potency statin)  7. Patients who the provider desires to be seen by MD   Internal PharmD referral criteria:   1. Follow-up patients for medication management  2. Follow-up for compliance monitoring  3. Patients for drug education  4. Patients with statin intolerance  5. PCSK9 inhibitor education and prior authorization approvals  6. Patients with triglycerides <500 mg/dL  External Lipid Clinic Referral  External lipid clinic referrals are for providers outside of [facility], considered new clinic patients - automatically routed to MD schedule   Amb Referral to Cardiac Rehabilitation   Complete by: As directed    Diagnosis:  Coronary Stents STEMI     After initial evaluation and assessments completed: Virtual Based Care may be provided alone or in conjunction with Phase 2 Cardiac Rehab based on patient barriers.: Yes   Diet - low sodium heart healthy   Complete by: As directed    Diet - low sodium heart healthy   Complete by: As directed    Heart Failure patients record your daily weight using the same scale at the same time of day   Complete by: As directed    Heart Failure patients record your daily weight using the same scale at the same time of day   Complete by: As directed    Increase activity slowly   Complete by: As directed    Increase activity slowly   Complete by: As directed        Follow-up Information     Early Osmond, MD Follow up on 05/23/2021.   Specialty: Cardiology Why: New consult at 10:20am. Please arrive by 10:00am. Contact information: [address] [place] [address] [place] Follow up on 05/31/2021.   Specialty:  Cardiology Why: at  10:30 Bring all medications. Contact information: [address] [id number] [place] [phone]        [facility] Follow [facility].   Specialty: Home Health Services Why: contact number # [place] and RN -agency will call to arrange appointments                  Duration of Discharge Encounter: Greater than 35 minutes   Signed, Amy Clegg NP-C  05/18/2021, 9:42 AM  Patient seen and examined with the above-signed Advanced Practice Provider and/or Housestaff. I personally reviewed laboratory data, imaging studies and relevant notes. I independently examined the patient and formulated the important aspects of the plan. I have edited the note to reflect any of my changes or salient points. I have personally discussed the plan with the patient and/or family.  Improved today. Needs close f/u with [place] and Structural team.   Glori Bickers, MD  10:08 PM

## 2021-05-17 NOTE — Progress Notes (Signed)
Discussed with pt HF booklet, which she had read today. Receptive. Will f/u tomorrow. [phone] Yves Dill CES, ACSM 3:00 PM 05/17/2021

## 2021-05-17 NOTE — Progress Notes (Signed)
ReDS elevated at 37%. Will give 60 mg lasix IV this afternoon.   Currently comfortable at rest. Sitting on side of bed.  Reassess volume status in am.

## 2021-05-17 NOTE — Care Management Important Message (Signed)
Important Message  Patient Details  Name: Karen West MRN: [id number] Date of Birth: 05/07/1940   Medicare Important Message Given:  Yes     [name] 05/17/2021, 9:32 AM

## 2021-05-17 NOTE — Progress Notes (Addendum)
Progress Note  Patient Name: Karen West Date of Encounter: 05/17/2021  [facility] Cardiologist: Minus Breeding, MD   Subjective   TEE yesterday reviewed EF 35-40% severe MR.   Reviewed with Structural Team. Not candidate for MV Clip due to perivalvular calcium and short posterior leaflet.   Denies CP or SOB. [place] stable 2.3-2.4  C/o pain across her toes    Inpatient Medications    Scheduled Meds:  aspirin EC  81 mg Oral Daily   enoxaparin (LOVENOX) injection  30 mg Subcutaneous Q24H   ezetimibe  10 mg Oral Daily   feeding supplement (GLUCERNA SHAKE)  237 mL Oral TID BM   ferrous sulfate  325 mg Oral BID WC   furosemide  40 mg Oral Daily   hydrALAZINE  37.5 mg Oral Q8H   insulin aspart  0-15 Units Subcutaneous TID WC   isosorbide mononitrate  30 mg Oral Once   [START ON 05/18/2021] isosorbide mononitrate  60 mg Oral Daily   levothyroxine  88 mcg Oral Q0600   metoprolol succinate  12.5 mg Oral QHS   pantoprazole  40 mg Oral Daily   ticagrelor  90 mg Oral BID   Continuous Infusions:   PRN Meds: acetaminophen, diazepam, labetalol, nitroGLYCERIN   Vital Signs    Vitals:   05/16/21 1636 05/16/21 2031 05/16/21 2154 05/17/21 0526  BP: (!) 97/54 116/61 124/73 106/60  Pulse: 97 90 93 89  Resp: 18 18 18 16   Temp: 97.6 F (36.4 C) 98.5 F (36.9 C) 98.3 F (36.8 C) (!) 97.3 F (36.3 C)  TempSrc: Oral Oral Oral Oral  SpO2: 97% 98%    Weight:      Height:        Intake/Output Summary (Last 24 hours) at 05/17/2021 1107 Last data filed at 05/17/2021 1027 Gross per 24 hour  Intake 1240 ml  Output 1700 ml  Net -460 ml    Last 3 Weights 05/16/2021 05/15/2021 05/14/2021  Weight (lbs) 143 lb 4.8 oz 143 lb 4.8 oz 143 lb 15.4 oz  Weight (kg) 65 kg 65 kg 65.3 kg      Telemetry   Sinus 80-90s Personally reviewed    Physical Exam   General:  Well appearing. No resp difficulty HEENT: normal Neck: supple. no JVD. Carotids 2+ bilat; no bruits. No  lymphadenopathy or thryomegaly appreciated. Cor: PMI nondisplaced. Regular rate & rhythm. Soft MR Lungs: clear Abdomen: soft, nontender, nondistended. No hepatosplenomegaly. No bruits or masses. Good bowel sounds. Extremities: no cyanosis, clubbing, rash, edema Neuro: alert & orientedx3, cranial nerves grossly intact. moves all 4 extremities w/o difficulty. Affect pleasant   Labs    High Sensitivity Troponin:   Recent Labs  Lab 04/23/21 1000 04/23/21 1222  TROPONINIHS 22,485* >24,000*      Chemistry Recent Labs  Lab 05/15/21 0247 05/16/21 0338 05/17/21 0222  NA 137 137 137  K 4.0 3.4* 4.0  CL 102 100 102  CO2 17* 25 23  GLUCOSE 165* 140* 129*  BUN 73* 70* 68*  CREATININE 2.15* 2.35* 2.29*  CALCIUM 8.8* 8.5* 8.6*  MG 2.0 1.9 2.5*  GFRNONAA 23* 21* 21*  ANIONGAP 18* 12 12     Lipids No results for input(s): CHOL, TRIG, HDL, LABVLDL, LDLCALC, CHOLHDL in the last 168 hours.  Hematology Recent Labs  Lab 05/11/21 0120 05/17/21 0222  WBC 7.3 4.6  RBC 3.52* 3.27*  HGB 9.5* 9.2*  HCT 30.7* 28.6*  MCV 87.2 87.5  MCH 27.0 28.1  MCHC 30.9 32.2  RDW 21.5* 21.7*  PLT 339 289    Thyroid No results for input(s): TSH, FREET4 in the last 168 hours.  BNPNo results for input(s): BNP, PROBNP in the last 168 hours.  DDimer No results for input(s): DDIMER in the last 168 hours.   Radiology    ECHO TEE  Result Date: 05/16/2021    TRANSESOPHOGEAL ECHO REPORT   Patient Name:   LYNE KHURANA Date of Exam: 05/16/2021 Medical Rec #:  [id number]        Height:       61.0 in Accession #:    [id number]       Weight:       143.3 lb Date of Birth:  [birth year]         BSA:          1.639 m Patient Age:    81 years         BP:           132/80 mmHg Patient Gender: F                HR:           88 bpm. Exam Location:  Inpatient Procedure: Transesophageal Echo, 3D Echo, Color Doppler and Cardiac Doppler Indications:     I34.0 Nonrheumatic mitral (valve) insufficiency  History:          Patient has prior history of Echocardiogram examinations, most                  recent 05/09/2021. Risk Factors:Hypertension, Diabetes,                  Dyslipidemia and Sleep Apnea.  Sonographer:     Raquel Sarna Senior RDCS Referring Phys:  [id number] [facility] A Gasper Sells Diagnosing Phys: Rudean Haskell MD PROCEDURE: After discussion of the risks and benefits of a TEE, an informed consent was obtained from the patient. The transesophogeal probe was passed without difficulty through the esophogus of the patient. Sedation performed by different physician. The patient was monitored while under deep sedation. Anesthestetic sedation was provided intravenously by Anesthesiology: 188mg  of Propofol. The patient developed no complications during the procedure. IMPRESSIONS  1. Left ventricular ejection fraction, by estimation, is 35 to 40%. Left ventricular ejection fraction by 3D volume is 35 %. The left ventricle has moderately decreased function. Anterolateral wall motion abdnormalities.  2. The mitral valve is degenerative. Severe mitral valve regurgitation there is a functional componet after LCX infarct associated WMA and calcified posterior leaflet. There is pulmonary vein systolic flow reversal of the left sized veins. 3D Vena contracta area 0.42 cm2. Mean gradient of 2 mm Hg. There posterior leaflet length of 1.15 cm as best and the MVA by 3D planimetry of 3.83 cm2 are poor prognostic signs for TEER interventions.  3. Right ventricular systolic function is normal. The right ventricular size is normal.  4. Left atrial size was dilated. No left atrial/left atrial appendage thrombus was detected.  5. A small pericardial effusion is present. The pericardial effusion is anterior to the right ventricle.  6. The aortic valve is calcified. Aortic valve regurgitation is mild. Moderate aortic valve stenosis.  7. There is Moderate (Grade III) plaque involving the descending aorta. Comparison(s): Mitral regurgitation appears  worse from prior study. FINDINGS  Left Ventricle: Left ventricular ejection fraction, by estimation, is 35 to 40%. Left ventricular ejection fraction by 3D volume is 35 %. The left ventricle has moderately decreased function. The  left ventricular internal cavity size was normal in size.  LV Wall Scoring: The entire lateral wall is hypokinetic. Right Ventricle: The right ventricular size is normal. No increase in right ventricular wall thickness. Right ventricular systolic function is normal. Left Atrium: Left atrial size was dilated. No left atrial/left atrial appendage thrombus was detected. Right Atrium: Right atrial size was normal in size. Prominent Chiari network. Pericardium: A small pericardial effusion is present. The pericardial effusion is anterior to the right ventricle. Mitral Valve: The mitral valve is degenerative in appearance. Severe mitral valve regurgitation. MV peak gradient, 5.5 mmHg. The mean mitral valve gradient is 2.0 mmHg. Tricuspid Valve: The tricuspid valve is normal in structure. Tricuspid valve regurgitation is trivial. No evidence of tricuspid stenosis. Aortic Valve: The aortic valve is calcified. Aortic valve regurgitation is mild. Moderate aortic stenosis is present. Pulmonic Valve: The pulmonic valve was grossly normal. Pulmonic valve regurgitation is not visualized. Aorta: The aortic root and ascending aorta are structurally normal, with no evidence of dilitation. There is moderate (Grade III) plaque involving the descending aorta. IAS/Shunts: No atrial level shunt detected by color flow Doppler.  LEFT VENTRICLE PLAX 2D LVOT diam:     1.80 cm LV SV:         36 LV SV Index:   22              3D Volume EF LVOT Area:     2.54 cm        LV 3D EF:    Left                                             ventricul                                             ar                                             ejection                                             fraction                                              by 3D                                             volume is                                             35 %.  LV 3D EDV:   92.87 ml                                LV 3D ESV:   59.96 ml                                 3D Volume EF:                                3D EF:        35 % AORTIC VALVE LVOT Vmax:   83.60 cm/s LVOT Vmean:  55.100 cm/s LVOT VTI:    0.141 m MITRAL VALVE MV Area VTI:  1.36 cm        SHUNTS MV Peak grad: 5.5 mmHg        Systemic VTI:  0.14 m MV Mean grad: 2.0 mmHg        Systemic Diam: 1.80 cm MV Vmax:      1.17 m/s MV Vmean:     73.0 cm/s MR Peak grad:    130.0 mmHg MR Mean grad:    81.0 mmHg MR Vmax:         570.00 cm/s MR Vmean:        419.0 cm/s MR PISA:         3.08 cm MR PISA Eff ROA: 21 mm MR PISA Radius:  0.70 cm Rudean Haskell MD Electronically signed by Rudean Haskell MD Signature Date/Time: 05/16/2021/2:30:51 PM    Final     Cardiac Studies   LHC 04/23/2021    Prox LAD to Mid LAD lesion is 70% stenosed.   Prox LAD lesion is 50% stenosed.   Mid Cx lesion is 100% stenosed.   Dist Cx lesion is 80% stenosed.   3rd Mrg lesion is 100% stenosed.   Non-stenotic Dist RCA lesion was previously treated.   Non-stenotic Mid RCA to Dist RCA lesion was previously treated.   A drug-eluting stent was successfully placed.   Post intervention, there is a 0% residual stenosis.   Post intervention, there is a 0% residual stenosis.   There is moderate left ventricular systolic dysfunction. Acute inferolateral wall ST segment elevation secondary to total occlusion of a large left circumflex coronary artery with TIMI 0 flow.   Calcification involving the proximal LAD with 50 and 70% stenoses.   Widely patent dominant RCA with previously placed 4 tandem stents proximally to distally without restenosis.   Moderate acute LV dysfunction with EF estimated at 35 to 40% with hypocontractility involving the mid distal anterolateral wall.   LVEDP 30 mm   Successful percutaneous coronary intervention to the left circumflex coronary artery with ultimate insertion of a 2.75 x 26 mm Medtronic Onyx frontiers stent postdilated to 3.04 mm with the 100% occlusion being reduced to 0% and brisk TIMI-3 flow in the major vessel.  There is residual thrombus and a small marginal branch arising from the stented segment which was totally occluded initially.  There also was faint thrombus in a very distal branch of the circumflex.  The plan is to continue Aggrastat for 18 hours post procedure.   RECOMMENDATION: DAPT indefinitely.  We will continue Aggrastat 18 hours postinfusion.  Optimize blood pressure.  Plan 2D echo Doppler study.  Continue carvedilol, consider ARB and transition to [facility] if  LV function remains impaired.  Continue Praluent/Zetia with statin intolerance and probable familial hyperlipidemia.     TTE 04/24/2021   1. Septal , apical, lateral and posterior lateral hypokinesis . Left  ventricular ejection fraction, by estimation, is 30 to 35%. The left  ventricle has moderately decreased function. The left ventricle  demonstrates regional wall motion abnormalities  (see scoring diagram/findings for description). The left ventricular  internal cavity size was moderately dilated. There is mild left  ventricular hypertrophy. Left ventricular diastolic parameters are  indeterminate.   2. Right ventricular systolic function is normal. The right ventricular  size is normal. There is moderately elevated pulmonary artery systolic  pressure.   3. Left atrial size was mildly dilated.   4. The mitral valve is abnormal. Moderate to severe mitral valve  regurgitation. No evidence of mitral stenosis. Severe mitral annular  calcification.   5. Tricuspid valve regurgitation is moderate.   6. The aortic valve is normal in structure. There is moderate  calcification of the aortic valve. There is moderate thickening of the  aortic valve. Aortic  valve regurgitation is mild to moderate. Mild aortic  valve stenosis.   7. The inferior vena cava is normal in size with greater than 50%  respiratory variability, suggesting right atrial pressure of 3 mmHg.   Patient Profile     81 y.o. female with CAD status post PCI to the RCA, hypertension, CKD stage IV, anemia, diabetes, sleep apnea, carotid artery disease who was admitted 04/23/2021 for inferolateral STEMI.  Course has been complicated by severe mitral valve regurgitation, AKI, systolic heart failure and anemia.  Assessment & Plan   1. Acute on chronic systolic heart failure:  - due to iCM EF 30-35% and severe MR - TEE 35-40% severe MR - volume status ok on lasix 40mg  po daily. Weight stable. Scr ranging between 2.1-2.4. Will stop lasix and switch to Jardiance 10 (no longer able to take metformin with low GFR) - Continue Toprol 12.5mg  daily - No ACE/ARB/ARNI/MAR with AKI for now with AKI. Hopefully soon - Continue bidil 1 tid - Will follow in HF Clinic  2. CAD with Inferolateral ST elevation myocardial infarction:  Late presentation STEMI that has compounded her chronic moderate mitral regurgitation.  - No s/s angina - Continue DAPT, statin and b-blocker - Refer CR  3.  Severe mitral regurgitation:  -  TEE 2/27 reviewed EF 35-40% severe MR. - Reviewed with Structural Team. Not candidate for MV Clip due to perivalvular calcium and short posterior leaflet.  Will f/u with Dr Ali Lowe with possible referral to [place] for percutaneous MVR  4.  AKI on CKD 4  -  baseline Scr ~1.3-1.5 - Scr up slightly 2.15>2.35, 2.1-2.4 this past week.  - SBP stable - Cardiac output ok on RHC. Suspect ATN/contrast - continue supportive care.  - Start SGLT2i  5. Toe pain - ? Gout - check uric acid  Plan home today with close f/u in HF and Structural Clinics.   Glori Bickers, MD  11:07 AM  Addendum:  Patient went to walk with CR prior to d/c. More SOB and fatigued.   Will cancel d/c.  Check ReDS. Give IV lasix as needed.   Glori Bickers, MD  12:16 PM

## 2021-05-17 NOTE — TOC Initial Note (Addendum)
Transition of Care [facility]) - Initial/Assessment Note    Patient Details  Name: Karen West MRN: [id number] Date of Birth: [birth year]  Transition of Care [facility]) CM/SW Contact:    Erenest Rasher, RN Phone Number: [phone] 05/17/2021, 12:13 PM  Clinical Narrative:                 HF TOC CM spoke to pt and states her dtr or family will assist as needed. She is independent at home. She has rolling walker and scale at home. She does weigh and cooks her meals at home. Offered choice for [facility]. Pt states she believes she had [facility] in the past. Contacted [facility] rep, Corene Cornea with new referral. Dtr will provide transportation.   Updated [place] agency pt was not dc today, will continue to follow for [facility].   344 pm Requested HH orders from attending.  Expected Discharge Plan: [place] Barriers to Discharge: No Barriers Identified   Patient Goals and CMS Choice Patient states their goals for this hospitalization and ongoing recovery are:: wants to remain independent at home CMS Medicare.gov Compare Post Acute Care list provided to:: Patient Choice offered to / list presented to : Patient  Expected Discharge Plan and Services Expected Discharge Plan: [place]   Discharge Planning Services: CM Consult Post Acute Care Choice: [place] arrangements for the past 2 months: [place] Expected Discharge Date: 05/17/21                         HH Arranged: RN, PT, OT [place] Agency: [place] ([place]) Date [place]: 05/17/21 Time [place]: 1212 Representative spoke with at [place]: Floydene Flock  Prior Living Arrangements/Services Living arrangements for the past 2 months: [place] with:: Self Patient language and need for interpreter reviewed:: Yes Do you feel safe going back to the place where you live?: Yes      Need for Family Participation in Patient  Care: No (Comment) Care giver support system in place?: No (comment) Current home services: DME Librarian, academic) Criminal Activity/Legal Involvement Pertinent to Current Situation/Hospitalization: No - Comment as needed  Activities of Daily Living Home Assistive Devices/Equipment: None ADL Screening (condition at time of admission) Patient's cognitive ability adequate to safely complete daily activities?: Yes Is the patient deaf or have difficulty hearing?: No Does the patient have difficulty seeing, even when wearing glasses/contacts?: No Does the patient have difficulty concentrating, remembering, or making decisions?: No Patient able to express need for assistance with ADLs?: Yes Does the patient have difficulty dressing or bathing?: No Independently performs ADLs?: Yes (appropriate for developmental age) Does the patient have difficulty walking or climbing stairs?: Yes Weakness of Legs: Both Weakness of Arms/Hands: None  Permission Sought/Granted Permission sought to share information with : Case Manager, Family Supports, PCP Permission granted to share information with : Yes, Verbal Permission Granted  Share Information with NAME: Tyla Burgner  Permission granted to share info w AGENCY: [place] granted to share info w Relationship: daughter  Permission granted to share info w Contact Information: [phone]  Emotional Assessment Appearance:: Appears stated age Attitude/Demeanor/Rapport: Gracious Affect (typically observed): Accepting Orientation: : Oriented to Self, Oriented to Place, Oriented to  Time, Oriented to Situation   Psych Involvement: No (comment)  Admission diagnosis:  ST elevation myocardial infarction (STEMI), unspecified artery (HCC) [I21.3] STEMI involving left circumflex  coronary artery [facility]) [I21.21] Patient Active Problem List   Diagnosis Date Noted   Nonrheumatic mitral valve regurgitation    STEMI involving left circumflex  coronary artery ([place]) 04/23/2021   Polyneuropathy due to type 2 diabetes mellitus ([place]) 02/04/2021   Chest pain 12/23/2020   Proteinuria 10/29/2020   Coronary artery disease involving native coronary artery of native heart without angina pectoris 06/22/2020   Abnormal results of liver function studies 03/05/2020   Acute non-ST segment elevation myocardial infarction ([place]) 03/05/2020   Anemia in chronic kidney disease 03/05/2020   Atherosclerosis of both carotid arteries 03/05/2020   Atrial tachycardia ([place]) 03/05/2020   Cardiomegaly 03/05/2020   Chronic kidney disease, stage 4 (severe) ([place]) 03/05/2020   Chronic constipation 03/05/2020   Cyst and pseudocyst of pancreas 03/05/2020   Cyst of kidney, acquired 03/05/2020   Dependence on other enabling machines and devices 03/05/2020   Diabetic renal disease ([place]) 03/05/2020   Diverticulosis of colon 03/05/2020   Dysgeusia 03/05/2020   Encounter for general adult medical examination without abnormal findings 03/05/2020   Fatigue 03/05/2020   Hard of hearing 03/05/2020   Headache 03/05/2020   History of musculoskeletal disease 03/05/2020   Hoarseness 03/05/2020   Hypercalcemia 03/05/2020   Hyperglycemia due to type 2 diabetes mellitus ([place]) 03/05/2020   Hypomagnesemia 03/05/2020   Long term (current) use of aspirin 03/05/2020   Increased frequency of urination 03/05/2020   Idiopathic scoliosis 03/05/2020   Myalgia 03/05/2020   Palpitations 03/05/2020   Personal history of colonic polyps 03/05/2020   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   Seborrheic keratosis 03/05/2020   Toe pain 03/05/2020   Pulmonary valve disorder 03/05/2020   Personal history of malignant neoplasm of breast 03/05/2020   Vitamin D deficiency 03/05/2020   Rhabdomyolysis 08/22/2019   Carotid artery disease (HCC)    Acute renal failure superimposed on stage 3 chronic kidney disease (HCC)    Elevated transaminase level    ST elevation myocardial infarction  involving left circumflex coronary artery ([place]) 07/02/2019   Dysphonia 05/21/2018   Pelvic floor relaxation 10/02/2017   Blood in urine 10/02/2017   Left lower quadrant pain 10/02/2017   Nausea 10/02/2017   Nocturia 10/02/2017   Osteopenia 10/02/2017   Pain in pelvis 10/02/2017   Adenocarcinoma of left lung, stage 1 ([place]) 07/26/2017   Iron deficiency anemia 07/26/2017   S/P lobectomy of lung    Long QT interval    Sinus tachycardia    Abnormal positron emission tomography (PET) scan 05/06/2017   Pain of left shoulder joint on movement 04/09/2017   Rib pain 04/09/2017   BPPV (benign paroxysmal positional vertigo), left 03/27/2017   Lipoma 04/06/2016   Hypersomnia 01/18/2016   Upper airway resistance syndrome 01/18/2016   Hyperthyroidism 10/05/2011   Chest pain at rest 10/04/2011   Angina at rest [facility]) 10/04/2011   Headache(784.0) 10/04/2011   FH: MI (myocardial infarction) 10/04/2011   Chest pressure 10/04/2011   HLD (hyperlipidemia)    Hypertension    OSA on CPAP    Breast cancer (HCC)    Chronic anemia    GERD (gastroesophageal reflux disease)    Obesity    Osteoarthritis    Right bundle branch block    Atherosclerosis of aorta (HCC)    Multiple thyroid nodules    Type II or unspecified type diabetes mellitus without mention of complication, uncontrolled    Chronic headaches    OBESITY 11/17/2009   OBSTRUCTIVE SLEEP APNEA 01/05/2009   Type 2 diabetes mellitus with complication,  without long-term current use of insulin ([place]) 11/12/2008   HYPERLIPIDEMIA 11/12/2008   Essential hypertension 11/12/2008   Lung nodule 11/12/2008   G E R D 11/12/2008   UNSPECIFIED DISORDER OF KIDNEY AND URETER 11/12/2008   OSTEOARTHRITIS 11/12/2008   PCP:  Deland Pretty, MD Pharmacy:   [id number] , [place] [address] Phone: [phone] Fax: [phone]  [facility] [address] [place] [address] Phone: [phone] Fax: [phone]     Social Determinants of Health (SDOH) Interventions    Readmission Risk Interventions No flowsheet data found.

## 2021-05-17 NOTE — Progress Notes (Signed)
° °  Called by Cardiac Rehab.   Reported increased shortness of breath with exertion.   Will cancel d/c . Place [place] Clip. If elevated will need IV lasix.   Aylin Rhoads NP-C  12:08 PM

## 2021-05-18 ENCOUNTER — Ambulatory Visit: Payer: Medicare Other

## 2021-05-18 LAB — BASIC METABOLIC PANEL
Anion gap: 12 (ref 5–15)
BUN: 71 mg/dL — ABNORMAL HIGH (ref 8–23)
CO2: 21 mmol/L — ABNORMAL LOW (ref 22–32)
Calcium: 8.6 mg/dL — ABNORMAL LOW (ref 8.9–10.3)
Chloride: 102 mmol/L (ref 98–111)
Creatinine, Ser: 2.14 mg/dL — ABNORMAL HIGH (ref 0.44–1.00)
GFR, Estimated: 23 mL/min — ABNORMAL LOW (ref >60)
Glucose, Bld: 231 mg/dL — ABNORMAL HIGH (ref 70–99)
Potassium: 3.9 mmol/L (ref 3.5–5.1)
Sodium: 135 mmol/L (ref 135–145)

## 2021-05-18 LAB — GLUCOSE, CAPILLARY: Glucose-Capillary: 159 mg/dL — ABNORMAL HIGH (ref 70–99)

## 2021-05-18 LAB — MAGNESIUM: Magnesium: 2.2 mg/dL (ref 1.7–2.4)

## 2021-05-18 MED ORDER — PREDNISONE 20 MG PO TABS: 40.0000 mg | ORAL_TABLET | Freq: Every day | ORAL | 0 refills | Status: DC

## 2021-05-18 MED ORDER — EMPAGLIFLOZIN 10 MG PO TABS
10.0000 mg | ORAL_TABLET | Freq: Every day | ORAL | Status: DC
Start: 2021-05-18 — End: 2021-05-18
  Administered 2021-05-18: 10 mg via ORAL
  Filled 2021-05-18: qty 1

## 2021-05-18 MED ORDER — POTASSIUM CHLORIDE CRYS ER 20 MEQ PO TBCR
20.0000 meq | EXTENDED_RELEASE_TABLET | Freq: Once | ORAL | Status: AC
  Administered 2021-05-18: 20 meq via ORAL
  Filled 2021-05-18: qty 1

## 2021-05-18 NOTE — Plan of Care (Signed)

## 2021-05-18 NOTE — Progress Notes (Addendum)
Occupational Therapy Treatment ?Patient Details ?Name: Karen West ?MRN: [id number] ?DOB: [birth year] ?Today's Date: 05/18/2021 ? ? ?History of present illness Helena Sardo is a 81 y.o. female admitted 04/23/21 with STEMI. Coronary/Graft Acute MI Revascularization (04/23/2021); LEFT HEART CATH AND CORONARY ANGIOGRAPHY (04/23/2021); and TEE without cardioversion (05/02/2021) PMH includes CAD s/p DES to RCA in 2021, severe MAC, HTN, HLD, multinodular thyroid s/p thyroidectomy in 2019, anemia, GERD, known RBBB, DM2 (uncontrolled), OSA, carotid artery disease with 50-69% stenosis bilaterally, breast cancer, and lung cancer s/p LVATS 2019 ?  ?OT comments ? Chart reviewed, pt greeted on edge of bed agreeable to tx session. Session targeted re-enforcing energy conservation techniques for safe discharge home. Pt is able to state 2 energy conservation techniques in relation to home set up, required vcs for additional energy conservation techniques. Pt performed functional ambulation in room approx 100 feet with no c/o SOB, demonstrates fair PLB technique. Improved with demonstration and explantation. At completion of session pt with good teach back and understanding of all education provided. Continue to recommend [place]. Pt is left in care of RN, NAD, all needs met.   ? ?Recommendations for follow up therapy are one component of a multi-disciplinary discharge planning process, led by the attending physician.  Recommendations may be updated based on patient status, additional functional criteria and insurance authorization. ?   ?Follow Up Recommendations ? Home health OT  ?  ?Assistance Recommended at Discharge Intermittent Supervision/Assistance  ?Patient can return home with the following ? A little help with walking and/or transfers;A little help with bathing/dressing/bathroom;Assistance with cooking/housework;Assist for transportation;Help with stairs or ramp for entrance ?  ?Equipment Recommendations ? Other (comment) (pt has  appropriate DME)  ?  ?Recommendations for Other Services   ? ?  ?Precautions / Restrictions Precautions ?Precautions: Fall ?Restrictions ?Weight Bearing Restrictions: No  ? ? ?  ? ?Mobility Bed Mobility ?  ?  ?  ?  ?  ?  ?  ?  ?  ? ?Transfers ?  ?  ?  ?  ?  ?  ?  ?  ?  ?  ?  ?  ?Balance   ?  ?  ?  ?  ?  ?  ?  ?  ?  ?  ?  ?  ?  ?  ?  ?  ?  ?  ?   ? ?ADL either performed or assessed with clinical judgement  ? ?ADL   ?  ?  ?  ?  ?  ?  ?  ?  ?  ?  ?  ?  ?  ?  ?  ?  ?  ?  ?Functional mobility during ADLs: Supervision/safety ?General ADL Comments: Session focus on energy conservation ?  ? ?Extremity/Trunk Assessment   ?  ?  ?  ?  ?  ? ?Vision   ?  ?  ?Perception   ?  ?Praxis   ?  ? ?Cognition Arousal/Alertness: Awake/alert ?Behavior During Therapy: [facility] for tasks assessed/performed ?Overall Cognitive Status: Within Functional Limits for tasks assessed ?  ?  ?  ?  ?  ?  ?  ?  ?  ?  ?  ?  ?  ?  ?  ?  ?  ?  ?  ?   ?Exercises   ? ?  ?Shoulder Instructions   ? ? ?  ?General Comments    ? ? ?Pertinent Vitals/ Pain       Pain Assessment ?Pain  Assessment: No/denies pain ? ?Home Living   ?  ?  ?  ?  ?  ?  ?  ?  ?  ?  ?  ?  ?  ?  ?  ?  ?  ?  ? ?  ?Prior Functioning/Environment    ?  ?  ?  ?   ? ?Frequency ? Min 2X/week  ? ? ? ? ?  ?Progress Toward Goals ? ?OT Goals(current goals can now be found in the care plan section) ? Progress towards OT goals: Progressing toward goals ? ?Acute Rehab OT Goals ?Patient Stated Goal: go home ?OT Goal Formulation: With patient ?Time For Goal Achievement: 06/01/21 ?Potential to Achieve Goals: Good  ?Plan Discharge plan remains appropriate   ? ?Co-evaluation ? ? ?   ?  ?  ?  ?  ? ?  ?AM-PAC OT "6 Clicks" Daily Activity     ?Outcome Measure ? ? Help from another person eating meals?: None ?Help from another person taking care of personal grooming?: None ?Help from another person toileting, which includes using toliet, bedpan, or urinal?: A Little ?Help from another person bathing (including washing,  rinsing, drying)?: A Little ?Help from another person to put on and taking off regular upper body clothing?: None ?Help from another person to put on and taking off regular lower body clothing?: A Little ?6 Click Score: 21 ? ?  ?End of Session   ? ?OT Visit Diagnosis: Other abnormalities of gait and mobility (R26.89);Muscle weakness (generalized) (M62.81) ?  ?Activity Tolerance Patient tolerated treatment well ?  ?Patient Left in bed;with call bell/phone within reach;with nursing/sitter in room ?  ?Nurse Communication Mobility status ?  ? ?   ? ?Time: 6468-0321 ?OT Time Calculation (min): 10 min ? ?Charges: OT General Charges ?$OT Visit: 1 Visit ?OT Treatments ?$Self Care/Home Management : 8-22 mins ? ?Shanon Payor, OTD OTR/L  ?05/18/21, 11:56 AM  ?

## 2021-05-18 NOTE — Progress Notes (Addendum)
Progress Note  Patient Name: Karen West Date of Encounter: 05/18/2021  [facility] Cardiologist: Minus Breeding, MD   Subjective   TEE 2/27: EF 35-40% severe MR. Reviewed with Structural Team. Not candidate for MV Clip due to perivalvular calcium and short posterior leaflet.  2/28: increased SOB w/ walking. ReDs Clip 37%. Given dose of 60 mg IV Lasix   1.8L in UOP yesterday, net negative 1.1L. Wt unchanged. SCr down, 2.35>>2.29>>2.14 but BUN slightly higher, 68>>71   Note, RHC done on 2/24 showed RAP 3. PCWP was 22 but severe MR (v waves to 40). Wt was 145 lb at time of study (143 lb today).    She slept well last night w/ HOB slightly elevated at 15 degrees. No orthopnea/PND. Denies any current resting dyspnea. Has not ambulated yet today. No chest pain.   Inpatient Medications    Scheduled Meds:  aspirin EC  81 mg Oral Daily   enoxaparin (LOVENOX) injection  30 mg Subcutaneous Q24H   ezetimibe  10 mg Oral Daily   feeding supplement (GLUCERNA SHAKE)  237 mL Oral TID BM   ferrous sulfate  325 mg Oral BID WC   hydrALAZINE  37.5 mg Oral Q8H   insulin aspart  0-15 Units Subcutaneous TID WC   isosorbide mononitrate  60 mg Oral Daily   levothyroxine  88 mcg Oral Q0600   metoprolol succinate  12.5 mg Oral QHS   pantoprazole  40 mg Oral Daily   predniSONE  40 mg Oral Q breakfast   ticagrelor  90 mg Oral BID   Continuous Infusions:   PRN Meds: acetaminophen, diazepam, labetalol, nitroGLYCERIN   Vital Signs    Vitals:   05/17/21 1309 05/17/21 2039 05/17/21 2044 05/18/21 0515  BP: 138/65 136/77 136/77 112/63  Pulse:   85 89  Resp:   16   Temp:   97.8 F (36.6 C) 97.6 F (36.4 C)  TempSrc:   Oral Oral  SpO2:   99% 98%  Weight:    65.2 kg  Height:        Intake/Output Summary (Last 24 hours) at 05/18/2021 0750 Last data filed at 05/18/2021 0521 Gross per 24 hour  Intake 720 ml  Output 1800 ml  Net -1080 ml   Last 3 Weights 05/18/2021 05/16/2021 05/15/2021   Weight (lbs) 143 lb 11.2 oz 143 lb 4.8 oz 143 lb 4.8 oz  Weight (kg) 65.182 kg 65 kg 65 kg      Telemetry   Sinus 80-90s, occasional PVCs  Personally reviewed  Physical Exam   General:  Well appearing. No resp difficulty HEENT: normal Neck: supple. JVD 7-8 cm. Carotids 2+ bilat; no bruits. No lymphadenopathy or thryomegaly appreciated. Cor: PMI nondisplaced. Regular rate & rhythm. 3/6 MR at apex  Lungs: CTAB  Abdomen: soft, nontender, nondistended. No hepatosplenomegaly. No bruits or masses. Good bowel sounds. Extremities: no cyanosis, clubbing, rash, edema Neuro: alert & orientedx3, cranial nerves grossly intact. moves all 4 extremities w/o difficulty. Affect pleasant   Labs    High Sensitivity Troponin:   Recent Labs  Lab 04/23/21 1000 04/23/21 1222  TROPONINIHS 22,485* >24,000*     Chemistry Recent Labs  Lab 05/16/21 0338 05/17/21 0222 05/18/21 0246  NA 137 137 135  K 3.4* 4.0 3.9  CL 100 102 102  CO2 25 23 21*  GLUCOSE 140* 129* 231*  BUN 70* 68* 71*  CREATININE 2.35* 2.29* 2.14*  CALCIUM 8.5* 8.6* 8.6*  MG 1.9 2.5* 2.2  GFRNONAA  21* 21* 23*  ANIONGAP 12 12 12     Lipids No results for input(s): CHOL, TRIG, HDL, LABVLDL, LDLCALC, CHOLHDL in the last 168 hours.  Hematology Recent Labs  Lab 05/13/21 0750 05/17/21 0222  WBC  --  4.6  RBC  --  3.27*  HGB 9.5*   10.2* 9.2*  HCT 28.0*   30.0* 28.6*  MCV  --  87.5  MCH  --  28.1  MCHC  --  32.2  RDW  --  21.7*  PLT  --  289   Thyroid No results for input(s): TSH, FREET4 in the last 168 hours.  BNPNo results for input(s): BNP, PROBNP in the last 168 hours.  DDimer No results for input(s): DDIMER in the last 168 hours.   Radiology    ECHO TEE  Result Date: 05/16/2021    TRANSESOPHOGEAL ECHO REPORT   Patient Name:   Karen West Date of Exam: 05/16/2021 Medical Rec #:  [id number]        Height:       61.0 in Accession #:    [id number]       Weight:       143.3 lb Date of Birth:  [birth year]          BSA:          1.639 m Patient Age:    81 years         BP:           132/80 mmHg Patient Gender: F                HR:           88 bpm. Exam Location:  Inpatient Procedure: Transesophageal Echo, 3D Echo, Color Doppler and Cardiac Doppler Indications:     I34.0 Nonrheumatic mitral (valve) insufficiency  History:         Patient has prior history of Echocardiogram examinations, most                  recent 05/09/2021. Risk Factors:Hypertension, Diabetes,                  Dyslipidemia and Sleep Apnea.  Sonographer:     Raquel Sarna Senior RDCS Referring Phys:  [id number] [facility] A Gasper Sells Diagnosing Phys: Rudean Haskell MD PROCEDURE: After discussion of the risks and benefits of a TEE, an informed consent was obtained from the patient. The transesophogeal probe was passed without difficulty through the esophogus of the patient. Sedation performed by different physician. The patient was monitored while under deep sedation. Anesthestetic sedation was provided intravenously by Anesthesiology: 188mg  of Propofol. The patient developed no complications during the procedure. IMPRESSIONS  1. Left ventricular ejection fraction, by estimation, is 35 to 40%. Left ventricular ejection fraction by 3D volume is 35 %. The left ventricle has moderately decreased function. Anterolateral wall motion abdnormalities.  2. The mitral valve is degenerative. Severe mitral valve regurgitation there is a functional componet after LCX infarct associated WMA and calcified posterior leaflet. There is pulmonary vein systolic flow reversal of the left sized veins. 3D Vena contracta area 0.42 cm2. Mean gradient of 2 mm Hg. There posterior leaflet length of 1.15 cm as best and the MVA by 3D planimetry of 3.83 cm2 are poor prognostic signs for TEER interventions.  3. Right ventricular systolic function is normal. The right ventricular size is normal.  4. Left atrial size was dilated. No left atrial/left atrial appendage thrombus was detected.  5. A  small  pericardial effusion is present. The pericardial effusion is anterior to the right ventricle.  6. The aortic valve is calcified. Aortic valve regurgitation is mild. Moderate aortic valve stenosis.  7. There is Moderate (Grade III) plaque involving the descending aorta. Comparison(s): Mitral regurgitation appears worse from prior study. FINDINGS  Left Ventricle: Left ventricular ejection fraction, by estimation, is 35 to 40%. Left ventricular ejection fraction by 3D volume is 35 %. The left ventricle has moderately decreased function. The left ventricular internal cavity size was normal in size.  LV Wall Scoring: The entire lateral wall is hypokinetic. Right Ventricle: The right ventricular size is normal. No increase in right ventricular wall thickness. Right ventricular systolic function is normal. Left Atrium: Left atrial size was dilated. No left atrial/left atrial appendage thrombus was detected. Right Atrium: Right atrial size was normal in size. Prominent Chiari network. Pericardium: A small pericardial effusion is present. The pericardial effusion is anterior to the right ventricle. Mitral Valve: The mitral valve is degenerative in appearance. Severe mitral valve regurgitation. MV peak gradient, 5.5 mmHg. The mean mitral valve gradient is 2.0 mmHg. Tricuspid Valve: The tricuspid valve is normal in structure. Tricuspid valve regurgitation is trivial. No evidence of tricuspid stenosis. Aortic Valve: The aortic valve is calcified. Aortic valve regurgitation is mild. Moderate aortic stenosis is present. Pulmonic Valve: The pulmonic valve was grossly normal. Pulmonic valve regurgitation is not visualized. Aorta: The aortic root and ascending aorta are structurally normal, with no evidence of dilitation. There is moderate (Grade III) plaque involving the descending aorta. IAS/Shunts: No atrial level shunt detected by color flow Doppler.  LEFT VENTRICLE PLAX 2D LVOT diam:     1.80 cm LV SV:         36 LV SV  Index:   22              3D Volume EF LVOT Area:     2.54 cm        LV 3D EF:    Left                                             ventricul                                             ar                                             ejection                                             fraction                                             by 3D  volume is                                             35 %.                                LV 3D EDV:   92.87 ml                                LV 3D ESV:   59.96 ml                                 3D Volume EF:                                3D EF:        35 % AORTIC VALVE LVOT Vmax:   83.60 cm/s LVOT Vmean:  55.100 cm/s LVOT VTI:    0.141 m MITRAL VALVE MV Area VTI:  1.36 cm        SHUNTS MV Peak grad: 5.5 mmHg        Systemic VTI:  0.14 m MV Mean grad: 2.0 mmHg        Systemic Diam: 1.80 cm MV Vmax:      1.17 m/s MV Vmean:     73.0 cm/s MR Peak grad:    130.0 mmHg MR Mean grad:    81.0 mmHg MR Vmax:         570.00 cm/s MR Vmean:        419.0 cm/s MR PISA:         3.08 cm MR PISA Eff ROA: 21 mm MR PISA Radius:  0.70 cm Rudean Haskell MD Electronically signed by Rudean Haskell MD Signature Date/Time: 05/16/2021/2:30:51 PM    Final     Cardiac Studies   LHC 04/23/2021    Prox LAD to Mid LAD lesion is 70% stenosed.   Prox LAD lesion is 50% stenosed.   Mid Cx lesion is 100% stenosed.   Dist Cx lesion is 80% stenosed.   3rd Mrg lesion is 100% stenosed.   Non-stenotic Dist RCA lesion was previously treated.   Non-stenotic Mid RCA to Dist RCA lesion was previously treated.   A drug-eluting stent was successfully placed.   Post intervention, there is a 0% residual stenosis.   Post intervention, there is a 0% residual stenosis.   There is moderate left ventricular systolic dysfunction. Acute inferolateral wall ST segment elevation secondary to total occlusion of a large left circumflex coronary artery with TIMI 0  flow.   Calcification involving the proximal LAD with 50 and 70% stenoses.   Widely patent dominant RCA with previously placed 4 tandem stents proximally to distally without restenosis.   Moderate acute LV dysfunction with EF estimated at 35 to 40% with hypocontractility involving the mid distal anterolateral wall.  LVEDP 30 mm   Successful percutaneous coronary intervention to the left circumflex coronary artery with ultimate insertion of a 2.75 x 26 mm Medtronic Onyx frontiers stent postdilated to 3.04 mm with the 100% occlusion being reduced to 0% and brisk TIMI-3 flow in the major vessel.  There is residual thrombus and a small marginal branch arising from the stented segment which was totally occluded initially.  There also was faint thrombus in a very distal branch of the circumflex.  The plan is to continue Aggrastat for 18 hours post procedure.   RECOMMENDATION: DAPT indefinitely.  We will continue Aggrastat 18 hours postinfusion.  Optimize blood pressure.  Plan 2D echo Doppler study.  Continue carvedilol, consider ARB and transition to [facility] if LV function remains impaired.  Continue Praluent/Zetia with statin intolerance and probable familial hyperlipidemia.     TTE 04/24/2021   1. Septal , apical, lateral and posterior lateral hypokinesis . Left  ventricular ejection fraction, by estimation, is 30 to 35%. The left  ventricle has moderately decreased function. The left ventricle  demonstrates regional wall motion abnormalities  (see scoring diagram/findings for description). The left ventricular  internal cavity size was moderately dilated. There is mild left  ventricular hypertrophy. Left ventricular diastolic parameters are  indeterminate.   2. Right ventricular systolic function is normal. The right ventricular  size is normal. There is moderately elevated pulmonary artery systolic  pressure.   3. Left atrial size was mildly dilated.   4. The mitral valve is abnormal.  Moderate to severe mitral valve  regurgitation. No evidence of mitral stenosis. Severe mitral annular  calcification.   5. Tricuspid valve regurgitation is moderate.   6. The aortic valve is normal in structure. There is moderate  calcification of the aortic valve. There is moderate thickening of the  aortic valve. Aortic valve regurgitation is mild to moderate. Mild aortic  valve stenosis.   7. The inferior vena cava is normal in size with greater than 50%  respiratory variability, suggesting right atrial pressure of 3 mmHg.   Patient Profile     81 y.o. female with CAD status post PCI to the RCA, hypertension, CKD stage IV, anemia, diabetes, sleep apnea, carotid artery disease who was admitted 04/23/2021 for inferolateral STEMI.  Course has been complicated by severe mitral valve regurgitation, AKI, systolic heart failure and anemia.  Assessment & Plan   1. Acute on chronic systolic heart failure:  - due to iCM EF 30-35% and severe MR - TEE 35-40% severe MR - RHC 2/24 showed normal cardiac output  - volume status is ok, suspect severe MR likely contributing to exertional dyspnea and slightly elevated ReDs reading  - Add Jardiance 10 mg (no longer able to take metformin with low GFR, 23) - Lasix 40 mg PRN  - Continue Toprol 12.5mg  daily - No ACE/ARB/ARNI/MAR with AKI for now with AKI.  - Continue bidil 1 tid - Will follow in HF Clinic  2. CAD with Inferolateral ST elevation myocardial infarction:  Late presentation STEMI that has compounded her chronic moderate mitral regurgitation.  - No s/s angina - Continue DAPT, statin and b-blocker - ambulate w/ CR again today   3.  Severe mitral regurgitation:  - TEE 2/27 reviewed EF 35-40% severe MR. - Reviewed with Structural Team. Not candidate for MV Clip due to perivalvular calcium and short posterior leaflet.  Will f/u with Dr Ali Lowe with possible referral to [place] for percutaneous MVR  4.  AKI on CKD 4  - baseline Scr ~1.3-1.5 -  Scr up slightly 2.15>2.35>>2.29>>2.14 - SBP stable - Cardiac output ok on RHC. Suspect ATN/contrast - continue supportive care.  - Start SGLT2i (Jardiance, GRF 23)   5. Acute Gout Flare - Toe pain - Uric acid elevated, 14.9  - tx w/  prednisone burst x 3 days   Plan home today with close f/u in HF and Structural Clinics.   Lyda Jester, PA-C  7:50 AM  Patient seen and examined with the above-signed Advanced Practice Provider and/or Housestaff. I personally reviewed laboratory data, imaging studies and relevant notes. I independently examined the patient and formulated the important aspects of the plan. I have edited the note to reflect any of my changes or salient points. I have personally discussed the plan with the patient and/or family.  D/c held yesterday due to exertional dyspnea. Feels better today. Weight stable> ReDS only mildly elevated at 37%   Denies orthopnea or PND. SCr improving slowly  General:  Well appearing. No resp difficulty HEENT: normal Neck: supple. no JVD. Carotids 2+ bilat; no bruits. No lymphadenopathy or thryomegaly appreciated. Cor: PMI nondisplaced. Regular rate & rhythm. Soft MR Lungs: clear Abdomen: soft, nontender, nondistended. No hepatosplenomegaly. No bruits or masses. Good bowel sounds. Extremities: no cyanosis, clubbing, rash, edema Neuro: alert & orientedx3, cranial nerves grossly intact. moves all 4 extremities w/o difficulty. Affect pleasant  Ok for d/c home today with close f/u in HF and structural clinics.   Total time spent 35 minutes. Over half that time spent discussing above.   Glori Bickers, MD  12:15 PM

## 2021-05-18 NOTE — Progress Notes (Signed)
Pt sts she walked in room with OT 6 laps, which would be approximately 240 ft. Sts she has less SOB and did not need to rest. Discussed with pt signs/symptoms of fluid, daily wts, asking for help from her family. Voiced understanding. Referring to [place] however she might need to wait for valve repair. ?[id number] ?Yves Dill CES, ACSM ?11:55 AM ?05/18/2021 ? ?

## 2021-05-18 NOTE — Discharge Instructions (Signed)
Information about your medication: Brilinta (anti-platelet agent) ? ?Generic Name (Brand): ticagrelor (Brilinta), twice daily medication ? ?PURPOSE: You are taking this medication along with aspirin to lower your chance of having a heart attack, stroke, or blood clots in your heart stent. These can be fatal. Brilinta and aspirin help prevent platelets from sticking together and forming a clot that can block an artery or your stent.  ? ?Common SIDE EFFECTS you may experience include: bruising or bleeding more easily, shortness of breath ? ?Do not stop taking BRILINTA without talking to the doctor who prescribes it for you. People who are treated with a stent and stop taking Brilinta too soon, have a higher risk of getting a blood clot in the stent, having a heart attack, or dying. If you stop Brilinta because of bleeding, or for other reasons, your risk of a heart attack or stroke may increase.  ? ?Avoid taking NSAID agents or anti-inflammatory medications such as ibuprofen, naproxen given increased bleed risk with plavix - can use acetaminophen (Tylenol) if needed for pain. ? ?Tell all of your doctors and dentists that you are taking Brilinta. They should talk to the doctor who prescribed Brilinta for you before you have any surgery or invasive procedure.  ? ?Contact your health care provider if you experience: severe or uncontrollable bleeding, pink/red/brown urine, vomiting blood or vomit that looks like "coffee grounds", red or black stools (looks like tar), coughing up blood or blood clots ?----------------------------------------------------------------------------------------------------------------------  ?

## 2021-05-19 ENCOUNTER — Encounter: Payer: Self-pay | Admitting: Cardiology

## 2021-05-19 DIAGNOSIS — E1122 Type 2 diabetes mellitus with diabetic chronic kidney disease: Secondary | ICD-10-CM | POA: Diagnosis not present

## 2021-05-19 DIAGNOSIS — Z7982 Long term (current) use of aspirin: Secondary | ICD-10-CM | POA: Diagnosis not present

## 2021-05-19 DIAGNOSIS — G4730 Sleep apnea, unspecified: Secondary | ICD-10-CM | POA: Diagnosis not present

## 2021-05-19 DIAGNOSIS — I130 Hypertensive heart and chronic kidney disease with heart failure and stage 1 through stage 4 chronic kidney disease, or unspecified chronic kidney disease: Secondary | ICD-10-CM | POA: Diagnosis not present

## 2021-05-19 DIAGNOSIS — I2121 ST elevation (STEMI) myocardial infarction involving left circumflex coronary artery: Secondary | ICD-10-CM | POA: Diagnosis not present

## 2021-05-19 DIAGNOSIS — I5023 Acute on chronic systolic (congestive) heart failure: Secondary | ICD-10-CM | POA: Diagnosis not present

## 2021-05-19 DIAGNOSIS — N179 Acute kidney failure, unspecified: Secondary | ICD-10-CM | POA: Diagnosis not present

## 2021-05-19 DIAGNOSIS — D649 Anemia, unspecified: Secondary | ICD-10-CM | POA: Diagnosis not present

## 2021-05-19 DIAGNOSIS — M79676 Pain in unspecified toe(s): Secondary | ICD-10-CM | POA: Diagnosis not present

## 2021-05-19 DIAGNOSIS — Z7901 Long term (current) use of anticoagulants: Secondary | ICD-10-CM | POA: Diagnosis not present

## 2021-05-19 DIAGNOSIS — I340 Nonrheumatic mitral (valve) insufficiency: Secondary | ICD-10-CM | POA: Diagnosis not present

## 2021-05-19 DIAGNOSIS — N184 Chronic kidney disease, stage 4 (severe): Secondary | ICD-10-CM | POA: Diagnosis not present

## 2021-05-19 DIAGNOSIS — Z7984 Long term (current) use of oral hypoglycemic drugs: Secondary | ICD-10-CM | POA: Diagnosis not present

## 2021-05-19 MED ORDER — TICAGRELOR 90 MG PO TABS: 90.0000 mg | ORAL_TABLET | Freq: Two times a day (BID) | ORAL | 3 refills | Status: DC

## 2021-05-20 NOTE — Progress Notes (Signed)
Patient ID: Karen West MRN: [id number] DOB/AGE: 1941-03-06 81 y.o.  Primary Care Physician:Pharr, Thayer Jew, MD Primary Cardiologist: Minus Breeding, MD   FOCUSED CARDIOVASCULAR PROBLEM LIST:   1.  Severe mitral regurgitation (mixed etiology II/IIIB) 2.  Coronary artery disease status post PCI of right coronary artery and most recently late presentation lateral ST elevation myocardial infarction treated with 1 drug-eluting stent 2/23 3.  Ischemic cardiomyopathy with ejection fraction of 30 to 35% 4.  Chronic kidney disease 5.  Type 2 diabetes 6.  Hypertension 7.  Hyperlipidemia   HISTORY OF PRESENT ILLNESS: The patient is a 81 y.o. female with the indicated medical history here for recommendations regarding her severe mitral regurgitation.  The patient was admitted to [facility] last month.  She presented after developing chest pain for about 18 hours.  She was found to have a lateral ST elevation myocardial infarction and underwent stenting of the left circumflex.  Multiple echocardiograms demonstrated a new cardiomyopathy with inferolateral akinesis.  She underwent a TEE which demonstrated severe mitral regurgitation.  There is a heavily calcified posterior leaflet as well as mitral annular calcification seen.  Her imaging was reviewed and she was not thought to be a palpable candidate for transcatheter mitral edge-to-edge repair.  She was ultimately optimized from medical standpoint and discharged.  Today she reports that she has had some orthopnea and exertional dyspnea which is a little worse than when she was in the hospital.  She denies any peripheral edema.  Her weight has been relatively stable however.  She denies any exertional angina.  She has had no presyncope or syncope.  She is required no hospitalizations or emergency room visits since discharge last week.  She has been completely compliant with her medications.  She reports that she has good dental health, sees  a dentist every 6 months, and has upper dentures and partial lower dentures without need for dental extractions.  Past Medical History:  Diagnosis Date   Atherosclerosis of aorta ([place])    a. 01/2017/03/2017 - noted on high res chest CTs.   Breast cancer ([place])    a. Bilateral --> s/p left mastectomy   Carotid artery disease ([place])    a. 06/2681 w/ 41-96% LICA stenosis and <22% RICA stenosis; b. 10/2015 Carotid U/S: < 50% BICA stenosis   Chest pain    a. 09/2011 MV: EF 68%, no ischemia/infarct.   Chronic anemia    Chronic headaches    denies   Coronary artery calcification seen on CT scan    a. 01/2017 High res CT: atherosclerotic calcification of the arterial vascularture, including severe involvement of the coronary arteries; b. 03/2017 CT Chest: coronary and Ao atheroscelrosis.   GERD (gastroesophageal reflux disease)    History of echocardiogram    a. 09/2011 Echo: EF 55-60%, no rwma, triv AI, PASP 58mmHg.   Hyperlipidemia    Hypertension    Hyperthyroidism    Left upper lobe pulmonary nodule    a. 02/2017 PET: slowing enlarging 1.7cm LUL nodule w/ low-grade metabolic activity; b. 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 05/2017 s/p VATS.   Multinodular goiter    a. 02/2017 PET scan- Hypermetabolic nodule;  b. 11/2117 s/p thyroidectomy   Myocardial infarction ([place]) 06/2019   NONSTEMI   Obesity    OSA on CPAP    cpap   Osteoarthritis    Personal history of radiation therapy 1999   Right bundle branch block    Type II or unspecified type diabetes mellitus  without mention of complication, uncontrolled     Past Surgical History:  Procedure Laterality Date   BREAST BIOPSY  1993; 1995; 2000   left; right; left   BREAST EXCISIONAL BIOPSY Right pt unsure   CARDIAC CATHETERIZATION     CORONARY STENT INTERVENTION N/A 07/03/2019   Procedure: CORONARY STENT INTERVENTION;  Surgeon: Troy Sine, MD;  Location: [place] [facility];  Service: Cardiovascular;  Laterality: N/A;  RCA    CORONARY/GRAFT ACUTE MI REVASCULARIZATION N/A 04/23/2021   Procedure: Coronary/Graft Acute MI Revascularization;  Surgeon: Troy Sine, MD;  Location: [place] [facility];  Service: Cardiovascular;  Laterality: N/A;   ESOPHAGOGASTRODUODENOSCOPY (EGD) WITH PROPOFOL N/A 07/16/2020   Procedure: ESOPHAGOGASTRODUODENOSCOPY (EGD) WITH PROPOFOL;  Surgeon: Carol Ada, MD;  Location: [facility];  Service: Endoscopy;  Laterality: N/A;   FINE NEEDLE ASPIRATION N/A 07/16/2020   Procedure: FINE NEEDLE ASPIRATION (FNA) LINEAR;  Surgeon: Carol Ada, MD;  Location: [facility];  Service: Endoscopy;  Laterality: N/A;   LEFT HEART CATH AND CORONARY ANGIOGRAPHY N/A 07/03/2019   Procedure: LEFT HEART CATH AND CORONARY ANGIOGRAPHY;  Surgeon: Troy Sine, MD;  Location: [place] [facility];  Service: Cardiovascular;  Laterality: N/A;   LEFT HEART CATH AND CORONARY ANGIOGRAPHY N/A 04/23/2021   Procedure: LEFT HEART CATH AND CORONARY ANGIOGRAPHY;  Surgeon: Troy Sine, MD;  Location: [place] [facility];  Service: Cardiovascular;  Laterality: N/A;   MASTECTOMY Left 2000   left   RIGHT HEART CATH N/A 05/13/2021   Procedure: RIGHT HEART CATH;  Surgeon: Jolaine Artist, MD;  Location: [place] [facility];  Service: Cardiovascular;  Laterality: N/A;   TEE WITHOUT CARDIOVERSION N/A 05/02/2021   Procedure: TRANSESOPHAGEAL ECHOCARDIOGRAM (TEE);  Surgeon: Skeet Latch, MD;  Location: [place];  Service: Cardiovascular;  Laterality: N/A;   TEE WITHOUT CARDIOVERSION N/A 05/16/2021   Procedure: TRANSESOPHAGEAL ECHOCARDIOGRAM (TEE);  Surgeon: Werner Lean, MD;  Location: [facility];  Service: Cardiovascular;  Laterality: N/A;   THYROIDECTOMY  05/10/2017   VIDEO BRONCHOSCOPY WITH ENDOBRONCHIAL NAVIGATION (N/A)   THYROIDECTOMY N/A 05/10/2017   Procedure: TOTAL THYROIDECTOMY;  Surgeon: Armandina Gemma, MD;  Location: [place];  Service: General;  Laterality: N/A;   UPPER ESOPHAGEAL ENDOSCOPIC ULTRASOUND  (EUS) N/A 07/16/2020   Procedure: UPPER ESOPHAGEAL ENDOSCOPIC ULTRASOUND (EUS);  Surgeon: Carol Ada, MD;  Location: [facility];  Service: Endoscopy;  Laterality: N/A;   VIDEO ASSISTED THORACOSCOPY (VATS)/ LOBECTOMY Left 06/04/2017   Procedure: VIDEO ASSISTED THORACOSCOPY (VATS)/LEFT UPPER LOBECTOMY;  Surgeon: [name], MD;  Location: [facility];  Service: Thoracic;  Laterality: Left;   VIDEO BRONCHOSCOPY WITH ENDOBRONCHIAL NAVIGATION N/A 05/10/2017   Procedure: VIDEO BRONCHOSCOPY WITH ENDOBRONCHIAL NAVIGATION;  Surgeon: [name], MD;  Location: [facility];  Service: Thoracic;  Laterality: N/A;   VIDEO BRONCHOSCOPY WITH ENDOBRONCHIAL ULTRASOUND N/A 06/04/2017   Procedure: VIDEO BRONCHOSCOPY WITH ENDOBRONCHIAL ULTRASOUND;  Surgeon: [name], MD;  Location: [facility];  Service: Thoracic;  Laterality: N/A;    Family History  Problem Relation Age of Onset   Diabetes Brother        x3   Hypertension Brother        x3   Diabetes Brother    Stroke Brother    Heart attack Mother 15       Mother Died of MI age 90   Diabetes Sister    Stroke Sister     Social History   Socioeconomic History   Marital status: Widowed  Spouse name: Not on file   Number of children: Not on file   Years of education: 16   Highest education level: Bachelor's degree (e.g., BA, AB, BS)  Occupational History   Occupation: retired    Fish farm manager: RETIRED    Comment: accountant  Tobacco Use   Smoking status: Never   Smokeless tobacco: Never  Vaping Use   Vaping Use: Never used  Substance and Sexual Activity   Alcohol use: Yes    Comment: occ   Drug use: No   Sexual activity: Yes    Partners: Male    Birth control/protection: None  Other Topics Concern   Not on file  Social History Narrative   Lives alone.  Four children.    Social Determinants of Health   Financial Resource Strain: Not on file  Food Insecurity: Not on file  Transportation Needs: Not on file  Physical  Activity: Not on file  Stress: Not on file  Social Connections: Not on file  Intimate Partner Violence: Not on file     Prior to Admission medications   Medication Sig Start Date End Date Taking? Authorizing Provider  Alirocumab (PRALUENT) 150 MG/ML SOAJ Inject 150 mg into the skin every 14 (fourteen) days. 02/23/20   Minus Breeding, MD  aspirin EC 81 MG tablet Take 81 mg by mouth daily. Swallow whole.    [provider]  Cholecalciferol (VITAMIN D3) 50 MCG (2000 UT) TABS Take 2,000 Units by mouth daily.    [provider]  empagliflozin (JARDIANCE) 10 MG TABS tablet Take 1 tablet (10 mg total) by mouth daily before breakfast. 05/17/21   Clegg, Amy D, NP  ezetimibe (ZETIA) 10 MG tablet Take 10 mg by mouth every evening.    [provider]  ferrous sulfate 325 (65 FE) MG tablet Take 325 mg by mouth 2 (two) times daily. 09/27/20   [provider]  hydrALAZINE (APRESOLINE) 25 MG tablet Take 1.5 tablets (37.5 mg total) by mouth every 8 (eight) hours. 05/17/21   Clegg, Amy D, NP  hydrocortisone cream 1 % Apply 1 application topically 2 (two) times daily as needed for itching.     [provider]  isosorbide mononitrate (IMDUR) 60 MG 24 hr tablet Take 1 tablet (60 mg total) by mouth daily. 05/18/21   Clegg, Amy D, NP  levothyroxine (SYNTHROID, LEVOTHROID) 88 MCG tablet Take 88 mcg by mouth daily before breakfast.  07/02/17   [provider]  metoprolol succinate (TOPROL-XL) 25 MG 24 hr tablet Take 0.5 tablets (12.5 mg total) by mouth at bedtime. 05/17/21   Clegg, Amy D, NP  nitroGLYCERIN (NITROSTAT) 0.4 MG SL tablet Place 1 tablet (0.4 mg total) under the tongue every 5 (five) minutes as needed for chest pain. 10/28/19 04/24/22  Deberah Pelton, NP  Omega-3 Fatty Acids (FISH OIL) 1200 MG CAPS Take 1,200 mg by mouth 2 (two) times daily.     [provider]  omeprazole (PRILOSEC) 20 MG capsule Take 20 mg by mouth 2 (two) times daily.     [provider]  predniSONE (DELTASONE) 20 MG tablet Take 2 tablets (40 mg total) by mouth daily with breakfast. 05/19/21   Clegg, Amy D, NP  ticagrelor (BRILINTA) 90 MG TABS tablet Take 1 tablet (90 mg total) by mouth 2 (two) times daily. 05/19/21   Minus Breeding, MD  vitamin B-12 (CYANOCOBALAMIN) 1000 MCG tablet Take 1,000 mcg by mouth daily.    [provider]    Allergies  Allergen Reactions   Tramadol Nausea And Vomiting and Other (See Comments)    "got sick to my stomach and was throwing up blood; it put me in the hospital")   Lisinopril Rash and Other (See Comments)    Renal failure    Rosuvastatin Other (See Comments)    Other reaction(s): rhabdo   Tapazole [Methimazole] Itching and Rash    REVIEW OF SYSTEMS:  General: no fevers/chills/night sweats Eyes: no blurry vision, diplopia, or amaurosis ENT: no sore throat or hearing loss Resp: no cough, wheezing, or hemoptysis CV: no edema or palpitations GI: no abdominal pain, nausea, vomiting, diarrhea, or constipation GU: no dysuria, frequency, or hematuria Skin: no rash Neuro: no headache, numbness, tingling, or weakness of extremities Musculoskeletal: no joint pain or swelling Heme: no bleeding, DVT, or easy bruising Endo: no polydipsia or polyuria  There were no vitals taken for this visit.  PHYSICAL EXAM: GEN:  AO x 3 in no acute distress HEENT: normal, JVD ~8cm Dentition: Normal Neck: JVP normal. +2 carotid upstrokes without bruits. No thyromegaly. Lungs: equal expansion, clear bilaterally CV: RRR with 3/6 holosystolic murmur Abd: soft, non-tender, non-distended; no bruit; positive bowel sounds Ext: no edema, ecchymoses, or cyanosis Vascular: 2+ femoral pulses, 2+ radial pulses       Skin: warm and dry without rash Neuro: CN II-XII grossly intact; motor and sensory grossly intact    DATA AND STUDIES:  EKG: Sinus rhythm with right bundle branch block and lateral infarction pattern  TEE: 2/23  1.  Anterior, inferior, posterolateral, and anterolateral hypokinesis.  Left ventricular ejection fraction, by estimation, is 40 to 45%. The left  ventricle has mildly decreased function. The left ventricle has no  regional wall motion abnormalities.   2. Right ventricular systolic function is normal. The right ventricular  size is normal.   3. No left atrial/left atrial appendage thrombus was detected.   4. A small pericardial effusion is present.   5. Regurgitatnt volume 50 mL. No evidence of pulmonary vein flow  reversal. The mitral valve is normal in structure. Moderate mitral valve  regurgitation. No evidence of mitral stenosis.   6. Restricted motion of the left coronary cusp. The aortic valve is  tricuspid. There is mild calcification of the aortic valve. There is mild  thickening of the aortic valve. Aortic valve regurgitation is mild. No  aortic stenosis is present.   7. There is mild (Grade II) atheroma plaque involving the descending  aorta.   8. The inferior vena cava is normal in size with greater than 50%  respiratory variability, suggesting right atrial pressure of 3 mmHg.  CARDIAC CATH: 2/23 [facility] 04/23/2021     Prox LAD to Mid LAD lesion is 70% stenosed.   Prox LAD lesion is 50% stenosed.   Mid Cx lesion is 100% stenosed.   Dist Cx lesion is 80% stenosed.   3rd Mrg lesion is 100% stenosed.   Non-stenotic Dist RCA lesion was previously treated.   Non-stenotic Mid RCA to Dist RCA lesion was previously treated.   A drug-eluting stent was successfully placed.   Post intervention, there is a 0% residual stenosis.   Post intervention, there is a 0% residual stenosis.   There is moderate left ventricular systolic dysfunction. Acute inferolateral wall ST segment elevation secondary to total occlusion of a large left circumflex coronary artery with TIMI 0 flow.   Calcification involving the proximal LAD with 50 and 70% stenoses.   Widely patent dominant RCA with previously  placed 4 tandem stents proximally to distally without restenosis.  STS RISK CALCULATOR: Pending  NHYA CLASS: 2/3   ASSESSMENT AND PLAN:   Nonrheumatic mitral valve regurgitation  Ischemic cardiomyopathy  Type 2 diabetes mellitus with complication, with long-term current use of insulin (HCC)  Hyperlipidemia associated with type 2 diabetes mellitus (HCC)  Hypertension associated with diabetes ([place])  ST elevation myocardial infarction involving left circumflex coronary artery (HCC)  CKD (chronic kidney disease) stage 4, GFR 15-29 ml/min (HCC)  We had a chance to review all the patient's relevant imaging.  Given the high amounts of calcium associated with her valve including mitral annular calcification and relatively calcified posterior leaflet she is not an optimal candidate for transcatheter mitral edge-to-edge repair.  I was able to review the case with Dr. Jannette Spanner at [facility] who has access to investigational dedicated [place].  I think this is her best option to manage her mitral regurgitation long-term.  I will refer the patient to him for further recommendations.    The patient will see the heart failure section here for follow-up next week.  I will start lasix 40mg  qday and check BMP next week.  Continue Jardiance, hydralazine, Imdur, and Toprol.  Depending on her creatinine and ARB, Entresto, or spironolactone could be considered.  She is well afterload reduced today.  I will see the patient back in 6 weeks.  I have personally reviewed the patients imaging data as summarized above.  I have reviewed the natural history of aortic stenosis with the patient and family members who are present today. We have discussed the limitations of medical therapy and the poor prognosis associated with symptomatic mitral regurgitation. We have also reviewed potential treatment options, including palliative medical therapy, conventional mitral surgery, transcatheter mitral  edge-to-edge repair, and dedicated investigational TMVR platforms. We discussed treatment options in the context of this patient's specific comorbid medical conditions.   All of the patient's questions were answered today. Will make further recommendations based on the results of studies outlined above.   Total time spent with patient today 60 minutes. This includes reviewing records, evaluating the patient and coordinating care.   Early Osmond, MD  05/20/2021 12:33 PM    [facility] [address], [place], [place]  [address] Phone: [phone]; Fax: [phone]

## 2021-05-23 ENCOUNTER — Encounter: Payer: Self-pay | Admitting: Internal Medicine

## 2021-05-23 ENCOUNTER — Telehealth (HOSPITAL_COMMUNITY): Payer: Self-pay

## 2021-05-23 ENCOUNTER — Other Ambulatory Visit: Payer: Self-pay

## 2021-05-23 ENCOUNTER — Institutional Professional Consult (permissible substitution): Payer: Medicare Other | Admitting: Internal Medicine

## 2021-05-23 ENCOUNTER — Ambulatory Visit (INDEPENDENT_AMBULATORY_CARE_PROVIDER_SITE_OTHER): Payer: Medicare Other | Admitting: Internal Medicine

## 2021-05-23 VITALS — BP 118/60 | HR 88 | Ht 61.0 in | Wt 143.4 lb

## 2021-05-23 DIAGNOSIS — Z794 Long term (current) use of insulin: Secondary | ICD-10-CM

## 2021-05-23 DIAGNOSIS — E1122 Type 2 diabetes mellitus with diabetic chronic kidney disease: Secondary | ICD-10-CM | POA: Diagnosis not present

## 2021-05-23 DIAGNOSIS — E118 Type 2 diabetes mellitus with unspecified complications: Secondary | ICD-10-CM

## 2021-05-23 DIAGNOSIS — I255 Ischemic cardiomyopathy: Secondary | ICD-10-CM | POA: Diagnosis not present

## 2021-05-23 DIAGNOSIS — N184 Chronic kidney disease, stage 4 (severe): Secondary | ICD-10-CM

## 2021-05-23 DIAGNOSIS — I130 Hypertensive heart and chronic kidney disease with heart failure and stage 1 through stage 4 chronic kidney disease, or unspecified chronic kidney disease: Secondary | ICD-10-CM | POA: Diagnosis not present

## 2021-05-23 DIAGNOSIS — I152 Hypertension secondary to endocrine disorders: Secondary | ICD-10-CM

## 2021-05-23 DIAGNOSIS — I340 Nonrheumatic mitral (valve) insufficiency: Secondary | ICD-10-CM | POA: Diagnosis not present

## 2021-05-23 DIAGNOSIS — E1169 Type 2 diabetes mellitus with other specified complication: Secondary | ICD-10-CM | POA: Diagnosis not present

## 2021-05-23 DIAGNOSIS — E785 Hyperlipidemia, unspecified: Secondary | ICD-10-CM

## 2021-05-23 DIAGNOSIS — E1159 Type 2 diabetes mellitus with other circulatory complications: Secondary | ICD-10-CM | POA: Diagnosis not present

## 2021-05-23 DIAGNOSIS — I2121 ST elevation (STEMI) myocardial infarction involving left circumflex coronary artery: Secondary | ICD-10-CM

## 2021-05-23 DIAGNOSIS — I5023 Acute on chronic systolic (congestive) heart failure: Secondary | ICD-10-CM | POA: Diagnosis not present

## 2021-05-23 MED ORDER — FUROSEMIDE 40 MG PO TABS: 40.0000 mg | ORAL_TABLET | Freq: Every day | ORAL | 3 refills | Status: DC

## 2021-05-23 NOTE — Telephone Encounter (Signed)
Pt insurance is active and benefits verified through Medicare a/b Co-pay 0, DED $226/$226 met, out of pocket $0/0 met, co-insurance 20%. no pre-authorization required. Passport, 05/23/2021@11 :45am, REF# [id number] ? ?2ndary insurance is active and benefits verified through [employer]. Co-pay 0, DED 0/0 met, out of pocket 0/0 met, co-insurance 0%. No pre-authorization required. ? ?Will contact patient to see if she is interested in the Cardiac Rehab Program. If interested, patient will need to complete follow up appt. Once completed, patient will be contacted for scheduling upon review by the RN Navigator. ?

## 2021-05-23 NOTE — Patient Instructions (Addendum)
Medication Instructions:  ?Your physician has recommended you make the following change in your medication:  ?1.) start furosemide (Lasix) 40 mg - take one table daily ? ?*If you need a refill on your cardiac medications before your next appointment, please call your pharmacy* ? ? ?Lab Work: ?NEXT Tuesday : PLEASE RETURN FOR BLOOD WORK (BMET).  ? ? ?Testing/Procedures: ?NONE ? ? ?Other Instructions ?You have been referred to Dr. Jannette Spanner in [place].  We will work to have you seen there in about 4-6 weeks per Dr. Ali Lowe. ?

## 2021-05-24 DIAGNOSIS — N184 Chronic kidney disease, stage 4 (severe): Secondary | ICD-10-CM | POA: Diagnosis not present

## 2021-05-24 DIAGNOSIS — I340 Nonrheumatic mitral (valve) insufficiency: Secondary | ICD-10-CM | POA: Diagnosis not present

## 2021-05-24 DIAGNOSIS — I2121 ST elevation (STEMI) myocardial infarction involving left circumflex coronary artery: Secondary | ICD-10-CM | POA: Diagnosis not present

## 2021-05-24 DIAGNOSIS — I5023 Acute on chronic systolic (congestive) heart failure: Secondary | ICD-10-CM | POA: Diagnosis not present

## 2021-05-24 DIAGNOSIS — I130 Hypertensive heart and chronic kidney disease with heart failure and stage 1 through stage 4 chronic kidney disease, or unspecified chronic kidney disease: Secondary | ICD-10-CM | POA: Diagnosis not present

## 2021-05-24 DIAGNOSIS — E1122 Type 2 diabetes mellitus with diabetic chronic kidney disease: Secondary | ICD-10-CM | POA: Diagnosis not present

## 2021-05-25 DIAGNOSIS — E1121 Type 2 diabetes mellitus with diabetic nephropathy: Secondary | ICD-10-CM | POA: Diagnosis not present

## 2021-05-25 DIAGNOSIS — N184 Chronic kidney disease, stage 4 (severe): Secondary | ICD-10-CM | POA: Diagnosis not present

## 2021-05-25 DIAGNOSIS — E039 Hypothyroidism, unspecified: Secondary | ICD-10-CM | POA: Diagnosis not present

## 2021-05-25 DIAGNOSIS — E876 Hypokalemia: Secondary | ICD-10-CM | POA: Diagnosis not present

## 2021-05-25 DIAGNOSIS — Z09 Encounter for follow-up examination after completed treatment for conditions other than malignant neoplasm: Secondary | ICD-10-CM | POA: Diagnosis not present

## 2021-05-25 DIAGNOSIS — I5023 Acute on chronic systolic (congestive) heart failure: Secondary | ICD-10-CM | POA: Diagnosis not present

## 2021-05-25 DIAGNOSIS — Z8739 Personal history of other diseases of the musculoskeletal system and connective tissue: Secondary | ICD-10-CM | POA: Diagnosis not present

## 2021-05-25 DIAGNOSIS — I252 Old myocardial infarction: Secondary | ICD-10-CM | POA: Diagnosis not present

## 2021-05-25 DIAGNOSIS — D519 Vitamin B12 deficiency anemia, unspecified: Secondary | ICD-10-CM | POA: Diagnosis not present

## 2021-05-25 DIAGNOSIS — E785 Hyperlipidemia, unspecified: Secondary | ICD-10-CM | POA: Diagnosis not present

## 2021-05-25 DIAGNOSIS — D631 Anemia in chronic kidney disease: Secondary | ICD-10-CM | POA: Diagnosis not present

## 2021-05-25 DIAGNOSIS — I10 Essential (primary) hypertension: Secondary | ICD-10-CM | POA: Diagnosis not present

## 2021-05-26 ENCOUNTER — Telehealth: Payer: Self-pay | Admitting: Internal Medicine

## 2021-05-26 DIAGNOSIS — I130 Hypertensive heart and chronic kidney disease with heart failure and stage 1 through stage 4 chronic kidney disease, or unspecified chronic kidney disease: Secondary | ICD-10-CM | POA: Diagnosis not present

## 2021-05-26 DIAGNOSIS — I340 Nonrheumatic mitral (valve) insufficiency: Secondary | ICD-10-CM | POA: Diagnosis not present

## 2021-05-26 DIAGNOSIS — I5023 Acute on chronic systolic (congestive) heart failure: Secondary | ICD-10-CM | POA: Diagnosis not present

## 2021-05-26 DIAGNOSIS — I2121 ST elevation (STEMI) myocardial infarction involving left circumflex coronary artery: Secondary | ICD-10-CM | POA: Diagnosis not present

## 2021-05-26 DIAGNOSIS — E1122 Type 2 diabetes mellitus with diabetic chronic kidney disease: Secondary | ICD-10-CM | POA: Diagnosis not present

## 2021-05-26 DIAGNOSIS — N184 Chronic kidney disease, stage 4 (severe): Secondary | ICD-10-CM | POA: Diagnosis not present

## 2021-05-26 NOTE — Telephone Encounter (Signed)
Spoke with patient to clarify what she is asking. Patient stated that her son is in prison and prison [facility] [place] wanted the dates the patient was recently in the hospital as proof of her illness so her son can visit her. According to Rodman Key, RN, she routed the primary patient call to medical records. I explained to the patient that she will need to sign a release form before her information could be sent to [facility]. She voiced understanding. ?

## 2021-05-26 NOTE — Telephone Encounter (Signed)
Will route to medical records and primary cardiologist office.  ?

## 2021-05-26 NOTE — Telephone Encounter (Signed)
Wanted to let our office know that someone would be calling to give information on whats going on with her, so that her son would be able come visit her. Please advise  ?

## 2021-05-26 NOTE — Telephone Encounter (Signed)
Patient is calling stating Karen West is requesting a fax stating that the patient was in the hospital and her most recent OV information in order for the patient's son to be able to come and visit the patient. The fax number is [phone]. Please advise.  ?

## 2021-05-27 DIAGNOSIS — E1122 Type 2 diabetes mellitus with diabetic chronic kidney disease: Secondary | ICD-10-CM | POA: Diagnosis not present

## 2021-05-27 DIAGNOSIS — I2121 ST elevation (STEMI) myocardial infarction involving left circumflex coronary artery: Secondary | ICD-10-CM | POA: Diagnosis not present

## 2021-05-27 DIAGNOSIS — I5023 Acute on chronic systolic (congestive) heart failure: Secondary | ICD-10-CM | POA: Diagnosis not present

## 2021-05-27 DIAGNOSIS — N184 Chronic kidney disease, stage 4 (severe): Secondary | ICD-10-CM | POA: Diagnosis not present

## 2021-05-27 DIAGNOSIS — I130 Hypertensive heart and chronic kidney disease with heart failure and stage 1 through stage 4 chronic kidney disease, or unspecified chronic kidney disease: Secondary | ICD-10-CM | POA: Diagnosis not present

## 2021-05-27 DIAGNOSIS — I340 Nonrheumatic mitral (valve) insufficiency: Secondary | ICD-10-CM | POA: Diagnosis not present

## 2021-05-30 DIAGNOSIS — I130 Hypertensive heart and chronic kidney disease with heart failure and stage 1 through stage 4 chronic kidney disease, or unspecified chronic kidney disease: Secondary | ICD-10-CM | POA: Diagnosis not present

## 2021-05-30 DIAGNOSIS — I2121 ST elevation (STEMI) myocardial infarction involving left circumflex coronary artery: Secondary | ICD-10-CM | POA: Diagnosis not present

## 2021-05-30 DIAGNOSIS — E1122 Type 2 diabetes mellitus with diabetic chronic kidney disease: Secondary | ICD-10-CM | POA: Diagnosis not present

## 2021-05-30 DIAGNOSIS — I340 Nonrheumatic mitral (valve) insufficiency: Secondary | ICD-10-CM | POA: Diagnosis not present

## 2021-05-30 DIAGNOSIS — I5023 Acute on chronic systolic (congestive) heart failure: Secondary | ICD-10-CM | POA: Diagnosis not present

## 2021-05-30 DIAGNOSIS — N184 Chronic kidney disease, stage 4 (severe): Secondary | ICD-10-CM | POA: Diagnosis not present

## 2021-05-31 ENCOUNTER — Encounter (HOSPITAL_COMMUNITY): Payer: Medicare Other

## 2021-05-31 ENCOUNTER — Institutional Professional Consult (permissible substitution): Payer: Medicare Other | Admitting: Internal Medicine

## 2021-06-01 ENCOUNTER — Other Ambulatory Visit: Payer: Self-pay | Admitting: *Deleted

## 2021-06-01 DIAGNOSIS — I340 Nonrheumatic mitral (valve) insufficiency: Secondary | ICD-10-CM | POA: Diagnosis not present

## 2021-06-01 DIAGNOSIS — I2121 ST elevation (STEMI) myocardial infarction involving left circumflex coronary artery: Secondary | ICD-10-CM | POA: Diagnosis not present

## 2021-06-01 DIAGNOSIS — E1122 Type 2 diabetes mellitus with diabetic chronic kidney disease: Secondary | ICD-10-CM | POA: Diagnosis not present

## 2021-06-01 DIAGNOSIS — I5023 Acute on chronic systolic (congestive) heart failure: Secondary | ICD-10-CM | POA: Diagnosis not present

## 2021-06-01 DIAGNOSIS — N184 Chronic kidney disease, stage 4 (severe): Secondary | ICD-10-CM | POA: Diagnosis not present

## 2021-06-01 DIAGNOSIS — I130 Hypertensive heart and chronic kidney disease with heart failure and stage 1 through stage 4 chronic kidney disease, or unspecified chronic kidney disease: Secondary | ICD-10-CM | POA: Diagnosis not present

## 2021-06-01 MED ORDER — TICAGRELOR 90 MG PO TABS: 90.0000 mg | ORAL_TABLET | Freq: Two times a day (BID) | ORAL | 3 refills | Status: DC

## 2021-06-02 DIAGNOSIS — I340 Nonrheumatic mitral (valve) insufficiency: Secondary | ICD-10-CM | POA: Diagnosis not present

## 2021-06-02 DIAGNOSIS — I2121 ST elevation (STEMI) myocardial infarction involving left circumflex coronary artery: Secondary | ICD-10-CM | POA: Diagnosis not present

## 2021-06-02 DIAGNOSIS — E1122 Type 2 diabetes mellitus with diabetic chronic kidney disease: Secondary | ICD-10-CM | POA: Diagnosis not present

## 2021-06-02 DIAGNOSIS — N184 Chronic kidney disease, stage 4 (severe): Secondary | ICD-10-CM | POA: Diagnosis not present

## 2021-06-02 DIAGNOSIS — I130 Hypertensive heart and chronic kidney disease with heart failure and stage 1 through stage 4 chronic kidney disease, or unspecified chronic kidney disease: Secondary | ICD-10-CM | POA: Diagnosis not present

## 2021-06-02 DIAGNOSIS — I5023 Acute on chronic systolic (congestive) heart failure: Secondary | ICD-10-CM | POA: Diagnosis not present

## 2021-06-03 DIAGNOSIS — I5023 Acute on chronic systolic (congestive) heart failure: Secondary | ICD-10-CM | POA: Diagnosis not present

## 2021-06-03 DIAGNOSIS — I340 Nonrheumatic mitral (valve) insufficiency: Secondary | ICD-10-CM | POA: Diagnosis not present

## 2021-06-03 DIAGNOSIS — I130 Hypertensive heart and chronic kidney disease with heart failure and stage 1 through stage 4 chronic kidney disease, or unspecified chronic kidney disease: Secondary | ICD-10-CM | POA: Diagnosis not present

## 2021-06-03 DIAGNOSIS — E1122 Type 2 diabetes mellitus with diabetic chronic kidney disease: Secondary | ICD-10-CM | POA: Diagnosis not present

## 2021-06-03 DIAGNOSIS — N184 Chronic kidney disease, stage 4 (severe): Secondary | ICD-10-CM | POA: Diagnosis not present

## 2021-06-03 DIAGNOSIS — I2121 ST elevation (STEMI) myocardial infarction involving left circumflex coronary artery: Secondary | ICD-10-CM | POA: Diagnosis not present

## 2021-06-06 ENCOUNTER — Encounter: Payer: Self-pay | Admitting: Podiatry

## 2021-06-06 ENCOUNTER — Ambulatory Visit (INDEPENDENT_AMBULATORY_CARE_PROVIDER_SITE_OTHER): Payer: Medicare Other | Admitting: Podiatry

## 2021-06-06 ENCOUNTER — Encounter: Payer: Self-pay | Admitting: Internal Medicine

## 2021-06-06 ENCOUNTER — Encounter: Payer: Self-pay | Admitting: Cardiology

## 2021-06-06 ENCOUNTER — Encounter (HOSPITAL_COMMUNITY): Payer: Self-pay

## 2021-06-06 ENCOUNTER — Other Ambulatory Visit: Payer: Self-pay

## 2021-06-06 DIAGNOSIS — E1142 Type 2 diabetes mellitus with diabetic polyneuropathy: Secondary | ICD-10-CM

## 2021-06-06 DIAGNOSIS — E0821 Diabetes mellitus due to underlying condition with diabetic nephropathy: Secondary | ICD-10-CM | POA: Diagnosis not present

## 2021-06-06 DIAGNOSIS — N184 Chronic kidney disease, stage 4 (severe): Secondary | ICD-10-CM | POA: Diagnosis not present

## 2021-06-06 DIAGNOSIS — I130 Hypertensive heart and chronic kidney disease with heart failure and stage 1 through stage 4 chronic kidney disease, or unspecified chronic kidney disease: Secondary | ICD-10-CM | POA: Diagnosis not present

## 2021-06-06 DIAGNOSIS — B351 Tinea unguium: Secondary | ICD-10-CM

## 2021-06-06 DIAGNOSIS — I5023 Acute on chronic systolic (congestive) heart failure: Secondary | ICD-10-CM | POA: Diagnosis not present

## 2021-06-06 DIAGNOSIS — M79675 Pain in left toe(s): Secondary | ICD-10-CM | POA: Diagnosis not present

## 2021-06-06 DIAGNOSIS — M2042 Other hammer toe(s) (acquired), left foot: Secondary | ICD-10-CM

## 2021-06-06 DIAGNOSIS — E1122 Type 2 diabetes mellitus with diabetic chronic kidney disease: Secondary | ICD-10-CM | POA: Diagnosis not present

## 2021-06-06 DIAGNOSIS — M2041 Other hammer toe(s) (acquired), right foot: Secondary | ICD-10-CM

## 2021-06-06 DIAGNOSIS — I2121 ST elevation (STEMI) myocardial infarction involving left circumflex coronary artery: Secondary | ICD-10-CM | POA: Diagnosis not present

## 2021-06-06 DIAGNOSIS — M79674 Pain in right toe(s): Secondary | ICD-10-CM

## 2021-06-06 DIAGNOSIS — I340 Nonrheumatic mitral (valve) insufficiency: Secondary | ICD-10-CM | POA: Diagnosis not present

## 2021-06-06 NOTE — Telephone Encounter (Signed)
Dr. Percival Spanish, ?What results are you talking about that you want me to give to the patient? ?Thanks, ?Lavance Beazer ?

## 2021-06-06 NOTE — Progress Notes (Signed)
This patient returns to my office for at risk foot care.  This patient requires this care by a professional since this patient will be at risk due to having diabetes and kidney disease.This patient is unable to cut nails herself since the patient cannot reach her nails.These nails are painful walking and wearing shoes.  This patient presents for at risk foot care today. ? ?General Appearance  Alert, conversant and in no acute stress. ? ?Vascular  Dorsalis pedis and posterior tibial  pulses are palpable  bilaterally.  Capillary return is within normal limits  bilaterally. Temperature is within normal limits  bilaterally. ? ?Neurologic  Senn-Weinstein monofilament wire test within normal limits  bilaterally. Muscle power within normal limits bilaterally. ? ?Nails Thick disfigured discolored nails with subungual debris  from hallux to fifth toes bilaterally. No evidence of bacterial infection or drainage bilaterally. ? ?Orthopedic  No limitations of motion  feet .  No crepitus or effusions noted.  No bony pathology or digital deformities noted. ? ?Skin  normotropic skin with no porokeratosis noted bilaterally.  No signs of infections or ulcers noted.    ? ?Onychomycosis  Pain in right toes  Pain in left toes ? ?Consent was obtained for treatment procedures.   Mechanical debridement of nails 1-5  bilaterally performed with a nail nipper.  Filed with dremel without incident.  ? ? ?Return office visit    4 months                  Told patient to return for periodic foot care and evaluation due to potential at risk complications. ? ? ?Gardiner Barefoot DPM   ?

## 2021-06-07 DIAGNOSIS — N184 Chronic kidney disease, stage 4 (severe): Secondary | ICD-10-CM | POA: Diagnosis not present

## 2021-06-07 DIAGNOSIS — E1122 Type 2 diabetes mellitus with diabetic chronic kidney disease: Secondary | ICD-10-CM | POA: Diagnosis not present

## 2021-06-07 DIAGNOSIS — I5023 Acute on chronic systolic (congestive) heart failure: Secondary | ICD-10-CM | POA: Diagnosis not present

## 2021-06-07 DIAGNOSIS — I130 Hypertensive heart and chronic kidney disease with heart failure and stage 1 through stage 4 chronic kidney disease, or unspecified chronic kidney disease: Secondary | ICD-10-CM | POA: Diagnosis not present

## 2021-06-07 DIAGNOSIS — I2121 ST elevation (STEMI) myocardial infarction involving left circumflex coronary artery: Secondary | ICD-10-CM | POA: Diagnosis not present

## 2021-06-07 DIAGNOSIS — I340 Nonrheumatic mitral (valve) insufficiency: Secondary | ICD-10-CM | POA: Diagnosis not present

## 2021-06-07 NOTE — Telephone Encounter (Signed)
Patient wanted to make sure the tradgenta wouldn't cause a "heart attack." I let her know that Dr. Percival Spanish approved her to take tradgenta and the K[place]-con.  ?She thanked me for letting her know. ?

## 2021-06-08 DIAGNOSIS — I120 Hypertensive chronic kidney disease with stage 5 chronic kidney disease or end stage renal disease: Secondary | ICD-10-CM | POA: Diagnosis not present

## 2021-06-08 DIAGNOSIS — E1121 Type 2 diabetes mellitus with diabetic nephropathy: Secondary | ICD-10-CM | POA: Diagnosis not present

## 2021-06-08 DIAGNOSIS — D631 Anemia in chronic kidney disease: Secondary | ICD-10-CM | POA: Diagnosis not present

## 2021-06-08 DIAGNOSIS — E039 Hypothyroidism, unspecified: Secondary | ICD-10-CM | POA: Diagnosis not present

## 2021-06-08 DIAGNOSIS — E876 Hypokalemia: Secondary | ICD-10-CM | POA: Diagnosis not present

## 2021-06-08 DIAGNOSIS — I10 Essential (primary) hypertension: Secondary | ICD-10-CM | POA: Diagnosis not present

## 2021-06-08 DIAGNOSIS — Z8739 Personal history of other diseases of the musculoskeletal system and connective tissue: Secondary | ICD-10-CM | POA: Diagnosis not present

## 2021-06-08 DIAGNOSIS — I2121 ST elevation (STEMI) myocardial infarction involving left circumflex coronary artery: Secondary | ICD-10-CM | POA: Diagnosis not present

## 2021-06-08 DIAGNOSIS — I252 Old myocardial infarction: Secondary | ICD-10-CM | POA: Diagnosis not present

## 2021-06-08 DIAGNOSIS — I5023 Acute on chronic systolic (congestive) heart failure: Secondary | ICD-10-CM | POA: Diagnosis not present

## 2021-06-08 DIAGNOSIS — N184 Chronic kidney disease, stage 4 (severe): Secondary | ICD-10-CM | POA: Diagnosis not present

## 2021-06-08 DIAGNOSIS — E785 Hyperlipidemia, unspecified: Secondary | ICD-10-CM | POA: Diagnosis not present

## 2021-06-08 DIAGNOSIS — I340 Nonrheumatic mitral (valve) insufficiency: Secondary | ICD-10-CM | POA: Diagnosis not present

## 2021-06-09 ENCOUNTER — Telehealth: Payer: Self-pay | Admitting: Internal Medicine

## 2021-06-09 DIAGNOSIS — I5022 Chronic systolic (congestive) heart failure: Secondary | ICD-10-CM | POA: Diagnosis not present

## 2021-06-09 DIAGNOSIS — I340 Nonrheumatic mitral (valve) insufficiency: Secondary | ICD-10-CM | POA: Diagnosis not present

## 2021-06-09 MED ORDER — FUROSEMIDE 40 MG PO TABS: 40.0000 mg | ORAL_TABLET | ORAL | 3 refills | Status: DC

## 2021-06-09 NOTE — Telephone Encounter (Signed)
Message from Dr. Ali Lowe: ?Lets go to every other day lasix for now ? ?Called patient and adv her to decrease Lasix 40 mg to one tablet every other day.  She voices understanding. ? ?Called and left VM on Clinical Pharmacist Kelsey's VM of same information. ?

## 2021-06-09 NOTE — Telephone Encounter (Signed)
Message from Dr. Ali Lowe: ?Let's also get a BMP in a week. ? ? ?Pt has follow up at HF clinic on 06/17/21.  Note added to that appointment to obtain BMET at that time. ?

## 2021-06-09 NOTE — Telephone Encounter (Signed)
Karen West from [facility] called saying they would like her furosemide (LASIX) 40 MG tablet, to be adjusted.   They said her sodium was low at 133, potassium 3.9, Scr 2.76.   ?Karen West can be reached at [phone] ext [phone] or [phone].  ?

## 2021-06-09 NOTE — Telephone Encounter (Signed)
I called and spoke with Merleen Nicely, Clinical Pharmacist at [facility].  He saw the patient yesterday for her diabetes and got labs.  He wanted to bring to Dr. Dara Hoyer attention that she ha AKI during recent hospitalization.  Prior to that he said her creatinine was around 1.42.  He drew her labs on 3/8 ([facility]) creatinine at that time was 2.7 and yesterday it was 2.76.  The pt is concerned about her kidney function worsening.   Her last weight w them was 143 on 05/25/21.  Was not c/o SOB or increased swelling.  ? ?I adv that we saw her as a consult.  At that time she was started on Lasix 40 mg daily and referred her to Dr. Bridgette Habermann who saw her on 05/31/21.   She has follow up at HF clinic on 3/31.  Will ask Dr. Ali Lowe to review and will call PharmD back with update. ?

## 2021-06-10 DIAGNOSIS — I130 Hypertensive heart and chronic kidney disease with heart failure and stage 1 through stage 4 chronic kidney disease, or unspecified chronic kidney disease: Secondary | ICD-10-CM | POA: Diagnosis not present

## 2021-06-10 DIAGNOSIS — I5023 Acute on chronic systolic (congestive) heart failure: Secondary | ICD-10-CM | POA: Diagnosis not present

## 2021-06-10 DIAGNOSIS — E1122 Type 2 diabetes mellitus with diabetic chronic kidney disease: Secondary | ICD-10-CM | POA: Diagnosis not present

## 2021-06-10 DIAGNOSIS — I2121 ST elevation (STEMI) myocardial infarction involving left circumflex coronary artery: Secondary | ICD-10-CM | POA: Diagnosis not present

## 2021-06-10 DIAGNOSIS — I340 Nonrheumatic mitral (valve) insufficiency: Secondary | ICD-10-CM | POA: Diagnosis not present

## 2021-06-10 DIAGNOSIS — N184 Chronic kidney disease, stage 4 (severe): Secondary | ICD-10-CM | POA: Diagnosis not present

## 2021-06-13 DIAGNOSIS — I340 Nonrheumatic mitral (valve) insufficiency: Secondary | ICD-10-CM | POA: Diagnosis not present

## 2021-06-13 DIAGNOSIS — N184 Chronic kidney disease, stage 4 (severe): Secondary | ICD-10-CM | POA: Diagnosis not present

## 2021-06-13 DIAGNOSIS — E1122 Type 2 diabetes mellitus with diabetic chronic kidney disease: Secondary | ICD-10-CM | POA: Diagnosis not present

## 2021-06-13 DIAGNOSIS — I130 Hypertensive heart and chronic kidney disease with heart failure and stage 1 through stage 4 chronic kidney disease, or unspecified chronic kidney disease: Secondary | ICD-10-CM | POA: Diagnosis not present

## 2021-06-13 DIAGNOSIS — I2121 ST elevation (STEMI) myocardial infarction involving left circumflex coronary artery: Secondary | ICD-10-CM | POA: Diagnosis not present

## 2021-06-13 DIAGNOSIS — I5023 Acute on chronic systolic (congestive) heart failure: Secondary | ICD-10-CM | POA: Diagnosis not present

## 2021-06-14 DIAGNOSIS — E1122 Type 2 diabetes mellitus with diabetic chronic kidney disease: Secondary | ICD-10-CM | POA: Diagnosis not present

## 2021-06-14 DIAGNOSIS — I130 Hypertensive heart and chronic kidney disease with heart failure and stage 1 through stage 4 chronic kidney disease, or unspecified chronic kidney disease: Secondary | ICD-10-CM | POA: Diagnosis not present

## 2021-06-14 DIAGNOSIS — I340 Nonrheumatic mitral (valve) insufficiency: Secondary | ICD-10-CM | POA: Diagnosis not present

## 2021-06-14 DIAGNOSIS — N184 Chronic kidney disease, stage 4 (severe): Secondary | ICD-10-CM | POA: Diagnosis not present

## 2021-06-14 DIAGNOSIS — I2121 ST elevation (STEMI) myocardial infarction involving left circumflex coronary artery: Secondary | ICD-10-CM | POA: Diagnosis not present

## 2021-06-14 DIAGNOSIS — I5023 Acute on chronic systolic (congestive) heart failure: Secondary | ICD-10-CM | POA: Diagnosis not present

## 2021-06-15 DIAGNOSIS — I130 Hypertensive heart and chronic kidney disease with heart failure and stage 1 through stage 4 chronic kidney disease, or unspecified chronic kidney disease: Secondary | ICD-10-CM | POA: Diagnosis not present

## 2021-06-15 DIAGNOSIS — I340 Nonrheumatic mitral (valve) insufficiency: Secondary | ICD-10-CM | POA: Diagnosis not present

## 2021-06-15 DIAGNOSIS — I2121 ST elevation (STEMI) myocardial infarction involving left circumflex coronary artery: Secondary | ICD-10-CM | POA: Diagnosis not present

## 2021-06-15 DIAGNOSIS — I5023 Acute on chronic systolic (congestive) heart failure: Secondary | ICD-10-CM | POA: Diagnosis not present

## 2021-06-15 DIAGNOSIS — E1122 Type 2 diabetes mellitus with diabetic chronic kidney disease: Secondary | ICD-10-CM | POA: Diagnosis not present

## 2021-06-15 DIAGNOSIS — N184 Chronic kidney disease, stage 4 (severe): Secondary | ICD-10-CM | POA: Diagnosis not present

## 2021-06-17 ENCOUNTER — Encounter (HOSPITAL_COMMUNITY): Payer: Self-pay

## 2021-06-17 ENCOUNTER — Ambulatory Visit (HOSPITAL_COMMUNITY)
Admission: RE | Admit: 2021-06-17 | Discharge: 2021-06-17 | Disposition: A | Discharge status: Home / Self Care | Payer: Medicare Other | Source: Ambulatory Visit | Attending: Family Medicine | Admitting: Family Medicine

## 2021-06-17 VITALS — BP 100/50 | HR 88 | Ht 61.0 in | Wt 149.6 lb

## 2021-06-17 DIAGNOSIS — I130 Hypertensive heart and chronic kidney disease with heart failure and stage 1 through stage 4 chronic kidney disease, or unspecified chronic kidney disease: Secondary | ICD-10-CM | POA: Insufficient documentation

## 2021-06-17 DIAGNOSIS — I255 Ischemic cardiomyopathy: Secondary | ICD-10-CM | POA: Insufficient documentation

## 2021-06-17 DIAGNOSIS — I5023 Acute on chronic systolic (congestive) heart failure: Secondary | ICD-10-CM | POA: Insufficient documentation

## 2021-06-17 DIAGNOSIS — N184 Chronic kidney disease, stage 4 (severe): Secondary | ICD-10-CM | POA: Insufficient documentation

## 2021-06-17 DIAGNOSIS — I340 Nonrheumatic mitral (valve) insufficiency: Secondary | ICD-10-CM | POA: Diagnosis not present

## 2021-06-17 DIAGNOSIS — I2510 Atherosclerotic heart disease of native coronary artery without angina pectoris: Secondary | ICD-10-CM | POA: Insufficient documentation

## 2021-06-17 DIAGNOSIS — Z79899 Other long term (current) drug therapy: Secondary | ICD-10-CM | POA: Diagnosis not present

## 2021-06-17 DIAGNOSIS — I509 Heart failure, unspecified: Secondary | ICD-10-CM

## 2021-06-17 DIAGNOSIS — Z955 Presence of coronary angioplasty implant and graft: Secondary | ICD-10-CM | POA: Insufficient documentation

## 2021-06-17 DIAGNOSIS — D631 Anemia in chronic kidney disease: Secondary | ICD-10-CM | POA: Insufficient documentation

## 2021-06-17 DIAGNOSIS — D649 Anemia, unspecified: Secondary | ICD-10-CM

## 2021-06-17 DIAGNOSIS — I252 Old myocardial infarction: Secondary | ICD-10-CM | POA: Diagnosis not present

## 2021-06-17 DIAGNOSIS — Z7984 Long term (current) use of oral hypoglycemic drugs: Secondary | ICD-10-CM | POA: Insufficient documentation

## 2021-06-17 LAB — CBC
HCT: 31 % — ABNORMAL LOW (ref 36.0–46.0)
Hemoglobin: 9.9 g/dL — ABNORMAL LOW (ref 12.0–15.0)
MCH: 28.2 pg (ref 26.0–34.0)
MCHC: 31.9 g/dL (ref 30.0–36.0)
MCV: 88.3 fL (ref 80.0–100.0)
Platelets: 277 10*3/uL (ref 150–400)
RBC: 3.51 MIL/uL — ABNORMAL LOW (ref 3.87–5.11)
RDW: 19.4 % — ABNORMAL HIGH (ref 11.5–15.5)
WBC: 10.4 10*3/uL (ref 4.0–10.5)
nRBC: 0 % (ref 0.0–0.2)

## 2021-06-17 LAB — FERRITIN: Ferritin: 153 ng/mL (ref 11–307)

## 2021-06-17 LAB — BRAIN NATRIURETIC PEPTIDE: B Natriuretic Peptide: 2771.3 pg/mL — ABNORMAL HIGH (ref 0.0–100.0)

## 2021-06-17 LAB — IRON AND TIBC
Iron: 24 ug/dL — ABNORMAL LOW (ref 28–170)
Saturation Ratios: 9 % — ABNORMAL LOW (ref 10.4–31.8)
TIBC: 256 ug/dL (ref 250–450)
UIBC: 232 ug/dL

## 2021-06-17 MED ORDER — TORSEMIDE 20 MG PO TABS: 20.0000 mg | ORAL_TABLET | Freq: Every day | ORAL | 3 refills | Status: DC

## 2021-06-17 NOTE — Progress Notes (Addendum)
? ?ADVANCED HF CLINIC CONSULT NOTE ? ?Primary Care: Deland Pretty, MD ?Nephrologist: Dr. Justin Mend. ?HF Cardiologist: Dr. Haroldine Laws ? ?HPI: ?Karen West is a 81 y.o. female with CAD status post PCI to the RCA, hypertension, CKD stage IV, anemia, diabetes, sleep apnea, mitral regurgitation and systolic heart failure. ? ?Admitted 2/23 for inferolateral STEMI.  Course c/b by severe mitral valve regurgitation, AKI, systolic heart failure and anemia. Underwent cath on 2/4 with multivessel coronary disease requiring PCI /DES CFX with medical therapy for 70% LAD. Plan to continue DAPT + praluent and zetia. Intolerant statins. Initial EF 30-35% but TEE 40-45% with severe mitral regurgitation.  Diuresed with IV lasix. Placed on bb, hydralazine/imdur, and SGLT2i. No diuretics with addition of SGLT2i. Plan to try an manage medically. Plan to send to [facility] to consider mitral valve replacem3nt. Discharged home, weight 143 lbs. ? ?Follow up with Dr. Ali Lowe 3/23, and referred to [facility] for percutaneous MVR. Lasix 40 mg daily started but reduced to every other day due to kidney function. ? ?Today she returns for post hospital HF follow up with her sister and daughter. Overall feeling "blase". She is SOB with minimal activity, + orthopnea. Not peeing much on Lasix. Denies abnormal bleeding, CP, dizziness, edema or palpitations. Appetite ok. No fever or chills. Weight at home 143 pounds. Taking all medications. Has HH RN/PT x 2 week. Limiting fluids and salt. ? ?Cardiac Studies: ?- Echo (2/23): EF 30-35% and severe MR ? ?- TEE (2/23): 35-40% severe MR ? ?- RHC (2/23): showed normal cardiac output  ?Findings: ?  ?Ao (non-invasive) = 132/77 ?RA = 3 ?RV = 60/8 ?PA = 55/21 (37) ?PCW = 22 (v waves to 40) ?Thermo cardiac output/index = 3.3/2.0 ?Fick CO/CI = 5.0/3.0 ?PVR = 4.5 (Fick) 3.0 (Thermal) ?FA sat = 98% ?PA sat = 63%, 64% ?PAPi  = 11  ?  ?Assessment: ?  ?1. Elevated filling pressure with prominent v-waves in PW tracing suggestive  of significant MR ?2. Preserved CO by Fick (reduced by thermo) ?3. Inability to pass swan wire and catheter into the left PA (Patient developed cough but no hemoptysis) ? ?Review of Systems: [y] = yes, [ ]  = no  ? ?General: Weight gain Blue.Reese ]; Weight loss [ ] ; Anorexia [ ] ; Fatigue Blue.Reese ]; Fever [ ] ; Chills [ ] ; Weakness Blue.Reese ]  ?Cardiac: Chest pain/pressure [ ] ; Resting SOB [ ] ; Exertional SOB Blue.Reese ]; Orthopnea [ y]; Pedal Edema [ ] ; Palpitations [ ] ; Syncope [ ] ; Presyncope [ ] ; Paroxysmal nocturnal dyspnea[ ]   ?Pulmonary: Cough [ ] ; Wheezing[ ] ; Hemoptysis[ ] ; Sputum [ ] ; Snoring [ ]   ?GI: Vomiting[ ] ; Dysphagia[ ] ; Melena[ ] ; Hematochezia [ ] ; Heartburn[ ] ; Abdominal pain [ ] ; Constipation [ ] ; Diarrhea [ ] ; BRBPR [ ]   ?GU: Hematuria[ ] ; Dysuria [ ] ; Nocturia[ ]   ?Vascular: Pain in legs with walking [ ] ; Pain in feet with lying flat [ ] ; Non-healing sores [ ] ; Stroke [ ] ; TIA [ ] ; Slurred speech [ ] ;  ?Neuro: Headaches[ ] ; Vertigo[ ] ; Seizures[ ] ; Paresthesias[ ] ;Blurred vision [ ] ; Diplopia [ ] ; Vision changes [ ]   ?Ortho/Skin: Arthritis [ ] ; Joint pain [ ] ; Muscle pain [ ] ; Joint swelling Blue.Reese ]; Back Pain [ ] ; Rash [ ]   ?Psych: Depression[ ] ; Anxiety[ ]   ?Heme: Bleeding problems [ ] ; Clotting disorders [ ] ; Anemia Blue.Reese ]  ?Endocrine: Diabetes [ y]; Thyroid dysfunction[y ] ? ?Past Medical History:  ?Diagnosis Date  ? Atherosclerosis of aorta ([place])   ?  a. 01/2017/03/2017 - noted on high res chest CTs.  ? Breast cancer ([place])   ? a. Bilateral --> s/p left mastectomy  ? Carotid artery disease ([place])   ? a. 05/2990 w/ 42-68% LICA stenosis and <34% RICA stenosis; b. 10/2015 Carotid U/S: < 50% BICA stenosis  ? Chest pain   ? a. 09/2011 MV: EF 68%, no ischemia/infarct.  ? Chronic anemia   ? Chronic headaches   ? denies  ? Coronary artery calcification seen on CT scan   ? a. 01/2017 High res CT: atherosclerotic calcification of the arterial vascularture, including severe involvement of the coronary arteries; b. 03/2017 CT Chest:  coronary and Ao atheroscelrosis.  ? GERD (gastroesophageal reflux disease)   ? History of echocardiogram   ? a. 09/2011 Echo: EF 55-60%, no rwma, triv AI, PASP 26mmHg.  ? Hyperlipidemia   ? Hypertension   ? Hyperthyroidism   ? Left upper lobe pulmonary nodule   ? a. 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 05/2017 s/p VATS.  ? Multinodular goiter   ? a. 02/2017 PET scan- Hypermetabolic nodule;  b. 04/9796 s/p thyroidectomy  ? Myocardial infarction [facility]) 06/2019  ? NONSTEMI  ? Obesity   ? OSA on CPAP   ? cpap  ? Osteoarthritis   ? Personal history of radiation therapy 1999  ? Right bundle branch block   ? Type II or unspecified type diabetes mellitus without mention of complication, uncontrolled   ? ?Current Outpatient Medications  ?Medication Sig Dispense Refill  ? Alirocumab (PRALUENT) 150 MG/ML SOAJ Inject 150 mg into the skin every 14 (fourteen) days. 1 mL 0  ? aspirin EC 81 MG tablet Take 81 mg by mouth daily. Swallow whole.    ? Cholecalciferol (VITAMIN D3) 50 MCG (2000 UT) TABS Take 2,000 Units by mouth daily.    ? empagliflozin (JARDIANCE) 10 MG TABS tablet Take 1 tablet (10 mg total) by mouth daily before breakfast. 30 tablet 6  ? ezetimibe (ZETIA) 10 MG tablet Take 10 mg by mouth every evening.    ? ferrous sulfate 325 (65 FE) MG tablet Take 325 mg by mouth 2 (two) times daily.    ? furosemide (LASIX) 40 MG tablet Take 1 tablet (40 mg total) by mouth every other day. 90 tablet 3  ? hydrALAZINE (APRESOLINE) 25 MG tablet Take 1.5 tablets (37.5 mg total) by mouth every 8 (eight) hours. 135 tablet 6  ? hydrocortisone cream 1 % Apply 1 application topically 2 (two) times daily as needed for itching.     ? insulin degludec (TRESIBA FLEXTOUCH) 100 UNIT/ML FlexTouch Pen 10 units    ? isosorbide mononitrate (IMDUR) 60 MG 24 hr tablet Take 1 tablet (60 mg total) by mouth daily. 30 tablet 6  ? KLOR-CON M10 10 MEQ tablet Take 10 mEq by  mouth daily.    ? levothyroxine (SYNTHROID, LEVOTHROID) 88 MCG tablet Take 88 mcg by mouth daily before breakfast.   5  ? linagliptin (TRADJENTA) 5 MG TABS tablet 1 tablet    ? metoprolol succinate (TOPROL-XL) 25 MG 24 hr tablet Take 0.5 tablets (12.5 mg total) by mouth at bedtime. 30 tablet 6  ? nitroGLYCERIN (NITROSTAT) 0.4 MG SL tablet Place 1 tablet (0.4 mg total) under the tongue every 5 (five) minutes as needed for chest pain. 25 tablet 3  ? Omega-3 Fatty Acids (FISH OIL) 1200 MG CAPS Take 1,200 mg by mouth 2 (two) times daily.     ?  omeprazole (PRILOSEC) 20 MG capsule Take 20 mg by mouth 2 (two) times daily.     ? ticagrelor (BRILINTA) 90 MG TABS tablet Take 1 tablet (90 mg total) by mouth 2 (two) times daily. 180 tablet 3  ? vitamin B-12 (CYANOCOBALAMIN) 1000 MCG tablet Take 1,000 mcg by mouth daily.    ? ?No current facility-administered medications for this encounter.  ? ?Allergies  ?Allergen Reactions  ? Tramadol Nausea And Vomiting and Other (See Comments)  ?  "got sick to my stomach and was throwing up blood; it put me in the hospital")  ? Lisinopril Rash and Other (See Comments)  ?  Renal failure ?  ? Rosuvastatin Other (See Comments)  ?  Other reaction(s): rhabdo  ? Tapazole [Methimazole] Itching and Rash  ? ?Social History  ? ?Socioeconomic History  ? Marital status: Widowed  ?  Spouse name: Not on file  ? Number of children: Not on file  ? Years of education: 76  ? Highest education level: Bachelor's degree (e.g., BA, AB, BS)  ?Occupational History  ? Occupation: retired  ?  Employer: RETIRED  ?  Comment: accountant  ?Tobacco Use  ? Smoking status: Never  ? Smokeless tobacco: Never  ? Tobacco comments:  ?  Never smoke 06/17/21  ?Vaping Use  ? Vaping Use: Never used  ?Substance and Sexual Activity  ? Alcohol use: Yes  ?  Comment: occ  ? Drug use: No  ? Sexual activity: Yes  ?  Partners: Male  ?  Birth control/protection: None  ?Other Topics Concern  ? Not on file  ?Social History Narrative  ? Lives  alone.  Four children.   ? ?Social Determinants of Health  ? ?Financial Resource Strain: Not on file  ?Food Insecurity: Not on file  ?Transportation Needs: Not on file  ?Physical Activity: Not on file  ?Stre

## 2021-06-17 NOTE — Progress Notes (Signed)
ReDS Vest / Clip - 06/17/21 4158   ? ?  ? ReDS Vest / Clip  ? Station Marker A   ? Ruler Value 31   ? ReDS Value Range High volume overload   ? ReDS Actual Value 42   ? ?  ?  ? ?  ? ? ?

## 2021-06-17 NOTE — Patient Instructions (Signed)
Medication Changes: ? ?Stop Lasix. ? ?Start Torsemide 40 mg ( 2 Tabs) daily for 3 days then, 20 mg ( 1 Tab ) daily after that ? ?Please make sure you take 10 meq (1 Tab ) of potassium every day. ? ? ?Lab Work: ? ?Labs done today, your results will be available in MyChart, we will contact you for abnormal readings. ? ? ?Testing/Procedures: ? ?Repeat blood work in 10 days ? ?Referrals: ? ?none ? ?Special Instructions // Education: ? ?Do the following things EVERYDAY: ?Weigh yourself in the morning before breakfast. Write it down and keep it in a log. ?Take your medicines as prescribed ?Eat low salt foods--Limit salt (sodium) to 2000 mg per day.  ?Stay as active as you can everyday ?Limit all fluids for the day to less than 2 liters ? ? ?Follow-Up in: with clinic in 2-3 weeks and Dr. Haroldine Laws in 2-3 months ? ?At the [facility], you and your health needs are our priority. We have a designated team specialized in the treatment of Heart Failure. This Care Team includes your primary Heart Failure Specialized Cardiologist (physician), Advanced Practice Providers (APPs- Physician Assistants and Nurse Practitioners), and Pharmacist who all work together to provide you with the care you need, when you need it.  ? ?You may see any of the following providers on your designated Care Team at your next follow up: ? ?Dr Glori Bickers ?Dr Loralie Champagne ?Darrick Grinder, NP ?Lyda Jester, PA ?Jessica Milford,NP ?Marlyce Huge, PA ?Audry Riles, PharmD ? ? ?Please be sure to bring in all your medications bottles to every appointment.  ? ?Need to Contact us: ? ?If you have any questions or concerns before your next appointment please send [place] a message through [place] or call our office at [phone].   ? ?TO LEAVE A MESSAGE FOR THE NURSE SELECT OPTION 2, PLEASE LEAVE A MESSAGE INCLUDING: ?YOUR NAME ?DATE OF BIRTH ?CALL BACK NUMBER ?REASON FOR CALL**this is important as we prioritize the call backs ? ?YOU WILL  RECEIVE A CALL BACK THE SAME DAY AS LONG AS YOU CALL BEFORE 4:00 PM ? ? ?

## 2021-06-17 NOTE — Addendum Note (Signed)
Encounter addended by: Rafael Bihari, FNP on: 06/17/2021 11:54 AM ? Actions taken: Clinical Note Signed

## 2021-06-18 DIAGNOSIS — I2121 ST elevation (STEMI) myocardial infarction involving left circumflex coronary artery: Secondary | ICD-10-CM | POA: Diagnosis not present

## 2021-06-18 DIAGNOSIS — Z7984 Long term (current) use of oral hypoglycemic drugs: Secondary | ICD-10-CM | POA: Diagnosis not present

## 2021-06-18 DIAGNOSIS — D649 Anemia, unspecified: Secondary | ICD-10-CM | POA: Diagnosis not present

## 2021-06-18 DIAGNOSIS — I340 Nonrheumatic mitral (valve) insufficiency: Secondary | ICD-10-CM | POA: Diagnosis not present

## 2021-06-18 DIAGNOSIS — I5023 Acute on chronic systolic (congestive) heart failure: Secondary | ICD-10-CM | POA: Diagnosis not present

## 2021-06-18 DIAGNOSIS — E1122 Type 2 diabetes mellitus with diabetic chronic kidney disease: Secondary | ICD-10-CM | POA: Diagnosis not present

## 2021-06-18 DIAGNOSIS — N184 Chronic kidney disease, stage 4 (severe): Secondary | ICD-10-CM | POA: Diagnosis not present

## 2021-06-18 DIAGNOSIS — Z7982 Long term (current) use of aspirin: Secondary | ICD-10-CM | POA: Diagnosis not present

## 2021-06-18 DIAGNOSIS — I130 Hypertensive heart and chronic kidney disease with heart failure and stage 1 through stage 4 chronic kidney disease, or unspecified chronic kidney disease: Secondary | ICD-10-CM | POA: Diagnosis not present

## 2021-06-18 DIAGNOSIS — Z7901 Long term (current) use of anticoagulants: Secondary | ICD-10-CM | POA: Diagnosis not present

## 2021-06-18 DIAGNOSIS — M79676 Pain in unspecified toe(s): Secondary | ICD-10-CM | POA: Diagnosis not present

## 2021-06-18 DIAGNOSIS — N179 Acute kidney failure, unspecified: Secondary | ICD-10-CM | POA: Diagnosis not present

## 2021-06-18 DIAGNOSIS — G4730 Sleep apnea, unspecified: Secondary | ICD-10-CM | POA: Diagnosis not present

## 2021-06-20 DIAGNOSIS — I340 Nonrheumatic mitral (valve) insufficiency: Secondary | ICD-10-CM | POA: Diagnosis not present

## 2021-06-20 DIAGNOSIS — I2121 ST elevation (STEMI) myocardial infarction involving left circumflex coronary artery: Secondary | ICD-10-CM | POA: Diagnosis not present

## 2021-06-20 DIAGNOSIS — I130 Hypertensive heart and chronic kidney disease with heart failure and stage 1 through stage 4 chronic kidney disease, or unspecified chronic kidney disease: Secondary | ICD-10-CM | POA: Diagnosis not present

## 2021-06-20 DIAGNOSIS — I5023 Acute on chronic systolic (congestive) heart failure: Secondary | ICD-10-CM | POA: Diagnosis not present

## 2021-06-22 DIAGNOSIS — I252 Old myocardial infarction: Secondary | ICD-10-CM | POA: Diagnosis not present

## 2021-06-22 DIAGNOSIS — N184 Chronic kidney disease, stage 4 (severe): Secondary | ICD-10-CM | POA: Diagnosis not present

## 2021-06-22 DIAGNOSIS — E1121 Type 2 diabetes mellitus with diabetic nephropathy: Secondary | ICD-10-CM | POA: Diagnosis not present

## 2021-06-22 DIAGNOSIS — I340 Nonrheumatic mitral (valve) insufficiency: Secondary | ICD-10-CM | POA: Diagnosis not present

## 2021-06-22 DIAGNOSIS — E039 Hypothyroidism, unspecified: Secondary | ICD-10-CM | POA: Diagnosis not present

## 2021-06-22 DIAGNOSIS — E785 Hyperlipidemia, unspecified: Secondary | ICD-10-CM | POA: Diagnosis not present

## 2021-06-22 DIAGNOSIS — D631 Anemia in chronic kidney disease: Secondary | ICD-10-CM | POA: Diagnosis not present

## 2021-06-22 DIAGNOSIS — I130 Hypertensive heart and chronic kidney disease with heart failure and stage 1 through stage 4 chronic kidney disease, or unspecified chronic kidney disease: Secondary | ICD-10-CM | POA: Diagnosis not present

## 2021-06-22 DIAGNOSIS — Z8739 Personal history of other diseases of the musculoskeletal system and connective tissue: Secondary | ICD-10-CM | POA: Diagnosis not present

## 2021-06-22 DIAGNOSIS — E1122 Type 2 diabetes mellitus with diabetic chronic kidney disease: Secondary | ICD-10-CM | POA: Diagnosis not present

## 2021-06-22 DIAGNOSIS — E876 Hypokalemia: Secondary | ICD-10-CM | POA: Diagnosis not present

## 2021-06-22 DIAGNOSIS — I5023 Acute on chronic systolic (congestive) heart failure: Secondary | ICD-10-CM | POA: Diagnosis not present

## 2021-06-22 DIAGNOSIS — I2121 ST elevation (STEMI) myocardial infarction involving left circumflex coronary artery: Secondary | ICD-10-CM | POA: Diagnosis not present

## 2021-06-22 DIAGNOSIS — I10 Essential (primary) hypertension: Secondary | ICD-10-CM | POA: Diagnosis not present

## 2021-06-22 NOTE — Progress Notes (Signed)
Patient ID: Karen West                 DOB: 04/17/1940                    MRN: [id number] ? ? ? ? ?HPI: ?Karen West is a 81 y.o. female patient of Dr.Thukkani referred to lipid clinic by Dr. Irish Lack during recent hospitalization. PMH is significant for CAD s/p PCI to RCA (on 07/03/2019), HTN, CKD stage IV, DM, anemia, sleep apnea, mitral regurgitation, and ischemic cardiomyopathy. Pt was admitted in 08/2019 with bilateral leg tenderness and elevated serum CK. On admission, CK was elevated to 5,585. Previously had doubling of her rosuvastatin dose to 40mg  daily prior to this and she developed leg tenderness without any trauma or inciting event. Held statin and deferred to PCP on d/c to determine if they wanted to restart in the future.  ? ?Pt was admitted at [facility] in Feb 2023 with chest pain and found to have a STEMI and underwent heart cath. Had total occlusion of LCx which was treated with DES. Prox LAD was 50% stenosed and prox LAD to mid LAD was 70% stenosed - no stents placed. Widely patent dominant RCA with previously placed 4 tandem stents on 07/03/2019. ECHO on 05/09/21 showed EF of 35-40%, severe akinesis of left ventricle, grade II diastolic function, severe MR, heavily calcified posterior leaflet, and mitral annular-calcification.  ? ?Pt reports to clinic today for lipid management. Pt is taking ezetimibe and Praulent, tolerating both. Receives her Praulent through the [employer] and pays $120 for 61-month supply of ezetimibe. Doesn't wish to take anything that will affect her kidney function. Wonders about the safety of staying on Tradjenta as she read it can increase the risk of heart failure and pancreatitis.  ? ?Current Medications: ezetimibe 10 mg daily, Praulent 150 mg [place] every 14 days, omega-3 fatty acids 1200 mg twice daily ?Intolerances: rosuvastatin 40 mg daily - rhabo (08/2019 admission) ?Risk Factors: Progressive ASCVD, DM, Fhx of premature CAD, CKD, HTN, HF ?LDL goal:  <55 ? ?Diet: Discussed limiting fried food and high fat food. Pt stated she only has those types of foods ~2x/year and is not a huge fast food person. ? ?Family History: Mother - died of MI at age 35. Sister - diabetes, stroke. Brother - diabetes, HTN, stroke ? ?Social History: Denies tobacco and illicit drug use. Occasional alcohol use. ? ?Labs: 04/23/21: TC 189, TG 90,  HDL 57, LDL 114 (on Praulent and ezetimibe) ?04/28/21: TC 123, TG 99, HDL 45, LDL 58 (on Praulent and ezetimibe) ? ?Past Medical History:  ?Diagnosis Date  ? Atherosclerosis of aorta ([place])   ? a. 01/2017/03/2017 - noted on high res chest CTs.  ? Breast cancer ([place])   ? a. Bilateral --> s/p left mastectomy  ? Carotid artery disease ([place])   ? a. 07/6385 w/ 56-43% LICA stenosis and <32% RICA stenosis; b. 10/2015 Carotid U/S: < 50% BICA stenosis  ? Chest pain   ? a. 09/2011 MV: EF 68%, no ischemia/infarct.  ? Chronic anemia   ? Chronic headaches   ? denies  ? Coronary artery calcification seen on CT scan   ? a. 01/2017 High res CT: atherosclerotic calcification of the arterial vascularture, including severe involvement of the coronary arteries; b. 03/2017 CT Chest: coronary and Ao atheroscelrosis.  ? GERD (gastroesophageal reflux disease)   ? History of echocardiogram   ? a. 09/2011 Echo: EF 55-60%, no rwma, triv  AI, PASP 60mmHg.  ? Hyperlipidemia   ? Hypertension   ? Hyperthyroidism   ? Left upper lobe pulmonary nodule   ? a. 02/2017 PET: slowing enlarging 1.7cm LUL nodule w/ low-grade metabolic activity; b. 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 05/2017 s/p VATS.  ? Multinodular goiter   ? a. 02/2017 PET scan- Hypermetabolic nodule;  b. 04/3760 s/p thyroidectomy  ? Myocardial infarction [facility]) 06/2019  ? NONSTEMI  ? Obesity   ? OSA on CPAP   ? cpap  ? Osteoarthritis   ? Personal history of radiation therapy 1999  ? Right bundle branch block   ? Type II or unspecified type diabetes mellitus without mention of complication, uncontrolled   ? ? ?Current  Outpatient Medications on File Prior to Visit  ?Medication Sig Dispense Refill  ? Alirocumab (PRALUENT) 150 MG/ML SOAJ Inject 150 mg into the skin every 14 (fourteen) days. 1 mL 0  ? aspirin EC 81 MG tablet Take 81 mg by mouth daily. Swallow whole.    ? Cholecalciferol (VITAMIN D3) 50 MCG (2000 UT) TABS Take 2,000 Units by mouth daily.    ? empagliflozin (JARDIANCE) 10 MG TABS tablet Take 1 tablet (10 mg total) by mouth daily before breakfast. 30 tablet 6  ? ezetimibe (ZETIA) 10 MG tablet Take 10 mg by mouth every evening.    ? ferrous sulfate 325 (65 FE) MG tablet Take 325 mg by mouth 2 (two) times daily.    ? hydrALAZINE (APRESOLINE) 25 MG tablet Take 1.5 tablets (37.5 mg total) by mouth every 8 (eight) hours. 135 tablet 6  ? hydrocortisone cream 1 % Apply 1 application topically 2 (two) times daily as needed for itching.     ? insulin degludec (TRESIBA FLEXTOUCH) 100 UNIT/ML FlexTouch Pen 10 units    ? isosorbide mononitrate (IMDUR) 60 MG 24 hr tablet Take 1 tablet (60 mg total) by mouth daily. 30 tablet 6  ? KLOR-CON M10 10 MEQ tablet Take 10 mEq by mouth daily.    ? levothyroxine (SYNTHROID, LEVOTHROID) 88 MCG tablet Take 88 mcg by mouth daily before breakfast.   5  ? linagliptin (TRADJENTA) 5 MG TABS tablet 1 tablet    ? metoprolol succinate (TOPROL-XL) 25 MG 24 hr tablet Take 0.5 tablets (12.5 mg total) by mouth at bedtime. 30 tablet 6  ? nitroGLYCERIN (NITROSTAT) 0.4 MG SL tablet Place 1 tablet (0.4 mg total) under the tongue every 5 (five) minutes as needed for chest pain. 25 tablet 3  ? Omega-3 Fatty Acids (FISH OIL) 1200 MG CAPS Take 1,200 mg by mouth 2 (two) times daily.     ? omeprazole (PRILOSEC) 20 MG capsule Take 20 mg by mouth 2 (two) times daily.     ? ticagrelor (BRILINTA) 90 MG TABS tablet Take 1 tablet (90 mg total) by mouth 2 (two) times daily. 180 tablet 3  ? torsemide (DEMADEX) 20 MG tablet Take 1 tablet (20 mg total) by mouth daily. 90 tablet 3  ? vitamin B-12 (CYANOCOBALAMIN) 1000 MCG  tablet Take 1,000 mcg by mouth daily.    ? ?No current facility-administered medications on file prior to visit.  ? ? ?Allergies  ?Allergen Reactions  ? Tramadol Nausea And Vomiting and Other (See Comments)  ?  "got sick to my stomach and was throwing up blood; it put me in the hospital")  ? Lisinopril Rash and Other (See Comments)  ?  Renal failure ?  ? Rosuvastatin Other (See Comments)  ?  Other reaction(s): rhabdo  ?  Tapazole [Methimazole] Itching and Rash  ? ? ?Assessment/Plan: ? ?1. Hyperlipidemia - LDL 58 slightly above goal <55. Continue ezetimibe 10 mg daily and Praulent 150 mg every 2 weeks. Discussed stopping ezetimibe and switching to Nexlizet to bring her LDL to goal. Discussed potential side effects including risk of hyperuricemia and up to 5% increase in SCr. Pt seems willing to change but wishes to discuss with Dr. Shelia Media at her 07/04/21 visit first. She will check with Dr. Shelia Media about getting ezetimibe covered by the [employer] in the meantime since her 30-month supply copay is $120 and should be covered completely as her Praluent already is, and their office has her [employer]. ? ?2. Type 2 Diabetes - Pt's A1c currently well controlled at 6% on Tresiba, Jardiance 10 mg daily, and Tradjenta 5mg  daily. Pt concerned about staying on Tradjenta given history of HF and pancreatic cysts. FDA added warnings to all DDP-4 inhibitors regarding use in pts at high risk for HF given saxagliptin and alogliptin trials had an increased number of pts hospitalized when compared to placebo. However, linagliptin trials showed no difference, but one meta-analysis of 5 trials provided moderate-quality evidence of increased risk of hospitalization for HF in DDP-4 inhibitor users compared to placebo. Pt will f/u with Dr. Shelia Media regarding potentially stopping Tradjenta since her A1c is currently well controlled at 6%, her EF was 35-40% on most recent echo, and stopping med would save her a branded  copay. Office note has been faxed to Dr. Shelia Media. Will f/u with PharmD as needed. ? ?Patient seen with Debria Garret, [place] pharmacy student ? ?Megan E. Supple, PharmD, BCACP, CPP ?[place]

## 2021-06-23 ENCOUNTER — Ambulatory Visit (INDEPENDENT_AMBULATORY_CARE_PROVIDER_SITE_OTHER): Payer: Medicare Other | Admitting: Pharmacist

## 2021-06-23 ENCOUNTER — Other Ambulatory Visit: Payer: Medicare Other

## 2021-06-23 DIAGNOSIS — E785 Hyperlipidemia, unspecified: Secondary | ICD-10-CM

## 2021-06-23 DIAGNOSIS — T466X5A Adverse effect of antihyperlipidemic and antiarteriosclerotic drugs, initial encounter: Secondary | ICD-10-CM | POA: Diagnosis not present

## 2021-06-23 DIAGNOSIS — Z794 Long term (current) use of insulin: Secondary | ICD-10-CM | POA: Diagnosis not present

## 2021-06-23 DIAGNOSIS — E118 Type 2 diabetes mellitus with unspecified complications: Secondary | ICD-10-CM

## 2021-06-23 DIAGNOSIS — M6282 Rhabdomyolysis: Secondary | ICD-10-CM

## 2021-06-23 DIAGNOSIS — N184 Chronic kidney disease, stage 4 (severe): Secondary | ICD-10-CM

## 2021-06-23 DIAGNOSIS — I340 Nonrheumatic mitral (valve) insufficiency: Secondary | ICD-10-CM | POA: Diagnosis not present

## 2021-06-23 DIAGNOSIS — I255 Ischemic cardiomyopathy: Secondary | ICD-10-CM

## 2021-06-23 DIAGNOSIS — I152 Hypertension secondary to endocrine disorders: Secondary | ICD-10-CM

## 2021-06-23 DIAGNOSIS — E1169 Type 2 diabetes mellitus with other specified complication: Secondary | ICD-10-CM | POA: Diagnosis not present

## 2021-06-23 DIAGNOSIS — E782 Mixed hyperlipidemia: Secondary | ICD-10-CM | POA: Diagnosis not present

## 2021-06-23 DIAGNOSIS — I2121 ST elevation (STEMI) myocardial infarction involving left circumflex coronary artery: Secondary | ICD-10-CM

## 2021-06-23 DIAGNOSIS — E1159 Type 2 diabetes mellitus with other circulatory complications: Secondary | ICD-10-CM | POA: Diagnosis not present

## 2021-06-23 NOTE — Patient Instructions (Addendum)
It was nice to see you today! ? ?Continue taking your current medications ? ?Your LDL cholesterol goal is < 55. This level was 114 and then 58 when it was checked in the hospital in February ? ?We talked about changing your Zetia to Nexlizet. This would lower your LDL cholesterol an extra 20-25% ? ?Ask your primary care doctor if you need to continue on Tradjenta. Your A1c was 6% most recently ? ? ? ?

## 2021-06-24 ENCOUNTER — Telehealth (HOSPITAL_COMMUNITY): Payer: Self-pay

## 2021-06-24 NOTE — Telephone Encounter (Addendum)
Pt aware, agreeable, and verbalized understanding ? ? ?----- Message from Rafael Bihari, FNP sent at 06/17/2021  4:35 PM EDT ----- ?BNP elevated, iron low. Diuretics adjusted during visit today. Continue oral iron, when diuresed, can discussed iron infusions. ?

## 2021-06-28 ENCOUNTER — Telehealth: Payer: Self-pay | Admitting: Internal Medicine

## 2021-06-28 DIAGNOSIS — E1122 Type 2 diabetes mellitus with diabetic chronic kidney disease: Secondary | ICD-10-CM | POA: Diagnosis not present

## 2021-06-28 DIAGNOSIS — I5023 Acute on chronic systolic (congestive) heart failure: Secondary | ICD-10-CM | POA: Diagnosis not present

## 2021-06-28 DIAGNOSIS — I130 Hypertensive heart and chronic kidney disease with heart failure and stage 1 through stage 4 chronic kidney disease, or unspecified chronic kidney disease: Secondary | ICD-10-CM | POA: Diagnosis not present

## 2021-06-28 DIAGNOSIS — N184 Chronic kidney disease, stage 4 (severe): Secondary | ICD-10-CM | POA: Diagnosis not present

## 2021-06-28 DIAGNOSIS — I2121 ST elevation (STEMI) myocardial infarction involving left circumflex coronary artery: Secondary | ICD-10-CM | POA: Diagnosis not present

## 2021-06-28 DIAGNOSIS — I340 Nonrheumatic mitral (valve) insufficiency: Secondary | ICD-10-CM | POA: Diagnosis not present

## 2021-06-28 NOTE — Telephone Encounter (Signed)
Patient is requesting a copy of 4/06 lab results. ?

## 2021-06-28 NOTE — Telephone Encounter (Signed)
Called and spoke directly to patient to let her know that Dr Ali Lowe has not reviewed labs from 4/6 yet, but that she can view and print her results from her [place]. Pt states she tried, but could not see them. I noticed that potassium and co2 are still showing pending. I advised the patient to give it more time and once these result, she will likely be able to see everything. Pt agrees. ?

## 2021-06-29 ENCOUNTER — Emergency Department (HOSPITAL_COMMUNITY): Payer: Medicare Other

## 2021-06-29 ENCOUNTER — Emergency Department (HOSPITAL_COMMUNITY)
Admission: EM | Admit: 2021-06-29 | Discharge: 2021-06-30 | Disposition: A | Discharge status: Home / Self Care | Payer: Medicare Other | Attending: Emergency Medicine | Admitting: Emergency Medicine

## 2021-06-29 ENCOUNTER — Other Ambulatory Visit: Payer: Self-pay

## 2021-06-29 ENCOUNTER — Encounter (HOSPITAL_COMMUNITY): Payer: Self-pay | Admitting: Emergency Medicine

## 2021-06-29 DIAGNOSIS — R779 Abnormality of plasma protein, unspecified: Secondary | ICD-10-CM | POA: Diagnosis not present

## 2021-06-29 DIAGNOSIS — E1122 Type 2 diabetes mellitus with diabetic chronic kidney disease: Secondary | ICD-10-CM | POA: Diagnosis not present

## 2021-06-29 DIAGNOSIS — R1032 Left lower quadrant pain: Secondary | ICD-10-CM | POA: Diagnosis not present

## 2021-06-29 DIAGNOSIS — R7989 Other specified abnormal findings of blood chemistry: Secondary | ICD-10-CM | POA: Diagnosis not present

## 2021-06-29 DIAGNOSIS — Z7982 Long term (current) use of aspirin: Secondary | ICD-10-CM | POA: Insufficient documentation

## 2021-06-29 DIAGNOSIS — Z794 Long term (current) use of insulin: Secondary | ICD-10-CM | POA: Diagnosis not present

## 2021-06-29 DIAGNOSIS — I340 Nonrheumatic mitral (valve) insufficiency: Secondary | ICD-10-CM | POA: Diagnosis not present

## 2021-06-29 DIAGNOSIS — I509 Heart failure, unspecified: Secondary | ICD-10-CM | POA: Diagnosis not present

## 2021-06-29 DIAGNOSIS — N184 Chronic kidney disease, stage 4 (severe): Secondary | ICD-10-CM | POA: Insufficient documentation

## 2021-06-29 DIAGNOSIS — I130 Hypertensive heart and chronic kidney disease with heart failure and stage 1 through stage 4 chronic kidney disease, or unspecified chronic kidney disease: Secondary | ICD-10-CM | POA: Diagnosis not present

## 2021-06-29 DIAGNOSIS — R0789 Other chest pain: Secondary | ICD-10-CM | POA: Diagnosis not present

## 2021-06-29 DIAGNOSIS — K5730 Diverticulosis of large intestine without perforation or abscess without bleeding: Secondary | ICD-10-CM | POA: Diagnosis not present

## 2021-06-29 DIAGNOSIS — R0602 Shortness of breath: Secondary | ICD-10-CM | POA: Diagnosis present

## 2021-06-29 DIAGNOSIS — R109 Unspecified abdominal pain: Secondary | ICD-10-CM

## 2021-06-29 DIAGNOSIS — I2510 Atherosclerotic heart disease of native coronary artery without angina pectoris: Secondary | ICD-10-CM | POA: Diagnosis not present

## 2021-06-29 DIAGNOSIS — I5023 Acute on chronic systolic (congestive) heart failure: Secondary | ICD-10-CM | POA: Diagnosis not present

## 2021-06-29 DIAGNOSIS — J181 Lobar pneumonia, unspecified organism: Secondary | ICD-10-CM | POA: Diagnosis not present

## 2021-06-29 DIAGNOSIS — R1012 Left upper quadrant pain: Secondary | ICD-10-CM | POA: Diagnosis not present

## 2021-06-29 DIAGNOSIS — J189 Pneumonia, unspecified organism: Secondary | ICD-10-CM

## 2021-06-29 DIAGNOSIS — R319 Hematuria, unspecified: Secondary | ICD-10-CM | POA: Diagnosis not present

## 2021-06-29 DIAGNOSIS — I2121 ST elevation (STEMI) myocardial infarction involving left circumflex coronary artery: Secondary | ICD-10-CM | POA: Diagnosis not present

## 2021-06-29 DIAGNOSIS — I491 Atrial premature depolarization: Secondary | ICD-10-CM | POA: Diagnosis not present

## 2021-06-29 LAB — CBC WITH DIFFERENTIAL/PLATELET
Abs Immature Granulocytes: 0.05 10*3/uL (ref 0.00–0.07)
Basophils Absolute: 0 10*3/uL (ref 0.0–0.1)
Basophils Relative: 0 %
Eosinophils Absolute: 0.1 10*3/uL (ref 0.0–0.5)
Eosinophils Relative: 1 %
HCT: 31.1 % — ABNORMAL LOW (ref 36.0–46.0)
Hemoglobin: 9.7 g/dL — ABNORMAL LOW (ref 12.0–15.0)
Immature Granulocytes: 1 %
Lymphocytes Relative: 13 %
Lymphs Abs: 1 10*3/uL (ref 0.7–4.0)
MCH: 28.3 pg (ref 26.0–34.0)
MCHC: 31.2 g/dL (ref 30.0–36.0)
MCV: 90.7 fL (ref 80.0–100.0)
Monocytes Absolute: 0.5 10*3/uL (ref 0.1–1.0)
Monocytes Relative: 6 %
Neutro Abs: 6.5 10*3/uL (ref 1.7–7.7)
Neutrophils Relative %: 79 %
Platelets: 324 10*3/uL (ref 150–400)
RBC: 3.43 MIL/uL — ABNORMAL LOW (ref 3.87–5.11)
RDW: 19.9 % — ABNORMAL HIGH (ref 11.5–15.5)
WBC: 8.2 10*3/uL (ref 4.0–10.5)
nRBC: 0 % (ref 0.0–0.2)

## 2021-06-29 LAB — COMPREHENSIVE METABOLIC PANEL
ALT: 48 U/L — ABNORMAL HIGH (ref 0–44)
AST: 50 U/L — ABNORMAL HIGH (ref 15–41)
Albumin: 3.1 g/dL — ABNORMAL LOW (ref 3.5–5.0)
Alkaline Phosphatase: 103 U/L (ref 38–126)
Anion gap: 12 (ref 5–15)
BUN: 52 mg/dL — ABNORMAL HIGH (ref 8–23)
CO2: 21 mmol/L — ABNORMAL LOW (ref 22–32)
Calcium: 8.6 mg/dL — ABNORMAL LOW (ref 8.9–10.3)
Chloride: 102 mmol/L (ref 98–111)
Creatinine, Ser: 1.98 mg/dL — ABNORMAL HIGH (ref 0.44–1.00)
GFR, Estimated: 25 mL/min — ABNORMAL LOW (ref >60)
Glucose, Bld: 92 mg/dL (ref 70–99)
Potassium: 3.9 mmol/L (ref 3.5–5.1)
Sodium: 135 mmol/L (ref 135–145)
Total Bilirubin: 1 mg/dL (ref 0.3–1.2)
Total Protein: 7.5 g/dL (ref 6.5–8.1)

## 2021-06-29 LAB — BRAIN NATRIURETIC PEPTIDE: B Natriuretic Peptide: 3309.2 pg/mL — ABNORMAL HIGH (ref 0.0–100.0)

## 2021-06-29 LAB — TROPONIN I (HIGH SENSITIVITY): Troponin I (High Sensitivity): 48 ng/L — ABNORMAL HIGH (ref <18)

## 2021-06-29 MED ORDER — FENTANYL CITRATE PF 50 MCG/ML IJ SOSY
50.0000 ug | PREFILLED_SYRINGE | Freq: Once | INTRAMUSCULAR | Status: AC
  Administered 2021-06-30: 50 ug via INTRAVENOUS
  Filled 2021-06-29: qty 1

## 2021-06-29 MED ORDER — FUROSEMIDE 10 MG/ML IJ SOLN
40.0000 mg | Freq: Once | INTRAMUSCULAR | Status: AC
  Administered 2021-06-30: 40 mg via INTRAVENOUS
  Filled 2021-06-29: qty 4

## 2021-06-29 NOTE — ED Triage Notes (Signed)
BIB EMS from home, complains of L flank pain for the past day. Pt reports blood in her urine to EMS. ? ?HR 90 ?BP 134/80 ?O2 96% Ra ?CBG 170 ?

## 2021-06-29 NOTE — ED Provider Notes (Signed)
?[place] DEPT ?Provider Note ? ? ?CSN: [id number] ?Arrival date & time: 06/29/21  1717 ? ?  ? ?History ? ?Chief Complaint  ?Patient presents with  ? Shortness of Breath  ? ? ?Karen West is a 81 y.o. female. ? ?The history is provided by the patient and medical records.  ?Shortness of Breath ?Karen West is a 81 y.o. female who presents to the Emergency Department complaining of side pain.  She presents to the ED for evaluation of left side pain and sob that started Tuesday night.  Pain is along the left flank and is described as soreness.  Pain is constant.  Pain is better with sitting/resting.  Pain is worse with exertion.  She has associated DOE.   ? ?No fever, cough, chest pain, N/V, diarrhea, dysuria, leg swelling.  No prior similar sxs.  No recent injuries.   ?  ? ?Home Medications ?Prior to Admission medications   ?Medication Sig Start Date End Date Taking? Authorizing Provider  ?cefdinir (OMNICEF) 300 MG capsule Take 1 capsule (300 mg total) by mouth daily. 06/30/21  Yes Quintella Reichert, MD  ?doxycycline (VIBRAMYCIN) 100 MG capsule Take 1 capsule (100 mg total) by mouth 2 (two) times daily. 06/30/21  Yes Quintella Reichert, MD  ?Alirocumab (PRALUENT) 150 MG/ML SOAJ Inject 150 mg into the skin every 14 (fourteen) days. 02/23/20   Minus Breeding, MD  ?aspirin EC 81 MG tablet Take 81 mg by mouth daily. Swallow whole.    [provider]  ?Cholecalciferol (VITAMIN D3) 50 MCG (2000 UT) TABS Take 2,000 Units by mouth daily.    [provider]  ?empagliflozin (JARDIANCE) 10 MG TABS tablet Take 1 tablet (10 mg total) by mouth daily before breakfast. 05/17/21   Clegg, Amy D, NP  ?ezetimibe (ZETIA) 10 MG tablet Take 10 mg by mouth every evening.    [provider]  ?ferrous sulfate 325 (65 FE) MG tablet Take 325 mg by mouth 2 (two) times daily. 09/27/20   [provider]  ?hydrALAZINE (APRESOLINE) 25 MG tablet Take 1.5 tablets (37.5 mg total) by  mouth every 8 (eight) hours. 05/17/21   Clegg, Amy D, NP  ?hydrocortisone cream 1 % Apply 1 application topically 2 (two) times daily as needed for itching.     [provider]  ?insulin degludec (TRESIBA FLEXTOUCH) 100 UNIT/ML FlexTouch Pen 10 units 06/14/21   [provider]  ?isosorbide mononitrate (IMDUR) 60 MG 24 hr tablet Take 1 tablet (60 mg total) by mouth daily. 05/18/21   Clegg, Amy D, NP  ?KLOR-CON M10 10 MEQ tablet Take 10 mEq by mouth daily. 06/01/21   [provider]  ?levothyroxine (SYNTHROID, LEVOTHROID) 88 MCG tablet Take 88 mcg by mouth daily before breakfast.  07/02/17   [provider]  ?linagliptin (TRADJENTA) 5 MG TABS tablet 1 tablet 05/30/21   [provider]  ?metoprolol succinate (TOPROL-XL) 25 MG 24 hr tablet Take 0.5 tablets (12.5 mg total) by mouth at bedtime. 05/17/21   Clegg, Amy D, NP  ?nitroGLYCERIN (NITROSTAT) 0.4 MG SL tablet Place 1 tablet (0.4 mg total) under the tongue every 5 (five) minutes as needed for chest pain. 10/28/19 04/24/22  Deberah Pelton, NP  ?Omega-3 Fatty Acids (FISH OIL) 1200 MG CAPS Take 1,200 mg by mouth 2 (two) times daily.     [provider]  ?omeprazole (PRILOSEC) 20 MG capsule Take 20 mg by mouth 2 (two) times daily.     [provider]  ?ticagrelor (BRILINTA) 90 MG TABS tablet Take 1 tablet (90 mg total) by mouth 2 (two) times daily. 06/01/21   Minus Breeding, MD  ?torsemide (DEMADEX) 20 MG tablet Take 1 tablet (20 mg total) by mouth daily. 06/17/21   Rafael Bihari, FNP  ?vitamin B-12 (CYANOCOBALAMIN) 1000 MCG tablet Take 1,000 mcg by mouth daily.    [provider]  ?   ? ?Allergies    ?Tramadol, Lisinopril, Rosuvastatin, and Tapazole [methimazole]   ? ?Review of Systems   ?Review of Systems  ?Respiratory:  Positive for shortness of breath.   ?All other systems reviewed and are negative. ? ?Physical Exam ?Updated Vital Signs ?BP 125/85   Pulse 83   Temp 98 ?F (36.7 ?C) (Oral)   Resp 17    Ht 5\' 1"  (1.549 m)   Wt 68 kg   SpO2 96%   BMI 28.33 kg/m?  ?Physical Exam ?Vitals and nursing note reviewed.  ?Constitutional:   ?   Appearance: She is well-developed.  ?HENT:  ?   Head: Normocephalic and atraumatic.  ?Cardiovascular:  ?   Rate and Rhythm: Normal rate and regular rhythm.  ?   Heart sounds: No murmur heard. ?Pulmonary:  ?   Effort: Pulmonary effort is normal. No respiratory distress.  ?   Breath sounds: Normal breath sounds.  ?Abdominal:  ?   Palpations: Abdomen is soft.  ?   Tenderness: There is no guarding or rebound.  ?   Comments: Mild to moderate tenderness to palpation over the LUQ/LLQ  ?Musculoskeletal:     ?   General: No swelling or tenderness.  ?   Comments: 2+ DP pulses bilaterally  ?Skin: ?   General: Skin is warm and dry.  ?Neurological:  ?   Mental Status: She is alert and oriented to person, place, and time.  ?Psychiatric:     ?   Behavior: Behavior normal.  ? ? ?ED Results / Procedures / Treatments   ?Labs ?(all labs ordered are listed, but only abnormal results are displayed) ?Labs Reviewed  ?CBC WITH DIFFERENTIAL/PLATELET - Abnormal; Notable for the following components:  ?    Result Value  ? RBC 3.43 (*)   ? Hemoglobin 9.7 (*)   ? HCT 31.1 (*)   ? RDW 19.9 (*)   ? All other components within normal limits  ?COMPREHENSIVE METABOLIC PANEL - Abnormal; Notable for the following components:  ? CO2 21 (*)   ? BUN 52 (*)   ? Creatinine, Ser 1.98 (*)   ? Calcium 8.6 (*)   ? Albumin 3.1 (*)   ? AST 50 (*)   ? ALT 48 (*)   ? GFR, Estimated 25 (*)   ? All other components within normal limits  ?BRAIN NATRIURETIC PEPTIDE - Abnormal; Notable for the following components:  ? B Natriuretic Peptide 3,309.2 (*)   ? All other components within normal limits  ?LIPASE, BLOOD - Abnormal; Notable for the following components:  ? Lipase 84 (*)   ? All other components within normal limits  ?URINALYSIS, ROUTINE W REFLEX MICROSCOPIC - Abnormal; Notable for the following components:  ? Color, Urine  COLORLESS (*)   ? Specific Gravity, Urine 1.004 (*)   ? Glucose, UA 50 (*)   ? All other components within normal limits  ?TROPONIN I (HIGH SENSITIVITY) - Abnormal; Notable for the following components:  ? Troponin I (High Sensitivity) 48 (*)   ? All other components within normal limits  ?TROPONIN I (  HIGH SENSITIVITY) - Abnormal; Notable for the following components:  ? Troponin I (High Sensitivity) 60 (*)   ? All other components within normal limits  ? ? ?EKG ?EKG Interpretation ? ?Date/Time:  Wednesday June 29 2021 17:37:36 EDT ?Ventricular Rate:  87 ?PR Interval:  124 ?QRS Duration: 126 ?QT Interval:  423 ?QTC Calculation: 509 ?R Axis:   132 ?Text Interpretation: Sinus rhythm Right bundle branch block Abnormal lateral Q waves Confirmed by Quintella Reichert [phone]) on 06/29/2021 11:16:32 PM ? ?Radiology ?CT Abdomen Pelvis Wo Contrast ? ?Result Date: 06/29/2021 ?CLINICAL DATA:  Left-sided flank pain EXAM: CT ABDOMEN AND PELVIS WITHOUT CONTRAST TECHNIQUE: Multidetector CT imaging of the abdomen and pelvis was performed following the standard protocol without IV contrast. RADIATION DOSE REDUCTION: This exam was performed according to the departmental dose-optimization program which includes automated exposure control, adjustment of the mA and/or kV according to patient size and/or use of iterative reconstruction technique. COMPARISON:  09/19/2019, MRI from 11/11/2019, CT from 02/28/2017 FINDINGS: Lower chest: Small pleural effusions are noted bilaterally. Right lower lobe infiltrate is seen consistent with acute pneumonia. Hepatobiliary: No focal liver abnormality is seen. No gallstones, gallbladder wall thickening, or biliary dilatation. Pancreas: Pancreas again demonstrates multiple cystic lesion the most prominent of which lies in the uncinate process measuring up to 2.7 cm. This is stable when compare with the prior exam. Spleen: Normal in size without focal abnormality. Adrenals/Urinary Tract: Adrenal glands are  within normal limits bilaterally. Kidneys are well visualized bilaterally. Multiple hyperdense and isodense lesions are noted which appears stable in appearance from the prior exam. No renal calculi are seen. N

## 2021-06-29 NOTE — ED Provider Triage Note (Signed)
Emergency Medicine Provider Triage Evaluation Note ? ?Karen West , a 81 y.o. female  was evaluated in triage.  Pt complains of gradual onset, constant, sharp, L sided rib pain that began last night. Pt reports her home health nurse came and evaluated her today and felt like her respiratory rate was elevated.  They called patient's PCP who advised to come to the ED for further evaluation.  MS reported flank pain however patient denies any specific flank pain, nausea, vomiting, abdominal pain, urinary symptoms.  She does report history of heart failure in the past however compliant on her Lasix.  Denies any leg swelling or abdominal distention.  ? ?Review of Systems  ?Positive: + left sided rib pain, SOB ?Negative: - cough, fevers, abd pain, nausea, vomiting ? ?Physical Exam  ?BP 129/74 (BP Location: Right Arm)   Pulse 87   Temp 97.7 ?F (36.5 ?C) (Oral)   Resp 20   Ht 5\' 1"  (1.549 m)   Wt 68 kg   SpO2 96%   BMI 28.33 kg/m?  ?Gen:   Awake, no distress   ?Resp:  Normal effort  ?MSK:   Moves extremities without difficulty  ?Other:  + left rib TTP ? ?Medical Decision Making  ?Medically screening exam initiated at 5:33 PM.  Appropriate orders placed.  Karen West was informed that the remainder of the evaluation will be completed by another provider, this initial triage assessment does not replace that evaluation, and the importance of remaining in the ED until their evaluation is complete. ? ? ?  ?Karen Maize, PA-C ?06/29/21 1736 ? ?

## 2021-06-30 ENCOUNTER — Telehealth (HOSPITAL_COMMUNITY): Payer: Self-pay

## 2021-06-30 DIAGNOSIS — J181 Lobar pneumonia, unspecified organism: Secondary | ICD-10-CM | POA: Diagnosis not present

## 2021-06-30 LAB — URINALYSIS, ROUTINE W REFLEX MICROSCOPIC
Bilirubin Urine: NEGATIVE
Glucose, UA: 50 mg/dL — AB
Hgb urine dipstick: NEGATIVE
Ketones, ur: NEGATIVE mg/dL
Leukocytes,Ua: NEGATIVE
Nitrite: NEGATIVE
Protein, ur: NEGATIVE mg/dL
Specific Gravity, Urine: 1.004 — ABNORMAL LOW (ref 1.005–1.030)
pH: 6 (ref 5.0–8.0)

## 2021-06-30 LAB — TROPONIN I (HIGH SENSITIVITY): Troponin I (High Sensitivity): 60 ng/L — ABNORMAL HIGH (ref <18)

## 2021-06-30 LAB — LIPASE, BLOOD: Lipase: 84 U/L — ABNORMAL HIGH (ref 11–51)

## 2021-06-30 MED ORDER — SODIUM CHLORIDE 0.9 % IV SOLN
1.0000 g | Freq: Once | INTRAVENOUS | Status: AC
  Administered 2021-06-30: 1 g via INTRAVENOUS
  Filled 2021-06-30: qty 10

## 2021-06-30 MED ORDER — DOXYCYCLINE HYCLATE 100 MG PO TABS
100.0000 mg | ORAL_TABLET | Freq: Once | ORAL | Status: AC
Start: 2021-06-30 — End: 2021-06-30
  Administered 2021-06-30: 100 mg via ORAL
  Filled 2021-06-30: qty 1

## 2021-06-30 MED ORDER — CEFDINIR 300 MG PO CAPS: 300.0000 mg | ORAL_CAPSULE | Freq: Every day | ORAL | 0 refills | Status: DC

## 2021-06-30 MED ORDER — DOXYCYCLINE HYCLATE 100 MG PO CAPS: 100.0000 mg | ORAL_CAPSULE | Freq: Two times a day (BID) | ORAL | 0 refills | Status: DC

## 2021-06-30 MED ORDER — ACETAMINOPHEN 325 MG PO TABS
650.0000 mg | ORAL_TABLET | Freq: Once | ORAL | Status: AC
  Administered 2021-06-30: 650 mg via ORAL
  Filled 2021-06-30: qty 2

## 2021-06-30 NOTE — Discharge Instructions (Addendum)
You can use lidoderm cream available over the counter according to label instructions as needed for pain.   ?

## 2021-06-30 NOTE — Telephone Encounter (Signed)
Patient called to inform of recent pneumonia diagnosis. Called patient and told her that we will reschedule her appointment so that she can recover from her pneumonia.  Karen Grinder NP informed as she is in APP clinic that day  ?

## 2021-07-01 ENCOUNTER — Encounter (HOSPITAL_COMMUNITY): Payer: Medicare Other

## 2021-07-01 DIAGNOSIS — I340 Nonrheumatic mitral (valve) insufficiency: Secondary | ICD-10-CM | POA: Diagnosis not present

## 2021-07-01 DIAGNOSIS — I5023 Acute on chronic systolic (congestive) heart failure: Secondary | ICD-10-CM | POA: Diagnosis not present

## 2021-07-01 DIAGNOSIS — I2121 ST elevation (STEMI) myocardial infarction involving left circumflex coronary artery: Secondary | ICD-10-CM | POA: Diagnosis not present

## 2021-07-01 DIAGNOSIS — N184 Chronic kidney disease, stage 4 (severe): Secondary | ICD-10-CM | POA: Diagnosis not present

## 2021-07-01 DIAGNOSIS — I130 Hypertensive heart and chronic kidney disease with heart failure and stage 1 through stage 4 chronic kidney disease, or unspecified chronic kidney disease: Secondary | ICD-10-CM | POA: Diagnosis not present

## 2021-07-01 DIAGNOSIS — E1122 Type 2 diabetes mellitus with diabetic chronic kidney disease: Secondary | ICD-10-CM | POA: Diagnosis not present

## 2021-07-04 DIAGNOSIS — I5023 Acute on chronic systolic (congestive) heart failure: Secondary | ICD-10-CM | POA: Diagnosis not present

## 2021-07-04 DIAGNOSIS — N184 Chronic kidney disease, stage 4 (severe): Secondary | ICD-10-CM | POA: Diagnosis not present

## 2021-07-04 DIAGNOSIS — D631 Anemia in chronic kidney disease: Secondary | ICD-10-CM | POA: Diagnosis not present

## 2021-07-04 DIAGNOSIS — E1121 Type 2 diabetes mellitus with diabetic nephropathy: Secondary | ICD-10-CM | POA: Diagnosis not present

## 2021-07-04 DIAGNOSIS — E1122 Type 2 diabetes mellitus with diabetic chronic kidney disease: Secondary | ICD-10-CM | POA: Diagnosis not present

## 2021-07-04 DIAGNOSIS — I10 Essential (primary) hypertension: Secondary | ICD-10-CM | POA: Diagnosis not present

## 2021-07-04 DIAGNOSIS — E785 Hyperlipidemia, unspecified: Secondary | ICD-10-CM | POA: Diagnosis not present

## 2021-07-04 DIAGNOSIS — E876 Hypokalemia: Secondary | ICD-10-CM | POA: Diagnosis not present

## 2021-07-04 DIAGNOSIS — J189 Pneumonia, unspecified organism: Secondary | ICD-10-CM | POA: Diagnosis not present

## 2021-07-04 DIAGNOSIS — E039 Hypothyroidism, unspecified: Secondary | ICD-10-CM | POA: Diagnosis not present

## 2021-07-04 DIAGNOSIS — I130 Hypertensive heart and chronic kidney disease with heart failure and stage 1 through stage 4 chronic kidney disease, or unspecified chronic kidney disease: Secondary | ICD-10-CM | POA: Diagnosis not present

## 2021-07-04 DIAGNOSIS — I252 Old myocardial infarction: Secondary | ICD-10-CM | POA: Diagnosis not present

## 2021-07-04 DIAGNOSIS — I340 Nonrheumatic mitral (valve) insufficiency: Secondary | ICD-10-CM | POA: Diagnosis not present

## 2021-07-04 DIAGNOSIS — I2121 ST elevation (STEMI) myocardial infarction involving left circumflex coronary artery: Secondary | ICD-10-CM | POA: Diagnosis not present

## 2021-07-04 DIAGNOSIS — Z8739 Personal history of other diseases of the musculoskeletal system and connective tissue: Secondary | ICD-10-CM | POA: Diagnosis not present

## 2021-07-06 DIAGNOSIS — I2121 ST elevation (STEMI) myocardial infarction involving left circumflex coronary artery: Secondary | ICD-10-CM | POA: Diagnosis not present

## 2021-07-06 DIAGNOSIS — I130 Hypertensive heart and chronic kidney disease with heart failure and stage 1 through stage 4 chronic kidney disease, or unspecified chronic kidney disease: Secondary | ICD-10-CM | POA: Diagnosis not present

## 2021-07-06 DIAGNOSIS — I340 Nonrheumatic mitral (valve) insufficiency: Secondary | ICD-10-CM | POA: Diagnosis not present

## 2021-07-06 DIAGNOSIS — N184 Chronic kidney disease, stage 4 (severe): Secondary | ICD-10-CM | POA: Diagnosis not present

## 2021-07-06 DIAGNOSIS — I5023 Acute on chronic systolic (congestive) heart failure: Secondary | ICD-10-CM | POA: Diagnosis not present

## 2021-07-06 DIAGNOSIS — E1122 Type 2 diabetes mellitus with diabetic chronic kidney disease: Secondary | ICD-10-CM | POA: Diagnosis not present

## 2021-07-06 LAB — BASIC METABOLIC PANEL
BUN/Creatinine Ratio: 27 (ref 12–28)
BUN: 58 mg/dL — ABNORMAL HIGH (ref 8–27)
Calcium: 8.5 mg/dL — ABNORMAL LOW (ref 8.7–10.3)
Chloride: 94 mmol/L — ABNORMAL LOW (ref 96–106)
Creatinine, Ser: 2.16 mg/dL — ABNORMAL HIGH (ref 0.57–1.00)
Glucose: 218 mg/dL — ABNORMAL HIGH (ref 70–99)
Sodium: 132 mmol/L — ABNORMAL LOW (ref 134–144)
eGFR: 23 mL/min/{1.73_m2} — ABNORMAL LOW (ref >59)

## 2021-07-07 ENCOUNTER — Telehealth: Payer: Self-pay | Admitting: Pharmacist

## 2021-07-07 NOTE — Telephone Encounter (Signed)
Received message from PharmD at Dr Pennie Banter office that pt was agreeable to start on Nexletol samples at first, then will transition to [place] after. Med list updated. ?

## 2021-07-11 ENCOUNTER — Other Ambulatory Visit: Payer: Self-pay

## 2021-07-11 ENCOUNTER — Emergency Department (HOSPITAL_COMMUNITY): Payer: Medicare Other

## 2021-07-11 ENCOUNTER — Inpatient Hospital Stay (HOSPITAL_COMMUNITY)
Admission: EM | Admit: 2021-07-11 | Discharge: 2021-07-20 | DRG: 291 | Disposition: A | Discharge status: Hospice | Payer: Medicare Other | Attending: Internal Medicine | Admitting: Internal Medicine

## 2021-07-11 ENCOUNTER — Encounter (HOSPITAL_COMMUNITY): Payer: Self-pay | Admitting: Emergency Medicine

## 2021-07-11 DIAGNOSIS — Z66 Do not resuscitate: Secondary | ICD-10-CM | POA: Diagnosis not present

## 2021-07-11 DIAGNOSIS — K219 Gastro-esophageal reflux disease without esophagitis: Secondary | ICD-10-CM | POA: Diagnosis present

## 2021-07-11 DIAGNOSIS — E1169 Type 2 diabetes mellitus with other specified complication: Secondary | ICD-10-CM | POA: Diagnosis present

## 2021-07-11 DIAGNOSIS — I252 Old myocardial infarction: Secondary | ICD-10-CM

## 2021-07-11 DIAGNOSIS — Z9012 Acquired absence of left breast and nipple: Secondary | ICD-10-CM

## 2021-07-11 DIAGNOSIS — R079 Chest pain, unspecified: Secondary | ICD-10-CM | POA: Diagnosis not present

## 2021-07-11 DIAGNOSIS — N179 Acute kidney failure, unspecified: Secondary | ICD-10-CM | POA: Diagnosis not present

## 2021-07-11 DIAGNOSIS — Z8249 Family history of ischemic heart disease and other diseases of the circulatory system: Secondary | ICD-10-CM

## 2021-07-11 DIAGNOSIS — Z7982 Long term (current) use of aspirin: Secondary | ICD-10-CM

## 2021-07-11 DIAGNOSIS — I4510 Unspecified right bundle-branch block: Secondary | ICD-10-CM | POA: Diagnosis present

## 2021-07-11 DIAGNOSIS — R7989 Other specified abnormal findings of blood chemistry: Secondary | ICD-10-CM | POA: Diagnosis present

## 2021-07-11 DIAGNOSIS — N184 Chronic kidney disease, stage 4 (severe): Secondary | ICD-10-CM | POA: Diagnosis present

## 2021-07-11 DIAGNOSIS — Z888 Allergy status to other drugs, medicaments and biological substances status: Secondary | ICD-10-CM

## 2021-07-11 DIAGNOSIS — M109 Gout, unspecified: Secondary | ICD-10-CM | POA: Diagnosis present

## 2021-07-11 DIAGNOSIS — M7989 Other specified soft tissue disorders: Secondary | ICD-10-CM | POA: Diagnosis not present

## 2021-07-11 DIAGNOSIS — R579 Shock, unspecified: Secondary | ICD-10-CM | POA: Diagnosis not present

## 2021-07-11 DIAGNOSIS — E1122 Type 2 diabetes mellitus with diabetic chronic kidney disease: Secondary | ICD-10-CM | POA: Diagnosis present

## 2021-07-11 DIAGNOSIS — I82409 Acute embolism and thrombosis of unspecified deep veins of unspecified lower extremity: Secondary | ICD-10-CM | POA: Diagnosis present

## 2021-07-11 DIAGNOSIS — D631 Anemia in chronic kidney disease: Secondary | ICD-10-CM | POA: Diagnosis present

## 2021-07-11 DIAGNOSIS — Z85118 Personal history of other malignant neoplasm of bronchus and lung: Secondary | ICD-10-CM

## 2021-07-11 DIAGNOSIS — M19011 Primary osteoarthritis, right shoulder: Secondary | ICD-10-CM | POA: Diagnosis present

## 2021-07-11 DIAGNOSIS — I480 Paroxysmal atrial fibrillation: Secondary | ICD-10-CM | POA: Diagnosis present

## 2021-07-11 DIAGNOSIS — E890 Postprocedural hypothyroidism: Secondary | ICD-10-CM | POA: Diagnosis present

## 2021-07-11 DIAGNOSIS — I340 Nonrheumatic mitral (valve) insufficiency: Secondary | ICD-10-CM | POA: Diagnosis not present

## 2021-07-11 DIAGNOSIS — Z8701 Personal history of pneumonia (recurrent): Secondary | ICD-10-CM

## 2021-07-11 DIAGNOSIS — E1165 Type 2 diabetes mellitus with hyperglycemia: Secondary | ICD-10-CM | POA: Diagnosis present

## 2021-07-11 DIAGNOSIS — E872 Acidosis, unspecified: Secondary | ICD-10-CM | POA: Diagnosis present

## 2021-07-11 DIAGNOSIS — I5020 Unspecified systolic (congestive) heart failure: Secondary | ICD-10-CM | POA: Diagnosis not present

## 2021-07-11 DIAGNOSIS — Z923 Personal history of irradiation: Secondary | ICD-10-CM

## 2021-07-11 DIAGNOSIS — E785 Hyperlipidemia, unspecified: Secondary | ICD-10-CM | POA: Diagnosis not present

## 2021-07-11 DIAGNOSIS — I4891 Unspecified atrial fibrillation: Secondary | ICD-10-CM | POA: Diagnosis not present

## 2021-07-11 DIAGNOSIS — K5900 Constipation, unspecified: Secondary | ICD-10-CM | POA: Diagnosis present

## 2021-07-11 DIAGNOSIS — G4733 Obstructive sleep apnea (adult) (pediatric): Secondary | ICD-10-CM | POA: Diagnosis not present

## 2021-07-11 DIAGNOSIS — I5084 End stage heart failure: Secondary | ICD-10-CM | POA: Diagnosis present

## 2021-07-11 DIAGNOSIS — I700 Atherosclerosis of aorta: Secondary | ICD-10-CM | POA: Diagnosis present

## 2021-07-11 DIAGNOSIS — E118 Type 2 diabetes mellitus with unspecified complications: Secondary | ICD-10-CM | POA: Diagnosis present

## 2021-07-11 DIAGNOSIS — Z902 Acquired absence of lung [part of]: Secondary | ICD-10-CM

## 2021-07-11 DIAGNOSIS — Z7902 Long term (current) use of antithrombotics/antiplatelets: Secondary | ICD-10-CM

## 2021-07-11 DIAGNOSIS — K5730 Diverticulosis of large intestine without perforation or abscess without bleeding: Secondary | ICD-10-CM | POA: Diagnosis not present

## 2021-07-11 DIAGNOSIS — R0789 Other chest pain: Secondary | ICD-10-CM | POA: Diagnosis not present

## 2021-07-11 DIAGNOSIS — R54 Age-related physical debility: Secondary | ICD-10-CM | POA: Diagnosis present

## 2021-07-11 DIAGNOSIS — Z833 Family history of diabetes mellitus: Secondary | ICD-10-CM

## 2021-07-11 DIAGNOSIS — K761 Chronic passive congestion of liver: Secondary | ICD-10-CM | POA: Diagnosis present

## 2021-07-11 DIAGNOSIS — Z794 Long term (current) use of insulin: Secondary | ICD-10-CM

## 2021-07-11 DIAGNOSIS — E876 Hypokalemia: Secondary | ICD-10-CM | POA: Diagnosis present

## 2021-07-11 DIAGNOSIS — I083 Combined rheumatic disorders of mitral, aortic and tricuspid valves: Secondary | ICD-10-CM | POA: Diagnosis present

## 2021-07-11 DIAGNOSIS — Z515 Encounter for palliative care: Secondary | ICD-10-CM | POA: Diagnosis not present

## 2021-07-11 DIAGNOSIS — I82442 Acute embolism and thrombosis of left tibial vein: Secondary | ICD-10-CM | POA: Diagnosis not present

## 2021-07-11 DIAGNOSIS — R945 Abnormal results of liver function studies: Secondary | ICD-10-CM | POA: Diagnosis not present

## 2021-07-11 DIAGNOSIS — M19012 Primary osteoarthritis, left shoulder: Secondary | ICD-10-CM | POA: Diagnosis present

## 2021-07-11 DIAGNOSIS — E039 Hypothyroidism, unspecified: Secondary | ICD-10-CM

## 2021-07-11 DIAGNOSIS — R031 Nonspecific low blood-pressure reading: Secondary | ICD-10-CM | POA: Diagnosis not present

## 2021-07-11 DIAGNOSIS — I5023 Acute on chronic systolic (congestive) heart failure: Secondary | ICD-10-CM | POA: Diagnosis present

## 2021-07-11 DIAGNOSIS — G4730 Sleep apnea, unspecified: Secondary | ICD-10-CM | POA: Diagnosis present

## 2021-07-11 DIAGNOSIS — I5022 Chronic systolic (congestive) heart failure: Secondary | ICD-10-CM | POA: Diagnosis not present

## 2021-07-11 DIAGNOSIS — Z853 Personal history of malignant neoplasm of breast: Secondary | ICD-10-CM

## 2021-07-11 DIAGNOSIS — I130 Hypertensive heart and chronic kidney disease with heart failure and stage 1 through stage 4 chronic kidney disease, or unspecified chronic kidney disease: Secondary | ICD-10-CM | POA: Diagnosis not present

## 2021-07-11 DIAGNOSIS — Z955 Presence of coronary angioplasty implant and graft: Secondary | ICD-10-CM

## 2021-07-11 DIAGNOSIS — R072 Precordial pain: Secondary | ICD-10-CM | POA: Diagnosis present

## 2021-07-11 DIAGNOSIS — I10 Essential (primary) hypertension: Secondary | ICD-10-CM | POA: Diagnosis not present

## 2021-07-11 DIAGNOSIS — I255 Ischemic cardiomyopathy: Secondary | ICD-10-CM | POA: Diagnosis present

## 2021-07-11 DIAGNOSIS — E871 Hypo-osmolality and hyponatremia: Secondary | ICD-10-CM | POA: Diagnosis present

## 2021-07-11 DIAGNOSIS — Z7189 Other specified counseling: Secondary | ICD-10-CM | POA: Diagnosis not present

## 2021-07-11 DIAGNOSIS — F419 Anxiety disorder, unspecified: Secondary | ICD-10-CM | POA: Diagnosis present

## 2021-07-11 DIAGNOSIS — Z79899 Other long term (current) drug therapy: Secondary | ICD-10-CM

## 2021-07-11 DIAGNOSIS — Z885 Allergy status to narcotic agent status: Secondary | ICD-10-CM

## 2021-07-11 DIAGNOSIS — I5043 Acute on chronic combined systolic (congestive) and diastolic (congestive) heart failure: Secondary | ICD-10-CM | POA: Diagnosis not present

## 2021-07-11 DIAGNOSIS — I2510 Atherosclerotic heart disease of native coronary artery without angina pectoris: Secondary | ICD-10-CM | POA: Diagnosis present

## 2021-07-11 DIAGNOSIS — Z7984 Long term (current) use of oral hypoglycemic drugs: Secondary | ICD-10-CM

## 2021-07-11 DIAGNOSIS — K8020 Calculus of gallbladder without cholecystitis without obstruction: Secondary | ICD-10-CM | POA: Diagnosis not present

## 2021-07-11 DIAGNOSIS — Z79890 Hormone replacement therapy (postmenopausal): Secondary | ICD-10-CM

## 2021-07-11 DIAGNOSIS — R918 Other nonspecific abnormal finding of lung field: Secondary | ICD-10-CM | POA: Diagnosis not present

## 2021-07-11 DIAGNOSIS — R519 Headache, unspecified: Secondary | ICD-10-CM | POA: Diagnosis present

## 2021-07-11 LAB — CBC WITH DIFFERENTIAL/PLATELET
Abs Immature Granulocytes: 0.07 10*3/uL (ref 0.00–0.07)
Basophils Absolute: 0 10*3/uL (ref 0.0–0.1)
Basophils Relative: 0 %
Eosinophils Absolute: 0 10*3/uL (ref 0.0–0.5)
Eosinophils Relative: 0 %
HCT: 36.8 % (ref 36.0–46.0)
Hemoglobin: 11.2 g/dL — ABNORMAL LOW (ref 12.0–15.0)
Immature Granulocytes: 1 %
Lymphocytes Relative: 2 %
Lymphs Abs: 0.2 10*3/uL — ABNORMAL LOW (ref 0.7–4.0)
MCH: 29.2 pg (ref 26.0–34.0)
MCHC: 30.4 g/dL (ref 30.0–36.0)
MCV: 95.8 fL (ref 80.0–100.0)
Monocytes Absolute: 0.6 10*3/uL (ref 0.1–1.0)
Monocytes Relative: 5 %
Neutro Abs: 10.2 10*3/uL — ABNORMAL HIGH (ref 1.7–7.7)
Neutrophils Relative %: 92 %
Platelets: 202 10*3/uL (ref 150–400)
RBC: 3.84 MIL/uL — ABNORMAL LOW (ref 3.87–5.11)
RDW: 23.3 % — ABNORMAL HIGH (ref 11.5–15.5)
WBC: 11 10*3/uL — ABNORMAL HIGH (ref 4.0–10.5)
nRBC: 0.7 % — ABNORMAL HIGH (ref 0.0–0.2)

## 2021-07-11 LAB — COMPREHENSIVE METABOLIC PANEL
ALT: 519 U/L — ABNORMAL HIGH (ref 0–44)
AST: 138 U/L — ABNORMAL HIGH (ref 15–41)
Albumin: 2.9 g/dL — ABNORMAL LOW (ref 3.5–5.0)
Alkaline Phosphatase: 138 U/L — ABNORMAL HIGH (ref 38–126)
Anion gap: 16 — ABNORMAL HIGH (ref 5–15)
BUN: 79 mg/dL — ABNORMAL HIGH (ref 8–23)
CO2: 18 mmol/L — ABNORMAL LOW (ref 22–32)
Calcium: 8.2 mg/dL — ABNORMAL LOW (ref 8.9–10.3)
Chloride: 101 mmol/L (ref 98–111)
Creatinine, Ser: 2.34 mg/dL — ABNORMAL HIGH (ref 0.44–1.00)
GFR, Estimated: 21 mL/min — ABNORMAL LOW (ref >60)
Glucose, Bld: 187 mg/dL — ABNORMAL HIGH (ref 70–99)
Potassium: 3 mmol/L — ABNORMAL LOW (ref 3.5–5.1)
Sodium: 135 mmol/L (ref 135–145)
Total Bilirubin: 1.5 mg/dL — ABNORMAL HIGH (ref 0.3–1.2)
Total Protein: 7.1 g/dL (ref 6.5–8.1)

## 2021-07-11 LAB — LIPASE, BLOOD: Lipase: 68 U/L — ABNORMAL HIGH (ref 11–51)

## 2021-07-11 LAB — TROPONIN I (HIGH SENSITIVITY)
Troponin I (High Sensitivity): 81 ng/L — ABNORMAL HIGH (ref <18)
Troponin I (High Sensitivity): 71 ng/L — ABNORMAL HIGH (ref <18)
Troponin I (High Sensitivity): 87 ng/L — ABNORMAL HIGH (ref <18)

## 2021-07-11 MED ORDER — FENTANYL CITRATE PF 50 MCG/ML IJ SOSY
25.0000 ug | PREFILLED_SYRINGE | Freq: Once | INTRAMUSCULAR | Status: AC
  Administered 2021-07-11: 25 ug via INTRAVENOUS
  Filled 2021-07-11: qty 1

## 2021-07-11 MED ORDER — POTASSIUM CHLORIDE 10 MEQ/100ML IV SOLN
10.0000 meq | Freq: Once | INTRAVENOUS | Status: AC
  Administered 2021-07-11: 10 meq via INTRAVENOUS
  Filled 2021-07-11: qty 100

## 2021-07-11 MED ORDER — SODIUM CHLORIDE 0.9 % IV BOLUS
500.0000 mL | Freq: Once | INTRAVENOUS | Status: AC
  Administered 2021-07-11: 500 mL via INTRAVENOUS

## 2021-07-11 NOTE — ED Provider Notes (Signed)
23:00: Assumed care of patient from [place] @ shift change pending CT & disposition.  ? ?Please see prior provider note for full H&P.  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 Recent tx for CAP w/ doxycyline 06/29/21 w/ improvement until feeling poorly again today.  ? ?ED work-up reviewed: ?CBC: Mild acidosis, anemia improved from prior ?CMP: Worsening renal function with elevated LFTs and total bilirubin. ?Lipase: Elevated similar to prior ?Troponin: 81, 71, 87 ? ?CT chest/abdomen/pelvis: Focal infiltrate in the right lower lobe likely representing acute pneumonia. Stable cystic lesions within the pancreas. Given their stability from the prior exam from 2021 there felt to be benign in etiology. Multiple isodense and hyperdense lesions are noted within the kidneys bilaterally stable from the prior CT and MRI examination. No further follow-up is recommended. Multiple uterine fibroids. 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 ? ?Patient with chest pain- troponins 81, 71, 87, hx of CAD S/p PCI, no active chest pain, feel this need further trending- admit for chest pain observation- She also has  abnormal renal and liver function testing. Elevated LFTs and total bilirubin, right upper quadrant ultrasound withtiny layering gallstone, without associated inflammatory changes to suggest acute cholecystitis-however no common bile duct dilation noted, CT abdomen pelvis fairly unremarkable in this regard. Query dehydration, hepatorenal syndrome, possible need for MRCP. Will discuss w/ hospitalist for admission.  ? ?01:35: CONSULT: Discussed with hospitalist Dr. Nevada Crane- accepts admission.  ? ?Discussed findings and plan of care with supervising physician Dr. Sedonia Small ? ?Results for orders placed or performed during the hospital encounter  of 07/11/21  ?Comprehensive metabolic panel  ?Result Value Ref Range  ? Sodium 135 135 - 145 mmol/L  ? Potassium 3.0 (L) 3.5 - 5.1 mmol/L  ? Chloride 101 98 - 111 mmol/L  ? CO2 18 (L) 22 - 32 mmol/L  ? Glucose, Bld 187 (H) 70 - 99 mg/dL  ? BUN 79 (H) 8 - 23 mg/dL  ? Creatinine, Ser 2.34 (H) 0.44 - 1.00 mg/dL  ? Calcium 8.2 (L) 8.9 - 10.3 mg/dL  ? Total Protein 7.1 6.5 - 8.1 g/dL  ? Albumin 2.9 (L) 3.5 - 5.0 g/dL  ? AST 138 (H) 15 - 41 U/L  ? ALT 519 (H) 0 - 44 U/L  ? Alkaline Phosphatase 138 (H) 38 - 126 U/L  ? Total Bilirubin 1.5 (H) 0.3 - 1.2 mg/dL  ? GFR, Estimated 21 (L) >60 mL/min  ? Anion gap 16 (H) 5 - 15  ?CBC with Differential  ?Result Value Ref Range  ? WBC 11.0 (H) 4.0 - 10.5 K/uL  ? RBC 3.84 (L) 3.87 - 5.11 MIL/uL  ? Hemoglobin 11.2 (L) 12.0 - 15.0 g/dL  ? HCT 36.8 36.0 - 46.0 %  ? MCV 95.8 80.0 - 100.0 fL  ? MCH 29.2 26.0 - 34.0 pg  ? MCHC 30.4 30.0 - 36.0 g/dL  ? RDW 23.3 (H) 11.5 - 15.5 %  ? Platelets 202 150 - 400 K/uL  ? nRBC 0.7 (H) 0.0 - 0.2 %  ? Neutrophils Relative % 92 %  ? Neutro Abs 10.2 (H) 1.7 - 7.7 K/uL  ? Lymphocytes Relative 2 %  ? Lymphs Abs 0.2 (L) 0.7 - 4.0 K/uL  ? Monocytes Relative 5 %  ? Monocytes Absolute 0.6 0.1 - 1.0 K/uL  ?  Eosinophils Relative 0 %  ? Eosinophils Absolute 0.0 0.0 - 0.5 K/uL  ? Basophils Relative 0 %  ? Basophils Absolute 0.0 0.0 - 0.1 K/uL  ? Immature Granulocytes 1 %  ? Abs Immature Granulocytes 0.07 0.00 - 0.07 K/uL  ?Lipase, blood  ?Result Value Ref Range  ? Lipase 68 (H) 11 - 51 U/L  ?Troponin I (High Sensitivity)  ?Result Value Ref Range  ? Troponin I (High Sensitivity) 81 (H) <18 ng/L  ?Troponin I (High Sensitivity)  ?Result Value Ref Range  ? Troponin I (High Sensitivity) 71 (H) <18 ng/L  ?Troponin I (High Sensitivity)  ?Result Value Ref Range  ? Troponin I (High Sensitivity) 87 (H) <18 ng/L  ? ?CT Abdomen Pelvis Wo Contrast ? ?Result Date: 06/29/2021 ?CLINICAL DATA:  Left-sided flank pain EXAM: CT ABDOMEN AND PELVIS WITHOUT CONTRAST TECHNIQUE:  Multidetector CT imaging of the abdomen and pelvis was performed following the standard protocol without IV contrast. RADIATION DOSE REDUCTION: This exam was performed according to the departmental dose-optimization program which includes automated exposure control, adjustment of the mA and/or kV according to patient size and/or use of iterative reconstruction technique. COMPARISON:  09/19/2019, MRI from 11/11/2019, CT from 02/28/2017 FINDINGS: Lower chest: Small pleural effusions are noted bilaterally. Right lower lobe infiltrate is seen consistent with acute pneumonia. Hepatobiliary: No focal liver abnormality is seen. No gallstones, gallbladder wall thickening, or biliary dilatation. Pancreas: Pancreas again demonstrates multiple cystic lesion the most prominent of which lies in the uncinate process measuring up to 2.7 cm. This is stable when compare with the prior exam. Spleen: Normal in size without focal abnormality. Adrenals/Urinary Tract: Adrenal glands are within normal limits bilaterally. Kidneys are well visualized bilaterally. Multiple hyperdense and isodense lesions are noted which appears stable in appearance from the prior exam. No renal calculi are seen. No obstructive changes noted. The bladder is well distended. Stomach/Bowel: Diverticular change of the colon is noted. No diverticulitis is seen. The appendix is within normal limits. Small bowel and stomach show no acute abnormality. Vascular/Lymphatic: Aortic atherosclerosis. No enlarged abdominal or pelvic lymph nodes. Reproductive: Calcified and noncalcified uterine fibroids are noted stable from previous exams dating back to 2018. No adnexal mass is seen. Other: Fat containing umbilical hernia is noted. No free fluid is seen. Musculoskeletal: Degenerative changes of lumbar spine are noted. Chronic compression deformity at L5 is seen. IMPRESSION: Focal infiltrate in the right lower lobe likely representing acute pneumonia. Stable cystic lesions  within the pancreas. Given their stability from the prior exam from 2021 there felt to be benign in etiology. Multiple isodense and hyperdense lesions are noted within the kidneys bilaterally stable from the prior CT and MRI examination. No further follow-up is recommended. Multiple uterine fibroids. 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 Electronically Signed   By: Inez Catalina M.D.   On: 06/29/2021 23:54  ? ?DG Chest 2 View ? ?Result Date: 07/11/2021 ?CLINICAL DATA:  Chest pain EXAM: CHEST - 2 VIEW COMPARISON:  Radiographs dated June 29, 2021 and CT chest dated January 31, 2021 FINDINGS: Cardiomegaly with atherosclerotic calcification of aortic arch. Postsurgical changes for prior left upper lobectomy with elevation of the left hemidiaphragm. Low lung volumes without evidence of focal consolidation. Chronic deformity of the left rib, consistent with old healed fracture. Multiple surgical clips along the right chest wall. 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 IMPRESSION: 1.  Stable cardiomegaly. 2. Low lung volumes with postsurgical changes of the left lung are unchanged. No evidence of focal consolidation or pleural  effusion. Electronically Signed   By: Keane Police D.O.   On: 07/11/2021 15:39  ? ?DG Ribs Unilateral W/Chest Left ? ?Result Date: 06/29/2021 ?CLINICAL DATA:  Gradual onset, constant, sharp, left-sided rib pain that began last night. History of non-small-cell lung cancer status post left upper lobectomy. EXAM: LEFT RIBS AND CHEST - 3+ VIEW COMPARISON:  Chest two views 05/06/2021, 04/27/2021, and 12/22/2020; CT chest 01/31/2021 FINDINGS: There is again left-sided volume loss. There is homogeneous opacification of the inferior 50% of the left lung which is similar to 12/22/2020 and 05/06/2021 radiographs. There is again high-grade mitral calcification. Cardiac silhouette is at the upper limits of normal size. Moderate calcification seen within the aortic arch. Numerous  right axillary surgical clips. Two left axillary surgical clips. No definite pleural effusion or pneumothorax. Moderate multilevel degenerative disc changes of the thoracic spine. No significant change in angulatio

## 2021-07-11 NOTE — ED Triage Notes (Signed)
Patient coming from home, complain of dull chest pian since 9 this am, 2 nitro, 324 asa via EMS. VSS.  ? ?114/65 ?81 ?99% RA ?Cbg 145 ? ?20g r hand  ? ? ?

## 2021-07-11 NOTE — ED Provider Triage Note (Signed)
Emergency Medicine Provider Triage Evaluation Note ? ?Karen West , a 81 y.o. female  was evaluated in triage.  Pt complains of chest pain.  Chest pain started this morning at 9 AM.  Pain has been constant since then.  Pain is located throughout her entire chest.  Patient is unable describe the pain feels like.  Pain is worse with coughing.  Patient reports that she has been coughing over the last 2 days. ? ?Denies any diaphoresis, nausea, vomiting, shortness of breath, palpitations, leg swelling or tenderness, lightheadedness, syncope, abdominal pain. ? ?Review of Systems  ?Positive: Chest pain, cough ?Negative: See above ? ?Physical Exam  ?BP (!) 116/53 (BP Location: Right Arm)   Pulse 82   Temp (!) 97.5 ?F (36.4 ?C)   Resp 15   SpO2 97%  ?Gen:   Awake, no distress   ?Resp:  Normal effort, Faythe Dingwall to auscultation bilaterally ?MSK:   Moves extremities without difficulty  ?Other:  +2 radial pulse bilaterally.  Abdomen soft, nondistended, nontender no guarding or rebound tenderness. ? ?Medical Decision Making  ?Medically screening exam initiated at 3:14 PM.  Appropriate orders placed.  Karen West was informed that the remainder of the evaluation will be completed by another provider, this initial triage assessment does not replace that evaluation, and the importance of remaining in the ED until their evaluation is complete. ? ?Patient received nitro x2 and 3 to 4 mg aspirin with EMS. ?  ?Loni Beckwith, PA-C ?07/11/21 1516 ? ?

## 2021-07-11 NOTE — ED Provider Notes (Signed)
?[facility] ?Provider Note ? ? ?CSN: [id number] ?Arrival date & time: 07/11/21  1441 ? ?  ? ?History ? ?Chief Complaint  ?Patient presents with  ? Chest Pain  ? ? ?Karen West is a 81 y.o. female with PMHx HTN, HLD, GERD, Diabetes, CAD s/p NSTEMI (February), CKD who presents to the ED today with complaint of gradual onset, constant, sharp, substernal chest pain and abdominal pain that began earlier this morning around 9 AM. Pt reports she was sitting in bed when she began having a cold chill. Her pain started shortly afterwards. She also complains of SOB. Per chart review pt recently in the ED on 04/12 and diagnosed with CAP. Pt reports she finished her abx about 1 week ago and had been doing well until today. She does have a residual cough. Reports some constipation with pelleted stools yesterday. No nausea or vomiting.  ? ?The history is provided by the patient, medical records and a relative.  ? ?  ? ?Home Medications ?Prior to Admission medications   ?Medication Sig Start Date End Date Taking? Authorizing Provider  ?Alirocumab (PRALUENT) 150 MG/ML SOAJ Inject 150 mg into the skin every 14 (fourteen) days. 02/23/20   Minus Breeding, MD  ?aspirin EC 81 MG tablet Take 81 mg by mouth daily. Swallow whole.    [provider]  ?Bempedoic Acid-Ezetimibe (NEXLIZET) 180-10 MG TABS Take 1 tablet by mouth daily.    [provider]  ?cefdinir (OMNICEF) 300 MG capsule Take 1 capsule (300 mg total) by mouth daily. 06/30/21   Quintella Reichert, MD  ?Cholecalciferol (VITAMIN D3) 50 MCG (2000 UT) TABS Take 2,000 Units by mouth daily.    [provider]  ?doxycycline (VIBRAMYCIN) 100 MG capsule Take 1 capsule (100 mg total) by mouth 2 (two) times daily. 06/30/21   Quintella Reichert, MD  ?empagliflozin (JARDIANCE) 10 MG TABS tablet Take 1 tablet (10 mg total) by mouth daily before breakfast. 05/17/21   Clegg, Amy D, NP  ?ferrous sulfate 325 (65 FE) MG tablet Take 325  mg by mouth 2 (two) times daily. 09/27/20   [provider]  ?hydrALAZINE (APRESOLINE) 25 MG tablet Take 1.5 tablets (37.5 mg total) by mouth every 8 (eight) hours. 05/17/21   Clegg, Amy D, NP  ?hydrocortisone cream 1 % Apply 1 application topically 2 (two) times daily as needed for itching.     [provider]  ?insulin degludec (TRESIBA FLEXTOUCH) 100 UNIT/ML FlexTouch Pen 10 units 06/14/21   [provider]  ?isosorbide mononitrate (IMDUR) 60 MG 24 hr tablet Take 1 tablet (60 mg total) by mouth daily. 05/18/21   Clegg, Amy D, NP  ?KLOR-CON M10 10 MEQ tablet Take 10 mEq by mouth daily. 06/01/21   [provider]  ?levothyroxine (SYNTHROID, LEVOTHROID) 88 MCG tablet Take 88 mcg by mouth daily before breakfast.  07/02/17   [provider]  ?linagliptin (TRADJENTA) 5 MG TABS tablet 1 tablet 05/30/21   [provider]  ?metoprolol succinate (TOPROL-XL) 25 MG 24 hr tablet Take 0.5 tablets (12.5 mg total) by mouth at bedtime. 05/17/21   Clegg, Amy D, NP  ?nitroGLYCERIN (NITROSTAT) 0.4 MG SL tablet Place 1 tablet (0.4 mg total) under the tongue every 5 (five) minutes as needed for chest pain. 10/28/19 04/24/22  Deberah Pelton, NP  ?Omega-3 Fatty Acids (FISH OIL) 1200 MG CAPS Take 1,200 mg by mouth 2 (two) times daily.     [provider]  ?omeprazole (H[place]ana)  20 MG capsule Take 20 mg by mouth 2 (two) times daily.     [provider]  ?ticagrelor (BRILINTA) 90 MG TABS tablet Take 1 tablet (90 mg total) by mouth 2 (two) times daily. 06/01/21   Minus Breeding, MD  ?torsemide (DEMADEX) 20 MG tablet Take 1 tablet (20 mg total) by mouth daily. 06/17/21   Rafael Bihari, FNP  ?vitamin B-12 (CYANOCOBALAMIN) 1000 MCG tablet Take 1,000 mcg by mouth daily.    [provider]  ?   ? ?Allergies    ?Tramadol, Lisinopril, Rosuvastatin, and Tapazole [methimazole]   ? ?Review of Systems   ?Review of Systems  ?Constitutional:  Positive for chills. Negative for  fever.  ?Respiratory:  Positive for cough and shortness of breath.   ?Cardiovascular:  Positive for chest pain.  ?Gastrointestinal:  Positive for abdominal pain and constipation. Negative for diarrhea, nausea and vomiting.  ?Genitourinary:  Negative for dysuria, flank pain and frequency.  ?All other systems reviewed and are negative. ? ?Physical Exam ?Updated Vital Signs ?BP 116/71   Pulse 70   Temp 97.8 ?F (36.6 ?C) (Oral)   Resp 18   SpO2 91%  ?Physical Exam ?Vitals and nursing note reviewed.  ?Constitutional:   ?   Appearance: She is not ill-appearing.  ?HENT:  ?   Head: Normocephalic and atraumatic.  ?Eyes:  ?   Conjunctiva/sclera: Conjunctivae normal.  ?Cardiovascular:  ?   Rate and Rhythm: Normal rate and regular rhythm.  ?Pulmonary:  ?   Effort: Pulmonary effort is normal.  ?   Breath sounds: Normal breath sounds. No decreased breath sounds, wheezing, rhonchi or rales.  ?Chest:  ?   Chest wall: Tenderness present.  ?Abdominal:  ?   Palpations: Abdomen is soft.  ?   Tenderness: There is abdominal tenderness. There is no guarding or rebound.  ?   Comments: Soft, diffuse abd TTP, +BS throughout, no r/g/r, neg murphy's, neg mcburney's, no CVA TTP  ?Musculoskeletal:  ?   Cervical back: Neck supple.  ?   Right lower leg: No edema.  ?   Left lower leg: No edema.  ?Skin: ?   General: Skin is warm and dry.  ?Neurological:  ?   Mental Status: She is alert.  ? ? ?ED Results / Procedures / Treatments   ?Labs ?(all labs ordered are listed, but only abnormal results are displayed) ?Labs Reviewed  ?COMPREHENSIVE METABOLIC PANEL - Abnormal; Notable for the following components:  ?    Result Value  ? Potassium 3.0 (*)   ? CO2 18 (*)   ? Glucose, Bld 187 (*)   ? BUN 79 (*)   ? Creatinine, Ser 2.34 (*)   ? Calcium 8.2 (*)   ? Albumin 2.9 (*)   ? AST 138 (*)   ? ALT 519 (*)   ? Alkaline Phosphatase 138 (*)   ? Total Bilirubin 1.5 (*)   ? GFR, Estimated 21 (*)   ? Anion gap 16 (*)   ? All other components within normal limits   ?CBC WITH DIFFERENTIAL/PLATELET - Abnormal; Notable for the following components:  ? WBC 11.0 (*)   ? RBC 3.84 (*)   ? Hemoglobin 11.2 (*)   ? RDW 23.3 (*)   ? nRBC 0.7 (*)   ? Neutro Abs 10.2 (*)   ? Lymphs Abs 0.2 (*)   ? All other components within normal limits  ?LIPASE, BLOOD - Abnormal; Notable for the following components:  ? Lipase 68 (*)   ?  All other components within normal limits  ?TROPONIN I (HIGH SENSITIVITY) - Abnormal; Notable for the following components:  ? Troponin I (High Sensitivity) 81 (*)   ? All other components within normal limits  ?TROPONIN I (HIGH SENSITIVITY) - Abnormal; Notable for the following components:  ? Troponin I (High Sensitivity) 71 (*)   ? All other components within normal limits  ?TROPONIN I (HIGH SENSITIVITY) - Abnormal; Notable for the following components:  ? Troponin I (High Sensitivity) 87 (*)   ? All other components within normal limits  ?URINALYSIS, ROUTINE W REFLEX MICROSCOPIC  ?MAGNESIUM  ?TROPONIN I (HIGH SENSITIVITY)  ? ? ?EKG ?EKG Interpretation ? ?Date/Time:  Monday July 11 2021 14:48:25 EDT ?Ventricular Rate:  86 ?PR Interval:  120 ?QRS Duration: 130 ?QT Interval:  446 ?QTC Calculation: 533 ?R Axis:   173 ?Text Interpretation: Undetermined rhythm Right bundle branch block Lateral infarct , age undetermined Abnormal ECG When compared with ECG of 29-Jun-2021 17:37, PREVIOUS ECG IS PRESENT Confirmed by Dene Gentry [id number]) on 07/11/2021 7:59:24 PM ? ?Radiology ?DG Chest 2 View ? ?Result Date: 07/11/2021 ?CLINICAL DATA:  Chest pain EXAM: CHEST - 2 VIEW COMPARISON:  Radiographs dated June 29, 2021 and CT chest dated January 31, 2021 FINDINGS: Cardiomegaly with atherosclerotic calcification of aortic arch. Postsurgical changes for prior left upper lobectomy with elevation of the left hemidiaphragm. Low lung volumes without evidence of focal consolidation. Chronic deformity of the left rib, consistent with old healed fracture. Multiple surgical clips along the  right chest wall. 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 IMPRESSION: 1.  Stable cardiomegaly. 2. Low lung volumes with postsurgical changes of the left lung are unchanged. No evidence

## 2021-07-12 ENCOUNTER — Other Ambulatory Visit (HOSPITAL_COMMUNITY): Payer: Self-pay

## 2021-07-12 ENCOUNTER — Inpatient Hospital Stay: Payer: Self-pay

## 2021-07-12 ENCOUNTER — Inpatient Hospital Stay (HOSPITAL_COMMUNITY): Payer: Medicare Other

## 2021-07-12 ENCOUNTER — Emergency Department (HOSPITAL_COMMUNITY): Payer: Medicare Other

## 2021-07-12 ENCOUNTER — Encounter (HOSPITAL_COMMUNITY): Payer: Self-pay | Admitting: Student

## 2021-07-12 DIAGNOSIS — E1169 Type 2 diabetes mellitus with other specified complication: Secondary | ICD-10-CM | POA: Diagnosis present

## 2021-07-12 DIAGNOSIS — I5022 Chronic systolic (congestive) heart failure: Secondary | ICD-10-CM | POA: Diagnosis not present

## 2021-07-12 DIAGNOSIS — E118 Type 2 diabetes mellitus with unspecified complications: Secondary | ICD-10-CM | POA: Diagnosis not present

## 2021-07-12 DIAGNOSIS — I5023 Acute on chronic systolic (congestive) heart failure: Secondary | ICD-10-CM | POA: Diagnosis present

## 2021-07-12 DIAGNOSIS — N179 Acute kidney failure, unspecified: Secondary | ICD-10-CM | POA: Diagnosis present

## 2021-07-12 DIAGNOSIS — R079 Chest pain, unspecified: Secondary | ICD-10-CM

## 2021-07-12 DIAGNOSIS — N184 Chronic kidney disease, stage 4 (severe): Secondary | ICD-10-CM | POA: Diagnosis present

## 2021-07-12 DIAGNOSIS — Z66 Do not resuscitate: Secondary | ICD-10-CM | POA: Diagnosis not present

## 2021-07-12 DIAGNOSIS — R918 Other nonspecific abnormal finding of lung field: Secondary | ICD-10-CM | POA: Diagnosis not present

## 2021-07-12 DIAGNOSIS — K761 Chronic passive congestion of liver: Secondary | ICD-10-CM | POA: Diagnosis present

## 2021-07-12 DIAGNOSIS — Z7189 Other specified counseling: Secondary | ICD-10-CM | POA: Diagnosis not present

## 2021-07-12 DIAGNOSIS — R7989 Other specified abnormal findings of blood chemistry: Secondary | ICD-10-CM | POA: Diagnosis not present

## 2021-07-12 DIAGNOSIS — I4891 Unspecified atrial fibrillation: Secondary | ICD-10-CM | POA: Diagnosis not present

## 2021-07-12 DIAGNOSIS — I5043 Acute on chronic combined systolic (congestive) and diastolic (congestive) heart failure: Secondary | ICD-10-CM

## 2021-07-12 DIAGNOSIS — I083 Combined rheumatic disorders of mitral, aortic and tricuspid valves: Secondary | ICD-10-CM | POA: Diagnosis present

## 2021-07-12 DIAGNOSIS — R072 Precordial pain: Secondary | ICD-10-CM | POA: Diagnosis present

## 2021-07-12 DIAGNOSIS — E876 Hypokalemia: Secondary | ICD-10-CM | POA: Diagnosis present

## 2021-07-12 DIAGNOSIS — I340 Nonrheumatic mitral (valve) insufficiency: Secondary | ICD-10-CM | POA: Diagnosis not present

## 2021-07-12 DIAGNOSIS — K8020 Calculus of gallbladder without cholecystitis without obstruction: Secondary | ICD-10-CM | POA: Diagnosis not present

## 2021-07-12 DIAGNOSIS — I10 Essential (primary) hypertension: Secondary | ICD-10-CM | POA: Diagnosis not present

## 2021-07-12 DIAGNOSIS — I5084 End stage heart failure: Secondary | ICD-10-CM | POA: Diagnosis present

## 2021-07-12 DIAGNOSIS — E872 Acidosis, unspecified: Secondary | ICD-10-CM | POA: Diagnosis present

## 2021-07-12 DIAGNOSIS — E1122 Type 2 diabetes mellitus with diabetic chronic kidney disease: Secondary | ICD-10-CM | POA: Diagnosis present

## 2021-07-12 DIAGNOSIS — Z515 Encounter for palliative care: Secondary | ICD-10-CM | POA: Diagnosis not present

## 2021-07-12 DIAGNOSIS — K5730 Diverticulosis of large intestine without perforation or abscess without bleeding: Secondary | ICD-10-CM | POA: Diagnosis not present

## 2021-07-12 DIAGNOSIS — D631 Anemia in chronic kidney disease: Secondary | ICD-10-CM | POA: Diagnosis present

## 2021-07-12 DIAGNOSIS — E785 Hyperlipidemia, unspecified: Secondary | ICD-10-CM | POA: Diagnosis present

## 2021-07-12 DIAGNOSIS — I130 Hypertensive heart and chronic kidney disease with heart failure and stage 1 through stage 4 chronic kidney disease, or unspecified chronic kidney disease: Secondary | ICD-10-CM | POA: Diagnosis present

## 2021-07-12 DIAGNOSIS — M109 Gout, unspecified: Secondary | ICD-10-CM | POA: Diagnosis present

## 2021-07-12 DIAGNOSIS — E871 Hypo-osmolality and hyponatremia: Secondary | ICD-10-CM | POA: Diagnosis present

## 2021-07-12 DIAGNOSIS — M7989 Other specified soft tissue disorders: Secondary | ICD-10-CM

## 2021-07-12 DIAGNOSIS — I5020 Unspecified systolic (congestive) heart failure: Secondary | ICD-10-CM | POA: Diagnosis not present

## 2021-07-12 DIAGNOSIS — R945 Abnormal results of liver function studies: Secondary | ICD-10-CM | POA: Diagnosis not present

## 2021-07-12 DIAGNOSIS — I700 Atherosclerosis of aorta: Secondary | ICD-10-CM | POA: Diagnosis present

## 2021-07-12 DIAGNOSIS — G4733 Obstructive sleep apnea (adult) (pediatric): Secondary | ICD-10-CM | POA: Diagnosis present

## 2021-07-12 DIAGNOSIS — I82442 Acute embolism and thrombosis of left tibial vein: Secondary | ICD-10-CM | POA: Diagnosis present

## 2021-07-12 DIAGNOSIS — E1165 Type 2 diabetes mellitus with hyperglycemia: Secondary | ICD-10-CM | POA: Diagnosis present

## 2021-07-12 DIAGNOSIS — E890 Postprocedural hypothyroidism: Secondary | ICD-10-CM | POA: Diagnosis present

## 2021-07-12 DIAGNOSIS — R579 Shock, unspecified: Secondary | ICD-10-CM | POA: Diagnosis present

## 2021-07-12 LAB — URINALYSIS, ROUTINE W REFLEX MICROSCOPIC
Bilirubin Urine: NEGATIVE
Glucose, UA: >=500 mg/dL — AB
Hgb urine dipstick: NEGATIVE
Ketones, ur: NEGATIVE mg/dL
Leukocytes,Ua: NEGATIVE
Nitrite: NEGATIVE
Protein, ur: NEGATIVE mg/dL
Specific Gravity, Urine: 1.009 (ref 1.005–1.030)
pH: 5 (ref 5.0–8.0)

## 2021-07-12 LAB — CBC WITH DIFFERENTIAL/PLATELET
Abs Immature Granulocytes: 0.09 10*3/uL — ABNORMAL HIGH (ref 0.00–0.07)
Basophils Absolute: 0 10*3/uL (ref 0.0–0.1)
Basophils Relative: 0 %
Eosinophils Absolute: 0 10*3/uL (ref 0.0–0.5)
Eosinophils Relative: 0 %
HCT: 32.6 % — ABNORMAL LOW (ref 36.0–46.0)
Hemoglobin: 10.3 g/dL — ABNORMAL LOW (ref 12.0–15.0)
Immature Granulocytes: 1 %
Lymphocytes Relative: 6 %
Lymphs Abs: 0.7 10*3/uL (ref 0.7–4.0)
MCH: 29.3 pg (ref 26.0–34.0)
MCHC: 31.6 g/dL (ref 30.0–36.0)
MCV: 92.9 fL (ref 80.0–100.0)
Monocytes Absolute: 1 10*3/uL (ref 0.1–1.0)
Monocytes Relative: 9 %
Neutro Abs: 9.8 10*3/uL — ABNORMAL HIGH (ref 1.7–7.7)
Neutrophils Relative %: 84 %
Platelets: 178 10*3/uL (ref 150–400)
RBC: 3.51 MIL/uL — ABNORMAL LOW (ref 3.87–5.11)
RDW: 23.2 % — ABNORMAL HIGH (ref 11.5–15.5)
Smear Review: NORMAL
WBC: 11.5 10*3/uL — ABNORMAL HIGH (ref 4.0–10.5)
nRBC: 0.6 % — ABNORMAL HIGH (ref 0.0–0.2)

## 2021-07-12 LAB — BASIC METABOLIC PANEL
Anion gap: 14 (ref 5–15)
BUN: 72 mg/dL — ABNORMAL HIGH (ref 8–23)
CO2: 17 mmol/L — ABNORMAL LOW (ref 22–32)
Calcium: 8.1 mg/dL — ABNORMAL LOW (ref 8.9–10.3)
Chloride: 106 mmol/L (ref 98–111)
Creatinine, Ser: 2.08 mg/dL — ABNORMAL HIGH (ref 0.44–1.00)
GFR, Estimated: 24 mL/min — ABNORMAL LOW (ref >60)
Glucose, Bld: 128 mg/dL — ABNORMAL HIGH (ref 70–99)
Potassium: 3.3 mmol/L — ABNORMAL LOW (ref 3.5–5.1)
Sodium: 137 mmol/L (ref 135–145)

## 2021-07-12 LAB — GLUCOSE, CAPILLARY
Glucose-Capillary: 114 mg/dL — ABNORMAL HIGH (ref 70–99)
Glucose-Capillary: 145 mg/dL — ABNORMAL HIGH (ref 70–99)
Glucose-Capillary: 176 mg/dL — ABNORMAL HIGH (ref 70–99)
Glucose-Capillary: 187 mg/dL — ABNORMAL HIGH (ref 70–99)

## 2021-07-12 LAB — COMPREHENSIVE METABOLIC PANEL
ALT: 413 U/L — ABNORMAL HIGH (ref 0–44)
AST: 92 U/L — ABNORMAL HIGH (ref 15–41)
Albumin: 2.6 g/dL — ABNORMAL LOW (ref 3.5–5.0)
Alkaline Phosphatase: 121 U/L (ref 38–126)
Anion gap: 12 (ref 5–15)
BUN: 73 mg/dL — ABNORMAL HIGH (ref 8–23)
CO2: 19 mmol/L — ABNORMAL LOW (ref 22–32)
Calcium: 8.1 mg/dL — ABNORMAL LOW (ref 8.9–10.3)
Chloride: 107 mmol/L (ref 98–111)
Creatinine, Ser: 2.07 mg/dL — ABNORMAL HIGH (ref 0.44–1.00)
GFR, Estimated: 24 mL/min — ABNORMAL LOW (ref >60)
Glucose, Bld: 108 mg/dL — ABNORMAL HIGH (ref 70–99)
Potassium: 2.8 mmol/L — ABNORMAL LOW (ref 3.5–5.1)
Sodium: 138 mmol/L (ref 135–145)
Total Bilirubin: 1.5 mg/dL — ABNORMAL HIGH (ref 0.3–1.2)
Total Protein: 6.7 g/dL (ref 6.5–8.1)

## 2021-07-12 LAB — TROPONIN I (HIGH SENSITIVITY): Troponin I (High Sensitivity): 60 ng/L — ABNORMAL HIGH (ref <18)

## 2021-07-12 LAB — LIPASE, BLOOD: Lipase: 59 U/L — ABNORMAL HIGH (ref 11–51)

## 2021-07-12 LAB — PHOSPHORUS: Phosphorus: 5.2 mg/dL — ABNORMAL HIGH (ref 2.5–4.6)

## 2021-07-12 LAB — MAGNESIUM
Magnesium: 2.7 mg/dL — ABNORMAL HIGH (ref 1.7–2.4)
Magnesium: 1.9 mg/dL (ref 1.7–2.4)

## 2021-07-12 MED ORDER — SODIUM CHLORIDE 0.9% FLUSH
10.0000 mL | Freq: Two times a day (BID) | INTRAVENOUS | Status: DC
  Administered 2021-07-12 – 2021-07-20 (×7): 10 mL

## 2021-07-12 MED ORDER — MELATONIN 3 MG PO TABS
3.0000 mg | ORAL_TABLET | Freq: Every evening | ORAL | Status: DC | PRN
  Administered 2021-07-14: 3 mg via ORAL
  Filled 2021-07-12 (×2): qty 1

## 2021-07-12 MED ORDER — ENOXAPARIN SODIUM 30 MG/0.3ML IJ SOSY
30.0000 mg | PREFILLED_SYRINGE | Freq: Every day | INTRAMUSCULAR | Status: DC
Start: 2021-07-12 — End: 2021-07-12
  Administered 2021-07-12: 30 mg via SUBCUTANEOUS
  Filled 2021-07-12: qty 0.3

## 2021-07-12 MED ORDER — INSULIN ASPART 100 UNIT/ML IJ SOLN
0.0000 [IU] | Freq: Every day | INTRAMUSCULAR | Status: DC
  Administered 2021-07-13: 4 [IU] via SUBCUTANEOUS
  Administered 2021-07-16 – 2021-07-18 (×2): 2 [IU] via SUBCUTANEOUS
  Administered 2021-07-19: 4 [IU] via SUBCUTANEOUS

## 2021-07-12 MED ORDER — INSULIN ASPART 100 UNIT/ML IJ SOLN
0.0000 [IU] | Freq: Three times a day (TID) | INTRAMUSCULAR | Status: DC
  Administered 2021-07-12: 2 [IU] via SUBCUTANEOUS
  Administered 2021-07-12: 1 [IU] via SUBCUTANEOUS
  Administered 2021-07-13: 2 [IU] via SUBCUTANEOUS
  Administered 2021-07-13: 7 [IU] via SUBCUTANEOUS
  Administered 2021-07-14: 2 [IU] via SUBCUTANEOUS
  Administered 2021-07-14: 3 [IU] via SUBCUTANEOUS
  Administered 2021-07-14: 2 [IU] via SUBCUTANEOUS
  Administered 2021-07-15: 3 [IU] via SUBCUTANEOUS
  Administered 2021-07-15 – 2021-07-18 (×9): 2 [IU] via SUBCUTANEOUS
  Administered 2021-07-18: 1 [IU] via SUBCUTANEOUS
  Administered 2021-07-18: 9 [IU] via SUBCUTANEOUS
  Administered 2021-07-19: 2 [IU] via SUBCUTANEOUS
  Administered 2021-07-19: 9 [IU] via SUBCUTANEOUS

## 2021-07-12 MED ORDER — ENOXAPARIN SODIUM 30 MG/0.3ML IJ SOSY: 30.0000 mg | PREFILLED_SYRINGE | INTRAMUSCULAR | Status: DC

## 2021-07-12 MED ORDER — CHLORHEXIDINE GLUCONATE CLOTH 2 % EX PADS
6.0000 | MEDICATED_PAD | Freq: Every day | CUTANEOUS | Status: DC
  Administered 2021-07-13 – 2021-07-20 (×8): 6 via TOPICAL

## 2021-07-12 MED ORDER — TICAGRELOR 90 MG PO TABS
90.0000 mg | ORAL_TABLET | Freq: Two times a day (BID) | ORAL | Status: DC
Start: 2021-07-12 — End: 2021-07-20
  Administered 2021-07-12 – 2021-07-20 (×17): 90 mg via ORAL
  Filled 2021-07-12 (×17): qty 1

## 2021-07-12 MED ORDER — MILRINONE LACTATE IN DEXTROSE 20-5 MG/100ML-% IV SOLN
0.2500 ug/kg/min | INTRAVENOUS | Status: DC
  Administered 2021-07-12: 0.25 ug/kg/min via INTRAVENOUS
  Filled 2021-07-12: qty 100

## 2021-07-12 MED ORDER — OXYCODONE HCL 5 MG PO TABS: 5.0000 mg | ORAL_TABLET | Freq: Four times a day (QID) | ORAL | Status: DC | PRN

## 2021-07-12 MED ORDER — HEPARIN BOLUS VIA INFUSION
2000.0000 [IU] | Freq: Once | INTRAVENOUS | Status: AC
  Administered 2021-07-12: 2000 [IU] via INTRAVENOUS
  Filled 2021-07-12: qty 2000

## 2021-07-12 MED ORDER — HYDROMORPHONE HCL 1 MG/ML IJ SOLN
0.5000 mg | INTRAMUSCULAR | Status: DC | PRN
  Administered 2021-07-12: 0.5 mg via INTRAVENOUS
  Filled 2021-07-12: qty 1

## 2021-07-12 MED ORDER — SODIUM CHLORIDE 0.9% FLUSH: 10.0000 mL | INTRAVENOUS | Status: DC | PRN

## 2021-07-12 MED ORDER — HEPARIN (PORCINE) 25000 UT/250ML-% IV SOLN
900.0000 [IU]/h | INTRAVENOUS | Status: DC
Start: 2021-07-12 — End: 2021-07-19
  Administered 2021-07-12: 1100 [IU]/h via INTRAVENOUS
  Administered 2021-07-14 – 2021-07-19 (×5): 900 [IU]/h via INTRAVENOUS
  Filled 2021-07-12 (×7): qty 250

## 2021-07-12 MED ORDER — SENNOSIDES-DOCUSATE SODIUM 8.6-50 MG PO TABS
2.0000 | ORAL_TABLET | Freq: Every day | ORAL | Status: DC
  Administered 2021-07-12 – 2021-07-13 (×2): 2 via ORAL
  Filled 2021-07-12 (×4): qty 2

## 2021-07-12 MED ORDER — ASPIRIN EC 81 MG PO TBEC
81.0000 mg | DELAYED_RELEASE_TABLET | Freq: Every day | ORAL | Status: DC
  Administered 2021-07-12 – 2021-07-20 (×9): 81 mg via ORAL
  Filled 2021-07-12 (×9): qty 1

## 2021-07-12 MED ORDER — BEMPEDOIC ACID-EZETIMIBE 180-10 MG PO TABS: 1.0000 | ORAL_TABLET | Freq: Every day | ORAL | Status: DC

## 2021-07-12 MED ORDER — MAGNESIUM SULFATE IN D5W 1-5 GM/100ML-% IV SOLN
1.0000 g | Freq: Once | INTRAVENOUS | Status: AC
  Administered 2021-07-12: 1 g via INTRAVENOUS
  Filled 2021-07-12: qty 100

## 2021-07-12 MED ORDER — POLYETHYLENE GLYCOL 3350 17 G PO PACK
17.0000 g | PACK | Freq: Every day | ORAL | Status: DC | PRN
  Administered 2021-07-20: 17 g via ORAL
  Filled 2021-07-12: qty 1

## 2021-07-12 MED ORDER — FUROSEMIDE 10 MG/ML IJ SOLN
80.0000 mg | Freq: Two times a day (BID) | INTRAMUSCULAR | Status: DC
  Administered 2021-07-12 – 2021-07-18 (×13): 80 mg via INTRAVENOUS
  Filled 2021-07-12 (×13): qty 8

## 2021-07-12 MED ORDER — POTASSIUM CHLORIDE CRYS ER 20 MEQ PO TBCR
40.0000 meq | EXTENDED_RELEASE_TABLET | Freq: Two times a day (BID) | ORAL | Status: AC
  Administered 2021-07-12 – 2021-07-13 (×4): 40 meq via ORAL
  Filled 2021-07-12 (×4): qty 2

## 2021-07-12 MED ORDER — SODIUM BICARBONATE 650 MG PO TABS
1300.0000 mg | ORAL_TABLET | Freq: Once | ORAL | Status: AC
  Administered 2021-07-12: 1300 mg via ORAL
  Filled 2021-07-12: qty 2

## 2021-07-12 MED ORDER — POTASSIUM CHLORIDE CRYS ER 20 MEQ PO TBCR
20.0000 meq | EXTENDED_RELEASE_TABLET | Freq: Once | ORAL | Status: AC
  Administered 2021-07-12: 20 meq via ORAL
  Filled 2021-07-12: qty 1

## 2021-07-12 MED ORDER — POTASSIUM CHLORIDE CRYS ER 20 MEQ PO TBCR
20.0000 meq | EXTENDED_RELEASE_TABLET | Freq: Once | ORAL | Status: DC
Start: 2021-07-12 — End: 2021-07-12

## 2021-07-12 MED ORDER — PROCHLORPERAZINE EDISYLATE 10 MG/2ML IJ SOLN
10.0000 mg | Freq: Four times a day (QID) | INTRAMUSCULAR | Status: DC | PRN
  Administered 2021-07-12: 10 mg via INTRAVENOUS
  Filled 2021-07-12: qty 2

## 2021-07-12 NOTE — Progress Notes (Signed)
TR[facility] night cross cover note: ? ?I was notified by RN that the patient is showing evidence of increased frequency of PVCs on telemetry, and that this has correlated with an additional episode of her presenting chest pain, which resolved with dose of IV Dilaudid.  ? ?Repeat EKG showed sinus rhythm with multiple PVCs, heart rate 83, right bundle branch block, T wave inversion in leads III and V1, unchanged from presenting EKG, no evidence of interval ST changes.  ? ?Review of updated morning labs, include serum potassium level of 2.8, down from 3.0 after interval 10 mill equivalents of IV potassium administration, while serum magnesium level is noted to be 1.9 this morning relative to presenting value of 2.7.  The patient has already received 20 mEq of oral potassium chloride this morning.  As her renal function is improving, I have ordered an additional 20 mill equivalents of p.o. potassium to try to provide some electrolyte optimization as relates to her increasing frequency of PVCs.  Consistent with this, I also ordered 1 g of IV magnesium sulfate.  Repeat BMP ordered for 10 AM this morning.  Of note, the patient's presenting troponin has subsequently trended down after peaking in the 80s.  Just received her home dual antiplatelet therapy with aspirin and ticagrelor.  Other vital signs stable at this time. ? ? ? ?Babs Bertin, DO ?Hospitalist ? ? ? ?

## 2021-07-12 NOTE — ED Notes (Signed)
Breakfast order placed ?

## 2021-07-12 NOTE — Progress Notes (Signed)
ReDS Vest / Clip - 07/12/21 1551   ? ?  ? ReDS Vest / Clip  ? Station Marker A   ? Ruler Value 31   ? ReDS Value Range Moderate volume overload   ? ReDS Actual Value 40   ? ?  ?  ? ?  ? ? ?

## 2021-07-12 NOTE — H&P (Addendum)
History and Physical  Karen West ACZ:[id number] DOB: 01/21/1941 DOA: 07/11/2021  Referring physician: Dr. Micheal Likens, EDP  PCP: Merri Brunette, MD  Outpatient Specialists: Cardiology. Patient coming from: Home via EMS  Chief Complaint: Chest pain, shortness of breath, abdominal pain, chills  HPI: Karen West is a 81 y.o. 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  Associated with poor oral intake.  Onset hours prior to presentation.  The patient is a poor historian, does not provide much details.  Patient received a full dose of aspirin via EMS.  In the ED, work-up revealed elevated troponin, elevated LFTs, elevated creatinine.  Due to concern for hepatorenal syndrome, EDP requested admission.  Admitted by the hospitalist service, TRH.  ED Course: Tmax 97.8.  BP 105/94, pulse 81, respiratory 30, saturation 93% on room air.  Lab studies remarkable for WBC 11,000, hemoglobin 11.2, at baseline.  Potassium 3.0, serum bicarb 18, glucose 187, BUN 79, creatinine 2.34 with GFR of 21.  Anion gap 16.  Alkaline phosphatase 138, AST 138, ALT 519, T bilirubin 1.5.  Review of Systems: Review of systems as noted in the HPI. All other systems reviewed and are negative.   Past Medical History:  Diagnosis Date   Atherosclerosis of aorta (HCC)    a. 01/2017/03/2017 - noted on high res chest CTs.   Breast cancer (HCC)    a. Bilateral --> s/p left mastectomy   Carotid artery disease (HCC)    a. 06/2011 w/ 50-69% LICA stenosis and <50% RICA stenosis; b. 10/2015 Carotid U/S: < 50% BICA stenosis   Chest pain    a.  09/2011 MV: EF 68%, no ischemia/infarct.   Chronic anemia    Chronic headaches    denies   Coronary artery calcification seen on CT scan    a. 01/2017 High res CT: atherosclerotic calcification of the arterial vascularture, including severe involvement of the coronary arteries; b. 03/2017 CT Chest: coronary and Ao atheroscelrosis.   GERD (gastroesophageal reflux disease)    History of echocardiogram    a. 09/2011 Echo: EF 55-60%, no rwma, triv AI, PASP .   Hyperlipidemia    Hypertension    Hyperthyroidism    Left upper lobe pulmonary nodule    a. 02/2017 PET: slowing enlarging 1.7cm LUL nodule w/ low-grade metabolic activity; b. 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 05/2017 s/p VATS.   Multinodular goiter    a. 02/2017 PET scan- Hypermetabolic nodule;  b. 04/2017 s/p thyroidectomy   Myocardial infarction (HCC) 06/2019   NONSTEMI   Obesity    OSA on CPAP    cpap   Osteoarthritis    Personal history of radiation therapy 1999   Right bundle branch block    Type II or unspecified type diabetes mellitus without mention of complication, uncontrolled    Past Surgical History:  Procedure Laterality Date   BREAST BIOPSY  1993; 1995; 2000   left; right; left   BREAST EXCISIONAL BIOPSY Right pt unsure   CARDIAC CATHETERIZATION     CORONARY STENT INTERVENTION N/A 07/03/2019   Procedure: CORONARY STENT INTERVENTION;  Surgeon: Lennette Bihari, MD;  Location: [facility] INVASIVE CV LAB;  Service: Cardiovascular;  Laterality: N/A;  RCA   CORONARY/GRAFT ACUTE MI REVASCULARIZATION N/A 04/23/2021   Procedure: Coronary/Graft Acute MI Revascularization;  Surgeon: Lennette Bihari, MD;  Location: [facility] INVASIVE CV LAB;  Service: Cardiovascular;  Laterality: N/A;   ESOPHAGOGASTRODUODENOSCOPY (EGD) WITH PROPOFOL N/A 07/16/2020   Procedure: ESOPHAGOGASTRODUODENOSCOPY (EGD) WITH PROPOFOL;  Surgeon: Jeani Hawking, MD;  Location: [facility] ENDOSCOPY;  Service: Endoscopy;  Laterality: N/A;   FINE NEEDLE ASPIRATION N/A  07/16/2020   Procedure: FINE NEEDLE ASPIRATION (FNA) LINEAR;  Surgeon: Jeani Hawking, MD;  Location: [facility] ENDOSCOPY;  Service: Endoscopy;  Laterality: N/A;   LEFT HEART CATH AND CORONARY ANGIOGRAPHY N/A 07/03/2019   Procedure: LEFT HEART CATH AND CORONARY ANGIOGRAPHY;  Surgeon: Lennette Bihari, MD;  Location: [facility] INVASIVE CV LAB;  Service: Cardiovascular;  Laterality: N/A;   LEFT HEART CATH AND CORONARY ANGIOGRAPHY N/A 04/23/2021   Procedure: LEFT HEART CATH AND CORONARY ANGIOGRAPHY;  Surgeon: Lennette Bihari, MD;  Location: [facility] INVASIVE CV LAB;  Service: Cardiovascular;  Laterality: N/A;   MASTECTOMY Left 2000   left   RIGHT HEART CATH N/A 05/13/2021   Procedure: RIGHT HEART CATH;  Surgeon: Dolores Patty, MD;  Location: [facility] INVASIVE CV LAB;  Service: Cardiovascular;  Laterality: N/A;   TEE WITHOUT CARDIOVERSION N/A 05/02/2021   Procedure: TRANSESOPHAGEAL ECHOCARDIOGRAM (TEE);  Surgeon: Chilton Si, MD;  Location: [facility] ENDOSCOPY;  Service: Cardiovascular;  Laterality: N/A;   TEE WITHOUT CARDIOVERSION N/A 05/16/2021   Procedure: TRANSESOPHAGEAL ECHOCARDIOGRAM (TEE);  Surgeon: Christell Constant, MD;  Location: [facility] ENDOSCOPY;  Service: Cardiovascular;  Laterality: N/A;   THYROIDECTOMY  05/10/2017   VIDEO BRONCHOSCOPY WITH ENDOBRONCHIAL NAVIGATION (N/A)   THYROIDECTOMY N/A 05/10/2017   Procedure: TOTAL THYROIDECTOMY;  Surgeon: Darnell Level, MD;  Location: [facility] OR;  Service: General;  Laterality: N/A;   UPPER ESOPHAGEAL ENDOSCOPIC ULTRASOUND (EUS) N/A 07/16/2020   Procedure: UPPER ESOPHAGEAL ENDOSCOPIC ULTRASOUND (EUS);  Surgeon: Jeani Hawking, MD;  Location: [facility] ENDOSCOPY;  Service: Endoscopy;  Laterality: N/A;   VIDEO ASSISTED THORACOSCOPY (VATS)/ LOBECTOMY Left 06/04/2017   Procedure: VIDEO ASSISTED THORACOSCOPY (VATS)/LEFT UPPER LOBECTOMY;  Surgeon: Loreli Slot, MD;  Location: [facility] OR;  Service: Thoracic;  Laterality: Left;   VIDEO BRONCHOSCOPY WITH ENDOBRONCHIAL NAVIGATION N/A 05/10/2017    Procedure: VIDEO BRONCHOSCOPY WITH ENDOBRONCHIAL NAVIGATION;  Surgeon: Loreli Slot, MD;  Location: [facility] OR;  Service: Thoracic;  Laterality: N/A;   VIDEO BRONCHOSCOPY WITH ENDOBRONCHIAL ULTRASOUND N/A 06/04/2017   Procedure: VIDEO BRONCHOSCOPY WITH ENDOBRONCHIAL ULTRASOUND;  Surgeon: Loreli Slot, MD;  Location: [facility] OR;  Service: Thoracic;  Laterality: N/A;    Social History:  reports that she has never smoked. She has never used smokeless tobacco. She reports current alcohol use. She reports that she does not use drugs.   Allergies  Allergen Reactions   Tramadol Nausea And Vomiting and Other (See Comments)    "got sick to my stomach and was throwing up blood; it put me in the hospital")   Lisinopril Rash and Other (See Comments)    Renal failure    Rosuvastatin Other (See Comments)    Other reaction(s): rhabdo   Tapazole [Methimazole] Itching and Rash    Family History  Problem Relation Age of Onset   Diabetes Brother        x3   Hypertension Brother        x3   Diabetes Brother    Stroke Brother    Heart attack Mother 25       Mother Died of MI age  30   Diabetes Sister    Stroke Sister       Prior to Admission medications   Medication Sig Start Date End Date Taking? Authorizing Provider  Alirocumab (PRALUENT) 150 MG/ML SOAJ Inject 150 mg into the skin every 14 (fourteen) days. 02/23/20   Rollene Rotunda, MD  aspirin EC 81 MG tablet Take 81 mg by mouth daily. Swallow whole.    [provider]  Bempedoic Acid-Ezetimibe (NEXLIZET) 180-10 MG TABS Take 1 tablet by mouth daily.    [provider]  cefdinir (OMNICEF) 300 MG capsule Take 1 capsule (300 mg total) by mouth daily. 06/30/21   Tilden Fossa, MD  Cholecalciferol (VITAMIN D3) 50 MCG (2000 UT) TABS Take 2,000 Units by mouth daily.    [provider]  doxycycline (VIBRAMYCIN) 100 MG capsule Take 1 capsule (100 mg total) by mouth 2 (two) times daily. 06/30/21   Tilden Fossa, MD   empagliflozin (JARDIANCE) 10 MG TABS tablet Take 1 tablet (10 mg total) by mouth daily before breakfast. 05/17/21   Clegg, Amy D, NP  ferrous sulfate 325 (65 FE) MG tablet Take 325 mg by mouth 2 (two) times daily. 09/27/20   [provider]  hydrALAZINE (APRESOLINE) 25 MG tablet Take 1.5 tablets (37.5 mg total) by mouth every 8 (eight) hours. 05/17/21   Clegg, Amy D, NP  hydrocortisone cream 1 % Apply 1 application topically 2 (two) times daily as needed for itching.     [provider]  insulin degludec (TRESIBA FLEXTOUCH) 100 UNIT/ML FlexTouch Pen 10 units 06/14/21   [provider]  isosorbide mononitrate (IMDUR) 60 MG 24 hr tablet Take 1 tablet (60 mg total) by mouth daily. 05/18/21   Clegg, Amy D, NP  KLOR-CON M10 10 MEQ tablet Take 10 mEq by mouth daily. 06/01/21   [provider]  levothyroxine (SYNTHROID, LEVOTHROID) 88 MCG tablet Take 88 mcg by mouth daily before breakfast.  07/02/17   [provider]  linagliptin (TRADJENTA) 5 MG TABS tablet 1 tablet 05/30/21   [provider]  metoprolol succinate (TOPROL-XL) 25 MG 24 hr tablet Take 0.5 tablets (12.5 mg total) by mouth at bedtime. 05/17/21   Clegg, Amy D, NP  nitroGLYCERIN (NITROSTAT) 0.4 MG SL tablet Place 1 tablet (0.4 mg total) under the tongue every 5 (five) minutes as needed for chest pain. 10/28/19 04/24/22  Ronney Asters, NP  Omega-3 Fatty Acids (FISH OIL) 1200 MG CAPS Take 1,200 mg by mouth 2 (two) times daily.     [provider]  omeprazole (PRILOSEC) 20 MG capsule Take 20 mg by mouth 2 (two) times daily.     [provider]  ticagrelor (BRILINTA) 90 MG TABS tablet Take 1 tablet (90 mg total) by mouth 2 (two) times daily. 06/01/21   Rollene Rotunda, MD  torsemide (DEMADEX) 20 MG tablet Take 1 tablet (20 mg total) by mouth daily. 06/17/21   Milford, Anderson Malta, FNP  vitamin B-12 (CYANOCOBALAMIN) 1000 MCG tablet Take 1,000 mcg by mouth daily.    [provider]     Physical Exam: BP 113/80   Pulse 81   Temp 97.8 F (36.6 C) (Oral)   Resp (!) 28   SpO2 95%   General: 81 y.o. year-old female well developed well nourished in no acute distress.  Alert and oriented x3. Cardiovascular: Regular rate and rhythm with no rubs or gallops.  No thyromegaly or JVD noted.  1+ pitting edema in left lower extremity. Respiratory: Clear to  auscultation with no wheezes or rales. Good inspiratory effort. Abdomen: Soft tenderness in the epigastric and right upper quadrant.  Nondistended with normal bowel sounds x4 quadrants. Muskuloskeletal: No cyanosis or clubbing.  Left lower extremity 1+ pitting edema. Neuro: CN II-XII intact, strength, sensation, reflexes Skin: No ulcerative lesions noted or rashes Psychiatry: Judgement and insight appear normal. Mood is appropriate for condition and setting          Labs on Admission:  Basic Metabolic Panel: Recent Labs  Lab 07/11/21 1520 07/12/21 0050  NA 135  --   K 3.0*  --   CL 101  --   CO2 18*  --   GLUCOSE 187*  --   BUN 79*  --   CREATININE 2.34*  --   CALCIUM 8.2*  --   MG  --  2.7*   Liver Function Tests: Recent Labs  Lab 07/11/21 1520  AST 138*  ALT 519*  ALKPHOS 138*  BILITOT 1.5*  PROT 7.1  ALBUMIN 2.9*   Recent Labs  Lab 07/11/21 2035  LIPASE 68*   No results for input(s): AMMONIA in the last 168 hours. CBC: Recent Labs  Lab 07/11/21 1520  WBC 11.0*  NEUTROABS 10.2*  HGB 11.2*  HCT 36.8  MCV 95.8  PLT 202   Cardiac Enzymes: No results for input(s): CKTOTAL, CKMB, CKMBINDEX, TROPONINI in the last 168 hours.  BNP (last 3 results) Recent Labs    04/27/21 0349 06/17/21 0957 06/29/21 1818  BNP 1,077.8* 2,771.3* 3,309.2*    ProBNP (last 3 results) No results for input(s): PROBNP in the last 8760 hours.  CBG: No results for input(s): GLUCAP in the last 168 hours.  Radiological Exams on Admission: DG Chest 2 View  Result Date: 07/11/2021 CLINICAL DATA:  Chest pain  EXAM: CHEST - 2 VIEW COMPARISON:  Radiographs dated June 29, 2021 and CT chest dated January 31, 2021 FINDINGS: Cardiomegaly with atherosclerotic calcification of aortic arch. Postsurgical changes for prior left upper lobectomy with elevation of the left hemidiaphragm. Low lung volumes without evidence of focal consolidation. Chronic deformity of the left rib, consistent with old healed fracture. Multiple surgical clips along the right chest wall. 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 IMPRESSION: 1.  Stable cardiomegaly. 2. Low lung volumes with postsurgical changes of the left lung are unchanged. No evidence of focal consolidation or pleural effusion. Electronically Signed   By: Larose Hires D.O.   On: 07/11/2021 15:39   CT CHEST ABDOMEN PELVIS WO CONTRAST  Result Date: 07/12/2021 CLINICAL DATA:  Chest/abdominal pain.  History of lung cancer. EXAM: CT CHEST, ABDOMEN AND PELVIS WITHOUT CONTRAST TECHNIQUE: Multidetector CT imaging of the chest, abdomen and pelvis was performed following the standard protocol without IV contrast. RADIATION DOSE REDUCTION: This exam was performed according to the departmental dose-optimization program which includes automated exposure control, adjustment of the mA and/or kV according to patient size and/or use of iterative reconstruction technique. COMPARISON:  CT abdomen/pelvis dated 06/29/2021. CT chest dated 01/31/2021. FINDINGS: CT CHEST FINDINGS Cardiovascular: Mild cardiomegaly.  Small pericardial effusion. No evidence of thoracic aortic aneurysm. Atherosclerotic calcifications of the arch. Three vessel coronary atherosclerosis. Mitral valve annular calcifications. Mediastinum/Nodes: No suspicious mediastinal lymphadenopathy. Status post thyroidectomy. Lungs/Pleura: Patchy right lower lobe opacity, similar to the prior, atelectasis versus pneumonia. Status post left upper lobectomy. Mild centrilobular and paraseptal emphysematous changes, right  upper lobe predominant. 6 mm right upper lobe nodule (series 5/image 31), possibly infectious given the right lower lobe process, although this is  new from prior chest CT and warrants follow-up in the setting of prior cancer. Small right and loculated trace left pleural effusions, unchanged. No pneumothorax. Musculoskeletal: Degenerative changes of the thoracic spine. CT ABDOMEN PELVIS FINDINGS Hepatobiliary: Unenhanced liver is unremarkable. Gallbladder is unremarkable. No intrahepatic or extrahepatic ductal dilatation. Pancreas: Dominant 2.7 cm cystic lesion along the pancreatic uncinate process (series 3/image 61), unchanged. Spleen: Within normal limits. Adrenals/Urinary Tract: Adrenal glands are within normal limits. Multiple cystic and hemorrhagic bilateral renal lesions, measuring up to 4.4 cm in the anterior left lower kidney (series 3/image 85), poorly evaluated on unenhanced CT but unchanged. No renal calculi or hydronephrosis. Bladder is within normal limits. Stomach/Bowel: Stomach is within normal limits. No evidence of bowel obstruction. Normal appendix (series 3/image 76). Left colonic diverticulosis, without evidence of diverticulitis. Vascular/Lymphatic: No evidence of abdominal aortic aneurysm. Atherosclerotic calcifications of the abdominal aorta and branch vessels. No suspicious abdominopelvic lymphadenopathy. Reproductive: Calcified uterine fibroids. Bilateral ovaries are unremarkable. Other: No abdominopelvic ascites. Small fat containing periumbilical hernia (series 3/image 93). Musculoskeletal: Degenerative changes of the lumbar spine. Mild anterior wedging at L5, unchanged. IMPRESSION: Patchy right lower lobe opacity, unchanged from recent prior, atelectasis versus pneumonia. Small bilateral pleural effusions, right greater than left, unchanged. Status post left upper lobectomy. 6 mm right upper lobe nodule, new from 2022, possibly infectious but warranting follow-up. Consider follow-up CT  chest in 3-6 months. No acute findings in the abdomen/pelvis to account for the patient's abdominal pain. Stable chronic ancillary findings as above. Electronically Signed   By: Charline Bills M.D.   On: 07/12/2021 00:15   US Abdomen Limited RUQ (LIVER/GB)  Result Date: 07/11/2021 CLINICAL DATA:  Elevated LFTs EXAM: ULTRASOUND ABDOMEN LIMITED RIGHT UPPER QUADRANT COMPARISON:  None. FINDINGS: Gallbladder: 3 mm gallstone. No gallbladder wall thickening or pericholecystic fluid. Negative sonographic Murphy's sign. Common bile duct: Diameter: 3 mm Liver: At the upper limits of normal for parenchymal echogenicity. No focal hepatic lesion is seen. Portal vein is patent on color Doppler imaging with normal direction of blood flow towards the liver. Other: None. IMPRESSION: Tiny layering gallstone, without associated inflammatory changes to suggest acute cholecystitis. Electronically Signed   By: Charline Bills M.D.   On: 07/11/2021 21:14    EKG: I independently viewed the EKG done and my findings are as followed: Rate of 86.  Nonspecific ST-T changes.  RBBB, QTc 533.  Assessment/Plan Present on Admission:  Chest pain  Principal Problem:   Chest pain  Atypical chest pain Onset on the day of presentation Received a full dose of aspirin via EMS. History of coronary artery disease status post PCI of the RCA in 2021. High-sensitivity troponin peaked at 87 and downtrending. No evidence of acute ischemia on 12 EKG. Resume home cardiac regimen, she is on DAPT, aspirin and ticagrelor. Resume home Nexlizet, she is also on Praluent every 2 weeks Continue to monitor on telemetry.  Elevated liver chemistries Elevated lipase No gallstone, gallbladder thickening, common bile duct dilatation, seen on CT scan abdomen and pelvis without contrast. Avoid hepatotoxic agents Repeat CMP and lipase level in the morning If T bilirubin or lipase worsen consider abdominal ultrasound or MRCP.  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  Anion gap metabolic acidosis in the setting of advanced CKD Serum bicarb 18 anion gap of 16 Oral bicarb replacement as well as judicious potassium replacement. Repeat renal panel in the morning.  CKD 4 Appears to be at her baseline creatinine 2.34 with GFR of 21. Avoid nephrotoxic agents, and hypotension. Closely monitor urine output with strict I's and O's. Repeat renal panel in the morning.  Anemia of chronic disease Hemoglobin 11.2 No overt bleeding Continue to monitor H&H  Hypokalemia Serum potassium 3.0 Repleted orally Magnesium 2.7.  Hypermagnesemia Magnesium 2.7 Monitor magnesium level If magnesium level worsens give calcium gluconate  Physical debility PT OT to assess Fall precautions      Critical care time: 65 minutes.     DVT prophylaxis: Subcu Lovenox daily.  Code Status: Full code.  Family Communication: Daughter at bedside.  Disposition Plan: Admitted to progressive unit  Consults called: Consult cardiology in the morning.  Admission status: Inpatient status.   Status is: Inpatient Patient requires at least admitted for further evaluation and treatment of present condition.   Darlin Drop MD [facility] Pager [phone]  If 7PM-7AM, please contact night-coverage [email or web address] Password [email or web address]  07/12/2021, 1:46 AM

## 2021-07-12 NOTE — Plan of Care (Signed)
?  Problem: Nutrition ?Goal: Patient maintains adequate hydration ?Outcome: Progressing ?  ?

## 2021-07-12 NOTE — Progress Notes (Signed)
?  Transition of Care (TOC) Screening Note ? ? ?Patient Details  ?Name: Karen West ?Date of Birth: 1941/03/03 ? ? ?Transition of Care (TOC) CM/SW Contact:    ?Vonya Ohalloran, LCSW ?Phone Number: ?07/12/2021, 3:59 PM ? ? ? ?Transition of Care Department [facility]) has reviewed patient and no TOC needs have been identified at this time. We will continue to monitor patient advancement through interdisciplinary progression rounds. Patient will benefit from PT/OT consult for disposition recommendations. If new patient transition needs arise, please place a TOC consult. ?  ?

## 2021-07-12 NOTE — Plan of Care (Signed)
?  Problem: Nutrition ?Goal: Patient maintains adequate hydration ?07/12/2021 1101 by Gust Rung, RN ?Outcome: Progressing ?07/12/2021 1101 by Gust Rung, RN ?Outcome: Progressing ?  ?

## 2021-07-12 NOTE — Progress Notes (Signed)
ANTICOAGULATION CONSULT NOTE - Initial Consult ? ?Pharmacy Consult for heparin  ?Indication: DVT ? ?Allergies  ?Allergen Reactions  ? Tramadol Nausea And Vomiting and Other (See Comments)  ?  "got sick to my stomach and was throwing up blood; it put me in the hospital")  ? Lisinopril Rash and Other (See Comments)  ?  Renal failure ?  ? Rosuvastatin Other (See Comments)  ?  Other reaction(s): rhabdo  ? Tapazole [Methimazole] Itching and Rash  ? ? ?Vital Signs: ?Temp: 97.4 ?F (36.3 ?C) (04/25 1159) ?Temp Source: Oral (04/25 1159) ?BP: 141/91 (04/25 1159) ?Pulse Rate: 78 (04/25 1159) ? ?Labs: ?Recent Labs  ?  07/11/21 ?1520 07/11/21 ?1722 07/11/21 ?2035 07/12/21 ?9390 07/12/21 ?0431 07/12/21 ?1059  ?HGB 11.2*  --   --   --  10.3*  --   ?HCT 36.8  --   --   --  32.6*  --   ?PLT 202  --   --   --  178  --   ?CREATININE 2.34*  --   --   --  2.07* 2.08*  ?TROPONINIHS 81* 71* 87* 60*  --   --   ? ? ?Estimated Creatinine Clearance: 19.7 mL/min (A) (by C-G formula based on SCr of 2.08 mg/dL (H)). ? ? ?Medical History: ?Past Medical History:  ?Diagnosis Date  ? Atherosclerosis of aorta ([place])   ? a. 01/2017/03/2017 - noted on high res chest CTs.  ? Breast cancer ([place])   ? a. Bilateral --> s/p left mastectomy  ? Carotid artery disease ([place])   ? a. 05/90 w/ 33-00% LICA stenosis and <76% RICA stenosis; b. 10/2015 Carotid U/S: < 50% BICA stenosis  ? Chest pain   ? a. 09/2011 MV: EF 68%, no ischemia/infarct.  ? Chronic anemia   ? Chronic headaches   ? denies  ? Coronary artery calcification seen on CT scan   ? a. 01/2017 High res CT: atherosclerotic calcification of the arterial vascularture, including severe involvement of the coronary arteries; b. 03/2017 CT Chest: coronary and Ao atheroscelrosis.  ? GERD (gastroesophageal reflux disease)   ? History of echocardiogram   ? a. 09/2011 Echo: EF 55-60%, no rwma, triv AI, PASP 103mmHg.  ? Hyperlipidemia   ? Hypertension   ? Hyperthyroidism   ? Left upper lobe pulmonary nodule   ? a. 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 05/2017 s/p VATS.  ? Multinodular goiter   ? a. 02/2017 PET scan- Hypermetabolic nodule;  b. 05/5454 s/p thyroidectomy  ? Myocardial infarction [facility]) 06/2019  ? NONSTEMI  ? Obesity   ? OSA on CPAP   ? cpap  ? Osteoarthritis   ? Personal history of radiation therapy 1999  ? Right bundle branch block   ? Type II or unspecified type diabetes mellitus without mention of complication, uncontrolled   ? ? ?Medications:  ?Medications Prior to Admission  ?Medication Sig Dispense Refill Last Dose  ? Alirocumab (PRALUENT) 150 MG/ML SOAJ Inject 150 mg into the skin every 14 (fourteen) days. 1 mL 0   ? aspirin EC 81 MG tablet Take 81 mg by mouth daily. Swallow whole.     ? Bempedoic Acid-Ezetimibe (NEXLIZET) 180-10 MG TABS Take 1 tablet by mouth daily.     ? cefdinir (OMNICEF) 300 MG capsule Take 1 capsule (300 mg total) by mouth daily. 5 capsule 0   ? Cholecalciferol (VITAMIN D3) 50 MCG (2000 UT) TABS Take  2,000 Units by mouth daily.     ? doxycycline (VIBRAMYCIN) 100 MG capsule Take 1 capsule (100 mg total) by mouth 2 (two) times daily. 10 capsule 0   ? empagliflozin (JARDIANCE) 10 MG TABS tablet Take 1 tablet (10 mg total) by mouth daily before breakfast. 30 tablet 6   ? ferrous sulfate 325 (65 FE) MG tablet Take 325 mg by mouth 2 (two) times daily.     ? hydrALAZINE (APRESOLINE) 25 MG tablet Take 1.5 tablets (37.5 mg total) by mouth every 8 (eight) hours. 135 tablet 6   ? hydrocortisone cream 1 % Apply 1 application topically 2 (two) times daily as needed for itching.      ? insulin degludec (TRESIBA FLEXTOUCH) 100 UNIT/ML FlexTouch Pen 10 units     ? isosorbide mononitrate (IMDUR) 60 MG 24 hr tablet Take 1 tablet (60 mg total) by mouth daily. 30 tablet 6   ? KLOR-CON M10 10 MEQ tablet Take 10 mEq by mouth daily.     ? levothyroxine (SYNTHROID, LEVOTHROID) 88 MCG tablet Take 88 mcg by mouth daily before  breakfast.   5   ? linagliptin (TRADJENTA) 5 MG TABS tablet 1 tablet     ? metoprolol succinate (TOPROL-XL) 25 MG 24 hr tablet Take 0.5 tablets (12.5 mg total) by mouth at bedtime. 30 tablet 6   ? nitroGLYCERIN (NITROSTAT) 0.4 MG SL tablet Place 1 tablet (0.4 mg total) under the tongue every 5 (five) minutes as needed for chest pain. 25 tablet 3   ? Omega-3 Fatty Acids (FISH OIL) 1200 MG CAPS Take 1,200 mg by mouth 2 (two) times daily.      ? omeprazole (PRILOSEC) 20 MG capsule Take 20 mg by mouth 2 (two) times daily.      ? ticagrelor (BRILINTA) 90 MG TABS tablet Take 1 tablet (90 mg total) by mouth 2 (two) times daily. 180 tablet 3   ? torsemide (DEMADEX) 20 MG tablet Take 1 tablet (20 mg total) by mouth daily. 90 tablet 3   ? vitamin B-12 (CYANOCOBALAMIN) 1000 MCG tablet Take 1,000 mcg by mouth daily.     ? ?Scheduled:  ? aspirin EC  81 mg Oral Daily  ? Bempedoic Acid-Ezetimibe  1 tablet Oral Daily  ? insulin aspart  0-5 Units Subcutaneous QHS  ? insulin aspart  0-9 Units Subcutaneous TID WC  ? potassium chloride  40 mEq Oral BID  ? senna-docusate  2 tablet Oral QHS  ? ticagrelor  90 mg Oral BID  ? ? ?Assessment: ?81 yo female with LLE DVT to start heparin. No anticoagulants noted PTA but noted on ticagrelor and DES placed 04/23/21.  ?-hg= 10.3 ?-lovenox 30mg  given ~ 9am ? ?Goal of Therapy:  ?Heparin level 0.3-0.7 units/ml ?Monitor platelets by anticoagulation protocol: Yes ?  ?Plan:  ?-heparin 2000 units x1 (reduced bolus due to recent lovenox) then 1100 units/hr ?-Heparin level in 8 hours and daily wth CBC daily ? ?Hildred Laser, PharmD ?Clinical Pharmacist ?**Pharmacist phone directory can now be found on [email or web address] ([email or web address]).  Listed under [place]. ? ? ? ?

## 2021-07-12 NOTE — Progress Notes (Signed)
Pharmacy note: Home medications ? ?She is taking bempedoic acid at home and was started in the lipid clinic with samples with plans to change to bempedoic acid/ezetemibe.  ?-bempedoic acid is not carried by pharmacy ? ?Plan ?-will hold bempedoic acid during admission ?-plans are to start bempedoic acid/ezetemibe later per clinic notes ? ?Hildred Laser, PharmD ?Clinical Pharmacist ?**Pharmacist phone directory can now be found on [email or web address] ([email or web address]).  Listed under [place]. ? ? ? ? ?

## 2021-07-12 NOTE — Consult Note (Addendum)
?  ?Advanced Heart Failure Team Consult Note ? ? ?Primary Physician: Deland Pretty, MD ?PCP-Cardiologist:  Minus Breeding, MD ?HF MD: Dr Haroldine Laws  ? ?Reason for Consultation: A/C HFrEf  ? ?HPI:   ? ?Karen West is seen today for evaluation of A/C HFrEf  at the request of Dr Sloan Leiter.  ? ?Karen West is a 81 y.o. female with CAD status post PCI to the RCA and DES CFX, hypertension, CKD stage IV, anemia, diabetes, gout, breast cancer left, sleep apnea, mitral regurgitation and chronic HFrEF.  ? ?Admitted 2/23 for inferolateral STEMI.  Course c/b by severe mitral valve regurgitation, AKI, systolic heart failure and anemia. Underwent cath on 2/4 with multivessel coronary disease requiring PCI /DES CFX with medical therapy for 70% LAD. Plan to continue DAPT + praluent and zetia. Intolerant statins. Initial EF 30-35% but TEE 40-45% with severe mitral regurgitation.  Diuresed with IV lasix. Placed on bb, hydralazine/imdur, and SGLT2i. No diuretics with addition of SGLT2i. Plan to try an manage medically. Plan to send to [facility] to consider mitral valve replacem3nt. Discharged home, weight 143 lbs. ?  ?Follow up with Dr. Ali Lowe 3/23, and referred to [facility] for percutaneous MVR. Lasix 40 mg daily started but reduced to every other day due to kidney function. ? ?She was  seen in the HF clinic 06/17/21. Volume overloaded. Lasix switched to torsemide 40 mg x3  days then 20 mg daily.  ? ?On 06/29/21 evaluated in the ED. Treated for CAP. Discharged same day on omnicef and doxycyline.  ? ?Presented to ED with increased shortness of breath, chills,and abdominal pain. Started feeling bad over the last 24-48 hours. Reports med compliance. Weight had gone up 2 pounds. Afebrile. Pertinent admission labs:HS Trop 81>71>87, WBC 11, Hgb 11.2, Creatinine 2.34, and CO2 18.  CT abd/chest patchy RLL opacity atelectasis versus PNA, small pleural effusions, S/P LU lobectomy , and new RUL nodule. Abd [place]- negative for  cholecystitis.  Earlier this morning having increased PVCs. K and Mag replaced. Lower extremity ultrasound negative for DVT. ? ?SOB with min exertion.  ? ? ?Cardiac Studies  ?-Echo (2/23): EF 30-35% and severe MR ?  ?- TEE (2/23): 35-40% severe MR ?  ?- RHC (2/23): showed normal cardiac output  ?Ao (non-invasive) = 132/77 ?RA = 3 ?RV = 60/8 ?PA = 55/21 (37) ?PCW = 22 (v waves to 40) ?Thermo cardiac output/index = 3.3/2.0 ?Fick CO/CI = 5.0/3.0 ?PVR = 4.5 (Fick) 3.0 (Thermal) ?FA sat = 98% ?PA sat = 63%, 64% ?PAPi  = 11   ? ?LHC 04/23/2021  ?  Prox LAD to Mid LAD lesion is 70% stenosed. ?  Prox LAD lesion is 50% stenosed. ?  Mid Cx lesion is 100% stenosed. ?  Dist Cx lesion is 80% stenosed. ?  3rd Mrg lesion is 100% stenosed. ?  Non-stenotic Dist RCA lesion was previously treated. ?  Non-stenotic Mid RCA to Dist RCA lesion was previously treated. ?  A drug-eluting stent was successfully placed. ?  Post intervention, there is a 0% residual stenosis. ?  Post intervention, there is a 0% residual stenosis. ?  There is moderate left ventricular systolic dysfunction. ? ?Review of Systems: [y] = yes, [ ]  = no  ? ?General: Weight gain [Y ]; Weight loss [ ] ; Anorexia [ ] ; Fatigue [ Y]; Fever [ ] ; Chills [ ] ; Weakness [ ]   ?Cardiac: Chest pain/pressure [ ] ; Resting SOB [ ] ; Exertional SOB [ Y]; Orthopnea [ Y]; Pedal Edema [ Y];  Palpitations [ ] ; Syncope [ ] ; Presyncope [ ] ; Paroxysmal nocturnal dyspnea[ ]   ?Pulmonary: Cough [ ] ; Wheezing[ ] ; Hemoptysis[ ] ; Sputum [ ] ; Snoring [ ]   ?GI: Vomiting[ ] ; Dysphagia[ ] ; Melena[ ] ; Hematochezia [ ] ; Heartburn[ ] ; Abdominal pain [ ] ; Constipation [ ] ; Diarrhea [ ] ; BRBPR [ ]   ?GU: Hematuria[ ] ; Dysuria [ ] ; Nocturia[ ]   ?Vascular: Pain in legs with walking [ ] ; Pain in feet with lying flat [ ] ; Non-healing sores [ ] ; Stroke [ ] ; TIA [ ] ; Slurred speech [ ] ;  ?Neuro: Headaches[ ] ; Vertigo[ ] ; Seizures[ ] ; Paresthesias[ ] ;Blurred vision [ ] ; Diplopia [ ] ; Vision changes [ ]   ?Ortho/Skin: Arthritis [ ] ;  Joint pain [ Y]; Muscle pain [ ] ; Joint swelling [ ] ; Back Pain [ Y]; Rash [ ]   ?Psych: Depression[ ] ; Anxiety[ ]   ?Heme: Bleeding problems [ ] ; Clotting disorders [ ] ; Anemia [ ]   ?Endocrine: Diabetes [ Y]; Thyroid dysfunction[ ]  ? ?Home Medications ?Prior to Admission medications   ?Medication Sig Start Date End Date Taking? Authorizing Provider  ?Alirocumab (PRALUENT) 150 MG/ML SOAJ Inject 150 mg into the skin every 14 (fourteen) days. 02/23/20   Minus Breeding, MD  ?aspirin EC 81 MG tablet Take 81 mg by mouth daily. Swallow whole.    [provider]  ?Bempedoic Acid-Ezetimibe (NEXLIZET) 180-10 MG TABS Take 1 tablet by mouth daily.    [provider]  ?cefdinir (OMNICEF) 300 MG capsule Take 1 capsule (300 mg total) by mouth daily. 06/30/21   Quintella Reichert, MD  ?Cholecalciferol (VITAMIN D3) 50 MCG (2000 UT) TABS Take 2,000 Units by mouth daily.    [provider]  ?doxycycline (VIBRAMYCIN) 100 MG capsule Take 1 capsule (100 mg total) by mouth 2 (two) times daily. 06/30/21   Quintella Reichert, MD  ?empagliflozin (JARDIANCE) 10 MG TABS tablet Take 1 tablet (10 mg total) by mouth daily before breakfast. 05/17/21   Clegg, Amy D, NP  ?ferrous sulfate 325 (65 FE) MG tablet Take 325 mg by mouth 2 (two) times daily. 09/27/20   [provider]  ?hydrALAZINE (APRESOLINE) 25 MG tablet Take 1.5 tablets (37.5 mg total) by mouth every 8 (eight) hours. 05/17/21   Clegg, Amy D, NP  ?hydrocortisone cream 1 % Apply 1 application topically 2 (two) times daily as needed for itching.     [provider]  ?insulin degludec (TRESIBA FLEXTOUCH) 100 UNIT/ML FlexTouch Pen 10 units 06/14/21   [provider]  ?isosorbide mononitrate (IMDUR) 60 MG 24 hr tablet Take 1 tablet (60 mg total) by mouth daily. 05/18/21   Clegg, Amy D, NP  ?KLOR-CON M10 10 MEQ tablet Take 10 mEq by mouth daily. 06/01/21   [provider]  ?levothyroxine (SYNTHROID, LEVOTHROID) 88 MCG tablet Take 88 mcg by mouth  daily before breakfast.  07/02/17   [provider]  ?linagliptin (TRADJENTA) 5 MG TABS tablet 1 tablet 05/30/21   [provider]  ?metoprolol succinate (TOPROL-XL) 25 MG 24 hr tablet Take 0.5 tablets (12.5 mg total) by mouth at bedtime. 05/17/21   Clegg, Amy D, NP  ?nitroGLYCERIN (NITROSTAT) 0.4 MG SL tablet Place 1 tablet (0.4 mg total) under the tongue every 5 (five) minutes as needed for chest pain. 10/28/19 04/24/22  Deberah Pelton, NP  ?Omega-3 Fatty Acids (FISH OIL) 1200 MG CAPS Take 1,200 mg by mouth 2 (two) times daily.     [provider]  ?omeprazole (PRILOSEC) 20 MG capsule Take 20 mg by mouth  2 (two) times daily.     [provider]  ?ticagrelor (BRILINTA) 90 MG TABS tablet Take 1 tablet (90 mg total) by mouth 2 (two) times daily. 06/01/21   Minus Breeding, MD  ?torsemide (DEMADEX) 20 MG tablet Take 1 tablet (20 mg total) by mouth daily. 06/17/21   Rafael Bihari, FNP  ?vitamin B-12 (CYANOCOBALAMIN) 1000 MCG tablet Take 1,000 mcg by mouth daily.    [provider]  ? ? ?Past Medical History: ?Past Medical History:  ?Diagnosis Date  ? Atherosclerosis of aorta ([place])   ? a. 01/2017/03/2017 - noted on high res chest CTs.  ? Breast cancer ([place])   ? a. Bilateral --> s/p left mastectomy  ? Carotid artery disease ([place])   ? a. 06/2351 w/ 61-44% LICA stenosis and <31% RICA stenosis; b. 10/2015 Carotid U/S: < 50% BICA stenosis  ? Chest pain   ? a. 09/2011 MV: EF 68%, no ischemia/infarct.  ? Chronic anemia   ? Chronic headaches   ? denies  ? Coronary artery calcification seen on CT scan   ? a. 01/2017 High res CT: atherosclerotic calcification of the arterial vascularture, including severe involvement of the coronary arteries; b. 03/2017 CT Chest: coronary and Ao atheroscelrosis.  ? GERD (gastroesophageal reflux disease)   ? History of echocardiogram   ? a. 09/2011 Echo: EF 55-60%, no rwma, triv AI, PASP 38mmHg.  ? Hyperlipidemia   ? Hypertension   ? Hyperthyroidism   ? Left  upper lobe pulmonary nodule   ? a. 02/2017 PET: slowing enlarging 1.7cm LUL nodule w/ low-grade metabolic activity; b. 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 05/2017 s/p VATS.  ? Multinodular goiter   ? a.

## 2021-07-12 NOTE — TOC Benefit Eligibility Note (Signed)
Patient Advocate Encounter ? ?Insurance verification completed.   ? ?The patient is currently admitted and upon discharge could be taking Xarelto 20 mg. ? ?The current 30 day co-pay is, $47.00.  ? ?The patient is currently admitted and upon discharge could be taking Eliquis 5 mg. ? ?The current 30 day co-pay is, $325.26.  ? ?The patient is insured through [place] Medicare Part D  ? ? ? ?Lyndel Safe, CPhT ?Pharmacy Patient Advocate Specialist ?[facility] Patient Advocate Team ?Direct Number: [phone]  Fax: [phone] ? ? ? ? ? ?  ?

## 2021-07-12 NOTE — Progress Notes (Signed)
Lower extremity venous LT study completed. ? ?Preliminary results relayed to [facility], Therapist, sports. ? ?See CV Proc for preliminary results report.  ? ?Darlin Coco, RDMS, RVT ? ? ?

## 2021-07-12 NOTE — Progress Notes (Signed)
Patient was seen and examined in the morning rounds.  Admitted early morning hours by nighttime hospitalist.  She was sleepy, denied any complaints at the wrist.  She was not able to have much conversation, mostly tired overnight from ER to the hospital.  Tried to call her daughter, could not get through. ? ?In brief, 81 year old with extensive cardiovascular problems including coronary artery disease, recent STEMI and multiple stents on dual antiplatelet therapy, essential hypertension, CKD stage IV, type 2 diabetes on insulin, hypothyroidism, heart failure with reduced ejection fraction 35 to 40%, severe mitral regurgitation and ischemic cardiomyopathy who was recently admitted with pneumonia presented back from home with sudden onset of chest pain, shortness of breath, abdominal pain and chills associated to poor appetite.  In the emergency room afebrile.  Blood pressure stable.  Respiration 30.  93% on room air.  Potassium 3, creatinine 2.34, transaminases elevated. ? ?Chest pain in a patient with coronary artery disease, recent coronary stents ?Elevated liver enzymes secondary to congestive hepatopathy from severe mitral regurgitation and ischemic cardiomyopathy ?Acute left lower extremity edema, acute DVT left lower extremity. ?Acute on chronic kidney disease stage IV: ?Hypokalemia: ?Anemia of chronic disease: ? ?Plans: ?Aggressively replace electrolytes. ?Patient may need advanced heart failure therapy, addressing severe mitral regurgitation due to recurrent fluid overload and hospitalizations.  Not sure she is an operative candidate. ?We will start heparin for lower extremity DVT, will need DOAC.  Will be challenging with dual antiplatelet therapy she is already on.  We will hold off on starting Eliquis in case she needs cardiac cath or procedures. ?I will defer her advanced cardiovascular management to cardiology.  Consult placed. ? ? ?Total time spent: 30 minutes.  Same-day admission.  No charge visit. ?

## 2021-07-12 NOTE — Progress Notes (Signed)
Peripherally Inserted Central Catheter Placement ? ?The IV Nurse has discussed with the patient and/or persons authorized to consent for the patient, the purpose of this procedure and the potential benefits and risks involved with this procedure.  The benefits include less needle sticks, lab draws from the catheter, and the patient may be discharged home with the catheter. Risks include, but not limited to, infection, bleeding, blood clot (thrombus formation), and puncture of an artery; nerve damage and irregular heartbeat and possibility to perform a PICC exchange if needed/ordered by physician.  Alternatives to this procedure were also discussed.  Bard Power PICC patient education guide, fact sheet on infection prevention and patient information card has been provided to patient /or left at bedside.   ? ?PICC Placement Documentation  ?PICC Double Lumen 07/12/21 Right Brachial 38 cm 1 cm (Active)  ?Indication for Insertion or Continuance of Line Vasoactive infusions 07/12/21 2105  ?Exposed Catheter (cm) 1 cm 07/12/21 2105  ?Site Assessment Clean, Dry, Intact 07/12/21 2105  ?Lumen #1 Status Flushed;Saline locked;Blood return noted 07/12/21 2105  ?Lumen #2 Status Flushed;Saline locked;Blood return noted 07/12/21 2105  ?Dressing Type Securing device;Transparent 07/12/21 2105  ?Dressing Status Antimicrobial disc in place;Clean, Dry, Intact 07/12/21 2105  ?Dressing Intervention New dressing 07/12/21 2105  ?Dressing Change Due 07/19/21 07/12/21 2105  ? ? ? ? ? ?Shon Hale ?07/12/2021, 9:42 PM ? ?

## 2021-07-13 ENCOUNTER — Inpatient Hospital Stay (HOSPITAL_COMMUNITY): Payer: Medicare Other

## 2021-07-13 DIAGNOSIS — I5023 Acute on chronic systolic (congestive) heart failure: Secondary | ICD-10-CM

## 2021-07-13 DIAGNOSIS — R079 Chest pain, unspecified: Secondary | ICD-10-CM | POA: Diagnosis not present

## 2021-07-13 LAB — CBC WITH DIFFERENTIAL/PLATELET
Abs Immature Granulocytes: 0.06 10*3/uL (ref 0.00–0.07)
Basophils Absolute: 0 10*3/uL (ref 0.0–0.1)
Basophils Relative: 0 %
Eosinophils Absolute: 0 10*3/uL (ref 0.0–0.5)
Eosinophils Relative: 0 %
HCT: 29.9 % — ABNORMAL LOW (ref 36.0–46.0)
Hemoglobin: 9.4 g/dL — ABNORMAL LOW (ref 12.0–15.0)
Immature Granulocytes: 1 %
Lymphocytes Relative: 7 %
Lymphs Abs: 0.7 10*3/uL (ref 0.7–4.0)
MCH: 28.8 pg (ref 26.0–34.0)
MCHC: 31.4 g/dL (ref 30.0–36.0)
MCV: 91.7 fL (ref 80.0–100.0)
Monocytes Absolute: 0.9 10*3/uL (ref 0.1–1.0)
Monocytes Relative: 10 %
Neutro Abs: 7.6 10*3/uL (ref 1.7–7.7)
Neutrophils Relative %: 82 %
Platelets: 187 10*3/uL (ref 150–400)
RBC: 3.26 MIL/uL — ABNORMAL LOW (ref 3.87–5.11)
RDW: 22.5 % — ABNORMAL HIGH (ref 11.5–15.5)
Smear Review: NORMAL
WBC: 9.3 10*3/uL (ref 4.0–10.5)
nRBC: 0.8 % — ABNORMAL HIGH (ref 0.0–0.2)

## 2021-07-13 LAB — COMPREHENSIVE METABOLIC PANEL
ALT: 319 U/L — ABNORMAL HIGH (ref 0–44)
AST: 80 U/L — ABNORMAL HIGH (ref 15–41)
Albumin: 2.5 g/dL — ABNORMAL LOW (ref 3.5–5.0)
Alkaline Phosphatase: 140 U/L — ABNORMAL HIGH (ref 38–126)
Anion gap: 12 (ref 5–15)
BUN: 77 mg/dL — ABNORMAL HIGH (ref 8–23)
CO2: 19 mmol/L — ABNORMAL LOW (ref 22–32)
Calcium: 7.9 mg/dL — ABNORMAL LOW (ref 8.9–10.3)
Chloride: 106 mmol/L (ref 98–111)
Creatinine, Ser: 2.33 mg/dL — ABNORMAL HIGH (ref 0.44–1.00)
GFR, Estimated: 21 mL/min — ABNORMAL LOW (ref >60)
Glucose, Bld: 188 mg/dL — ABNORMAL HIGH (ref 70–99)
Potassium: 4 mmol/L (ref 3.5–5.1)
Sodium: 137 mmol/L (ref 135–145)
Total Bilirubin: 1.6 mg/dL — ABNORMAL HIGH (ref 0.3–1.2)
Total Protein: 6.7 g/dL (ref 6.5–8.1)

## 2021-07-13 LAB — COOXEMETRY PANEL
Carboxyhemoglobin: 1.6 % — ABNORMAL HIGH (ref 0.5–1.5)
Carboxyhemoglobin: 1.7 % — ABNORMAL HIGH (ref 0.5–1.5)
Carboxyhemoglobin: 2 % — ABNORMAL HIGH (ref 0.5–1.5)
Methemoglobin: <0.7 % (ref 0.0–1.5)
Methemoglobin: <0.7 % (ref 0.0–1.5)
Methemoglobin: <0.7 % (ref 0.0–1.5)
O2 Saturation: 42.8 %
O2 Saturation: 54.7 %
O2 Saturation: 57 %
Total hemoglobin: 10 g/dL — ABNORMAL LOW (ref 12.0–16.0)
Total hemoglobin: 9.5 g/dL — ABNORMAL LOW (ref 12.0–16.0)
Total hemoglobin: 9.6 g/dL — ABNORMAL LOW (ref 12.0–16.0)

## 2021-07-13 LAB — MAGNESIUM: Magnesium: 2.2 mg/dL (ref 1.7–2.4)

## 2021-07-13 LAB — HEPARIN LEVEL (UNFRACTIONATED)
Heparin Unfractionated: 1.08 IU/mL — ABNORMAL HIGH (ref 0.30–0.70)
Heparin Unfractionated: 0.65 IU/mL (ref 0.30–0.70)

## 2021-07-13 LAB — GLUCOSE, CAPILLARY
Glucose-Capillary: 153 mg/dL — ABNORMAL HIGH (ref 70–99)
Glucose-Capillary: 308 mg/dL — ABNORMAL HIGH (ref 70–99)
Glucose-Capillary: 335 mg/dL — ABNORMAL HIGH (ref 70–99)

## 2021-07-13 MED ORDER — MILRINONE LACTATE IN DEXTROSE 20-5 MG/100ML-% IV SOLN
0.2500 ug/kg/min | INTRAVENOUS | Status: DC
  Administered 2021-07-13 – 2021-07-15 (×2): 0.125 ug/kg/min via INTRAVENOUS
  Administered 2021-07-16 – 2021-07-17 (×2): 0.25 ug/kg/min via INTRAVENOUS
  Filled 2021-07-13 (×4): qty 100

## 2021-07-13 MED ORDER — NITROGLYCERIN 0.4 MG SL SUBL: 0.4000 mg | SUBLINGUAL_TABLET | SUBLINGUAL | Status: DC | PRN

## 2021-07-13 MED ORDER — AMIODARONE HCL IN DEXTROSE 360-4.14 MG/200ML-% IV SOLN
30.0000 mg/h | INTRAVENOUS | Status: DC
  Administered 2021-07-13 – 2021-07-19 (×11): 30 mg/h via INTRAVENOUS
  Filled 2021-07-13 (×13): qty 200

## 2021-07-13 MED ORDER — AMIODARONE HCL IN DEXTROSE 360-4.14 MG/200ML-% IV SOLN: 60.0000 mg/h | INTRAVENOUS | Status: AC

## 2021-07-13 MED ORDER — PANTOPRAZOLE SODIUM 40 MG PO TBEC
40.0000 mg | DELAYED_RELEASE_TABLET | Freq: Every day | ORAL | Status: DC
  Administered 2021-07-13 – 2021-07-20 (×8): 40 mg via ORAL
  Filled 2021-07-13 (×8): qty 1

## 2021-07-13 MED ORDER — ISOSORBIDE MONONITRATE ER 60 MG PO TB24
60.0000 mg | ORAL_TABLET | Freq: Every day | ORAL | Status: DC
  Administered 2021-07-14 – 2021-07-16 (×3): 60 mg via ORAL
  Filled 2021-07-13 (×4): qty 1

## 2021-07-13 MED ORDER — DEXTROMETHORPHAN POLISTIREX ER 30 MG/5ML PO SUER
60.0000 mg | Freq: Two times a day (BID) | ORAL | Status: DC
Start: 2021-07-13 — End: 2021-07-20
  Administered 2021-07-13 – 2021-07-20 (×11): 60 mg via ORAL
  Filled 2021-07-13 (×14): qty 10

## 2021-07-13 MED ORDER — METOPROLOL SUCCINATE ER 25 MG PO TB24
12.5000 mg | ORAL_TABLET | Freq: Every day | ORAL | Status: DC
  Administered 2021-07-13 – 2021-07-17 (×5): 12.5 mg via ORAL
  Filled 2021-07-13 (×5): qty 1

## 2021-07-13 MED ORDER — INSULIN GLARGINE-YFGN 100 UNIT/ML ~~LOC~~ SOLN
10.0000 [IU] | Freq: Every day | SUBCUTANEOUS | Status: DC
  Administered 2021-07-14 – 2021-07-20 (×7): 10 [IU] via SUBCUTANEOUS
  Filled 2021-07-13 (×9): qty 0.1

## 2021-07-13 MED ORDER — AMIODARONE HCL IN DEXTROSE 360-4.14 MG/200ML-% IV SOLN
INTRAVENOUS | Status: AC
  Filled 2021-07-13: qty 200

## 2021-07-13 MED ORDER — LEVOTHYROXINE SODIUM 88 MCG PO TABS
88.0000 ug | ORAL_TABLET | Freq: Every day | ORAL | Status: DC
  Administered 2021-07-14 – 2021-07-20 (×7): 88 ug via ORAL
  Filled 2021-07-13 (×7): qty 1

## 2021-07-13 MED ORDER — AMIODARONE IV BOLUS ONLY 150 MG/100ML
150.0000 mg | Freq: Once | INTRAVENOUS | Status: AC
  Filled 2021-07-13 (×2): qty 100

## 2021-07-13 NOTE — Progress Notes (Signed)
?PROGRESS NOTE ? ? ? ?Karen West  ION:[id number] DOB: [birth year] DOA: 07/11/2021 ?PCP: Deland Pretty, MD  ? ? 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  In the emergency room afebrile.  Blood pressure stable.  Respiration 30.  93% on room air.  Potassium 3, creatinine 2.34, transaminases elevated. ? ? ?Assessment & Plan: ?  ?Acute on chronic systolic congestive heart failure ?Ischemic cardiomyopathy with known ejection fraction 25 to 30% ?Severe mitral regurgitation, not a surgical candidate.  Fluid overload secondary to valvular heart disease ?Coronary artery disease status postcoronary stents on dual antiplatelet therapy. ? ?Patient with severe cardiovascular disease.  Currently managed by heart failure team with ?Milrinone infusion 0.25 mcg/kg/min, Lasix 80 mg IV twice daily.  Some clinical improvement today.   ?We will resume home dose of metoprolol and isosorbide.   ?Continue strict intake and output monitoring.  Advanced heart failure therapy as per cardiology. ?Patient is on aspirin and Brilinta after recent coronary stent, started on heparin for acute DVT.  She will need oral anticoagulation, will discuss with cardiology if one of the antiplatelet agents can be dropped. ?Severe mitral regurgitation possibly complicating fluid retention.  Not a surgical candidate. ?Patient with advanced cardiovascular disease with high mortality, will defer to heart failure team to discuss prognosis and possible referral to palliation  or hospice if they deemed necessary. ? ?Acute left lower extremity DVT: Started on heparin.  Will need DOAC.  As above, hopefully she can be off aspirin to avoid bleeding risk.  Will discuss with cardiology. ? ?Acute on chronic kidney disease stage IV: Worsened with #1.  Close monitoring. ? ?Hypokalemia: Replaced.  Adequate. ? ?Congestive hepatopathy: LFTs stable. ? ?Type 2 diabetes, well controlled.  On insulin at home.  Remains on sliding scale insulin.  Will resume low-dose long-acting insulin today. ? ? ?DVT prophylaxis:   Heparin infusion. ? ? ?Code Status: Full code. ?Family Communication: Sister and brother-in-law at the bedside 4/25. ?Disposition Plan: Status is: Inpatient ?Remains inpatient appropriate because: Severe cardiovascular disease.  On vasoactive infusions. ?  ? ? ?Consultants:  ?Cardiology ? ?Procedures:  ?None ? ?Antimicrobials:  ?None ? ? ?Subjective: ?Patient seen and examined.  She tells me she got some restful night.  Patient tells me her breathing is somehow better today.  Denies any chest pain.  She has not mobilized.  I have asked physical therapy and Occupational Therapy to work with her. ? ?Objective: ?Vitals:  ? 07/12/21 1609 07/12/21 2159 07/13/21 0500 07/13/21 0824  ?BP: 126/75 121/73 122/60 (!) 101/57  ?Pulse: 83 83 88 84  ?Resp: 20 17 20 18   ?Temp: (!) 96.6 ?F (35.9 ?C) 97.8 ?F (36.6 ?C) 97.7 ?F (36.5 ?C) 98.2 ?F (36.8 ?C)  ?TempSrc: Axillary Oral Axillary Oral  ?SpO2: 100% 99% 100% 93%  ?Weight:   65.9 kg   ? ? ?Intake/Output Summary (Last 24 hours) at 07/13/2021 1149 ?Last data filed at 07/13/2021 0200 ?Gross per 24 hour  ?Intake 153.17 ml  ?Output --  ?Net 153.17 ml  ? ?Filed Weights  ? 07/13/21 0500  ?Weight: 65.9 kg  ? ? ?Examination: ? ?  General exam: Appears calm and comfortable  ?Frail and debilitated.  Chronically sick looking.  She is somehow anxious. ?Respiratory system: Clear to auscultation.  Does have bibasilar crackles. ?Cardiovascular system: S1 & S2 heard, RRR.   Engorged jugular vein.  Pansystolic murmur at the mitral area. ?Gastrointestinal system: Abdomen is nondistended, soft and nontender. No organomegaly or masses felt. Normal bowel sounds heard. ?Central nervous system: Alert and oriented. No focal neurological deficits. ?Extremities: Symmetric 5 x 5 power.  Generalized weakness. ? ? ? ? ?Data Reviewed: I have personally reviewed following labs and imaging studies ? ?CBC: ?Recent Labs  ?Lab 07/11/21 ?1520 07/12/21 ?0431 07/13/21 ?8250  ?WBC 11.0* 11.5* 9.3  ?NEUTROABS 10.2* 9.8* 7.6  ?HGB 11.2* 10.3* 9.4*  ?HCT 36.8 32.6* 29.9*  ?MCV 95.8 92.9 91.7  ?PLT 202 178 187  ? ?Basic Metabolic Panel: ?Recent Labs  ?Lab 07/11/21 ?1520 07/12/21 ?0050 07/12/21 ?0431 07/12/21 ?1059 07/13/21 ?0507  ?NA 135  --  138 137 137  ?K 3.0*  --  2.8* 3.3* 4.0  ?CL 101  --  107 106 106  ?CO2 18*  --  19* 17* 19*  ?GLUCOSE 187*  --  108* 128* 188*  ?BUN 79*  --  73* 72* 77*  ?CREATININE 2.34*  --  2.07* 2.08* 2.33*  ?CALCIUM 8.2*  --  8.1* 8.1* 7.9*  ?MG  --  2.7* 1.9  --  2.2  ?PHOS  --   --  5.2*  --   --   ? ?GFR: ?Estimated Creatinine Clearance: 17.3 mL/min (A) (by C-G formula based on SCr of 2.33 mg/dL (H)). ?Liver Function Tests: ?Recent Labs  ?Lab 07/11/21 ?1520 07/12/21 ?0431 07/13/21 ?0507  ?AST 138* 92* 80*  ?ALT 519* 413* 319*  ?ALKPHOS 138* 121 140*  ?BILITOT 1.5* 1.5* 1.6*  ?PROT 7.1 6.7 6.7  ?ALBUMIN 2.9* 2.6* 2.5*  ? ?Recent Labs  ?Lab 07/11/21 ?2035 07/12/21 ?0431  ?LIPASE 68* 59*  ? ?No results for input(s): AMMONIA in the last 168 hours. ?Coagulation Profile: ?No results for input(s): INR, PROTIME in the last 168 hours. ?Cardiac Enzymes: ?No results for input(s): CKTOTAL, CKMB, CKMBINDEX, TROPONINI in the last 168 hours. ?BNP (last 3 results) ?No results for input(s): PROBNP in the last 8760 hours. ?HbA1C: ?No results for input(s): HGBA1C in the last 72 hours. ?CBG: ?Recent Labs  ?Lab 07/12/21 ?0803 07/12/21 ?1157 07/12/21 ?1607 07/12/21 ?2142 07/13/21 ?0827  ?GLUCAP 114*  145* 176* 187* 153*  ? ?Lipid Profile: ?No results for input(s): CHOL, HDL, LDLCALC, TRIG, CHOLHDL, LDLDIRECT in the last 72 hours. ?Thyroid Function Tests: ?No results for input(s): TSH, T4TOTAL, FREET4, T3FREE, THYROIDAB in the last 72 hours. ?Anemia Panel: ?No results for input(s): VITAMINB12, FOLATE, FERRITIN, TIBC, IRON, RETICCTPCT in the last 72 hours. ?Sepsis Labs: ?No results for input(s): PROCALCITON, LATICACIDVEN in the last 168 hours. ? ?No results found for this or any previous visit (from the past 240 hour(s)).  ? ? ? ? ? ?Radiology Studies: ?DG Chest 2 View ? ?Result Date: 07/11/2021 ?CLINICAL DATA:  Chest pain EXAM: CHEST - 2 VIEW COMPARISON:  Radiographs dated June 29, 2021 and CT chest dated January 31, 2021 FINDINGS: Cardiomegaly with atherosclerotic calcification of aortic arch. Postsurgical changes for prior left upper lobectomy with elevation of the left hemidiaphragm. Low lung volumes without evidence of focal consolidation. Chronic deformity of the left rib, consistent with old healed fracture. Multiple surgical clips along the right chest wall. 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 IMPRESSION: 1.  Stable cardiomegaly. 2. Low lung volumes with postsurgical changes of the left lung are unchanged. No evidence of focal consolidation or pleural effusion. Electronically Signed   By: Keane Police D.O.   On: 07/11/2021 15:39  ? ?VAS [place] LOWER EXTREMITY VENOUS (DVT) ? ?Result Date: 07/12/2021 ? Lower Venous DVT Study Patient Name:  Karen West  Date of Exam:   07/12/2021 Medical Rec #: [id number]         Accession #:    [id number] Date of Birth: [birth year]          Patient Gender: F Patient Age:   89 years Exam Location:  [facility] Procedure:      VAS [place] LOWER EXTREMITY VENOUS (DVT) Referring Phys: Archie Patten HALL --------------------------------------------------------------------------------  Indications: Left leg swelling.  Comparison Study: No prior studies.  Performing Technologist: Darlin Coco RDMS, RVT  Examination Guidelines: A complete evaluation includes B-mode imaging, spectral Doppler, color Doppler, and power Doppler as needed of all accessible portions of e

## 2021-07-13 NOTE — Evaluation (Signed)
Occupational Therapy Evaluation ?Patient Details ?Name: Karen West ?MRN: [id number] ?DOB: [birth year] ?Today's Date: 07/13/2021 ? ? ?History of Present Illness Pt is 81 yr old F admitted on 07/11/21 with c/o chest pain, admitted with A/C HFrEf. Found to have acute DVT L LE. PMH: breast CA, chest pain, anemia, CAD, HTN, hyperthyroid, MI, OSA, OA, R BBB, DMII, NSTEMI, CKD  ? ?Clinical Impression ?  ?MD cleared for PT & OT assessment. Pt admitted as above, presenting with deficits as listed below (refer to OT problem list). Pt lives alone with family nearby. She is independent without AD at baseline. She currently has overall generalized weakness and decreased activity tolerance secondary to elevated fluctuations in her HR. Pt was up in chair upon OT arrival w/ HR was varying from 100's -200s. Discussed assisting pt back to bed as she c/o feeling dizzy and lightheaded up in chair. At this time, HR was continuing to fluctuate from low 100's-200's, for this reason, pt was assisted back to bed w/ min guard assist. While sitting at EOB, pt required Min A w/ LE's secondary to feeling faint and unable to lift them independently into the bed. At this time, RN was called and notified that pt's HR was increasing again and pt was now symptomatic. PT and RNs arrived to assist OT in getting pt comfortable in bed & on supplemental O2, pt breathing was noted to improve after she was supine & on supplemental O2. Pt independence & activity tolerance should improve with steady HR. Will continue to follow acutely to assist in mazimizing independence w/ functional mobiilty and transfers related to ADL's and activity tolerance. ?   ? ?Recommendations for follow up therapy are one component of a multi-disciplinary discharge planning process, led by the attending physician.  Recommendations may be updated based on patient status, additional functional criteria and insurance authorization.  ? ?Follow Up Recommendations ? Home health OT  ?   ?Assistance Recommended at Discharge Intermittent Supervision/Assistance  ?Patient can return home with the following Assistance with cooking/housework;Assist for transportation;A little help with bathing/dressing/bathroom ? ?  ?Functional Status Assessment ? Patient has had a recent decline in their functional status and demonstrates the ability to make significant improvements in function in a reasonable and predictable amount of time.  ?Equipment Recommendations ? Other (comment) (Continue to assess in functional context)  ?  ?Recommendations for Other Services   ? ? ?  ?Precautions / Restrictions Precautions ?Precautions: Fall;Other (comment) ?Precaution Comments: watch HR ?Restrictions ?Weight Bearing Restrictions: No  ? ?  ? ?Mobility Bed Mobility ?  ?General bed mobility comments: Pt sitting up in chair upon OT arrival. HR fluctuating from low 100's to 200's ?  ? ?Transfers ?Overall transfer level: Needs assistance ?Equipment used: None ?Transfers: Sit to/from Stand ?Sit to Stand: Min guard ?  ?  ?Step pivot transfers: Min guard ?  ?  ?General transfer comment: Mild unsteadiness noted, but no LOB, Extra time, min guard for safety. Min assist with LE's when assisting back to bed after pt became symptomatic after sitting up in chair. ?  ? ?  ?Balance Overall balance assessment: Mild deficits observed, not formally tested ?   ? ?ADL either performed or assessed with clinical judgement  ? ?ADL Overall ADL's : Needs assistance/impaired ?Eating/Feeding: Independent;Sitting ?  ?Grooming: Set up;Sitting ?  ?Upper Body Bathing: Set up;Sitting ?  ?Lower Body Bathing: Sit to/from stand;Sitting/lateral leans;Minimal assistance ?  ?Upper Body Dressing : Set up;Sitting ?  ?Lower Body Dressing: Minimal assistance;Sit  to/from stand;Sitting/lateral leans ?  ?Toilet Transfer: Min guard;Minimal assistance;Ambulation;BSC/3in1 ?Toilet Transfer Details (indicate cue type and reason): Simulated sit to stand transfer from chair to  bed. W/o AD ?Toileting- Clothing Manipulation and Hygiene: Modified independent;Sitting/lateral lean ?  ?  ?Functional mobility during ADLs: Min guard ?General ADL Comments: MD cleared pt for PT and OT Evals. Pt presents as above with deficits as listed below (see OT Problem list). Pt lives alone with family living nearby. She is independent without AD at baseline. She currently has overall generalized weakness, balance deficits, decreased activity tolerance secondary to elevated fluctuations in her HR. Pt was up in chair upon OT arrival w/ HR was varying from 100's -200s. Discussed assisting pt back to bed as she c/o feeling dizzy and lightheaded up in chair. At this time, HR was continuing to fluctuate from low 100's-200's, for this reason, pt was assisted back to bed w/ min guard assist. While sitting at EOB, pt required Min A w/ LE's secondary to feeling faint and unable to lift them independently into the bed. At this time, RN was called and notified that pt's HR was increasing again and pt was now symptomatic. PT and RNs arrived to assist getting pt comfortable in bed & on supplemental O2, pt breathing was noted to improve after she was supine & on supplemental O2. Pt should experience increased independence and improved activity tolerance with steady HR. Recommend HHOT. Will continue to follow acutely to assist in mazimizing independence w/ functional mobiilty and transfers related to ADL's and activity tolerance.  ? ? ? ?Vision Patient Visual Report: No change from baseline ?Vision Assessment?: No apparent visual deficits  ?   ?   ?   ? ?Pertinent Vitals/Pain Pain Assessment ?Pain Assessment: No/denies pain ?Faces Pain Scale: No hurt ?Pain Intervention(s): Monitored during session  ? ? ? ?Hand Dominance Right ?  ?Extremity/Trunk Assessment Upper Extremity Assessment ?Upper Extremity Assessment: Overall WFL for tasks assessed;Generalized weakness ?  ?Lower Extremity Assessment ?Lower Extremity Assessment:  Defer to PT evaluation;Generalized weakness ?  ?Cervical / Trunk Assessment ?Cervical / Trunk Assessment: Kyphotic ?  ?Communication Communication ?Communication: HOH ?  ?Cognition Arousal/Alertness: Awake/alert ?Behavior During Therapy: [facility] for tasks assessed/performed ?Overall Cognitive Status: Within Functional Limits for tasks assessed ?  ?  ?  ?General Comments  HR 90-110s sitting at rest upon arrival with intermittent jump up to 140s but then recover to resting rate again; while ambulating pt's HR jumped up to 170s and varied greatly; once sitting in recliner HR up to low 200s then recovered to 90-110s with intermittent jumps back up to 150-180s then again returned to resting rate; pt asymptomic throughout ? ?  ?   ?   ? ? ?Home Living Family/patient expects to be discharged to:: Private residence ?Living Arrangements: Alone ?Available Help at Discharge: Family;Available PRN/intermittently (sister and brother in law live 3 miles away, daughter nearby as well) ?Type of Home: House ?Home Access: Stairs to enter ?Entrance Stairs-Number of Steps: 1 ?Entrance Stairs-Rails: Right ?Home Layout: One level ?  ?  ?Bathroom Shower/Tub: Walk-in shower ?  ?Bathroom Toilet: Handicapped height ?Bathroom Accessibility: Yes ?  ?Home Equipment: Shower seat - built in;Grab bars - tub/shower;BSC/3in1 ?  ?Additional Comments: drives ?  ? ?  ?Prior Functioning/Environment Prior Level of Function : Independent/Modified Independent;Driving ?  ?  ?Mobility Comments: Does not use AD. ?ADLs Comments: Daughter does majority of cleaning and brings pt food. Pt does not clean or cook much, but otherwise is IND.  Has not been driving much recently. ?  ? ?  ?  ?OT Problem List: Decreased knowledge of use of DME or AE;Decreased activity tolerance;Cardiopulmonary status limiting activity;Impaired balance (sitting and/or standing) ?  ?   ?OT Treatment/Interventions: Self-care/ADL training;Patient/family education;Energy conservation;Therapeutic  activities;DME and/or AE instruction  ?  ?OT Goals(Current goals can be found in the care plan section) Acute Rehab OT Goals ?Patient Stated Goal: Feel better ?OT Goal Formulation: Patient unable to partici

## 2021-07-13 NOTE — Progress Notes (Signed)
? ?  Called by nursing staff at 1145 for tachycardia with rate in the 170s.  ?Milrinone infusing 0.25 mcg + heparin drip.  ? ?K 4 Mag 2.2  ? ?EKG ordered  - Afib RVR 167 bpm ? ?On arrival she is A&Ox3. No chest pain.  ?EKG --->Afib RVR with rate 150-180s, new onset. Already on heparin drip.  ? ?Ordered amio bolus 150 mg x1 with persistent tachycardiac. 2nd bolus ordered followed by amio drip.  ?SBP 114---> 89. Stopped milrinone.  ? ?At 1210 rate less than 100. EKG obtained. SR 74 bpm.  ? ?Check CO-OX  ? ?Dru Laurel NP-C  ?12:25 PM ? ? ? ? ? ? ?

## 2021-07-13 NOTE — Evaluation (Signed)
Physical Therapy Evaluation ?Patient Details ?Name: Karen West ?MRN: [id number] ?DOB: [birth year] ?Today's Date: 07/13/2021 ? ?History of Present Illness ? Pt is 81 yr old F admitted on 07/11/21 with c/o chest pain, admitted with A/C HFrEf. Found to have acute DVT L LE. PMH: breast CA, chest pain, anemia, CAD, HTN, hyperthyroid, MI, OSA, OA, R BBB, DMII, NSTEMI, CKD ?  ?Clinical Impression ? MD cleared pt for PT Eval. Pt presents with condition above and deficits mentioned below, see PT Problem List. PTA, she was living alone in a 1-level house with 1 STE with family living nearby. Pt is IND without an AD at baseline. Currently, pt displays mild generalized weakness and balance deficits along with deficits in activity tolerance, primarily being limited in mobility by an elevated HR. Upon arrival, her HR was varying from 90s-140s while sitting EOB, but once standing her HR jumped up to 170s. Thus, deferred further gait after stepping bed > recliner at a min guard assist level without UE support. While sitting in the chair her HR would vary greatly from 90s-low 200s, but pt asymptomatic. RN notified and confirmed pt could sit up provided pt was asymptomatic and desired to sit up. Pt left sitting up with OT end of session. Of note, PT heard OT call out for RN after PT Eval in regards to pt's HR increasing again and this time pt becoming dizzy. PT and Rns arrived to assist in getting pt comfortable in bed on supplemental O2, with pt's WOB and comfort improving with time in supine on supplemental O2. I suspect pt will progress will if her HR can remain more stable, thus recommending follow-up with HHPT. Will continue to follow acutely. ?   ? ?Recommendations for follow up therapy are one component of a multi-disciplinary discharge planning process, led by the attending physician.  Recommendations may be updated based on patient status, additional functional criteria and insurance authorization. ? ?Follow Up  Recommendations Home health PT ? ?  ?Assistance Recommended at Discharge Intermittent Supervision/Assistance  ?Patient can return home with the following ? Assistance with cooking/housework;Assist for transportation;Help with stairs or ramp for entrance ? ?  ?Equipment Recommendations Rollator (4 wheels) (vs no AD pending progress)  ?Recommendations for Other Services ?    ?  ?Functional Status Assessment Patient has had a recent decline in their functional status and demonstrates the ability to make significant improvements in function in a reasonable and predictable amount of time.  ? ?  ?Precautions / Restrictions Precautions ?Precautions: Fall;Other (comment) ?Precaution Comments: watch HR ?Restrictions ?Weight Bearing Restrictions: No  ? ?  ? ?Mobility ? Bed Mobility ?  ?  ?  ?  ?  ?  ?  ?General bed mobility comments: Pt sitting EOB upon arrival. ?  ? ?Transfers ?Overall transfer level: Needs assistance ?Equipment used: None ?Transfers: Sit to/from Stand, Bed to chair/wheelchair/BSC ?Sit to Stand: Min guard ?  ?Step pivot transfers: Min guard ?  ?  ?  ?General transfer comment: Mild unsteadiness noted, but no LOB, Extra time, min guard for safety ?  ? ?Ambulation/Gait ?Ambulation/Gait assistance: Min guard ?Gait Distance (Feet): 5 Feet ?Assistive device: None ?Gait Pattern/deviations: Step-through pattern, Decreased stride length, Trunk flexed ?Gait velocity: reduced ?Gait velocity interpretation: <1.31 ft/sec, indicative of household ambulator ?  ?General Gait Details: Pt with slow, mildly unsteady gait but no LOB, limited to stepping bed > chair due to elevated HR, min guard for safety. ? ?Stairs ?  ?  ?  ?  ?  ? ?  Wheelchair Mobility ?  ? ?Modified Rankin (Stroke Patients Only) ?  ? ?  ? ?Balance Overall balance assessment: Mild deficits observed, not formally tested ?  ?  ?  ?  ?  ?  ?  ?  ?  ?  ?  ?  ?  ?  ?  ?  ?  ?  ?   ? ? ? ?Pertinent Vitals/Pain Pain Assessment ?Pain Assessment: Faces ?Faces Pain  Scale: No hurt ?Pain Intervention(s): Monitored during session  ? ? ?Home Living Family/patient expects to be discharged to:: Private residence ?Living Arrangements: Alone ?Available Help at Discharge: Family;Available PRN/intermittently (sister and brother-in-law live 3 miles away; daughter lives nearby also) ?Type of Home: House ?Home Access: Stairs to enter ?Entrance Stairs-Rails: Right (ascending) ?Entrance Stairs-Number of Steps: 1 ?  ?Home Layout: One level ?Home Equipment: Shower seat - built in;Grab bars - tub/shower;BSC/3in1 ?   ?  ?Prior Function Prior Level of Function : Independent/Modified Independent;Driving ?  ?  ?  ?  ?  ?  ?Mobility Comments: Does not use AD. ?ADLs Comments: Daughter does majority of cleaning and brings pt food. Pt does not clean or cook much, but otherwise is IND. Has not been driving much recently. ?  ? ? ?Hand Dominance  ?   ? ?  ?Extremity/Trunk Assessment  ? Upper Extremity Assessment ?Upper Extremity Assessment: Defer to OT evaluation ?  ? ?Lower Extremity Assessment ?Lower Extremity Assessment: Generalized weakness ?  ? ?Cervical / Trunk Assessment ?Cervical / Trunk Assessment: Kyphotic  ?Communication  ? Communication: HOH  ?Cognition Arousal/Alertness: Awake/alert ?Behavior During Therapy: [facility] for tasks assessed/performed ?Overall Cognitive Status: Within Functional Limits for tasks assessed ?  ?  ?  ?  ?  ?  ?  ?  ?  ?  ?  ?  ?  ?  ?  ?  ?  ?  ?  ? ?  ?General Comments General comments (skin integrity, edema, etc.): HR 90-110s sitting at rest upon arrival with intermittent jump up to 140s but then recover to resting rate again; while ambulating pt's HR jumped up to 170s and varied greatly; once sitting in recliner HR up to low 200s then recovered to 90-110s with intermittent jumps back up to 150-180s then again returned to resting rate; pt asymptomic throughout ? ?  ?Exercises    ? ?Assessment/Plan  ?  ?PT Assessment Patient needs continued PT services  ?PT Problem List  Decreased strength;Decreased activity tolerance;Decreased balance;Decreased mobility;Cardiopulmonary status limiting activity ? ?   ?  ?PT Treatment Interventions DME instruction;Gait training;Stair training;Functional mobility training;Therapeutic activities;Therapeutic exercise;Balance training;Neuromuscular re-education;Patient/family education   ? ?PT Goals (Current goals can be found in the Care Plan section)  ?Acute Rehab PT Goals ?Patient Stated Goal: to get better ?PT Goal Formulation: With patient ?Time For Goal Achievement: 07/27/21 ?Potential to Achieve Goals: Good ? ?  ?Frequency Min 3X/week ?  ? ? ?Co-evaluation   ?  ?  ?  ?  ? ? ?  ?AM-PAC PT "6 Clicks" Mobility  ?Outcome Measure Help needed turning from your back to your side while in a flat bed without using bedrails?: A Little ?Help needed moving from lying on your back to sitting on the side of a flat bed without using bedrails?: A Little ?Help needed moving to and from a bed to a chair (including a wheelchair)?: A Little ?Help needed standing up from a chair using your arms (e.g., wheelchair or bedside chair)?: A Little ?Help  needed to walk in hospital room?: Total (limited by elevated HR) ?Help needed climbing 3-5 steps with a railing? : Total (limited by elevated HR) ?6 Click Score: 14 ? ?  ?End of Session   ?Activity Tolerance: Other (comment) (limited by elevated HR) ?Patient left: in chair;Other (comment) (with OT) ?Nurse Communication: Mobility status;Other (comment) (HR) ?PT Visit Diagnosis: Unsteadiness on feet (R26.81);Other abnormalities of gait and mobility (R26.89);Muscle weakness (generalized) (M62.81);Difficulty in walking, not elsewhere classified (R26.2) ?  ? ?Time: 1517-6160 ?PT Time Calculation (min) (ACUTE ONLY): 14 min ? ? ?Charges:   PT Evaluation ?$PT Eval Moderate Complexity: 1 Mod ?  ?  ?   ? ? ?Karen West, PT, DPT ?Acute Rehabilitation Services  ?Pager: [phone] ?Office: [phone] ? ? ?Karen West ?07/13/2021,  11:42 AM ? ?

## 2021-07-13 NOTE — Progress Notes (Signed)
? ?  Milrinone stopped earlier due to hypotension and A fib RVR.  ? ?Converted to SR with amio load.  ? ?CO-OX now 43%. Will need to restart milrinone 0.125 mcg.  ? ?Angeldejesus Callaham NP-C  ?4:50 PM ? ?

## 2021-07-13 NOTE — Progress Notes (Signed)
PT Cancellation Note ? ?Patient Details ?Name: Karen West ?MRN: [id number] ?DOB: 1940/06/24 ? ? ?Cancelled Treatment:    Reason Eval/Treat Not Completed: Patient not medically ready (Found to have acute L LE DVT; started heparin at 3:30pm 4/24.  PT to wait 24 hrs after heparin administration for safety.  F/u in PM if time permits.) ? ?Khali Albanese A. Dequann Vandervelden, PT, DPT ?Acute Rehabilitation Services ?Office: [phone]  ?[place] ?07/13/2021, 8:41 AM ?

## 2021-07-13 NOTE — Progress Notes (Signed)
?  Echocardiogram ?2D Echocardiogram has been performed. ? ?Karen West ?07/13/2021, 3:03 PM ?

## 2021-07-13 NOTE — Progress Notes (Signed)
2D echo attempted, but heart rate too high in 140's. RN stated to try later.  ?

## 2021-07-13 NOTE — Progress Notes (Addendum)
? ? Advanced Heart Failure Rounding Note ? ?PCP-Cardiologist: Minus Breeding, MD  ?AHF: Dr. Haroldine Laws  ? ?Subjective:   ? ?4/25: Admitted w/ a/c CHF w/ low output. PICC placed. Initial Co-ox 57%. Started on milrinone 0.25 + IV Lasix.   ?LE Venous Dopplers + for left DVT. On heparin gtt.  ? ?Co-ox lower today, 55%. Strict I/Os not recorded. No wt charted yesterday for comparison. She reports UOP yesterday was not robust but she feels slightly better today w/ less dyspnea. CVP 14 ? ?SCr higher, 2.08>>2.33 (BL ~2.1). CO2 19. LFT down-trending. SBPs low 100s-110s. NSR w/ PVCs on tele.  ? ? ?Objective:   ?Weight Range: ?65.9 kg ?Body mass index is 27.45 kg/m?.  ? ?Vital Signs:   ?Temp:  [96.6 ?F (35.9 ?C)-97.8 ?F (36.6 ?C)] 97.7 ?F (36.5 ?C) (04/26 0500) ?Pulse Rate:  [78-88] 88 (04/26 0500) ?Resp:  [17-20] 20 (04/26 0500) ?BP: (121-141)/(60-91) 122/60 (04/26 0500) ?SpO2:  [99 %-100 %] 100 % (04/26 0500) ?Weight:  [65.9 kg] 65.9 kg (04/26 0500) ?Last BM Date : 07/12/21 ? ?Weight change: ?Filed Weights  ? 07/13/21 0500  ?Weight: 65.9 kg  ? ? ?Intake/Output:  ? ?Intake/Output Summary (Last 24 hours) at 07/13/2021 9485 ?Last data filed at 07/13/2021 0200 ?Gross per 24 hour  ?Intake 153.17 ml  ?Output --  ?Net 153.17 ml  ?  ? ? ?Physical Exam  ?  ?CVP 14  ?General:  fatigued appearing, laying in bed. No resp difficulty ?HEENT: Normal ?Neck: Supple. JVP elevated to jaw . Carotids 2+ bilat; no bruits. No lymphadenopathy or thyromegaly appreciated. ?Cor: PMI nondisplaced. Irregular rhythm (PVCs) 3/6 MR murmur  ?Lungs: decreased BS at the bases bilaterally  ?Abdomen: Soft, nontender, nondistended. No hepatosplenomegaly. No bruits or masses. Good bowel sounds. ?Extremities: No cyanosis, clubbing, rash, edema, distal extremities warm,  + RUE PICC, ?Neuro: Alert & orientedx3, cranial nerves grossly intact. moves all 4 extremities w/o difficulty. Affect pleasant ? ? ?Telemetry  ? ?NSR 90s w/ occasional PVCs  ? ?EKG  ?  ?No new EKG  to review  ? ?Labs  ?  ?CBC ?Recent Labs  ?  07/12/21 ?0431 07/13/21 ?4627  ?WBC 11.5* 9.3  ?NEUTROABS 9.8* 7.6  ?HGB 10.3* 9.4*  ?HCT 32.6* 29.9*  ?MCV 92.9 91.7  ?PLT 178 187  ? ?Basic Metabolic Panel ?Recent Labs  ?  07/12/21 ?0431 07/12/21 ?1059 07/13/21 ?0507  ?NA 138 137 137  ?K 2.8* 3.3* 4.0  ?CL 107 106 106  ?CO2 19* 17* 19*  ?GLUCOSE 108* 128* 188*  ?BUN 73* 72* 77*  ?CREATININE 2.07* 2.08* 2.33*  ?CALCIUM 8.1* 8.1* 7.9*  ?MG 1.9  --  2.2  ?PHOS 5.2*  --   --   ? ?Liver Function Tests ?Recent Labs  ?  07/12/21 ?0431 07/13/21 ?0507  ?AST 92* 80*  ?ALT 413* 319*  ?ALKPHOS 121 140*  ?BILITOT 1.5* 1.6*  ?PROT 6.7 6.7  ?ALBUMIN 2.6* 2.5*  ? ?Recent Labs  ?  07/11/21 ?2035 07/12/21 ?0431  ?LIPASE 68* 59*  ? ?Cardiac Enzymes ?No results for input(s): CKTOTAL, CKMB, CKMBINDEX, TROPONINI in the last 72 hours. ? ?BNP: ?BNP (last 3 results) ?Recent Labs  ?  04/27/21 ?0350 06/17/21 ?0957 06/29/21 ?1818  ?BNP 1,077.8* 2,771.3* 3,309.2*  ? ? ?ProBNP (last 3 results) ?No results for input(s): PROBNP in the last 8760 hours. ? ? ?D-Dimer ?No results for input(s): DDIMER in the last 72 hours. ?Hemoglobin A1C ?No results for input(s): HGBA1C in the last 72  hours. ?Fasting Lipid Panel ?No results for input(s): CHOL, HDL, LDLCALC, TRIG, CHOLHDL, LDLDIRECT in the last 72 hours. ?Thyroid Function Tests ?No results for input(s): TSH, T4TOTAL, T3FREE, THYROIDAB in the last 72 hours. ? ?Invalid input(s): FREET3 ? ?Other results: ? ? ?Imaging  ? ? ?VAS [place] LOWER EXTREMITY VENOUS (DVT) ? ?Result Date: 07/12/2021 ? Lower Venous DVT Study Patient Name:  Karen West  Date of Exam:   07/12/2021 Medical Rec #: [id number]         Accession #:    [id number] Date of Birth: [birth year]          Patient Gender: F Patient Age:   81 years Exam Location:  [facility] Procedure:      VAS [place] LOWER EXTREMITY VENOUS (DVT) Referring Phys: Archie Patten HALL --------------------------------------------------------------------------------  Indications:  Left leg swelling.  Comparison Study: No prior studies. Performing Technologist: Darlin Coco RDMS, RVT  Examination Guidelines: A complete evaluation includes B-mode imaging, spectral Doppler, color Doppler, and power Doppler as needed of all accessible portions of each vessel. Bilateral testing is considered an integral part of a complete examination. Limited examinations for reoccurring indications may be performed as noted. The reflux portion of the exam is performed with the patient in reverse Trendelenburg.  +-----+---------------+---------+-----------+----------+--------------+ RIGHTCompressibilityPhasicitySpontaneityPropertiesThrombus Aging +-----+---------------+---------+-----------+----------+--------------+ CFV  Full           Yes      Yes                                 +-----+---------------+---------+-----------+----------+--------------+   +---------+---------------+---------+-----------+----------+--------------+ LEFT     CompressibilityPhasicitySpontaneityPropertiesThrombus Aging +---------+---------------+---------+-----------+----------+--------------+ CFV      Full           Yes      Yes                                 +---------+---------------+---------+-----------+----------+--------------+ SFJ      Full                                                        +---------+---------------+---------+-----------+----------+--------------+ FV Prox  Full                                                        +---------+---------------+---------+-----------+----------+--------------+ FV Mid   Full                                                        +---------+---------------+---------+-----------+----------+--------------+ FV DistalFull                                                        +---------+---------------+---------+-----------+----------+--------------+ PFV      Full                                                         +---------+---------------+---------+-----------+----------+--------------+  POP      Full           Yes      Yes                                 +---------+---------------+---------+-----------+----------+--------------+ PTV      None           No       No                   Acute          +---------+---------------+---------+-----------+----------+--------------+ PERO     Full                                                        +---------+---------------+---------+-----------+----------+--------------+     Summary: RIGHT: - No evidence of common femoral vein obstruction.  LEFT: - Findings consistent with acute deep vein thrombosis involving a single paired posterior tibial vein. - No cystic structure found in the popliteal fossa.  *See table(s) above for measurements and observations. Electronically signed by Servando Snare MD on 07/12/2021 at 4:00:10 PM.    Final   ? ?[place] EKG SITE RITE ? ?Result Date: 07/12/2021 ?If [employer] not attached, placement could not be confirmed due to current cardiac rhythm.  ? ? ?Medications:   ? ? ?Scheduled Medications: ? aspirin EC  81 mg Oral Daily  ? Chlorhexidine Gluconate Cloth  6 each Topical Daily  ? furosemide  80 mg Intravenous BID  ? insulin aspart  0-5 Units Subcutaneous QHS  ? insulin aspart  0-9 Units Subcutaneous TID WC  ? potassium chloride  40 mEq Oral BID  ? senna-docusate  2 tablet Oral QHS  ? sodium chloride flush  10-40 mL Intracatheter Q12H  ? ticagrelor  90 mg Oral BID  ? ? ?Infusions: ? heparin 900 Units/hr (07/13/21 4481)  ? milrinone 0.25 mcg/kg/min (07/12/21 2206)  ? ? ?PRN Medications: ?HYDROmorphone (DILAUDID) injection, melatonin, oxyCODONE, polyethylene glycol, prochlorperazine, sodium chloride flush ? ? ? ?Patient Profile  ? ?81 y.o. female with CAD s/p recent inferolateral STEMI 2/23 w/ PCI to the RCA, severe (suspected infarct related) mitral regurgitation not candidate for TEER and being considered for Percutaneous MV  re[facility] at [facility], chronic systolic heart failure w/ EF 35-40%, Stage IV CKD, HTN, anemia, DM and sleep apnea.  ? ? ?Admitted with increased dyspnea/abdominal pain in the setting of A/C HFrEF and suspected low output and AKI on CKD. Also found to have Lt LE DVT.  ? ?Assessment/Plan  ? ?

## 2021-07-13 NOTE — Progress Notes (Signed)
ANTICOAGULATION CONSULT NOTE ?Pharmacy Consult for heparin  ?Indication: DVT ?Brief A/P: Heparin level supratherapeutic Decrease Heparin rate ? ?Allergies  ?Allergen Reactions  ? Tramadol Nausea And Vomiting and Other (See Comments)  ?  "got sick to my stomach and was throwing up blood; it put me in the hospital")  ? Lisinopril Rash and Other (See Comments)  ?  Renal failure ?  ? Rosuvastatin Other (See Comments)  ?  Other reaction(s): rhabdo  ? Tapazole [Methimazole] Itching and Rash  ? ? ?Vital Signs: ?Temp: 97.7 ?F (36.5 ?C) (04/26 0500) ?Temp Source: Axillary (04/26 0500) ?BP: 122/60 (04/26 0500) ?Pulse Rate: 88 (04/26 0500) ? ?Labs: ?Recent Labs  ?  07/11/21 ?1520 07/11/21 ?1722 07/11/21 ?2035 07/12/21 ?0050 07/12/21 ?0431 07/12/21 ?1059 07/13/21 ?0507  ?HGB 11.2*  --   --   --  10.3*  --   --   ?HCT 36.8  --   --   --  32.6*  --   --   ?PLT 202  --   --   --  178  --   --   ?HEPARINUNFRC  --   --   --   --   --   --  1.08*  ?CREATININE 2.34*  --   --   --  2.07* 2.08*  --   ?TROPONINIHS 81* 71* 87* 60*  --   --   --   ? ? ? ?Estimated Creatinine Clearance: 19.4 mL/min (A) (by C-G formula based on SCr of 2.08 mg/dL (H)). ? ?Assessment: ?81 y.o. female with LLE DVT for heparin ? ?Goal of Therapy:  ?Heparin level 0.3-0.7 units/ml ?Monitor platelets by anticoagulation protocol: Yes ?  ?Plan:  ?Decrease Heparin 900 units/hr ?Check heparin level in 8 hours. ? ?Phillis Knack, PharmD, BCPS ? ? ? ? ?

## 2021-07-13 NOTE — Progress Notes (Signed)
ANTICOAGULATION CONSULT NOTE - Follow Up Consult ? ?Pharmacy Consult for Heparin ?Indication: LLE DVT ? ?Allergies  ?Allergen Reactions  ? Tramadol Nausea And Vomiting and Other (See Comments)  ?  "got sick to my stomach and was throwing up blood; it put me in the hospital")  ? Lisinopril Rash and Other (See Comments)  ?  Renal failure ?  ? Rosuvastatin Other (See Comments)  ?  Other reaction(s): rhabdo  ? Tapazole [Methimazole] Itching and Rash  ? ? ?Patient Measurements: ?Height: 5\' 1"  (154.9 cm) ?Weight: 65.9 kg (145 lb 4.5 oz) ?IBW/kg (Calculated) : 47.8 ?Heparin Dosing Weight: 62 kg ? ?Vital Signs: ?Temp: 98.2 ?F (36.8 ?C) (04/26 1610) ?Temp Source: Oral (04/26 9604) ?BP: 101/57 (04/26 0824) ?Pulse Rate: 84 (04/26 0824) ? ?Labs: ?Recent Labs  ?  07/11/21 ?1520 07/11/21 ?1722 07/11/21 ?2035 07/12/21 ?0050 07/12/21 ?0431 07/12/21 ?1059 07/13/21 ?0507 07/13/21 ?5409 07/13/21 ?1530  ?HGB 11.2*  --   --   --  10.3*  --   --  9.4*  --   ?HCT 36.8  --   --   --  32.6*  --   --  29.9*  --   ?PLT 202  --   --   --  178  --   --  187  --   ?HEPARINUNFRC  --   --   --   --   --   --  1.08*  --  0.65  ?CREATININE 2.34*  --   --   --  2.07* 2.08* 2.33*  --   --   ?TROPONINIHS 81* 71* 87* 60*  --   --   --   --   --   ? ? ?Estimated Creatinine Clearance: 17.3 mL/min (A) (by C-G formula based on SCr of 2.33 mg/dL (H)). ? ?Assessment: ?81 yo female with LLE DVT per duplex and started IV heparin on 07/12/21. No anticoagulants noted PTA but noted on ticagrelor and DES placed 04/23/21.  Had received Enoxaparin 30 mg x 1 on 4/25 at ~9am. ? ?  Initial heparin level supratherapeutic (1.08) on 1100 units/hr and infusion rate decreased to 900 units/hr ~6am today.  Heparin level is now therapeutic (0.65). ? ?Goal of Therapy:  ?Heparin level 0.3-0.7 units/ml ?Monitor platelets by anticoagulation protocol: Yes ?  ?Plan:  ?Continue heparin drip at 900 units/hr ?Next heparin level and CBC in am. ? ?Arty Baumgartner, RPh ?07/13/2021,5:19  PM ? ? ?

## 2021-07-14 DIAGNOSIS — I5023 Acute on chronic systolic (congestive) heart failure: Secondary | ICD-10-CM | POA: Diagnosis not present

## 2021-07-14 DIAGNOSIS — R079 Chest pain, unspecified: Secondary | ICD-10-CM | POA: Diagnosis not present

## 2021-07-14 LAB — BASIC METABOLIC PANEL
Anion gap: 13 (ref 5–15)
BUN: 79 mg/dL — ABNORMAL HIGH (ref 8–23)
CO2: 17 mmol/L — ABNORMAL LOW (ref 22–32)
Calcium: 7.6 mg/dL — ABNORMAL LOW (ref 8.9–10.3)
Chloride: 100 mmol/L (ref 98–111)
Creatinine, Ser: 2.48 mg/dL — ABNORMAL HIGH (ref 0.44–1.00)
GFR, Estimated: 19 mL/min — ABNORMAL LOW (ref >60)
Glucose, Bld: 231 mg/dL — ABNORMAL HIGH (ref 70–99)
Potassium: 4.7 mmol/L (ref 3.5–5.1)
Sodium: 130 mmol/L — ABNORMAL LOW (ref 135–145)

## 2021-07-14 LAB — COOXEMETRY PANEL
Carboxyhemoglobin: 1.2 % (ref 0.5–1.5)
Methemoglobin: <0.7 % (ref 0.0–1.5)
O2 Saturation: 64.8 %
Total hemoglobin: 9.8 g/dL — ABNORMAL LOW (ref 12.0–16.0)

## 2021-07-14 LAB — HEPARIN LEVEL (UNFRACTIONATED)
Heparin Unfractionated: >1.1 IU/mL — ABNORMAL HIGH (ref 0.30–0.70)
Heparin Unfractionated: 0.51 IU/mL (ref 0.30–0.70)

## 2021-07-14 LAB — ECHOCARDIOGRAM COMPLETE
AR max vel: 1.7 cm2
AV Area VTI: 1.59 cm2
AV Area mean vel: 1.53 cm2
AV Mean grad: 5 mmHg
AV Peak grad: 8.8 mmHg
Ao pk vel: 1.48 m/s
Area-P 1/2: 4.68 cm2
Calc EF: 40.1 %
MV M vel: 4.54 m/s
MV Peak grad: 82.3 mmHg
MV VTI: 1.31 cm2
P 1/2 time: 292 msec
Radius: 0.95 cm
S' Lateral: 4.9 cm
Single Plane A2C EF: 43.1 %
Single Plane A4C EF: 45.5 %
Weight: 2324.53 oz

## 2021-07-14 LAB — GLUCOSE, CAPILLARY
Glucose-Capillary: 179 mg/dL — ABNORMAL HIGH (ref 70–99)
Glucose-Capillary: 193 mg/dL — ABNORMAL HIGH (ref 70–99)
Glucose-Capillary: 230 mg/dL — ABNORMAL HIGH (ref 70–99)
Glucose-Capillary: 177 mg/dL — ABNORMAL HIGH (ref 70–99)

## 2021-07-14 LAB — CBC
HCT: 29.2 % — ABNORMAL LOW (ref 36.0–46.0)
Hemoglobin: 9.3 g/dL — ABNORMAL LOW (ref 12.0–15.0)
MCH: 29.2 pg (ref 26.0–34.0)
MCHC: 31.8 g/dL (ref 30.0–36.0)
MCV: 91.5 fL (ref 80.0–100.0)
Platelets: 194 10*3/uL (ref 150–400)
RBC: 3.19 MIL/uL — ABNORMAL LOW (ref 3.87–5.11)
RDW: 21.9 % — ABNORMAL HIGH (ref 11.5–15.5)
WBC: 9.3 10*3/uL (ref 4.0–10.5)
nRBC: 1.8 % — ABNORMAL HIGH (ref 0.0–0.2)

## 2021-07-14 MED ORDER — HYDRALAZINE HCL 25 MG PO TABS
12.5000 mg | ORAL_TABLET | Freq: Three times a day (TID) | ORAL | Status: DC
  Administered 2021-07-14 – 2021-07-16 (×6): 12.5 mg via ORAL
  Filled 2021-07-14 (×6): qty 1

## 2021-07-14 MED ORDER — METOLAZONE 5 MG PO TABS
2.5000 mg | ORAL_TABLET | Freq: Once | ORAL | Status: AC
  Administered 2021-07-14: 2.5 mg via ORAL
  Filled 2021-07-14: qty 1

## 2021-07-14 NOTE — Progress Notes (Signed)
ANTICOAGULATION CONSULT NOTE - Follow Up Consult ? ?Pharmacy Consult for Heparin ?Indication: LLE DVT / Afib ? ?Allergies  ?Allergen Reactions  ? Tramadol Nausea And Vomiting and Other (See Comments)  ?  "got sick to my stomach and was throwing up blood; it put me in the hospital")  ? Lisinopril Rash and Other (See Comments)  ?  Renal failure ?  ? Rosuvastatin Other (See Comments)  ?  Other reaction(s): rhabdo  ? Tapazole [Methimazole] Itching and Rash  ? ? ?Patient Measurements: ?Height: 5\' 1"  (154.9 cm) ?Weight: 65.9 kg (145 lb 4.5 oz) ?IBW/kg (Calculated) : 47.8 ?Heparin Dosing Weight: 62 kg ? ?Vital Signs: ?Temp: 97.5 ?F (36.4 ?C) (04/27 1123) ?Temp Source: Oral (04/27 1123) ?BP: 165/132 (04/27 1123) ?Pulse Rate: 100 (04/27 1213) ? ?Labs: ?Recent Labs  ?  07/11/21 ?1722 07/11/21 ?2035 07/12/21 ?0050 07/12/21 ?0431 07/12/21 ?0431 07/12/21 ?1059 07/13/21 ?0507 07/13/21 ?2778 07/13/21 ?1530 07/14/21 ?0350 07/14/21 ?0435  ?HGB  --   --   --  10.3*  --   --   --  9.4*  --  9.3*  --   ?HCT  --   --   --  32.6*  --   --   --  29.9*  --  29.2*  --   ?PLT  --   --   --  178  --   --   --  187  --  194  --   ?HEPARINUNFRC  --   --   --   --    < >  --  1.08*  --  0.65 >1.10* 0.51  ?CREATININE  --   --   --  2.07*  --  2.08* 2.33*  --   --  2.48*  --   ?TROPONINIHS 71* 87* 60*  --   --   --   --   --   --   --   --   ? < > = values in this interval not displayed.  ? ? ? ?Estimated Creatinine Clearance: 16.2 mL/min (A) (by C-G formula based on SCr of 2.48 mg/dL (H)). ? ?Assessment: ?81 yo female with LLE DVT per duplex and started IV heparin on 07/12/21. No anticoagulants noted PTA but noted on ticagrelor and DES placed 04/23/21.  Had received Enoxaparin 30 mg x 1 on 4/25 at ~9am. ?Heparin drip 900 uts/hr running through PICC line with restricted arm access, monitor for accuracy of heparin levels  ?Heparin level 0.5 this am with redraw ?Cbc stable no bleeding noted  ? ?Goal of Therapy:  ?Heparin level 0.3-0.7 units/ml ?Monitor  platelets by anticoagulation protocol: Yes ?  ?Plan:  ?Continue heparin drip at 900 units/hr ?Next heparin level and CBC in am. ? ? ? ?Bonnita Nasuti Pharm.D. CPP, BCPS ?Clinical Pharmacist ?[phone] ?07/14/2021 12:19 PM  ? ? ? ?

## 2021-07-14 NOTE — Progress Notes (Signed)
Physical Therapy Treatment ?Patient Details ?Name: Karen West ?MRN: [id number] ?DOB: [birth year] ?Today's Date: 07/14/2021 ? ? ?History of Present Illness Pt is 81 yr old F admitted on 07/11/21 with c/o chest pain, admitted with A/C HFrEf. Found to have acute DVT L LE. PMH: breast CA, chest pain, anemia, CAD, HTN, hyperthyroid, MI, OSA, OA, R BBB, DMII, NSTEMI, CKD ? ?  ?PT Comments  ? ? Pt pleasant and able to tolerate increased mobility this session with HR 84-100 with limited gait. Pt reports family can provide intermittent assist and she was educated for HEP and repeated sit to stands. Pt encouraged to continue to be up for toileting and to elevate legs with edema. Will continue to follow.  ? ?Hr 84-100 ?95% on RA ?   ?Recommendations for follow up therapy are one component of a multi-disciplinary discharge planning process, led by the attending physician.  Recommendations may be updated based on patient status, additional functional criteria and insurance authorization. ? ?Follow Up Recommendations ? Home health PT ?  ?  ?Assistance Recommended at Discharge Intermittent Supervision/Assistance  ?Patient can return home with the following Assistance with cooking/housework;Assist for transportation;Help with stairs or ramp for entrance ?  ?Equipment Recommendations ? Rollator (4 wheels)  ?  ?Recommendations for Other Services   ? ? ?  ?Precautions / Restrictions Precautions ?Precautions: Fall;Other (comment) ?Precaution Comments: watch HR  ?  ? ?Mobility ? Bed Mobility ?  ?  ?  ?  ?  ?  ?  ?General bed mobility comments: pt in chair on arrival and end of session ?  ? ?Transfers ?Overall transfer level: Needs assistance ?  ?Transfers: Sit to/from Stand ?Sit to Stand: Min guard ?  ?  ?  ?  ?  ?General transfer comment: cues for hand placement for safety and pt able to perform 5 repeated sit to stands in 40 sec ?  ? ?Ambulation/Gait ?Ambulation/Gait assistance: Min guard ?Gait Distance (Feet): 60 Feet ?Assistive  device: Rolling walker (2 wheels) ?Gait Pattern/deviations: Step-through pattern, Decreased stride length, Trunk flexed ?  ?Gait velocity interpretation: <1.8 ft/sec, indicate of risk for recurrent falls ?  ?General Gait Details: cues for posture and proximity to RW with pt able to self regulate distance and limited by fatigue ? ? ?Stairs ?  ?  ?  ?  ?  ? ? ?Wheelchair Mobility ?  ? ?Modified Rankin (Stroke Patients Only) ?  ? ? ?  ?Balance Overall balance assessment: Mild deficits observed, not formally tested ?  ?  ?  ?  ?  ?  ?  ?  ?  ?  ?  ?  ?  ?  ?  ?  ?  ?  ?  ? ?  ?Cognition Arousal/Alertness: Awake/alert ?Behavior During Therapy: Karen West for tasks assessed/performed ?Overall Cognitive Status: Within Functional Limits for tasks assessed ?  ?  ?  ?  ?  ?  ?  ?  ?  ?  ?  ?  ?  ?  ?  ?  ?  ?  ?  ? ?  ?Exercises General Exercises - Lower Extremity ?Long Arc Quad: AROM, Both, Seated, 20 reps ?Hip Flexion/Marching: AROM, Both, 20 reps, Seated ? ?  ?General Comments   ?  ?  ? ?Pertinent Vitals/Pain Pain Assessment ?Faces Pain Scale: Hurts little more ?Pain Location: bil feet ?Pain Descriptors / Indicators: Aching, Sore ?Pain Intervention(s): Limited activity within patient's tolerance, Monitored during session, Repositioned  ? ? ?Home Living   ?  ?  ?  ?  ?  ?  ?  ?  ?  ?   ?  ?  Prior Function    ?  ?  ?   ? ?PT Goals (current goals can now be found in the care plan section) Progress towards PT goals: Progressing toward goals ? ?  ?Frequency ? ? ? Min 3X/week ? ? ? ?  ?PT Plan Current plan remains appropriate  ? ? ?Co-evaluation   ?  ?  ?  ?  ? ?  ?AM-PAC PT "6 Clicks" Mobility   ?Outcome Measure ? Help needed turning from your back to your side while in a flat bed without using bedrails?: A Little ?Help needed moving from lying on your back to sitting on the side of a flat bed without using bedrails?: A Little ?Help needed moving to and from a bed to a chair (including a wheelchair)?: A Little ?Help needed standing up  from a chair using your arms (e.g., wheelchair or bedside chair)?: A Little ?Help needed to walk in West room?: A Little ?Help needed climbing 3-5 steps with a railing? : A Lot ?6 Click Score: 17 ? ?  ?End of Session   ?Activity Tolerance: Patient tolerated treatment well ?Patient left: in chair;with call bell/phone within reach ?Nurse Communication: Mobility status ?PT Visit Diagnosis: Unsteadiness on feet (R26.81);Other abnormalities of gait and mobility (R26.89);Muscle weakness (generalized) (M62.81);Difficulty in walking, not elsewhere classified (R26.2) ?  ? ? ?Time: 7564-3329 ?PT Time Calculation (min) (ACUTE ONLY): 28 min ? ?Charges:  $Gait Training: 8-22 mins ?$Therapeutic Exercise: 8-22 mins          ?          ? ?Karen West, PT ?Acute Rehabilitation Services ?Pager: [phone] ?Office: [phone] ? ? ? ?Karen West ?07/14/2021, 12:19 PM ? ?

## 2021-07-14 NOTE — Progress Notes (Signed)
Inpatient Diabetes Program Recommendations ? ?AACE/ADA: New Consensus Statement on Inpatient Glycemic Control (2015) ? ?Target Ranges:  Prepandial:   less than 140 mg/dL ?     Peak postprandial:   less than 180 mg/dL (1-2 hours) ?     Critically ill patients:  140 - 180 mg/dL  ? ?Lab Results  ?Component Value Date  ? GLUCAP 193 (H) 07/14/2021  ? HGBA1C 6.0 (H) 04/25/2021  ? ? ?Review of Glycemic Control ? Latest Reference Range & Units 07/13/21 21:59 07/14/21 07:43 07/14/21 11:22  ?Glucose-Capillary 70 - 99 mg/dL 335 (H) 179 (H) 193 (H)  ? ?Diabetes history: DM 2 ?Outpatient Diabetes medications:  ?Jardiance 10 mg daily ?Tresiba 14 units daily ?Tradjenta 5 mg daily ?Current orders for Inpatient glycemic control:  ?Novolog 0-9 units tid with meals and HS ?Semglee 10 units daily ? ?Inpatient Diabetes Program Recommendations:   ?Agree with orders.  Will follow.  ? ?Thanks,  ?Adah Perl, RN, BC-ADM ?Inpatient Diabetes Coordinator ?Pager [phone]  (8a-5p) ? ? ?

## 2021-07-14 NOTE — Progress Notes (Signed)
Pt refusing cpap for the night. ?

## 2021-07-14 NOTE — Progress Notes (Addendum)
? ? Advanced Heart Failure Rounding Note ? ?PCP-Cardiologist: Minus Breeding, MD  ?AHF: Dr. Haroldine Laws  ? ?Subjective:   ? ?4/25: Admitted w/ a/c CHF w/ low output. PICC placed. Initial Co-ox 57%. Started on milrinone 0.25 + IV Lasix.   ?LE Venous Dopplers + for left DVT. On heparin gtt.  ?4/26 A fib RVR. Loaded on amio. Transient hypotension. Milrinone stopped. CO-OX 43%. Milrinone 0.125 mcg restarted  ? ?Currently on amio drip and milrinone 0.125 mcg. CO-OX 65%.  ? ?Diuresing with IV lasix. Output not recorded (no foley).  ? ?Creatinine 2.08>2.3>2.5 ? ?Complaining of leg edema.  ? ? ?Objective:   ?Weight Range: ?65.9 kg ?Body mass index is 27.45 kg/m?.  ? ?Vital Signs:   ?Temp:  [97.4 ?F (36.3 ?C)-98.3 ?F (36.8 ?C)] 97.5 ?F (36.4 ?C) (04/27 1123) ?Pulse Rate:  [73-82] 74 (04/27 1123) ?Resp:  [17-22] 18 (04/27 1123) ?BP: (110-167)/(53-132) 165/132 (04/27 1123) ?SpO2:  [94 %-100 %] 97 % (04/27 1123) ?Last BM Date : 07/12/21 ? ?Weight change: ?Filed Weights  ? 07/13/21 0500  ?Weight: 65.9 kg  ? ? ?Intake/Output:  ?No intake or output data in the 24 hours ending 07/14/21 1147 ?  ? ? ?Physical Exam  ?  ?CVP 14  ?General:  In the chair. No resp difficulty ?HEENT: normal ?Neck: supple. JVP to jaw.  Carotids 2+ bilat; no bruits. No lymphadenopathy or thryomegaly appreciated. ?Cor: PMI nondisplaced. Regular rate & rhythm. No rubs, gallops or murmurs. ?Lungs: clear ?Abdomen: soft, nontender, nondistended. No hepatosplenomegaly. No bruits or masses. Good bowel sounds. ?Extremities: no cyanosis, clubbing, rash, R and LLE 1+ edema. RUE PICC ?Neuro: alert & orientedx3, cranial nerves grossly intact. moves all 4 extremities w/o difficulty. Affect pleasant ? ? ?Telemetry  ?SR 70-80s with PVCs < 10 per hour  ? ?EKG  ?  ?No new EKG to review  ? ?Labs  ?  ?CBC ?Recent Labs  ?  07/12/21 ?0431 07/13/21 ?7628 07/14/21 ?0350  ?WBC 11.5* 9.3 9.3  ?NEUTROABS 9.8* 7.6  --   ?HGB 10.3* 9.4* 9.3*  ?HCT 32.6* 29.9* 29.2*  ?MCV 92.9 91.7 91.5   ?PLT 178 187 194  ? ?Basic Metabolic Panel ?Recent Labs  ?  07/12/21 ?0431 07/12/21 ?1059 07/13/21 ?0507 07/14/21 ?0350  ?NA 138   < > 137 130*  ?K 2.8*   < > 4.0 4.7  ?CL 107   < > 106 100  ?CO2 19*   < > 19* 17*  ?GLUCOSE 108*   < > 188* 231*  ?BUN 73*   < > 77* 79*  ?CREATININE 2.07*   < > 2.33* 2.48*  ?CALCIUM 8.1*   < > 7.9* 7.6*  ?MG 1.9  --  2.2  --   ?PHOS 5.2*  --   --   --   ? < > = values in this interval not displayed.  ? ?Liver Function Tests ?Recent Labs  ?  07/12/21 ?0431 07/13/21 ?0507  ?AST 92* 80*  ?ALT 413* 319*  ?ALKPHOS 121 140*  ?BILITOT 1.5* 1.6*  ?PROT 6.7 6.7  ?ALBUMIN 2.6* 2.5*  ? ?Recent Labs  ?  07/11/21 ?2035 07/12/21 ?0431  ?LIPASE 68* 59*  ? ?Cardiac Enzymes ?No results for input(s): CKTOTAL, CKMB, CKMBINDEX, TROPONINI in the last 72 hours. ? ?BNP: ?BNP (last 3 results) ?Recent Labs  ?  04/27/21 ?3151 06/17/21 ?0957 06/29/21 ?1818  ?BNP 1,077.8* 2,771.3* 3,309.2*  ? ? ?ProBNP (last 3 results) ?No results for input(s): PROBNP in the last  8760 hours. ? ? ?D-Dimer ?No results for input(s): DDIMER in the last 72 hours. ?Hemoglobin A1C ?No results for input(s): HGBA1C in the last 72 hours. ?Fasting Lipid Panel ?No results for input(s): CHOL, HDL, LDLCALC, TRIG, CHOLHDL, LDLDIRECT in the last 72 hours. ?Thyroid Function Tests ?No results for input(s): TSH, T4TOTAL, T3FREE, THYROIDAB in the last 72 hours. ? ?Invalid input(s): FREET3 ? ?Other results: ? ? ?Imaging  ? ? ?ECHOCARDIOGRAM COMPLETE ? ?Result Date: 07/14/2021 ?   ECHOCARDIOGRAM REPORT   Patient Name:   Karen West Date of Exam: 07/13/2021 Medical Rec #:  [id number]        Height:       61.0 in Accession #:    [id number]       Weight:       145.3 lb Date of Birth:  02-20-1941         BSA:          1.649 m? Patient Age:    81 years         BP:           101/57 mmHg Patient Gender: F                HR:           77 bpm. Exam Location:  Inpatient Procedure: 2D Echo, 3D Echo, Cardiac Doppler and Color Doppler Indications:    I50.20*  Unspecified systolic (congestive) heart failure  History:        Patient has prior history of Echocardiogram examinations, most                 recent 05/16/2021. CHF, CAD and Previous Myocardial Infarction,                 Abnormal ECG, Mitral Valve Disease, Signs/Symptoms:Chest Pain;                 Risk Factors:Diabetes, Sleep Apnea, Dyslipidemia and                 Hypertension. Cancer. Mitral regurgitation.  Sonographer:    Roseanna Rainbow RDCS Referring Phys: [place] Comments: Technically difficult study due to poor echo windows. Image acquisition challenging due to mastectomy. Patient in high fowler's position due to dyspnea and heart rate. IMPRESSIONS  1. Posterolateral and basal to mid inferoseptal hypokinesis. Inferior akinesis. Left ventricular ejection fraction, by estimation, is 35 to 40%. The left ventricle has moderately decreased function. The left ventricle demonstrates regional wall motion abnormalities (see scoring diagram/findings for description). There is mild asymmetric left ventricular hypertrophy of the infero-lateral segment. Left ventricular diastolic parameters are consistent with Grade III diastolic dysfunction (restrictive). Elevated left ventricular end-diastolic pressure.  2. Right ventricular systolic function is normal. The right ventricular size is normal. There is moderately elevated pulmonary artery systolic pressure.  3. Left atrial size was moderately dilated.  4. Mitral valve regurgitant volume 59 mL. The mitral valve is degenerative. Moderate to severe mitral valve regurgitation. No evidence of mitral stenosis. Severe mitral annular calcification.  5. Tricuspid valve regurgitation is moderate.  6. Left coronary cusp is essentially fused. The aortic valve is normal in structure. There is moderate calcification of the aortic valve. There is moderate thickening of the aortic valve. Aortic valve regurgitation is moderate to severe. No aortic stenosis is  present.  7. The inferior vena cava is dilated in size with <50% respiratory variability, suggesting right atrial pressure of 15 mmHg. FINDINGS  Left  Ventricle: Posterolateral and basal to mid inferoseptal hypokinesis. Inferior akinesis. Left ventricular ejection fraction, by estimation, is 35 to 40%. The left ventricle has moderately decreased function. The left ventricle demonstrates regional wall motion abnormalities. The left ventricular internal cavity size was normal in size. There is mild asymmetric left ventricular hypertrophy of the infero-lateral segment. Left ventricular diastolic parameters are consistent with Grade III diastolic dysfunction (restrictive). Elevated left ventricular end-diastolic pressure. Right Ventricle: The right ventricular size is normal. No increase in right ventricular wall thickness. Right ventricular systolic function is normal. There is moderately elevated pulmonary artery systolic pressure. The tricuspid regurgitant velocity is 3.25 m/s, and with an assumed right atrial pressure of 15 mmHg, the estimated right ventricular systolic pressure is 76.7 mmHg. Left Atrium: Left atrial size was moderately dilated. Right Atrium: Right atrial size was normal in size. Pericardium: There is no evidence of pericardial effusion. Mitral Valve: Mitral valve regurgitant volume 59 mL. The mitral valve is degenerative in appearance. Severe mitral annular calcification. Moderate to severe mitral valve regurgitation. No evidence of mitral valve stenosis. MV peak gradient, 11.0 mmHg. The mean mitral valve gradient is 2.0 mmHg. Tricuspid Valve: The tricuspid valve is normal in structure. Tricuspid valve regurgitation is moderate . No evidence of tricuspid stenosis. Aortic Valve: Left coronary cusp is essentially fused. The aortic valve is normal in structure. There is moderate calcification of the aortic valve. There is moderate thickening of the aortic valve. Aortic valve regurgitation is moderate  to severe. Aortic regurgitation PHT measures 292 msec. No aortic stenosis is present. Aortic valve mean gradient measures 5.0 mmHg. Aortic valve peak gradient measures 8.8 mmHg. Aortic valve area, by VTI me

## 2021-07-14 NOTE — Progress Notes (Signed)
?PROGRESS NOTE ? ? ? ?Karen West  LPF:[id number] DOB: Jan 16, 1941 DOA: 07/11/2021 ?PCP: Deland Pretty, MD  ? ? 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  In the emergency room afebrile.  Blood pressure stable.  Respiration 30.  93% on room air.  Potassium 3, creatinine 2.34, transaminases elevated. ? ? ?Assessment & Plan: ?  ?Acute on chronic systolic congestive heart failure ?Ischemic cardiomyopathy with known ejection fraction 25 to 30% ?Severe mitral regurgitation, not a surgical candidate.  Fluid overload secondary to valvular heart disease ?Coronary artery disease status post coronary stents on dual antiplatelet therapy. ? ?Patient with severe cardiovascular disease.  Currently managed by heart failure team with ?Milrinone infusion 0.125 mcg/kg/min, Lasix 80 mg IV twice daily.  Some clinical improvement today.   ?On home dose of metoprolol.  ?Continue strict intake and output monitoring.  Advanced heart failure therapy as per cardiology. ?Patient is on aspirin and Brilinta after recent coronary stent, started on heparin for acute DVT.  She will need oral anticoagulation, defer to cardiology if one of the antiplatelet agents can be dropped. ?Severe mitral regurgitation possibly complicating fluid retention.  Not a surgical candidate. ?Patient with advanced cardiovascular disease with high mortality.  ?Today, patient has agreed to discuss with palliative care team.  She has very reasonable expectations from the treatment.   May benefit with outpatient palliative care all even hospice follow-up. ? ?Acute left lower extremity DVT: Started on heparin.  Will need DOAC.  As above, hopefully she can be off aspirin to avoid bleeding risk.   ? ?Acute on chronic kidney disease stage IV: Worsened with #1.  Close monitoring. ? ?Hypokalemia: Replaced.  Adequate. ? ?Congestive hepatopathy: LFTs stable. ? ?Type 2 diabetes, well controlled.  On insulin at home.  Remains on insulin and is stable. ? ?New onset A-fib: Currently converted to sinus rhythm.  Remains on amiodarone.  Therapeutic on heparin. ? ?DVT prophylaxis:   Heparin infusion. ? ? ?Code Status: Full code. ?Family Communication: None today. ?Disposition Plan: Status is: Inpatient ?Remains inpatient appropriate because: Severe cardiovascular disease.  On vasoactive infusions. ?  ? ? ?Consultants:  ?Cardiology ?Palliative care ? ?Procedures:  ?None ? ?Antimicrobials:  ?None ? ? ?Subjective: ?Patient seen and examined.  Slept in the [place] overnight.  Denies any chest pain, palpitations or shortness of breath today. ?Patient was sitting in the chair very comfortable.  She tells me that she had gone through so many issues and agreeable to talk to palliative care.   ? ?Objective: ?Vitals:  ? 07/13/21 2355 07/14/21 0409 07/14/21 0744 07/14/21 1123  ?BP: (!) 110/56 (!) 149/98 (!) 167/61 (!) 165/132  ?Pulse: 73 82 74 74  ?Resp: (!) 22 (!) 22 17 18   ?Temp: (!) 97.5 ?F (36.4 ?C) 97.7 ?F (36.5 ?C) (!) 97.4 ?F (36.3 ?C) (!) 97.5 ?F (36.4 ?C)  ?TempSrc: Oral Oral Oral Oral  ?SpO2: 95% 96% 100% 97%  ?Weight:      ?Height:      ? ?No intake or output data in the 24 hours ending 07/14/21 1138 ? ?Filed Weights  ? 07/13/21 0500  ?  Weight: 65.9 kg  ? ? ?Examination: ? ?General exam: Appears calm and comfortable .  Sitting in chair.  On room air.  Frail and debilitated.  Not in any distress. ?Respiratory system: Clear to auscultation.  Does have bibasilar crackles. ?Cardiovascular system: S1 & S2 heard, RRR.   Engorged jugular vein.  Pansystolic murmur at the mitral area. ?Trace pedal edema bilateral. ?Gastrointestinal system: Abdomen is nondistended, soft and nontender. No organomegaly or masses felt. Normal bowel sounds heard. ?Central nervous system: Alert and oriented. No focal neurological deficits. ?Extremities: Symmetric 5 x 5 power.  Generalized weakness. ? ? ? ? ?Data Reviewed: I have personally reviewed following labs and imaging studies ? ?CBC: ?Recent Labs  ?Lab 07/11/21 ?1520 07/12/21 ?0431 07/13/21 ?2703 07/14/21 ?0350  ?WBC 11.0* 11.5* 9.3 9.3  ?NEUTROABS 10.2* 9.8* 7.6  --   ?HGB 11.2* 10.3* 9.4* 9.3*  ?HCT 36.8 32.6* 29.9* 29.2*  ?MCV 95.8 92.9 91.7 91.5  ?PLT 202 178 187 194  ? ?Basic Metabolic Panel: ?Recent Labs  ?Lab 07/11/21 ?1520 07/12/21 ?0050 07/12/21 ?0431 07/12/21 ?1059 07/13/21 ?0507 07/14/21 ?0350  ?NA 135  --  138 137 137 130*  ?K 3.0*  --  2.8* 3.3* 4.0 4.7  ?CL 101  --  107 106 106 100  ?CO2 18*  --  19* 17* 19* 17*  ?GLUCOSE 187*  --  108* 128* 188* 231*  ?BUN 79*  --  73* 72* 77* 79*  ?CREATININE 2.34*  --  2.07* 2.08* 2.33* 2.48*  ?CALCIUM 8.2*  --  8.1* 8.1* 7.9* 7.6*  ?MG  --  2.7* 1.9  --  2.2  --   ?PHOS  --   --  5.2*  --   --   --   ? ?GFR: ?Estimated Creatinine Clearance: 16.2 mL/min (A) (by C-G formula based on SCr of 2.48 mg/dL (H)). ?Liver Function Tests: ?Recent Labs  ?Lab 07/11/21 ?1520 07/12/21 ?0431 07/13/21 ?0507  ?AST 138* 92* 80*  ?ALT 519* 413* 319*  ?ALKPHOS 138* 121 140*  ?BILITOT 1.5* 1.5* 1.6*  ?PROT 7.1 6.7 6.7  ?ALBUMIN 2.9* 2.6* 2.5*  ? ?Recent Labs  ?Lab 07/11/21 ?2035 07/12/21 ?0431  ?LIPASE 68* 59*  ? ?No results for input(s): AMMONIA in the last 168 hours. ?Coagulation Profile: ?No results for input(s): INR, PROTIME in the last 168 hours. ?Cardiac Enzymes: ?No results for input(s): CKTOTAL, CKMB, CKMBINDEX, TROPONINI in the last 168 hours. ?BNP (last 3 results) ?No results for input(s): PROBNP in the last 8760 hours. ?HbA1C: ?No results for input(s): HGBA1C  in the last 72 hours. ?CBG: ?Recent Labs  ?Lab 07/13/21 ?0827 07/13/21 ?1202 07/13/21 ?2159 07/14/21 ?5009 07/14/21 ?1122  ?GLUCAP 153* 308* 335* 179* 193*  ? ?Lipid Profile: ?No results for input(s): CHOL, HDL, LDLCALC, TRIG, CHOLHDL, LDLDIRECT in the last 72 hours. ?Thyroid Function Tests: ?No results for input(s): TSH, T4TOTAL, FREET4, T3FREE, THYROIDAB in the last 72 hours. ?Anemia Panel: ?No results for input(s): VITAMINB12, FOLATE, FERRITIN, TIBC, IRON, RETICCTPCT in the last 72 hours. ?Sepsis Labs: ?No results for input(s): PROCALCITON, LATICACIDVEN in the last 168 hours. ? ?No results found for this or any previous visit (from the past 240 hour(s)).  ? ? ? ? ? ?Radiology Studies: ?ECHOCARDIOGRAM COMPLETE ? ?Result Date: 07/14/2021 ?   ECHOCARDIOGRAM REPORT   Patient Name:   EMMERSON SHUFFIELD Date of Exam: 07/13/2021 Medical Rec #:  [id number]        Height:       61.0 in Accession #:  [id number]       Weight:       145.3 lb Date of Birth:  1940-03-23         BSA:          1.649 m? Patient Age:    68 years         BP:           101/57 mmHg Patient Gender: F                HR:           77 bpm. Exam Location:  Inpatient Procedure: 2D Echo, 3D Echo, Cardiac Doppler and Color Doppler Indications:    I50.20* Unspecified systolic (congestive) heart failure  History:        Patient has prior history of Echocardiogram examinations, most                 recent 05/16/2021. CHF, CAD and Previous Myocardial Infarction,                 Abnormal ECG, Mitral Valve Disease, Signs/Symptoms:Chest Pain;                 Risk Factors:Diabetes, Sleep Apnea, Dyslipidemia and                 Hypertension. Cancer. Mitral regurgitation.  Sonographer:    Roseanna Rainbow RDCS Referring Phys: [place] Comments: Technically difficult study due to poor echo windows. Image acquisition challenging due to mastectomy. Patient in high fowler's position due to dyspnea and heart rate. IMPRESSIONS  1. Posterolateral and basal to  mid inferoseptal hypokinesis. Inferior akinesis. Left ventricular ejection fraction, by estimation, is 35 to 40%. The left ventricle has moderately decreased function. The left ventricle demonstrates

## 2021-07-15 DIAGNOSIS — Z515 Encounter for palliative care: Secondary | ICD-10-CM | POA: Diagnosis not present

## 2021-07-15 DIAGNOSIS — Z66 Do not resuscitate: Secondary | ICD-10-CM

## 2021-07-15 DIAGNOSIS — I5023 Acute on chronic systolic (congestive) heart failure: Secondary | ICD-10-CM

## 2021-07-15 DIAGNOSIS — N179 Acute kidney failure, unspecified: Secondary | ICD-10-CM

## 2021-07-15 DIAGNOSIS — Z7189 Other specified counseling: Secondary | ICD-10-CM | POA: Diagnosis not present

## 2021-07-15 DIAGNOSIS — R079 Chest pain, unspecified: Secondary | ICD-10-CM | POA: Diagnosis not present

## 2021-07-15 LAB — GLUCOSE, CAPILLARY
Glucose-Capillary: 154 mg/dL — ABNORMAL HIGH (ref 70–99)
Glucose-Capillary: 206 mg/dL — ABNORMAL HIGH (ref 70–99)
Glucose-Capillary: 191 mg/dL — ABNORMAL HIGH (ref 70–99)
Glucose-Capillary: 236 mg/dL — ABNORMAL HIGH (ref 70–99)

## 2021-07-15 LAB — COOXEMETRY PANEL
Carboxyhemoglobin: 1.5 % (ref 0.5–1.5)
Carboxyhemoglobin: 1.6 % — ABNORMAL HIGH (ref 0.5–1.5)
Methemoglobin: <0.7 % (ref 0.0–1.5)
Methemoglobin: <0.7 % (ref 0.0–1.5)
O2 Saturation: 32.6 %
O2 Saturation: 58.6 %
Total hemoglobin: 10 g/dL — ABNORMAL LOW (ref 12.0–16.0)
Total hemoglobin: 10 g/dL — ABNORMAL LOW (ref 12.0–16.0)

## 2021-07-15 LAB — BASIC METABOLIC PANEL
Anion gap: 13 (ref 5–15)
BUN: 78 mg/dL — ABNORMAL HIGH (ref 8–23)
CO2: 20 mmol/L — ABNORMAL LOW (ref 22–32)
Calcium: 7.9 mg/dL — ABNORMAL LOW (ref 8.9–10.3)
Chloride: 99 mmol/L (ref 98–111)
Creatinine, Ser: 2.59 mg/dL — ABNORMAL HIGH (ref 0.44–1.00)
GFR, Estimated: 18 mL/min — ABNORMAL LOW (ref >60)
Glucose, Bld: 160 mg/dL — ABNORMAL HIGH (ref 70–99)
Potassium: 3.8 mmol/L (ref 3.5–5.1)
Sodium: 132 mmol/L — ABNORMAL LOW (ref 135–145)

## 2021-07-15 LAB — CBC
HCT: 30.5 % — ABNORMAL LOW (ref 36.0–46.0)
Hemoglobin: 10.1 g/dL — ABNORMAL LOW (ref 12.0–15.0)
MCH: 29.6 pg (ref 26.0–34.0)
MCHC: 33.1 g/dL (ref 30.0–36.0)
MCV: 89.4 fL (ref 80.0–100.0)
Platelets: 207 10*3/uL (ref 150–400)
RBC: 3.41 MIL/uL — ABNORMAL LOW (ref 3.87–5.11)
RDW: 21.6 % — ABNORMAL HIGH (ref 11.5–15.5)
WBC: 7.6 10*3/uL (ref 4.0–10.5)
nRBC: 1.4 % — ABNORMAL HIGH (ref 0.0–0.2)

## 2021-07-15 LAB — HEPARIN LEVEL (UNFRACTIONATED): Heparin Unfractionated: 0.36 IU/mL (ref 0.30–0.70)

## 2021-07-15 NOTE — Progress Notes (Signed)
Occupational Therapy Treatment ?Patient Details ?Name: Karen West ?MRN: [id number] ?DOB: [birth year] ?Today's Date: 07/15/2021 ? ? ?History of present illness Pt is 81 yr old F admitted on 07/11/21 with c/o chest pain, admitted with A/C HFrEf. Found to have acute DVT L LE. PMH: breast CA, chest pain, anemia, CAD, HTN, hyperthyroid, MI, OSA, OA, R BBB, DMII, NSTEMI, CKD ?  ?OT comments ? Focus of session on educating pt in energy conservation strategies. Pt with excellent family support and can request assistance with IADLs more than she was prior to admission. Pt ambulating to bathroom and sink with min guard assist. Asking to return to bed after up in chair much of day. Pt states she was told she was at the end of life. Assured pt that having palliative/hospice care does not hasten her death and may even improve quality of life while avoiding  hospitalizations and invasive treatments. Pt stating, "that is the best thing I have heard all day."   ? ?Recommendations for follow up therapy are one component of a multi-disciplinary discharge planning process, led by the attending physician.  Recommendations may be updated based on patient status, additional functional criteria and insurance authorization. ?   ?Follow Up Recommendations ? Home health OT  ?  ?Assistance Recommended at Discharge Intermittent Supervision/Assistance  ?Patient can return home with the following ? Assistance with cooking/housework;Assist for transportation;A little help with bathing/dressing/bathroom ?  ?Equipment Recommendations ?    ?  ?Recommendations for Other Services   ? ?  ?Precautions / Restrictions Precautions ?Precautions: Fall;Other (comment) ?Precaution Comments: watch HR ?Restrictions ?Weight Bearing Restrictions: No  ? ? ?  ? ?Mobility Bed Mobility ?Overal bed mobility: Independent ?  ?  ?  ?  ?  ?  ?  ?  ? ?Transfers ?Overall transfer level: Needs assistance ?Equipment used: None ?  ?Sit to Stand: Min guard ?  ?  ?  ?  ?  ?  ?  ?   ?Balance Overall balance assessment: Mild deficits observed, not formally tested ?  ?  ?  ?  ?  ?  ?  ?  ?  ?  ?  ?  ?  ?  ?  ?  ?  ?  ?   ? ?ADL either performed or assessed with clinical judgement  ? ?ADL Overall ADL's : Needs assistance/impaired ?  ?  ?Grooming: Supervision/safety;Standing ?  ?  ?  ?  ?  ?  ?  ?  ?  ?Toilet Transfer: Min guard;Ambulation ?  ?Toileting- Clothing Manipulation and Hygiene: Modified independent;Sitting/lateral lean ?  ?  ?  ?Functional mobility during ADLs: Min guard ?General ADL Comments: Educated in energy conservation strategies. Pt has excellent family support to start assisting more with IADLs. ?  ? ?Extremity/Trunk Assessment   ?  ?  ?  ?  ?  ? ?Vision   ?  ?  ?Perception   ?  ?Praxis   ?  ? ?Cognition Arousal/Alertness: Awake/alert ?Behavior During Therapy: Karen West for tasks assessed/performed ?Overall Cognitive Status: Within Functional Limits for tasks assessed ?  ?  ?  ?  ?  ?  ?  ?  ?  ?  ?  ?  ?  ?  ?  ?  ?General Comments: Pt stating she was told she is nearing the end of her life. ?  ?  ?   ?Exercises   ? ?  ?Shoulder Instructions   ? ? ?  ?General Comments    ? ? ?  Pertinent Vitals/ Pain       Pain Assessment ?Pain Assessment: No/denies pain ? ?Home Living   ?  ?  ?  ?  ?  ?  ?  ?  ?  ?  ?  ?  ?  ?  ?  ?  ?  ?  ? ?  ?Prior Functioning/Environment    ?  ?  ?  ?   ? ?Frequency ? Min 2X/week  ? ? ? ? ?  ?Progress Toward Goals ? ?OT Goals(current goals can now be found in the care plan section) ? Progress towards OT goals: Progressing toward goals ? ?Acute Rehab OT Goals ?OT Goal Formulation: With patient ?Time For Goal Achievement: 07/27/21 ?Potential to Achieve Goals: Good  ?Plan Discharge plan remains appropriate   ? ?Co-evaluation ? ? ?   ?  ?  ?  ?  ? ?  ?AM-PAC OT "6 Clicks" Daily Activity     ?Outcome Measure ? ? Help from another person eating meals?: None ?Help from another person taking care of personal grooming?: A Little ?Help from another person toileting, which  includes using toliet, bedpan, or urinal?: A Little ?Help from another person bathing (including washing, rinsing, drying)?: A Little ?Help from another person to put on and taking off regular upper body clothing?: None ?Help from another person to put on and taking off regular lower body clothing?: A Little ?6 Click Score: 20 ? ?  ?End of Session   ? ?OT Visit Diagnosis: Unsteadiness on feet (R26.81);Muscle weakness (generalized) (M62.81) ?  ?Activity Tolerance   ?  ?Patient Left in bed;with call bell/phone within reach;with family/visitor present ?  ?Nurse Communication   ?  ? ?   ? ?Time: 3545-6256 ?OT Time Calculation (min): 15 min ? ?Charges: OT General Charges ?$OT Visit: 1 Visit ?OT Treatments ?$Self Care/Home Management : 8-22 mins ? ?Karen West, OTR/L ?Acute Rehabilitation Services ?Pager: [phone] ?Office: [phone]  ? ?Karen West ?07/15/2021, 4:04 PM ?

## 2021-07-15 NOTE — Care Management Important Message (Signed)
Important Message ? ?Patient Details  ?Name: Karen West ?MRN: [id number] ?Date of Birth: 11-Mar-1941 ? ? ?Medicare Important Message Given:  Yes ? ? ? ? ?[name] ?07/15/2021, 7:54 AM ?

## 2021-07-15 NOTE — Consult Note (Signed)
?Consultation Note ?Date: 07/15/2021  ? ?Patient Name: Karen West  ?DOB: 1940-10-15  MRN: [id number]  Age / Sex: 81 y.o., female  ?PCP: Deland Pretty, MD ?Referring Physician: Barb Merino, MD ? ?Reason for Consultation: Establishing goals of care ? ?HPI/Patient Profile: 81 y.o. 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  Found to have EF 25 to 30%.  Found to have severe mitral regurgitation and not a surgical candidate.  Remains on milrinone infusion.  Patient also with acute on chronic kidney disease, creatinine worsening.  PMT consulted to discuss goals of care. ? ?Clinical Assessment and Goals of Care: ?I have reviewed medical records including EPIC notes, labs and imaging, received report from RN, assessed the patient and then met with patient, daughter, and sister to discuss diagnosis prognosis, GOC, EOL wishes, disposition and options. ? ?I introduced Palliative Medicine as specialized medical care for people living with serious illness. It focuses on providing relief from the symptoms and stress of a serious illness. The goal is to improve quality of life for both the patient and the family. ? ?Karen West tells me she has lived alone for 25 years.  Tells me she is ambulatory without assistive device in the home.  Tells me of poor appetite. ? ? We discussed patient's current illness and what it means in the larger context of patient's on-going co-morbidities.  Natural disease trajectory and expectations at EOL were discussed.  We discussed her heart failure, severe valvular disease, and kidney failure.  We discussed her current need for  milrinone how she did not tolerate trial off so far.  Discussed concern about path moving forward.  We discussed her chronic and terminal nature of her disease and that this is not something we can fix.  She expresses understanding.  We discussed concern that when she is discharged she will have complications that will lead to readmission.  Patient shares she does not want to continue rehospitalizations. ? ?I attempted to elicit values and goals of care important to the patient.  Patient tells me what is most important to her as being at home.  We discussed the need to see how she does in coming days to see if being at home with extra support is a feasible option. ? ?Encouraged patient to consider DNR/DNI status understanding evidenced based poor outcomes in similar hospitalized patients, as the cause of the arrest is likely associated with chronic/terminal disease rather than a reversible acute cardio-pulmonary event.  Patient agrees to DNR/DNI. ? ?Discussed with patient the importance of continued conversation with family and the medical providers regarding overall plan of care and treatment options, ensuring decisions are within the context of the patient?s values and GOCs.   ? ?Hospice services outpatient were explained and offered.  We discussed hospice care at home versus home health versus hospice facility depending on her needs as we move closer to discharge. ? ?Questions and concerns were addressed. The family was encouraged to call with questions or concerns. ? ?Primary Decision Maker ?PATIENT ?  ? ?SUMMARY OF RECOMMENDATIONS   ?Patient and family educated on disease and expectations moving forward, with good understanding ?CODE STATUS changed to DNR/DNI ?Introduced hospice support -patient a bit unsure, tells me she is open to  hospice when needed but she is not sure she needs that now ?Patient tells me her most important goal is being at home and I shared my thoughts that she would likely need hospice  support in the home in order to stay at home since she hopes to avoid future hospitalizations ?PMT to follow-up Monday ? ?Code Status/Advance Care Planning: ?DNR ? ? ?  ? ?Primary Diagnoses: ?Present on Admission: ? Chest pain ? Type 2 diabetes mellitus with complication, without long-term current use of insulin ([place]) ? Hyperlipidemia ? OBSTRUCTIVE SLEEP APNEA ? Essential hypertension ? HLD (hyperlipidemia) ? Acute on chronic systolic CHF (congestive heart failure) ([place]) ? ? ?I have reviewed the medical record, interviewed the patient and family, and examined the patient. The following aspects are pertinent. ? ?Past Medical History:  ?Diagnosis Date  ? Atherosclerosis of aorta ([place])   ? a. 01/2017/03/2017 - noted on high res chest CTs.  ? Breast cancer ([place])   ? a. Bilateral --> s/p left mastectomy  ? Carotid artery disease ([place])   ? a. 06/4313 w/ 40-08% LICA stenosis and <67% RICA stenosis; b. 10/2015 Carotid U/S: < 50% BICA stenosis  ? Chest pain   ? a. 09/2011 MV: EF 68%, no ischemia/infarct.  ? Chronic anemia   ? Chronic headaches   ? denies  ? Coronary artery calcification seen on CT scan   ? a. 01/2017 High res CT: atherosclerotic calcification of the arterial vascularture, including severe involvement of the coronary arteries; b. 03/2017 CT Chest: coronary and Ao atheroscelrosis.  ? GERD (gastroesophageal reflux disease)   ? History of echocardiogram   ? a. 09/2011 Echo: EF 55-60%, no rwma, triv AI, PASP 14mHg.  ? Hyperlipidemia   ? Hypertension   ? Hyperthyroidism   ? Left upper lobe pulmonary nodule   ? a. 02/2017 PET: slowing enlarging 1.7cm LUL nodule w/ low-grade metabolic activity; b. 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 05/2017 s/p VATS.  ? Multinodular goiter   ? a. 02/2017 PET scan- Hypermetabolic nodule;  b. 29/3267s/p thyroidectomy  ? Myocardial infarction ([facility] 06/2019  ? NONSTEMI  ? Obesity   ? OSA on CPAP   ? cpap  ? Osteoarthritis   ? Personal history of radiation therapy 1999  ? Right bundle  branch block   ? Type II or unspecified type diabetes mellitus without mention of complication, uncontrolled   ? ?Social History  ? ?Socioeconomic History  ? Marital status: Widowed  ?  Spouse name: Not on file  ? Number of children: Not on file  ? Years of education: 169 ? Highest education level: Bachelor's degree (e.g., BA, AB, BS)  ?Occupational History  ? Occupation: retired  ?  Employer: RETIRED  ?  Comment: accountant  ?Tobacco Use  ? Smoking status: Never  ? Smokeless tobacco: Never  ? Tobacco comments:  ?  Never smoke 06/17/21  ?Vaping Use  ? Vaping Use: Never used  ?Substance and Sexual Activity  ? Alcohol use: Yes  ?  Comment: occ  ? Drug use: No  ? Sexual activity: Yes  ?  Partners: Male  ?  Birth control/protection: None  ?Other Topics Concern  ? Not on file  ?Social History Narrative  ? Lives alone.  Four children.   ? ?Social Determinants of Health  ? ?Financial Resource Strain: Not on file  ?Food Insecurity: Not on file  ?Transportation Needs: Not on file  ?Physical Activity: Not on file  ?Stress: Not on file  ?Social Connections:  Not on file  ? ?Family History  ?Problem Relation Age of Onset  ? Diabetes Brother   ?     x3  ? Hypertension Brother   ?     x3  ? Diabetes Brother   ? Stroke Brother   ? Heart attack Mother 7  ?     Mother Died of MI age 13  ? Diabetes Sister   ? Stroke Sister   ? ?Scheduled Meds: ? aspirin EC  81 mg Oral Daily  ? Chlorhexidine Gluconate Cloth  6 each Topical Daily  ? dextromethorphan  60 mg Oral BID  ? furosemide  80 mg Intravenous BID  ? hydrALAZINE  12.5 mg Oral Q8H  ? insulin aspart  0-5 Units Subcutaneous QHS  ? insulin aspart  0-9 Units Subcutaneous TID WC  ? insulin glargine-yfgn  10 Units Subcutaneous Daily  ? isosorbide mononitrate  60 mg Oral Daily  ? levothyroxine  88 mcg Oral QAC breakfast  ? metoprolol succinate  12.5 mg Oral QHS  ? pantoprazole  40 mg Oral Daily  ? senna-docusate  2 tablet Oral QHS  ? sodium chloride flush  10-40 mL Intracatheter Q12H  ?  ticagrelor  90 mg Oral BID  ? ?Continuous Infusions: ? amiodarone 30 mg/hr (07/15/21 1057)  ? heparin 900 Units/hr (07/15/21 1057)  ? milrinone 0.125 mcg/kg/min (07/15/21 1227)  ? ?PRN Meds:.HYDROmorphone (DILAU

## 2021-07-15 NOTE — Progress Notes (Signed)
?PROGRESS NOTE ? ? ? ?Karen West  MVE:[id number] DOB: 1941-03-17 DOA: 07/11/2021 ?PCP: Deland Pretty, MD  ? ? 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  In the emergency room afebrile.  Blood pressure stable.  Respiration 30.  93% on room air.  Potassium 3, creatinine 2.34, transaminases elevated. ? ? ?Assessment & Plan: ?  ?Acute on chronic systolic congestive heart failure. ?Ischemic cardiomyopathy with known ejection fraction 25 to 30% ?Severe mitral regurgitation, not a surgical candidate.  Fluid overload secondary to valvular heart disease ?Coronary artery disease status post coronary stents on dual antiplatelet therapy. ? ?Patient with severe cardiovascular disease.  Currently managed by heart failure team with ?Milrinone infusion 0.125 mcg/kg/min, Lasix 80 mg IV twice daily.  Some clinical improvement today. ?On home dose of metoprolol.  ?Continue strict intake and output monitoring.  Advanced heart failure therapy as per cardiology. ?Patient is on aspirin and Brilinta after recent coronary stent, started on heparin for acute DVT.  She will need oral anticoagulation, defer to cardiology if one of the antiplatelet agents can be dropped. ?Severe mitral regurgitation possibly complicating fluid retention.  Not a surgical candidate. ?Patient with advanced cardiovascular disease with high mortality.  ?Patient agreeable to meet with palliative care team.  May benefit with home palliation or hospice. ? ?Acute left lower  extremity DVT: Started on heparin.  Will need DOAC.  As above, hopefully she can be off aspirin to avoid bleeding risk.   ? ?Acute on chronic kidney disease stage IV: Worsened with #1.  Close monitoring. ? ?Hypokalemia: Replaced.  Adequate. ? ?Congestive hepatopathy: LFTs stable. ? ?Type 2 diabetes, well controlled.  On insulin at home.  Remains on insulin and is stable. ? ?New onset A-fib: Currently converted to sinus rhythm.  Remains on amiodarone.  Therapeutic on heparin. ?We will need to change on oral rate control medication and DOAC. ? ?Goal of care: ?As above.  Discussed with patient about expectations from severe untreatable conditions.  Patient has very reasonable expectations.  She was asking me how long she is going to survive with this. ?I told her that given her severe cardiovascular problems along with advanced kidney disease, she is at very high risk of decompensation and fatal events. ?Patient wants to leave until at least she is 81 year old. ?Again, she is agreeable and open to discuss about her illnesses and best way forward.  She is open to palliative discussion. ? ?DVT prophylaxis: Place TED hose Start: 07/14/21 1246  Heparin infusion. ? ? ?Code Status: Full code. ?Family Communication: None. ?Disposition Plan: Status is: Inpatient ?Remains inpatient appropriate because: Severe cardiovascular disease.  On vasoactive infusions. ?  ? ? ?Consultants:  ?Cardiology ?Palliative care ? ?Procedures:  ?None ? ?Antimicrobials:  ?None ? ? ?Subjective: ? ?Seen and examined.  No overnight events.  Now she has some dry cough and wheezing.  Denies any chest pain or palpitations.  Remains on room air.  Blood pressures are adequate.  Telemetry with sinus rhythm. ? ?Objective: ?Vitals:  ? 07/15/21 0017 07/15/21 0520 07/15/21 0752 07/15/21 1148  ?BP: (!) 91/50 125/71 109/69 135/80  ?Pulse:  74 82 76 82  ?Resp: 20 20 20 20   ?Temp: 98 ?F (36.7 ?C) (!) 97.3 ?F (36.3 ?C) (!) 97.5 ?F (36.4 ?C) (!) 97.3 ?F (36.3 ?C)   ?TempSrc: Oral Axillary Oral Oral  ?SpO2: 97% 95% 90% 96%  ?Weight:  66.5 kg    ?Height:      ? ? ?Intake/Output Summary (Last 24 hours) at 07/15/2021 1211 ?Last data filed at 07/15/2021 1057 ?Gross per 24 hour  ?Intake 1047.05 ml  ?Output 2100 ml  ?Net -1052.95 ml  ? ? ?Filed Weights  ? 07/13/21 0500 07/14/21 1317 07/15/21 0520  ?Weight: 65.9 kg 70.1 kg 66.5 kg  ? ? ?Examination: ? ?General exam: Slightly anxious.  Calm and comfortable.  Frail and debilitated but on room air and comfortable at this time.   ?Respiratory system: Bilateral posterior basal crackles. ?Cardiovascular system: S1 & S2 heard, RRR.  Engorged jugular vein.  Pansystolic murmur at the mitral area. ?Trace pedal edema bilateral.  Now on compression stockings. ?Gastrointestinal system: Abdomen is nondistended, soft and nontender. No organomegaly or masses felt. Normal bowel sounds heard. ?Central nervous system: Alert and oriented. No focal neurological deficits. ?Extremities: Symmetric 5 x 5 power.  Generalized weakness. ? ? ? ? ?Data Reviewed: I have personally reviewed following labs and imaging studies ? ?CBC: ?Recent Labs  ?Lab 07/11/21 ?1520 07/12/21 ?0431 07/13/21 ?2440 07/14/21 ?0350 07/15/21 ?0540  ?WBC 11.0* 11.5* 9.3 9.3 7.6  ?NEUTROABS 10.2* 9.8* 7.6  --   --   ?HGB 11.2* 10.3* 9.4* 9.3* 10.1*  ?HCT 36.8 32.6* 29.9* 29.2* 30.5*  ?MCV 95.8 92.9 91.7 91.5 89.4  ?PLT 202 178 187 194 207  ? ?Basic Metabolic Panel: ?Recent Labs  ?Lab 07/12/21 ?0050 07/12/21 ?0431 07/12/21 ?1059 07/13/21 ?0507 07/14/21 ?0350 07/15/21 ?0540  ?NA  --  138 137 137 130* 132*  ?K  --  2.8* 3.3* 4.0 4.7 3.8  ?CL  --  107 106 106 100 99  ?CO2  --  19* 17* 19* 17* 20*  ?GLUCOSE  --  108* 128* 188* 231* 160*  ?BUN  --  73* 72* 77* 79* 78*  ?CREATININE  --  2.07* 2.08* 2.33* 2.48* 2.59*  ?CALCIUM  --  8.1* 8.1* 7.9* 7.6* 7.9*  ?MG 2.7* 1.9  --  2.2  --   --   ?PHOS  --  5.2*  --   --   --   --   ? ?GFR: ?Estimated Creatinine Clearance: 15.6 mL/min (A) (by C-G formula  based on SCr of 2.59 mg/dL (H)). ?Liver Function Tests: ?Recent Labs  ?Lab 07/11/21 ?1520 07/12/21 ?0431 07/13/21 ?0507  ?AST 138* 92* 80*  ?ALT 519* 413* 319*  ?ALKPHOS 138* 121 140*  ?BILITOT 1.5* 1.5* 1.6*  ?PROT 7.1 6.7 6.7  ?ALBUMIN 2.9* 2.6* 2.5*  ? ?Recent Labs  ?Lab 07/11/21 ?2035 07/12/21 ?0431  ?LIPASE 68* 59*  ? ?No results for input(s): AMMONIA in the last 168 hours. ?Coagulation Profile: ?No results for input(s): INR, PROTIME in the last 168 hours. ?Cardiac Enzymes: ?No results for input(s): CKTOTAL, CKMB, CKMBINDEX, TROPONINI in the last 168 hours. ?BNP (last 3 results) ?No results for input(s): PROBNP in the last 8760 hours. ?HbA1C: ?No results for input(s): HGBA1C in the last 72 hours. ?CBG: ?Recent Labs  ?Lab 07/14/21 ?1122 07/14/21 ?1531 07/14/21 ?2126 07/15/21 ?1027 07/15/21 ?1145  ?GLUCAP 193* 230* 177* 154* 206*  ? ?Lipid Profile: ?No results for input(s): CHOL, HDL, LDLCALC, TRIG, CHOLHDL, LDLDIRECT in the last 72  hours. ?Thyroid Function Tests: ?No results for input(s): TSH, T4TOTAL, FREET4, T3FREE, THYROIDAB in the last 72 hours. ?Anemia Panel: ?No results for input(s): VITAMINB12, FOLATE, FERRITIN, TIBC, IRON, RETICCTPCT in the last 72 hours. ?Sepsis Labs: ?No results for input(s): PROCALCITON, LATICACIDVEN in the last 168 hours. ? ?No results found for this or any previous visit (from the past 240 hour(s)).  ? ? ? ? ? ?Radiology Studies: ?ECHOCARDIOGRAM COMPLETE ? ?Result Date: 07/14/2021 ?   ECHOCARDIOGRAM REPORT   Patient Name:   Karen West Date of Exam: 07/13/2021 Medical Rec #:  [id number]        Height:       61.0 in Accession #:    [id number]       Weight:       145.3 lb Date of Birth:  1940-12-10         BSA:          1.649 m? Patient Age:    55 years         BP:           101/57 mmHg Patient Gender: F                HR:           77 bpm. Exam Location:  Inpatient Procedure: 2D Echo, 3D Echo, Cardiac Doppler and Color Doppler Indications:    I50.20* Unspecified systolic (congestive)  heart failure  History:        Patient has prior history of Echocardiogram examinations, most                 recent 05/16/2021. CHF, CAD and Previous Myocardial Infarction,                 Abnormal ECG, Mitral

## 2021-07-15 NOTE — Progress Notes (Signed)
ANTICOAGULATION CONSULT NOTE - Follow Up Consult ? ?Pharmacy Consult for Heparin ?Indication: LLE DVT / Afib ? ?Allergies  ?Allergen Reactions  ? Tramadol Nausea And Vomiting and Other (See Comments)  ?  "got sick to my stomach and was throwing up blood; it put me in the hospital")  ? Lisinopril Rash and Other (See Comments)  ?  Renal failure ?  ? Rosuvastatin Other (See Comments)  ?  Other reaction(s): rhabdo  ? Tapazole [Methimazole] Itching and Rash  ? ? ?Patient Measurements: ?Height: 5\' 1"  (154.9 cm) ?Weight: 66.5 kg (146 lb 11.2 oz) ?IBW/kg (Calculated) : 47.8 ?Heparin Dosing Weight: 62 kg ? ?Vital Signs: ?Temp: 97.3 ?F (36.3 ?C) (04/28 1148) ?Temp Source: Oral (04/28 1148) ?BP: 135/80 (04/28 1148) ?Pulse Rate: 82 (04/28 1148) ? ?Labs: ?Recent Labs  ?   ?0000 07/13/21 ?0507 07/13/21 ?4503 07/13/21 ?1530 07/14/21 ?0350 07/14/21 ?0435 07/15/21 ?0540  ?HGB   < >  --  9.4*  --  9.3*  --  10.1*  ?HCT  --   --  29.9*  --  29.2*  --  30.5*  ?PLT  --   --  187  --  194  --  207  ?HEPARINUNFRC  --  1.08*  --    < > >1.10* 0.51 0.36  ?CREATININE  --  2.33*  --   --  2.48*  --  2.59*  ? < > = values in this interval not displayed.  ? ? ? ?Estimated Creatinine Clearance: 15.6 mL/min (A) (by C-G formula based on SCr of 2.59 mg/dL (H)). ? ?Assessment: ?81 yo female with LLE DVT per duplex and started IV heparin on 07/12/21. No anticoagulants noted PTA but noted on ticagrelor and DES placed 04/23/21.  Had received Enoxaparin 30 mg x 1 on 4/25 at ~9am. ?Heparin drip 900 uts/hr running through PICC line with restricted arm access, monitor for accuracy of heparin levels  ?Heparin level 0.35 this am at goal  ?Cbc stable no bleeding noted  ?Plan for oral anticoagulation when procedures are completed ? ?Goal of Therapy:  ?Heparin level 0.3-0.7 units/ml ?Monitor platelets by anticoagulation protocol: Yes ?  ?Plan:  ?Continue heparin drip at 900 units/hr ?Next heparin level and CBC in am. ? ? ? ?Bonnita Nasuti Pharm.D. CPP, BCPS ?Clinical  Pharmacist ?[phone] ?07/15/2021 1:31 PM  ? ? ? ?

## 2021-07-15 NOTE — Progress Notes (Addendum)
? ? Advanced Heart Failure Rounding Note ? ?PCP-Cardiologist: Minus Breeding, MD  ?AHF: Dr. Haroldine Laws  ? ?Subjective:   ? ?4/25: Admitted w/ a/c CHF w/ low output. PICC placed. Initial Co-ox 57%. Started on milrinone 0.25 + IV Lasix.   ?LE Venous Dopplers + for left DVT. On heparin gtt.  ?4/26 A fib RVR. Loaded on amio. Transient hypotension. Milrinone stopped. CO-OX 43%. Milrinone 0.125 mcg restarted  ? ?Currently on amio drip @ 30/hr and milrinone 0.125 mcg. CO-OX59 %.  ? ?1800 ml in UOP yesterday.  ? ?Creatinine 2.08>2.3>2.5>2.6 ? ?OOB, sitting up in chair, eating lunch w/ family at bedside. Denies resting dyspnea. CVP 13-14.  ? ? ?Objective:   ?Weight Range: ?66.5 kg ?Body mass index is 27.72 kg/m?.  ? ?Vital Signs:   ?Temp:  [97.3 ?F (36.3 ?C)-98 ?F (36.7 ?C)] 97.3 ?F (36.3 ?C) (04/28 1148) ?Pulse Rate:  [74-90] 90 (04/28 1423) ?Resp:  [13-20] 20 (04/28 1148) ?BP: (91-135)/(50-87) 110/82 (04/28 1423) ?SpO2:  [90 %-100 %] 97 % (04/28 1423) ?Weight:  [66.5 kg] 66.5 kg (04/28 0520) ?Last BM Date : 07/13/21 ? ?Weight change: ?Filed Weights  ? 07/13/21 0500 07/14/21 1317 07/15/21 0520  ?Weight: 65.9 kg 70.1 kg 66.5 kg  ? ? ?Intake/Output:  ? ?Intake/Output Summary (Last 24 hours) at 07/15/2021 1424 ?Last data filed at 07/15/2021 1057 ?Gross per 24 hour  ?Intake 707.05 ml  ?Output 2100 ml  ?Net -1392.95 ml  ? ?  ? ? ?Physical Exam  ?  ?CVP 14  ?General:  OOB, sitting up in chair. No respiratory difficulty  ?HEENT: normal ?Neck: supple. JVD to jaw.  Carotids 2+ bilat; no bruits. No lymphadenopathy or thryomegaly appreciated. ?Cor: PMI nondisplaced. Irregularly rhythm. No rubs, gallops or murmurs. ?Lungs: clear ?Abdomen: soft, nontender, nondistended. No hepatosplenomegaly. No bruits or masses. Good bowel sounds. ?Extremities: no cyanosis, clubbing, rash, 1+ b/l LE + RUE PICC  ?Neuro: alert & orientedx3, cranial nerves grossly intact. moves all 4 extremities w/o difficulty. Affect pleasant ? ? ?Telemetry  ? ?SR 70-80s  with PACs, PVCs and NSVT  ? ?EKG  ?  ?No new EKG to review  ? ?Labs  ?  ?CBC ?Recent Labs  ?  07/13/21 ?2585 07/14/21 ?0350 07/15/21 ?0540  ?WBC 9.3 9.3 7.6  ?NEUTROABS 7.6  --   --   ?HGB 9.4* 9.3* 10.1*  ?HCT 29.9* 29.2* 30.5*  ?MCV 91.7 91.5 89.4  ?PLT 187 194 207  ? ?Basic Metabolic Panel ?Recent Labs  ?  07/13/21 ?0507 07/14/21 ?0350 07/15/21 ?0540  ?NA 137 130* 132*  ?K 4.0 4.7 3.8  ?CL 106 100 99  ?CO2 19* 17* 20*  ?GLUCOSE 188* 231* 160*  ?BUN 77* 79* 78*  ?CREATININE 2.33* 2.48* 2.59*  ?CALCIUM 7.9* 7.6* 7.9*  ?MG 2.2  --   --   ? ?Liver Function Tests ?Recent Labs  ?  07/13/21 ?0507  ?AST 80*  ?ALT 319*  ?ALKPHOS 140*  ?BILITOT 1.6*  ?PROT 6.7  ?ALBUMIN 2.5*  ? ?No results for input(s): LIPASE, AMYLASE in the last 72 hours. ? ?Cardiac Enzymes ?No results for input(s): CKTOTAL, CKMB, CKMBINDEX, TROPONINI in the last 72 hours. ? ?BNP: ?BNP (last 3 results) ?Recent Labs  ?  04/27/21 ?2778 06/17/21 ?0957 06/29/21 ?1818  ?BNP 1,077.8* 2,771.3* 3,309.2*  ? ? ?ProBNP (last 3 results) ?No results for input(s): PROBNP in the last 8760 hours. ? ? ?D-Dimer ?No results for input(s): DDIMER in the last 72 hours. ?Hemoglobin A1C ?No results  for input(s): HGBA1C in the last 72 hours. ?Fasting Lipid Panel ?No results for input(s): CHOL, HDL, LDLCALC, TRIG, CHOLHDL, LDLDIRECT in the last 72 hours. ?Thyroid Function Tests ?No results for input(s): TSH, T4TOTAL, T3FREE, THYROIDAB in the last 72 hours. ? ?Invalid input(s): FREET3 ? ?Other results: ? ? ?Imaging  ? ? ?No results found. ? ? ?Medications:   ? ? ?Scheduled Medications: ? aspirin EC  81 mg Oral Daily  ? Chlorhexidine Gluconate Cloth  6 each Topical Daily  ? dextromethorphan  60 mg Oral BID  ? furosemide  80 mg Intravenous BID  ? hydrALAZINE  12.5 mg Oral Q8H  ? insulin aspart  0-5 Units Subcutaneous QHS  ? insulin aspart  0-9 Units Subcutaneous TID WC  ? insulin glargine-yfgn  10 Units Subcutaneous Daily  ? isosorbide mononitrate  60 mg Oral Daily  ? levothyroxine   88 mcg Oral QAC breakfast  ? metoprolol succinate  12.5 mg Oral QHS  ? pantoprazole  40 mg Oral Daily  ? senna-docusate  2 tablet Oral QHS  ? sodium chloride flush  10-40 mL Intracatheter Q12H  ? ticagrelor  90 mg Oral BID  ? ? ?Infusions: ? amiodarone 30 mg/hr (07/15/21 1057)  ? heparin 900 Units/hr (07/15/21 1057)  ? milrinone 0.125 mcg/kg/min (07/15/21 1227)  ? ? ?PRN Medications: ?HYDROmorphone (DILAUDID) injection, melatonin, nitroGLYCERIN, oxyCODONE, polyethylene glycol, prochlorperazine, sodium chloride flush ? ? ? ?Patient Profile  ? ?81 y.o. female with CAD s/p recent inferolateral STEMI 2/23 w/ PCI to the RCA, severe (suspected infarct related) mitral regurgitation not candidate for TEER and being considered for Percutaneous MV replacement at [facility], chronic systolic heart failure w/ EF 35-40%, Stage IV CKD, HTN, anemia, DM and sleep apnea.  ? ? ?Admitted with increased dyspnea/abdominal pain in the setting of A/C HFrEF and suspected low output and AKI on CKD. Also found to have Lt LE DVT.  ? ?Assessment/Plan  ? ?1. A/C HFrEF, ICM  ?-Echo (2/23): EF 30-35% and severe MR ?-TEE (2/23): 35-40% severe MR ?-RHC (2/23): showed normal cardiac output  ?-Presented increased dyspnea and abd pain and low output, Co-ox 57% ?-needs surgical management of severe MR however not current surgical candidate w/ co morbidities.  ?- CO-OX 43% off milrinone. Restarted 0.125 mcg. CO-OX 59% .  ?- CVP 13-14. Continue lasix 80 mg twice a day.   ?-No arb/mri/dig with elevated creatinine.  ?- Continue 12.5 mg hydralazine three times a day.Continue imdur.  ?- Echo EF 35-40% RV normal.  ?  ?2. Elevated LFTs  ?- Abd [place] neg for cholecystitis.  ?- Suspect in the setting hepatic congestion/low output hf.  ?- LFTs downtrending  ?  ?3. Severe MR  ?-TEE 2/23 reviewed EF 35-40% severe MR. ?- Reviewed with Structural Team. Not candidate for MV Clip due to perivalvular calcium and short posterior leaflet. Dr Ali Lowe referred to [place]  for possible percutaneous MVR, however not current candidate for surgery given co morbidities.  ? ?4. CKD Stage IV ?-Recent creatinine baseline 2.2-2.4 ?-SCr 2.08>>2.33 >>2.48>>2.59 today  ?-Suspect cardiorenal in setting of low output HF  ?-continue milrinone  ?  ?5. Anemia  ?-Hgb 10.3>>9.4>>9.3>10.1  ?-monitor w/ heparin gtt, asa and Brilinta  ?  ?6. DMII ?-Hgb A1C 6 04/2021  ?-On sliding scale.  ?  ?7. CAD  ? Late presentation STEMI that compounded her chronic moderate mitral regurgitation.  ?-Had recent DES to LCx. On asa and brillinta. Hold statin with elevated LFTs.  ?-HS Trop 81>71>87 ?-No chest  pain.  ?-she now has LE DVT and will need a/c. PCI > 30 days ago. Can stop ASA once transitioned to [place]. ? Changing from [place] to Plavix to reduce bleed risk given chronic anemia. ? - Continue PPI    ?  ?8. PVCs  ?-10-15 PVCs per hour. Replaced K and Mag  ?-continue amio drip.  ? ?9. Lt LE DVT ?- new this admit, on heparin gtt. PE has not been ruled out. No chest CT w/ CKD. Consider V/Q scan though may not change management  ?-repeat limited echo w/ normal RV  ? ?10.  A Fib RVR ?-4/26 Developed  A fib RVR. Loading amio drip. Converted to SR.  ?- Continue amio while on milrinone.  ?-Continue heparin drip.  ? ?Renal fx continues to worsening. Palliative care team has been consulted. CODE status changed to DNR. Hospice support introduced to patient today. She is considering.   ? ?Palliative Care consulted. ? ?Length of Stay: 3 ? ?Lyda Jester, PA-C  ?07/15/2021, 2:24 PM ? ?Advanced Heart Failure Team ?Pager [phone] (M-F; 7a - 5p)  ?Please contact [facility] for night-coverage after hours (5p -7a ) and weekends on [email or web address] ? ?Patient seen and examined with the above-signed Advanced Practice Provider and/or Housestaff. I personally reviewed laboratory data, imaging studies and relevant notes. I independently examined the patient and formulated the important aspects of the plan. I have edited the note to  reflect any of my changes or salient points. I have personally discussed the plan with the patient and/or family. ? ?Co-ox improved with milrinone but remains very tenuous with elevated CVP and worsening re

## 2021-07-15 NOTE — Progress Notes (Signed)
Pt refused CPAP

## 2021-07-16 DIAGNOSIS — R079 Chest pain, unspecified: Secondary | ICD-10-CM | POA: Diagnosis not present

## 2021-07-16 DIAGNOSIS — I5023 Acute on chronic systolic (congestive) heart failure: Secondary | ICD-10-CM | POA: Diagnosis not present

## 2021-07-16 LAB — CBC
HCT: 28.6 % — ABNORMAL LOW (ref 36.0–46.0)
Hemoglobin: 9.7 g/dL — ABNORMAL LOW (ref 12.0–15.0)
MCH: 29.3 pg (ref 26.0–34.0)
MCHC: 33.9 g/dL (ref 30.0–36.0)
MCV: 86.4 fL (ref 80.0–100.0)
Platelets: 190 10*3/uL (ref 150–400)
RBC: 3.31 MIL/uL — ABNORMAL LOW (ref 3.87–5.11)
RDW: 21 % — ABNORMAL HIGH (ref 11.5–15.5)
WBC: 6.6 10*3/uL (ref 4.0–10.5)
nRBC: 2 % — ABNORMAL HIGH (ref 0.0–0.2)

## 2021-07-16 LAB — GLUCOSE, CAPILLARY
Glucose-Capillary: 166 mg/dL — ABNORMAL HIGH (ref 70–99)
Glucose-Capillary: 159 mg/dL — ABNORMAL HIGH (ref 70–99)
Glucose-Capillary: 192 mg/dL — ABNORMAL HIGH (ref 70–99)
Glucose-Capillary: 168 mg/dL — ABNORMAL HIGH (ref 70–99)

## 2021-07-16 LAB — COOXEMETRY PANEL
Carboxyhemoglobin: 1.6 % — ABNORMAL HIGH (ref 0.5–1.5)
Methemoglobin: <0.7 % (ref 0.0–1.5)
O2 Saturation: 57.6 %
Total hemoglobin: 10 g/dL — ABNORMAL LOW (ref 12.0–16.0)

## 2021-07-16 LAB — BASIC METABOLIC PANEL
Anion gap: 16 — ABNORMAL HIGH (ref 5–15)
BUN: 76 mg/dL — ABNORMAL HIGH (ref 8–23)
CO2: 19 mmol/L — ABNORMAL LOW (ref 22–32)
Calcium: 7.9 mg/dL — ABNORMAL LOW (ref 8.9–10.3)
Chloride: 92 mmol/L — ABNORMAL LOW (ref 98–111)
Creatinine, Ser: 2.49 mg/dL — ABNORMAL HIGH (ref 0.44–1.00)
GFR, Estimated: 19 mL/min — ABNORMAL LOW (ref >60)
Glucose, Bld: 193 mg/dL — ABNORMAL HIGH (ref 70–99)
Potassium: 3.1 mmol/L — ABNORMAL LOW (ref 3.5–5.1)
Sodium: 127 mmol/L — ABNORMAL LOW (ref 135–145)

## 2021-07-16 LAB — HEPARIN LEVEL (UNFRACTIONATED): Heparin Unfractionated: 0.45 IU/mL (ref 0.30–0.70)

## 2021-07-16 MED ORDER — POTASSIUM CHLORIDE CRYS ER 20 MEQ PO TBCR
40.0000 meq | EXTENDED_RELEASE_TABLET | Freq: Three times a day (TID) | ORAL | Status: AC
  Administered 2021-07-16 – 2021-07-17 (×4): 40 meq via ORAL
  Filled 2021-07-16 (×4): qty 2

## 2021-07-16 MED ORDER — BENZONATATE 100 MG PO CAPS: 100.0000 mg | ORAL_CAPSULE | Freq: Three times a day (TID) | ORAL | Status: DC | PRN

## 2021-07-16 MED ORDER — METOLAZONE 5 MG PO TABS
2.5000 mg | ORAL_TABLET | Freq: Once | ORAL | Status: AC
  Administered 2021-07-16: 2.5 mg via ORAL
  Filled 2021-07-16: qty 1

## 2021-07-16 NOTE — Progress Notes (Signed)
? ? Advanced Heart Failure Rounding Note ? ?PCP-Cardiologist: Minus Breeding, MD  ?AHF: Dr. Haroldine Laws  ? ?Subjective:   ? ?4/25: Admitted w/ a/c CHF w/ low output. PICC placed. Initial Co-ox 57%. Started on milrinone 0.25 + IV Lasix.   ?LE Venous Dopplers + for left DVT. On heparin gtt.  ?4/26 A fib RVR. Loaded on amio. Transient hypotension. Milrinone stopped. CO-OX 43%. Milrinone 0.125 mcg restarted  ? ?Currently on amio drip @ 30/hr and milrinone 0.125 mcg. CO-OX 58% CV 17-18 ? ?Remains on IV lasix. 2L out. Weight up a pound.  ? ?Remains SOB with any exertion. + orthopnea. No CP.  ? ?Creatinine 2.08>2.3>2.5>2.6> 2.49 ? ?Has been seen by Palliative Care. Now DNR/DNI  ? ?Objective:   ?Weight Range: ?66.9 kg ?Body mass index is 27.85 kg/m?.  ? ?Vital Signs:   ?Temp:  [97.2 ?F (36.2 ?C)-97.8 ?F (36.6 ?C)] 97.2 ?F (36.2 ?C) (04/29 1093) ?Pulse Rate:  [72-90] 82 (04/29 0806) ?Resp:  [18-20] 18 (04/29 0806) ?BP: (103-158)/(58-84) 103/59 (04/29 0806) ?SpO2:  [94 %-99 %] 94 % (04/29 0806) ?Weight:  [66.9 kg] 66.9 kg (04/29 0530) ?Last BM Date : 07/13/21 ? ?Weight change: ?Filed Weights  ? 07/14/21 1317 07/15/21 0520 07/16/21 0530  ?Weight: 70.1 kg 66.5 kg 66.9 kg  ? ? ?Intake/Output:  ? ?Intake/Output Summary (Last 24 hours) at 07/16/2021 1246 ?Last data filed at 07/16/2021 0300 ?Gross per 24 hour  ?Intake 702.1 ml  ?Output 1700 ml  ?Net -997.9 ml  ? ? ?  ? ? ?Physical Exam  ? ?General:  Sitting up. SOB with talking ?HEENT: normal ?Neck: supple.JVP to jaw  Carotids 2+ bilat; no bruits. No lymphadenopathy or thryomegaly appreciated. ?Cor: PMI nondisplaced. Regular rate & rhythm. 3/6 MR ?Lungs: + basilar crackles ?Abdomen: soft, nontender, nondistended. No hepatosplenomegaly. No bruits or masses. Good bowel sounds. ?Extremities: no cyanosis, clubbing, rash, 2+ edema ?Neuro: alert & orientedx3, cranial nerves grossly intact. moves all 4 extremities w/o difficulty. Affect pleasant ? ? ?Telemetry  ? ?SR 80s with PACs, PVCs and  NSVT Personally reviewed ? ? ?Labs  ?  ?CBC ?Recent Labs  ?  07/15/21 ?2355 07/16/21 ?7322  ?WBC 7.6 6.6  ?HGB 10.1* 9.7*  ?HCT 30.5* 28.6*  ?MCV 89.4 86.4  ?PLT 207 190  ? ? ?Basic Metabolic Panel ?Recent Labs  ?  07/15/21 ?0254 07/16/21 ?2706  ?NA 132* 127*  ?K 3.8 3.1*  ?CL 99 92*  ?CO2 20* 19*  ?GLUCOSE 160* 193*  ?BUN 78* 76*  ?CREATININE 2.59* 2.49*  ?CALCIUM 7.9* 7.9*  ? ? ?Liver Function Tests ?No results for input(s): AST, ALT, ALKPHOS, BILITOT, PROT, ALBUMIN in the last 72 hours. ? ?No results for input(s): LIPASE, AMYLASE in the last 72 hours. ? ?Cardiac Enzymes ?No results for input(s): CKTOTAL, CKMB, CKMBINDEX, TROPONINI in the last 72 hours. ? ?BNP: ?BNP (last 3 results) ?Recent Labs  ?  04/27/21 ?2376 06/17/21 ?0957 06/29/21 ?1818  ?BNP 1,077.8* 2,771.3* 3,309.2*  ? ? ? ?ProBNP (last 3 results) ?No results for input(s): PROBNP in the last 8760 hours. ? ? ?D-Dimer ?No results for input(s): DDIMER in the last 72 hours. ?Hemoglobin A1C ?No results for input(s): HGBA1C in the last 72 hours. ?Fasting Lipid Panel ?No results for input(s): CHOL, HDL, LDLCALC, TRIG, CHOLHDL, LDLDIRECT in the last 72 hours. ?Thyroid Function Tests ?No results for input(s): TSH, T4TOTAL, T3FREE, THYROIDAB in the last 72 hours. ? ?Invalid input(s): FREET3 ? ?Other results: ? ? ?Imaging  ? ? ?  No results found. ? ? ?Medications:   ? ? ?Scheduled Medications: ? aspirin EC  81 mg Oral Daily  ? Chlorhexidine Gluconate Cloth  6 each Topical Daily  ? dextromethorphan  60 mg Oral BID  ? furosemide  80 mg Intravenous BID  ? hydrALAZINE  12.5 mg Oral Q8H  ? insulin aspart  0-5 Units Subcutaneous QHS  ? insulin aspart  0-9 Units Subcutaneous TID WC  ? insulin glargine-yfgn  10 Units Subcutaneous Daily  ? isosorbide mononitrate  60 mg Oral Daily  ? levothyroxine  88 mcg Oral QAC breakfast  ? metoprolol succinate  12.5 mg Oral QHS  ? pantoprazole  40 mg Oral Daily  ? potassium chloride  40 mEq Oral TID  ? senna-docusate  2 tablet Oral QHS  ?  sodium chloride flush  10-40 mL Intracatheter Q12H  ? ticagrelor  90 mg Oral BID  ? ? ?Infusions: ? amiodarone 30 mg/hr (07/16/21 9937)  ? heparin 900 Units/hr (07/15/21 1921)  ? milrinone 0.125 mcg/kg/min (07/16/21 0238)  ? ? ?PRN Medications: ?benzonatate, HYDROmorphone (DILAUDID) injection, melatonin, nitroGLYCERIN, oxyCODONE, polyethylene glycol, prochlorperazine, sodium chloride flush ? ? ? ?Patient Profile  ? ?81 y.o. female with CAD s/p recent inferolateral STEMI 2/23 w/ PCI to the RCA, severe (suspected infarct related) mitral regurgitation not candidate for TEER and being considered for Percutaneous MV replacement at [facility], chronic systolic heart failure w/ EF 35-40%, Stage IV CKD, HTN, anemia, DM and sleep apnea.  ? ? ?Admitted with increased dyspnea/abdominal pain in the setting of A/C HFrEF and suspected low output and AKI on CKD. Also found to have Lt LE DVT.  ? ?Assessment/Plan  ? ?1. A/C HFrEF, ICM  ?-Echo (2/23): EF 30-35% and severe MR ?-TEE (2/23): 35-40% severe MR ?-RHC (2/23): showed normal cardiac output  ?-Presented increased dyspnea and abd pain and low output, Co-ox 57% ?-needs surgical management of severe MR however not current surgical candidate w/ co morbidities.  ?- Echo 4/23  EF 35-40% RV normal. Severe MR ?- CO-OX 43% off milrinone. Restarted 0.125 mcg. CO-OX 59% .  ?- CVP remains elevated Continue lasix 80 mg twice a day.   ?- Not making much progress with diuresis. Increase milrinone to 0.25. Add metolazone  ?- No arb/mri/dig with elevated creatinine.  ?- BP soft. Stop hydral/imdur ?- Diuresing modestly but weight up and renal function remains elevated despite inotrope support. I am afraid that we may not have many options left for her.  ?- Will increase milrinone and continue attempts at diuresis for now. I discussed code status and palliative care with her and her family. And she is clear about DNR/DNI but wants to fight a little more before considering Hospice. We will see  how the weekend goes.  ?  ?2. Transaminitis  ?- Abd [place] neg for cholecystitis.  ?- Due to hepatic congestion/low output hf.  ?- LFTs downtrending  ?  ?3. Severe MR  ?- TEE 2/23 reviewed EF 35-40% severe MR. ?- Reviewed with Structural Team. Not candidate for MV Clip due to perivalvular calcium and short posterior leaflet. Dr Ali Lowe referred to [place] for possible percutaneous MVR, however not current candidate for surgery given co morbidities.  ? ?4. CKD Stage IV ?-Recent creatinine baseline 2.2-2.4 ?-SCr 2.08>>2.33 >>2.48>>2.59 > 2.49 today  ?-Suspect cardiorenal in setting of low output HF  ?-continue milrinone  ?  ?5. Anemia of chronic illness ?-Hgb 10.3>>9.4>>9.3>10.1  ?-monitor w/ heparin gtt, asa and Brilinta  ?  ?6. DMII ?-Hgb  A1C 6 04/2021  ?-On sliding scale.  ?  ?7. CAD  ? Late presentation STEMI that compounded her chronic moderate mitral regurgitation.  ?-Had recent DES to LCx. On asa and brillinta. Resume statin ?-HS Trop 81>71>87 ?-No s/s angina ?-she now has LE DVT and will need a/c. PCI > 30 days ago. Can stop ASA once transitioned to [place]. ? Changing from [place] to Plavix to reduce bleed risk given chronic anemia. ? - Continue PPI    ?  ?8. PVCs  ?-10-15 PVCs per hour. Replaced K and Mag  ?-continue amio drip.  ? ?9. LLE DVT ?- new this admit, on heparin gtt. PE has not been ruled out. No chest CT w/ CKD. ?-repeat limited echo w/ normal RV  ? ?10.  Paroxysmal A Fib RVR ?-4/26 Developed  A fib RVR. Loading amio drip. Converted to SR.  ?- Continue amio while on milrinone.  ?-Continue heparin drip.  ? ? ?Length of Stay: 4 ? ?Glori Bickers, MD  ?07/16/2021, 12:46 PM ? ?Advanced Heart Failure Team ?Pager [phone] (M-F; 7a - 5p)  ?Please contact [facility] for night-coverage after hours (5p -7a ) and weekends on [email or web address] ? ? ? ? ?

## 2021-07-16 NOTE — Progress Notes (Signed)
?PROGRESS NOTE ? ? ? ?Karen West  JTT:[id number] DOB: 1940/03/27 DOA: 07/11/2021 ?PCP: Deland Pretty, MD  ? ? 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  In the emergency room afebrile.  Blood pressure stable.  Respiration 30.  93% on room air.  Potassium 3, creatinine 2.34, transaminases elevated. ? ? ?Assessment & Plan: ?  ?Acute on chronic systolic congestive heart failure. ?Ischemic cardiomyopathy with known ejection fraction 25 to 30% ?Severe mitral regurgitation, not a surgical candidate.  Fluid overload secondary to valvular heart disease ?Coronary artery disease status post coronary stents on dual antiplatelet therapy. ? ?Patient with severe cardiovascular disease.  Currently managed by heart failure team with ?Milrinone infusion 0.125 mcg/kg/min, Lasix 80 mg IV twice daily.   ?On home dose of metoprolol.  ?Continue strict intake and output monitoring.  Advanced heart failure therapy as per cardiology. ?Patient is on aspirin and Brilinta after recent coronary stent, started on heparin for acute DVT.  She will need oral anticoagulation, defer to cardiology if one of the antiplatelet agents can be dropped. ?Severe mitral regurgitation possibly complicating fluid retention.  Not a surgical candidate. ?Patient with advanced cardiovascular disease with high mortality.  ?Seen by palliative care team.  Agreed for DNR/DNI.  Continue full scope of treatment.  May benefit with home hospice but she is not ready yet.  Family on  board. ? ?Acute left lower extremity DVT: Started on heparin.  Will need DOAC.  As above, hopefully she can be off aspirin to avoid bleeding risk.   ? ?Acute on chronic kidney disease stage IV: Creatinine about 2.5-2.6.  This may be her new baseline. ? ?Hypokalemia: Replace aggressively today.  Daily monitoring. ? ?Congestive hepatopathy: LFTs stable. ? ?Type 2 diabetes, well controlled.  On insulin at home.  Remains on insulin and is stable. ? ?New onset A-fib: Currently converted to sinus rhythm.  Remains on amiodarone.  Therapeutic on heparin. ?We will need to change on oral rate control medication and DOAC. ? ?Goal of care: ?See palliative care discussion.  Patient agreeable to DNR/DNI with full scope of treatment at this time.  Not quite ready to accept hospice level of care.  She will best benefit with hospice at home once stabilized.  Ongoing discussions. ? ?DVT prophylaxis: Place TED hose Start: 07/14/21 1246  Heparin infusion. ? ? ?Code Status: Full code. ?Family Communication: None. ?Disposition Plan: Status is: Inpatient ?Remains inpatient appropriate because: Severe cardiovascular disease.  On vasoactive infusions. ?  ? ? ?Consultants:  ?Cardiology ?Palliative care ? ?Procedures:  ?None ? ?Antimicrobials:  ?None ? ? ?Subjective: ? ?Seen and examined.  Denies any chest pain or palpitations.  No overnight events.  Poor sleep because of dry hacking cough.  We will use some Tessalon Perles. ?Telemetry shows sinus rhythm with frequent PVCs.  Patient without palpitations. ? ?Objective: ?Vitals:  ? 07/15/21 2040 07/16/21 0300 07/16/21 0530 07/16/21 0806  ?BP: 131/61 (!) 158/84 107/75 (!) 103/59  ?Pulse: 72 79 83 82  ?Resp: 20 20 20 18   ?Temp: 97.8 ?F (36.6 ?C) 97.6 ?F (36.4 ?C) (!) 97.5 ?F (36.4 ?C) Marland Kitchen)  97.2 ?F (36.2 ?C)  ?TempSrc: Oral Oral Axillary Oral  ?SpO2: 97% 97% 99% 94%  ?Weight:   66.9 kg   ?Height:      ? ? ?Intake/Output Summary (Last 24 hours) at 07/16/2021 0820 ?Last data filed at 07/16/2021  0300 ?Gross per 24 hour  ?Intake 1018.93 ml  ?Output 1700 ml  ?Net -681.07 ml  ? ? ?Filed Weights  ? 07/14/21 1317 07/15/21 0520 07/16/21 0530  ?Weight: 70.1 kg 66.5 kg 66.9 kg  ? ? ?Examination: ? ?General exam: Slightly anxious.  Tired today.  Calm and comfortable.  Frail and debilitated but on room air and comfortable at this time.   ?Respiratory system: Bilateral posterior basal crackles. ?Cardiovascular system: S1 & S2 heard, RRR.  Engorged jugular vein.  Pansystolic murmur at the mitral area. ?1+ bilateral pedal edema.  Now on compression stockings. ?Gastrointestinal system: Abdomen is nondistended, soft and nontender. No organomegaly or masses felt. Normal bowel sounds heard. ?Central nervous system: Alert and oriented. No focal neurological deficits. ?Extremities: Symmetric 5 x 5 power.  Generalized weakness. ? ? ? ? ?Data Reviewed: I have personally reviewed following labs and imaging studies ? ?CBC: ?Recent Labs  ?Lab 07/11/21 ?1520 07/12/21 ?0431 07/13/21 ?1610 07/14/21 ?0350 07/15/21 ?9604 07/16/21 ?5409  ?WBC 11.0* 11.5* 9.3 9.3 7.6 6.6  ?NEUTROABS 10.2* 9.8* 7.6  --   --   --   ?HGB 11.2* 10.3* 9.4* 9.3* 10.1* 9.7*  ?HCT 36.8 32.6* 29.9* 29.2* 30.5* 28.6*  ?MCV 95.8 92.9 91.7 91.5 89.4 86.4  ?PLT 202 178 187 194 207 190  ? ?Basic Metabolic Panel: ?Recent Labs  ?Lab 07/12/21 ?0050 07/12/21 ?0431 07/12/21 ?1059 07/13/21 ?0507 07/14/21 ?0350 07/15/21 ?8119 07/16/21 ?1478  ?NA  --  138 137 137 130* 132* 127*  ?K  --  2.8* 3.3* 4.0 4.7 3.8 3.1*  ?CL  --  107 106 106 100 99 92*  ?CO2  --  19* 17* 19* 17* 20* 19*  ?GLUCOSE  --  108* 128* 188* 231* 160* 193*  ?BUN  --  73* 72* 77* 79* 78* 76*  ?CREATININE  --  2.07* 2.08* 2.33* 2.48* 2.59* 2.49*  ?CALCIUM  --  8.1* 8.1* 7.9* 7.6* 7.9* 7.9*  ?MG 2.7* 1.9  --  2.2  --   --   --   ?PHOS  --  5.2*  --   --   --   --   --   ? ?GFR: ?Estimated Creatinine Clearance: 16.3 mL/min (A) (by C-G formula based on SCr of 2.49 mg/dL (H)). ?Liver Function Tests: ?Recent Labs   ?Lab 07/11/21 ?1520 07/12/21 ?0431 07/13/21 ?0507  ?AST 138* 92* 80*  ?ALT 519* 413* 319*  ?ALKPHOS 138* 121 140*  ?BILITOT 1.5* 1.5* 1.6*  ?PROT 7.1 6.7 6.7  ?ALBUMIN 2.9* 2.6* 2.5*  ? ?Recent Labs  ?Lab 07/11/21 ?2035 07/12/21 ?0431  ?LIPASE 68* 59*  ? ?No results for input(s): AMMONIA in the last 168 hours. ?Coagulation Profile: ?No results for input(s): INR, PROTIME in the last 168 hours. ?Cardiac Enzymes: ?No results for input(s): CKTOTAL, CKMB, CKMBINDEX, TROPONINI in the last 168 hours. ?BNP (last 3 results) ?No results for input(s): PROBNP in the last 8760 hours. ?HbA1C: ?No results for input(s): HGBA1C in the last 72 hours. ?CBG: ?Recent Labs  ?Lab 07/15/21 ?0721 07/15/21 ?1145 07/15/21 ?1650 07/15/21 ?2114 07/16/21 ?0805  ?GLUCAP 154* 206* 191* 236* 166*  ? ?Lipid Profile: ?No results for input(s): CHOL, HDL, LDLCALC, TRIG, CHOLHDL, LDLDIRECT in the  last 72 hours. ?Thyroid Function Tests: ?No results for input(s): TSH, T4TOTAL, FREET4, T3FREE, THYROIDAB in the last 72 hours. ?Anemia Panel: ?No results for input(s): VITAMINB12, FOLATE, FERRITIN, TIBC, IRON, RETICCTPCT in the last 72 hours. ?Sepsis Labs: ?No results for input(s): PROCALCITON, LATICACIDVEN in the last 168 hours. ? ?No results found for this or any previous visit (from the past 240 hour(s)).  ? ? ? ? ? ?Radiology Studies: ?No results found. ? ? ? ? ? ?Scheduled Meds: ? aspirin EC  81 mg Oral Daily  ? Chlorhexidine Gluconate Cloth  6 each Topical Daily  ? dextromethorphan  60 mg Oral BID  ? furosemide  80 mg Intravenous BID  ? hydrALAZINE  12.5 mg Oral Q8H  ? insulin aspart  0-5 Units Subcutaneous QHS  ? insulin aspart  0-9 Units Subcutaneous TID WC  ? insulin glargine-yfgn  10 Units Subcutaneous Daily  ? isosorbide mononitrate  60 mg Oral Daily  ? levothyroxine  88 mcg Oral QAC breakfast  ? metoprolol succinate  12.5 mg Oral QHS  ? pantoprazole  40 mg Oral Daily  ? potassium chloride  40 mEq Oral TID  ? senna-docusate  2 tablet Oral QHS  ?  sodium chloride flush  10-40 mL Intracatheter Q12H  ? ticagrelor  90 mg Oral BID  ? ?Continuous Infusions: ? amiodarone 30 mg/hr (07/16/21 0312)  ? heparin 900 Units/hr (07/15/21 1921)  ? milrinone 0.125 mcg/kg/

## 2021-07-17 DIAGNOSIS — I5023 Acute on chronic systolic (congestive) heart failure: Secondary | ICD-10-CM | POA: Diagnosis not present

## 2021-07-17 DIAGNOSIS — R079 Chest pain, unspecified: Secondary | ICD-10-CM | POA: Diagnosis not present

## 2021-07-17 LAB — CBC
HCT: 29.4 % — ABNORMAL LOW (ref 36.0–46.0)
Hemoglobin: 10 g/dL — ABNORMAL LOW (ref 12.0–15.0)
MCH: 29.5 pg (ref 26.0–34.0)
MCHC: 34 g/dL (ref 30.0–36.0)
MCV: 86.7 fL (ref 80.0–100.0)
Platelets: 196 10*3/uL (ref 150–400)
RBC: 3.39 MIL/uL — ABNORMAL LOW (ref 3.87–5.11)
RDW: 21 % — ABNORMAL HIGH (ref 11.5–15.5)
WBC: 6.3 10*3/uL (ref 4.0–10.5)
nRBC: 2.1 % — ABNORMAL HIGH (ref 0.0–0.2)

## 2021-07-17 LAB — COOXEMETRY PANEL
Carboxyhemoglobin: 1.3 % (ref 0.5–1.5)
Carboxyhemoglobin: 1.3 % (ref 0.5–1.5)
Methemoglobin: <0.7 % (ref 0.0–1.5)
Methemoglobin: <0.7 % (ref 0.0–1.5)
O2 Saturation: 41.9 %
O2 Saturation: 53.8 %
Total hemoglobin: 10.3 g/dL — ABNORMAL LOW (ref 12.0–16.0)
Total hemoglobin: 9.4 g/dL — ABNORMAL LOW (ref 12.0–16.0)

## 2021-07-17 LAB — HEPARIN LEVEL (UNFRACTIONATED)
Heparin Unfractionated: 1.02 IU/mL — ABNORMAL HIGH (ref 0.30–0.70)
Heparin Unfractionated: >1.1 IU/mL — ABNORMAL HIGH (ref 0.30–0.70)
Heparin Unfractionated: 0.45 IU/mL (ref 0.30–0.70)

## 2021-07-17 LAB — BASIC METABOLIC PANEL
Anion gap: 14 (ref 5–15)
BUN: 74 mg/dL — ABNORMAL HIGH (ref 8–23)
CO2: 19 mmol/L — ABNORMAL LOW (ref 22–32)
Calcium: 8.1 mg/dL — ABNORMAL LOW (ref 8.9–10.3)
Chloride: 94 mmol/L — ABNORMAL LOW (ref 98–111)
Creatinine, Ser: 2.68 mg/dL — ABNORMAL HIGH (ref 0.44–1.00)
GFR, Estimated: 18 mL/min — ABNORMAL LOW (ref >60)
Glucose, Bld: 194 mg/dL — ABNORMAL HIGH (ref 70–99)
Potassium: 4.7 mmol/L (ref 3.5–5.1)
Sodium: 127 mmol/L — ABNORMAL LOW (ref 135–145)

## 2021-07-17 LAB — GLUCOSE, CAPILLARY
Glucose-Capillary: 181 mg/dL — ABNORMAL HIGH (ref 70–99)
Glucose-Capillary: 161 mg/dL — ABNORMAL HIGH (ref 70–99)
Glucose-Capillary: 186 mg/dL — ABNORMAL HIGH (ref 70–99)
Glucose-Capillary: 170 mg/dL — ABNORMAL HIGH (ref 70–99)

## 2021-07-17 MED ORDER — LORAZEPAM 0.5 MG PO TABS
0.5000 mg | ORAL_TABLET | Freq: Four times a day (QID) | ORAL | Status: DC | PRN
  Administered 2021-07-18: 0.5 mg via ORAL
  Filled 2021-07-17 (×2): qty 1

## 2021-07-17 NOTE — Progress Notes (Signed)
?PROGRESS NOTE ? ? ? ?ALLYSHA TRYON  LNL:[id number] DOB: 1940-12-06 DOA: 07/11/2021 ?PCP: Deland Pretty, MD  ? ? 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  In the emergency room afebrile.  Blood pressure stable.  Respiration 30.  93% on room air.  Potassium 3, creatinine 2.34, transaminases elevated. ? ? ?Assessment & Plan: ?  ?Acute on chronic systolic congestive heart failure. ?Ischemic cardiomyopathy with known ejection fraction 25 to 30% ?Severe mitral regurgitation, not a surgical candidate.  Fluid overload secondary to valvular heart disease ?Coronary artery disease status post coronary stents on dual antiplatelet therapy. ? ?Patient with severe cardiovascular disease.  Currently managed by heart failure team with ?Milrinone infusion 0.125 mcg/kg/min, Lasix 80 mg IV twice daily.   ?On home dose of metoprolol.  ?Continue strict intake and output monitoring.  Advanced heart failure therapy as per cardiology. ?Patient is on aspirin and Brilinta after recent coronary stent, started on heparin for acute DVT.  Severe mitral regurgitation possibly complicating fluid retention.  Not a surgical candidate. ?Patient with advanced cardiovascular disease with high mortality.  ?Seen by palliative care team.  Agreed for DNR/DNI.  Continue full scope of treatment.  May benefit with home hospice but she is not ready yet.  ? ?Acute left lower extremity DVT: Started on heparin.  Will need DOAC.  As above, hopefully she can be off aspirin to  avoid bleeding risk.   ? ?Acute on chronic kidney disease stage IV: Creatinine about 2.5-2.6.  This may be her new baseline. ? ?Hypokalemia: Replaced and adequate. ? ?Congestive hepatopathy: LFTs stable. ? ?Type 2 diabetes, well controlled.  On insulin at home.  Remains on insulin and is stable. ? ?New onset A-fib: Currently converted to sinus rhythm.  Remains on amiodarone.  Therapeutic on heparin. ?We will need to change on oral rate control medication and DOAC. ? ?Goal of care: ?See palliative care discussion.  Patient agreeable to DNR/DNI with full scope of treatment at this time.  Not quite ready to accept hospice level of care.  She will best benefit with hospice at home once stabilized.  Ongoing discussions. ?Patient complained of poor sleep and anxiety today, will try low-dose Ativan. ? ?DVT prophylaxis: Place TED hose Start: 07/14/21 1246  Heparin infusion. ? ? ?Code Status: Full code. ?Family Communication: Sister at the bedside. ?Disposition Plan: Status is: Inpatient ?Remains inpatient appropriate because: Severe cardiovascular disease.  On vasoactive infusions. ?  ? ? ?Consultants:  ?Cardiology ?Palliative care ? ?Procedures:  ?None ? ?Antimicrobials:  ?None ? ? ?Subjective: ? ?Patient seen and examined.  One of her sisters arrived from [place] and stayed with her overnight.  Patient did not get good sleep.  She is tired and anxious.  She is ready to try some medications, will write some Ativan. ?Tessalon Perles helped with suppressing cough. ?She looks more tired today. ? ?Objective: ?Vitals:  ? 07/17/21 0417 07/17/21 0425 07/17/21 0843 07/17/21 1241  ?BP: 116/72  (!) 156/102 (!) 140/115  ?Pulse: 77  70   ?Resp: 20  20 20   ?Temp: 97.6 ?F (36.4 ?C)  97.7 ?F (36.5 ?C)  98.7 ?F (37.1 ?C)  ?TempSrc: Oral  Oral Oral  ?SpO2:   95% 97%  ?Weight:  66.3 kg    ?Height:      ? ? ?Intake/Output Summary (Last 24 hours) at 07/17/2021 1345 ?Last data filed at 07/17/2021 0400 ?Gross per 24 hour  ?Intake 360 ml  ?Output  2700 ml  ?Net -2340 ml  ? ? ?Filed Weights  ? 07/15/21 0520 07/16/21 0530 07/17/21 0425  ?Weight: 66.5 kg 66.9 kg 66.3 kg  ? ? ?Examination: ? ?General exam: Tired.  Lethargic.  Flat affect.  Anxious. ?Respiratory system: Bilateral posterior basal crackles. ?Cardiovascular system: S1 & S2 heard, RRR.  Engorged jugular vein.  Pansystolic murmur at the mitral area. ?1+ bilateral pedal edema.  Now on compression stockings. ?Gastrointestinal system: Abdomen is nondistended, soft and nontender. No organomegaly or masses felt. Normal bowel sounds heard. ?Central nervous system: Alert and oriented. No focal neurological deficits. ?Extremities: Symmetric 5 x 5 power.  Generalized weakness. ? ? ? ? ?Data Reviewed: I have personally reviewed following labs and imaging studies ? ?CBC: ?Recent Labs  ?Lab 07/11/21 ?1520 07/12/21 ?0431 07/13/21 ?6503 07/14/21 ?0350 07/15/21 ?5465 07/16/21 ?6812 07/17/21 ?0446  ?WBC 11.0* 11.5* 9.3 9.3 7.6 6.6 6.3  ?NEUTROABS 10.2* 9.8* 7.6  --   --   --   --   ?HGB 11.2* 10.3* 9.4* 9.3* 10.1* 9.7* 10.0*  ?HCT 36.8 32.6* 29.9* 29.2* 30.5* 28.6* 29.4*  ?MCV 95.8 92.9 91.7 91.5 89.4 86.4 86.7  ?PLT 202 178 187 194 207 190 196  ? ?Basic Metabolic Panel: ?Recent Labs  ?Lab 07/12/21 ?0050 07/12/21 ?0431 07/12/21 ?1059 07/13/21 ?0507 07/14/21 ?0350 07/15/21 ?7517 07/16/21 ?0017 07/17/21 ?0446  ?NA  --  138   < > 137 130* 132* 127* 127*  ?K  --  2.8*   < > 4.0 4.7 3.8 3.1* 4.7  ?CL  --  107   < > 106 100 99 92* 94*  ?CO2  --  19*   < > 19* 17* 20* 19* 19*  ?GLUCOSE  --  108*   < > 188* 231* 160* 193* 194*  ?BUN  --  73*   < > 77* 79* 78* 76* 74*  ?CREATININE  --  2.07*   < > 2.33* 2.48* 2.59* 2.49* 2.68*  ?CALCIUM  --  8.1*   < > 7.9* 7.6* 7.9* 7.9* 8.1*  ?MG 2.7* 1.9  --  2.2  --   --   --   --   ?PHOS  --  5.2*  --   --   --   --   --   --   ? < > = values in this interval not displayed.  ? ?GFR: ?Estimated Creatinine Clearance: 15.1 mL/min (A) (by C-G formula based on SCr of 2.68 mg/dL (H)). ?Liver  Function Tests: ?Recent Labs  ?Lab 07/11/21 ?1520 07/12/21 ?0431 07/13/21 ?0507  ?AST 138* 92* 80*  ?ALT 519* 413* 319*  ?ALKPHOS 138* 121 140*  ?BILITOT 1.5* 1.5* 1.6*  ?PROT 7.1 6.7 6.7  ?ALBUMIN 2.9* 2.6* 2.5*  ? ?Recent Labs  ?Lab 07/11/21 ?2035 07/12/21 ?0431  ?LIPASE 68* 59*  ? ?No results for input(s): AMMONIA in the last 168 hours. ?Coagulation Profile: ?No results for input(s): INR, PROTIME in the last 168 hours. ?Cardiac Enzymes: ?No results for input(s): CKTOTAL, CKMB, CKMBINDEX, TROPONINI in the last 168 hours. ?BNP (last 3 results) ?No results for input(s): PROBNP in the last 8760 hours. ?HbA1C: ?No results for  input(s): HGBA1C in the last 72 hours. ?CBG: ?Recent Labs  ?Lab 07/16/21 ?1129 07/16/21 ?1700 07/16/21 ?2106 07/17/21 ?3299 07/17/21 ?1206  ?GLUCAP 159* 192* 168* 181* 161*  ? ?Lipid Profile: ?No results for input(s): CHOL, HDL, LDLCALC, TRIG, CHOLHDL, LDLDIRECT in the last 72 hours. ?Thyroid Function Tests: ?No results for input(s): TSH, T4TOTAL, FREET4, T3FREE, THYROIDAB in the last 72 hours. ?Anemia Panel: ?No results for input(s): VITAMINB12, FOLATE, FERRITIN, TIBC, IRON, RETICCTPCT in the last 72 hours. ?Sepsis Labs: ?No results for input(s): PROCALCITON, LATICACIDVEN in the last 168 hours. ? ?No results found for this or any previous visit (from the past 240 hour(s)).  ? ? ? ? ? ?Radiology Studies: ?No results found. ? ? ? ? ? ?Scheduled Meds: ? aspirin EC  81 mg Oral Daily  ? Chlorhexidine Gluconate Cloth  6 each Topical Daily  ? dextromethorphan  60 mg Oral BID  ? furosemide  80 mg Intravenous BID  ? insulin aspart  0-5 Units Subcutaneous QHS  ? insulin aspart  0-9 Units Subcutaneous TID WC  ? insulin glargine-yfgn  10 Units Subcutaneous Daily  ? levothyroxine  88 mcg Oral QAC breakfast  ? metoprolol succinate  12.5 mg Oral QHS  ? pantoprazole  40 mg Oral Daily  ? senna-docusate  2 tablet Oral QHS  ? sodium chloride flush  10-40 mL Intracatheter Q12H  ? ticagrelor  90 mg Oral BID   ? ?Continuous Infusions: ? amiodarone 30 mg/hr (07/17/21 0201)  ? heparin 900 Units/hr (07/16/21 2034)  ? milrinone 0.25 mcg/kg/min (07/16/21 1714)  ? ? ? LOS: 5 days  ? ? ?Time spent: 35 minutes ? ? ? ?Dante Gang

## 2021-07-17 NOTE — Progress Notes (Signed)
ANTICOAGULATION CONSULT NOTE - Follow Up Consult ? ?Pharmacy Consult for Heparin ?Indication: LLE DVT / Afib ? ?Allergies  ?Allergen Reactions  ? Tramadol Nausea And Vomiting and Other (See Comments)  ?  "got sick to my stomach and was throwing up blood; it put me in the hospital")  ? Lisinopril Rash and Other (See Comments)  ?  Renal failure ?  ? Rosuvastatin Other (See Comments)  ?  Other reaction(s): rhabdo  ? Tapazole [Methimazole] Itching and Rash  ? ? ?Patient Measurements: ?Height: 5\' 1"  (154.9 cm) ?Weight: 66.3 kg (146 lb 1.6 oz) ?IBW/kg (Calculated) : 47.8 ?Heparin Dosing Weight: 62 kg ? ?Vital Signs: ?Temp: 98.7 ?F (37.1 ?C) (04/30 1241) ?Temp Source: Oral (04/30 1241) ?BP: 140/115 (04/30 1241) ?Pulse Rate: 70 (04/30 0843) ? ?Labs: ?Recent Labs  ?  07/15/21 ?5038 07/16/21 ?8828 07/16/21 ?0034 07/17/21 ?0446 07/17/21 ?9179 07/17/21 ?0800 07/17/21 ?1154  ?HGB 10.1*  --  9.7* 10.0*  --   --   --   ?HCT 30.5*  --  28.6* 29.4*  --   --   --   ?PLT 207  --  190 196  --   --   --   ?HEPARINUNFRC 0.36   < >  --   --  1.02* >1.10* 0.45  ?CREATININE 2.59*  --  2.49* 2.68*  --   --   --   ? < > = values in this interval not displayed.  ? ? ? ?Estimated Creatinine Clearance: 15.1 mL/min (A) (by C-G formula based on SCr of 2.68 mg/dL (H)). ? ?Assessment: ?81 yo female with LLE DVT per duplex and started IV heparin on 07/12/21. No anticoagulants noted PTA but noted on ticagrelor and DES placed 04/23/21.  Had received Enoxaparin 30 mg x 1 on 4/25 at ~9am. ?Heparin drip 900 uts/hr running through PICC line with restricted arm access, monitor for accuracy of heparin levels  ?Heparin level 0.35 this am at goal  ?Cbc stable no bleeding noted  ?Plan for oral anticoagulation when procedures are completed ? ?Heparin level came back therapeutic today. Hgb has been stable.  ? ?Goal of Therapy:  ?Heparin level 0.3-0.7 units/ml ?Monitor platelets by anticoagulation protocol: Yes ?  ?Plan:  ?Continue heparin drip at 900 units/hr ?Next  heparin level and CBC in am. ? ?Onnie Boer, PharmD, BCIDP, AAHIVP, CPP ?Infectious Disease Pharmacist ?07/17/2021 1:44 PM ? ? ? ? ? ?

## 2021-07-17 NOTE — Progress Notes (Signed)
? ? Advanced Heart Failure Rounding Note ? ?PCP-Cardiologist: Minus Breeding, MD  ?AHF: Dr. Haroldine Laws  ? ?Subjective:   ? ?4/25: Admitted w/ a/c CHF w/ low output. PICC placed. Initial Co-ox 57%. Started on milrinone 0.25 + IV Lasix.   ?4/25 LE Venous Dopplers + for left DVT. On heparin gtt.  ?4/26 A fib RVR. Loaded on amio. Milrinone stopped. CO-OX 43%. Milrinone 0.125 mcg restarted  ?4/29 Milrinone increased to 0.25  ? ?Currently on amio drip @ 30/hr and milrinone 0.25 mcg. CO-OX 42% CVP 18 -> 16 ?Bicarb 19. Scr 2.5 -> 2.7 ? ?Remains on IV lasix. 2.7 liters out. Weight down 1 pounds ? ?SOB at rest. + orthopnea and PND. No CP ? ?Has been seen by Palliative Care. Now DNR/DNI  ? ?Objective:   ?Weight Range: ?66.3 kg ?Body mass index is 27.61 kg/m?.  ? ?Vital Signs:   ?Temp:  [97.4 ?F (36.3 ?C)-97.7 ?F (36.5 ?C)] 97.7 ?F (36.5 ?C) (04/30 4097) ?Pulse Rate:  [70-78] 70 (04/30 0843) ?Resp:  [19-20] 20 (04/30 0843) ?BP: (116-156)/(72-102) 156/102 (04/30 0843) ?SpO2:  [95 %] 95 % (04/30 0843) ?Weight:  [66.3 kg] 66.3 kg (04/30 0425) ?Last BM Date : 07/13/21 ? ?Weight change: ?Filed Weights  ? 07/15/21 0520 07/16/21 0530 07/17/21 0425  ?Weight: 66.5 kg 66.9 kg 66.3 kg  ? ? ?Intake/Output:  ? ?Intake/Output Summary (Last 24 hours) at 07/17/2021 1058 ?Last data filed at 07/17/2021 0400 ?Gross per 24 hour  ?Intake 360 ml  ?Output 2700 ml  ?Net -2340 ml  ? ? ?  ? ? ?Physical Exam  ? ?General:  Sitting up. SOB with talking ?HEENT: normal ?Neck: supple. JVP to ear. Carotids 2+ bilat; no bruits. No lymphadenopathy or thryomegaly appreciated. ?Cor: PMI nondisplaced. Regular rate & rhythm. 3/6 MR ?Lungs: + crackles ?Abdomen: soft, nontender, nondistended. No hepatosplenomegaly. No bruits or masses. Good bowel sounds. ?Extremities: no cyanosis, clubbing, rash, 2+ edema ?Neuro: alert & orientedx3, cranial nerves grossly intact. moves all 4 extremities w/o difficulty. Affect pleasant ? ? ? ?Telemetry  ? ?SR 70-80s +PACs/PVCs  Personally reviewed ? ? ?Labs  ?  ?CBC ?Recent Labs  ?  07/16/21 ?3532 07/17/21 ?0446  ?WBC 6.6 6.3  ?HGB 9.7* 10.0*  ?HCT 28.6* 29.4*  ?MCV 86.4 86.7  ?PLT 190 196  ? ? ?Basic Metabolic Panel ?Recent Labs  ?  07/16/21 ?9924 07/17/21 ?0446  ?NA 127* 127*  ?K 3.1* 4.7  ?CL 92* 94*  ?CO2 19* 19*  ?GLUCOSE 193* 194*  ?BUN 76* 74*  ?CREATININE 2.49* 2.68*  ?CALCIUM 7.9* 8.1*  ? ? ?Liver Function Tests ?No results for input(s): AST, ALT, ALKPHOS, BILITOT, PROT, ALBUMIN in the last 72 hours. ? ?No results for input(s): LIPASE, AMYLASE in the last 72 hours. ? ?Cardiac Enzymes ?No results for input(s): CKTOTAL, CKMB, CKMBINDEX, TROPONINI in the last 72 hours. ? ?BNP: ?BNP (last 3 results) ?Recent Labs  ?  04/27/21 ?2683 06/17/21 ?0957 06/29/21 ?1818  ?BNP 1,077.8* 2,771.3* 3,309.2*  ? ? ? ?ProBNP (last 3 results) ?No results for input(s): PROBNP in the last 8760 hours. ? ? ?D-Dimer ?No results for input(s): DDIMER in the last 72 hours. ?Hemoglobin A1C ?No results for input(s): HGBA1C in the last 72 hours. ?Fasting Lipid Panel ?No results for input(s): CHOL, HDL, LDLCALC, TRIG, CHOLHDL, LDLDIRECT in the last 72 hours. ?Thyroid Function Tests ?No results for input(s): TSH, T4TOTAL, T3FREE, THYROIDAB in the last 72 hours. ? ?Invalid input(s): FREET3 ? ?Other results: ? ? ?Imaging  ? ? ?  No results found. ? ? ?Medications:   ? ? ?Scheduled Medications: ? aspirin EC  81 mg Oral Daily  ? Chlorhexidine Gluconate Cloth  6 each Topical Daily  ? dextromethorphan  60 mg Oral BID  ? furosemide  80 mg Intravenous BID  ? insulin aspart  0-5 Units Subcutaneous QHS  ? insulin aspart  0-9 Units Subcutaneous TID WC  ? insulin glargine-yfgn  10 Units Subcutaneous Daily  ? levothyroxine  88 mcg Oral QAC breakfast  ? metoprolol succinate  12.5 mg Oral QHS  ? pantoprazole  40 mg Oral Daily  ? senna-docusate  2 tablet Oral QHS  ? sodium chloride flush  10-40 mL Intracatheter Q12H  ? ticagrelor  90 mg Oral BID  ? ? ?Infusions: ? amiodarone 30 mg/hr  (07/17/21 0201)  ? heparin 900 Units/hr (07/16/21 2034)  ? milrinone 0.25 mcg/kg/min (07/16/21 1714)  ? ? ?PRN Medications: ?benzonatate, HYDROmorphone (DILAUDID) injection, LORazepam, melatonin, nitroGLYCERIN, oxyCODONE, polyethylene glycol, prochlorperazine, sodium chloride flush ? ? ? ?Patient Profile  ? ?81 y.o. female with CAD s/p recent inferolateral STEMI 2/23 w/ PCI to the RCA, severe (suspected infarct related) mitral regurgitation not candidate for TEER and being considered for Percutaneous MV replacement at [facility], chronic systolic heart failure w/ EF 35-40%, Stage IV CKD, HTN, anemia, DM and sleep apnea.  ? ? ?Admitted with increased dyspnea/abdominal pain in the setting of A/C HFrEF and suspected low output and AKI on CKD. Also found to have Lt LE DVT.  ? ?Assessment/Plan  ? ?1. A/C HFrEF, ICM  ?-Echo (2/23): EF 30-35% and severe MR ?-TEE (2/23): 35-40% severe MR ?-RHC (2/23): showed normal cardiac output  ?-Presented increased dyspnea and abd pain and low output, Co-ox 57% ?-needs surgical management of severe MR however not current surgical candidate w/ co morbidities.  ?- Echo 4/23  EF 35-40% RV normal. Severe MR ?- Milrinone increased to 0.25 yesterday. Co-ox down to 42%. CVP high. SCr getting worse ?- Continues to deteriorate with cardiorenal syndrome.  ?- Not much left to offer at this point. Will increase milrinone to 0.375. I d/w Dr.Thukkani again today. Will [facility] out to [facility]. If they would consider her for tMVR if Scr improves then could consider placing IABP and moving to ICU to see if we could temporize her. Otherwise plan will be to consider transition to comfort care as her breathing worsens  ?  ?2. Transaminitis  ?- Abd [place] neg for cholecystitis.  ?- Due to hepatic congestion/low output hf.  ?- LFTs downtrending  ?  ?3. Severe MR  ?- TEE 2/23 reviewed EF 35-40% severe MR. ?- Reviewed with Structural Team. Not candidate for MV Clip due to perivalvular calcium and short  posterior leaflet. Dr Ali Lowe referred to [address] for possible percutaneous MVR, however not current candidate for surgery given co morbidities.  ? ?4. CKD Stage IV ?-Recent creatinine baseline 2.2-2.4 ?-SCr 2.08>>2.33 >>2.48>>2.59 > 2.49 > 2.68 ?-Suspect cardiorenal in setting of low output HF  ?-continue milrinone  ?  ?5. Anemia of chronic illness ?-Hgb 10.3>>9.4>>9.3>10.1 > 10.0 ?-monitor w/ heparin gtt, asa and Brilinta  ?  ?6. DMII ?-Hgb A1C 6 04/2021  ?-On sliding scale.  ?  ?7. CAD  ? Late presentation STEMI that compounded her chronic moderate mitral regurgitation.  ?-Had recent DES to LCx. On asa and brillinta. Resume statin ?-HS Trop 81>71>87 ?-No s/s angina ?-she now has LE DVT and will need a/c. PCI > 30 days ago. Can stop ASA once transitioned  to [place]. Will need to change Brilitna to Plavix to reduce bleed risk given chronic anemia. But doubt she will be able to get out of hospital with her severe HF ? - Continue PPI    ?  ?8. PVCs  ?-continue amio drip.  ? ?9. LLE DVT ?- new this admit, on heparin gtt. PE has not been ruled out. No chest CT w/ CKD. ?-repeat limited echo w/ normal RV  ? ?10.  Paroxysmal A Fib RVR ?-4/26 Developed  A fib RVR. Loading amio drip. Converted to SR.  ?- Continue amio while on milrinone.  ?-Continue heparin drip.  ? ?CRITICAL CARE ?Performed by: Glori Bickers ? ?Total critical care time: 40 minutes ? ?Critical care time was exclusive of separately billable procedures and treating other patients. ? ?Critical care was necessary to treat or prevent imminent or life-threatening deterioration. ? ?Critical care was time spent personally by me (independent of midlevel providers or residents) on the following activities: development of treatment plan with patient and/or surrogate as well as nursing, discussions with consultants, evaluation of patient's response to treatment, examination of patient, obtaining history from patient or surrogate, ordering and performing treatments and  interventions, ordering and review of laboratory studies, ordering and review of radiographic studies, pulse oximetry and re-evaluation of patient's condition. ? ? ? ?Length of Stay: 5 ? ?Glori Bickers, MD

## 2021-07-17 NOTE — TOC CM/SW Note (Signed)
HF TOC CM spoke to pt and dtr at bedside. Pt had [employer] in the past. Contacted [employer] rep, Ramond Marrow with new referral. Will need HHPT/RN/OT orders with [facility]. Jonnie Finner RN3 CCM, Heart Failure TOC CM [phone]  ?

## 2021-07-18 ENCOUNTER — Telehealth: Payer: Self-pay | Admitting: Internal Medicine

## 2021-07-18 DIAGNOSIS — E118 Type 2 diabetes mellitus with unspecified complications: Secondary | ICD-10-CM

## 2021-07-18 DIAGNOSIS — I5020 Unspecified systolic (congestive) heart failure: Secondary | ICD-10-CM

## 2021-07-18 DIAGNOSIS — Z7189 Other specified counseling: Secondary | ICD-10-CM | POA: Diagnosis not present

## 2021-07-18 DIAGNOSIS — I5022 Chronic systolic (congestive) heart failure: Secondary | ICD-10-CM

## 2021-07-18 DIAGNOSIS — I5023 Acute on chronic systolic (congestive) heart failure: Secondary | ICD-10-CM | POA: Diagnosis not present

## 2021-07-18 DIAGNOSIS — R7989 Other specified abnormal findings of blood chemistry: Secondary | ICD-10-CM

## 2021-07-18 DIAGNOSIS — R079 Chest pain, unspecified: Secondary | ICD-10-CM | POA: Diagnosis not present

## 2021-07-18 DIAGNOSIS — I340 Nonrheumatic mitral (valve) insufficiency: Secondary | ICD-10-CM

## 2021-07-18 DIAGNOSIS — Z66 Do not resuscitate: Secondary | ICD-10-CM | POA: Diagnosis not present

## 2021-07-18 DIAGNOSIS — Z515 Encounter for palliative care: Secondary | ICD-10-CM | POA: Diagnosis not present

## 2021-07-18 LAB — BASIC METABOLIC PANEL
Anion gap: 14 (ref 5–15)
BUN: 74 mg/dL — ABNORMAL HIGH (ref 8–23)
CO2: 21 mmol/L — ABNORMAL LOW (ref 22–32)
Calcium: 8.3 mg/dL — ABNORMAL LOW (ref 8.9–10.3)
Chloride: 94 mmol/L — ABNORMAL LOW (ref 98–111)
Creatinine, Ser: 2.62 mg/dL — ABNORMAL HIGH (ref 0.44–1.00)
GFR, Estimated: 18 mL/min — ABNORMAL LOW (ref >60)
Glucose, Bld: 132 mg/dL — ABNORMAL HIGH (ref 70–99)
Potassium: 4 mmol/L (ref 3.5–5.1)
Sodium: 129 mmol/L — ABNORMAL LOW (ref 135–145)

## 2021-07-18 LAB — CBC
HCT: 29.8 % — ABNORMAL LOW (ref 36.0–46.0)
Hemoglobin: 10 g/dL — ABNORMAL LOW (ref 12.0–15.0)
MCH: 28.8 pg (ref 26.0–34.0)
MCHC: 33.6 g/dL (ref 30.0–36.0)
MCV: 85.9 fL (ref 80.0–100.0)
Platelets: 207 10*3/uL (ref 150–400)
RBC: 3.47 MIL/uL — ABNORMAL LOW (ref 3.87–5.11)
RDW: 21.1 % — ABNORMAL HIGH (ref 11.5–15.5)
WBC: 6.7 10*3/uL (ref 4.0–10.5)
nRBC: 1.3 % — ABNORMAL HIGH (ref 0.0–0.2)

## 2021-07-18 LAB — GLUCOSE, CAPILLARY
Glucose-Capillary: 140 mg/dL — ABNORMAL HIGH (ref 70–99)
Glucose-Capillary: 197 mg/dL — ABNORMAL HIGH (ref 70–99)
Glucose-Capillary: 362 mg/dL — ABNORMAL HIGH (ref 70–99)
Glucose-Capillary: 234 mg/dL — ABNORMAL HIGH (ref 70–99)

## 2021-07-18 LAB — COOXEMETRY PANEL
Carboxyhemoglobin: 1.3 % (ref 0.5–1.5)
Carboxyhemoglobin: 2.4 % — ABNORMAL HIGH (ref 0.5–1.5)
Methemoglobin: 1 % (ref 0.0–1.5)
Methemoglobin: <0.7 % (ref 0.0–1.5)
O2 Saturation: 47.9 %
O2 Saturation: 70.2 %
Total hemoglobin: 10.3 g/dL — ABNORMAL LOW (ref 12.0–16.0)
Total hemoglobin: 10.5 g/dL — ABNORMAL LOW (ref 12.0–16.0)

## 2021-07-18 LAB — URIC ACID: Uric Acid, Serum: 20.8 mg/dL — ABNORMAL HIGH (ref 2.5–7.1)

## 2021-07-18 LAB — HEPARIN LEVEL (UNFRACTIONATED): Heparin Unfractionated: 0.61 IU/mL (ref 0.30–0.70)

## 2021-07-18 MED ORDER — PREDNISONE 20 MG PO TABS
40.0000 mg | ORAL_TABLET | Freq: Once | ORAL | Status: AC
  Administered 2021-07-18: 40 mg via ORAL
  Filled 2021-07-18: qty 2

## 2021-07-18 MED ORDER — MILRINONE LACTATE IN DEXTROSE 20-5 MG/100ML-% IV SOLN
0.3750 ug/kg/min | INTRAVENOUS | Status: DC
  Administered 2021-07-18 – 2021-07-19 (×3): 0.375 ug/kg/min via INTRAVENOUS
  Filled 2021-07-18 (×3): qty 100

## 2021-07-18 MED ORDER — PREDNISONE 20 MG PO TABS
30.0000 mg | ORAL_TABLET | Freq: Every day | ORAL | Status: AC
  Administered 2021-07-19: 30 mg via ORAL
  Filled 2021-07-18: qty 1

## 2021-07-18 MED ORDER — PREDNISONE 20 MG PO TABS
20.0000 mg | ORAL_TABLET | Freq: Every day | ORAL | Status: DC
  Administered 2021-07-20: 20 mg via ORAL
  Filled 2021-07-18 (×2): qty 1

## 2021-07-18 NOTE — Progress Notes (Signed)
Physical Therapy Treatment ?Patient Details ?Name: Karen West ?MRN: [id number] ?DOB: [birth year] ?Today's Date: 07/18/2021 ? ? ?History of Present Illness Pt is 81 yr old F admitted on 07/11/21 with c/o chest pain, admitted with A/C HFrEf. Found to have acute DVT L LE. PMH: breast CA, chest pain, anemia, CAD, HTN, hyperthyroid, MI, OSA, OA, R BBB, DMII, NSTEMI, CKD ? ?  ?PT Comments  ? ? Pt pleasant with family visiting throughout session. Pt with increased assist needed with transfers with min assist due to posterior bias with standing and increased fatigue with gait. Pt and family educated of need for family assist and use of BSC in the evening. Will continue to follow.  ? ?HR 77 ?95% on RA ?   ?Recommendations for follow up therapy are one component of a multi-disciplinary discharge planning process, led by the attending physician.  Recommendations may be updated based on patient status, additional functional criteria and insurance authorization. ? ?Follow Up Recommendations ? Home health PT ?  ?  ?Assistance Recommended at Discharge Intermittent Supervision/Assistance  ?Patient can return home with the following Assistance with cooking/housework;Assist for transportation;Help with stairs or ramp for entrance ?  ?Equipment Recommendations ? Rollator (4 wheels)  ?  ?Recommendations for Other Services   ? ? ?  ?Precautions / Restrictions Precautions ?Precautions: Fall;Other (comment) ?Precaution Comments: watch HR  ?  ? ?Mobility ? Bed Mobility ?  ?  ?  ?  ?  ?  ?  ?General bed mobility comments: pt sitting EOB on arrival ?  ? ?Transfers ?Overall transfer level: Needs assistance ?  ?Transfers: Sit to/from Stand ?Sit to Stand: Min assist ?  ?  ?  ?  ?  ?General transfer comment: initial stand from bed with posterior bias and assist to correct with return to chair pt sitting prematurely with fatigue and min assist to land on surface of chair.  Pt performed 3 repeated sit to stands with guarding and cues for hand  placement ?  ? ?Ambulation/Gait ?Ambulation/Gait assistance: Min assist ?Gait Distance (Feet): 90 Feet ?Assistive device: Rolling walker (2 wheels) ?Gait Pattern/deviations: Step-through pattern, Decreased stride length, Trunk flexed ?  ?Gait velocity interpretation: <1.8 ft/sec, indicate of risk for recurrent falls ?  ?General Gait Details: cues for posture and proximity to RW with pt able to self regulate distance and limited by fatigue with pt with standing rest half way leaning against wall and lack of control with descent to chair end of gait due to fatigue ? ? ?Stairs ?  ?  ?  ?  ?  ? ? ?Wheelchair Mobility ?  ? ?Modified Rankin (Stroke Patients Only) ?  ? ? ?  ?Balance Overall balance assessment: Needs assistance ?  ?Sitting balance-Leahy Scale: Fair ?  ?  ?Standing balance support: Bilateral upper extremity supported, Reliant on assistive device for balance ?Standing balance-Leahy Scale: Poor ?  ?  ?  ?  ?  ?  ?  ?  ?  ?  ?  ?  ?  ? ?  ?Cognition Arousal/Alertness: Awake/alert ?Behavior During Therapy: Flat affect ?Overall Cognitive Status: Impaired/Different from baseline ?Area of Impairment: Safety/judgement ?  ?  ?  ?  ?  ?  ?  ?  ?  ?  ?  ?  ?Safety/Judgement: Decreased awareness of deficits ?  ?  ?General Comments: pt with decreased recognition of fatigue and safety with transfers when fatigued ?  ?  ? ?  ?Exercises General Exercises - Lower Extremity ?  Long Arc Quad: AROM, Both, Seated, 20 reps ?Hip Flexion/Marching: AROM, Both, Seated, 10 reps ? ?  ?General Comments   ?  ?  ? ?Pertinent Vitals/Pain Pain Assessment ?Pain Assessment: No/denies pain  ? ? ?Home Living   ?  ?  ?  ?  ?  ?  ?  ?  ?  ?   ?  ?Prior Function    ?  ?  ?   ? ?PT Goals (current goals can now be found in the care plan section) Progress towards PT goals: Progressing toward goals ? ?  ?Frequency ? ? ? Min 3X/week ? ? ? ?  ?PT Plan Current plan remains appropriate  ? ? ?Co-evaluation   ?  ?  ?  ?  ? ?  ?AM-PAC PT "6 Clicks" Mobility    ?Outcome Measure ? Help needed turning from your back to your side while in a flat bed without using bedrails?: A Little ?Help needed moving from lying on your back to sitting on the side of a flat bed without using bedrails?: A Little ?Help needed moving to and from a bed to a chair (including a wheelchair)?: A Little ?Help needed standing up from a chair using your arms (e.g., wheelchair or bedside chair)?: A Little ?Help needed to walk in hospital room?: A Little ?Help needed climbing 3-5 steps with a railing? : A Lot ?6 Click Score: 17 ? ?  ?End of Session   ?Activity Tolerance: Patient tolerated treatment well ?Patient left: in chair;with call bell/phone within reach;with family/visitor present ?Nurse Communication: Mobility status ?PT Visit Diagnosis: Unsteadiness on feet (R26.81);Other abnormalities of gait and mobility (R26.89);Muscle weakness (generalized) (M62.81);Difficulty in walking, not elsewhere classified (R26.2) ?  ? ? ?Time: 8676-1950 ?PT Time Calculation (min) (ACUTE ONLY): 25 min ? ?Charges:  $Gait Training: 8-22 mins ?$Therapeutic Exercise: 8-22 mins          ?          ? ?Caprice Mccaffrey P, PT ?Acute Rehabilitation Services ?Pager: [phone] ?Office: [phone] ? ? ? ?Alter Moss B Darionna Banke ?07/18/2021, 12:26 PM ? ?

## 2021-07-18 NOTE — Progress Notes (Addendum)
?Cardiology Consultation:  ? ?Patient ID: Karen West ?MRN: [id number]; DOB: February 02, 1941 ? ?Admit date: 07/11/2021 ?Date of Consult: 07/18/2021 ? ?PCP:  Deland Pretty, MD ?  ?[facility] Providers ?Cardiologist:  Minus Breeding, MD      ? ? ?Patient Profile:  ? ?Karen West is a 81 y.o. female with a hx of severe mitral regurgitation, coronary artery disease status post PCI, ischemic cardiomyopathy with ejection fraction of 30 to 35%, chronic kidney disease, type 2 diabetes, hypertension, and hyperlipidemia who is being seen 07/18/2021 for the evaluation of mitral regurgitation at the request of Dr. Haroldine Laws. ? ?History of Present Illness:  ? ?Karen West is a 81 year old patient well-known to me.  I cared for her in February when she was admitted with acute on chronic systolic heart failure.  She underwent an evaluation at that time including a TEE which demonstrated severe mitral regurgitation with a heavily calcified posterior leaflet and mitral annular calcification.  She was not thought to be a candidate for mitral transcatheter edge-to-edge repair.  She was referred to [facility] in [place].  Unfortunately her creatinine at the time was elevated and at screening CT scan could not be obtained.  There were plans in place to repeat labs to see if this improved.  Unfortunately she was admitted with acute on chronic systolic heart failure on April 25.  She was noted to be increasingly dyspneic her weight was up a few pounds.  Her creatinine on admission was 2.34.  Was started on milrinone due to a initial call ox of 57%.  Milrinone wean was attempted however it was increased back to 0.25 on 29 April.  Yesterday the patient's "oximetry level was found to be 42% with a CVP of anywhere from 16-18 and a bicarbonate of 19.  Her serum creatinine was up to 2.7. ? ?Today the patient feels not badly.  She feels a little bit less short of breath.  She is able to lay flat.  Net negative about 3 L since admission. ? ?Past  Medical History:  ?Diagnosis Date  ? Atherosclerosis of aorta ([place])   ? a. 01/2017/03/2017 - noted on high res chest CTs.  ? Breast cancer ([place])   ? a. Bilateral --> s/p left mastectomy  ? Carotid artery disease ([place])   ? a. 06/979 w/ 19-14% LICA stenosis and <78% RICA stenosis; b. 10/2015 Carotid U/S: < 50% BICA stenosis  ? Chest pain   ? a. 09/2011 MV: EF 68%, no ischemia/infarct.  ? Chronic anemia   ? Chronic headaches   ? denies  ? Coronary artery calcification seen on CT scan   ? a. 01/2017 High res CT: atherosclerotic calcification of the arterial vascularture, including severe involvement of the coronary arteries; b. 03/2017 CT Chest: coronary and Ao atheroscelrosis.  ? GERD (gastroesophageal reflux disease)   ? History of echocardiogram   ? a. 09/2011 Echo: EF 55-60%, no rwma, triv AI, PASP 84mmHg.  ? Hyperlipidemia   ? Hypertension   ? Hyperthyroidism   ? Left upper lobe pulmonary nodule   ? a. 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 05/2017 s/p VATS.  ? Multinodular goiter   ? a. 02/2017 PET scan- Hypermetabolic nodule;  b. 03/3084 s/p thyroidectomy  ? Myocardial infarction [facility]) 06/2019  ? NONSTEMI  ? Obesity   ? OSA on CPAP   ? cpap  ? Osteoarthritis   ? Personal history of radiation therapy 1999  ?  Right bundle branch block   ? Type II or unspecified type diabetes mellitus without mention of complication, uncontrolled   ? ? ?Past Surgical History:  ?Procedure Laterality Date  ? BREAST BIOPSY  1993; 1995; 2000  ? left; right; left  ? BREAST EXCISIONAL BIOPSY Right pt unsure  ? CARDIAC CATHETERIZATION    ? CORONARY STENT INTERVENTION N/A 07/03/2019  ? Procedure: CORONARY STENT INTERVENTION;  Surgeon: Troy Sine, MD;  Location: [facility];  Service: Cardiovascular;  Laterality: N/A;  RCA  ? CORONARY/GRAFT ACUTE MI REVASCULARIZATION N/A 04/23/2021  ? Procedure: Coronary/Graft Acute MI Revascularization;  Surgeon:  Troy Sine, MD;  Location: [place] [facility];  Service: Cardiovascular;  Laterality: N/A;  ? ESOPHAGOGASTRODUODENOSCOPY (EGD) WITH PROPOFOL N/A 07/16/2020  ? Procedure: ESOPHAGOGASTRODUODENOSCOPY (EGD) WITH PROPOFOL;  Surgeon: Carol Ada, MD;  Location: [facility];  Service: Endoscopy;  Laterality: N/A;  ? FINE NEEDLE ASPIRATION N/A 07/16/2020  ? Procedure: FINE NEEDLE ASPIRATION (FNA) LINEAR;  Surgeon: Carol Ada, MD;  Location: [facility];  Service: Endoscopy;  Laterality: N/A;  ? LEFT HEART CATH AND CORONARY ANGIOGRAPHY N/A 07/03/2019  ? Procedure: LEFT HEART CATH AND CORONARY ANGIOGRAPHY;  Surgeon: Troy Sine, MD;  Location: [place] [facility];  Service: Cardiovascular;  Laterality: N/A;  ? LEFT HEART CATH AND CORONARY ANGIOGRAPHY N/A 04/23/2021  ? Procedure: LEFT HEART CATH AND CORONARY ANGIOGRAPHY;  Surgeon: Troy Sine, MD;  Location: [facility];  Service: Cardiovascular;  Laterality: N/A;  ? MASTECTOMY Left 2000  ? left  ? RIGHT HEART CATH N/A 05/13/2021  ? Procedure: RIGHT HEART CATH;  Surgeon: Jolaine Artist, MD;  Location: [facility] [facility];  Service: Cardiovascular;  Laterality: N/A;  ? TEE WITHOUT CARDIOVERSION N/A 05/02/2021  ? Procedure: TRANSESOPHAGEAL ECHOCARDIOGRAM (TEE);  Surgeon: Skeet Latch, MD;  Location: [facility];  Service: Cardiovascular;  Laterality: N/A;  ? TEE WITHOUT CARDIOVERSION N/A 05/16/2021  ? Procedure: TRANSESOPHAGEAL ECHOCARDIOGRAM (TEE);  Surgeon: Werner Lean, MD;  Location: [facility];  Service: Cardiovascular;  Laterality: N/A;  ? THYROIDECTOMY  05/10/2017  ? VIDEO BRONCHOSCOPY WITH ENDOBRONCHIAL NAVIGATION (N/A)  ? THYROIDECTOMY N/A 05/10/2017  ? Procedure: TOTAL THYROIDECTOMY;  Surgeon: Armandina Gemma, MD;  Location: [facility];  Service: General;  Laterality: N/A;  ? UPPER ESOPHAGEAL ENDOSCOPIC ULTRASOUND (EUS) N/A 07/16/2020  ? Procedure: UPPER ESOPHAGEAL ENDOSCOPIC ULTRASOUND (EUS);  Surgeon: Carol Ada, MD;  Location: [facility];  Service: Endoscopy;  Laterality: N/A;  ? VIDEO ASSISTED THORACOSCOPY (VATS)/ LOBECTOMY Left 06/04/2017  ? Procedure: VIDEO ASSISTED THORACOSCOPY (VATS)/LEFT UPPER LOBECTOMY;  Surgeon: [name], MD;  Location: [facility];  Service: Thoracic;  Laterality: Left;  ? VIDEO BRONCHOSCOPY WITH ENDOBRONCHIAL NAVIGATION N/A 05/10/2017  ? Procedure: VIDEO BRONCHOSCOPY WITH ENDOBRONCHIAL NAVIGATION;  Surgeon: [name], MD;  Location: [facility];  Service: Thoracic;  Laterality: N/A;  ? VIDEO BRONCHOSCOPY WITH ENDOBRONCHIAL ULTRASOUND N/A 06/04/2017  ? Procedure: VIDEO BRONCHOSCOPY WITH ENDOBRONCHIAL ULTRASOUND;  Surgeon: [name], MD;  Location: [facility];  Service: Thoracic;  Laterality: N/A;  ?  ? ? ? ?Inpatient Medications: ?Scheduled Meds: ? aspirin EC  81 mg Oral Daily  ? Chlorhexidine Gluconate Cloth  6 each Topical Daily  ? dextromethorphan  60 mg Oral BID  ? furosemide  80 mg Intravenous BID  ? insulin aspart  0-5 Units Subcutaneous QHS  ? insulin aspart  0-9 Units Subcutaneous TID WC  ? insulin glargine-yfgn  10 Units Subcutaneous Daily  ? levothyroxine  88 mcg Oral QAC breakfast  ? metoprolol succinate  12.5 mg Oral QHS  ? pantoprazole  40 mg Oral Daily  ? senna-docusate  2 tablet Oral QHS  ? sodium chloride flush  10-40 mL Intracatheter Q12H  ? ticagrelor  90 mg Oral BID  ? ?Continuous Infusions: ? amiodarone 30 mg/hr (07/18/21 0300)  ? heparin 900 Units/hr (07/17/21 2334)  ? milrinone 0.25 mcg/kg/min (07/17/21 1407)  ? ?PRN Meds: ?benzonatate, HYDROmorphone (DILAUDID) injection, LORazepam, melatonin, nitroGLYCERIN, oxyCODONE, polyethylene glycol, prochlorperazine, sodium chloride flush ? ?Allergies:    ?Allergies  ?Allergen Reactions  ? Tramadol Nausea And Vomiting and Other (See Comments)  ?  "got sick to my stomach and was throwing up blood; it put me in the hospital")  ? Lisinopril Rash and Other (See Comments)  ?  Renal failure ?  ? Rosuvastatin Other (See Comments)  ?  Other  reaction(s): rhabdo  ? Tapazole [Methimazole] Itching and Rash  ? ? ?Social History:   ?Social History  ? ?Socioeconomic History  ? Marital status: Widowed  ?  Spouse name: Not on file  ? Number of children

## 2021-07-18 NOTE — Progress Notes (Signed)
ANTICOAGULATION CONSULT NOTE - Follow Up Consult ? ?Pharmacy Consult for Heparin ?Indication: LLE DVT / Afib ? ?Allergies  ?Allergen Reactions  ? Tramadol Nausea And Vomiting and Other (See Comments)  ?  "got sick to my stomach and was throwing up blood; it put me in the hospital")  ? Lisinopril Rash and Other (See Comments)  ?  Renal failure ?  ? Rosuvastatin Other (See Comments)  ?  Other reaction(s): rhabdo  ? Tapazole [Methimazole] Itching and Rash  ? ? ?Patient Measurements: ?Height: 5\' 1"  (154.9 cm) ?Weight: 64.5 kg (142 lb 3.2 oz) ?IBW/kg (Calculated) : 47.8 ?Heparin Dosing Weight: 62 kg ? ?Vital Signs: ?Temp: 97.7 ?F (36.5 ?C) (05/01 0419) ?Temp Source: Oral (05/01 0419) ?BP: 107/67 (05/01 0419) ?Pulse Rate: 77 (05/01 0419) ? ?Labs: ?Recent Labs  ?  07/16/21 ?0263 07/17/21 ?0446 07/17/21 ?7858 07/17/21 ?0800 07/17/21 ?1154 07/18/21 ?0502  ?HGB 9.7* 10.0*  --   --   --  10.0*  ?HCT 28.6* 29.4*  --   --   --  29.8*  ?PLT 190 196  --   --   --  207  ?HEPARINUNFRC  --   --    < > >1.10* 0.45 0.61  ?CREATININE 2.49* 2.68*  --   --   --  2.62*  ? < > = values in this interval not displayed.  ? ? ? ?Estimated Creatinine Clearance: 15.2 mL/min (A) (by C-G formula based on SCr of 2.62 mg/dL (H)). ? ?Assessment: ?81 yo female with LLE DVT per duplex and started IV heparin on 07/12/21. No anticoagulants noted PTA but noted on ticagrelor and DES placed 04/23/21.  Had received Enoxaparin 30 mg x 1 on 4/25 at ~9am. ?Heparin drip 900 uts/hr running through PICC line with restricted arm access, monitor for accuracy of heparin levels  ? ?Heparin level 0.61 this am at goal  ?Cbc stable no bleeding noted  ?Plan for oral anticoagulation (Xarelto) when procedures are completed ? ?Goal of Therapy:  ?Heparin level 0.3-0.7 units/ml ?Monitor platelets by anticoagulation protocol: Yes ?  ?Plan:  ?Continue heparin drip at 900 units/hr ?Next heparin level and CBC in am. ? ?[place] Crane, Pharm D, BCPS, BCCP ?Clinical Pharmacist ?  07/18/2021 2:39 PM  ? ?[facility] pharmacy phone numbers are listed on [email or web address] ? ? ? ? ? ? ?

## 2021-07-18 NOTE — Telephone Encounter (Signed)
Review of chart it appears Dr. Ali Lowe reached out to Dr. Bridgette Habermann in at [facility] this morning.  Messaged Dr. Ali Lowe to let him know Dr. Evie Lacks office called back.  I will forward to him for follow up. ?

## 2021-07-18 NOTE — Telephone Encounter (Signed)
Dr. Resa Miner office called and wanted to talk with Dr. Ali Lowe about patient who is hospitalized in hospital ?

## 2021-07-18 NOTE — TOC CM/SW Note (Signed)
HF TOC CM received referral to follow up on request to have son visit pt. HF TOC CM spoke to Ms [facility] with the [place] [employer] # [phone]. States family will have to pay a fee of $50 to officer when he arrives. Has to be a money order made out to [facility]. States both [place] and Rosalyn Gess are not available today but states she will send an urgent message to have processed. Will give CM a call back with update. States both Rosalyn Gess and Bonney Roussel will be in office on tomorrow. States they have received letter from attending late Friday.  ? [place], [place] # [id number] ? ?Jonnie Finner RN3 CCM, Heart Failure TOC CM [phone]  ? ?

## 2021-07-18 NOTE — Progress Notes (Addendum)
? ? Advanced Heart Failure Rounding Note ? ?PCP-Cardiologist: Minus Breeding, MD  ?AHF: Dr. Haroldine Laws  ? ?Subjective:   ? ?4/25: Admitted w/ a/c CHF w/ low output. PICC placed. Initial Co-ox 57%. Started on milrinone 0.25 + IV Lasix.   ?4/25 LE Venous Dopplers + for left DVT. On heparin gtt.  ?4/26 A fib RVR. Loaded on amio. Milrinone stopped. CO-OX 43%. Milrinone 0.125 mcg restarted  ?4/29 Milrinone increased to 0.25  ? ?Yesterday diuresed with 80 mg IV lasix x2. Weight down 4 pounds.  ? ?Currently on amio drip @ 30/hr and milrinone 0.25 mcg. CO-OX 48%  ? ?Complaining of left foot pain. Having a hard time sleeping.  ? ? ?Objective:   ?Weight Range: ?64.5 kg ?Body mass index is 26.87 kg/m?.  ? ?Vital Signs:   ?Temp:  [97.7 ?F (36.5 ?C)-98.7 ?F (37.1 ?C)] 97.7 ?F (36.5 ?C) (05/01 0419) ?Pulse Rate:  [70-85] 77 (05/01 0419) ?Resp:  [20] 20 (04/30 1241) ?BP: (107-156)/(67-115) 107/67 (05/01 0419) ?SpO2:  [95 %-97 %] 97 % (05/01 0419) ?Weight:  [64.5 kg] 64.5 kg (05/01 0419) ?Last BM Date : 07/17/21 ? ?Weight change: ?Filed Weights  ? 07/16/21 0530 07/17/21 0425 07/18/21 0419  ?Weight: 66.9 kg 66.3 kg 64.5 kg  ? ? ?Intake/Output:  ? ?Intake/Output Summary (Last 24 hours) at 07/18/2021 0755 ?Last data filed at 07/18/2021 0419 ?Gross per 24 hour  ?Intake 1292.92 ml  ?Output 1750 ml  ?Net -457.08 ml  ? ?  ? ?CVP 13 personally checked.  ?Physical Exam  ?General:  Appears weak. No resp difficulty ?HEENT: normal ?Neck: supple. JVP to jaw . Carotids 2+ bilat; no bruits. No lymphadenopathy or thryomegaly appreciated. ?Cor: PMI nondisplaced. Regular rate & rhythm. No rubs, gallops. 3/6 MR . ?Lungs: clear ?Abdomen: soft, nontender, nondistended. No hepatosplenomegaly. No bruits or masses. Good bowel sounds. ?Extremities: no cyanosis, clubbing, rash, edema. L foot tender. Ted hose on lower extremities. RUE PICC  ?Neuro: alert & orientedx3, cranial nerves grossly intact. moves all 4 extremities w/o difficulty. Affect flat   ? ? ?Telemetry  ? ?SR 70-80s PACs/PVCS 6-10 per hour ? ? ?Labs  ?  ?CBC ?Recent Labs  ?  07/17/21 ?0446 07/18/21 ?0502  ?WBC 6.3 6.7  ?HGB 10.0* 10.0*  ?HCT 29.4* 29.8*  ?MCV 86.7 85.9  ?PLT 196 207  ? ?Basic Metabolic Panel ?Recent Labs  ?  07/16/21 ?0272 07/17/21 ?0446  ?NA 127* 127*  ?K 3.1* 4.7  ?CL 92* 94*  ?CO2 19* 19*  ?GLUCOSE 193* 194*  ?BUN 76* 74*  ?CREATININE 2.49* 2.68*  ?CALCIUM 7.9* 8.1*  ? ?Liver Function Tests ?No results for input(s): AST, ALT, ALKPHOS, BILITOT, PROT, ALBUMIN in the last 72 hours. ? ?No results for input(s): LIPASE, AMYLASE in the last 72 hours. ? ?Cardiac Enzymes ?No results for input(s): CKTOTAL, CKMB, CKMBINDEX, TROPONINI in the last 72 hours. ? ?BNP: ?BNP (last 3 results) ?Recent Labs  ?  04/27/21 ?5366 06/17/21 ?0957 06/29/21 ?1818  ?BNP 1,077.8* 2,771.3* 3,309.2*  ? ? ?ProBNP (last 3 results) ?No results for input(s): PROBNP in the last 8760 hours. ? ? ?D-Dimer ?No results for input(s): DDIMER in the last 72 hours. ?Hemoglobin A1C ?No results for input(s): HGBA1C in the last 72 hours. ?Fasting Lipid Panel ?No results for input(s): CHOL, HDL, LDLCALC, TRIG, CHOLHDL, LDLDIRECT in the last 72 hours. ?Thyroid Function Tests ?No results for input(s): TSH, T4TOTAL, T3FREE, THYROIDAB in the last 72 hours. ? ?Invalid input(s): FREET3 ? ?Other results: ? ? ?  Imaging  ? ? ?No results found. ? ? ?Medications:   ? ? ?Scheduled Medications: ? aspirin EC  81 mg Oral Daily  ? Chlorhexidine Gluconate Cloth  6 each Topical Daily  ? dextromethorphan  60 mg Oral BID  ? furosemide  80 mg Intravenous BID  ? insulin aspart  0-5 Units Subcutaneous QHS  ? insulin aspart  0-9 Units Subcutaneous TID WC  ? insulin glargine-yfgn  10 Units Subcutaneous Daily  ? levothyroxine  88 mcg Oral QAC breakfast  ? metoprolol succinate  12.5 mg Oral QHS  ? pantoprazole  40 mg Oral Daily  ? senna-docusate  2 tablet Oral QHS  ? sodium chloride flush  10-40 mL Intracatheter Q12H  ? ticagrelor  90 mg Oral BID   ? ? ?Infusions: ? amiodarone 30 mg/hr (07/18/21 0300)  ? heparin 900 Units/hr (07/17/21 2334)  ? milrinone 0.25 mcg/kg/min (07/17/21 1407)  ? ? ?PRN Medications: ?benzonatate, HYDROmorphone (DILAUDID) injection, LORazepam, melatonin, nitroGLYCERIN, oxyCODONE, polyethylene glycol, prochlorperazine, sodium chloride flush ? ? ? ?Patient Profile  ? ?81 y.o. female with CAD s/p recent inferolateral STEMI 2/23 w/ PCI to the RCA, severe (suspected infarct related) mitral regurgitation not candidate for TEER and being considered for Percutaneous MV replacement at [facility], chronic systolic heart failure w/ EF 35-40%, Stage IV CKD, HTN, anemia, DM and sleep apnea.  ? ? ?Admitted with increased dyspnea/abdominal pain in the setting of A/C HFrEF and suspected low output and AKI on CKD. Also found to have Lt LE DVT.  ? ?Assessment/Plan  ? ?1. A/C HFrEF, ICM  ?-Echo (2/23): EF 30-35% and severe MR ?-TEE (2/23): 35-40% severe MR ?-RHC (2/23): showed normal cardiac output  ?-Presented increased dyspnea and abd pain and low output, Co-ox 57% ?-needs surgical management of severe MR however not current surgical candidate w/ co morbidities.  ?- Echo 4/23  EF 35-40% RV normal. Severe MR ?-Now on milrinone 0.25 mcg, CO-OX 48%. Increase milrinone 0.375 mcg.  ?- CVP 13. Continue IV lasix.  ?- Stop Toprol XL persistent low output.  Remains on amio drip.  ?-Dr Ali Lowe consulted and he is reaching out to Dr Bridgette Habermann today.  If they would consider her for tMVR if Scr improves then could consider placing IABP and moving to ICU to see if we could temporize her. Otherwise plan will be to consider transition to comfort care as her breathing worsens  ?  ?2. Transaminitis  ?- Abd [place] neg for cholecystitis.  ?- Due to hepatic congestion/low output hf.  ?- LFTs downtrending  ?  ?3. Severe MR  ?- TEE 2/23 reviewed EF 35-40% severe MR. ?- Reviewed with Structural Team. Not candidate for MV Clip due to perivalvular calcium and short posterior  leaflet. Dr Ali Lowe consulted to help exhaust all options. He is reaching out to Dr Bridgette Habermann today.  ? ?4. CKD Stage IV ?-Recent creatinine baseline 2.2-2.4 ?-SCr 2.08>>2.33 >>2.48>>2.59 > 2.49 > 2.68> pending.  ?-Suspect cardiorenal in setting of low output HF  ?-continue milrinone  ?  ?5. Anemia of chronic illness ?-Hgb 10, stable ?-monitor w/ heparin gtt, asa and Brilinta  ?  ?6. DMII ?-Hgb A1C 6 04/2021  ?-On sliding scale.  ?  ?7. CAD  ? Late presentation STEMI that compounded her chronic moderate mitral regurgitation.  ?-Had recent DES to LCx. On asa and brillinta. Resume statin ?-HS Trop 81>71>87 ?-No chest pain.  ?-she now has LE DVT and will need a/c. PCI > 30 days ago. Can stop ASA once  transitioned to [place]. Will need to change Brilitna to Plavix to reduce bleed risk given chronic anemia. But doubt she will be able to get out of hospital with her severe HF ? - Continue PPI    ?  ?8. PVCs  ?-continue amio drip.  ? ?9. LLE DVT ?- new this admit, on heparin gtt. PE has not been ruled out. No chest CT w/ CKD. ?-repeat limited echo w/ normal RV  ? ?10.  Paroxysmal A Fib RVR ?-4/26 Developed  A fib RVR. Loading amio drip.  ?- Converted to SR.   ?- Continue amio while on milrinone.  ?-Continue heparin drip.  ? ?11. L foot pain ?? Gout flare. Check uric acid.  ? ?12. GOC ?Palliative Care following. DNR/DNI ? ?Check BMET now. Check uric acid.Repeat CO-OX at 1200.  ? ?Length of Stay: 6 ? ?Darrick Grinder, NP  ?07/18/2021, 7:55 AM ? ?Advanced Heart Failure Team ?Pager [phone] (M-F; 7a - 5p)  ?Please contact [facility] for night-coverage after hours (5p -7a ) and weekends on [email or web address] ? ?Patient seen and examined with the above-signed Advanced Practice Provider and/or Housestaff. I personally reviewed laboratory data, imaging studies and relevant notes. I independently examined the patient and formulated the important aspects of the plan. I have edited the note to reflect any of my changes or salient points. I have  personally discussed the plan with the patient and/or family. ? ?Coox remains low on higher dose of milrinone. Diuresis has improved though. Scr remains elevated. Says she has come to terms with her prognosis ? ?General:  Weak ap

## 2021-07-18 NOTE — Progress Notes (Signed)
?PROGRESS NOTE ? ? ? ?Karen West  HAL:[id number] DOB: 1940/12/29 DOA: 07/11/2021 ?PCP: Deland Pretty, MD  ? ? 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  In the emergency room afebrile.  Blood pressure stable.  Respiration 30.  93% on room air.  Potassium 3, creatinine 2.34, transaminases elevated. ? ? ?Assessment & Plan: ?  ?Acute on chronic systolic congestive heart failure. ?Ischemic cardiomyopathy with known ejection fraction 25 to 30% ?Severe mitral regurgitation, not a surgical candidate.  Fluid overload secondary to valvular heart disease ?Coronary artery disease status post coronary stents on dual antiplatelet therapy. ?Poor functional status, advanced kidney disease. ? ?Patient with severe cardiovascular disease.  Currently managed by heart failure team with ?Milrinone infusion 0.375 mcg/kg/min, Lasix 80 mg IV twice daily.   ?On metoprolol. ?Continue strict intake and output monitoring.  Advanced heart failure therapy following. ?Patient is on aspirin and Brilinta after recent coronary stent, started on heparin for acute DVT.  Severe mitral regurgitation possibly complicating fluid retention.  Followed by structural heart team. ?Patient with advanced cardiovascular disease with high mortality.  ?Seen by palliative care team.  Agreed for DNR/DNI.  Continue full scope of treatment.  ?She is probably a candidate for home hospice, structural heart team and CHF team weighing in. ? ?Acute left lower extremity DVT: on  heparin.  Will need DOAC.  As above, hopefully she can be off aspirin to avoid bleeding risk.   ? ?Acute on chronic kidney disease stage IV: Creatinine about 2.5-2.6.  2.62 today. ? ?Hypokalemia: Replaced and adequate. ? ?Congestive hepatopathy: LFTs stable. ? ?Type 2 diabetes, well controlled.  On insulin at home.  Remains on insulin and is stable. ? ?New onset A-fib: Currently converted to sinus rhythm.  Remains on amiodarone.  Therapeutic on heparin. ? ?Left ankle pain: Acute gouty arthritis.  Uric acid 20.  Colchicine is contraindicated with her renal functions.  Prednisone 40 mg now, if responds well we will treat with tapering dose of prednisone. ? ?Goal of care: ?See palliative care discussion.  Patient agreeable to DNR/DNI with full scope of treatment at this time.  Not quite ready to accept hospice level of care.  She will best benefit with hospice at home once stabilized.  Ongoing discussions. ?Prescribe some Ativan to her, she did not feel well after that.  Will discontinue. ? ?DVT prophylaxis: Place TED hose Start: 07/14/21 1246  Heparin infusion. ? ? ?Code Status: Full code. ?Family Communication: Sister at the bedside. ?Disposition Plan: Status is: Inpatient ?Remains inpatient appropriate because: Severe cardiovascular disease.  On vasoactive infusions. ?  ? ? ?Consultants:  ?Cardiology ?Palliative care ? ?Procedures:  ?None ? ?Antimicrobials:  ?None ? ? ?Subjective: ? ?Patient seen and examined.  Slept fairly well last night.  Denies any shortness of breath or chest pain at rest.  Sister at the bedside.  Cough is better now. ?New onset of left ankle pain that is bothering her. ? ?Objective: ?Vitals:  ? 07/17/21 0843 07/17/21 1241 07/17/21 1952 07/18/21 0419  ?BP: (!) 156/102 (!) 140/115 (!) 150/94 107/67  ?Pulse: 70  85 77  ?Resp:  20 20    ?Temp: 97.7 ?F (36.5 ?C) 98.7 ?F (37.1 ?C) 98.4 ?F (36.9 ?C) 97.7 ?F (36.5 ?C)  ?TempSrc: Oral Oral Oral Oral  ?SpO2: 95% 97% 97% 97%  ?Weight:    64.5 kg  ?Height:       ? ? ?Intake/Output Summary (Last 24 hours) at 07/18/2021 1056 ?Last data filed at 07/18/2021 0419 ?Gross per 24 hour  ?Intake 1292.92 ml  ?Output 1750 ml  ?Net -457.08 ml  ? ? ?Filed Weights  ? 07/16/21 0530 07/17/21 0425 07/18/21 0419  ?Weight: 66.9 kg 66.3 kg 64.5 kg  ? ? ?Examination: ? ?General exam: Frail and debilitated.  Chronically sick looking.  Not in any distress.  On room air. ?Respiratory system: Poor air entry at bases. ?Cardiovascular system: S1 & S2 heard, RRR.  Engorged jugular vein.  Pansystolic murmur at the mitral area. ?1+ bilateral pedal edema.  More on the dorsum of the foot.   ?Left ankle with painful range of motion, no obvious swelling or erythema. ?Gastrointestinal system: Abdomen is nondistended, soft and nontender. No organomegaly or masses felt. Normal bowel sounds heard. ?Central nervous system: Alert and oriented. No focal neurological deficits. ?Extremities: Symmetric 5 x 5 power.  Generalized weakness. ? ? ? ? ?Data Reviewed: I have personally reviewed following labs and imaging studies ? ?CBC: ?Recent Labs  ?Lab 07/11/21 ?1520 07/12/21 ?0431 07/13/21 ?0086 07/14/21 ?0350 07/15/21 ?7619 07/16/21 ?5093 07/17/21 ?0446 07/18/21 ?0502  ?WBC 11.0* 11.5* 9.3 9.3 7.6 6.6 6.3 6.7  ?NEUTROABS 10.2* 9.8* 7.6  --   --   --   --   --   ?HGB 11.2* 10.3* 9.4* 9.3* 10.1* 9.7* 10.0* 10.0*  ?HCT 36.8 32.6* 29.9* 29.2* 30.5* 28.6* 29.4* 29.8*  ?MCV 95.8 92.9 91.7 91.5 89.4 86.4 86.7 85.9  ?PLT 202 178 187 194 207 190 196 207  ? ?Basic Metabolic Panel: ?Recent Labs  ?Lab 07/12/21 ?0050 07/12/21 ?0431 07/12/21 ?1059 07/13/21 ?0507 07/14/21 ?0350 07/15/21 ?2671 07/16/21 ?2458 07/17/21 ?0446 07/18/21 ?0502  ?NA  --  138   < > 137 130* 132* 127* 127* 129*  ?K  --  2.8*   < > 4.0 4.7 3.8 3.1* 4.7 4.0  ?CL  --  107   < > 106 100 99 92* 94* 94*  ?CO2  --  19*   < > 19* 17* 20* 19* 19* 21*  ?GLUCOSE  --  108*   < > 188* 231* 160* 193* 194* 132*  ?BUN  --  73*   < > 77* 79* 78* 76* 74* 74*  ?CREATININE  --   2.07*   < > 2.33* 2.48* 2.59* 2.49* 2.68* 2.62*  ?CALCIUM  --  8.1*   < > 7.9* 7.6* 7.9* 7.9* 8.1* 8.3*  ?MG 2.7* 1.9  --  2.2  --   --   --   --   --   ?PHOS  --  5.2*  --   --   --   --   --   --   --   ? < > = values in this interval not displayed.  ? ?GFR: ?Estimated Creatinine Clearance: 15.2 mL/min (A) (by C-G formula based on SCr of 2.62 mg/dL (H)). ?Liver Function Tests: ?Recent Labs  ?Lab 07/11/21 ?1520 07/12/21 ?0431 07/13/21 ?0507  ?AST 138* 92* 80*  ?ALT 519* 413* 319*  ?ALKPHOS 138* 121 140*  ?BILITOT 1.5* 1.5* 1.6*  ?PROT 7.1 6.7 6.7  ?ALBUMIN 2.9* 2.6* 2.5*  ? ?  Recent Labs  ?Lab 07/11/21 ?2035 07/12/21 ?0431  ?LIPASE 68* 59*  ? ?No results for input(s): AMMONIA in the last 168 hours. ?Coagulation Profile: ?No results for input(s): INR, PROTIME in the last 168 hours. ?Cardiac Enzymes: ?No results for input(s): CKTOTAL, CKMB, CKMBINDEX, TROPONINI in the last 168 hours. ?BNP (last 3 results) ?No results for input(s): PROBNP in the last 8760 hours. ?HbA1C: ?No results for input(s): HGBA1C in the last 72 hours. ?CBG: ?Recent Labs  ?Lab 07/17/21 ?7412 07/17/21 ?1206 07/17/21 ?1649 07/17/21 ?2112 07/18/21 ?8786  ?GLUCAP 181* 161* 186* 170* 140*  ? ?Lipid Profile: ?No results for input(s): CHOL, HDL, LDLCALC, TRIG, CHOLHDL, LDLDIRECT in the last 72 hours. ?Thyroid Function Tests: ?No results for input(s): TSH, T4TOTAL, FREET4, T3FREE, THYROIDAB in the last 72 hours. ?Anemia Panel: ?No results for input(s): VITAMINB12, FOLATE, FERRITIN, TIBC, IRON, RETICCTPCT in the last 72 hours. ?Sepsis Labs: ?No results for input(s): PROCALCITON, LATICACIDVEN in the last 168 hours. ? ?No results found for this or any previous visit (from the past 240 hour(s)).  ? ? ? ? ? ?Radiology Studies: ?No results found. ? ? ? ? ? ?Scheduled Meds: ? aspirin EC  81 mg Oral Daily  ? Chlorhexidine Gluconate Cloth  6 each Topical Daily  ? dextromethorphan  60 mg Oral BID  ? furosemide  80 mg Intravenous BID  ? insulin aspart  0-5 Units  Subcutaneous QHS  ? insulin aspart  0-9 Units Subcutaneous TID WC  ? insulin glargine-yfgn  10 Units Subcutaneous Daily  ? levothyroxine  88 mcg Oral QAC breakfast  ? pantoprazole  40 mg Oral Daily  ? senna-d

## 2021-07-18 NOTE — Progress Notes (Signed)
?Daily Progress Note  ? ?Patient Name: Karen West       Date: 07/18/2021 ?DOB: 06-Mar-1941  Age: 81 y.o. MRN#: [id number] ?Attending Physician: Barb Merino, MD ?Primary Care Physician: Deland Pretty, MD ?Admit Date: 07/11/2021 ?Length of Stay: 6 days ? ?Reason for Consultation/Follow-up: Establishing goals of care ? ?HPI/Patient Profile:  81 y.o. 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  Found to have EF 25 to 30%.  Found to have severe mitral regurgitation and not a surgical candidate.  Remains on milrinone infusion.  Patient also with acute on chronic kidney disease, creatinine worsening.  PMT consulted to discuss goals of care. ? ?Subjective:  ? ?Subjective: ?Chart Reviewed. Updates received. Patient Assessed. Created space and opportunity for patient  and family to explore thoughts and feelings regarding current medical situation. ? ?Today's Discussion: I met with the bedside today with the patient Karen West, her daughter Karen West, and 2 other family members including her sister.  I was joined by Dr. Christiana Fuchs (internal medicine resident).  I explained the role of palliative care and reinforced previous visit by my colleague Royden Purl, NP a few days ago.  The patient does remember seeing my colleague.  I confirmed the decision to be a DNR.  We also discussed that previously the plan was to try to decrease the milrinone and see how she does.  Unfortunately, she was not doing well with this so milrinone has subsequently been increased a couple times.  Her Choloxin is low, CVP high.  There are minimal options remaining, and the patient understands this. ? ?Cardiology is  reaching out to a physician down in [place] to see if the patient is a candidate for a trial tMVR as I do not feel she would tolerate open heart surgery.  If so, we explained this would necessitate moved to the ICU and placement of IABP for stabilization.  The patient states she is willing to do this if it is necessary.  We discussed that the flip side of this may be that they decided she is not a candidate at which point there is likely not many treatment options left.  She also states that she understands this.  She states a feeling of peace with what ever may happen.  She is available and open to any treatment options that are presented to her.  We discussed that if there are not many treatment options left we could have a discussion on hospice which would focus on comfort, dignity, and quality if it is indeed time for her to face the end of life. ? ?Overall today she feels okay, breathing is somewhat better today.  She does get shortness of breath with exertion and sometimes at rest.  Her overall goals, as previously discussed, hard to be at home and to try to prevent recurrent, frequent rehospitalization's. ? ?I allowed ample time for the patient and family to ask questions and answered those to the best my ability.  I also offered spiritual care services with our chaplain for patient directed visit including prayer, or simple listening.  She accepted. ? ?I provided emotional and general support through therapeutic listening, empathy, sharing stories, and other techniques.  Answered all questions and addressed all concerns  to the best my ability. ? ?Review of Systems  ?Constitutional:   ?     Overall feeling better  ?Respiratory:  Positive for shortness of breath (improved).   ?Cardiovascular:  Negative for chest pain.  ?Gastrointestinal:  Negative for nausea and vomiting.  ? ?Objective:  ? ?Vital Signs:  ?BP 107/67 (BP Location: Left Leg)   Pulse 77   Temp 97.7 ?F (36.5 ?C) (Oral)   Resp 20   Ht $R'5\' 1"'wJ$   (1.549 m)   Wt 64.5 kg   SpO2 97%   BMI 26.87 kg/m?  ? ?Physical Exam: ?Physical Exam ?Vitals and nursing note reviewed.  ?HENT:  ?   Head: Normocephalic and atraumatic.  ?Cardiovascular:  ?   Rate and Rhythm: Normal rate.  ?Pulmonary:  ?   Effort: Pulmonary effort is normal. No respiratory distress.  ?Neurological:  ?   General: No focal deficit present.  ?   Mental Status: She is alert.  ?Psychiatric:     ?   Mood and Affect: Mood normal.     ?   Behavior: Behavior normal.  ? ? ?Palliative Assessment/Data: 40% ? ? ?Assessment & Plan:  ? ?Impression: ?Present on Admission: ? Chest pain ? Type 2 diabetes mellitus with complication, without long-term current use of insulin ([place]) ? Hyperlipidemia ? OBSTRUCTIVE SLEEP APNEA ? Essential hypertension ? HLD (hyperlipidemia) ? Acute on chronic systolic CHF (congestive heart failure) ([place]) ? ?81 year old female with shortness of breath, history of CAD status post PCI to RCA, CKD stage IV, type 2 diabetes, HFrEF 35 to 40%, severe mitral regurgitation, ischemic cardiomyopathy.  There is a question of whether she would be a candidate for tMVR and this is being discussed with the physicians down in [place].  If she is a candidate they could consider IABP stabilization and transfer for procedure.  She is not a candidate for neuro likely not many treatment options left.  She seems to be at peace with this.  She is willing and accepting of any treatment options that are available to her, but she seems at peace that if this is her time then that is okay.  Briefly mentioned hospice as a possible option if no treatment options left which would allow [place] to focus on comfort, quality, dignity.  Overall prognosis highly dependent on whether she is a candidate for minimally invasive mitral valve replacement. ? ?SUMMARY OF RECOMMENDATIONS   ?Remain DNR ?Continue to treat the treatable ?Await decision on options for tMVR ?Further goals of care discussions pending medical treatment  options that are made available to her ?PMT will continue to follow ? ?Symptom Management:  ?Per primary team ?PMT is available to assist as needed ? ?Code Status: DNR ? ?Prognosis: Unable to determine ? ?Discharge Planning: To Be Determined ? ?Discussed with: Medical team, nursing team, patient, patient's family ? ?Thank you for allowing [place] to participate in the care of Karen West ?PMT will continue to support holistically. ? ?Billing based on MDM: High ? ?Problems Addressed: One acute or chronic illness or injury that poses a threat to life or bodily function ? ?Amount and/or Complexity of Data: Category 1:Review of prior external note(s) from each unique source and Review of the result(s) of each unique test and Category 3:Discussion of management or test interpretation with external physician/other qualified health care professional/appropriate source (not separately reported) (Reviewed most recent cardiology OP note; reviewed CBC,. CMP (Cr specifically), CoOx labs; all to aid in prognostication ? ?Risks: Decision not to resuscitate  or to de-escalate care because of poor prognosis ? ? ? ?Walden Field, NP ?Palliative Medicine Team ? ?Team Phone # [phone] (Nights/Weekends) ? 11/16/2020, 8:17 AM  ? ?

## 2021-07-18 NOTE — Progress Notes (Signed)
This chaplain responded to PMT consult for spiritual care and support. The chaplain understands the Pt. is peacefully waiting on medical care decisions. The visit starts with the Pt. sister-Barbara at the bedside and the Pt. daughters and sisters join at the end of the visit. The family is celebrating the Pt. sitting up in the bedside recliner and eating about half of her lunch.  The Pt. closes her eyes intermittently during the visit and responds with a frequent smile. ? ?The chaplain listens reflectively to Pamala Hurry as she participates in storytelling. The chaplain understands the Pt. role as an encourager and caregiver make the reversal of roles very easy for the family. The Pt.'s deacon prayed with the Pt. today, but the chaplain understands "one can never have too many prayers."  The chaplain joined the family in prayer at the end of the visit.  ? ?This chaplain is available for F/U spiritual care as needed. ? ?Chaplain Sallyanne Kuster ?[phone] ?

## 2021-07-18 NOTE — Progress Notes (Signed)
Occupational Therapy Treatment ?Patient Details ?Name: Karen West ?MRN: [id number] ?DOB: Nov 28, 1940 ?Today's Date: 07/18/2021 ? ? ?History of present illness Pt is 81 yr old F admitted on 07/11/21 with c/o chest pain, admitted with A/C HFrEf. Found to have acute DVT L LE. PMH: breast CA, chest pain, anemia, CAD, HTN, hyperthyroid, MI, OSA, OA, R BBB, DMII, NSTEMI, CKD ?  ?OT comments ? Pt appearing fatigued seated up in chair, visiting with her family, but declined returning to bed. Pt's family member stating her current shower seat is not safe. Family aware that insurance does not pay for shower equipment. Educated pt and family in multiple uses of 3 in 1 she already owns. Pt weaker and less steady vs previous session. Family aware pt will need 24 hour care at home.   ? ?Recommendations for follow up therapy are one component of a multi-disciplinary discharge planning process, led by the attending physician.  Recommendations may be updated based on patient status, additional functional criteria and insurance authorization. ?   ?Follow Up Recommendations ? Home health OT  ?  ?Assistance Recommended at Discharge Frequent or constant Supervision/Assistance  ?Patient can return home with the following ? A little help with walking and/or transfers;A little help with bathing/dressing/bathroom;Assistance with cooking/housework;Direct supervision/assist for medications management;Assist for transportation;Help with stairs or ramp for entrance ?  ?Equipment Recommendations ? Tub/shower seat  ?  ?Recommendations for Other Services   ? ?  ?Precautions / Restrictions Precautions ?Precautions: Fall;Other (comment) ?Precaution Comments: watch HR  ? ? ?  ? ?Mobility Bed Mobility ?  ?  ?  ?  ?  ?  ?  ?  ?  ? ?Transfers ?  ?  ?  ?  ?  ?  ?  ?  ?  ?  ?  ?  ?Balance Overall balance assessment: Needs assistance ?  ?Sitting balance-Leahy Scale: Fair ?  ?  ?Standing balance support: Single extremity supported ?Standing balance-Leahy  Scale: Poor ?  ?  ?  ?  ?  ?  ?  ?  ?  ?  ?  ?  ?   ? ?ADL either performed or assessed with clinical judgement  ? ?ADL Overall ADL's : Needs assistance/impaired ?  ?  ?Grooming: Wash/dry hands;Sitting;Set up ?  ?  ?  ?  ?  ?  ?  ?  ?  ?Toilet Transfer: Minimal assistance;BSC/3in1;Stand-pivot ?  ?Toileting- Clothing Manipulation and Hygiene: Set up;Sitting/lateral lean ?  ?  ?  ?  ?General ADL Comments: Pt comfortable remaining up in chair with family. Declined further ADL training.  ?  ? ?Extremity/Trunk Assessment   ?  ?  ?  ?  ?  ? ?Vision   ?  ?  ?Perception   ?  ?Praxis   ?  ? ?Cognition Arousal/Alertness: Awake/alert ?Behavior During Therapy: Flat affect ?Overall Cognitive Status: Impaired/Different from baseline ?Area of Impairment: Safety/judgement ?  ?  ?  ?  ?  ?  ?  ?  ?  ?  ?  ?  ?Safety/Judgement: Decreased awareness of deficits ?  ?  ?General Comments: family advocating for pt, requesting shower seat as pt has a make-shift seat in her shower currently ?  ?  ?   ?Exercises   ? ?  ?Shoulder Instructions   ? ? ?  ?General Comments    ? ? ?Pertinent Vitals/ Pain       Pain Assessment ?Pain Assessment: No/denies pain ? ?Home Living   ?  ?  ?  ?  ?  ?  ?  ?  ?  ?  ?  ?  ?  ?  ?  ?  ?  ?  ? ?  ?  Prior Functioning/Environment    ?  ?  ?  ?   ? ?Frequency ? Min 2X/week  ? ? ? ? ?  ?Progress Toward Goals ? ?OT Goals(current goals can now be found in the care plan section) ? Progress towards OT goals: Not progressing toward goals - comment (weaker today) ? ?Acute Rehab OT Goals ?OT Goal Formulation: With patient ?Time For Goal Achievement: 07/27/21 ?Potential to Achieve Goals: Fair  ?Plan Discharge plan remains appropriate   ? ?Co-evaluation ? ? ?   ?  ?  ?  ?  ? ?  ?AM-PAC OT "6 Clicks" Daily Activity     ?Outcome Measure ? ? Help from another person eating meals?: None ?Help from another person taking care of personal grooming?: A Little ?Help from another person toileting, which includes using toliet, bedpan,  or urinal?: A Little ?Help from another person bathing (including washing, rinsing, drying)?: A Little ?Help from another person to put on and taking off regular upper body clothing?: A Little ?Help from another person to put on and taking off regular lower body clothing?: A Little ?6 Click Score: 19 ? ?  ?End of Session   ? ?OT Visit Diagnosis: Unsteadiness on feet (R26.81);Muscle weakness (generalized) (M62.81) ?  ?Activity Tolerance Patient limited by fatigue ?  ?Patient Left in chair;with call bell/phone within reach;with family/visitor present ?  ?Nurse Communication   ?  ? ?   ? ?Time: 3968-8648 ?OT Time Calculation (min): 14 min ? ?Charges: OT General Charges ?$OT Visit: 1 Visit ?OT Treatments ?$Self Care/Home Management : 8-22 mins ? ?Nestor Lewandowsky, OTR/L ?Acute Rehabilitation Services ?Pager: [phone] ?Office: [phone]  ? ?[name] ?07/18/2021, 3:14 PM ?

## 2021-07-19 DIAGNOSIS — Z515 Encounter for palliative care: Secondary | ICD-10-CM | POA: Diagnosis not present

## 2021-07-19 DIAGNOSIS — I5023 Acute on chronic systolic (congestive) heart failure: Secondary | ICD-10-CM | POA: Diagnosis not present

## 2021-07-19 DIAGNOSIS — Z7189 Other specified counseling: Secondary | ICD-10-CM | POA: Diagnosis not present

## 2021-07-19 DIAGNOSIS — R079 Chest pain, unspecified: Secondary | ICD-10-CM | POA: Diagnosis not present

## 2021-07-19 LAB — GLUCOSE, CAPILLARY
Glucose-Capillary: 189 mg/dL — ABNORMAL HIGH (ref 70–99)
Glucose-Capillary: 400 mg/dL — ABNORMAL HIGH (ref 70–99)
Glucose-Capillary: 458 mg/dL — ABNORMAL HIGH (ref 70–99)
Glucose-Capillary: 312 mg/dL — ABNORMAL HIGH (ref 70–99)

## 2021-07-19 LAB — BASIC METABOLIC PANEL
Anion gap: 14 (ref 5–15)
BUN: 79 mg/dL — ABNORMAL HIGH (ref 8–23)
CO2: 22 mmol/L (ref 22–32)
Calcium: 8.3 mg/dL — ABNORMAL LOW (ref 8.9–10.3)
Chloride: 93 mmol/L — ABNORMAL LOW (ref 98–111)
Creatinine, Ser: 3.25 mg/dL — ABNORMAL HIGH (ref 0.44–1.00)
GFR, Estimated: 14 mL/min — ABNORMAL LOW (ref >60)
Glucose, Bld: 203 mg/dL — ABNORMAL HIGH (ref 70–99)
Potassium: 4 mmol/L (ref 3.5–5.1)
Sodium: 129 mmol/L — ABNORMAL LOW (ref 135–145)

## 2021-07-19 LAB — HEPARIN LEVEL (UNFRACTIONATED): Heparin Unfractionated: 0.58 IU/mL (ref 0.30–0.70)

## 2021-07-19 LAB — COOXEMETRY PANEL
Carboxyhemoglobin: 1.6 % — ABNORMAL HIGH (ref 0.5–1.5)
Methemoglobin: <0.7 % (ref 0.0–1.5)
O2 Saturation: 55.6 %
Total hemoglobin: 9.4 g/dL — ABNORMAL LOW (ref 12.0–16.0)

## 2021-07-19 LAB — CBC
HCT: 29.2 % — ABNORMAL LOW (ref 36.0–46.0)
Hemoglobin: 9.4 g/dL — ABNORMAL LOW (ref 12.0–15.0)
MCH: 28 pg (ref 26.0–34.0)
MCHC: 32.2 g/dL (ref 30.0–36.0)
MCV: 86.9 fL (ref 80.0–100.0)
Platelets: 216 10*3/uL (ref 150–400)
RBC: 3.36 MIL/uL — ABNORMAL LOW (ref 3.87–5.11)
RDW: 20.5 % — ABNORMAL HIGH (ref 11.5–15.5)
WBC: 5.6 10*3/uL (ref 4.0–10.5)
nRBC: 0.9 % — ABNORMAL HIGH (ref 0.0–0.2)

## 2021-07-19 MED ORDER — INSULIN ASPART 100 UNIT/ML IJ SOLN
15.0000 [IU] | Freq: Once | INTRAMUSCULAR | Status: AC
  Administered 2021-07-19: 15 [IU] via SUBCUTANEOUS

## 2021-07-19 MED ORDER — TORSEMIDE 20 MG PO TABS
60.0000 mg | ORAL_TABLET | Freq: Two times a day (BID) | ORAL | Status: DC
  Administered 2021-07-20: 60 mg via ORAL
  Filled 2021-07-19: qty 3

## 2021-07-19 MED ORDER — TORSEMIDE 20 MG PO TABS
40.0000 mg | ORAL_TABLET | Freq: Two times a day (BID) | ORAL | Status: AC
  Administered 2021-07-19: 40 mg via ORAL
  Filled 2021-07-19: qty 2

## 2021-07-19 NOTE — TOC Initial Note (Signed)
Transition of Care (TOC) - Initial/Assessment Note  ? ? ?Patient Details  ?Name: Karen West ?MRN: [id number] ?Date of Birth: [birth year] ? ?Transition of Care [facility]) CM/SW Contact:    ?Marcheta Grammes Rexene Alberts, RN ?Phone Number: [phone] ?07/19/2021, 2:47 PM ? ?Clinical Narrative:                 ?HF TOC CM spoke to dtr, Anderson [place] and offered choice for Home Hospice. Agreeable to [facility]. Contacted [facility] with new referral. Message received from [place] that referral was received. Dtr states she does not want pt transported home if she will not be able to dc safely after IV medication discontinued. Explained that pt will not transport if she is not able sustain ride home via ambulance. Attending updated. Explained Home Hospice rep will call to speak to her and arrange needed DME for home.  ? ?Expected Discharge Plan: [place] ?Barriers to Discharge: Continued Medical Work up ? ? ?Patient Goals and CMS Choice ?Patient states their goals for this hospitalization and ongoing recovery are:: would lke to home if she can make is safe ?CMS Medicare.gov Compare Post Acute Care list provided to:: Patient Represenative (must comment) (dtr-Jennifer Jimmye Norman) ?Choice offered to / list presented to : Patient ? ?Expected Discharge Plan and Services ?Expected Discharge Plan: [place] ?  ?Discharge Planning Services: CM Consult ?Post Acute Care Choice: Hospice ?Living arrangements for the past 2 months: [place] ?                ?  ?  ?  ?  ?  ?HH Arranged: RN ?[place] Agency: [facility] ?Date HH Agency Contacted: 07/19/21 ?Time [place]: 1660 ?Representative spoke with at [place]: Farrel Gordon ? ?Prior Living Arrangements/Services ?Living arrangements for the past 2 months: [place] ?Lives with:: Adult Children ?Patient language and need for interpreter reviewed:: Yes ?Do you feel safe going back to the place where you live?: Yes       ?Need for Family Participation in Patient Care: Yes (Comment) ?Care giver support system in place?: Yes (comment) ?Current home services:  Librarian, academic, bedside commode) ?Criminal Activity/Legal Involvement Pertinent to Current Situation/Hospitalization: No - Comment as needed ? ?Activities of Daily Living ?Home Assistive Devices/Equipment: Gilford Rile (specify type) ?ADL Screening (condition at time of admission) ?Patient's cognitive ability adequate to safely complete daily activities?: Yes ?Is the patient deaf or have difficulty hearing?: No ?Does the patient have difficulty seeing, even when wearing glasses/contacts?: No ?Does the patient have difficulty concentrating, remembering, or making decisions?: No ?Patient able to express need for assistance with ADLs?: Yes ?Does the patient have difficulty dressing or bathing?: Yes ?Independently performs ADLs?: No ?Does the patient have difficulty walking or climbing stairs?: Yes ?Weakness of Legs: Both ?Weakness of Arms/Hands: Both ? ?Permission Sought/Granted ?Permission sought to share information with : Case Manager, Family Supports, PCP ?Permission granted to share information with : Yes, Verbal Permission Granted ? Share Information with NAME: Roslind Michaux ? Permission granted to share info w AGENCY: Home Hospice ? Permission granted to share info w Relationship: daughter ? Permission granted to share info w Contact Information: [phone] ? ?Emotional Assessment ?Appearance:: Appears stated age ?Attitude/Demeanor/Rapport: Engaged, Gracious ?Affect (typically observed): Accepting ?Orientation: : Oriented to Self, Oriented to Place, Oriented to  Time, Oriented to Situation ?  ?Psych Involvement: No (comment) ? ?Admission diagnosis:  Chest pain [R07.9] ?Patient Active Problem List  ?  Diagnosis Date Noted  ? Acute on chronic systolic CHF (congestive heart failure) ([place]) 07/13/2021  ? Heart failure, type unknown ([place]) 06/17/2021  ? Nonrheumatic mitral valve  regurgitation   ? STEMI involving left circumflex coronary artery ([place]) 04/23/2021  ? Polyneuropathy due to type 2 diabetes mellitus ([place]) 02/04/2021  ? Chest pain 12/23/2020  ? Proteinuria 10/29/2020  ? Coronary artery disease involving native coronary artery of native heart without angina pectoris 06/22/2020  ? Abnormal results of liver function studies 03/05/2020  ? Acute non-ST segment elevation myocardial infarction ([place]) 03/05/2020  ? Anemia in chronic kidney disease 03/05/2020  ? Atherosclerosis of both carotid arteries 03/05/2020  ? Atrial tachycardia ([place]) 03/05/2020  ? Cardiomegaly 03/05/2020  ? Chronic kidney disease, stage 4 (severe) ([place]) 03/05/2020  ? Chronic constipation 03/05/2020  ? Cyst and pseudocyst of pancreas 03/05/2020  ? Cyst of kidney, acquired 03/05/2020  ? Dependence on other enabling machines and devices 03/05/2020  ? Diabetic renal disease ([place]) 03/05/2020  ? Diverticulosis of colon 03/05/2020  ? Dysgeusia 03/05/2020  ? Encounter for general adult medical examination without abnormal findings 03/05/2020  ? Fatigue 03/05/2020  ? Hard of hearing 03/05/2020  ? Headache 03/05/2020  ? History of musculoskeletal disease 03/05/2020  ? Hoarseness 03/05/2020  ? Hypercalcemia 03/05/2020  ? Hyperglycemia due to type 2 diabetes mellitus ([place]) 03/05/2020  ? Hypomagnesemia 03/05/2020  ? Long term (current) use of aspirin 03/05/2020  ? Increased frequency of urination 03/05/2020  ? Idiopathic scoliosis 03/05/2020  ? Myalgia 03/05/2020  ? Palpitations 03/05/2020  ? Personal history of colonic polyps 03/05/2020  ? 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  ? Seborrheic keratosis 03/05/2020  ? Toe pain 03/05/2020  ? Pulmonary valve disorder 03/05/2020  ? Personal history of malignant neoplasm of breast 03/05/2020  ? Vitamin D deficiency 03/05/2020  ? Rhabdomyolysis due to statin therapy 08/22/2019  ? Carotid artery disease ([place])   ? Acute renal failure superimposed on stage 3 chronic kidney disease ([place])    ? Elevated transaminase level   ? ST elevation myocardial infarction involving left circumflex coronary artery ([place]) 07/02/2019  ? Dysphonia 05/21/2018  ? Pelvic floor relaxation 10/02/2017  ? Blood in urine 10/02/2017  ? Left lower quadrant pain 10/02/2017  ? Nausea 10/02/2017  ? Nocturia 10/02/2017  ? Osteopenia 10/02/2017  ? Pain in pelvis 10/02/2017  ? Adenocarcinoma of left lung, stage 1 ([place]) 07/26/2017  ? Iron deficiency anemia 07/26/2017  ? S/P lobectomy of lung   ? Long QT interval   ? Sinus tachycardia   ? Abnormal positron emission tomography (PET) scan 05/06/2017  ? Pain of left shoulder joint on movement 04/09/2017  ? Rib pain 04/09/2017  ? BPPV (benign paroxysmal positional vertigo), left 03/27/2017  ? Lipoma 04/06/2016  ? Hypersomnia 01/18/2016  ? Upper airway resistance syndrome 01/18/2016  ? Hyperthyroidism 10/05/2011  ? Chest pain at rest 10/04/2011  ? Angina at rest [facility]) 10/04/2011  ? Headache(784.0) 10/04/2011  ? FH: MI (myocardial infarction) 10/04/2011  ? Chest pressure 10/04/2011  ? HLD (hyperlipidemia)   ? Hypertension   ? OSA on CPAP   ? Breast cancer ([place])   ? Chronic anemia   ? GERD (gastroesophageal reflux disease)   ? Obesity   ? Osteoarthritis   ? Right bundle branch block   ? Atherosclerosis of aorta ([place])   ? Multiple thyroid nodules   ? Type II or unspecified type diabetes mellitus without mention of complication, uncontrolled   ? Chronic headaches   ? OBESITY 11/17/2009  ?  OBSTRUCTIVE SLEEP APNEA 01/05/2009  ? Type 2 diabetes mellitus with complication, without long-term current use of insulin ([place]) 11/12/2008  ? Hyperlipidemia 11/12/2008  ? Essential hypertension 11/12/2008  ? Lung nodule 11/12/2008  ? G E R D 11/12/2008  ? UNSPECIFIED DISORDER OF KIDNEY AND URETER 11/12/2008  ? OSTEOARTHRITIS 11/12/2008  ? ?PCP:  Deland Pretty, MD ?Pharmacy:   ?[id number] [address] ?[place] ?[place] [address] ?Phone: [phone] Fax:  [phone] ? ?[facility] ?[address] ?[place] [address] ?Phone: [phone] Fax: [phone] ? ? ? ? ?Social Determinants of Health (SDOH) Interventions ?  ? ?R

## 2021-07-19 NOTE — Progress Notes (Addendum)
? ? Advanced Heart Failure Rounding Note ? ?PCP-Cardiologist: Minus Breeding, MD  ?AHF: Dr. Haroldine Laws  ? ?Subjective:   ? ?4/25: Admitted w/ a/c CHF w/ low output. PICC placed. Initial Co-ox 57%. Started on milrinone 0.25 + IV Lasix.   ?4/25 LE Venous Dopplers + for left DVT. On heparin gtt.  ?4/26 A fib RVR. Loaded on amio. Milrinone stopped. CO-OX 43%. Milrinone 0.125 mcg restarted  ?4/29 Milrinone increased to 0.25  ?5/1 Milrinone increased to 0.375 mcg. Uric acid 20.  ? ?Diuresed with IV lasix. Sluggish diuresis.  Creatinine trending up 2.6>3.25 ? ?Currently on amio drip @ 30/hr and milrinone 0.375 mcg. CO-OX 55% ? ? ?No appetite today. Slept ok.  ? ? ?Objective:   ?Weight Range: ?64.2 kg ?Body mass index is 26.74 kg/m?.  ? ?Vital Signs:   ?Temp:  [97.3 ?F (36.3 ?C)-97.7 ?F (36.5 ?C)] 97.7 ?F (36.5 ?C) (05/02 0440) ?Pulse Rate:  [74-83] 83 (05/02 0440) ?Resp:  [18] 18 (05/01 1552) ?BP: (99-118)/(48-76) 118/76 (05/02 0440) ?SpO2:  [96 %-97 %] 97 % (05/01 2027) ?Weight:  [64.2 kg] 64.2 kg (05/02 0440) ?Last BM Date : 07/16/21 ? ?Weight change: ?Filed Weights  ? 07/17/21 0425 07/18/21 0419 07/19/21 0440  ?Weight: 66.3 kg 64.5 kg 64.2 kg  ? ? ?Intake/Output:  ? ?Intake/Output Summary (Last 24 hours) at 07/19/2021 0824 ?Last data filed at 07/18/2021 2100 ?Gross per 24 hour  ?Intake 660.19 ml  ?Output 300 ml  ?Net 360.19 ml  ? ?  ?CVP 9-10  ? ?Physical Exam  ?General:  In bed. Appears weak. . No resp difficulty ?HEENT: normal ?Neck: supple. JVP 9-10 . Carotids 2+ bilat; no bruits. No lymphadenopathy or thryomegaly appreciated. ?Cor: PMI nondisplaced. Regular rate & rhythm. No rubs, gallops. 3/6 MR . ?Lungs: clear ?Abdomen: soft, nontender, nondistended. No hepatosplenomegaly. No bruits or masses. Good bowel sounds. ?Extremities: no cyanosis, clubbing, rash, edema. L foot tender.  ?Neuro: alert & orientedx3, cranial nerves grossly intact. moves all 4 extremities w/o difficulty. Affect pleasant ? ? ? ?Telemetry  ? ?SR  70-80 with PACs/PVCs  ? ? ?Labs  ?  ?CBC ?Recent Labs  ?  07/18/21 ?0502 07/19/21 ?0339  ?WBC 6.7 5.6  ?HGB 10.0* 9.4*  ?HCT 29.8* 29.2*  ?MCV 85.9 86.9  ?PLT 207 216  ? ?Basic Metabolic Panel ?Recent Labs  ?  07/18/21 ?0502 07/19/21 ?0339  ?NA 129* 129*  ?K 4.0 4.0  ?CL 94* 93*  ?CO2 21* 22  ?GLUCOSE 132* 203*  ?BUN 74* 79*  ?CREATININE 2.62* 3.25*  ?CALCIUM 8.3* 8.3*  ? ?Liver Function Tests ?No results for input(s): AST, ALT, ALKPHOS, BILITOT, PROT, ALBUMIN in the last 72 hours. ? ?No results for input(s): LIPASE, AMYLASE in the last 72 hours. ? ?Cardiac Enzymes ?No results for input(s): CKTOTAL, CKMB, CKMBINDEX, TROPONINI in the last 72 hours. ? ?BNP: ?BNP (last 3 results) ?Recent Labs  ?  04/27/21 ?1937 06/17/21 ?0957 06/29/21 ?1818  ?BNP 1,077.8* 2,771.3* 3,309.2*  ? ? ?ProBNP (last 3 results) ?No results for input(s): PROBNP in the last 8760 hours. ? ? ?D-Dimer ?No results for input(s): DDIMER in the last 72 hours. ?Hemoglobin A1C ?No results for input(s): HGBA1C in the last 72 hours. ?Fasting Lipid Panel ?No results for input(s): CHOL, HDL, LDLCALC, TRIG, CHOLHDL, LDLDIRECT in the last 72 hours. ?Thyroid Function Tests ?No results for input(s): TSH, T4TOTAL, T3FREE, THYROIDAB in the last 72 hours. ? ?Invalid input(s): FREET3 ? ?Other results: ? ? ?Imaging  ? ? ?No  results found. ? ? ?Medications:   ? ? ?Scheduled Medications: ? aspirin EC  81 mg Oral Daily  ? Chlorhexidine Gluconate Cloth  6 each Topical Daily  ? dextromethorphan  60 mg Oral BID  ? furosemide  80 mg Intravenous BID  ? insulin aspart  0-5 Units Subcutaneous QHS  ? insulin aspart  0-9 Units Subcutaneous TID WC  ? insulin glargine-yfgn  10 Units Subcutaneous Daily  ? levothyroxine  88 mcg Oral QAC breakfast  ? pantoprazole  40 mg Oral Daily  ? [START ON 07/20/2021] predniSONE  20 mg Oral Q breakfast  ? predniSONE  30 mg Oral Q breakfast  ? senna-docusate  2 tablet Oral QHS  ? sodium chloride flush  10-40 mL Intracatheter Q12H  ? ticagrelor  90 mg  Oral BID  ? ? ?Infusions: ? amiodarone 30 mg/hr (07/18/21 2100)  ? heparin 900 Units/hr (07/19/21 0540)  ? milrinone 0.375 mcg/kg/min (07/18/21 2318)  ? ? ?PRN Medications: ?benzonatate, HYDROmorphone (DILAUDID) injection, LORazepam, melatonin, nitroGLYCERIN, oxyCODONE, polyethylene glycol, prochlorperazine, sodium chloride flush ? ? ? ?Patient Profile  ? ?82 y.o. female with CAD s/p recent inferolateral STEMI 2/23 w/ PCI to the RCA, severe (suspected infarct related) mitral regurgitation not candidate for TEER and being considered for Percutaneous [facility] at [facility], chronic systolic heart failure w/ EF 35-40%, Stage IV CKD, HTN, anemia, DM and sleep apnea.  ? ? ?Admitted with increased dyspnea/abdominal pain in the setting of A/C HFrEF and suspected low output and AKI on CKD. Also found to have Lt LE DVT.  ? ?Assessment/Plan  ? ?1. A/C HFrEF, ICM  ?-Echo (2/23): EF 30-35% and severe MR ?-TEE (2/23): 35-40% severe MR ?-RHC (2/23): showed normal cardiac output  ?-Presented increased dyspnea and abd pain and low output, Co-ox 57% ?-needs surgical management of severe MR however not current surgical candidate w/ co morbidities.  ?- Echo 4/23  EF 35-40% RV normal. Severe MR ?-Now on milrinone 0.375 mcg, CO-OX 56%.  ?- CVP 9-10. Worsening renal function. Hold lasix.  ?- No bb with low output.  ?-Remains on amio drip.  ?-Dr Ali Lowe consulted.She is not a candidate for  tMVR. Unfortunately no other options.  ?  ?2. Transaminitis  ?- Abd [place] neg for cholecystitis.  ?- Due to hepatic congestion/low output hf.  ?- LFTs downtrending  ?  ?3. Severe MR  ?- TEE 2/23 reviewed EF 35-40% severe MR. ?- Reviewed with Structural Team. Not candidate for tMVR.   ? ?4. CKD Stage IV ?-Recent creatinine baseline 2.2-2.4 ?-SCr 2.08>>2.33 >>2.48>>2.59 > 2.49 > 2.68> 3.25   ?-Suspect cardiorenal in setting of low output HF  ?-continue milrinone  ?  ?5. Anemia of chronic illness ?-Hgb 10->9.4  ?-monitor w/ heparin gtt, asa and  Brilinta  ?  ?6. DMII ?-Hgb A1C 6 04/2021  ?-On sliding scale.  ?  ?7. CAD  ? Late presentation STEMI that compounded her chronic moderate mitral regurgitation.  ?-Had recent DES to LCx. On asa and brillinta. Resume statin ?-HS Trop 81>71>87 ?-No chest pain.  ?-she now has LE DVT and will need a/c. PCI > 30 days ago. Can stop ASA once transitioned to [place]. Will need to change Brilitna to Plavix to reduce bleed risk given chronic anemia. But doubt she will be able to get out of hospital with her severe HF ? - Continue PPI    ?  ?8. PVCs  ?-continue amio drip.  ? ?9. LLE DVT ?- new this admit, on heparin  gtt. PE has not been ruled out. No chest CT w/ CKD. ?-repeat limited echo w/ normal RV  ? ?10.  Paroxysmal A Fib RVR ?-4/26 Developed  A fib RVR. Loading amio drip.  ?- Converted to SR.   ?- Continue amio while on milrinone.  ?-Continue heparin drip.  ? ?11. Acute Gout  ?-Uric Acid 20.  ?- On prednisone.   ? ?12. GOC ?Palliative Care following. DNR/DNI ?Waiting hear from request to see her son. Rosalyn Gess has the letter hopefully we will be notified today. With worsening renal function concern we are running out of time.  ?Palliative Care following. ? ?Length of Stay: 7 ? ?Darrick Grinder, NP  ?07/19/2021, 8:24 AM ? ?Advanced Heart Failure Team ?Pager [phone] (M-F; 7a - 5p)  ?Please contact [facility] for night-coverage after hours (5p -7a ) and weekends on [email or web address] ? ?Patient seen and examined with the above-signed Advanced Practice Provider and/or Housestaff. I personally reviewed laboratory data, imaging studies and relevant notes. I independently examined the patient and formulated the important aspects of the plan. I have edited the note to reflect any of my changes or salient points. I have personally discussed the plan with the patient and/or family. ? ?Feels ok today. Remains on milrinone.  Scr worse. Was able to visit with her son today.   ? ?Wants to go home tomorrow am with Hospice.  ? ?General:  Weak appearing.  No resp difficulty ?HEENT: normal ?Neck: supple. JVP to jaw  Carotids 2+ bilat; no bruits. No lymphadenopathy or thryomegaly appreciated. ?Cor: PMI nondisplaced. Regular rate & rhythm. 3/6 MR ?Lungs: clear

## 2021-07-19 NOTE — Plan of Care (Signed)
?  Problem: Education: ?Goal: Knowledge of General Education information will improve ?Description: Including pain rating scale, medication(s)/side effects and non-pharmacologic comfort measures ?Outcome: Progressing ?  ?Problem: Health Behavior/Discharge Planning: ?Goal: Ability to manage health-related needs will improve ?Outcome: Progressing ?  ?Problem: Pain Managment: ?Goal: General experience of comfort will improve ?Outcome: Progressing ?  ?Problem: Coping: ?Goal: Level of anxiety will decrease ?Outcome: Progressing ?  ?Problem: Safety: ?Goal: Ability to remain free from injury will improve ?Outcome: Progressing ?  ?

## 2021-07-19 NOTE — Progress Notes (Signed)
? ?Palliative Medicine Inpatient Follow Up Note ? ?HPI: ?81 y.o. 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  Found to have EF 25 to 30%.  Found to have severe mitral regurgitation and not a surgical candidate.  Remains on milrinone infusion.  Patient also with acute on chronic kidney disease, creatinine worsening.  PMT consulted to discuss goals of care. ? ?Today's Discussion (07/19/2021): ? ?*Please note that this is a verbal dictation therefore any spelling or grammatical errors are due to the "[place] One" system interpretation. ? ?Chart reviewed inclusive of vital signs, progress notes, laboratory results, and diagnostic images.  ? ?I met with Karen West this morning in the presence of her sister. We reviewed that the cardiology team was still in process of identifying if there were any additional treatment options with the team in [place].  ? ?If not, she is aware of her short term prognosis.  ? ?We are still hoping for patients son to be able to visit with her from prison. ?____________________________ ?Addendum: ? ?Karen West is not a candidate for a trial tMVR.  She shares awareness that her time is coming and she is not afraid of death.  ? ?We reviewed the plan for her con, Karen West to visit this afternoon. She is in agreement with going home on hospice thereafter.  ? ?I checked on on patient five other family members who were present. None had questions. They all emphasize that it has been Karen West's last wish to see her son. ? ?Created space and opportunity for patient to explore thoughts feelings and fears regarding current medical situation. Patient was very tearful sharing that she knows  and understands what is going on.  ? ?We reviewed the role of hospice. I described hospice as a service for patients who have a life expectancy of 6 months or less.  ?The goal of hospice is the preservation of dignity and quality at the end phases of life. Under hospice care, the focus changes from curative to symptom relief. Patient shares this is important as she does not want to suffer. ? ?Questions and concerns addressed  ? ?Palliative Support Provided ? ?Objective Assessment: ?Vital Signs ?Vitals:  ? 07/18/21 2027 07/19/21 0440  ?BP:  118/76  ?Pulse:  83  ?Resp:    ?Temp:  97.7 ?F (36.5 ?C)  ?SpO2: 97%   ? ? ?Intake/Output Summary (Last 24 hours) at 07/19/2021 1051 ?Last data filed at 07/18/2021 2100 ?Gross per 24 hour  ?Intake 660.19 ml  ?Output 300 ml  ?Net 360.19 ml  ? ?Last Weight  Most recent update: 07/19/2021  4:44 AM  ? ? Weight  ?64.2 kg (141 lb 8 oz)  ?      ? ?  ? ?Physical Exam ?Vitals and nursing note reviewed.  ?HENT:  ?   Head: Normocephalic and atraumatic.  ?Cardiovascular:  ?   Rate and Rhythm: Normal rate.  ?Pulmonary:  ?   Effort: Pulmonary effort is normal. No respiratory distress.  ?Neurological:  ?   General: No focal deficit present.  ?   Mental Status: She is alert.  ?Psychiatric:     ?   Mood and Affect: Mood normal.     ?   Behavior: Behavior normal.  ? ?  SUMMARY OF RECOMMENDATIONS   ?Remain DNR ?Continue to treat the treatable ?Not a candidate for tMVR ?Son, Karen West will visit from prison this afternoon ?Plan for patient to transition home with hospice after son visits ?PMT will continue to follow ? ?MDM - High ?______________________________________________________________________________________ ?Tacey Ruiz ?[place] Team ?Team Cell Phone: [phone] ?Please utilize secure chat with additional questions, if there is no response within 30 minutes please call the above phone number ? ?Palliative Medicine Team providers are available by phone from 7am to 7pm  daily and can be reached through the team cell phone.  ?Should this patient require assistance outside of these hours, please call the patient's attending physician. ? ? ? ? ?

## 2021-07-19 NOTE — Care Management Important Message (Signed)
Important Message ? ?Patient Details  ?Name: Karen West ?MRN: [id number] ?Date of Birth: 01-04-1941 ? ? ?Medicare Important Message Given:  Yes ? ? ? ? ?[name] ?07/19/2021, 11:17 AM ?

## 2021-07-19 NOTE — Plan of Care (Signed)
?  Problem: Nutrition ?Goal: Patient maintains adequate hydration ?Outcome: Progressing ?  ?Problem: Education: ?Goal: Knowledge of General Education information will improve ?Description: Including pain rating scale, medication(s)/side effects and non-pharmacologic comfort measures ?Outcome: Progressing ?  ?Problem: Health Behavior/Discharge Planning: ?Goal: Ability to manage health-related needs will improve ?Outcome: Progressing ?  ?Problem: Clinical Measurements: ?Goal: Ability to maintain clinical measurements within normal limits will improve ?Outcome: Progressing ?Goal: Will remain free from infection ?Outcome: Progressing ?Goal: Diagnostic test results will improve ?Outcome: Progressing ?Goal: Respiratory complications will improve ?Outcome: Progressing ?Goal: Cardiovascular complication will be avoided ?Outcome: Progressing ?  ?Problem: Activity: ?Goal: Risk for activity intolerance will decrease ?Outcome: Progressing ?  ?Problem: Nutrition: ?Goal: Adequate nutrition will be maintained ?Outcome: Progressing ?  ?Problem: Coping: ?Goal: Level of anxiety will decrease ?Outcome: Progressing ?  ?Problem: Elimination: ?Goal: Will not experience complications related to bowel motility ?Outcome: Progressing ?Goal: Will not experience complications related to urinary retention ?Outcome: Progressing ?  ?Problem: Pain Managment: ?Goal: General experience of comfort will improve ?Outcome: Progressing ?  ?Problem: Safety: ?Goal: Ability to remain free from injury will improve ?Outcome: Progressing ?  ?Problem: Skin Integrity: ?Goal: Risk for impaired skin integrity will decrease ?Outcome: Progressing ?  ?

## 2021-07-19 NOTE — Progress Notes (Signed)
This chaplain responded to consult for spiritual care. The chaplain was updated by the PMT NP-Michelle before the visit. The chaplain understands the additional treatment in [place] is not possible and [name] visited today from prison.  ? ?The Pt. is sitting on the bedside celebrating a place of peace after Karen West's visit; sometimes smiling and sometimes crying. [name] and additional family joined the Pt. at the bedside. The storytelling included God's love in many shapes and forms. ? ?The chaplain listened reflectively as the Pt. referenced Karen [place] and the chaplain later read the scripture.  The Pt. describes herself as ready for death with the promise of "no pain or suffering" from the medical team.  The chaplain prayed with the Pt. and family before they enjoyed lunch together. ? ?Chaplain Sallyanne Kuster ?[phone] ?

## 2021-07-19 NOTE — TOC CM/SW Note (Signed)
HF TOC CM contacted [facility] # [phone] spoke to [place]. Pt was approved by warden for visit today. Pt will be transporting within the hour to hospital via [place]. Spoke to dtr and she does have money order to give to officer on arrival. [place], Heart Failure TOC CM [phone]  ?

## 2021-07-19 NOTE — Progress Notes (Signed)
ANTICOAGULATION CONSULT NOTE - Follow Up Consult ? ?Pharmacy Consult for Heparin ?Indication: LLE DVT / Afib ? ?Allergies  ?Allergen Reactions  ? Tramadol Nausea And Vomiting and Other (See Comments)  ?  "got sick to my stomach and was throwing up blood; it put me in the hospital")  ? Lisinopril Rash and Other (See Comments)  ?  Renal failure ?  ? Rosuvastatin Other (See Comments)  ?  Other reaction(s): rhabdo  ? Tapazole [Methimazole] Itching and Rash  ? ? ?Patient Measurements: ?Height: 5\' 1"  (154.9 cm) ?Weight: 64.2 kg (141 lb 8 oz) ?IBW/kg (Calculated) : 47.8 ?Heparin Dosing Weight: 62 kg ? ?Vital Signs: ?Temp: 97.7 ?F (36.5 ?C) (05/02 0440) ?Temp Source: Oral (05/02 0440) ?BP: 118/76 (05/02 0440) ?Pulse Rate: 83 (05/02 0440) ? ?Labs: ?Recent Labs  ?  07/17/21 ?0446 07/17/21 ?4332 07/17/21 ?1154 07/18/21 ?0502 07/19/21 ?0339  ?HGB 10.0*  --   --  10.0* 9.4*  ?HCT 29.4*  --   --  29.8* 29.2*  ?PLT 196  --   --  207 216  ?HEPARINUNFRC  --    < > 0.45 0.61 0.58  ?CREATININE 2.68*  --   --  2.62* 3.25*  ? < > = values in this interval not displayed.  ? ? ? ?Estimated Creatinine Clearance: 12.3 mL/min (A) (by C-G formula based on SCr of 3.25 mg/dL (H)). ? ?Assessment: ?81 yo female with LLE DVT per duplex and started IV heparin on 07/12/21. No anticoagulants noted PTA but noted on ticagrelor and DES placed 04/23/21.  Had received Enoxaparin 30 mg x 1 on 4/25 at ~9am. ?Heparin drip 900 uts/hr running through PICC line with restricted arm access, monitor for accuracy of heparin levels  ? ?Heparin level 0.58 this am at goal  ?Cbc stable no bleeding noted  ?Plan for oral anticoagulation (Xarelto) when procedures are completed. ? ?Goal of Therapy:  ?Heparin level 0.3-0.7 units/ml ?Monitor platelets by anticoagulation protocol: Yes ?  ?Plan:  ?Continue heparin drip at 900 units/hr ?Next heparin level and CBC in am. ? ?Erin Hearing PharmD., BCPS ?Clinical Pharmacist ?07/19/2021 3:01 PM ? ?

## 2021-07-19 NOTE — Progress Notes (Signed)
?PROGRESS NOTE ? ? ? ?CHASMINE LENDER  IWP:[id number] DOB: 18-Jan-1941 DOA: 07/11/2021 ?PCP: Deland Pretty, MD  ? ? 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  In the emergency room afebrile.  Blood pressure stable.  Respiration 30.  93% on room air.  Potassium 3, creatinine 2.34, transaminases elevated. ? ? ?Assessment & Plan: ?  ?Acute on chronic systolic congestive heart failure. ?Ischemic cardiomyopathy with known ejection fraction 25 to 30% ?Severe mitral regurgitation, not a surgical candidate.  Fluid overload secondary to valvular heart disease ?Coronary artery disease status post coronary stents on dual antiplatelet therapy. ?Poor functional status, advanced kidney disease. ?Severe debility and frailty. ? ?Patient with severe cardiovascular disease.  ?Admitted the hospital and managed by heart failure team, structural heart team as well as consultation with structural heart team in [place].  See goal of care discussion below.  ?Treated with milrinone infusion and IV Lasix, will discontinue.  Start torsemide 40 mg twice daily.  Continue metoprolol as she is tolerating.  ?Continue aspirin and Brilinta.  ?We will discontinue heparin, no long-term benefits.  ?Going home with hospice tomorrow. ? ?Acute left lower extremity DVT: on heparin.  Discontinued.  No long-term benefits.  Limited to life expectancy. ? ?Acute on chronic kidney disease stage IV: Renal functions continues to worsen. ? ?Hypokalemia: Replaced and  adequate. ? ?Congestive hepatopathy: LFTs stable. ? ?Type 2 diabetes, well controlled.  On insulin at home.  Remains on insulin and is stable. ? ?New onset A-fib: Currently converted to sinus rhythm.  Remains on amiodarone.  We will discontinue IV drip. ? ?Left ankle pain: Acute gouty arthritis.  Uric acid 20.  Colchicine is contraindicated with her renal functions.  Prednisone 40 mg now, she responded very well.  Will prescribe for 3 days. ? ?Goal of care: ?See palliative care discussions.  Patient with extensive cardiovascular disease, exhausted options and limited life expectancy.  Currently only able to be supported by milrinone drip and very high doses.  Agreeable with home hospice after discussion with multiple cardiology providers and palliative providers.   ?Her son was able to visit her today from prison.  All family respected patient's wishes to die peacefully at home. ? ?Discontinue milrinone infusion, discontinue IV amiodarone, discontinue IV heparin.  Changed to oral torsemide for symptom control.  All other symptoms, medications available. ?Home hospice consulted. ?Monitor overnight to determine safe transfer to home hospice, if worsening hemodynamics will need inpatient hospice.  She will probably make it home that she wishes. ? ?DVT prophylaxis: Place TED hose Start: 07/14/21 1246  Heparin infusion. ? ? ?Code Status: DNR/DNI ?Family Communication: Sister, daughter, multiple family members at home. ?Disposition Plan: Status is: Inpatient ?Remains inpatient appropriate because: Severe cardiovascular disease.  Home hospice arrangements to be made. ?  ? ? ?Consultants:  ?Cardiology ?Palliative care ? ?Procedures:  ?None ? ?Antimicrobials:  ?None ? ? ?Subjective: ? ?Patient seen and examined in the morning rounds.  Multiple palliative discussions, family discussions.  She has agreed and wishes to go home with home hospice. ?Family wanted to see how she does after she is off vasopressors  infusions. ?Left  ankle responded well to prednisone, ?In the morning rounds, she denied any complaints.  She slept very well.  Looked comfortable and on room air. ? ?Objective: ?Vitals:  ? 07/18/21 0419 07/18/21 1552 07/18/21 2027 07/19/21 0440  ?BP: 107/67 (!) 101/48 (!) 99/58 118/76  ?Pulse: 77 75 74 83  ?Resp:  18    ?Temp: 97.7 ?F (36.5 ?C) (!) 97.5 ?F (36.4 ?C) (!) 97.3 ?F (36.3 ?C) 97.7 ?F (36.5 ?C)  ?TempSrc: Oral Oral Axillary Oral  ?SpO2: 97% 96% 97%   ?Weight: 64.5 kg   64.2 kg  ?Height:      ? ? ?Intake/Output Summary (Last 24 hours) at 07/19/2021 1633 ?Last data filed at 07/18/2021 2100 ?Gross per 24 hour  ?Intake 224.9 ml  ?Output 300 ml  ?Net -75.1 ml  ? ? ? ?Filed Weights  ? 07/17/21 0425 07/18/21 0419 07/19/21 0440  ?Weight: 66.3 kg 64.5 kg 64.2 kg  ? ? ?Examination: ? ?General: Looks very comfortable.  On room air. ?Cardiovascular: S1-S2 normal.  Pansystolic murmur at the mitral area. ?Jugular veins are engorged. ?Respiratory: Bilateral clear.  Occasional crackles at the base. ?Gastrointestinal: Soft.  Nontender.  Bowel sound present. ?Ext: No edema.  Mild tenderness left ankle. ?Neuro: Intact. ?Musculoskeletal: No deformities. ? ? ? ? ? ? ?Data Reviewed: I have personally reviewed following labs and imaging studies ? ?CBC: ?Recent Labs  ?Lab 07/13/21 ?4627 07/14/21 ?0350 07/15/21 ?0350 07/16/21 ?0938 07/17/21 ?0446 07/18/21 ?0502 07/19/21 ?0339  ?WBC 9.3   < > 7.6 6.6 6.3 6.7 5.6  ?NEUTROABS 7.6  --   --   --   --   --   --   ?HGB 9.4*   < > 10.1* 9.7* 10.0* 10.0* 9.4*  ?HCT 29.9*   < > 30.5* 28.6* 29.4* 29.8* 29.2*  ?MCV 91.7   < > 89.4 86.4 86.7 85.9 86.9  ?PLT 187   < > 207 190 196 207 216  ? < > = values in this interval not displayed.  ? ? ?Basic Metabolic Panel: ?Recent Labs  ?Lab 07/13/21 ?0507 07/14/21 ?0350 07/15/21 ?1829 07/16/21 ?9371 07/17/21 ?0446 07/18/21 ?0502 07/19/21 ?0339  ?NA 137   < > 132* 127* 127* 129* 129*  ?K 4.0   < > 3.8 3.1* 4.7 4.0 4.0  ?CL 106   < > 99 92* 94* 94* 93*  ?CO2 19*   < > 20*  19* 19* 21* 22  ?GLUCOSE 188*   < > 160* 193* 194* 132* 203*  ?BUN 77*   < > 78* 76* 74* 74* 79*  ?CREATININE 2.33*   < > 2.59* 2.49* 2.68* 2.62* 3.25*  ?CALCIUM 7.9*   < > 7.9* 7.9* 8.1* 8.3* 8.3*  ?MG 2.2  --   --   --   --   --   --   ? < > = values in this interval not displayed.  ? ? ?GFR: ?Estimated Creatinine Clearance: 12.3 mL/min (A) (by C-G formula based on SCr of 3.25 mg/dL (H)). ?Liver Function Tests: ?Recent Labs  ?Lab 07/13/21 ?0507  ?AST 80*  ?ALT 319*  ?ALKPHOS 140*  ?BILITOT 1.6*  ?PROT 6.7  ?ALBUMIN 2.5*  ? ? ?No results for input(s): LIPASE, AMYLASE in the last 168 hours. ? ?No results for input(s): AMMONIA in the last 168 hours. ?Coagulation Profile: ?No results for input(s): INR, PROTIME in the last 168 hours. ?Cardiac Enzymes: ?No results for input(s): CKTOTAL, CKMB, CKMBINDEX, TROPONINI in the last 168  hours. ?BNP (last 3 results) ?No results for input(s): PROBNP in the last 8760 hours. ?HbA1C: ?No results for input(s): HGBA1C in the last 72 hours. ?CBG: ?Recent Labs  ?Lab 07/18/21 ?1638 07/18/21 ?2123 07/19/21 ?0741 07/19/21 ?1226 07/19/21 ?1618  ?GLUCAP 362* 234* 189* 400* 458*  ? ? ?Lipid Profile: ?No results for input(s): CHOL, HDL, LDLCALC, TRIG, CHOLHDL, LDLDIRECT in the last 72 hours. ?Thyroid Function Tests: ?No results for input(s): TSH, T4TOTAL, FREET4, T3FREE, THYROIDAB in the last 72 hours. ?Anemia Panel: ?No results for input(s): VITAMINB12, FOLATE, FERRITIN, TIBC, IRON, RETICCTPCT in the last 72 hours. ?Sepsis Labs: ?No results for input(s): PROCALCITON, LATICACIDVEN in the last 168 hours. ? ?No results found for this or any previous visit (from the past 240 hour(s)).  ? ? ? ? ? ?Radiology Studies: ?No results found. ? ? ? ? ? ?Scheduled Meds: ? aspirin EC  81 mg Oral Daily  ? Chlorhexidine Gluconate Cloth  6 each Topical Daily  ? dextromethorphan  60 mg Oral BID  ? insulin aspart  0-5 Units Subcutaneous QHS  ? insulin aspart  0-9 Units Subcutaneous TID WC  ? insulin  glargine-yfgn  10 Units Subcutaneous Daily  ? levothyroxine  88 mcg Oral QAC breakfast  ? pantoprazole  40 mg Oral Daily  ? [START ON 07/20/2021] predniSONE  20 mg Oral Q breakfast  ? senna-docusate  2 tablet Oral QHS  ? sodium

## 2021-07-19 NOTE — Progress Notes (Signed)
Manufacturing engineer ([facility]) Hospital Liaison Note ? ?Referral received for patient/family interest in home with hospice. [place] liaison spoke with patient's daughter Anderson Malta to confirm interest. Interest confirmed, however they will be monitoring the patient overnight to see if she is stable to transport home. Anderson Malta asked that [facility] liaison follow up with her in the morning.  ? ?Please call with any questions or concerns. Thank you.. ? ?Roselee Nova, LCSW ?[facility] Liaison ?[phone] ?

## 2021-07-20 ENCOUNTER — Other Ambulatory Visit (HOSPITAL_COMMUNITY): Payer: Self-pay

## 2021-07-20 DIAGNOSIS — G4733 Obstructive sleep apnea (adult) (pediatric): Secondary | ICD-10-CM

## 2021-07-20 DIAGNOSIS — I10 Essential (primary) hypertension: Secondary | ICD-10-CM

## 2021-07-20 DIAGNOSIS — N179 Acute kidney failure, unspecified: Secondary | ICD-10-CM | POA: Diagnosis present

## 2021-07-20 DIAGNOSIS — I5023 Acute on chronic systolic (congestive) heart failure: Secondary | ICD-10-CM | POA: Diagnosis not present

## 2021-07-20 DIAGNOSIS — I4891 Unspecified atrial fibrillation: Secondary | ICD-10-CM

## 2021-07-20 DIAGNOSIS — E785 Hyperlipidemia, unspecified: Secondary | ICD-10-CM

## 2021-07-20 DIAGNOSIS — I82442 Acute embolism and thrombosis of left tibial vein: Secondary | ICD-10-CM

## 2021-07-20 DIAGNOSIS — E1169 Type 2 diabetes mellitus with other specified complication: Secondary | ICD-10-CM

## 2021-07-20 DIAGNOSIS — E039 Hypothyroidism, unspecified: Secondary | ICD-10-CM

## 2021-07-20 DIAGNOSIS — M109 Gout, unspecified: Secondary | ICD-10-CM | POA: Diagnosis not present

## 2021-07-20 DIAGNOSIS — I82409 Acute embolism and thrombosis of unspecified deep veins of unspecified lower extremity: Secondary | ICD-10-CM | POA: Diagnosis present

## 2021-07-20 LAB — CBC
HCT: 29 % — ABNORMAL LOW (ref 36.0–46.0)
Hemoglobin: 9.9 g/dL — ABNORMAL LOW (ref 12.0–15.0)
MCH: 28.7 pg (ref 26.0–34.0)
MCHC: 34.1 g/dL (ref 30.0–36.0)
MCV: 84.1 fL (ref 80.0–100.0)
Platelets: 232 10*3/uL (ref 150–400)
RBC: 3.45 MIL/uL — ABNORMAL LOW (ref 3.87–5.11)
RDW: 20.4 % — ABNORMAL HIGH (ref 11.5–15.5)
WBC: 9.5 10*3/uL (ref 4.0–10.5)
nRBC: 0.6 % — ABNORMAL HIGH (ref 0.0–0.2)

## 2021-07-20 LAB — COOXEMETRY PANEL
Carboxyhemoglobin: 1.4 % (ref 0.5–1.5)
Methemoglobin: 0.9 % (ref 0.0–1.5)
O2 Saturation: 54 %
Total hemoglobin: 10.3 g/dL — ABNORMAL LOW (ref 12.0–16.0)

## 2021-07-20 LAB — BASIC METABOLIC PANEL
Anion gap: 15 (ref 5–15)
BUN: 91 mg/dL — ABNORMAL HIGH (ref 8–23)
CO2: 22 mmol/L (ref 22–32)
Calcium: 8.7 mg/dL — ABNORMAL LOW (ref 8.9–10.3)
Chloride: 96 mmol/L — ABNORMAL LOW (ref 98–111)
Creatinine, Ser: 3.44 mg/dL — ABNORMAL HIGH (ref 0.44–1.00)
GFR, Estimated: 13 mL/min — ABNORMAL LOW (ref >60)
Glucose, Bld: 91 mg/dL (ref 70–99)
Potassium: 3.8 mmol/L (ref 3.5–5.1)
Sodium: 133 mmol/L — ABNORMAL LOW (ref 135–145)

## 2021-07-20 LAB — GLUCOSE, CAPILLARY: Glucose-Capillary: 88 mg/dL (ref 70–99)

## 2021-07-20 LAB — HEPARIN LEVEL (UNFRACTIONATED): Heparin Unfractionated: <0.1 IU/mL — ABNORMAL LOW (ref 0.30–0.70)

## 2021-07-20 MED ORDER — CLOPIDOGREL BISULFATE 75 MG PO TABS
75.0000 mg | ORAL_TABLET | Freq: Every day | ORAL | Status: DC
Start: 2021-07-20 — End: 2021-07-20

## 2021-07-20 MED ORDER — CLOPIDOGREL BISULFATE 75 MG PO TABS
75.0000 mg | ORAL_TABLET | Freq: Every day | ORAL | 0 refills | Status: AC
  Filled 2021-07-20: qty 30, 30d supply, fill #0

## 2021-07-20 MED ORDER — APIXABAN 5 MG PO TABS
5.0000 mg | ORAL_TABLET | Freq: Two times a day (BID) | ORAL | Status: DC
  Administered 2021-07-20: 5 mg via ORAL
  Filled 2021-07-20: qty 1

## 2021-07-20 MED ORDER — APIXABAN 5 MG PO TABS
5.0000 mg | ORAL_TABLET | Freq: Two times a day (BID) | ORAL | 0 refills | Status: AC
  Filled 2021-07-20: qty 60, 30d supply, fill #0

## 2021-07-20 MED ORDER — TORSEMIDE 20 MG PO TABS
60.0000 mg | ORAL_TABLET | Freq: Two times a day (BID) | ORAL | 0 refills | Status: AC
  Filled 2021-07-20: qty 180, 30d supply, fill #0

## 2021-07-20 NOTE — Hospital Course (Signed)
Karen West was admitted to the hospital with the working diagnosis of decompensated heart failure.  ? ?81 yo female with the past medical  history of coronary artery disease, hypertension, chronic kidney disease, T2DM, hypothyroid, heart failure and mitral regurgitation who presented with dyspnea and chest pain. Her symptoms were associated with abdominal pain, poor oral intake and  chills. On her initial physical examination her blood pressure was 105/95, HR 81, RR 30 Temp 97,8 and 02 saturation 93% on room air. Lungs with no rales or wheezing, heart with S1 and S2 present with no gallops, positive systolic murmur at the apex. Abdomen not distended, positive lower extremity edema.  ? ?NA 135, K 3,0. CL 101, bicarbonate 18, glucose 187, bun 79 cr 2,34  ?AST 138, ALT 519 T bil 1.5  ?Wbc 11,0 hgb 11.2 plt 202 ? ?Urine analysis SG 1,009  ? ?Chest radiograph with cardiomegaly and bilateral hilar vascular enlargement.  ? ?EKG 86 bpm, normal axis, right bundle branch block, sinus rhythm with PVCs, no significant ST segment or T wave changes.  ? ?Patient was placed on furosemide for diuresis.  ?Required milrinone for inotropic support.  ? ?Positive mitral regurgitation and not candidate for surgical intervention. ?Plan to continue care at home with hospice services.  ? ?  ?

## 2021-07-20 NOTE — Progress Notes (Signed)
[name] All      DOB: [birth year]      OFH:[id number] ? ? ? ?  ?Palliative Medicine Team ? ? ? ?Subjective: Bedside symptom check completed.  ? ? ?Physical exam: Patient sitting on side of bed discussing with bedside RN at time of visit.  ? ? ?Assessment and plan: Plan to discharge home today with [facility] hospice support in the home. Bedside RN Judson Roch without any needs or concerns at this time. Will continue to follow for any changes or advances.  ? ? ?Thank you for allowing the Palliative Medicine Team to assist in the care of this patient. ?  ?  ?Damian Leavell, MSN, RN ?Palliative Medicine Team ?Team Phone: [phone]  ?This phone is monitored 7a-7p, please reach out to attending physician outside of these hours for urgent needs.   ?

## 2021-07-20 NOTE — Assessment & Plan Note (Addendum)
Acute left lower extremity DVT.  ?Patient was initially placed on heparin for anticoagulation.  ?[place] with acute deep vein thrombosis involving a single paired posterior vein.  ? ?Placed on apixaban for anticoagulation.  ? ?

## 2021-07-20 NOTE — TOC Transition Note (Signed)
Transition of Care (TOC) - CM/SW Discharge Note ? ? ?Patient Details  ?Name: Karen West ?MRN: [id number] ?Date of Birth: 01/04/1941 ? ?Transition of Care (TOC) CM/SW Contact:  ?Graves-Bigelow, Ocie Cornfield, RN ?Phone Number: ?07/20/2021, 11:18 AM ? ? ?Clinical Narrative:  Case Manager discussed home with hospice services with the patient and family at the beside. Patient had questions regarding the hospice agency. Questions answered and verified services via [facility]. Case Manager made [facility] aware that the patient will transition from home health to hospice services. Patient is agreeable to services with [facility]. Patient reports that she rather go by private vehicle home. Patient declined any durable medical equipment at this time. Patient states she wants to use her bed and she has a bedside commode in the home. Daughter at the bedside and she will provide transportation home. No further needs from Case Manager at this time.  ? ? ? ?Final next level of care: Pink ?Barriers to Discharge: No Barriers Identified ? ? ?Patient Goals and CMS Choice ?Patient states their goals for this hospitalization and ongoing recovery are:: would lke to home if she can make is safe ?CMS Medicare.gov Compare Post Acute Care list provided to:: Patient Represenative (must comment) (dtr-Jennifer Jimmye Norman) ?Choice offered to / list presented to : Patient ? ?Discharge Plan and Services ?  ?Discharge Planning Services: CM Consult ?Post Acute Care Choice: Hospice          ?  ?  ?  ?  ?  ?HH Arranged: RN ?[place] [facility] ?Date HH Agency Contacted: 07/19/21 ?Time [place]: 3709 ?Representative spoke with at [place]: Farrel Gordon ? ?Readmission Risk Interventions ?   ? View : No data to display.  ?  ?  ?  ? ? ? ? ? ?

## 2021-07-20 NOTE — Assessment & Plan Note (Signed)
Continue home Cpap ?

## 2021-07-20 NOTE — Assessment & Plan Note (Addendum)
Patient was placed on insulin sliding scale for glucose cover and monitoring ?Her glucose remained well controlled.  ?Patient not on statin, poor prognosis.  ?

## 2021-07-20 NOTE — Progress Notes (Signed)
Dc note ? ?Patient alert and oriented , both patient and daughter verbalized understanding of instructions. All belongings and dc papers given to patient.Toc meds will be picked up from pharmacy on our way out.  ?

## 2021-07-20 NOTE — Assessment & Plan Note (Signed)
Left ankle acute gout arthritis.  ?Patient was placed on short course of steroids with improvement in her symptoms.  ?

## 2021-07-20 NOTE — Discharge Summary (Addendum)
Physician Discharge Summary   Patient: Karen West MRN: [id number] DOB: July 12, 1940  Admit date:     07/11/2021  Discharge date: 07/20/21  Discharge Physician: Coralie Keens   PCP: Merri Brunette, MD   Recommendations at discharge:    Patient is being discharge home with hospice services.  Resume hydralazine, isosorbide and metoprolol Will add anticoagulation for 30 days with apixaban.  Increased torsemide to 60 mg daily Follow up renal function and electrolytes as outpatient.   I spoke with patient's daughters at the bedside, we talked in detail about patient's condition, plan of care and prognosis and all questions were addressed.    Discharge Diagnoses: Principal Problem:   Acute on chronic systolic CHF (congestive heart failure) (HCC) Active Problems:   Type 2 diabetes mellitus with complication, without long-term current use of insulin (HCC)   Hyperlipidemia   OBSTRUCTIVE SLEEP APNEA   Essential hypertension   HLD (hyperlipidemia)   Chest pain  Resolved Problems:   * No resolved hospital problems. [facility] Course: Mrs. Karen West was admitted to the hospital with the working diagnosis of decompensated heart failure.   81 yo female with the past medical  history of coronary artery disease, hypertension, chronic kidney disease, T2DM, hypothyroid, heart failure and mitral regurgitation who presented with dyspnea and chest pain. Her symptoms were associated with abdominal pain, poor oral intake and  chills. On her initial physical examination her blood pressure was 105/95, HR 81, RR 30 Temp 97,8 and 02 saturation 93% on room air. Lungs with no rales or wheezing, heart with S1 and S2 present with no gallops, positive systolic murmur at the apex. Abdomen not distended, positive lower extremity edema.   NA 135, K 3,0. CL 101, bicarbonate 18, glucose 187, bun 79 cr 2,34  AST 138, ALT 519 T bil 1.5  Wbc 11,0 hgb 11.2 plt 202  Urine analysis SG 1,009   Chest  radiograph with cardiomegaly and bilateral hilar vascular enlargement.   EKG 86 bpm, normal axis, right bundle branch block, sinus rhythm with PVCs, no significant ST segment or T wave changes.   Patient was placed on furosemide for diuresis.  Required milrinone for inotropic support.   Positive mitral regurgitation and not candidate for surgical intervention. Plan to continue care at home with hospice services.      Assessment and Plan: * Acute on chronic systolic CHF (congestive heart failure) (HCC) Patient was admitted to the progressive care unit, was placed on furosemide for diuresis. Negative fluid balance was achieved -4,113 ml, with improvement in her symptoms.  Echocardiogram with reduced LV systolic function 35 to 40%, with posterolateral and basal to mid inferoseptal hypokinesis. Moderate to severe mitral regurgitation. Moderate to severe aortic valve regurgitation.   Elevated liver enzymes due to congestive hepatopathy related to heart failure decompensation.   Heart failure team and structural heart team Claris Gower) were consulted.  Patient not candidate for further invasive procedures, poor prognosis due to valvular heart disease.  Palliative care was consulted and patient will be discharge home whit hospice services.   Coronary artery disease, ischemic cardiomyopathy, Continue with aspirin and clopidogrel.   DVT (deep venous thrombosis) (HCC) Acute left lower extremity DVT.  Patient was initially placed on heparin for anticoagulation.  [place] with acute deep vein thrombosis involving a single paired posterior vein.   Placed on apixaban for anticoagulation.    Acute kidney injury superimposed on chronic kidney disease (HCC) CKD stage 4, hypokalemia, hyponatremia.   Worsening renal function despite  medical therapy. Patient will continue diuresis as palliative intervention.  At the time of her discharge her renal function had serum cr of 3,44, K 3,8 and serum  bicarbonate 22.  Na 133.   Essential hypertension Blood pressure at the time of discharge is stable. Plan to continue torsemide for diuresis.   Type 2 diabetes mellitus with hyperlipidemia (HCC) Patient was placed on insulin sliding scale for glucose cover and monitoring Her glucose remained well controlled.  Patient not on statin, poor prognosis.   OBSTRUCTIVE SLEEP APNEA Continue home Cpap.   Atrial fibrillation (HCC) New onset atrial fibrillation. Patient was placed on amiodarone for rate control.  Anticoagulation with apixaban.   Acute gout Left ankle acute gout arthritis.  Patient was placed on short course of steroids with improvement in her symptoms.   Hypothyroidism Continue with levothyroxine.          Consultants: cardiology  Procedures performed:  none   Disposition: Home Diet recommendation:  Cardiac diet DISCHARGE MEDICATION: Allergies as of 07/20/2021       Reactions   Tramadol Nausea And Vomiting, Other (See Comments)   "got sick to my stomach and was throwing up blood; it put me in the hospital")   Lisinopril Rash, Other (See Comments)   Renal failure   Rosuvastatin Other (See Comments)   Other reaction(s): rhabdo   Tapazole [methimazole] Itching, Rash        Medication List     STOP taking these medications    aspirin EC 81 MG tablet   empagliflozin 10 MG Tabs tablet Commonly known as: Jardiance   Klor-Con M10 10 MEQ tablet Generic drug: potassium chloride   Praluent 150 MG/ML Soaj Generic drug: Alirocumab   ticagrelor 90 MG Tabs tablet Commonly known as: BRILINTA       TAKE these medications    apixaban 5 MG Tabs tablet Commonly known as: ELIQUIS Take 1 tablet (5 mg total) by mouth 2 (two) times daily.   clopidogrel 75 MG tablet Commonly known as: PLAVIX Take 1 tablet (75 mg total) by mouth daily.   ferrous sulfate 325 (65 FE) MG tablet Take 325 mg by mouth 2 (two) times daily.   Fish Oil 1200 MG Caps Take 1,200  mg by mouth 2 (two) times daily.   hydrALAZINE 25 MG tablet Commonly known as: APRESOLINE Take 1.5 tablets (37.5 mg total) by mouth every 8 (eight) hours.   hydrocortisone cream 1 % Apply 1 application topically 2 (two) times daily as needed for itching.   isosorbide mononitrate 60 MG 24 hr tablet Commonly known as: IMDUR Take 1 tablet (60 mg total) by mouth daily.   levothyroxine 88 MCG tablet Commonly known as: SYNTHROID Take 88 mcg by mouth daily before breakfast.   linagliptin 5 MG Tabs tablet Commonly known as: TRADJENTA Take 5 mg by mouth daily.   metoprolol succinate 25 MG 24 hr tablet Commonly known as: TOPROL-XL Take 0.5 tablets (12.5 mg total) by mouth at bedtime.   nitroGLYCERIN 0.4 MG SL tablet Commonly known as: NITROSTAT Place 1 tablet (0.4 mg total) under the tongue every 5 (five) minutes as needed for chest pain.   omeprazole 20 MG capsule Commonly known as: PRILOSEC Take 20 mg by mouth 2 (two) times daily.   torsemide 20 MG tablet Commonly known as: DEMADEX Take 3 tablets (60 mg total) by mouth 2 (two) times daily. What changed:  how much to take when to take this   Tresiba FlexTouch 100 UNIT/ML FlexTouch Pen  Generic drug: insulin degludec 14 Units.   Vitamin D3 50 MCG (2000 UT) Tabs Take 2,000 Units by mouth daily.        Follow-up Information     [facility] Follow up.   Specialty: Hospice and Palliative Medicine Why: [facility] RN will call to arrange appt Contact information: [address] [place] [address] [phone]                Discharge Exam: Filed Weights   07/18/21 0419 07/19/21 0440 07/20/21 0624  Weight: 64.5 kg 64.2 kg 63.7 kg   BP (!) 148/76 (BP Location: Right Leg)   Pulse 80   Temp 98.1 F (36.7 C) (Oral)   Resp 19   Ht 5\' 1"  (1.549 m)   Wt 63.7 kg   SpO2 98%   BMI 26.55 kg/m   Patient with no chest pain or dyspnea.  Neurology awake  and alert ENT with mild pallor Cardiovascular with S1 and S2 present, irregularly irregular with positive systolic murmur at the right sternal border Respiratory with mild rales at bases Abdomen not distended Trace non pitting lower extremity edema  Condition at discharge: stable  The results of significant diagnostics from this hospitalization (including imaging, microbiology, ancillary and laboratory) are listed below for reference.   Imaging Studies: CT Abdomen Pelvis Wo Contrast  Result Date: 06/29/2021 CLINICAL DATA:  Left-sided flank pain EXAM: CT ABDOMEN AND PELVIS WITHOUT CONTRAST TECHNIQUE: Multidetector CT imaging of the abdomen and pelvis was performed following the standard protocol without IV contrast. RADIATION DOSE REDUCTION: This exam was performed according to the departmental dose-optimization program which includes automated exposure control, adjustment of the mA and/or kV according to patient size and/or use of iterative reconstruction technique. COMPARISON:  09/19/2019, MRI from 11/11/2019, CT from 02/28/2017 FINDINGS: Lower chest: Small pleural effusions are noted bilaterally. Right lower lobe infiltrate is seen consistent with acute pneumonia. Hepatobiliary: No focal liver abnormality is seen. No gallstones, gallbladder wall thickening, or biliary dilatation. Pancreas: Pancreas again demonstrates multiple cystic lesion the most prominent of which lies in the uncinate process measuring up to 2.7 cm. This is stable when compare with the prior exam. Spleen: Normal in size without focal abnormality. Adrenals/Urinary Tract: Adrenal glands are within normal limits bilaterally. Kidneys are well visualized bilaterally. Multiple hyperdense and isodense lesions are noted which appears stable in appearance from the prior exam. No renal calculi are seen. No obstructive changes noted. The bladder is well distended. Stomach/Bowel: Diverticular change of the colon is noted. No diverticulitis is  seen. The appendix is within normal limits. Small bowel and stomach show no acute abnormality. Vascular/Lymphatic: Aortic atherosclerosis. No enlarged abdominal or pelvic lymph nodes. Reproductive: Calcified and noncalcified uterine fibroids are noted stable from previous exams dating back to 2018. No adnexal mass is seen. Other: Fat containing umbilical hernia is noted. No free fluid is seen. Musculoskeletal: Degenerative changes of lumbar spine are noted. Chronic compression deformity at L5 is seen. IMPRESSION: Focal infiltrate in the right lower lobe likely representing acute pneumonia. Stable cystic lesions within the pancreas. Given their stability from the prior exam from 2021 there felt to be benign in etiology. Multiple isodense and hyperdense lesions are noted within the kidneys bilaterally stable from the prior CT and MRI examination. No further follow-up is recommended. Multiple uterine fibroids. 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 Electronically Signed   By: Alcide Clever M.D.   On: 06/29/2021 23:54   DG Chest 2 View  Result Date: 07/11/2021 CLINICAL DATA:  Chest pain EXAM: CHEST - 2 VIEW COMPARISON:  Radiographs dated June 29, 2021 and CT chest dated January 31, 2021 FINDINGS: Cardiomegaly with atherosclerotic calcification of aortic arch. Postsurgical changes for prior left upper lobectomy with elevation of the left hemidiaphragm. Low lung volumes without evidence of focal consolidation. Chronic deformity of the left rib, consistent with old healed fracture. Multiple surgical clips along the right chest wall. 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 IMPRESSION: 1.  Stable cardiomegaly. 2. Low lung volumes with postsurgical changes of the left lung are unchanged. No evidence of focal consolidation or pleural effusion. Electronically Signed   By: Larose Hires D.O.   On: 07/11/2021 15:39   DG Ribs Unilateral W/Chest Left  Result Date: 06/29/2021 CLINICAL DATA:   Gradual onset, constant, sharp, left-sided rib pain that began last night. History of non-small-cell lung cancer status post left upper lobectomy. EXAM: LEFT RIBS AND CHEST - 3+ VIEW COMPARISON:  Chest two views 05/06/2021, 04/27/2021, and 12/22/2020; CT chest 01/31/2021 FINDINGS: There is again left-sided volume loss. There is homogeneous opacification of the inferior 50% of the left lung which is similar to 12/22/2020 and 05/06/2021 radiographs. There is again high-grade mitral calcification. Cardiac silhouette is at the upper limits of normal size. Moderate calcification seen within the aortic arch. Numerous right axillary surgical clips. Two left axillary surgical clips. No definite pleural effusion or pneumothorax. Moderate multilevel degenerative disc changes of the thoracic spine. No significant change in angulation of the posterolateral left seventh rib, a healed fracture. Possible remote adjacent nondisplaced posterolateral left sixth rib fracture. IMPRESSION:: IMPRESSION: 1. Postsurgical changes of left upper lobectomy with associated volume loss. 2. Old healed posterolateral left seventh rib fracture is unchanged. Possible remote adjacent nondisplaced posterolateral left sixth rib fracture. No definite acute displaced rib fracture. Electronically Signed   By: Neita Garnet M.D.   On: 06/29/2021 18:59   ECHOCARDIOGRAM COMPLETE  Result Date: 07/14/2021    ECHOCARDIOGRAM REPORT   Patient Name:   EARLDEAN LOBECK Date of Exam: 07/13/2021 Medical Rec #:  [id number]        Height:       61.0 in Accession #:    [id number]       Weight:       145.3 lb Date of Birth:  12/05/1940         BSA:          1.649 m Patient Age:    80 years         BP:           101/57 mmHg Patient Gender: F                HR:           77 bpm. Exam Location:  Inpatient Procedure: 2D Echo, 3D Echo, Cardiac Doppler and Color Doppler Indications:    I50.20* Unspecified systolic (congestive) heart failure  History:        Patient has prior  history of Echocardiogram examinations, most                 recent 05/16/2021. CHF, CAD and Previous Myocardial Infarction,                 Abnormal ECG, Mitral Valve Disease, Signs/Symptoms:Chest Pain;                 Risk Factors:Diabetes, Sleep Apnea, Dyslipidemia and  Hypertension. Cancer. Mitral regurgitation.  Sonographer:    Sheralyn Boatman RDCS Referring Phys: 49 Roxy Horseman SIMMONS  Sonographer Comments: Technically difficult study due to poor echo windows. Image acquisition challenging due to mastectomy. Patient in high fowler's position due to dyspnea and heart rate. IMPRESSIONS  1. Posterolateral and basal to mid inferoseptal hypokinesis. Inferior akinesis. Left ventricular ejection fraction, by estimation, is 35 to 40%. The left ventricle has moderately decreased function. The left ventricle demonstrates regional wall motion abnormalities (see scoring diagram/findings for description). There is mild asymmetric left ventricular hypertrophy of the infero-lateral segment. Left ventricular diastolic parameters are consistent with Grade III diastolic dysfunction (restrictive). Elevated left ventricular end-diastolic pressure.  2. Right ventricular systolic function is normal. The right ventricular size is normal. There is moderately elevated pulmonary artery systolic pressure.  3. Left atrial size was moderately dilated.  4. Mitral valve regurgitant volume 59 mL. The mitral valve is degenerative. Moderate to severe mitral valve regurgitation. No evidence of mitral stenosis. Severe mitral annular calcification.  5. Tricuspid valve regurgitation is moderate.  6. Left coronary cusp is essentially fused. The aortic valve is normal in structure. There is moderate calcification of the aortic valve. There is moderate thickening of the aortic valve. Aortic valve regurgitation is moderate to severe. No aortic stenosis is present.  7. The inferior vena cava is dilated in size with <50% respiratory  variability, suggesting right atrial pressure of 15 mmHg. FINDINGS  Left Ventricle: Posterolateral and basal to mid inferoseptal hypokinesis. Inferior akinesis. Left ventricular ejection fraction, by estimation, is 35 to 40%. The left ventricle has moderately decreased function. The left ventricle demonstrates regional wall motion abnormalities. The left ventricular internal cavity size was normal in size. There is mild asymmetric left ventricular hypertrophy of the infero-lateral segment. Left ventricular diastolic parameters are consistent with Grade III diastolic dysfunction (restrictive). Elevated left ventricular end-diastolic pressure. Right Ventricle: The right ventricular size is normal. No increase in right ventricular wall thickness. Right ventricular systolic function is normal. There is moderately elevated pulmonary artery systolic pressure. The tricuspid regurgitant velocity is 3.25 m/s, and with an assumed right atrial pressure of 15 mmHg, the estimated right ventricular systolic pressure is 57.2 mmHg. Left Atrium: Left atrial size was moderately dilated. Right Atrium: Right atrial size was normal in size. Pericardium: There is no evidence of pericardial effusion. Mitral Valve: Mitral valve regurgitant volume 59 mL. The mitral valve is degenerative in appearance. Severe mitral annular calcification. Moderate to severe mitral valve regurgitation. No evidence of mitral valve stenosis. MV peak gradient, 11.0 mmHg. The mean mitral valve gradient is 2.0 mmHg. Tricuspid Valve: The tricuspid valve is normal in structure. Tricuspid valve regurgitation is moderate . No evidence of tricuspid stenosis. Aortic Valve: Left coronary cusp is essentially fused. The aortic valve is normal in structure. There is moderate calcification of the aortic valve. There is moderate thickening of the aortic valve. Aortic valve regurgitation is moderate to severe. Aortic regurgitation PHT measures 292 msec. No aortic stenosis is  present. Aortic valve mean gradient measures 5.0 mmHg. Aortic valve peak gradient measures 8.8 mmHg. Aortic valve area, by VTI measures 1.59 cm. Pulmonic Valve: The pulmonic valve was normal in structure. Pulmonic valve regurgitation is mild to moderate. No evidence of pulmonic stenosis. Aorta: The aortic root is normal in size and structure. Venous: The inferior vena cava is dilated in size with less than 50% respiratory variability, suggesting right atrial pressure of 15 mmHg. IAS/Shunts: No atrial level shunt detected by color flow  Doppler.  LEFT VENTRICLE PLAX 2D LVIDd:         5.40 cm      Diastology LVIDs:         4.90 cm      LV e' medial:    5.50 cm/s LV PW:         1.30 cm      LV E/e' medial:  25.1 LV IVS:        0.90 cm      LV e' lateral:   6.49 cm/s LVOT diam:     1.90 cm      LV E/e' lateral: 21.3 LV SV:         38 LV SV Index:   23 LVOT Area:     2.84 cm                              3D Volume EF: LV Volumes (MOD)            3D EF:        33 % LV vol d, MOD A2C: 103.0 ml LV EDV:       111 ml LV vol d, MOD A4C: 76.0 ml  LV ESV:       74 ml LV vol s, MOD A2C: 58.6 ml  LV SV:        37 ml LV vol s, MOD A4C: 41.4 ml LV SV MOD A2C:     44.4 ml LV SV MOD A4C:     76.0 ml LV SV MOD BP:      38.5 ml RIGHT VENTRICLE            IVC RV S prime:     7.13 cm/s  IVC diam: 2.40 cm TAPSE (M-mode): 1.0 cm LEFT ATRIUM             Index        RIGHT ATRIUM           Index LA diam:        5.10 cm 3.09 cm/m   RA Area:     15.10 cm LA Vol (A2C):   75.9 ml 46.03 ml/m  RA Volume:   38.70 ml  23.47 ml/m LA Vol (A4C):   64.2 ml 38.94 ml/m LA Biplane Vol: 74.3 ml 45.06 ml/m  AORTIC VALVE                    PULMONIC VALVE AV Area (Vmax):    1.70 cm     PR End Diast Vel: 2.48 msec AV Area (Vmean):   1.53 cm AV Area (VTI):     1.59 cm AV Vmax:           148.00 cm/s AV Vmean:          98.200 cm/s AV VTI:            0.241 m AV Peak Grad:      8.8 mmHg AV Mean Grad:      5.0 mmHg LVOT Vmax:         88.70 cm/s LVOT Vmean:         53.000 cm/s LVOT VTI:          0.135 m LVOT/AV VTI ratio: 0.56 AI PHT:            292 msec  AORTA Ao Root diam: 2.70 cm Ao Asc diam:  3.30 cm MITRAL VALVE  TRICUSPID VALVE MV Area (PHT): 4.68 cm       TR Peak grad:   42.2 mmHg MV Area VTI:   1.31 cm       TR Vmax:        325.00 cm/s MV Peak grad:  11.0 mmHg MV Mean grad:  2.0 mmHg       SHUNTS MV Vmax:       1.66 m/s       Systemic VTI:  0.14 m MV Vmean:      61.3 cm/s      Systemic Diam: 1.90 cm MV Decel Time: 162 msec MR Peak grad:    82.3 mmHg MR Mean grad:    52.0 mmHg MR Vmax:         453.50 cm/s MR Vmean:        340.5 cm/s MR PISA:         5.67 cm MR PISA Eff ROA: 48 mm MR PISA Radius:  0.95 cm MV E velocity: 138.00 cm/s Chilton Si MD Electronically signed by Chilton Si MD Signature Date/Time: 07/14/2021/8:07:20 AM    Final    VAS [place] LOWER EXTREMITY VENOUS (DVT)  Result Date: 07/12/2021  Lower Venous DVT Study Patient Name:  TAWNYA PIERRO  Date of Exam:   07/12/2021 Medical Rec #: [id number]         Accession #:    [id number] Date of Birth: 10/12/1940          Patient Gender: F Patient Age:   42 years Exam Location[facility]  [facility] Procedure:      VAS [place] LOWER EXTREMITY VENOUS (DVT) Referring Phys: Enid Derry HALL --------------------------------------------------------------------------------  Indications: Left leg swelling.  Comparison Study: No prior studies. Performing Technologist: Jean Rosenthal RDMS, RVT  Examination Guidelines: A complete evaluation includes B-mode imaging, spectral Doppler, color Doppler, and power Doppler as needed of all accessible portions of each vessel. Bilateral testing is considered an integral part of a complete examination. Limited examinations for reoccurring indications may be performed as noted. The reflux portion of the exam is performed with the patient in reverse Trendelenburg.  +-----+---------------+---------+-----------+----------+--------------+  RIGHTCompressibilityPhasicitySpontaneityPropertiesThrombus Aging +-----+---------------+---------+-----------+----------+--------------+ CFV  Full           Yes      Yes                                 +-----+---------------+---------+-----------+----------+--------------+   +---------+---------------+---------+-----------+----------+--------------+ LEFT     CompressibilityPhasicitySpontaneityPropertiesThrombus Aging +---------+---------------+---------+-----------+----------+--------------+ CFV      Full           Yes      Yes                                 +---------+---------------+---------+-----------+----------+--------------+ SFJ      Full                                                        +---------+---------------+---------+-----------+----------+--------------+ FV Prox  Full                                                        +---------+---------------+---------+-----------+----------+--------------+  FV Mid   Full                                                        +---------+---------------+---------+-----------+----------+--------------+ FV DistalFull                                                        +---------+---------------+---------+-----------+----------+--------------+ PFV      Full                                                        +---------+---------------+---------+-----------+----------+--------------+ POP      Full           Yes      Yes                                 +---------+---------------+---------+-----------+----------+--------------+ PTV      None           No       No                   Acute          +---------+---------------+---------+-----------+----------+--------------+ PERO     Full                                                        +---------+---------------+---------+-----------+----------+--------------+     Summary: RIGHT: - No evidence of common femoral vein  obstruction.  LEFT: - Findings consistent with acute deep vein thrombosis involving a single paired posterior tibial vein. - No cystic structure found in the popliteal fossa.  *See table(s) above for measurements and observations. Electronically signed by Lemar Livings MD on 07/12/2021 at 4:00:10 PM.    Final    [place] EKG SITE RITE  Result Date: 07/12/2021 If Site Rite image not attached, placement could not be confirmed due to current cardiac rhythm.  CT CHEST ABDOMEN PELVIS WO CONTRAST  Result Date: 07/12/2021 CLINICAL DATA:  Chest/abdominal pain.  History of lung cancer. EXAM: CT CHEST, ABDOMEN AND PELVIS WITHOUT CONTRAST TECHNIQUE: Multidetector CT imaging of the chest, abdomen and pelvis was performed following the standard protocol without IV contrast. RADIATION DOSE REDUCTION: This exam was performed according to the departmental dose-optimization program which includes automated exposure control, adjustment of the mA and/or kV according to patient size and/or use of iterative reconstruction technique. COMPARISON:  CT abdomen/pelvis dated 06/29/2021. CT chest dated 01/31/2021. FINDINGS: CT CHEST FINDINGS Cardiovascular: Mild cardiomegaly.  Small pericardial effusion. No evidence of thoracic aortic aneurysm. Atherosclerotic calcifications of the arch. Three vessel coronary atherosclerosis. Mitral valve annular calcifications. Mediastinum/Nodes: No suspicious mediastinal lymphadenopathy. Status post thyroidectomy. Lungs/Pleura: Patchy right lower lobe opacity, similar to the prior, atelectasis versus pneumonia. Status post left upper lobectomy. Mild centrilobular and paraseptal emphysematous changes,  right upper lobe predominant. 6 mm right upper lobe nodule (series 5/image 31), possibly infectious given the right lower lobe process, although this is new from prior chest CT and warrants follow-up in the setting of prior cancer. Small right and loculated trace left pleural effusions, unchanged. No  pneumothorax. Musculoskeletal: Degenerative changes of the thoracic spine. CT ABDOMEN PELVIS FINDINGS Hepatobiliary: Unenhanced liver is unremarkable. Gallbladder is unremarkable. No intrahepatic or extrahepatic ductal dilatation. Pancreas: Dominant 2.7 cm cystic lesion along the pancreatic uncinate process (series 3/image 61), unchanged. Spleen: Within normal limits. Adrenals/Urinary Tract: Adrenal glands are within normal limits. Multiple cystic and hemorrhagic bilateral renal lesions, measuring up to 4.4 cm in the anterior left lower kidney (series 3/image 85), poorly evaluated on unenhanced CT but unchanged. No renal calculi or hydronephrosis. Bladder is within normal limits. Stomach/Bowel: Stomach is within normal limits. No evidence of bowel obstruction. Normal appendix (series 3/image 76). Left colonic diverticulosis, without evidence of diverticulitis. Vascular/Lymphatic: No evidence of abdominal aortic aneurysm. Atherosclerotic calcifications of the abdominal aorta and branch vessels. No suspicious abdominopelvic lymphadenopathy. Reproductive: Calcified uterine fibroids. Bilateral ovaries are unremarkable. Other: No abdominopelvic ascites. Small fat containing periumbilical hernia (series 3/image 93). Musculoskeletal: Degenerative changes of the lumbar spine. Mild anterior wedging at L5, unchanged. IMPRESSION: Patchy right lower lobe opacity, unchanged from recent prior, atelectasis versus pneumonia. Small bilateral pleural effusions, right greater than left, unchanged. Status post left upper lobectomy. 6 mm right upper lobe nodule, new from 2022, possibly infectious but warranting follow-up. Consider follow-up CT chest in 3-6 months. No acute findings in the abdomen/pelvis to account for the patient's abdominal pain. Stable chronic ancillary findings as above. Electronically Signed   By: Charline Bills M.D.   On: 07/12/2021 00:15   US Abdomen Limited RUQ (LIVER/GB)  Result Date: 07/11/2021 CLINICAL  DATA:  Elevated LFTs EXAM: ULTRASOUND ABDOMEN LIMITED RIGHT UPPER QUADRANT COMPARISON:  None. FINDINGS: Gallbladder: 3 mm gallstone. No gallbladder wall thickening or pericholecystic fluid. Negative sonographic Murphy's sign. Common bile duct: Diameter: 3 mm Liver: At the upper limits of normal for parenchymal echogenicity. No focal hepatic lesion is seen. Portal vein is patent on color Doppler imaging with normal direction of blood flow towards the liver. Other: None. IMPRESSION: Tiny layering gallstone, without associated inflammatory changes to suggest acute cholecystitis. Electronically Signed   By: Charline Bills M.D.   On: 07/11/2021 21:14    Microbiology: Results for orders placed or performed during the hospital encounter of 04/23/21  Resp Panel by RT-PCR (Flu A&B, Covid) Nasopharyngeal Swab     Status: None   Collection Time: 04/23/21  9:56 AM   Specimen: Nasopharyngeal Swab; Nasopharyngeal(NP) swabs in vial transport medium  Result Value Ref Range Status   SARS Coronavirus 2 by RT PCR NEGATIVE NEGATIVE Final    Comment: (NOTE) SARS-CoV-2 target nucleic acids are NOT DETECTED.  The SARS-CoV-2 RNA is generally detectable in upper respiratory specimens during the acute phase of infection. The lowest concentration of SARS-CoV-2 viral copies this assay can detect is 138 copies/mL. A negative result does not preclude SARS-Cov-2 infection and should not be used as the sole basis for treatment or other patient management decisions. A negative result may occur with  improper specimen collection/handling, submission of specimen other than nasopharyngeal swab, presence of viral mutation(s) within the areas targeted by this assay, and inadequate number of viral copies(<138 copies/mL). A negative result must be combined with clinical observations, patient history, and epidemiological information. The expected result is Negative.  Fact Sheet for  Patients:   BloggerCourse.com  Fact Sheet for Healthcare Providers:  SeriousBroker.it  This test is no t yet approved or cleared by the [place] FDA and  has been authorized for detection and/or diagnosis of SARS-CoV-2 by FDA under an Emergency Use Authorization (EUA). This EUA will remain  in effect (meaning this test can be used) for the duration of the COVID-19 declaration under Section 564(b)(1) of the Act, 21 U.S.C.section 360bbb-3(b)(1), unless the authorization is terminated  or revoked sooner.       Influenza A by PCR NEGATIVE NEGATIVE Final   Influenza B by PCR NEGATIVE NEGATIVE Final    Comment: (NOTE) The Xpert Xpress SARS-CoV-2/FLU/RSV plus assay is intended as an aid in the diagnosis of influenza from Nasopharyngeal swab specimens and should not be used as a sole basis for treatment. Nasal washings and aspirates are unacceptable for Xpert Xpress SARS-CoV-2/FLU/RSV testing.  Fact Sheet for Patients: BloggerCourse.com  Fact Sheet for Healthcare Providers: SeriousBroker.it  This test is not yet approved or cleared by the [place] FDA and has been authorized for detection and/or diagnosis of SARS-CoV-2 by FDA under an Emergency Use Authorization (EUA). This EUA will remain in effect (meaning this test can be used) for the duration of the COVID-19 declaration under Section 564(b)(1) of the Act, 21 U.S.C. section 360bbb-3(b)(1), unless the authorization is terminated or revoked.  Performed at [facility], [address] [place], [place] [address]   MRSA Next Gen by PCR, Nasal     Status: None   Collection Time: 04/23/21 12:28 PM   Specimen: Nasal Mucosa; Nasal Swab  Result Value Ref Range Status   MRSA by PCR Next Gen NOT DETECTED NOT DETECTED Final    Comment: (NOTE) The GeneXpert MRSA Assay (FDA approved for NASAL specimens only), is one component of a  comprehensive MRSA colonization surveillance program. It is not intended to diagnose MRSA infection nor to guide or monitor treatment for MRSA infections. Test performance is not FDA approved in patients less than 8 years old. Performed at [facility], [address], [place], [place] [address]     Labs: CBC: Recent Labs  Lab 07/16/21 0545 07/17/21 0446 07/18/21 0502 07/19/21 0339 07/20/21 0615  WBC 6.6 6.3 6.7 5.6 9.5  HGB 9.7* 10.0* 10.0* 9.4* 9.9*  HCT 28.6* 29.4* 29.8* 29.2* 29.0*  MCV 86.4 86.7 85.9 86.9 84.1  PLT 190 196 207 216 232   Basic Metabolic Panel: Recent Labs  Lab 07/16/21 0545 07/17/21 0446 07/18/21 0502 07/19/21 0339 07/20/21 0615  NA 127* 127* 129* 129* 133*  K 3.1* 4.7 4.0 4.0 3.8  CL 92* 94* 94* 93* 96*  CO2 19* 19* 21* 22 22  GLUCOSE 193* 194* 132* 203* 91  BUN 76* 74* 74* 79* 91*  CREATININE 2.49* 2.68* 2.62* 3.25* 3.44*  CALCIUM 7.9* 8.1* 8.3* 8.3* 8.7*   Liver Function Tests: No results for input(s): AST, ALT, ALKPHOS, BILITOT, PROT, ALBUMIN in the last 168 hours. CBG: Recent Labs  Lab 07/19/21 0741 07/19/21 1226 07/19/21 1618 07/19/21 2103 07/20/21 0732  GLUCAP 189* 400* 458* 312* 88    Discharge time spent: greater than 30 minutes.  Signed: Coralie Keens, MD [facility] 07/20/2021

## 2021-07-20 NOTE — Progress Notes (Addendum)
Manufacturing engineer [facility]) Hospital Liaison Note ? ?Referral received for patient/family interest in home with hospice. [place] liaison spoke with patient's daughter Anderson Malta. Interest confirmed.  ? ?Hospice eligibility confirmed.  ? ?Plan is to discharge home today via car.  ? ?DME in the home: none ? ?DME needs: none ? ?Please send patient home with comfort medications/prescriptions at discharge.  ? ?Please call with any questions or concerns. Thank you ? ?Roselee Nova, LCSW ?[place] [facility] Liaison ?[phone] ?

## 2021-07-20 NOTE — Assessment & Plan Note (Signed)
Blood pressure at the time of discharge is stable. ?Plan to continue torsemide for diuresis.  ?

## 2021-07-20 NOTE — Assessment & Plan Note (Signed)
Continue with levothyroxine  

## 2021-07-20 NOTE — Assessment & Plan Note (Addendum)
Patient was admitted to the progressive care unit, was placed on furosemide for diuresis. Negative fluid balance was achieved -4,113 ml, with improvement in her symptoms. ? ?Echocardiogram with reduced LV systolic function 35 to 07%, with posterolateral and basal to mid inferoseptal hypokinesis. Moderate to severe mitral regurgitation. Moderate to severe aortic valve regurgitation.  ? ?Elevated liver enzymes due to congestive hepatopathy related to heart failure decompensation.  ? ?Heart failure team and structural heart team [facility] Ash) were consulted.  ?Patient not candidate for further invasive procedures, poor prognosis due to valvular heart disease.  ?Palliative care was consulted and patient will be discharge home whit hospice services.  ? ?Coronary artery disease, ischemic cardiomyopathy, ?Continue with aspirin and clopidogrel.  ?

## 2021-07-20 NOTE — Assessment & Plan Note (Signed)
CKD stage 4, hypokalemia, hyponatremia.  ? ?Worsening renal function despite medical therapy. ?Patient will continue diuresis as palliative intervention.  ?At the time of her discharge her renal function had serum cr of 3,44, K 3,8 and serum bicarbonate 22.  ?Na 133.  ?

## 2021-07-20 NOTE — Assessment & Plan Note (Addendum)
New onset atrial fibrillation. ?Patient was placed on amiodarone for rate control.  ?Anticoagulation with apixaban.  ?

## 2021-07-21 NOTE — Telephone Encounter (Addendum)
Dr. Ali Lowe personally spoke with Dr. Bridgette Habermann. It was determined Karen West is not a candidate for intervention for her MR.  ?She will be followed by the CHF Team and has an appointment with them 07/28/2021.  ?

## 2021-07-22 DIAGNOSIS — J189 Pneumonia, unspecified organism: Secondary | ICD-10-CM | POA: Diagnosis not present

## 2021-07-22 DIAGNOSIS — I255 Ischemic cardiomyopathy: Secondary | ICD-10-CM | POA: Diagnosis not present

## 2021-07-22 DIAGNOSIS — E1122 Type 2 diabetes mellitus with diabetic chronic kidney disease: Secondary | ICD-10-CM | POA: Diagnosis not present

## 2021-07-22 DIAGNOSIS — Z794 Long term (current) use of insulin: Secondary | ICD-10-CM | POA: Diagnosis not present

## 2021-07-22 DIAGNOSIS — E039 Hypothyroidism, unspecified: Secondary | ICD-10-CM | POA: Diagnosis not present

## 2021-07-22 DIAGNOSIS — I5023 Acute on chronic systolic (congestive) heart failure: Secondary | ICD-10-CM | POA: Diagnosis not present

## 2021-07-22 DIAGNOSIS — I252 Old myocardial infarction: Secondary | ICD-10-CM | POA: Diagnosis not present

## 2021-07-22 DIAGNOSIS — N184 Chronic kidney disease, stage 4 (severe): Secondary | ICD-10-CM | POA: Diagnosis not present

## 2021-07-22 DIAGNOSIS — K219 Gastro-esophageal reflux disease without esophagitis: Secondary | ICD-10-CM | POA: Diagnosis not present

## 2021-07-22 DIAGNOSIS — I051 Rheumatic mitral insufficiency: Secondary | ICD-10-CM | POA: Diagnosis not present

## 2021-07-22 DIAGNOSIS — D631 Anemia in chronic kidney disease: Secondary | ICD-10-CM | POA: Diagnosis not present

## 2021-07-22 DIAGNOSIS — I2510 Atherosclerotic heart disease of native coronary artery without angina pectoris: Secondary | ICD-10-CM | POA: Diagnosis not present

## 2021-07-23 DIAGNOSIS — I255 Ischemic cardiomyopathy: Secondary | ICD-10-CM | POA: Diagnosis not present

## 2021-07-23 DIAGNOSIS — I2510 Atherosclerotic heart disease of native coronary artery without angina pectoris: Secondary | ICD-10-CM | POA: Diagnosis not present

## 2021-07-23 DIAGNOSIS — E1122 Type 2 diabetes mellitus with diabetic chronic kidney disease: Secondary | ICD-10-CM | POA: Diagnosis not present

## 2021-07-23 DIAGNOSIS — I051 Rheumatic mitral insufficiency: Secondary | ICD-10-CM | POA: Diagnosis not present

## 2021-07-23 DIAGNOSIS — I252 Old myocardial infarction: Secondary | ICD-10-CM | POA: Diagnosis not present

## 2021-07-23 DIAGNOSIS — I5023 Acute on chronic systolic (congestive) heart failure: Secondary | ICD-10-CM | POA: Diagnosis not present

## 2021-07-24 DIAGNOSIS — I255 Ischemic cardiomyopathy: Secondary | ICD-10-CM | POA: Diagnosis not present

## 2021-07-24 DIAGNOSIS — I252 Old myocardial infarction: Secondary | ICD-10-CM | POA: Diagnosis not present

## 2021-07-24 DIAGNOSIS — I2510 Atherosclerotic heart disease of native coronary artery without angina pectoris: Secondary | ICD-10-CM | POA: Diagnosis not present

## 2021-07-24 DIAGNOSIS — I051 Rheumatic mitral insufficiency: Secondary | ICD-10-CM | POA: Diagnosis not present

## 2021-07-24 DIAGNOSIS — I5023 Acute on chronic systolic (congestive) heart failure: Secondary | ICD-10-CM | POA: Diagnosis not present

## 2021-07-24 DIAGNOSIS — E1122 Type 2 diabetes mellitus with diabetic chronic kidney disease: Secondary | ICD-10-CM | POA: Diagnosis not present

## 2021-07-25 ENCOUNTER — Telehealth: Payer: Self-pay | Admitting: *Deleted

## 2021-07-25 DIAGNOSIS — I2510 Atherosclerotic heart disease of native coronary artery without angina pectoris: Secondary | ICD-10-CM | POA: Diagnosis not present

## 2021-07-25 DIAGNOSIS — I051 Rheumatic mitral insufficiency: Secondary | ICD-10-CM | POA: Diagnosis not present

## 2021-07-25 DIAGNOSIS — I252 Old myocardial infarction: Secondary | ICD-10-CM | POA: Diagnosis not present

## 2021-07-25 DIAGNOSIS — I255 Ischemic cardiomyopathy: Secondary | ICD-10-CM | POA: Diagnosis not present

## 2021-07-25 DIAGNOSIS — I5023 Acute on chronic systolic (congestive) heart failure: Secondary | ICD-10-CM | POA: Diagnosis not present

## 2021-07-25 DIAGNOSIS — E1122 Type 2 diabetes mellitus with diabetic chronic kidney disease: Secondary | ICD-10-CM | POA: Diagnosis not present

## 2021-07-25 NOTE — Telephone Encounter (Signed)
HF TOC CM received text message from pt's dtr about concerns of pain in pt leg. States she reached out to [facility] and they prescribed a medication that pt has allergy and she could not take medications. States they finally called her back with a Rx for pain medications. Dtr states pt is having a great deal of pain in her leg and she has called Hospice RN but has not received a call back. She is concerned, explained she can seek emergency medical attention on call her PCP for direction. Provided dtr with contact number to reach her PCP's office. TOC CM notified [facility] rep, Margaretmary Eddy. She states she will have office follow up. Jonnie Finner RN3 CCM, Heart Failure TOC CM [phone]  ? ?5/4/202 1043 am Received call from pt's dtr, Anderson Malta. States [facility] RN was scheduled to come today but has called and cancelled. Pt's dtr wanted to see if CM could reassign pt case to another provider if they could not have a Hospice RN come to do admission today. Contacted [facility] reps, Fabio Pierce and Olivia Mackie to see if they could provide a Therapist, sports. Received notification from [facility] that RN would be out on 5/5 for admission. Reported back to dtr and she states it was not acceptable and she requested another agency. [facility] and they will be able to have Hospice RN completed admission today. Notified [facility] and they stated they wanted to keep referral and would have admission RN come out today. Notified pt's dtr and cancelled referral with [facility]. [facility] will service referral. Jonnie Finner RN3 CCM, Heart Failure TOC CM [phone]  ?

## 2021-07-26 DIAGNOSIS — E1122 Type 2 diabetes mellitus with diabetic chronic kidney disease: Secondary | ICD-10-CM | POA: Diagnosis not present

## 2021-07-26 DIAGNOSIS — I051 Rheumatic mitral insufficiency: Secondary | ICD-10-CM | POA: Diagnosis not present

## 2021-07-26 DIAGNOSIS — I255 Ischemic cardiomyopathy: Secondary | ICD-10-CM | POA: Diagnosis not present

## 2021-07-26 DIAGNOSIS — I5023 Acute on chronic systolic (congestive) heart failure: Secondary | ICD-10-CM | POA: Diagnosis not present

## 2021-07-26 DIAGNOSIS — I252 Old myocardial infarction: Secondary | ICD-10-CM | POA: Diagnosis not present

## 2021-07-26 DIAGNOSIS — I2510 Atherosclerotic heart disease of native coronary artery without angina pectoris: Secondary | ICD-10-CM | POA: Diagnosis not present

## 2021-07-27 DIAGNOSIS — I051 Rheumatic mitral insufficiency: Secondary | ICD-10-CM | POA: Diagnosis not present

## 2021-07-27 DIAGNOSIS — I255 Ischemic cardiomyopathy: Secondary | ICD-10-CM | POA: Diagnosis not present

## 2021-07-27 DIAGNOSIS — I5023 Acute on chronic systolic (congestive) heart failure: Secondary | ICD-10-CM | POA: Diagnosis not present

## 2021-07-27 DIAGNOSIS — E1122 Type 2 diabetes mellitus with diabetic chronic kidney disease: Secondary | ICD-10-CM | POA: Diagnosis not present

## 2021-07-27 DIAGNOSIS — I2510 Atherosclerotic heart disease of native coronary artery without angina pectoris: Secondary | ICD-10-CM | POA: Diagnosis not present

## 2021-07-27 DIAGNOSIS — I252 Old myocardial infarction: Secondary | ICD-10-CM | POA: Diagnosis not present

## 2021-07-28 ENCOUNTER — Encounter (HOSPITAL_COMMUNITY): Payer: Medicare Other

## 2021-07-28 ENCOUNTER — Encounter (HOSPITAL_COMMUNITY): Payer: Self-pay

## 2021-07-28 DIAGNOSIS — I051 Rheumatic mitral insufficiency: Secondary | ICD-10-CM | POA: Diagnosis not present

## 2021-07-28 DIAGNOSIS — I2510 Atherosclerotic heart disease of native coronary artery without angina pectoris: Secondary | ICD-10-CM | POA: Diagnosis not present

## 2021-07-28 DIAGNOSIS — I5023 Acute on chronic systolic (congestive) heart failure: Secondary | ICD-10-CM | POA: Diagnosis not present

## 2021-07-28 DIAGNOSIS — E1122 Type 2 diabetes mellitus with diabetic chronic kidney disease: Secondary | ICD-10-CM | POA: Diagnosis not present

## 2021-07-28 DIAGNOSIS — I252 Old myocardial infarction: Secondary | ICD-10-CM | POA: Diagnosis not present

## 2021-07-28 DIAGNOSIS — I255 Ischemic cardiomyopathy: Secondary | ICD-10-CM | POA: Diagnosis not present

## 2021-07-29 DIAGNOSIS — I252 Old myocardial infarction: Secondary | ICD-10-CM | POA: Diagnosis not present

## 2021-07-29 DIAGNOSIS — I5023 Acute on chronic systolic (congestive) heart failure: Secondary | ICD-10-CM | POA: Diagnosis not present

## 2021-07-29 DIAGNOSIS — E1122 Type 2 diabetes mellitus with diabetic chronic kidney disease: Secondary | ICD-10-CM | POA: Diagnosis not present

## 2021-07-29 DIAGNOSIS — I2510 Atherosclerotic heart disease of native coronary artery without angina pectoris: Secondary | ICD-10-CM | POA: Diagnosis not present

## 2021-07-29 DIAGNOSIS — I255 Ischemic cardiomyopathy: Secondary | ICD-10-CM | POA: Diagnosis not present

## 2021-07-29 DIAGNOSIS — I051 Rheumatic mitral insufficiency: Secondary | ICD-10-CM | POA: Diagnosis not present

## 2021-08-01 DIAGNOSIS — I051 Rheumatic mitral insufficiency: Secondary | ICD-10-CM | POA: Diagnosis not present

## 2021-08-01 DIAGNOSIS — I2510 Atherosclerotic heart disease of native coronary artery without angina pectoris: Secondary | ICD-10-CM | POA: Diagnosis not present

## 2021-08-01 DIAGNOSIS — I5023 Acute on chronic systolic (congestive) heart failure: Secondary | ICD-10-CM | POA: Diagnosis not present

## 2021-08-01 DIAGNOSIS — I255 Ischemic cardiomyopathy: Secondary | ICD-10-CM | POA: Diagnosis not present

## 2021-08-01 DIAGNOSIS — I252 Old myocardial infarction: Secondary | ICD-10-CM | POA: Diagnosis not present

## 2021-08-01 DIAGNOSIS — E1122 Type 2 diabetes mellitus with diabetic chronic kidney disease: Secondary | ICD-10-CM | POA: Diagnosis not present

## 2021-08-02 DIAGNOSIS — I2510 Atherosclerotic heart disease of native coronary artery without angina pectoris: Secondary | ICD-10-CM | POA: Diagnosis not present

## 2021-08-02 DIAGNOSIS — I255 Ischemic cardiomyopathy: Secondary | ICD-10-CM | POA: Diagnosis not present

## 2021-08-02 DIAGNOSIS — I051 Rheumatic mitral insufficiency: Secondary | ICD-10-CM | POA: Diagnosis not present

## 2021-08-02 DIAGNOSIS — E1122 Type 2 diabetes mellitus with diabetic chronic kidney disease: Secondary | ICD-10-CM | POA: Diagnosis not present

## 2021-08-02 DIAGNOSIS — I5023 Acute on chronic systolic (congestive) heart failure: Secondary | ICD-10-CM | POA: Diagnosis not present

## 2021-08-02 DIAGNOSIS — I252 Old myocardial infarction: Secondary | ICD-10-CM | POA: Diagnosis not present

## 2021-08-03 ENCOUNTER — Other Ambulatory Visit (HOSPITAL_COMMUNITY): Payer: Self-pay

## 2021-08-03 DIAGNOSIS — E1122 Type 2 diabetes mellitus with diabetic chronic kidney disease: Secondary | ICD-10-CM | POA: Diagnosis not present

## 2021-08-03 DIAGNOSIS — I2510 Atherosclerotic heart disease of native coronary artery without angina pectoris: Secondary | ICD-10-CM | POA: Diagnosis not present

## 2021-08-03 DIAGNOSIS — I252 Old myocardial infarction: Secondary | ICD-10-CM | POA: Diagnosis not present

## 2021-08-03 DIAGNOSIS — I255 Ischemic cardiomyopathy: Secondary | ICD-10-CM | POA: Diagnosis not present

## 2021-08-03 DIAGNOSIS — I5023 Acute on chronic systolic (congestive) heart failure: Secondary | ICD-10-CM | POA: Diagnosis not present

## 2021-08-03 DIAGNOSIS — I051 Rheumatic mitral insufficiency: Secondary | ICD-10-CM | POA: Diagnosis not present

## 2021-08-04 ENCOUNTER — Other Ambulatory Visit (HOSPITAL_COMMUNITY): Payer: Self-pay

## 2021-08-04 ENCOUNTER — Telehealth (HOSPITAL_COMMUNITY): Payer: Self-pay

## 2021-08-04 DIAGNOSIS — I255 Ischemic cardiomyopathy: Secondary | ICD-10-CM | POA: Diagnosis not present

## 2021-08-04 DIAGNOSIS — E1122 Type 2 diabetes mellitus with diabetic chronic kidney disease: Secondary | ICD-10-CM | POA: Diagnosis not present

## 2021-08-04 DIAGNOSIS — I051 Rheumatic mitral insufficiency: Secondary | ICD-10-CM | POA: Diagnosis not present

## 2021-08-04 DIAGNOSIS — I2510 Atherosclerotic heart disease of native coronary artery without angina pectoris: Secondary | ICD-10-CM | POA: Diagnosis not present

## 2021-08-04 DIAGNOSIS — I252 Old myocardial infarction: Secondary | ICD-10-CM | POA: Diagnosis not present

## 2021-08-04 DIAGNOSIS — I5023 Acute on chronic systolic (congestive) heart failure: Secondary | ICD-10-CM | POA: Diagnosis not present

## 2021-08-04 NOTE — Telephone Encounter (Signed)
Pharmacy Transitions of Care Follow-up Telephone Call  Date of discharge: 07/20/21  Discharge Diagnosis: CHF exacerbation  How have you been since you were released from the hospital? Patient doing okay.    Medication changes made at discharge: START taking: clopidogrel (PLAVIX)  Eliquis (apixaban)  CHANGE how you take: torsemide (DEMADEX)  STOP taking: aspirin EC 81 MG tablet  empagliflozin 10 MG Tabs tablet (Jardiance)  Klor-Con M10 10 MEQ tablet (potassium chloride)  Praluent 150 MG/ML Soaj (Alirocumab)  ticagrelor 90 MG Tabs tablet (BRILINTA)   Medication changes verified by the patient? Yes  Medication Accessibility:  Home Pharmacy: [facility]   Was the patient provided with refills on discharged medications? No   Have all prescriptions been transferred from [facility] to home pharmacy? N/A   Is the patient able to afford medications? Yes Notable copays: $4/30 of clopidogrel; $0/30 days of Eliquis    Medication Review: APIXABAN (ELIQUIS)  Apixaban 5 mg PO BID.  - Discussed importance of taking medication around the same time everyday  - Reviewed potential DDIs with patient  - Advised patient of medications to avoid (NSAIDs, ASA)  - Educated that Tylenol (acetaminophen) will be the preferred analgesic to prevent risk of bleeding  - Emphasized importance of monitoring for signs and symptoms of bleeding (abnormal bruising, prolonged bleeding, nose bleeds, bleeding from gums, discolored urine, black tarry stools)  - Advised patient to alert all providers of anticoagulation therapy prior to starting a new medication or having a procedure   CLOPIDOGREL (PLAVIX) Clopidogrel 75 mg once daily.  - Reviewed potential DDIs with patient  - Advised patient of medications to avoid (NSAIDs, ASA)  - Educated that Tylenol (acetaminophen) will be the preferred analgesic to prevent risk of bleeding  - Emphasized importance of monitoring for signs and symptoms of bleeding  (abnormal bruising, prolonged bleeding, nose bleeds, bleeding from gums, discolored urine, black tarry stools)  - Advised patient to alert all providers of anticoagulation therapy prior to starting a new medication or having a procedure   Follow-up Appointments:  PCP Hospital f/u appt confirmed? Canceled f/u appt on 07/28/21 and on 08/28/21.  If their condition worsens, is the pt aware to call PCP or go to the Emergency Dept.? Yes  Final Patient Assessment: -Pt is doing well.  -Pt verbalized understanding of medication changes.

## 2021-08-05 DIAGNOSIS — I252 Old myocardial infarction: Secondary | ICD-10-CM | POA: Diagnosis not present

## 2021-08-05 DIAGNOSIS — I2510 Atherosclerotic heart disease of native coronary artery without angina pectoris: Secondary | ICD-10-CM | POA: Diagnosis not present

## 2021-08-05 DIAGNOSIS — I255 Ischemic cardiomyopathy: Secondary | ICD-10-CM | POA: Diagnosis not present

## 2021-08-05 DIAGNOSIS — I5023 Acute on chronic systolic (congestive) heart failure: Secondary | ICD-10-CM | POA: Diagnosis not present

## 2021-08-05 DIAGNOSIS — I051 Rheumatic mitral insufficiency: Secondary | ICD-10-CM | POA: Diagnosis not present

## 2021-08-05 DIAGNOSIS — E1122 Type 2 diabetes mellitus with diabetic chronic kidney disease: Secondary | ICD-10-CM | POA: Diagnosis not present

## 2021-08-08 DIAGNOSIS — I051 Rheumatic mitral insufficiency: Secondary | ICD-10-CM | POA: Diagnosis not present

## 2021-08-08 DIAGNOSIS — I252 Old myocardial infarction: Secondary | ICD-10-CM | POA: Diagnosis not present

## 2021-08-08 DIAGNOSIS — E1122 Type 2 diabetes mellitus with diabetic chronic kidney disease: Secondary | ICD-10-CM | POA: Diagnosis not present

## 2021-08-08 DIAGNOSIS — I255 Ischemic cardiomyopathy: Secondary | ICD-10-CM | POA: Diagnosis not present

## 2021-08-08 DIAGNOSIS — I2510 Atherosclerotic heart disease of native coronary artery without angina pectoris: Secondary | ICD-10-CM | POA: Diagnosis not present

## 2021-08-08 DIAGNOSIS — I5023 Acute on chronic systolic (congestive) heart failure: Secondary | ICD-10-CM | POA: Diagnosis not present

## 2021-08-10 DIAGNOSIS — I051 Rheumatic mitral insufficiency: Secondary | ICD-10-CM | POA: Diagnosis not present

## 2021-08-10 DIAGNOSIS — I5023 Acute on chronic systolic (congestive) heart failure: Secondary | ICD-10-CM | POA: Diagnosis not present

## 2021-08-10 DIAGNOSIS — I255 Ischemic cardiomyopathy: Secondary | ICD-10-CM | POA: Diagnosis not present

## 2021-08-10 DIAGNOSIS — I252 Old myocardial infarction: Secondary | ICD-10-CM | POA: Diagnosis not present

## 2021-08-10 DIAGNOSIS — E1122 Type 2 diabetes mellitus with diabetic chronic kidney disease: Secondary | ICD-10-CM | POA: Diagnosis not present

## 2021-08-10 DIAGNOSIS — I2510 Atherosclerotic heart disease of native coronary artery without angina pectoris: Secondary | ICD-10-CM | POA: Diagnosis not present

## 2021-08-12 DIAGNOSIS — I255 Ischemic cardiomyopathy: Secondary | ICD-10-CM | POA: Diagnosis not present

## 2021-08-12 DIAGNOSIS — I5023 Acute on chronic systolic (congestive) heart failure: Secondary | ICD-10-CM | POA: Diagnosis not present

## 2021-08-12 DIAGNOSIS — I2510 Atherosclerotic heart disease of native coronary artery without angina pectoris: Secondary | ICD-10-CM | POA: Diagnosis not present

## 2021-08-12 DIAGNOSIS — I051 Rheumatic mitral insufficiency: Secondary | ICD-10-CM | POA: Diagnosis not present

## 2021-08-12 DIAGNOSIS — I252 Old myocardial infarction: Secondary | ICD-10-CM | POA: Diagnosis not present

## 2021-08-12 DIAGNOSIS — E1122 Type 2 diabetes mellitus with diabetic chronic kidney disease: Secondary | ICD-10-CM | POA: Diagnosis not present

## 2021-08-15 DIAGNOSIS — E1122 Type 2 diabetes mellitus with diabetic chronic kidney disease: Secondary | ICD-10-CM | POA: Diagnosis not present

## 2021-08-15 DIAGNOSIS — I255 Ischemic cardiomyopathy: Secondary | ICD-10-CM | POA: Diagnosis not present

## 2021-08-15 DIAGNOSIS — I2510 Atherosclerotic heart disease of native coronary artery without angina pectoris: Secondary | ICD-10-CM | POA: Diagnosis not present

## 2021-08-15 DIAGNOSIS — I051 Rheumatic mitral insufficiency: Secondary | ICD-10-CM | POA: Diagnosis not present

## 2021-08-15 DIAGNOSIS — I5023 Acute on chronic systolic (congestive) heart failure: Secondary | ICD-10-CM | POA: Diagnosis not present

## 2021-08-15 DIAGNOSIS — I252 Old myocardial infarction: Secondary | ICD-10-CM | POA: Diagnosis not present

## 2021-08-16 ENCOUNTER — Other Ambulatory Visit (HOSPITAL_COMMUNITY): Payer: Self-pay

## 2021-08-16 DIAGNOSIS — I5023 Acute on chronic systolic (congestive) heart failure: Secondary | ICD-10-CM | POA: Diagnosis not present

## 2021-08-16 DIAGNOSIS — E1122 Type 2 diabetes mellitus with diabetic chronic kidney disease: Secondary | ICD-10-CM | POA: Diagnosis not present

## 2021-08-16 DIAGNOSIS — I051 Rheumatic mitral insufficiency: Secondary | ICD-10-CM | POA: Diagnosis not present

## 2021-08-16 DIAGNOSIS — I252 Old myocardial infarction: Secondary | ICD-10-CM | POA: Diagnosis not present

## 2021-08-16 DIAGNOSIS — I255 Ischemic cardiomyopathy: Secondary | ICD-10-CM | POA: Diagnosis not present

## 2021-08-16 DIAGNOSIS — I2510 Atherosclerotic heart disease of native coronary artery without angina pectoris: Secondary | ICD-10-CM | POA: Diagnosis not present

## 2021-08-17 ENCOUNTER — Telehealth: Payer: Self-pay | Admitting: Cardiology

## 2021-08-17 DIAGNOSIS — E1122 Type 2 diabetes mellitus with diabetic chronic kidney disease: Secondary | ICD-10-CM | POA: Diagnosis not present

## 2021-08-17 DIAGNOSIS — I5023 Acute on chronic systolic (congestive) heart failure: Secondary | ICD-10-CM | POA: Diagnosis not present

## 2021-08-17 DIAGNOSIS — I255 Ischemic cardiomyopathy: Secondary | ICD-10-CM | POA: Diagnosis not present

## 2021-08-17 DIAGNOSIS — I051 Rheumatic mitral insufficiency: Secondary | ICD-10-CM | POA: Diagnosis not present

## 2021-08-17 DIAGNOSIS — I252 Old myocardial infarction: Secondary | ICD-10-CM | POA: Diagnosis not present

## 2021-08-17 DIAGNOSIS — I2510 Atherosclerotic heart disease of native coronary artery without angina pectoris: Secondary | ICD-10-CM | POA: Diagnosis not present

## 2021-08-17 NOTE — Telephone Encounter (Signed)
Patient's daughter states the patient is in hospice care and will not need further appointments.

## 2021-08-18 ENCOUNTER — Encounter (HOSPITAL_COMMUNITY): Payer: Medicare Other | Admitting: Internal Medicine

## 2021-08-18 DIAGNOSIS — I2510 Atherosclerotic heart disease of native coronary artery without angina pectoris: Secondary | ICD-10-CM | POA: Diagnosis not present

## 2021-08-18 DIAGNOSIS — N184 Chronic kidney disease, stage 4 (severe): Secondary | ICD-10-CM | POA: Diagnosis not present

## 2021-08-18 DIAGNOSIS — Z794 Long term (current) use of insulin: Secondary | ICD-10-CM | POA: Diagnosis not present

## 2021-08-18 DIAGNOSIS — I252 Old myocardial infarction: Secondary | ICD-10-CM | POA: Diagnosis not present

## 2021-08-18 DIAGNOSIS — E1122 Type 2 diabetes mellitus with diabetic chronic kidney disease: Secondary | ICD-10-CM | POA: Diagnosis not present

## 2021-08-18 DIAGNOSIS — I051 Rheumatic mitral insufficiency: Secondary | ICD-10-CM | POA: Diagnosis not present

## 2021-08-18 DIAGNOSIS — K219 Gastro-esophageal reflux disease without esophagitis: Secondary | ICD-10-CM | POA: Diagnosis not present

## 2021-08-18 DIAGNOSIS — I255 Ischemic cardiomyopathy: Secondary | ICD-10-CM | POA: Diagnosis not present

## 2021-08-18 DIAGNOSIS — E039 Hypothyroidism, unspecified: Secondary | ICD-10-CM | POA: Diagnosis not present

## 2021-08-18 DIAGNOSIS — J189 Pneumonia, unspecified organism: Secondary | ICD-10-CM | POA: Diagnosis not present

## 2021-08-18 DIAGNOSIS — I5023 Acute on chronic systolic (congestive) heart failure: Secondary | ICD-10-CM | POA: Diagnosis not present

## 2021-08-18 DIAGNOSIS — D631 Anemia in chronic kidney disease: Secondary | ICD-10-CM | POA: Diagnosis not present

## 2021-08-19 DIAGNOSIS — I252 Old myocardial infarction: Secondary | ICD-10-CM | POA: Diagnosis not present

## 2021-08-19 DIAGNOSIS — I5023 Acute on chronic systolic (congestive) heart failure: Secondary | ICD-10-CM | POA: Diagnosis not present

## 2021-08-19 DIAGNOSIS — I051 Rheumatic mitral insufficiency: Secondary | ICD-10-CM | POA: Diagnosis not present

## 2021-08-19 DIAGNOSIS — I255 Ischemic cardiomyopathy: Secondary | ICD-10-CM | POA: Diagnosis not present

## 2021-08-19 DIAGNOSIS — E1122 Type 2 diabetes mellitus with diabetic chronic kidney disease: Secondary | ICD-10-CM | POA: Diagnosis not present

## 2021-08-19 DIAGNOSIS — I2510 Atherosclerotic heart disease of native coronary artery without angina pectoris: Secondary | ICD-10-CM | POA: Diagnosis not present

## 2021-08-22 DIAGNOSIS — I252 Old myocardial infarction: Secondary | ICD-10-CM | POA: Diagnosis not present

## 2021-08-22 DIAGNOSIS — I5023 Acute on chronic systolic (congestive) heart failure: Secondary | ICD-10-CM | POA: Diagnosis not present

## 2021-08-22 DIAGNOSIS — I051 Rheumatic mitral insufficiency: Secondary | ICD-10-CM | POA: Diagnosis not present

## 2021-08-22 DIAGNOSIS — I255 Ischemic cardiomyopathy: Secondary | ICD-10-CM | POA: Diagnosis not present

## 2021-08-22 DIAGNOSIS — I2510 Atherosclerotic heart disease of native coronary artery without angina pectoris: Secondary | ICD-10-CM | POA: Diagnosis not present

## 2021-08-22 DIAGNOSIS — E1122 Type 2 diabetes mellitus with diabetic chronic kidney disease: Secondary | ICD-10-CM | POA: Diagnosis not present

## 2021-08-23 DIAGNOSIS — I051 Rheumatic mitral insufficiency: Secondary | ICD-10-CM | POA: Diagnosis not present

## 2021-08-23 DIAGNOSIS — I5023 Acute on chronic systolic (congestive) heart failure: Secondary | ICD-10-CM | POA: Diagnosis not present

## 2021-08-23 DIAGNOSIS — I252 Old myocardial infarction: Secondary | ICD-10-CM | POA: Diagnosis not present

## 2021-08-23 DIAGNOSIS — E1122 Type 2 diabetes mellitus with diabetic chronic kidney disease: Secondary | ICD-10-CM | POA: Diagnosis not present

## 2021-08-23 DIAGNOSIS — I2510 Atherosclerotic heart disease of native coronary artery without angina pectoris: Secondary | ICD-10-CM | POA: Diagnosis not present

## 2021-08-23 DIAGNOSIS — I255 Ischemic cardiomyopathy: Secondary | ICD-10-CM | POA: Diagnosis not present

## 2021-08-24 DIAGNOSIS — I252 Old myocardial infarction: Secondary | ICD-10-CM | POA: Diagnosis not present

## 2021-08-24 DIAGNOSIS — I5023 Acute on chronic systolic (congestive) heart failure: Secondary | ICD-10-CM | POA: Diagnosis not present

## 2021-08-24 DIAGNOSIS — I2510 Atherosclerotic heart disease of native coronary artery without angina pectoris: Secondary | ICD-10-CM | POA: Diagnosis not present

## 2021-08-24 DIAGNOSIS — I051 Rheumatic mitral insufficiency: Secondary | ICD-10-CM | POA: Diagnosis not present

## 2021-08-24 DIAGNOSIS — I255 Ischemic cardiomyopathy: Secondary | ICD-10-CM | POA: Diagnosis not present

## 2021-08-24 DIAGNOSIS — E1122 Type 2 diabetes mellitus with diabetic chronic kidney disease: Secondary | ICD-10-CM | POA: Diagnosis not present

## 2021-08-26 DIAGNOSIS — E1122 Type 2 diabetes mellitus with diabetic chronic kidney disease: Secondary | ICD-10-CM | POA: Diagnosis not present

## 2021-08-26 DIAGNOSIS — I2510 Atherosclerotic heart disease of native coronary artery without angina pectoris: Secondary | ICD-10-CM | POA: Diagnosis not present

## 2021-08-26 DIAGNOSIS — I252 Old myocardial infarction: Secondary | ICD-10-CM | POA: Diagnosis not present

## 2021-08-26 DIAGNOSIS — I255 Ischemic cardiomyopathy: Secondary | ICD-10-CM | POA: Diagnosis not present

## 2021-08-26 DIAGNOSIS — I5023 Acute on chronic systolic (congestive) heart failure: Secondary | ICD-10-CM | POA: Diagnosis not present

## 2021-08-26 DIAGNOSIS — I051 Rheumatic mitral insufficiency: Secondary | ICD-10-CM | POA: Diagnosis not present

## 2021-08-29 DIAGNOSIS — I051 Rheumatic mitral insufficiency: Secondary | ICD-10-CM | POA: Diagnosis not present

## 2021-08-29 DIAGNOSIS — I2510 Atherosclerotic heart disease of native coronary artery without angina pectoris: Secondary | ICD-10-CM | POA: Diagnosis not present

## 2021-08-29 DIAGNOSIS — I5023 Acute on chronic systolic (congestive) heart failure: Secondary | ICD-10-CM | POA: Diagnosis not present

## 2021-08-29 DIAGNOSIS — I252 Old myocardial infarction: Secondary | ICD-10-CM | POA: Diagnosis not present

## 2021-08-29 DIAGNOSIS — I255 Ischemic cardiomyopathy: Secondary | ICD-10-CM | POA: Diagnosis not present

## 2021-08-29 DIAGNOSIS — E1122 Type 2 diabetes mellitus with diabetic chronic kidney disease: Secondary | ICD-10-CM | POA: Diagnosis not present

## 2021-08-30 DIAGNOSIS — I5023 Acute on chronic systolic (congestive) heart failure: Secondary | ICD-10-CM | POA: Diagnosis not present

## 2021-08-30 DIAGNOSIS — E1122 Type 2 diabetes mellitus with diabetic chronic kidney disease: Secondary | ICD-10-CM | POA: Diagnosis not present

## 2021-08-30 DIAGNOSIS — I252 Old myocardial infarction: Secondary | ICD-10-CM | POA: Diagnosis not present

## 2021-08-30 DIAGNOSIS — I255 Ischemic cardiomyopathy: Secondary | ICD-10-CM | POA: Diagnosis not present

## 2021-08-30 DIAGNOSIS — I051 Rheumatic mitral insufficiency: Secondary | ICD-10-CM | POA: Diagnosis not present

## 2021-08-30 DIAGNOSIS — I2510 Atherosclerotic heart disease of native coronary artery without angina pectoris: Secondary | ICD-10-CM | POA: Diagnosis not present

## 2021-08-31 DIAGNOSIS — I252 Old myocardial infarction: Secondary | ICD-10-CM | POA: Diagnosis not present

## 2021-08-31 DIAGNOSIS — I051 Rheumatic mitral insufficiency: Secondary | ICD-10-CM | POA: Diagnosis not present

## 2021-08-31 DIAGNOSIS — I2510 Atherosclerotic heart disease of native coronary artery without angina pectoris: Secondary | ICD-10-CM | POA: Diagnosis not present

## 2021-08-31 DIAGNOSIS — I5023 Acute on chronic systolic (congestive) heart failure: Secondary | ICD-10-CM | POA: Diagnosis not present

## 2021-08-31 DIAGNOSIS — I255 Ischemic cardiomyopathy: Secondary | ICD-10-CM | POA: Diagnosis not present

## 2021-08-31 DIAGNOSIS — E1122 Type 2 diabetes mellitus with diabetic chronic kidney disease: Secondary | ICD-10-CM | POA: Diagnosis not present

## 2021-09-02 DIAGNOSIS — I252 Old myocardial infarction: Secondary | ICD-10-CM | POA: Diagnosis not present

## 2021-09-02 DIAGNOSIS — I5023 Acute on chronic systolic (congestive) heart failure: Secondary | ICD-10-CM | POA: Diagnosis not present

## 2021-09-02 DIAGNOSIS — I255 Ischemic cardiomyopathy: Secondary | ICD-10-CM | POA: Diagnosis not present

## 2021-09-02 DIAGNOSIS — I2510 Atherosclerotic heart disease of native coronary artery without angina pectoris: Secondary | ICD-10-CM | POA: Diagnosis not present

## 2021-09-02 DIAGNOSIS — I051 Rheumatic mitral insufficiency: Secondary | ICD-10-CM | POA: Diagnosis not present

## 2021-09-02 DIAGNOSIS — E1122 Type 2 diabetes mellitus with diabetic chronic kidney disease: Secondary | ICD-10-CM | POA: Diagnosis not present

## 2021-09-04 DIAGNOSIS — I5023 Acute on chronic systolic (congestive) heart failure: Secondary | ICD-10-CM | POA: Diagnosis not present

## 2021-09-04 DIAGNOSIS — I255 Ischemic cardiomyopathy: Secondary | ICD-10-CM | POA: Diagnosis not present

## 2021-09-04 DIAGNOSIS — I051 Rheumatic mitral insufficiency: Secondary | ICD-10-CM | POA: Diagnosis not present

## 2021-09-04 DIAGNOSIS — E1122 Type 2 diabetes mellitus with diabetic chronic kidney disease: Secondary | ICD-10-CM | POA: Diagnosis not present

## 2021-09-04 DIAGNOSIS — I2510 Atherosclerotic heart disease of native coronary artery without angina pectoris: Secondary | ICD-10-CM | POA: Diagnosis not present

## 2021-09-04 DIAGNOSIS — I252 Old myocardial infarction: Secondary | ICD-10-CM | POA: Diagnosis not present

## 2021-09-05 DIAGNOSIS — I2510 Atherosclerotic heart disease of native coronary artery without angina pectoris: Secondary | ICD-10-CM | POA: Diagnosis not present

## 2021-09-05 DIAGNOSIS — E1122 Type 2 diabetes mellitus with diabetic chronic kidney disease: Secondary | ICD-10-CM | POA: Diagnosis not present

## 2021-09-05 DIAGNOSIS — I5023 Acute on chronic systolic (congestive) heart failure: Secondary | ICD-10-CM | POA: Diagnosis not present

## 2021-09-05 DIAGNOSIS — I252 Old myocardial infarction: Secondary | ICD-10-CM | POA: Diagnosis not present

## 2021-09-05 DIAGNOSIS — I051 Rheumatic mitral insufficiency: Secondary | ICD-10-CM | POA: Diagnosis not present

## 2021-09-05 DIAGNOSIS — I255 Ischemic cardiomyopathy: Secondary | ICD-10-CM | POA: Diagnosis not present

## 2021-09-06 DIAGNOSIS — I2510 Atherosclerotic heart disease of native coronary artery without angina pectoris: Secondary | ICD-10-CM | POA: Diagnosis not present

## 2021-09-06 DIAGNOSIS — I255 Ischemic cardiomyopathy: Secondary | ICD-10-CM | POA: Diagnosis not present

## 2021-09-06 DIAGNOSIS — I051 Rheumatic mitral insufficiency: Secondary | ICD-10-CM | POA: Diagnosis not present

## 2021-09-06 DIAGNOSIS — I5023 Acute on chronic systolic (congestive) heart failure: Secondary | ICD-10-CM | POA: Diagnosis not present

## 2021-09-06 DIAGNOSIS — E1122 Type 2 diabetes mellitus with diabetic chronic kidney disease: Secondary | ICD-10-CM | POA: Diagnosis not present

## 2021-09-06 DIAGNOSIS — I252 Old myocardial infarction: Secondary | ICD-10-CM | POA: Diagnosis not present

## 2021-09-07 DIAGNOSIS — I255 Ischemic cardiomyopathy: Secondary | ICD-10-CM | POA: Diagnosis not present

## 2021-09-07 DIAGNOSIS — I2510 Atherosclerotic heart disease of native coronary artery without angina pectoris: Secondary | ICD-10-CM | POA: Diagnosis not present

## 2021-09-07 DIAGNOSIS — E1122 Type 2 diabetes mellitus with diabetic chronic kidney disease: Secondary | ICD-10-CM | POA: Diagnosis not present

## 2021-09-07 DIAGNOSIS — I252 Old myocardial infarction: Secondary | ICD-10-CM | POA: Diagnosis not present

## 2021-09-07 DIAGNOSIS — I5023 Acute on chronic systolic (congestive) heart failure: Secondary | ICD-10-CM | POA: Diagnosis not present

## 2021-09-07 DIAGNOSIS — I051 Rheumatic mitral insufficiency: Secondary | ICD-10-CM | POA: Diagnosis not present

## 2021-09-08 DIAGNOSIS — I255 Ischemic cardiomyopathy: Secondary | ICD-10-CM | POA: Diagnosis not present

## 2021-09-08 DIAGNOSIS — I5023 Acute on chronic systolic (congestive) heart failure: Secondary | ICD-10-CM | POA: Diagnosis not present

## 2021-09-08 DIAGNOSIS — I252 Old myocardial infarction: Secondary | ICD-10-CM | POA: Diagnosis not present

## 2021-09-08 DIAGNOSIS — E1122 Type 2 diabetes mellitus with diabetic chronic kidney disease: Secondary | ICD-10-CM | POA: Diagnosis not present

## 2021-09-08 DIAGNOSIS — I051 Rheumatic mitral insufficiency: Secondary | ICD-10-CM | POA: Diagnosis not present

## 2021-09-08 DIAGNOSIS — I2510 Atherosclerotic heart disease of native coronary artery without angina pectoris: Secondary | ICD-10-CM | POA: Diagnosis not present

## 2021-09-09 DIAGNOSIS — I2510 Atherosclerotic heart disease of native coronary artery without angina pectoris: Secondary | ICD-10-CM | POA: Diagnosis not present

## 2021-09-09 DIAGNOSIS — E1122 Type 2 diabetes mellitus with diabetic chronic kidney disease: Secondary | ICD-10-CM | POA: Diagnosis not present

## 2021-09-09 DIAGNOSIS — I5023 Acute on chronic systolic (congestive) heart failure: Secondary | ICD-10-CM | POA: Diagnosis not present

## 2021-09-09 DIAGNOSIS — I051 Rheumatic mitral insufficiency: Secondary | ICD-10-CM | POA: Diagnosis not present

## 2021-09-09 DIAGNOSIS — I252 Old myocardial infarction: Secondary | ICD-10-CM | POA: Diagnosis not present

## 2021-09-09 DIAGNOSIS — I255 Ischemic cardiomyopathy: Secondary | ICD-10-CM | POA: Diagnosis not present

## 2021-09-10 DIAGNOSIS — I255 Ischemic cardiomyopathy: Secondary | ICD-10-CM | POA: Diagnosis not present

## 2021-09-10 DIAGNOSIS — E1122 Type 2 diabetes mellitus with diabetic chronic kidney disease: Secondary | ICD-10-CM | POA: Diagnosis not present

## 2021-09-10 DIAGNOSIS — I5023 Acute on chronic systolic (congestive) heart failure: Secondary | ICD-10-CM | POA: Diagnosis not present

## 2021-09-10 DIAGNOSIS — I2510 Atherosclerotic heart disease of native coronary artery without angina pectoris: Secondary | ICD-10-CM | POA: Diagnosis not present

## 2021-09-10 DIAGNOSIS — I252 Old myocardial infarction: Secondary | ICD-10-CM | POA: Diagnosis not present

## 2021-09-10 DIAGNOSIS — I051 Rheumatic mitral insufficiency: Secondary | ICD-10-CM | POA: Diagnosis not present

## 2021-09-11 DIAGNOSIS — I2510 Atherosclerotic heart disease of native coronary artery without angina pectoris: Secondary | ICD-10-CM | POA: Diagnosis not present

## 2021-09-11 DIAGNOSIS — I051 Rheumatic mitral insufficiency: Secondary | ICD-10-CM | POA: Diagnosis not present

## 2021-09-11 DIAGNOSIS — I5023 Acute on chronic systolic (congestive) heart failure: Secondary | ICD-10-CM | POA: Diagnosis not present

## 2021-09-11 DIAGNOSIS — I255 Ischemic cardiomyopathy: Secondary | ICD-10-CM | POA: Diagnosis not present

## 2021-09-11 DIAGNOSIS — I252 Old myocardial infarction: Secondary | ICD-10-CM | POA: Diagnosis not present

## 2021-09-11 DIAGNOSIS — E1122 Type 2 diabetes mellitus with diabetic chronic kidney disease: Secondary | ICD-10-CM | POA: Diagnosis not present

## 2021-09-17 DEATH — deceased

## 2021-10-10 ENCOUNTER — Ambulatory Visit: Payer: Medicare Other | Admitting: Podiatry

## 2021-11-15 ENCOUNTER — Other Ambulatory Visit (HOSPITAL_COMMUNITY): Payer: Self-pay | Admitting: Adult Health

## 2022-01-30 ENCOUNTER — Other Ambulatory Visit: Payer: Medicare Other

## 2022-01-30 ENCOUNTER — Ambulatory Visit (HOSPITAL_COMMUNITY): Payer: Medicare Other

## 2022-02-02 ENCOUNTER — Ambulatory Visit: Payer: Medicare Other | Admitting: Internal Medicine

## 2022-10-08 IMAGING — CT CT CHEST W/O CM
3 of 5 series · 16 of 36 positions shown, 18 images · non-contrast
Comparison: 07/28/2019.

CLINICAL DATA: Restaging non-small cell lung cancer. Remote history
of breast cancer status post left mastectomy and right lumpectomy.

EXAM:
CT CHEST WITHOUT CONTRAST
TECHNIQUE: Multidetector CT imaging of the chest was performed following the
standard protocol without IV contrast.

[Series 2: thorax · axial · 0.65mm/px · z∈[+135,+343]mm · 8 of 134 slices shown, 10 images]
[im 15/134  mediastinal]
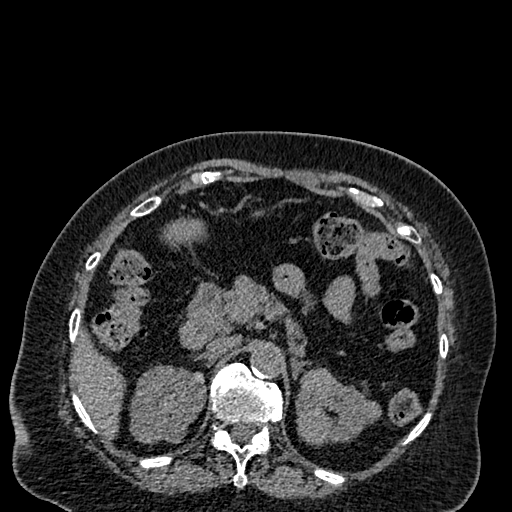
[im 15/134  lung]
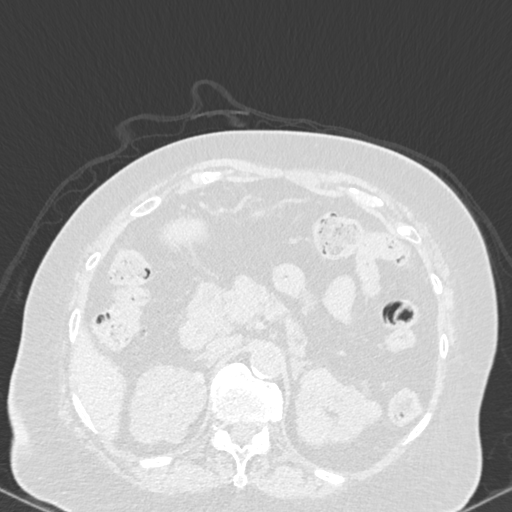
[im 30/134  lung]
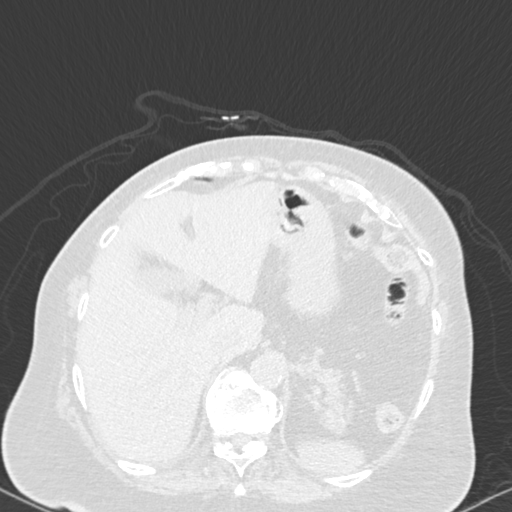
[im 45/134  lung]
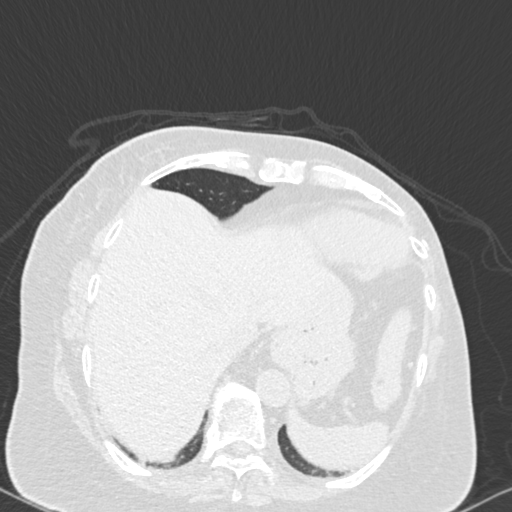
[im 60/134  lung]
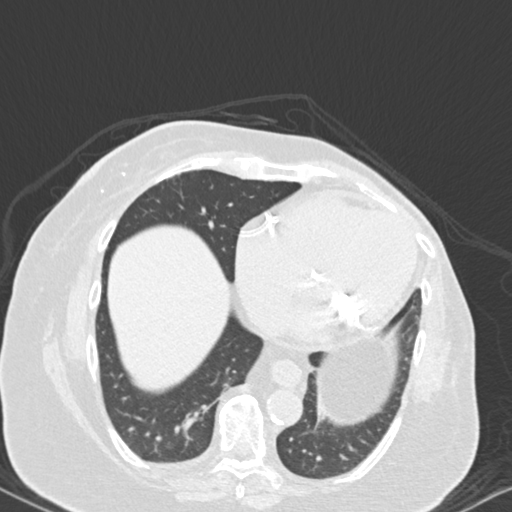
[im 74/134  mediastinal]
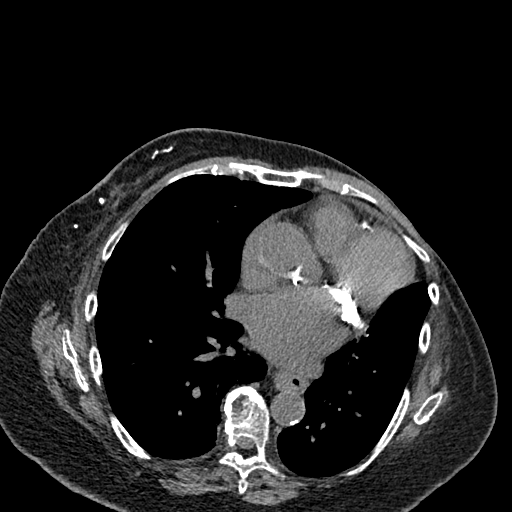
[im 74/134  lung]
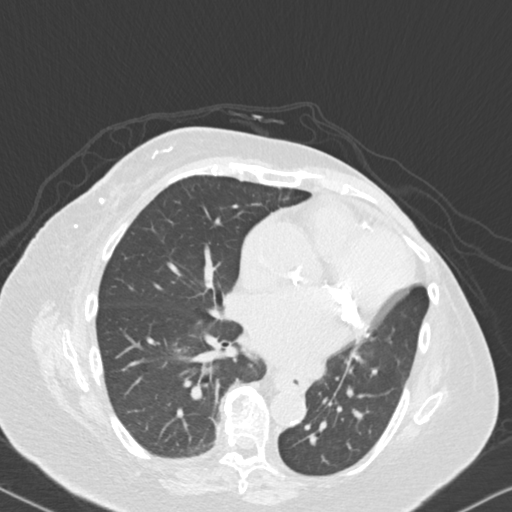
[im 89/134  lung]
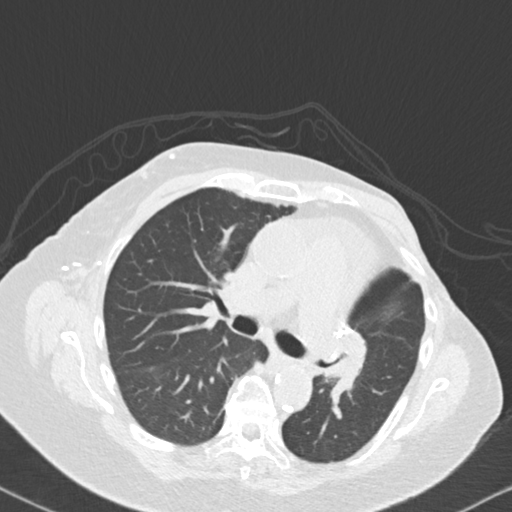
[im 104/134  lung]
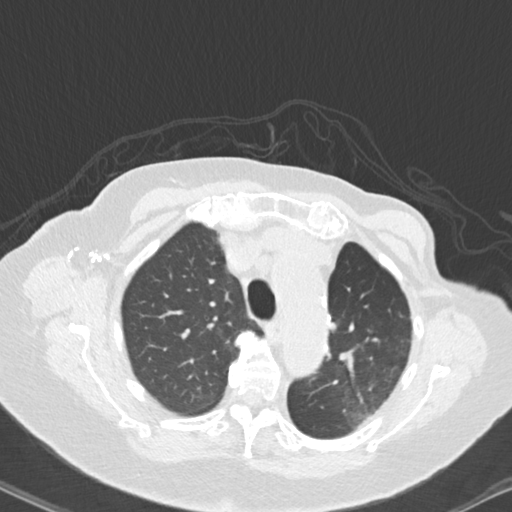
[im 119/134  lung]
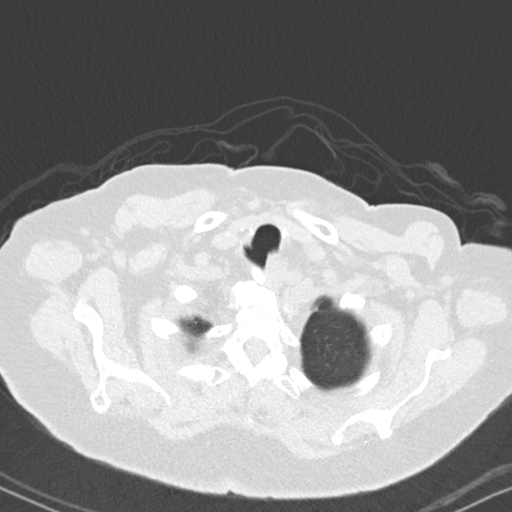

[Series 5: lungs · axial · 0.65mm/px · z∈[+139,+273]mm · 5 of 134 slices shown]
[im 17/134  lung]
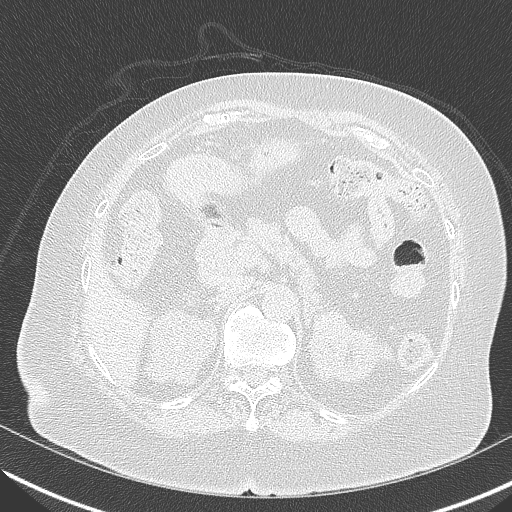
[im 34/134  lung]
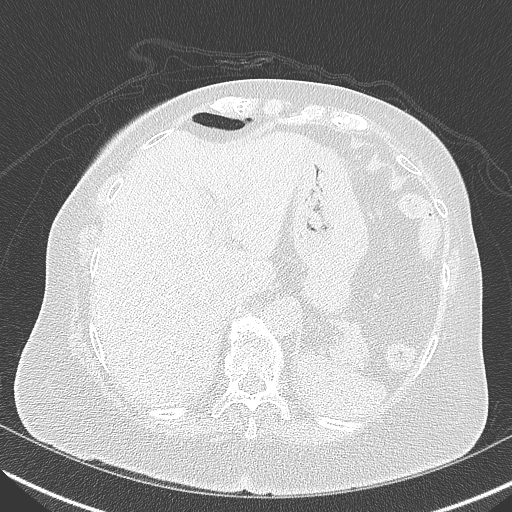
[im 50/134  lung]
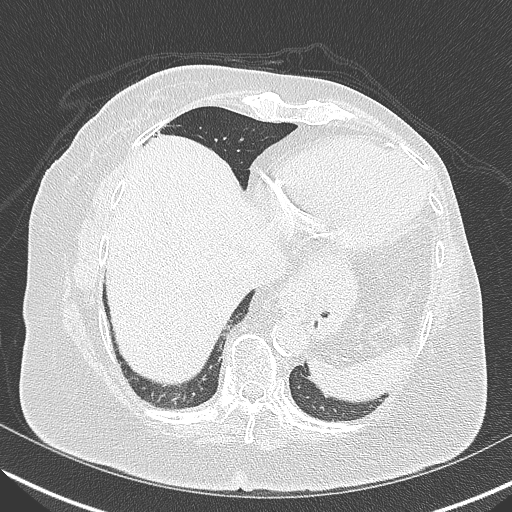
[im 67/134  lung]
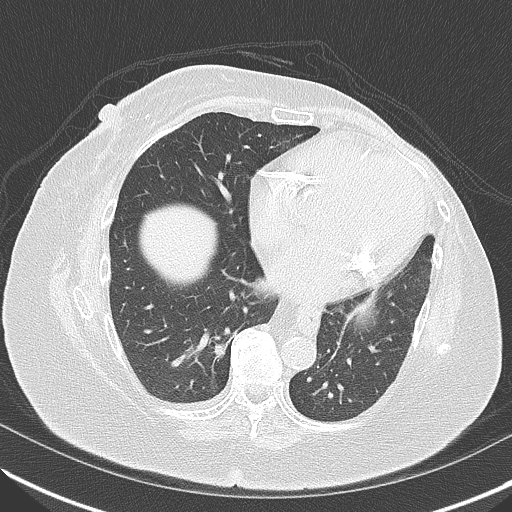
[im 84/134  lung]
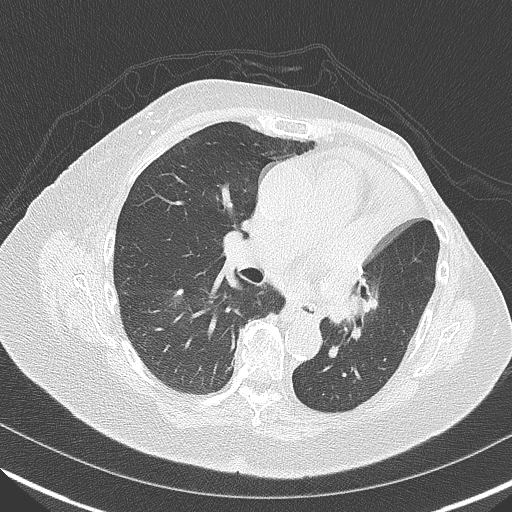

[Series 6: coronal · coronal · 0.59mm/px · 3 of 144 slices shown]
[im 29/144  lung]
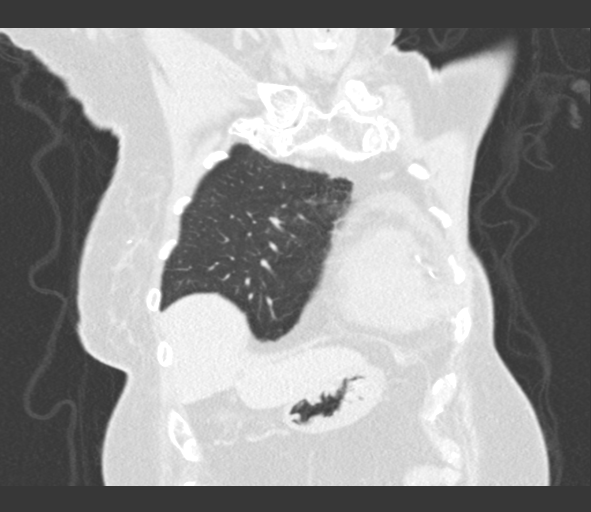
[im 58/144  lung]
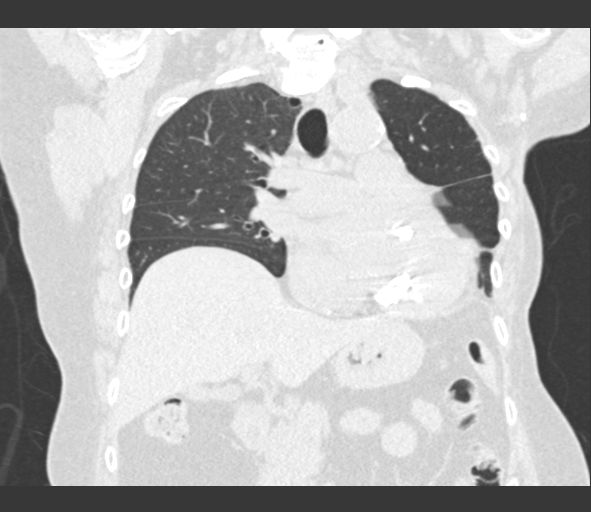
[im 86/144  lung]
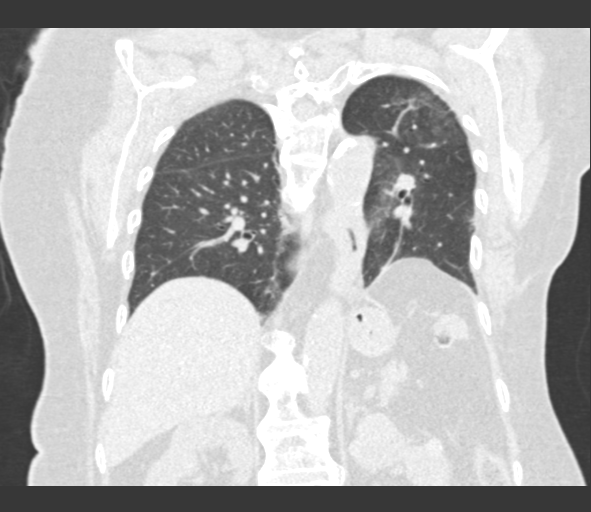

[16 of 36 positions shown; findings below may reference images not displayed]

FINDINGS: Cardiovascular: Heart size is within normal limits. Small amount of
pericardial fluid is again noted anteriorly. Calcifications of the
aortic valve and mitral annulus. Aortic atherosclerosis. Coronary
artery calcifications.

Mediastinum/Nodes: Thyroidectomy. The trachea appears patent and is
midline. Normal appearance of the esophagus. Bilateral axillary node
dissection. No enlarged axillary, supraclavicular, or mediastinal
adenopathy. The hilar lymph nodes are suboptimally evaluated due to
lack of IV contrast.

Lungs/Pleura: Status post left upper lobectomy. Regional area of
ground-glass attenuation within the left upper lung is new from
previous exam, nonspecific, image [DATE]. Calcified granuloma along
the superior aspect of the right major fissure. No suspicious lung
nodule.

Upper Abdomen: No acute findings within the imaged portions of the
upper abdomen. Bilateral lobular appearing kidneys with several the
hyperdense left kidney lesions which are incompletely characterized
without IV contrast. Similar to the previous exam is the 3.3 cm
hyperdense lesion arising off the medial cortex of the upper pole of
left kidney, image 126/2. Unchanged in size from previous exam.
Additionally, there is a stable hyperdense lesion arising from the
anterior cortex of the upper pole of left kidney measuring 1 cm,
image 124/2. Also incompletely characterized without IV contrast.

Musculoskeletal: No chest wall mass or suspicious bone lesions
identified. Postoperative changes from left mastectomy.
IMPRESSION: 1. Stable CT of the chest status post left upper lobectomy. No
specific findings identified to suggest residual or recurrence of
tumor or metastatic disease.
2. Within the left upper lobe there is a new geographic area of
ground-glass attenuation which is nonspecific but favored to
represent sequelae of interval inflammation or infection.
3. There are 2 hyperdense lesions arising from the upper pole of
left kidney. Although stable compared with previous exam these are
incompletely characterized without IV contrast.
4. Aortic atherosclerosis and coronary artery calcifications.

Aortic Atherosclerosis ([id number]-[id number]).

## 2023-04-14 IMAGING — CT CT CHEST W/O CM
2 of 4 series · 15 of 36 positions shown, 18 images · non-contrast
Comparison: CT chest, 07/27/2020, MR abdomen, 10/26/2020

CLINICAL DATA: Non-small lung cancer restaging, status post left
upper lobectomy

EXAM:
CT CHEST WITHOUT CONTRAST
TECHNIQUE: Multidetector CT imaging of the chest was performed following the
standard protocol without IV contrast.

[Series 2: thorax · axial · 0.64mm/px · z∈[-227,-11]mm · 12 of 126 slices shown, 15 images]
[im 9/126  mediastinal]
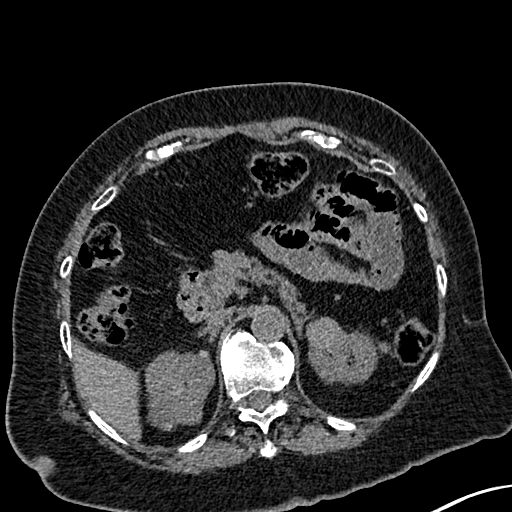
[im 9/126  lung]
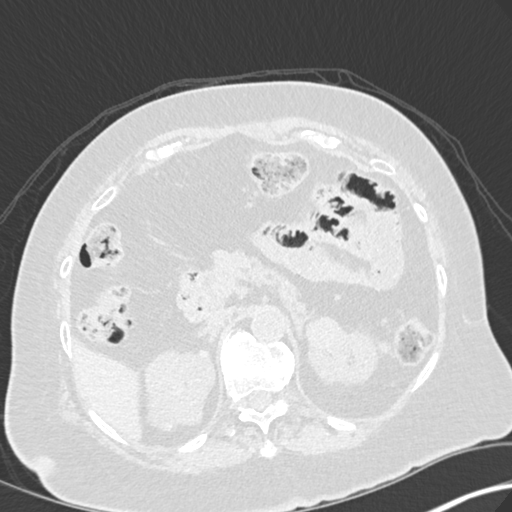
[im 18/126  lung]
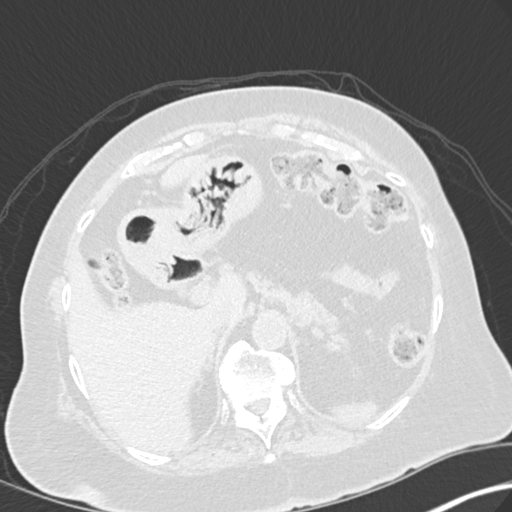
[im 27/126  lung]
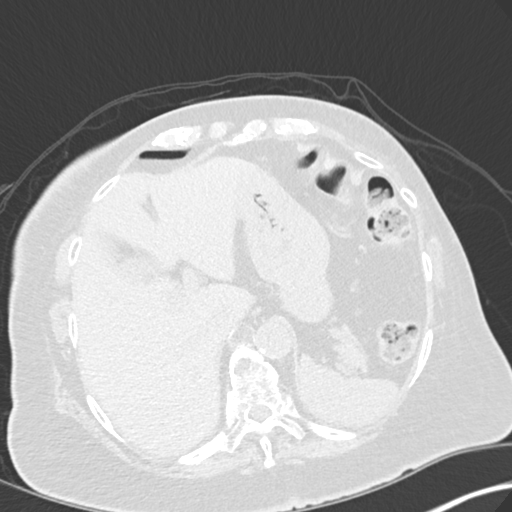
[im 36/126  lung]
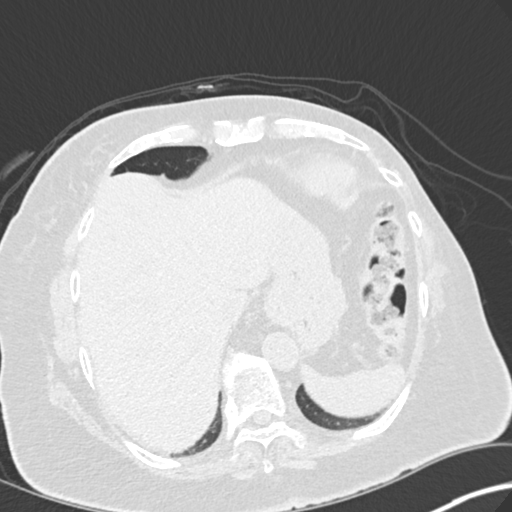
[im 45/126  mediastinal]
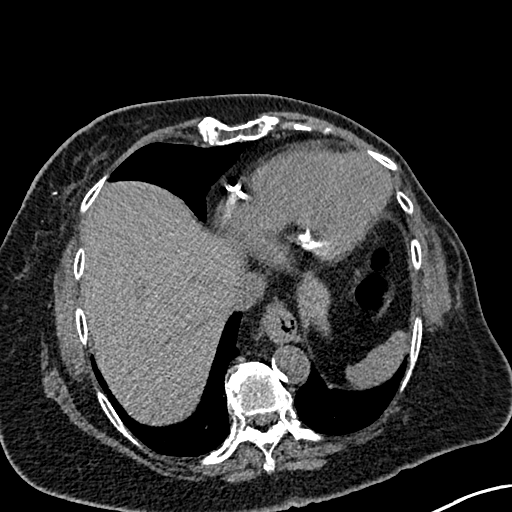
[im 45/126  lung]
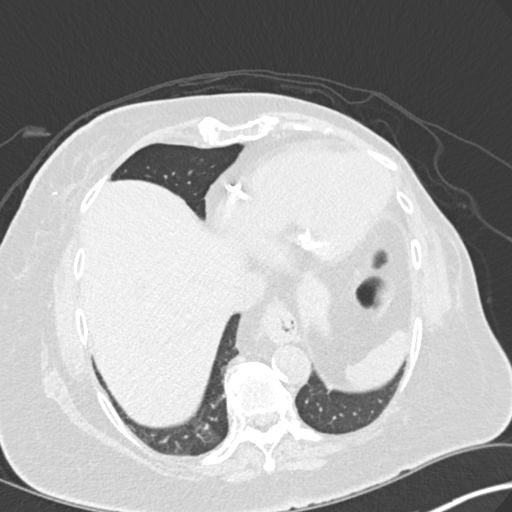
[im 54/126  lung]
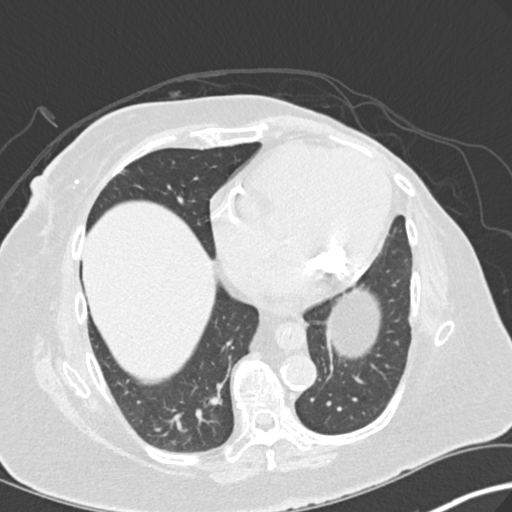
[im 72/126  lung]
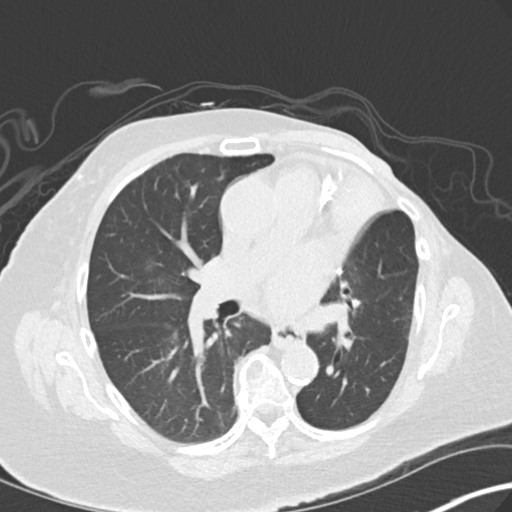
[im 81/126  lung]
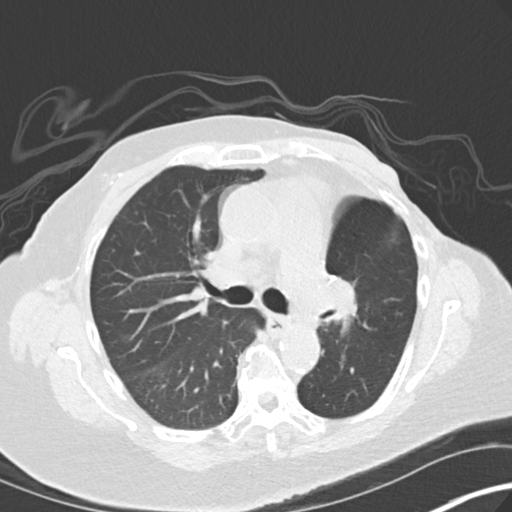
[im 90/126  mediastinal]
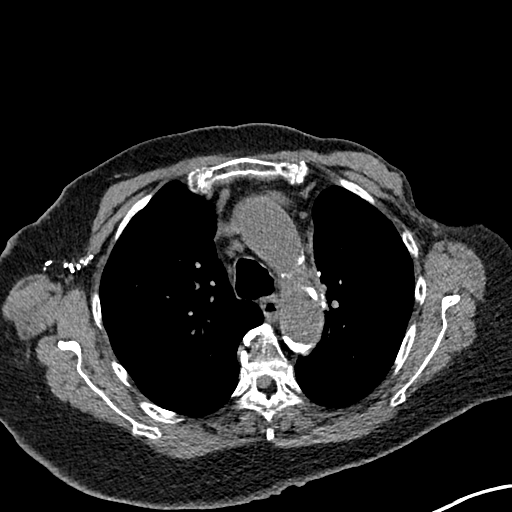
[im 90/126  lung]
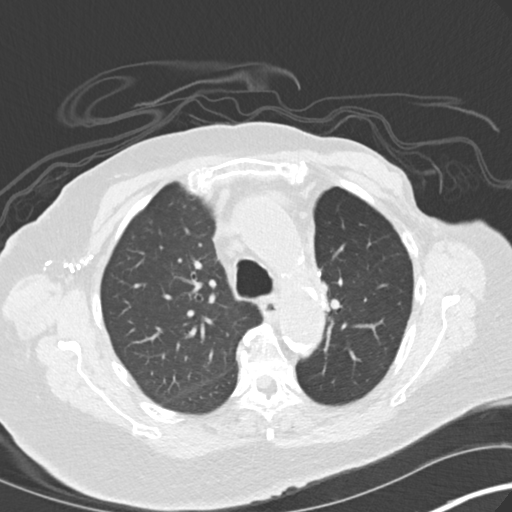
[im 99/126  lung]
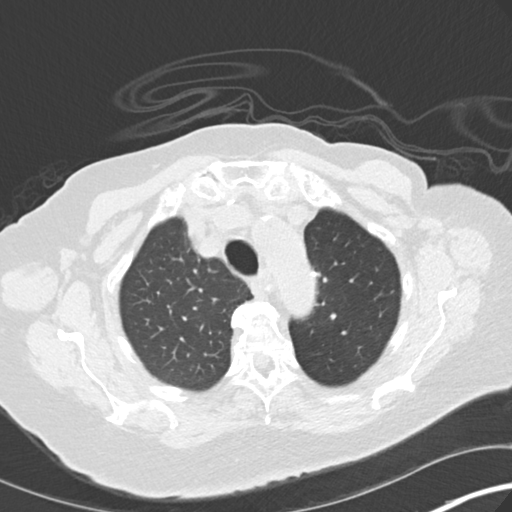
[im 108/126  lung]
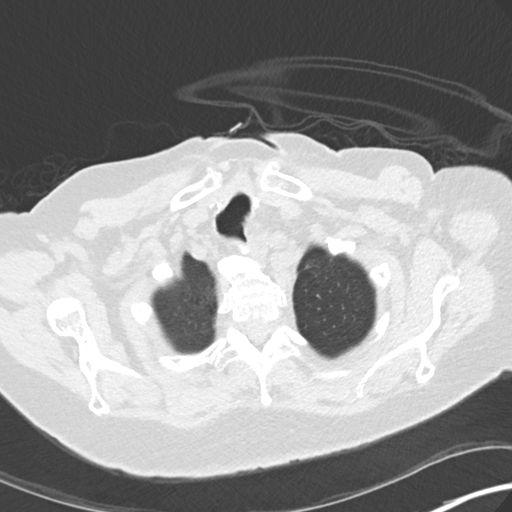
[im 117/126  lung]
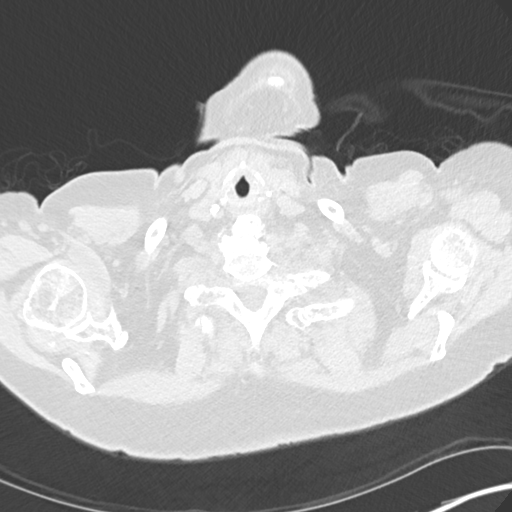

[Series 5: coronal · coronal · 0.53mm/px · 3 of 142 slices shown]
[im 29/142  lung]
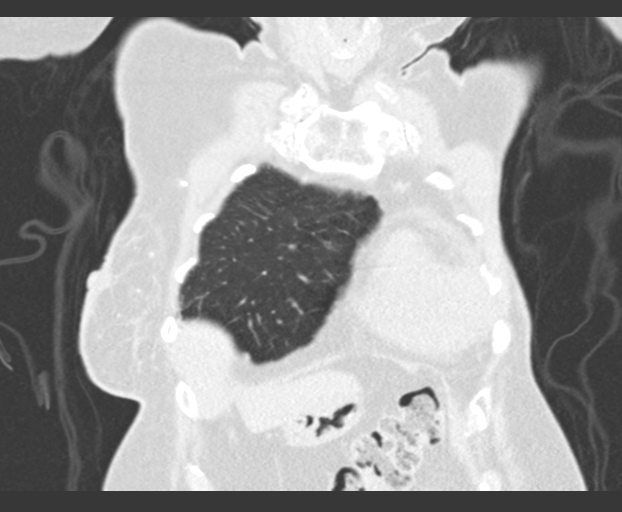
[im 57/142  lung]
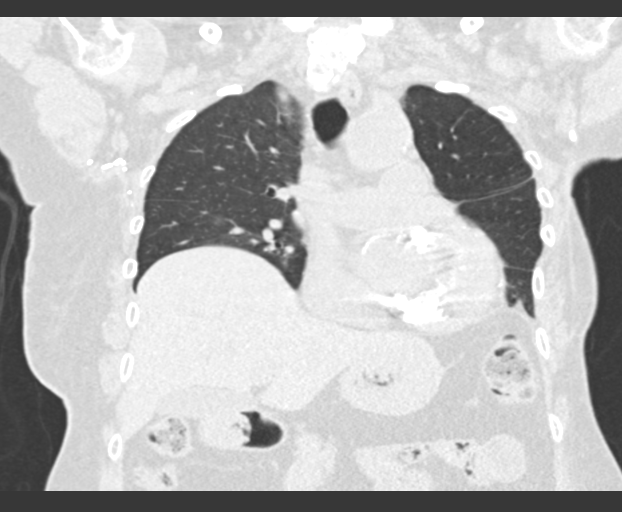
[im 85/142  lung]
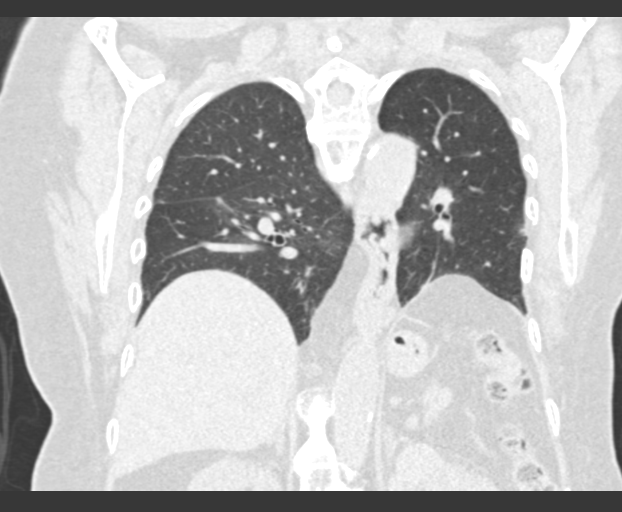

[15 of 36 positions shown; findings below may reference images not displayed]

FINDINGS: Cardiovascular: Aortic atherosclerosis. Normal heart size. Extensive
three-vessel coronary artery calcifications and stents. No
pericardial effusion.

Mediastinum/Nodes: No enlarged mediastinal, hilar, or axillary lymph
nodes. Small hiatal hernia. Thyroid gland, trachea, and esophagus
demonstrate no significant findings.

Lungs/Pleura: Redemonstrated postoperative findings of left upper
lobectomy. Previously seen ground-glass opacity in the superior
segment left lower lobe is resolved. No pleural effusion or
pneumothorax.

Upper Abdomen: No acute abnormality. Multiple lesions of the
bilateral kidneys, particularly hyperdense lesions of the superior
pole left kidney, previously characterized as hemorrhagic and
proteinaceous cysts by MR.

Musculoskeletal: No chest wall mass or suspicious bone lesions
identified. Status post left mastectomy.
IMPRESSION: 1. Redemonstrated postoperative findings of left upper lobectomy. No
evidence of malignant recurrence or metastatic disease in the chest.
2. Previously seen ground-glass opacity in the superior segment left
lower lobe is resolved, consistent with resolution of infection or
inflammation.
3. Coronary artery disease.

Aortic Atherosclerosis ([id number]).
